# Patient Record
Sex: Male | Born: 1960
Health system: Southern US, Community
[De-identification: ages and names within clinical notes are randomized; demographics above are authoritative.]

## PROBLEM LIST (undated history)

## (undated) DIAGNOSIS — J45909 Unspecified asthma, uncomplicated: Secondary | ICD-10-CM

## (undated) DIAGNOSIS — E039 Hypothyroidism, unspecified: Secondary | ICD-10-CM

## (undated) DIAGNOSIS — M1990 Unspecified osteoarthritis, unspecified site: Secondary | ICD-10-CM

## (undated) DIAGNOSIS — M109 Gout, unspecified: Secondary | ICD-10-CM

## (undated) DIAGNOSIS — IMO0002 Reserved for concepts with insufficient information to code with codable children: Secondary | ICD-10-CM

## (undated) DIAGNOSIS — E785 Hyperlipidemia, unspecified: Secondary | ICD-10-CM

## (undated) DIAGNOSIS — I2699 Other pulmonary embolism without acute cor pulmonale: Secondary | ICD-10-CM

## (undated) DIAGNOSIS — E1165 Type 2 diabetes mellitus with hyperglycemia: Secondary | ICD-10-CM

## (undated) DIAGNOSIS — I10 Essential (primary) hypertension: Secondary | ICD-10-CM

## (undated) DIAGNOSIS — I5032 Chronic diastolic (congestive) heart failure: Secondary | ICD-10-CM

## (undated) HISTORY — DX: Type 2 diabetes mellitus with hyperglycemia: E11.65

## (undated) HISTORY — DX: Morbid (severe) obesity due to excess calories: E66.01

## (undated) HISTORY — DX: Reserved for concepts with insufficient information to code with codable children: IMO0002

## (undated) HISTORY — DX: Hyperlipidemia, unspecified: E78.5

## (undated) HISTORY — DX: Essential (primary) hypertension: I10

---

## 1997-11-25 ENCOUNTER — Emergency Department (HOSPITAL_COMMUNITY): Admission: EM | Admit: 1997-11-25 | Discharge: 1997-11-25 | Payer: Self-pay | Admitting: Emergency Medicine

## 1998-01-07 ENCOUNTER — Emergency Department (HOSPITAL_COMMUNITY): Admission: EM | Admit: 1998-01-07 | Discharge: 1998-01-07 | Payer: Self-pay | Admitting: Emergency Medicine

## 1998-02-27 ENCOUNTER — Encounter: Admission: RE | Admit: 1998-02-27 | Discharge: 1998-05-28 | Payer: Self-pay | Admitting: Family Medicine

## 1998-04-02 ENCOUNTER — Encounter: Admission: RE | Admit: 1998-04-02 | Discharge: 1998-07-01 | Payer: Self-pay | Admitting: Family Medicine

## 1998-04-09 ENCOUNTER — Encounter: Payer: Self-pay | Admitting: Emergency Medicine

## 1998-04-09 ENCOUNTER — Emergency Department (HOSPITAL_COMMUNITY): Admission: EM | Admit: 1998-04-09 | Discharge: 1998-04-09 | Payer: Self-pay | Admitting: Emergency Medicine

## 1998-05-27 ENCOUNTER — Encounter: Admission: RE | Admit: 1998-05-27 | Discharge: 1998-08-25 | Payer: Self-pay | Admitting: Family Medicine

## 1998-07-30 ENCOUNTER — Encounter: Admission: RE | Admit: 1998-07-30 | Discharge: 1998-10-28 | Payer: Self-pay | Admitting: Family Medicine

## 1998-10-29 ENCOUNTER — Encounter: Admission: RE | Admit: 1998-10-29 | Discharge: 1999-01-27 | Payer: Self-pay | Admitting: Family Medicine

## 1999-02-05 ENCOUNTER — Encounter: Admission: RE | Admit: 1999-02-05 | Discharge: 1999-05-06 | Payer: Self-pay | Admitting: Family Medicine

## 1999-05-26 ENCOUNTER — Encounter: Admission: RE | Admit: 1999-05-26 | Discharge: 1999-08-24 | Payer: Self-pay | Admitting: Family Medicine

## 1999-06-06 ENCOUNTER — Emergency Department (HOSPITAL_COMMUNITY): Admission: EM | Admit: 1999-06-06 | Discharge: 1999-06-06 | Payer: Self-pay

## 1999-11-03 ENCOUNTER — Encounter: Admission: RE | Admit: 1999-11-03 | Discharge: 2000-02-01 | Payer: Self-pay | Admitting: Family Medicine

## 2001-04-30 ENCOUNTER — Emergency Department (HOSPITAL_COMMUNITY): Admission: EM | Admit: 2001-04-30 | Discharge: 2001-04-30 | Payer: Self-pay | Admitting: *Deleted

## 2002-01-20 ENCOUNTER — Emergency Department (HOSPITAL_COMMUNITY): Admission: EM | Admit: 2002-01-20 | Discharge: 2002-01-20 | Payer: Self-pay | Admitting: Emergency Medicine

## 2002-03-13 ENCOUNTER — Emergency Department (HOSPITAL_COMMUNITY): Admission: EM | Admit: 2002-03-13 | Discharge: 2002-03-13 | Payer: Self-pay | Admitting: Emergency Medicine

## 2002-03-13 ENCOUNTER — Encounter: Payer: Self-pay | Admitting: Emergency Medicine

## 2005-06-20 ENCOUNTER — Emergency Department (HOSPITAL_COMMUNITY): Admission: EM | Admit: 2005-06-20 | Discharge: 2005-06-20 | Payer: Self-pay | Admitting: Emergency Medicine

## 2006-09-13 ENCOUNTER — Emergency Department (HOSPITAL_COMMUNITY): Admission: EM | Admit: 2006-09-13 | Discharge: 2006-09-13 | Payer: Self-pay | Admitting: Emergency Medicine

## 2007-01-08 ENCOUNTER — Inpatient Hospital Stay (HOSPITAL_COMMUNITY): Admission: EM | Admit: 2007-01-08 | Discharge: 2007-01-16 | Payer: Self-pay | Admitting: Emergency Medicine

## 2007-01-10 ENCOUNTER — Ambulatory Visit: Payer: Self-pay | Admitting: Physical Medicine & Rehabilitation

## 2007-04-11 ENCOUNTER — Encounter: Admission: RE | Admit: 2007-04-11 | Discharge: 2007-07-10 | Payer: Self-pay | Admitting: *Deleted

## 2008-10-27 ENCOUNTER — Emergency Department (HOSPITAL_COMMUNITY): Admission: EM | Admit: 2008-10-27 | Discharge: 2008-10-27 | Payer: Self-pay | Admitting: Emergency Medicine

## 2008-11-21 ENCOUNTER — Ambulatory Visit: Payer: Self-pay | Admitting: Vascular Surgery

## 2008-11-21 ENCOUNTER — Encounter (INDEPENDENT_AMBULATORY_CARE_PROVIDER_SITE_OTHER): Payer: Self-pay | Admitting: Emergency Medicine

## 2008-11-21 ENCOUNTER — Emergency Department (HOSPITAL_COMMUNITY): Admission: EM | Admit: 2008-11-21 | Discharge: 2008-11-21 | Payer: Self-pay | Admitting: Emergency Medicine

## 2009-05-19 ENCOUNTER — Emergency Department (HOSPITAL_COMMUNITY): Admission: EM | Admit: 2009-05-19 | Discharge: 2009-05-19 | Payer: Self-pay | Admitting: Emergency Medicine

## 2010-09-23 LAB — BASIC METABOLIC PANEL
BUN: 18 mg/dL (ref 6–23)
CO2: 24 mEq/L (ref 19–32)
Calcium: 9.5 mg/dL (ref 8.4–10.5)
Chloride: 104 mEq/L (ref 96–112)
Creatinine, Ser: 0.8 mg/dL (ref 0.4–1.5)
GFR calc Af Amer: >60 mL/min (ref >60)
GFR calc non Af Amer: >60 mL/min (ref >60)
Glucose, Bld: 97 mg/dL (ref 70–99)
Potassium: 4.3 mEq/L (ref 3.5–5.1)
Sodium: 137 mEq/L (ref 135–145)

## 2010-09-28 LAB — POCT I-STAT, CHEM 8
BUN: 16 mg/dL (ref 6–23)
Creatinine, Ser: 1.2 mg/dL (ref 0.4–1.5)
Sodium: 140 mEq/L (ref 135–145)
TCO2: 24 mmol/L (ref 0–100)

## 2010-09-28 LAB — URINALYSIS, ROUTINE W REFLEX MICROSCOPIC
Hgb urine dipstick: NEGATIVE
Protein, ur: NEGATIVE mg/dL
Specific Gravity, Urine: 1.024 (ref 1.005–1.030)
Urobilinogen, UA: 2 mg/dL — ABNORMAL HIGH (ref 0.0–1.0)

## 2010-09-28 LAB — CBC
HCT: 41.5 % (ref 39.0–52.0)
Hemoglobin: 13.7 g/dL (ref 13.0–17.0)
MCHC: 33 g/dL (ref 30.0–36.0)
MCV: 94.3 fL (ref 78.0–100.0)
RBC: 4.4 MIL/uL (ref 4.22–5.81)

## 2010-09-28 LAB — DIFFERENTIAL
Basophils Relative: 0 % (ref 0–1)
Eosinophils Absolute: 0.3 10*3/uL (ref 0.0–0.7)
Eosinophils Relative: 3 % (ref 0–5)
Monocytes Absolute: 0.7 10*3/uL (ref 0.1–1.0)
Monocytes Relative: 9 % (ref 3–12)

## 2010-11-03 NOTE — Discharge Summary (Signed)
NAME:  Ronnie Hernandez, Ronnie Hernandez             ACCOUNT NO.:  [id number]   MEDICAL RECORD NO.:  [id number]          PATIENT TYPE:  INP   LOCATION:  1540                         FACILITY:  [facility]   PHYSICIAN:  Elliot Cousin, M.D.    DATE OF BIRTH:  27-Jul-1960   DATE OF ADMISSION:  01/08/2007  DATE OF DISCHARGE:  01/16/2007                               DISCHARGE SUMMARY   DISCHARGE DIAGNOSES:  1. Acute on chronic gout exacerbation/polyarthropathy.  2. Superimposed degenerative joint disease particularly of the lumbar      spine and the left shoulder.  3. Morbid obesity with a decrease in mobility/progressive      deconditioning.  4. Type 2 diabetes mellitus, diagnosed during this hospitalization.      The patient's hemoglobin A1C is 6.3.  5. Acute asthmatic bronchitis.  6. Mild normocytic anemia.   DISCHARGE MEDICATIONS:  1. Prednisone 25 mg daily for 2 more days, then 15 mg for 3 days then      10 mg daily for 2 days then 5 mg daily thereafter.  2. Colchicine 0.6 mg b.i.d.  3. Aspercreme to the left shoulder t.i.d. for 2 days and then daily      p.r.n. thereafter.  4. Glucophage 500 mg daily.  5. Percocet 5 mg b.i.d. and then every 4 hours as needed for pain.  6. Senokot S 1 tablet nightly.  7. Protonix 40 mg daily.  8. Multivitamin with iron once daily.  9. Tylenol 650 mg every 4 hours as needed for pain and fever.  10.Albuterol MDI 2 puffs every 4 hours p.r.n.   CONSULTATIONS:  1. Rehabilitation Medicine physician, Dr. Thersa Salt.  2. Physical and Occupational Therapy.  3. Social Work Haematologist.   PROCEDURES PERFORMED:  X-ray of the left shoulder on 01/09/2007.  The  results revealed no acute skeletal abnormalities.  Degenerative changes  in the acromioclavicular joint.   HISTORY OF PRESENT ILLNESS:  The patient is a 50 year old man with a  past medical history significant for gout, asthma, and arthritis.  He  presented to the emergency department on 01/08/2007 with a chief  complaint of  diffuse musculoskeletal and joint pain, particularly of his  right knee, right foot, right ankle, left elbow, left hand and left  shoulder.  He attempted treatment with Naprosyn without any significant  relief.  Because of the pain, he had been virtually bedridden for  approximately 2 weeks prior to the hospitalization.  When he was  evaluated in the emergency department his uric acid level was elevated  at 7.8.  He was, therefore, admitted for further evaluation and  management.   For additional details, please see the dictated history and physical.   HOSPITAL COURSE:  1. ACUTE ON CHRONIC GOUT EXACERBATION SUPERIMPOSED ON CHRONIC      DEGENERATIVE JOINT DISEASE.  The patient was started on pain      management with colchicine 0.6 mg every 2 hours x5 doses and then      b.i.d. thereafter.  He was also started on Naprosyn 5 mg b.i.d. x3      doses.  As needed relief was provided  with oxycodone, Tylenol and      Dilaudid.  Over the course of the hospitalization, the patient's      pain and inflammation subsided.  However, he continued to have more      pain in his left hand and his left shoulder.  Given this complaint,      an x-ray of the left shoulder was ordered.  It revealed      degenerative changes but no acute findings.  Given the persistence      of the left shoulder and left hand pain, a prednisone taper was      started.  Approximately 24 hours after the prednisone taper was      started, the patient's pain in the left hand completely resolved.      Aspercreme to be applied t.i.d. for 3 days was added as well.  Over      the past 24 hours, he has had an increase in the range of motion of      the left shoulder.  For ongoing treatment of his gout and      degenerative joint disease, he will be discharged on colchicine 0.6      mg b.i.d., a prednisone taper down to 5 mg daily indefinitely, and      Aspercreme to the left shoulder p.r.n.  The patient will also be      chronically  treated with Percocet b.i.d. and every 4 hours as      needed for breakthrough pain.  2. DECONDITIONING/LIMITED MOBILITY SECONDARY TO MORBID OBESITY AND      POLYARTHROPATHY.  As stated above, the patient had been virtually      bedridden for approximately 2 weeks prior to the hospitalization.      For further evaluation, the physical and occupational therapists      were consulted.  They evaluated the patient and felt that he may      possibly be a good rehab candidate for the rehab facility at [facility].  Therefore, rehabilitation physician, Dr. Hermelinda Medicus,      was consulted.  He provided his consultation on 01/10/2007.  Per      his assessment, he would consider taking the patient as a rehab      candidate if he progressed over the next few days following his      assessment.  However, over the few days following, the patient's      mobility and progression have been somewhat slow.  He is able to      stand a little with the assistance of the therapist and a walker.      As an alternative, the patient was given the option of placement in      a skilled nursing facility, short term, for further rehabilitation      and strengthening.  The patient was very accepting of the idea.      [facility] (skilled nursing facility) showed some interest      in the patient.  The final acceptance is pending, however.  If he      is accepted, the patient will be discharged there for ongoing      physical and occupational therapy.  3. TYPE 2 DIABETES MELLITUS (NEWLY DIAGNOSED).  The patient's venous      glucose was found to be mildly elevated during the hospitalization.      His venous glucose generally ranged from 140-150 during the  hospitalization.  A hemoglobin A1C was ordered and was found to be      slightly elevated at 6.3.  The patient had no known prior history      of diabetes mellitus.  However, given the persistence of the      hyperglycemia, Glucophage at 500 mg  daily was started.  The patient      was also treated with a sliding scale insulin regimen with NovoLog.      However, he probably will not need insulin treatment at this point.      The Glucophage can certainly be titrated up to 1000 mg or 500 mg      b.i.d.  However, he appears to be relatively well controlled on 500      mg of Glucophage daily.  The registered dietitian was consulted and      provided the patient with information regarding weight loss and a      diabetic diet and lifestyle. The patient was also educated on self      glucose monitoring.  4. MILD EXACERBATION OF ASTHMATIC BRONCHITIS.  The patient was started      on as-needed Albuterol MDI and doxycycline empirically.  Over the      past 2-3 days, he has had no wheezes and no other pulmonary      symptoms.  He has been completely afebrile during the hospital      course.   DISCHARGE DISPOSITION:  The patient is currently stable and in improved  condition with regard to acute gout symptoms.  He is currently  deconditioned and has a significant decrease in his mobility.  The plan  is to transfer him to a skilled nursing facility for ongoing  rehabilitation.   DISCHARGE LABORATORIES:  TSH 5.4.  Sodium 136, potassium 3.9, chloride  100, CO2 27, glucose 150, BUN 25, creatinine 0.8, calcium 9.1.  WBC  14.5, hemoglobin 13.1, hematocrit 38.6, platelet count 478.   Reticulocyte count 44, total iron 56, total iron binding capacity 190,  vitamin B12 338, folate 6.7 and ferritin 905.      Elliot Cousin, M.D.  Electronically Signed     DF/MEDQ  D:  01/15/2007  T:  01/15/2007  Job:  [id number]

## 2010-11-03 NOTE — H&P (Signed)
NAME:  FARON, TUDISCO NO.:  [id number]   MEDICAL RECORD NO.:  [id number]          PATIENT TYPE:  INP   LOCATION:  1540                         FACILITY:  [facility]   PHYSICIAN:  Hettie Holstein, D.O.    DATE OF BIRTH:  03-28-1961   DATE OF ADMISSION:  01/08/2007  DATE OF DISCHARGE:                              HISTORY & PHYSICAL   PRIMARY CARE PHYSICIAN:  Unassigned.   CHIEF COMPLAINT:  Severe joint pains.   HISTORY OF PRESENTING ILLNESS:  Mr. Brame is a 50 year old morbidly  obese male with significant past medical history for gout and a similar  presentation of the gouty flare in the past, with similar constellation  of complaints this admission.  He states that he began having symptoms  approximately for 2 weeks that began in his left arm and had some  improvement with his typical nonsteroidals that he takes; however, he  subsequently developed a pain in his right knee, right foot and ankle  with some involvement of his left elbow and he states this is very  similar to previous course and bout with gout.  He, as noted above,  attempted Naprosyn without any significant relief.  He has essentially  been a confined to his bed for almost 2 weeks now.  He is tended to by  his niece, Glee Arvin, reachable at [phone].  His uric acid level in the  emergency room was elevated at 7.8.  He typically takes Naprosyn at  home.  He does not have colchicine or allopurinol.   PAST MEDICAL HISTORY:  1. This is significant for bouts of gout in the past and history of      morbid obesity.  He had been ambulatory up until about 2 weeks ago      and has been depending on his family for activities of daily      living.  2. History of arthritis.  3. Asthma.   MEDICATIONS:  He has no prescribed medications; he only takes over-the-  counter Naprosyn, albuterol and Tylenol.   ALLERGIES:  He has no known drug allergies.   SOCIAL HISTORY:  He denies tobacco or alcohol.  He states that  he has  been on disability.  He lives with his niece, as his primary care  person.  His family is reachable at [phone] or [phone].   FAMILY HISTORY:  Noncontributory.   REVIEW OF SYSTEMS:  Apparently, according to Mr. Fogelman, he had been in  his usual state of health up until about 2 weeks ago.  He is able  transport from home to car via a personal scooter-type vehicle.  In any  event, he has not had any complaints of nausea, vomiting or diarrhea.  He denies chest pain or discomfort.  He has had multiple joint  complaints as described above.  No blood in his stools.  No hematemesis  or hematochezia.   PHYSICAL EXAMINATION:  VITAL SIGNS:  In the emergency department, his  blood pressure was 160/67, temperature 97, heart rate 98, respirations  20.  O2 saturation is 98%.  GENERAL:  Mr. Rudd does not  appear to be in acute respiratory  distress.  He is quite uncomfortable-appearing.  He is morbidly obese.  HEENT:  Reveals head to be normocephalic, atraumatic.  Extraocular  muscles are intact.  NECK:  Supple and nontender.  No palpable thyromegaly or mass.  CARDIOVASCULAR:  Exam reveals normal S1 and S2, without appreciable  murmur.  LUNGS:  He exhibited normal effort.  There were breath sounds  bilaterally, though auscultation was limited due to massive body  habitus.  ABDOMEN:  This is obese.  He does have a large pannus with areas of  redness where there were adjacent skin surfaces.  LOWER EXTREMITIES:  He  has tenderness with joint manipulation of his right lower extremity at  the ankle and knee.  He does have some tenderness with respect to his  left wrist and left shoulder.  He appears to have a swan-neck-type  deformity in his fingers.   LABORATORY DATA:  His urinalysis is negative.  BNP is less than 30.  Uric acid was 7.8.  Sodium is 136, potassium 5.1, BUN 20, creatinine  1.12, calcium 9.1.  WBC of 6.8, hemoglobin of 13.8 and platelet count of  445,000.   ASSESSMENT:   1. Polyarthropathy/__________ arthropathy attributable to an acute      gouty flare that is consistent with a previous constellation of      symptoms associated with prior bout gout.  2. Leukocytosis.  3. Morbid obesity.  4. Hyperglycemia.   PLAN:  At this time, Mr. Tiedt will be admitted for symptom management  and we will initiate colchicine as well as nonsteroidal anti-  inflammatories and narcotic analgesia, if his pain necessitates.  Otherwise, we will obtain a nutrition consult to assist in moderation  and management with respect to his diet.      Hettie Holstein, D.O.  Electronically Signed     ESS/MEDQ  D:  01/08/2007  T:  01/09/2007  Job:  [id number]

## 2011-04-05 LAB — IRON AND TIBC
Iron: 56
Saturation Ratios: 29
TIBC: 190 — ABNORMAL LOW
UIBC: 134

## 2011-04-05 LAB — CBC
HCT: 34.1 — ABNORMAL LOW
HCT: 38.6 — ABNORMAL LOW
Hemoglobin: 11.7 — ABNORMAL LOW
MCHC: 33.5
MCHC: 34.2
MCV: 88
MCV: 89.5
Platelets: 377
Platelets: 445 — ABNORMAL HIGH
Platelets: 455 — ABNORMAL HIGH
RBC: 3.87 — ABNORMAL LOW
RBC: 3.87 — ABNORMAL LOW
RBC: 4.31
RDW: 14
WBC: 13.1 — ABNORMAL HIGH
WBC: 14.5 — ABNORMAL HIGH
WBC: 16.8 — ABNORMAL HIGH
WBC: 9

## 2011-04-05 LAB — BASIC METABOLIC PANEL
BUN: 19
BUN: 20
CO2: 29
Calcium: 9
Calcium: 9.1
Chloride: 100
Creatinine, Ser: 0.77
Creatinine, Ser: 1.12
GFR calc Af Amer: >60
GFR calc Af Amer: >60
GFR calc non Af Amer: >60
GFR calc non Af Amer: >60
Potassium: 3.9
Potassium: 5.1
Sodium: 136

## 2011-04-05 LAB — URINE MICROSCOPIC-ADD ON

## 2011-04-05 LAB — BASIC METABOLIC PANEL WITH GFR
BUN: 18
CO2: 24
Calcium: 9
Chloride: 101
Creatinine, Ser: 0.64
GFR calc Af Amer: >60
GFR calc non Af Amer: >60
Glucose, Bld: 154 — ABNORMAL HIGH
Potassium: 4.3
Sodium: 135

## 2011-04-05 LAB — TSH: TSH: 5.485

## 2011-04-05 LAB — URINALYSIS, ROUTINE W REFLEX MICROSCOPIC
Glucose, UA: NEGATIVE
Protein, ur: 30 — AB

## 2011-04-05 LAB — DIFFERENTIAL
Lymphocytes Relative: 7 — ABNORMAL LOW
Lymphs Abs: 1.2
Neutrophils Relative %: 85 — ABNORMAL HIGH

## 2011-04-05 LAB — HEPARIN ANTI-XA: Heparin LMW: 0.45

## 2011-04-05 LAB — FERRITIN: Ferritin: 905 — ABNORMAL HIGH (ref 22–322)

## 2011-04-05 LAB — URIC ACID: Uric Acid, Serum: 7.8 — ABNORMAL HIGH

## 2011-04-05 LAB — HEMOGLOBIN A1C: Hgb A1c MFr Bld: 6.3 — ABNORMAL HIGH

## 2011-04-05 LAB — VITAMIN B12: Vitamin B-12: 338 (ref 211–911)

## 2011-04-05 LAB — FOLATE: Folate: 6.7

## 2011-04-05 LAB — B-NATRIURETIC PEPTIDE (CONVERTED LAB): Pro B Natriuretic peptide (BNP): <30

## 2011-04-05 LAB — RETICULOCYTES: Retic Count, Absolute: 44

## 2011-05-31 ENCOUNTER — Encounter: Payer: Self-pay | Admitting: Internal Medicine

## 2011-06-23 ENCOUNTER — Ambulatory Visit (AMBULATORY_SURGERY_CENTER): Payer: Medicare Other | Admitting: *Deleted

## 2011-06-23 VITALS — Ht 68.0 in | Wt >= 6400 oz

## 2011-06-23 DIAGNOSIS — Z1211 Encounter for screening for malignant neoplasm of colon: Secondary | ICD-10-CM

## 2011-06-23 MED ORDER — PEG-KCL-NACL-NASULF-NA ASC-C 100 G PO SOLR: ORAL | Status: DC

## 2011-06-23 NOTE — Progress Notes (Signed)
Pt weighs 412 lb.  Will be rescheduled to hospital.  Dr Rhea Belton wants pt to be on 2 days clear liquids before procedure.  Pt given instructions for 2 days of clear liquids and MoviPrep day before and day of procedure.  Ezra Sites'

## 2011-07-07 ENCOUNTER — Other Ambulatory Visit: Payer: Self-pay | Admitting: Internal Medicine

## 2011-07-23 HISTORY — PX: COLONOSCOPY W/ BIOPSIES AND POLYPECTOMY: SHX1376

## 2011-07-26 ENCOUNTER — Other Ambulatory Visit: Payer: Self-pay | Admitting: Gastroenterology

## 2011-07-26 ENCOUNTER — Telehealth: Payer: Self-pay | Admitting: Internal Medicine

## 2011-07-26 ENCOUNTER — Other Ambulatory Visit: Payer: Self-pay

## 2011-07-26 ENCOUNTER — Ambulatory Visit (HOSPITAL_COMMUNITY)
Admission: RE | Admit: 2011-07-26 | Discharge: 2011-07-26 | Disposition: A | Discharge status: Home / Self Care | Payer: Medicare Other | Source: Ambulatory Visit | Attending: Internal Medicine | Admitting: Internal Medicine

## 2011-07-26 ENCOUNTER — Encounter (HOSPITAL_COMMUNITY): Payer: Self-pay

## 2011-07-26 DIAGNOSIS — Z1211 Encounter for screening for malignant neoplasm of colon: Secondary | ICD-10-CM

## 2011-07-26 DIAGNOSIS — I10 Essential (primary) hypertension: Secondary | ICD-10-CM | POA: Insufficient documentation

## 2011-07-26 DIAGNOSIS — E785 Hyperlipidemia, unspecified: Secondary | ICD-10-CM | POA: Insufficient documentation

## 2011-07-26 DIAGNOSIS — Z01810 Encounter for preprocedural cardiovascular examination: Secondary | ICD-10-CM | POA: Insufficient documentation

## 2011-07-26 DIAGNOSIS — E119 Type 2 diabetes mellitus without complications: Secondary | ICD-10-CM | POA: Insufficient documentation

## 2011-07-26 HISTORY — DX: Unspecified osteoarthritis, unspecified site: M19.90

## 2011-07-26 MED ORDER — PEG-KCL-NACL-NASULF-NA ASC-C 100 G PO SOLR: ORAL | Status: DC

## 2011-07-26 NOTE — Telephone Encounter (Signed)
Tried calling pt to instruct him on his prep. Phone number has been temporarily disconnected.

## 2011-07-26 NOTE — Anesthesia Preprocedure Evaluation (Addendum)
Anesthesia Evaluation  Patient identified by MRN, date of birth, ID band Patient awake    Reviewed: Allergy & Precautions, H&P , NPO status , Patient's Chart, lab work & pertinent test results  Airway Mallampati: III TM Distance: >3 FB Neck ROM: Full    Dental No notable dental hx.    Pulmonary neg pulmonary ROS,  clear to auscultation  Pulmonary exam normal       Cardiovascular hypertension, Pt. on medications Regular Normal    Neuro/Psych Negative Neurological ROS  Negative Psych ROS   GI/Hepatic negative GI ROS, Neg liver ROS,   Endo/Other  Diabetes mellitus-, Type 2, Oral Hypoglycemic AgentsMorbid obesity  Renal/GU negative Renal ROS  Genitourinary negative   Musculoskeletal negative musculoskeletal ROS (+)   Abdominal   Peds negative pediatric ROS (+)  Hematology negative hematology ROS (+)   Anesthesia Other Findings   Reproductive/Obstetrics negative OB ROS                           Anesthesia Physical Anesthesia Plan  ASA: III  Anesthesia Plan: MAC   Post-op Pain Management:    Induction: Intravenous  Airway Management Planned: Simple Face Mask  Additional Equipment:   Intra-op Plan:   Post-operative Plan:   Informed Consent: I have reviewed the patients History and Physical, chart, labs and discussed the procedure including the risks, benefits and alternatives for the proposed anesthesia with the patient or authorized representative who has indicated his/her understanding and acceptance.   Dental advisory given  Plan Discussed with: CRNA  Anesthesia Plan Comments:         Anesthesia Quick Evaluation

## 2011-07-27 ENCOUNTER — Encounter (HOSPITAL_COMMUNITY): Payer: Self-pay | Admitting: Internal Medicine

## 2011-07-27 ENCOUNTER — Encounter (HOSPITAL_COMMUNITY): Payer: Self-pay | Admitting: Anesthesiology

## 2011-07-27 ENCOUNTER — Ambulatory Visit (HOSPITAL_COMMUNITY): Payer: Medicare Other | Admitting: Anesthesiology

## 2011-07-27 ENCOUNTER — Encounter (HOSPITAL_COMMUNITY): Payer: Self-pay | Admitting: *Deleted

## 2011-07-27 ENCOUNTER — Ambulatory Visit (HOSPITAL_COMMUNITY)
Admission: RE | Admit: 2011-07-27 | Discharge: 2011-07-27 | Disposition: A | Discharge status: Home / Self Care | Payer: Medicare Other | Source: Ambulatory Visit | Attending: Internal Medicine | Admitting: Internal Medicine

## 2011-07-27 ENCOUNTER — Other Ambulatory Visit: Payer: Self-pay | Admitting: Internal Medicine

## 2011-07-27 ENCOUNTER — Encounter (HOSPITAL_COMMUNITY)
Admission: RE | Disposition: A | Discharge status: Home / Self Care | Payer: Self-pay | Source: Ambulatory Visit | Attending: Internal Medicine

## 2011-07-27 DIAGNOSIS — I10 Essential (primary) hypertension: Secondary | ICD-10-CM | POA: Insufficient documentation

## 2011-07-27 DIAGNOSIS — D371 Neoplasm of uncertain behavior of stomach: Secondary | ICD-10-CM | POA: Insufficient documentation

## 2011-07-27 DIAGNOSIS — E785 Hyperlipidemia, unspecified: Secondary | ICD-10-CM | POA: Insufficient documentation

## 2011-07-27 DIAGNOSIS — E119 Type 2 diabetes mellitus without complications: Secondary | ICD-10-CM | POA: Insufficient documentation

## 2011-07-27 DIAGNOSIS — D375 Neoplasm of uncertain behavior of rectum: Secondary | ICD-10-CM | POA: Insufficient documentation

## 2011-07-27 DIAGNOSIS — D126 Benign neoplasm of colon, unspecified: Secondary | ICD-10-CM

## 2011-07-27 DIAGNOSIS — Z1211 Encounter for screening for malignant neoplasm of colon: Secondary | ICD-10-CM | POA: Insufficient documentation

## 2011-07-27 SURGERY — COLONOSCOPY WITH PROPOFOL
Anesthesia: Monitor Anesthesia Care

## 2011-07-27 MED ORDER — LACTATED RINGERS IV SOLN
INTRAVENOUS | Status: DC
  Administered 2011-07-27: 09:00:00 via INTRAVENOUS

## 2011-07-27 MED ORDER — PROPOFOL 10 MG/ML IV EMUL
INTRAVENOUS | Status: DC | PRN
  Administered 2011-07-27: 50 ug/kg/min via INTRAVENOUS

## 2011-07-27 MED ORDER — MIDAZOLAM HCL 5 MG/5ML IJ SOLN
INTRAMUSCULAR | Status: DC | PRN
Start: 2011-07-27 — End: 2011-07-27
  Administered 2011-07-27 (×3): 1 mg via INTRAVENOUS

## 2011-07-27 MED ORDER — PROMETHAZINE HCL 25 MG/ML IJ SOLN: 6.2500 mg | INTRAMUSCULAR | Status: DC | PRN

## 2011-07-27 MED ORDER — METOCLOPRAMIDE HCL 5 MG/ML IJ SOLN
INTRAMUSCULAR | Status: DC | PRN
  Administered 2011-07-27: 5 mg via INTRAVENOUS

## 2011-07-27 MED ORDER — FENTANYL CITRATE 0.05 MG/ML IJ SOLN
INTRAMUSCULAR | Status: DC | PRN
  Administered 2011-07-27 (×3): 50 ug via INTRAVENOUS

## 2011-07-27 MED ORDER — MEPERIDINE HCL 25 MG/ML IJ SOLN: 6.2500 mg | INTRAMUSCULAR | Status: DC | PRN

## 2011-07-27 NOTE — Anesthesia Postprocedure Evaluation (Signed)
  Anesthesia Post-op Note  Patient: Ronnie Hernandez  Procedure(s) Performed:  COLONOSCOPY WITH PROPOFOL  Patient Location: PACU  Anesthesia Type: MAC  Level of Consciousness: awake and alert   Airway and Oxygen Therapy: Patient Spontanous Breathing  Post-op Pain: mild  Post-op Assessment: Post-op Vital signs reviewed, Patient's Cardiovascular Status Stable, Respiratory Function Stable, Patent Airway and No signs of Nausea or vomiting  Post-op Vital Signs: stable  Complications: No apparent anesthesia complications

## 2011-07-27 NOTE — Transfer of Care (Signed)
Immediate Anesthesia Transfer of Care Note  Patient: Ronnie Hernandez  Procedure(s) Performed:  COLONOSCOPY WITH PROPOFOL  Patient Location: PACU  Anesthesia Type: MAC  Level of Consciousness: awake, alert , oriented and patient cooperative  Airway & Oxygen Therapy: Patient Spontanous Breathing and Patient connected to face mask oxygen  Post-op Assessment: Report given to PACU RN and Post -op Vital signs reviewed and stable  Post vital signs: Reviewed and stable  Complications: No apparent anesthesia complications

## 2011-07-27 NOTE — Transfer of Care (Signed)
Immediate Anesthesia Transfer of Care Note  Patient: Ronnie Hernandez  Procedure(s) Performed:  COLONOSCOPY WITH PROPOFOL  Patient Location: PACU  Anesthesia Type: MAC  Level of Consciousness: awake, alert , oriented and patient cooperative  Airway & Oxygen Therapy: Patient Spontanous Breathing and Patient connected to face mask oxygen  Post-op Assessment: Report given to PACU RN, Post -op Vital signs reviewed and stable and Patient moving all extremities  Post vital signs: Reviewed and stable  Complications: No apparent anesthesia complications

## 2011-07-27 NOTE — H&P (Signed)
  HPI: Ronnie Hernandez is a 51 yo male with PMH of morbid obesity, HTN, DM who presents to [facility] for screening colonoscopy.  No complaints today.  No abd pain, n/v.  No f/c/ns.  Past Medical History  Diagnosis Date  . Hypertension   . Hyperlipidemia   . Diabetes mellitus   . Morbidly obese   . Arthritis     right fingers, both knees   Current Facility-Administered Medications  Medication Dose Route Frequency Provider Last Rate Last Dose  . lactated ringers infusion   Intravenous Continuous Phillips Grout, MD 125 mL/hr at 07/27/11 [phone]    . meperidine (DEMEROL) injection 6.25-12.5 mg  6.25-12.5 mg Intravenous Q5 min PRN Phillips Grout, MD      . promethazine (PHENERGAN) injection 6.25-12.5 mg  6.25-12.5 mg Intravenous Q15 min PRN Phillips Grout, MD       No Known Allergies  Family History  Problem Relation Age of Onset  . Stomach cancer Maternal Aunt 80  . Colon cancer Maternal Grandfather 45  . Heart attack Father    History   Social History  . Marital Status: Single    Spouse Name: N/A    Number of Children: N/A  . Years of Education: N/A   Social History Main Topics  . Smoking status: Former Smoker    Quit date: 02/09/1982  . Smokeless tobacco: Never Used  . Alcohol Use: No  . Drug Use: No  . Sexually Active: None   Other Topics Concern  . None   Social History Narrative  . None    ROS: as per HPI,otherwise neg  BP 135/86  Temp(Src) 97.8 F (36.6 C) (Oral)  Resp 20  SpO2 97% Gen: awake, alert, NAD HEENT: anicteric, op clear CV: RRR, no mrg Pulm: CTA b/l, distant Abd: soft, obese, NT/ND, +BS throughout JWJ:XBJYN pretib edema, no c/c, changes of arthritis b/l hands Neuro: nonfocal  [name] with PMH of morbid obesity, HTN, DM who presents to [facility] for screening colonoscopy  1. CRC screening -- plan colonoscopy today with MAC sedation.

## 2011-07-27 NOTE — Op Note (Signed)
[facility] [address] [place], [place]  [address]  COLONOSCOPY PROCEDURE REPORT  PATIENT:  Ronnie Hernandez, Ronnie Hernandez  MR#:  [id number] BIRTHDATE:  1961/03/15, 50 yrs. old  GENDER:  male ENDOSCOPIST:  Carie Caddy. Romy Mcgue, MD REF. BY:  Baltazar Najjar, M.D. PROCEDURE DATE:  07/27/2011 PROCEDURE:  Colonoscopy with snare polypectomy, Colon with cold biopsy polypectomy ASA CLASS:  Class II INDICATIONS:  Routine Risk Screening, 1st colonoscopy MEDICATIONS:   MAC sedation, administered by CRNA  DESCRIPTION OF PROCEDURE:   After the risks benefits and alternatives of the procedure were thoroughly explained, informed consent was obtained.  Digital rectal exam was performed and revealed no rectal masses.   The [id number] and [id number]) endoscope was introduced through the anus and advanced to the cecum, which was identified by both the appendix and ileocecal valve, without limitations.  The quality of the prep was good, using MoviPrep.  The instrument was then slowly withdrawn as the colon was fully examined. <<PROCEDUREIMAGES>> FINDINGS:  A  5 mm sessile polyp was found in the cecum. Polyp was snared without cautery. Retrieval was successful.   A 2 mm sessile polyp was found in the cecum. The polyp was removed using cold biopsy forceps.  This was otherwise a normal examination of the colon.   Retroflexed views in the rectum revealed no abnormalities.   The scope was then withdrawn from the cecum and the procedure completed.  COMPLICATIONS:  None  ENDOSCOPIC IMPRESSION: 1) Sessile polyp in the cecum.  Removed and sent to pathology. 2) Sessile polyp in the cecum. Removed and sent to pathology. 3) Otherwise normal examination  RECOMMENDATIONS: 1) Await pathology results 2) If the polyps removed today are proven to be adenomatous (pre-cancerous) polyps, you will need a repeat colonoscopy in 5 years. Otherwise you should continue to follow colorectal cancer screening guidelines  for "routine risk" patients with colonoscopy in 10 years. 3) You will receive a letter within 1-2 weeks with the results of your biopsy as well as final recommendations. Please call my office if you have not received a letter after 3 weeks.  Carie Caddy. Rhea Belton, MD  CC:  Baltazar Najjar, MD The Patient  n. eSIGNED:   Carie Caddy. Birch Farino at 07/27/2011 09:34 AM  Mosetta Putt, [phone]

## 2011-07-27 NOTE — Preoperative (Signed)
Beta Blockers   Reason not to administer Beta Blockers:Not Applicable 

## 2011-07-28 ENCOUNTER — Encounter: Payer: Self-pay | Admitting: Internal Medicine

## 2012-11-15 ENCOUNTER — Emergency Department (HOSPITAL_COMMUNITY)
Admission: EM | Admit: 2012-11-15 | Discharge: 2012-11-15 | Disposition: A | Discharge status: Home / Self Care | Payer: Medicare Other | Attending: Emergency Medicine | Admitting: Emergency Medicine

## 2012-11-15 ENCOUNTER — Encounter (HOSPITAL_COMMUNITY): Payer: Self-pay | Admitting: Emergency Medicine

## 2012-11-15 DIAGNOSIS — M7989 Other specified soft tissue disorders: Secondary | ICD-10-CM | POA: Insufficient documentation

## 2012-11-15 DIAGNOSIS — E119 Type 2 diabetes mellitus without complications: Secondary | ICD-10-CM | POA: Insufficient documentation

## 2012-11-15 DIAGNOSIS — Z8639 Personal history of other endocrine, nutritional and metabolic disease: Secondary | ICD-10-CM | POA: Insufficient documentation

## 2012-11-15 DIAGNOSIS — R110 Nausea: Secondary | ICD-10-CM | POA: Insufficient documentation

## 2012-11-15 DIAGNOSIS — I10 Essential (primary) hypertension: Secondary | ICD-10-CM | POA: Insufficient documentation

## 2012-11-15 DIAGNOSIS — Z79899 Other long term (current) drug therapy: Secondary | ICD-10-CM | POA: Insufficient documentation

## 2012-11-15 DIAGNOSIS — Z862 Personal history of diseases of the blood and blood-forming organs and certain disorders involving the immune mechanism: Secondary | ICD-10-CM | POA: Insufficient documentation

## 2012-11-15 DIAGNOSIS — Z8739 Personal history of other diseases of the musculoskeletal system and connective tissue: Secondary | ICD-10-CM | POA: Insufficient documentation

## 2012-11-15 DIAGNOSIS — R197 Diarrhea, unspecified: Secondary | ICD-10-CM | POA: Insufficient documentation

## 2012-11-15 DIAGNOSIS — Z87891 Personal history of nicotine dependence: Secondary | ICD-10-CM | POA: Insufficient documentation

## 2012-11-15 DIAGNOSIS — M79609 Pain in unspecified limb: Secondary | ICD-10-CM | POA: Insufficient documentation

## 2012-11-15 LAB — URINE MICROSCOPIC-ADD ON

## 2012-11-15 LAB — CBC WITH DIFFERENTIAL/PLATELET
Basophils Relative: 0 % (ref 0–1)
Eosinophils Absolute: 0.2 10*3/uL (ref 0.0–0.7)
Eosinophils Relative: 2 % (ref 0–5)
Lymphs Abs: 2.2 10*3/uL (ref 0.7–4.0)
MCH: 30.8 pg (ref 26.0–34.0)
MCHC: 34.1 g/dL (ref 30.0–36.0)
MCV: 90.3 fL (ref 78.0–100.0)
Platelets: 176 10*3/uL (ref 150–400)
RBC: 4.64 MIL/uL (ref 4.22–5.81)
RDW: 12.8 % (ref 11.5–15.5)

## 2012-11-15 LAB — COMPREHENSIVE METABOLIC PANEL
ALT: 32 U/L (ref 0–53)
Calcium: 9.1 mg/dL (ref 8.4–10.5)
GFR calc Af Amer: 84 mL/min — ABNORMAL LOW (ref >90)
Glucose, Bld: 103 mg/dL — ABNORMAL HIGH (ref 70–99)
Sodium: 135 mEq/L (ref 135–145)
Total Protein: 7.2 g/dL (ref 6.0–8.3)

## 2012-11-15 LAB — URINALYSIS, ROUTINE W REFLEX MICROSCOPIC
Nitrite: NEGATIVE
Specific Gravity, Urine: 1.013 (ref 1.005–1.030)
pH: 5.5 (ref 5.0–8.0)

## 2012-11-15 MED ORDER — HYDROMORPHONE HCL PF 1 MG/ML IJ SOLN
1.0000 mg | Freq: Once | INTRAMUSCULAR | Status: AC
  Administered 2012-11-15: 1 mg via INTRAMUSCULAR
  Filled 2012-11-15: qty 1

## 2012-11-15 MED ORDER — KETOROLAC TROMETHAMINE 60 MG/2ML IM SOLN
60.0000 mg | Freq: Once | INTRAMUSCULAR | Status: AC
  Administered 2012-11-15: 60 mg via INTRAMUSCULAR
  Filled 2012-11-15: qty 2

## 2012-11-15 MED ORDER — IBUPROFEN 600 MG PO TABS: 600.0000 mg | ORAL_TABLET | Freq: Four times a day (QID) | ORAL | Status: DC | PRN

## 2012-11-15 NOTE — ED Provider Notes (Signed)
Complains of right hand pain for 4 days. Cone by swelling also complains of nausea and diarrhea no vomiting states hand pain is similar to down he episodes he's had in the past. Denies abdominal pain. Denies fever exam alert nontoxic not acutely ill appearing lungs clear auscultation heart regular rate and rhythm abdomen morbidly obese nontender bilateral lower extremities with 1+ pretibial pitting edema right upper extremity swollen dorsum of hand with redness, minimal tenderness. Not warm. No red cheeks up arm no axillary nodes. Radial pulse 2+. Assessment patient states hand is improving with time. Doubt cellulitis or septic joint  Doug Sou, MD 11/15/12 2100

## 2012-11-15 NOTE — ED Provider Notes (Signed)
History     CSN: [id number]  Arrival date & time 11/15/12  1814   First MD Initiated Contact with Patient 11/15/12 1840      Chief Complaint  Patient presents with  . Hand Pain  . Abdominal Pain  . Diarrhea    HPI 52 year old male with a history of morbid obesity (weight more than 400 pounds), diabetes, and gout as well as a degenerative arthritis presents complaining of right hand pain. Pain in his right hand started about 4 days ago. There is extensive soft tissue swelling of his entire right hand and his right distal forearm. He has noted some erythema to the affected area. He states that it started in his right fifth finger at approximately the MCP joint. It is gradually spread from there. Pain is severe, 8/10 in severity. The pain is aggravated by palpation.  It is relieved by being still and relieved somewhat by anti-inflammatory drugs. He states it is identical to prior gout flares that he has had. He states he's had arthrocentesis proven gout in the past affecting the same joints. He denies any fever, chills, nausea or other systemic symptoms.  On ROS he also has mild, cramping abdominal pain and a few episodes of loose stools. This has been ongoing for 2 days and his symptoms are mild and not why he came to the emergency department today.   Past Medical History  Diagnosis Date  . Hypertension   . Hyperlipidemia   . Diabetes mellitus   . Morbidly obese   . Arthritis     right fingers, both knees    Past Surgical History  Procedure Laterality Date  . Unremarkable      Family History  Problem Relation Age of Onset  . Stomach cancer Maternal Aunt 80  . Colon cancer Maternal Grandfather 60  . Heart attack Father     History  Substance Use Topics  . Smoking status: Former Smoker    Quit date: 02/09/1982  . Smokeless tobacco: Never Used  . Alcohol Use: No      Review of Systems  Constitutional: Negative for fever and chills.  HENT: Negative for congestion,  rhinorrhea, neck pain and neck stiffness.   Eyes: Negative for visual disturbance.  Respiratory: Negative for cough and shortness of breath.   Cardiovascular: Negative for chest pain and leg swelling.  Gastrointestinal: Positive for nausea and diarrhea. Negative for vomiting.  Genitourinary: Negative for dysuria, urgency, frequency, flank pain and difficulty urinating.  Musculoskeletal: Negative for back pain.  Skin: Negative for rash.  Neurological: Negative for syncope, weakness, numbness and headaches.  All other systems reviewed and are negative.    Allergies  Review of patient's allergies indicates no known allergies.  Home Medications   Current Outpatient Rx  Name  Route  Sig  Dispense  Refill  . allopurinol (ZYLOPRIM) 300 MG tablet   Oral   Take 300 mg by mouth 2 (two) times daily as needed (for gout).          . metFORMIN (GLUCOPHAGE) 500 MG tablet   Oral   Take 500 mg by mouth 2 (two) times daily with a meal.          . ibuprofen (ADVIL,MOTRIN) 600 MG tablet   Oral   Take 1 tablet (600 mg total) by mouth every 6 (six) hours as needed for pain.   30 tablet   0     BP 133/72  Pulse 81  Temp(Src) 99.7 F (37.6 C) (Oral)  Resp 18  SpO2 95%  Physical Exam  Nursing note and vitals reviewed. Constitutional: He is oriented to person, place, and time. He appears well-developed and well-nourished. No distress.  HENT:  Head: Normocephalic and atraumatic.  Mouth/Throat: Oropharynx is clear and moist.  Eyes: Conjunctivae and EOM are normal. Pupils are equal, round, and reactive to light. No scleral icterus.  Neck: Normal range of motion. Neck supple. No JVD present.  Cardiovascular: Normal rate, regular rhythm, normal heart sounds and intact distal pulses.  Exam reveals no gallop and no friction rub.   No murmur heard. Pulmonary/Chest: Effort normal and breath sounds normal. No respiratory distress. He has no wheezes. He has no rales.  Abdominal: Soft. Bowel  sounds are normal. He exhibits no distension. There is no tenderness. There is no rebound and no guarding.  Musculoskeletal: He exhibits no edema.  Moderate swelling to the dorsum and palmar surface of the right hand. The skin is mildly erythematous over the dorsum of the hand. He has swan neck deformity of multiple fingers of the affected hand. Skin is not warm to touch. Passive range of motion of all fingers and wrist is not painful. It is a 2+ radial pulse.  Neurological: He is alert and oriented to person, place, and time. No cranial nerve deficit. He exhibits normal muscle tone. Coordination normal.  Skin: Skin is warm and dry. He is not diaphoretic.    ED Course  Procedures (including critical care time)  Labs Reviewed  COMPREHENSIVE METABOLIC PANEL - Abnormal; Notable for the following:    Glucose, Bld 103 (*)    Albumin 2.7 (*)    Alkaline Phosphatase 118 (*)    GFR calc non Af Amer 72 (*)    GFR calc Af Amer 84 (*)    All other components within normal limits  URINALYSIS, ROUTINE W REFLEX MICROSCOPIC - Abnormal; Notable for the following:    Hgb urine dipstick SMALL (*)    Leukocytes, UA MODERATE (*)    All other components within normal limits  URINE MICROSCOPIC-ADD ON - Abnormal; Notable for the following:    Squamous Epithelial / LPF FEW (*)    Bacteria, UA FEW (*)    All other components within normal limits  URINE CULTURE  CBC WITH DIFFERENTIAL   No results found.   1. Hand swelling       MDM  52 year old male with morbid obesity and hypertension and diabetes presents complaining of right hand swelling and pain. States it is identical to gout flares he has had in the past. He states that he has had arthrocentesis proven gout in his wrist and fingers before. There is mild erythema, no warmth. Exam demonstrates moderate edema, good pulses, good sensation, chronic degenerative deformity, nonpainful passive range of motion at all fingers and wrist.   labs sent from  triage include a CMP, urinalysis, CBC. These are remarkable only for moderate leukocytes, few squamous cells, few bacteria on UA.   This is possibly compatible with acute gout flare. I am not concerned for septic arthritis at this time. He will retreat with nonsteroidal anti-inflammatories. Given return precautions. Patient is to follow up with his primary care physician in 2-3 days for reevaluation.        Toney Sang, MD 11/16/12 [phone]

## 2012-11-15 NOTE — ED Notes (Signed)
MD at bedside. 

## 2012-11-15 NOTE — ED Notes (Signed)
C/o R hand/arm pain and swelling since Saturday.  Reports history of gout.  Also c/o nausea, diarrhea, and abd tightness x 2 days.  Denies vomiting.

## 2012-11-16 NOTE — ED Provider Notes (Signed)
I have personally seen and examined the patient.  I have discussed the plan of care with the resident.  I have reviewed the documentation on PMH/FH/Soc. History.  I have reviewed the documentation of the resident and agree.  Doug Sou, MD 11/16/12 1112

## 2012-11-18 LAB — URINE CULTURE

## 2012-11-19 NOTE — Progress Notes (Signed)
ED Antimicrobial Stewardship Positive Culture Follow Up   Ronnie Hernandez is an 52 y.o. male who presented to [facility] on 11/15/2012 with a chief complaint of hand pain and gout flare. Chief Complaint  Patient presents with  . Hand Pain  . Abdominal Pain  . Diarrhea    Recent Results (from the past 720 hour(s))  URINE CULTURE     Status: None   Collection Time    11/15/12  7:14 PM      Result Value Range Status   Specimen Description URINE, CLEAN CATCH   Final   Special Requests NONE   Final   Culture  Setup Time 11/15/2012 22:08   Final   Colony Count >=100,000 COLONIES/ML   Final   Culture ESCHERICHIA COLI   Final   Report Status 11/18/2012 FINAL   Final   Organism ID, Bacteria ESCHERICHIA COLI   Final     [x]  Patient discharged originally without antimicrobial agent and treatment is now indicated  New antibiotic prescription: Keflex 500mg  PO BID x 5 days  ED Provider: Jaynie Crumble, PA-C   Madolyn Frieze 11/19/2012, 4:55 PM Infectious Diseases Pharmacist Phone# [phone]

## 2013-01-24 ENCOUNTER — Other Ambulatory Visit: Payer: Self-pay | Admitting: Adult Health

## 2013-01-25 ENCOUNTER — Other Ambulatory Visit: Payer: Self-pay | Admitting: Adult Health

## 2013-07-06 ENCOUNTER — Other Ambulatory Visit: Payer: Self-pay | Admitting: Nurse Practitioner

## 2013-07-10 ENCOUNTER — Encounter (HOSPITAL_COMMUNITY): Payer: Self-pay | Admitting: Emergency Medicine

## 2013-07-10 ENCOUNTER — Other Ambulatory Visit: Payer: Self-pay | Admitting: Nurse Practitioner

## 2013-07-10 DIAGNOSIS — I10 Essential (primary) hypertension: Secondary | ICD-10-CM | POA: Insufficient documentation

## 2013-07-10 DIAGNOSIS — Z79899 Other long term (current) drug therapy: Secondary | ICD-10-CM | POA: Insufficient documentation

## 2013-07-10 DIAGNOSIS — E119 Type 2 diabetes mellitus without complications: Secondary | ICD-10-CM | POA: Insufficient documentation

## 2013-07-10 DIAGNOSIS — M109 Gout, unspecified: Secondary | ICD-10-CM | POA: Insufficient documentation

## 2013-07-10 DIAGNOSIS — Z87891 Personal history of nicotine dependence: Secondary | ICD-10-CM | POA: Insufficient documentation

## 2013-07-10 NOTE — ED Notes (Signed)
Pt. reports gout pain with swelling at left hand joints for 2 days unrelieved by prescription Colcrys.

## 2013-07-11 ENCOUNTER — Other Ambulatory Visit: Payer: Self-pay | Admitting: Nurse Practitioner

## 2013-07-11 ENCOUNTER — Emergency Department (HOSPITAL_COMMUNITY): Payer: Medicare HMO

## 2013-07-11 ENCOUNTER — Emergency Department (HOSPITAL_COMMUNITY)
Admission: EM | Admit: 2013-07-11 | Discharge: 2013-07-11 | Disposition: A | Discharge status: Home / Self Care | Payer: Medicare HMO | Attending: Emergency Medicine | Admitting: Emergency Medicine

## 2013-07-11 DIAGNOSIS — M10042 Idiopathic gout, left hand: Secondary | ICD-10-CM

## 2013-07-11 MED ORDER — HYDROCODONE-ACETAMINOPHEN 5-325 MG PO TABS: 1.0000 | ORAL_TABLET | Freq: Four times a day (QID) | ORAL | Status: DC | PRN

## 2013-07-11 MED ORDER — OXYCODONE-ACETAMINOPHEN 5-325 MG PO TABS
2.0000 | ORAL_TABLET | Freq: Once | ORAL | Status: AC
  Administered 2013-07-11: 2 via ORAL
  Filled 2013-07-11: qty 2

## 2013-07-11 MED ORDER — INDOMETHACIN 25 MG PO CAPS: 25.0000 mg | ORAL_CAPSULE | Freq: Three times a day (TID) | ORAL | Status: DC | PRN

## 2013-07-11 MED ORDER — COLCHICINE 0.6 MG PO TABS: 0.6000 mg | ORAL_TABLET | Freq: Two times a day (BID) | ORAL | Status: DC

## 2013-07-11 NOTE — Discharge Instructions (Signed)
Colchicine as prescribed. Also recommend that you take indomethacin as prescribed for inflammation. You may take Norco as prescribed for pain control. Follow up with your primary care doctor.  Gout Gout is an inflammatory arthritis caused by a buildup of uric acid crystals in the joints. Uric acid is a chemical that is normally present in the blood. When the level of uric acid in the blood is too high it can form crystals that deposit in your joints and tissues. This causes joint redness, soreness, and swelling (inflammation). Repeat attacks are common. Over time, uric acid crystals can form into masses (tophi) near a joint, destroying bone and causing disfigurement. Gout is treatable and often preventable. CAUSES  The disease begins with elevated levels of uric acid in the blood. Uric acid is produced by your body when it breaks down a naturally found substance called purines. Certain foods you eat, such as meats and fish, contain high amounts of purines. Causes of an elevated uric acid level include:  Being passed down from parent to child (heredity).  Diseases that cause increased uric acid production (such as obesity, psoriasis, and certain cancers).  Excessive alcohol use.  Diet, especially diets rich in meat and seafood.  Medicines, including certain cancer-fighting medicines (chemotherapy), water pills (diuretics), and aspirin.  Chronic kidney disease. The kidneys are no longer able to remove uric acid well.  Problems with metabolism. Conditions strongly associated with gout include:  Obesity.  High blood pressure.  High cholesterol.  Diabetes. Not everyone with elevated uric acid levels gets gout. It is not understood why some people get gout and others do not. Surgery, joint injury, and eating too much of certain foods are some of the factors that can lead to gout attacks. SYMPTOMS   An attack of gout comes on quickly. It causes intense pain with redness, swelling, and warmth  in a joint.  Fever can occur.  Often, only one joint is involved. Certain joints are more commonly involved:  Base of the big toe.  Knee.  Ankle.  Wrist.  Finger. Without treatment, an attack usually goes away in a few days to weeks. Between attacks, you usually will not have symptoms, which is different from many other forms of arthritis. DIAGNOSIS  Your caregiver will suspect gout based on your symptoms and exam. In some cases, tests may be recommended. The tests may include:  Blood tests.  Urine tests.  X-rays.  Joint fluid exam. This exam requires a needle to remove fluid from the joint (arthrocentesis). Using a microscope, gout is confirmed when uric acid crystals are seen in the joint fluid. TREATMENT  There are two phases to gout treatment: treating the sudden onset (acute) attack and preventing attacks (prophylaxis).  Treatment of an Acute Attack.  Medicines are used. These include anti-inflammatory medicines or steroid medicines.  An injection of steroid medicine into the affected joint is sometimes necessary.  The painful joint is rested. Movement can worsen the arthritis.  You may use warm or cold treatments on painful joints, depending which works best for you.  Treatment to Prevent Attacks.  If you suffer from frequent gout attacks, your caregiver may advise preventive medicine. These medicines are started after the acute attack subsides. These medicines either help your kidneys eliminate uric acid from your body or decrease your uric acid production. You may need to stay on these medicines for a very long time.  The early phase of treatment with preventive medicine can be associated with an increase in acute  gout attacks. For this reason, during the first few months of treatment, your caregiver may also advise you to take medicines usually used for acute gout treatment. Be sure you understand your caregiver's directions. Your caregiver may make several  adjustments to your medicine dose before these medicines are effective.  Discuss dietary treatment with your caregiver or dietitian. Alcohol and drinks high in sugar and fructose and foods such as meat, poultry, and seafood can increase uric acid levels. Your caregiver or dietician can advise you on drinks and foods that should be limited. HOME CARE INSTRUCTIONS   Do not take aspirin to relieve pain. This raises uric acid levels.  Only take over-the-counter or prescription medicines for pain, discomfort, or fever as directed by your caregiver.  Rest the joint as much as possible. When in bed, keep sheets and blankets off painful areas.  Keep the affected joint raised (elevated).  Apply warm or cold treatments to painful joints. Use of warm or cold treatments depends on which works best for you.  Use crutches if the painful joint is in your leg.  Drink enough fluids to keep your urine clear or pale yellow. This helps your body get rid of uric acid. Limit alcohol, sugary drinks, and fructose drinks.  Follow your dietary instructions. Pay careful attention to the amount of protein you eat. Your daily diet should emphasize fruits, vegetables, whole grains, and fat-free or low-fat milk products. Discuss the use of coffee, vitamin C, and cherries with your caregiver or dietician. These may be helpful in lowering uric acid levels.  Maintain a healthy body weight. SEEK MEDICAL CARE IF:   You develop diarrhea, vomiting, or any side effects from medicines.  You do not feel better in 24 hours, or you are getting worse. SEEK IMMEDIATE MEDICAL CARE IF:   Your joint becomes suddenly more tender, and you have chills or a fever. MAKE SURE YOU:   Understand these instructions.  Will watch your condition.  Will get help right away if you are not doing well or get worse. Document Released: 06/04/2000 Document Revised: 10/02/2012 Document Reviewed: 01/19/2012 [facility] Patient Information 2014  [place].

## 2013-07-11 NOTE — ED Provider Notes (Signed)
CSN: [id number]     Arrival date & time 07/10/13  2336 History   First MD Initiated Contact with Patient 07/11/13 0058     Chief Complaint  Patient presents with  . Gout   (Consider location/radiation/quality/duration/timing/severity/associated sxs/prior Treatment) HPI Comments: Patient is a 53 year old male with a history of gout who presents today for left hand pain x3 days. Patient been taking culture seen twice a day for symptoms with mild relief. He states that symptoms associated with a throbbing pain sensation that is nonradiating. Pain is worse with movement of his fingers and palpation. He denies any trauma or injury to the hand as well as any fevers, numbness/tingling, pallor, and weakness. He states that symptoms are consistent with his usual gout flares  The history is provided by the patient. No language interpreter was used.    Past Medical History  Diagnosis Date  . Hypertension   . Hyperlipidemia   . Diabetes mellitus   . Morbidly obese   . Arthritis     right fingers, both knees  . Gout of hand    Past Surgical History  Procedure Laterality Date  . Unremarkable     Family History  Problem Relation Age of Onset  . Stomach cancer Maternal Aunt 80  . Colon cancer Maternal Grandfather 34  . Heart attack Father    History  Substance Use Topics  . Smoking status: Former Smoker    Quit date: 02/09/1982  . Smokeless tobacco: Never Used  . Alcohol Use: No    Review of Systems  Constitutional: Negative for fever.  Musculoskeletal: Positive for arthralgias, joint swelling and myalgias.  Skin: Negative for color change and pallor.  Neurological: Negative for weakness and numbness.  All other systems reviewed and are negative.    Allergies  Review of patient's allergies indicates no known allergies.  Home Medications   Current Outpatient Rx  Name  Route  Sig  Dispense  Refill  . colchicine 0.6 MG tablet   Oral   Take 1 tablet (0.6 mg total) by mouth 2  (two) times daily.   10 tablet   0   . HYDROcodone-acetaminophen (NORCO/VICODIN) 5-325 MG per tablet   Oral   Take 1-2 tablets by mouth every 6 (six) hours as needed.   13 tablet   0   . indomethacin (INDOCIN) 25 MG capsule   Oral   Take 1 capsule (25 mg total) by mouth 3 (three) times daily as needed.   30 capsule   0    BP 175/87  Pulse 99  Temp(Src) 98.7 F (37.1 C) (Oral)  Resp 20  Wt 407 lb 4 oz (184.727 kg)  SpO2 96%  Physical Exam  Nursing note and vitals reviewed. Constitutional: He is oriented to person, place, and time. He appears well-developed and well-nourished. No distress.  HENT:  Head: Normocephalic and atraumatic.  Eyes: Conjunctivae and EOM are normal. No scleral icterus.  Neck: Normal range of motion.  Cardiovascular: Normal rate, regular rhythm and intact distal pulses.   Distal radial pulses 2+ in left hand. Capillary refill normal.  Pulmonary/Chest: Effort normal. No respiratory distress.  Musculoskeletal:       Left wrist: Normal.       Left hand: He exhibits decreased range of motion (Secondary to pain and swelling), tenderness, bony tenderness, deformity (Swan-neck deformity of all 5 fingers of bilateral hands) and swelling (diffuse between MCP and PIP joints). He exhibits normal two-point discrimination, normal capillary refill and no laceration. Normal  sensation noted. Normal strength noted.       Hands: Neurological: He is alert and oriented to person, place, and time.  Sensation intact in all fingers of left hand. Patient able to wiggle fingers.  Skin: Skin is warm and dry. No rash noted. He is not diaphoretic. No pallor.  Psychiatric: He has a normal mood and affect. His behavior is normal.    ED Course  Procedures (including critical care time) Labs Review Labs Reviewed - No data to display Imaging Review Dg Hand Complete Left  07/11/2013   CLINICAL DATA:  Swelling and pain. History of gout. No trauma. Morbid obesity.  EXAM: LEFT HAND  - COMPLETE 3+ VIEW  COMPARISON:  None  FINDINGS: Degraded position on all views, with the fingers mildly flexed. Extensive and age advanced osteoarthritis throughout. Soft tissue swelling which is most apparent dorsally at the levels of the metacarpal phalangeal joints. No soft tissue calcifications. Osseous irregularity about the distal metacarpals in a periarticular distribution.  IMPRESSION: Primarily osteoarthritis and soft tissue swelling centered at the level of the metacarpal phalangeal joints dorsally. Difficult to exclude concurrent gout in the second through fifth metacarpal phalangeal joints, given suggestion of periarticular osseous irregularity.   Electronically Signed   By: Abigail Miyamoto M.D.   On: 07/11/2013 00:38    EKG Interpretation   None       MDM   1. Acute idiopathic gout of left hand    Uncomplicated acute idiopathic gout flare. Patient neurovascularly intact on physical exam. No evidence of septic joint. Patient states that symptoms consistent with prior gout flares. No trauma or injury to the area. Patient well and nontoxic appearing, hemodynamically stable, and afebrile. He is stable for discharge with refill of his colchicine in addition to indomethacin for inflammation and Norco for pain control. Primary care followup advised. Return precautions discussed and patient agreeable to plan with no unaddressed concerns.   Filed Vitals:   07/11/13 0106  BP: 175/87  Pulse: 99  Temp: 98.7 F (37.1 C)  Resp: 20     Antonietta Breach, PA-C 07/11/13 0130

## 2013-07-13 NOTE — ED Provider Notes (Signed)
Medical screening examination/treatment/procedure(s) were performed by non-physician practitioner and as supervising physician I was immediately available for consultation/collaboration.  EKG Interpretation   None         Houston Siren, MD 07/13/13 [id number]

## 2013-08-01 ENCOUNTER — Encounter: Payer: Self-pay | Admitting: Nurse Practitioner

## 2013-08-01 ENCOUNTER — Ambulatory Visit (INDEPENDENT_AMBULATORY_CARE_PROVIDER_SITE_OTHER): Payer: Medicare HMO | Admitting: Nurse Practitioner

## 2013-08-01 VITALS — BP 142/88 | HR 84 | Temp 98.5°F | Resp 10 | Wt >= 6400 oz

## 2013-08-01 DIAGNOSIS — IMO0002 Reserved for concepts with insufficient information to code with codable children: Secondary | ICD-10-CM

## 2013-08-01 DIAGNOSIS — E785 Hyperlipidemia, unspecified: Secondary | ICD-10-CM | POA: Insufficient documentation

## 2013-08-01 DIAGNOSIS — E1165 Type 2 diabetes mellitus with hyperglycemia: Secondary | ICD-10-CM

## 2013-08-01 DIAGNOSIS — IMO0001 Reserved for inherently not codable concepts without codable children: Secondary | ICD-10-CM

## 2013-08-01 DIAGNOSIS — M109 Gout, unspecified: Secondary | ICD-10-CM

## 2013-08-01 DIAGNOSIS — I10 Essential (primary) hypertension: Secondary | ICD-10-CM | POA: Insufficient documentation

## 2013-08-01 DIAGNOSIS — R21 Rash and other nonspecific skin eruption: Secondary | ICD-10-CM

## 2013-08-01 MED ORDER — NYSTATIN-TRIAMCINOLONE 100000-0.1 UNIT/GM-% EX OINT: 1.0000 "application " | TOPICAL_OINTMENT | Freq: Two times a day (BID) | CUTANEOUS | Status: DC

## 2013-08-01 MED ORDER — COLCHICINE 0.6 MG PO TABS: 0.6000 mg | ORAL_TABLET | Freq: Two times a day (BID) | ORAL | Status: DC

## 2013-08-01 MED ORDER — INDOMETHACIN 25 MG PO CAPS: 25.0000 mg | ORAL_CAPSULE | Freq: Three times a day (TID) | ORAL | Status: DC | PRN

## 2013-08-01 MED ORDER — ALLOPURINOL 300 MG PO TABS: 300.0000 mg | ORAL_TABLET | Freq: Two times a day (BID) | ORAL | Status: DC

## 2013-08-01 NOTE — Patient Instructions (Addendum)
Follow up in 1 month for physical -- lab work done today     FOR GOUT Take colcrys 2 tablet when you get your medication and repeat 1 tablet in 1 tablet in 1 hour Cont allopurinol daily   FOR RASH Will give cream to be applied  Use Eucerin lotion twice daily   Bronchitis mucinex DM 1 tablet twice daily for 1 week for cough and congestion-- take with full glass of water PLAIN nasal saline spray into both nares as needed for nasal congestion Call if this does not improve or gets worse   Bronchitis is inflammation of the airways that extend from the windpipe into the lungs (bronchi). The inflammation often causes mucus to develop, which leads to a cough. If the inflammation becomes severe, it may cause shortness of breath. CAUSES  Bronchitis may be caused by:   Viral infections.   Bacteria.   Cigarette smoke.   Allergens, pollutants, and other irritants.  SIGNS AND SYMPTOMS  The most common symptom of bronchitis is a frequent cough that produces mucus. Other symptoms include:  Fever.   Body aches.   Chest congestion.   Chills.   Shortness of breath.   Sore throat.  DIAGNOSIS  Bronchitis is usually diagnosed through a medical history and physical exam. Tests, such as chest X-rays, are sometimes done to rule out other conditions.  TREATMENT  You may need to avoid contact with whatever caused the problem (smoking, for example). Medicines are sometimes needed. These may include:  Antibiotics. These may be prescribed if the condition is caused by bacteria.  Cough suppressants. These may be prescribed for relief of cough symptoms.   Inhaled medicines. These may be prescribed to help open your airways and make it easier for you to breathe.   Steroid medicines. These may be prescribed for those with recurrent (chronic) bronchitis. HOME CARE INSTRUCTIONS  Get plenty of rest.   Drink enough fluids to keep your urine clear or pale yellow (unless you have a  medical condition that requires fluid restriction). Increasing fluids may help thin your secretions and will prevent dehydration.   Only take over-the-counter or prescription medicines as directed by your health care provider.  Only take antibiotics as directed. Make sure you finish them even if you start to feel better.  Avoid secondhand smoke, irritating chemicals, and strong fumes. These will make bronchitis worse. If you are a smoker, quit smoking. Consider using nicotine gum or skin patches to help control withdrawal symptoms. Quitting smoking will help your lungs heal faster.   Put a cool-mist humidifier in your bedroom at night to moisten the air. This may help loosen mucus. Change the water in the humidifier daily. You can also run the hot water in your shower and sit in the bathroom with the door closed for 5 10 minutes.   Follow up with your health care provider as directed.   Wash your hands frequently to avoid catching bronchitis again or spreading an infection to others.  SEEK MEDICAL CARE IF: Your symptoms do not improve after 1 week of treatment.  SEEK IMMEDIATE MEDICAL CARE IF:  Your fever increases.  You have chills.   You have chest pain.   You have worsening shortness of breath.   You have bloody sputum.  You faint.  You have lightheadedness.  You have a severe headache.   You vomit repeatedly. MAKE SURE YOU:   Understand these instructions.  Will watch your condition.  Will get help right away if  you are not doing well or get worse. Document Released: 06/07/2005 Document Revised: 03/28/2013 Document Reviewed: 01/30/2013 [facility] Patient Information 2014 [place].

## 2013-08-01 NOTE — Progress Notes (Signed)
Patient ID: Ronnie Hernandez, male   DOB: 24-Jul-1960, 53 y.o.   MRN: [id number]    No Known Allergies  Chief Complaint  Patient presents with  . Medication Management    Request for gout medication , gout flare up in right hand  . Arthritis    Patient with generalized arthritis in various locations   . URI    x 1 week: patient c/o cough, body aches, and stopped up nose   . Labs Only    Patient fasting if any necessary labs due     HPI: Patient is a 53 y.o. male seen in the office today for gout flare and out of medication Has been getting refills from ED Regarding gout medication-- Took last pills early this week-- worsening GOUT in hands  Has not taken medication for Diabetes and cholesterol for about a year Worsening pains in hands and knees  Cough and congestion for about 1 week- chest has been tight, productive green sputum with cough, has been feeling weak but much better now ; no fevers  Has not taking any medications   Review of Systems:  Review of Systems  Constitutional: Negative for fever, chills and malaise/fatigue.  HENT: Positive for congestion (chest). Negative for sore throat.   Respiratory: Positive for cough and sputum production. Negative for shortness of breath.   Cardiovascular: Negative for chest pain, palpitations and leg swelling.  Gastrointestinal: Negative for heartburn, diarrhea and constipation.  Genitourinary: Negative for dysuria.  Musculoskeletal: Positive for joint pain (hands and knees). Negative for falls.  Skin: Positive for itching and rash.  Neurological: Negative for dizziness, weakness and headaches.     Past Medical History  Diagnosis Date  . Hypertension   . Hyperlipidemia   . Diabetes type 2, uncontrolled   . Morbidly obese   . Arthritis     right fingers, both knees  . Gout of hand    Past Surgical History  Procedure Laterality Date  . Unremarkable     Social History:   reports that he quit smoking about 31 years ago. He  has never used smokeless tobacco. He reports that he does not drink alcohol or use illicit drugs.  Family History  Problem Relation Age of Onset  . Stomach cancer Maternal Aunt 80  . Colon cancer Maternal Grandfather 51  . Heart attack Father     Medications: Patient's Medications  New Prescriptions   No medications on file  Previous Medications   ALLOPURINOL (ZYLOPRIM) 300 MG TABLET    Take 300 mg by mouth 2 (two) times daily.   COLCHICINE 0.6 MG TABLET    Take 1 tablet (0.6 mg total) by mouth 2 (two) times daily.   HYDROCODONE-ACETAMINOPHEN (NORCO/VICODIN) 5-325 MG PER TABLET    Take 1-2 tablets by mouth every 6 (six) hours as needed.   INDOMETHACIN (INDOCIN) 25 MG CAPSULE    Take 1 capsule (25 mg total) by mouth 3 (three) times daily as needed.   METFORMIN (GLUCOPHAGE) 500 MG TABLET    Take 500 mg by mouth 2 (two) times daily with a meal.   SIMVASTATIN (ZOCOR) 20 MG TABLET    Take 20 mg by mouth daily.  Modified Medications   No medications on file  Discontinued Medications   No medications on file     Physical Exam:  Filed Vitals:   08/01/13 1003  BP: 142/88  Pulse: 84  Temp: 98.5 F (36.9 C)  TempSrc: Oral  Resp: 10  Weight: 411 lb (  186.428 kg)  SpO2: 99%    Physical Exam  Constitutional: He is oriented to person, place, and time and well-developed, well-nourished, and in no distress.  HENT:  Head: Normocephalic and atraumatic.  Right Ear: External ear normal.  Left Ear: External ear normal.  Nose: Nose normal.  Mouth/Throat: Oropharynx is clear and moist. No oropharyngeal exudate.  Eyes: Conjunctivae and EOM are normal. Pupils are equal, round, and reactive to light.  Neck: Normal range of motion. Neck supple. No thyromegaly present.  Limited ROM in neck  Cardiovascular: Normal rate, regular rhythm and normal heart sounds.   Pulmonary/Chest: Effort normal and breath sounds normal. No respiratory distress. He has no wheezes.  Abdominal: Soft. Bowel sounds  are normal. He exhibits no distension. There is no tenderness.  obese  Musculoskeletal: He exhibits no edema and no tenderness.  Hands with severe arthritic changes, tophi noted on index finger; pt reports this is chronic  Lymphadenopathy:    He has no cervical adenopathy.  Neurological: He is alert and oriented to person, place, and time.  Skin: Skin is warm and dry.  Psychiatric: Affect normal.     Labs reviewed: Basic Metabolic Panel:  Recent Labs  11/15/12 1849  NA 135  K 4.0  CL 100  CO2 23  GLUCOSE 103*  BUN 15  CREATININE 1.14  CALCIUM 9.1   Liver Function Tests:  Recent Labs  11/15/12 1849  AST 27  ALT 32  ALKPHOS 118*  BILITOT 0.6  PROT 7.2  ALBUMIN 2.7*   No results found for this basename: LIPASE, AMYLASE,  in the last 8760 hours No results found for this basename: AMMONIA,  in the last 8760 hours CBC:  Recent Labs  11/15/12 1849  WBC 8.8  NEUTROABS 5.6  HGB 14.3  HCT 41.9  MCV 90.3  PLT 176     Assessment/Plan 1. Other and unspecified hyperlipidemia -encouraged to cont diet and lifestyle modifications, currently not on medication - Comprehensive metabolic panel - Lipid panel  2. Morbidly obese -per pt has lost 200 lbs; conts to eat well and going to gym daily -involved in water aerobics almost daily -to cont to increase activity and died modifications  3. Hypertension -elevated today, will recheck at next visit, may need to be started on medication (lisinopril due to diabetes) if conts to be elevated   4. Diabetes type 2, uncontrolled -off medications; will get blood work today - CBC With differential/Platelet - Microalbumin, urine - Hemoglobin A1c -encouraged diet compliance -conts in the gym every day 5. Gout of hand - to take 2 tablets of colchicine and in 1 hour repeat 1 tablet - allopurinol (ZYLOPRIM) 300 MG tablet; Take 1 tablet (300 mg total) by mouth 2 (two) times daily.  Dispense: 180 tablet; Refill: 1 - colchicine  0.6 MG tablet; Take 1 tablet (0.6 mg total) by mouth 2 (two) times daily.  Dispense: 60 tablet; Refill: 0 - Uric acid  6. Rash and nonspecific skin eruption - nystatin-triamcinolone ointment (MYCOLOG); Apply 1 application topically 2 (two) times daily.  Dispense: 30 g; for 1 week; keep area clean may apply Eucerin lotion as needed  7. Bronchitis, Viral -overall improved, supportive care for symptom management -mucinex DM BID for 1 week with increase water intake -plain saline to bilateral nares as needed -given return precautions, pt understands  Lab work today to follow up in 1 month for EV

## 2013-08-02 LAB — COMPREHENSIVE METABOLIC PANEL
A/G RATIO: 1.4 (ref 1.1–2.5)
ALBUMIN: 3.8 g/dL (ref 3.5–5.5)
ALK PHOS: 119 IU/L — AB (ref 39–117)
ALT: 17 IU/L (ref 0–44)
AST: 19 IU/L (ref 0–40)
BUN / CREAT RATIO: 10 (ref 9–20)
BUN: 10 mg/dL (ref 6–24)
CALCIUM: 8.6 mg/dL — AB (ref 8.7–10.2)
CO2: 25 mmol/L (ref 18–29)
CREATININE: 1.03 mg/dL (ref 0.76–1.27)
Chloride: 103 mmol/L (ref 97–108)
GFR calc Af Amer: 95 mL/min/{1.73_m2} (ref >59)
GFR, EST NON AFRICAN AMERICAN: 83 mL/min/{1.73_m2} (ref >59)
GLOBULIN, TOTAL: 2.8 g/dL (ref 1.5–4.5)
Glucose: 93 mg/dL (ref 65–99)
Potassium: 4.2 mmol/L (ref 3.5–5.2)
SODIUM: 142 mmol/L (ref 134–144)
Total Bilirubin: 0.2 mg/dL (ref 0.0–1.2)
Total Protein: 6.6 g/dL (ref 6.0–8.5)

## 2013-08-02 LAB — CBC WITH DIFFERENTIAL
BASOS ABS: 0 10*3/uL (ref 0.0–0.2)
Basos: 0 %
Eos: 9 %
Eosinophils Absolute: 0.3 10*3/uL (ref 0.0–0.4)
HEMATOCRIT: 42.7 % (ref 37.5–51.0)
Hemoglobin: 14.6 g/dL (ref 12.6–17.7)
IMMATURE GRANULOCYTES: 0 %
Immature Grans (Abs): 0 10*3/uL (ref 0.0–0.1)
LYMPHS ABS: 1.3 10*3/uL (ref 0.7–3.1)
Lymphs: 44 %
MCH: 30.2 pg (ref 26.6–33.0)
MCHC: 34.2 g/dL (ref 31.5–35.7)
MCV: 88 fL (ref 79–97)
MONOCYTES: 11 %
MONOS ABS: 0.3 10*3/uL (ref 0.1–0.9)
NEUTROS ABS: 1.1 10*3/uL — AB (ref 1.4–7.0)
Neutrophils Relative %: 36 %
PLATELETS: 131 10*3/uL — AB (ref 150–379)
RBC: 4.83 x10E6/uL (ref 4.14–5.80)
RDW: 12.9 % (ref 12.3–15.4)
WBC: 3 10*3/uL — AB (ref 3.4–10.8)

## 2013-08-02 LAB — URIC ACID: URIC ACID: 9.9 mg/dL — AB (ref 3.7–8.6)

## 2013-08-02 LAB — LIPID PANEL
CHOL/HDL RATIO: 3.6 ratio (ref 0.0–5.0)
Cholesterol, Total: 167 mg/dL (ref 100–199)
HDL: 46 mg/dL (ref >39)
LDL CALC: 98 mg/dL (ref 0–99)
TRIGLYCERIDES: 115 mg/dL (ref 0–149)
VLDL Cholesterol Cal: 23 mg/dL (ref 5–40)

## 2013-08-02 LAB — HEMOGLOBIN A1C
Est. average glucose Bld gHb Est-mCnc: 114 mg/dL
HEMOGLOBIN A1C: 5.6 % (ref 4.8–5.6)

## 2013-08-02 LAB — TSH: TSH: 2.94 u[IU]/mL (ref 0.450–4.500)

## 2013-08-02 LAB — MICROALBUMIN, URINE: Microalbumin, Urine: 7 ug/mL (ref 0.0–17.0)

## 2013-08-29 ENCOUNTER — Encounter: Payer: Self-pay | Admitting: Nurse Practitioner

## 2013-08-29 ENCOUNTER — Ambulatory Visit (INDEPENDENT_AMBULATORY_CARE_PROVIDER_SITE_OTHER): Payer: Medicare HMO | Admitting: Nurse Practitioner

## 2013-08-29 VITALS — BP 132/84 | HR 83 | Temp 97.6°F | Resp 10 | Ht 65.0 in | Wt >= 6400 oz

## 2013-08-29 DIAGNOSIS — Z0000 Encounter for general adult medical examination without abnormal findings: Secondary | ICD-10-CM

## 2013-08-29 DIAGNOSIS — IMO0001 Reserved for inherently not codable concepts without codable children: Secondary | ICD-10-CM

## 2013-08-29 DIAGNOSIS — I7789 Other specified disorders of arteries and arterioles: Secondary | ICD-10-CM

## 2013-08-29 DIAGNOSIS — M0520 Rheumatoid vasculitis with rheumatoid arthritis of unspecified site: Secondary | ICD-10-CM

## 2013-08-29 DIAGNOSIS — Z23 Encounter for immunization: Secondary | ICD-10-CM

## 2013-08-29 DIAGNOSIS — E1165 Type 2 diabetes mellitus with hyperglycemia: Secondary | ICD-10-CM

## 2013-08-29 DIAGNOSIS — IMO0002 Reserved for concepts with insufficient information to code with codable children: Secondary | ICD-10-CM

## 2013-08-29 DIAGNOSIS — E785 Hyperlipidemia, unspecified: Secondary | ICD-10-CM

## 2013-08-29 DIAGNOSIS — M109 Gout, unspecified: Secondary | ICD-10-CM

## 2013-08-29 DIAGNOSIS — I10 Essential (primary) hypertension: Secondary | ICD-10-CM

## 2013-08-29 DIAGNOSIS — R21 Rash and other nonspecific skin eruption: Secondary | ICD-10-CM

## 2013-08-29 MED ORDER — INDOMETHACIN 25 MG PO CAPS: 25.0000 mg | ORAL_CAPSULE | Freq: Three times a day (TID) | ORAL | Status: DC | PRN

## 2013-08-29 MED ORDER — TETANUS-DIPHTH-ACELL PERTUSSIS 5-2.5-18.5 LF-MCG/0.5 IM SUSP: 0.5000 mL | Freq: Once | INTRAMUSCULAR | Status: DC

## 2013-08-29 MED ORDER — COLCHICINE 0.6 MG PO TABS: 0.6000 mg | ORAL_TABLET | Freq: Two times a day (BID) | ORAL | Status: DC

## 2013-08-29 MED ORDER — ALLOPURINOL 300 MG PO TABS: 300.0000 mg | ORAL_TABLET | Freq: Two times a day (BID) | ORAL | Status: DC

## 2013-08-29 NOTE — Progress Notes (Signed)
Patient ID: Ronnie Hernandez, male   DOB: February 11, 1961, 53 y.o.   MRN: [id number]    No Known Allergies  Chief Complaint  Patient presents with  . Annual Exam    Yearly exam, discuss labs completed in Feb 2015   . Medication Management    Discuss if patient is to resume Metformin and Simvastatin  . Medication Refill    Renew gout medications     HPI: Patient is a 53 y.o. male seen in the office today for extended visit.  Everything is going goodl getting back in the gym Gout is much better Taking allopurinol and colchine routinely and taking indomethacin at times Would like refills; does not have any complaints today  Review of Systems:  Review of Systems  Constitutional: Negative for fever, chills and malaise/fatigue.  HENT: Negative for congestion and sore throat.   Eyes: Negative for blurred vision.  Respiratory: Positive for cough and sputum production. Negative for shortness of breath.   Cardiovascular: Negative for chest pain, palpitations and leg swelling.  Gastrointestinal: Negative for heartburn, diarrhea and constipation.  Genitourinary: Negative for dysuria.  Musculoskeletal: Positive for joint pain (hands and knees). Negative for falls.  Skin: Positive for itching and rash (new rash to elbows).  Neurological: Negative for dizziness, sensory change, weakness and headaches.  Endo/Heme/Allergies: Negative for environmental allergies.  Psychiatric/Behavioral: Negative for depression. The patient is not nervous/anxious and does not have insomnia.      Past Medical History  Diagnosis Date  . Hypertension   . Hyperlipidemia   . Diabetes type 2, uncontrolled   . Morbidly obese   . Arthritis     right fingers, both knees  . Gout of hand    Past Surgical History  Procedure Laterality Date  . Unremarkable     Social History:   reports that he quit smoking about 31 years ago. He has never used smokeless tobacco. He reports that he does not drink alcohol or use  illicit drugs.  Family History  Problem Relation Age of Onset  . Stomach cancer Maternal Aunt 80  . Colon cancer Maternal Grandfather 59  . Heart attack Father     Medications: Patient's Medications  New Prescriptions   No medications on file  Previous Medications   ALLOPURINOL (ZYLOPRIM) 300 MG TABLET    Take 1 tablet (300 mg total) by mouth 2 (two) times daily.   COLCHICINE 0.6 MG TABLET    Take 1 tablet (0.6 mg total) by mouth 2 (two) times daily.   HYDROCODONE-ACETAMINOPHEN (NORCO/VICODIN) 5-325 MG PER TABLET    Take 1-2 tablets by mouth every 6 (six) hours as needed.   INDOMETHACIN (INDOCIN) 25 MG CAPSULE    Take 1 capsule (25 mg total) by mouth 3 (three) times daily as needed.   NYSTATIN-TRIAMCINOLONE OINTMENT (MYCOLOG)    Apply 1 application topically 2 (two) times daily.  Modified Medications   No medications on file  Discontinued Medications   METFORMIN (GLUCOPHAGE) 500 MG TABLET    Take 500 mg by mouth 2 (two) times daily with a meal.   SIMVASTATIN (ZOCOR) 20 MG TABLET    Take 20 mg by mouth daily.     Physical Exam:  Filed Vitals:   08/29/13 1059  BP: 132/84  Pulse: 83  Temp: 97.6 F (36.4 C)  TempSrc: Oral  Resp: 10  Height: 5\' 5"  (1.651 m)  Weight: 417 lb (189.15 kg)  SpO2: 98%    Physical Exam  Constitutional: He is oriented to  person, place, and time and well-developed, well-nourished, and in no distress.  HENT:  Head: Normocephalic and atraumatic.  Right Ear: External ear normal.  Left Ear: External ear normal.  Nose: Nose normal.  Mouth/Throat: Oropharynx is clear and moist. No oropharyngeal exudate.  Eyes: Conjunctivae and EOM are normal. Pupils are equal, round, and reactive to light.  Neck: Normal range of motion. Neck supple. No thyromegaly present.  Limited ROM in neck  Cardiovascular: Normal rate, regular rhythm, normal heart sounds and intact distal pulses.   Pulmonary/Chest: Effort normal and breath sounds normal. No respiratory distress.  He has no wheezes.  Abdominal: Soft. Bowel sounds are normal. He exhibits no distension. There is no tenderness.  obese  Musculoskeletal: He exhibits no edema and no tenderness.  Hands with severe arthritic changes, tophi noted on index finger; pt reports this is chronic -limited ROM to neck, shoulder joint, and hips  Lymphadenopathy:    He has no cervical adenopathy.  Neurological: He is alert and oriented to person, place, and time.  Skin: Skin is warm and dry.  Psychiatric: Affect normal.     Labs reviewed: Basic Metabolic Panel:  Recent Labs  11/15/12 1849 08/01/13 1132  NA 135 142  K 4.0 4.2  CL 100 103  CO2 23 25  GLUCOSE 103* 93  BUN 15 10  CREATININE 1.14 1.03  CALCIUM 9.1 8.6*  TSH  --  2.940   Liver Function Tests:  Recent Labs  11/15/12 1849 08/01/13 1132  AST 27 19  ALT 32 17  ALKPHOS 118* 119*  BILITOT 0.6 0.2  PROT 7.2 6.6  ALBUMIN 2.7*  --    No results found for this basename: LIPASE, AMYLASE,  in the last 8760 hours No results found for this basename: AMMONIA,  in the last 8760 hours CBC:  Recent Labs  11/15/12 1849 08/01/13 1132  WBC 8.8 3.0*  NEUTROABS 5.6 1.1*  HGB 14.3 14.6  HCT 41.9 42.7  MCV 90.3 88  PLT 176 131*   Lipid Panel:  Recent Labs  08/01/13 1132  HDL 46  LDLCALC 98  TRIG 115  CHOLHDL 3.6   TSH:  Recent Labs  08/01/13 1132  TSH 2.940   A1C: Lab Results  Component Value Date   HGBA1C 5.6 08/01/2013    Assessment/Plan 1. Hypertension -boarderline controlled at this time; not on any medication at this time, will cont to monitor  - EKG 12-Lead  2. Diabetes type 2, uncontrolled -A1c of 5.6, pt been off metformin for over 6 months; will keep pt off metformin and cont to monitor -to cont lifestyle modifications   3. Other and unspecified hyperlipidemia -amazingly pts LDL and total cholesterol are well controlled off medication -will cont to keep pt off medication and follow up lipids  -to cont diet  and exercise modifications  4. Morbidly obese -pt with some weight gain because he has been out of the gym; encouraged to keep routine and keep going to gym; pt reports he normally goes daily, also encouraged dietary compliance  5. Gout of hand -improved now back on medications; will provide refills - allopurinol (ZYLOPRIM) 300 MG tablet; Take 1 tablet (300 mg total) by mouth 2 (two) times daily.  Dispense: 180 tablet; Refill: 1 - indomethacin (INDOCIN) 25 MG capsule; Take 1 capsule (25 mg total) by mouth 3 (three) times daily as needed.  Dispense: 90 capsule; Refill: 3 - colchicine 0.6 MG tablet; Take 1 tablet (0.6 mg total) by mouth 2 (two) times  daily.  Dispense: 60 tablet; Refill: 3  6. Routine general medical examination at a health care facility - PREVENTIVE COUNSELING:  The patient was counseled regarding the appropriate use of alcohol, prevention of dental and periodontal disease, diet, regular sustained exercise for at least 30 minutes 3-4 times per week, testicular self-examination on a monthly basis,smoking cessation, the proper use of sunscreen and protective clothing, tobacco use,  and recommended schedule for GI hemoccult testing, colonoscopy, cholesterol, thyroid and diabetes screening. -encouraged ophthalmology appt and to follow up with a dentist   -colonoscopy screening last in 2013  7. Rash and nonspecific skin eruption -question xanthoma however cholesterol is WNL - Ambulatory referral to Dermatology for further evaluation  8. Rheumatoid arteritis  -per records pt with RA, however RF was negative;  pt reports he has not been seen by rheumatology but has rheumatoid features in hands and wrist, not on any medications for RA; will get  referral to Rheumatology for evaluation and treatment  3 month follow up with Dr Bubba Camp, and PRN

## 2013-08-29 NOTE — Patient Instructions (Addendum)
FOLLOW UP in 3 months with Ronnie Hernandez Cont heart healthy diet and working out daily to GOAL is weight loss Will refer you to dermatology and rheumatology  Gout Gout is an inflammatory arthritis caused by a buildup of uric acid crystals in the joints. Uric acid is a chemical that is normally present in the blood. When the level of uric acid in the blood is too high it can form crystals that deposit in your joints and tissues. This causes joint redness, soreness, and swelling (inflammation). Repeat attacks are common. Over time, uric acid crystals can form into masses (tophi) near a joint, destroying bone and causing disfigurement. Gout is treatable and often preventable. CAUSES  The disease begins with elevated levels of uric acid in the blood. Uric acid is produced by your body when it breaks down a naturally found substance called purines. Certain foods you eat, such as meats and fish, contain high amounts of purines. Causes of an elevated uric acid level include:  Being passed down from parent to child (heredity).  Diseases that cause increased uric acid production (such as obesity, psoriasis, and certain cancers).  Excessive alcohol use.  Diet, especially diets rich in meat and seafood.  Medicines, including certain cancer-fighting medicines (chemotherapy), water pills (diuretics), and aspirin.  Chronic kidney disease. The kidneys are no longer able to remove uric acid well.  Problems with metabolism. Conditions strongly associated with gout include:  Obesity.  High blood pressure.  High cholesterol.  Diabetes. Not everyone with elevated uric acid levels gets gout. It is not understood why some people get gout and others do not. Surgery, joint injury, and eating too much of certain foods are some of the factors that can lead to gout attacks. SYMPTOMS   An attack of gout comes on quickly. It causes intense pain with redness, swelling, and warmth in a joint.  Fever can  occur.  Often, only one joint is involved. Certain joints are more commonly involved:  Base of the big toe.  Knee.  Ankle.  Wrist.  Finger. Without treatment, an attack usually goes away in a few days to weeks. Between attacks, you usually will not have symptoms, which is different from many other forms of arthritis. DIAGNOSIS  Your caregiver will suspect gout based on your symptoms and exam. In some cases, tests may be recommended. The tests may include:  Blood tests.  Urine tests.  X-rays.  Joint fluid exam. This exam requires a needle to remove fluid from the joint (arthrocentesis). Using a microscope, gout is confirmed when uric acid crystals are seen in the joint fluid. TREATMENT  There are two phases to gout treatment: treating the sudden onset (acute) attack and preventing attacks (prophylaxis).  Treatment of an Acute Attack.  Medicines are used. These include anti-inflammatory medicines or steroid medicines.  An injection of steroid medicine into the affected joint is sometimes necessary.  The painful joint is rested. Movement can worsen the arthritis.  You may use warm or cold treatments on painful joints, depending which works best for you.  Treatment to Prevent Attacks.  If you suffer from frequent gout attacks, your caregiver may advise preventive medicine. These medicines are started after the acute attack subsides. These medicines either help your kidneys eliminate uric acid from your body or decrease your uric acid production. You may need to stay on these medicines for a very long time.  The early phase of treatment with preventive medicine can be associated with an increase in acute gout attacks.  For this reason, during the first few months of treatment, your caregiver may also advise you to take medicines usually used for acute gout treatment. Be sure you understand your caregiver's directions. Your caregiver may make several adjustments to your medicine  dose before these medicines are effective.  Discuss dietary treatment with your caregiver or dietitian. Alcohol and drinks high in sugar and fructose and foods such as meat, poultry, and seafood can increase uric acid levels. Your caregiver or dietician can advise you on drinks and foods that should be limited. HOME CARE INSTRUCTIONS   Do not take aspirin to relieve pain. This raises uric acid levels.  Only take over-the-counter or prescription medicines for pain, discomfort, or fever as directed by your caregiver.  Rest the joint as much as possible. When in bed, keep sheets and blankets off painful areas.  Keep the affected joint raised (elevated).  Apply warm or cold treatments to painful joints. Use of warm or cold treatments depends on which works best for you.  Use crutches if the painful joint is in your leg.  Drink enough fluids to keep your urine clear or pale yellow. This helps your body get rid of uric acid. Limit alcohol, sugary drinks, and fructose drinks.  Follow your dietary instructions. Pay careful attention to the amount of protein you eat. Your daily diet should emphasize fruits, vegetables, whole grains, and fat-free or low-fat milk products. Discuss the use of coffee, vitamin C, and cherries with your caregiver or dietician. These may be helpful in lowering uric acid levels.  Maintain a healthy body weight. SEEK MEDICAL CARE IF:   You develop diarrhea, vomiting, or any side effects from medicines.  You do not feel better in 24 hours, or you are getting worse. SEEK IMMEDIATE MEDICAL CARE IF:   Your joint becomes suddenly more tender, and you have chills or a fever. MAKE SURE YOU:   Understand these instructions.  Will watch your condition.  Will get help right away if you are not doing well or get worse. Document Released: 06/04/2000 Document Revised: 10/02/2012 Document Reviewed: 01/19/2012 [facility] Patient Information 2014 [place].

## 2013-09-17 ENCOUNTER — Other Ambulatory Visit: Payer: Self-pay | Admitting: Nurse Practitioner

## 2013-09-20 ENCOUNTER — Other Ambulatory Visit: Payer: Self-pay

## 2013-10-20 ENCOUNTER — Other Ambulatory Visit (HOSPITAL_BASED_OUTPATIENT_CLINIC_OR_DEPARTMENT_OTHER): Payer: Self-pay | Admitting: Internal Medicine

## 2013-11-11 ENCOUNTER — Other Ambulatory Visit: Payer: Self-pay | Admitting: Nurse Practitioner

## 2013-11-21 ENCOUNTER — Ambulatory Visit: Payer: Self-pay | Admitting: Internal Medicine

## 2013-11-27 ENCOUNTER — Ambulatory Visit: Payer: Medicare HMO | Admitting: Internal Medicine

## 2013-12-25 ENCOUNTER — Encounter: Payer: Self-pay | Admitting: Internal Medicine

## 2013-12-25 ENCOUNTER — Ambulatory Visit (INDEPENDENT_AMBULATORY_CARE_PROVIDER_SITE_OTHER): Payer: Medicare HMO | Admitting: Internal Medicine

## 2013-12-25 VITALS — BP 146/76 | HR 111 | Temp 98.0°F | Resp 22 | Ht 65.0 in | Wt >= 6400 oz

## 2013-12-25 DIAGNOSIS — E119 Type 2 diabetes mellitus without complications: Secondary | ICD-10-CM | POA: Insufficient documentation

## 2013-12-25 DIAGNOSIS — T50904A Poisoning by unspecified drugs, medicaments and biological substances, undetermined, initial encounter: Secondary | ICD-10-CM

## 2013-12-25 DIAGNOSIS — I10 Essential (primary) hypertension: Secondary | ICD-10-CM

## 2013-12-25 DIAGNOSIS — M1020 Drug-induced gout, unspecified site: Secondary | ICD-10-CM | POA: Insufficient documentation

## 2013-12-25 DIAGNOSIS — M109 Gout, unspecified: Secondary | ICD-10-CM

## 2013-12-25 DIAGNOSIS — M10249 Drug-induced gout, unspecified hand: Secondary | ICD-10-CM

## 2013-12-25 DIAGNOSIS — E785 Hyperlipidemia, unspecified: Secondary | ICD-10-CM

## 2013-12-25 MED ORDER — ASPIRIN 81 MG PO TABS: 81.0000 mg | ORAL_TABLET | Freq: Every day | ORAL | Status: DC

## 2013-12-25 MED ORDER — LISINOPRIL 5 MG PO TABS: 5.0000 mg | ORAL_TABLET | Freq: Every day | ORAL | Status: DC

## 2013-12-25 MED ORDER — ALLOPURINOL 300 MG PO TABS: 300.0000 mg | ORAL_TABLET | Freq: Two times a day (BID) | ORAL | Status: DC

## 2013-12-25 MED ORDER — PNEUMOCOCCAL 13-VAL CONJ VACC IM SUSP: 0.5000 mL | Freq: Once | INTRAMUSCULAR | Status: DC

## 2013-12-25 MED ORDER — COLCHICINE 0.6 MG PO TABS: 0.6000 mg | ORAL_TABLET | Freq: Two times a day (BID) | ORAL | Status: DC

## 2013-12-25 NOTE — Progress Notes (Signed)
Patient ID: Ronnie Hernandez, male   DOB: 02/22/1961, 53 y.o.   MRN: [id number]    Chief Complaint  Patient presents with  . Follow-up   No Known Allergies  HPI 53 y/o pt seeing our NP is here for follow up. He has hx of DM, obesity, HTN, gout among others. He has not seen ye doctor in several years and wants eye doctor referral Is using kenalog ointment to his skin and follows with dermatology Also seeing his rheumatology for gout and ? RA No recent gout attack Complaint with his medications Has not been exercising. Tries to eat healthy  Review of Systems  Constitutional: Negative for fever, chills and malaise/fatigue.  HENT: Negative for congestion and sore throat.   Eyes: Negative for blurred vision.  Respiratory: Negative for shortness of breath.  Cardiovascular: Negative for chest pain, palpitations and leg swelling.  Gastrointestinal: Negative for heartburn, diarrhea and constipation.  Genitourinary: Negative for dysuria.  Musculoskeletal: has joint pain, better controlled. Negative for falls.  Neurological: Negative for dizziness, sensory change, weakness and headaches.  Endo/Heme/Allergies: Negative for environmental allergies.  Psychiatric/Behavioral: Negative for depression. The patient is not nervous/anxious and does not have insomnia.    Past Medical History  Diagnosis Date  . Hypertension   . Hyperlipidemia   . Diabetes type 2, uncontrolled   . Morbidly obese   . Arthritis     right fingers, both knees  . Gout of hand    Medication reviewed. See [facility]  Physical exam BP 146/76  Pulse 111  Temp(Src) 98 F (36.7 C) (Oral)  Resp 22  Ht 5\' 5"  (1.651 m)  Wt 444 lb 12.8 oz (201.76 kg)  BMI 74.02 kg/m2  SpO2 97%  Constitutional: He is oriented to person, place, and time and obese and in no distress.  HENT:   Head: Normocephalic and atraumatic.  Right Ear: External ear normal.  Left Ear: External ear normal.   Nose: Nose normal.   Mouth/Throat: Oropharynx  is clear and moist. No oropharyngeal exudate.  Eyes: Conjunctivae and EOM are normal. Pupils are equal, round, and reactive to light.  Neck: Normal range of motion. Neck supple. No thyromegaly present.  Cardiovascular: Normal rate, regular rhythm, normal heart sounds and intact distal pulses.   Pulmonary/Chest: Effort normal and breath sounds normal. No respiratory distress. He has no wheezes.  Abdominal: Soft. Bowel sounds are normal. He exhibits no distension. There is no tenderness.  Musculoskeletal: He exhibits no edema and no tenderness. Has arthritis change sin his digits in his hands, tophi in index finger Lymphadenopathy:    He has no cervical adenopathy.  Neurological: He is alert and oriented to person, place, and time.  Skin: Skin is warm and dry.  Psychiatric: Affect normal.   labs Lipid Panel     Component Value Date/Time   TRIG 115 08/01/2013 1132   HDL 46 08/01/2013 1132   CHOLHDL 3.6 08/01/2013 1132   LDLCALC 98 08/01/2013 1132   Lab Results  Component Value Date   HGBA1C 5.6 08/01/2013   Lab Results  Component Value Date   CREATININE 1.03 08/01/2013   Lab Results  Component Value Date   URICACID 9.9* 08/01/2013    Assessment/Plan  Essential hypertension With bp being elevated and given his obesity will start him on lisinopril 5 mg daily for now and reassess. Continue aspirin - Basic Metabolic Panel  Type 2 diabetes mellitus without complication Check P3A. Continue aspirin. Add lisinopril. Cholesterol well controlled. Weight loss encouraged -  Ambulatory referral to Ophthalmology - Hemoglobin L2X - Basic Metabolic Panel  Morbidly obese Encouraged exercise and weight loss. Pt mentions he will enroll in the gym tomorrow. Calorie counting and poritoned meals recommended with exercise for 30 minutes 5 days a week to start with - Uric Acid  Other and unspecified hyperlipidemia Off all medications. Monitor clinically  Gout of hand, unspecified cause,  unspecified chronicity, unspecified laterality Check uric acid level. Continue allopurinol and colchicine. Refill provided - allopurinol (ZYLOPRIM) 300 MG tablet; Take 1 tablet (300 mg total) by mouth 2 (two) times daily.  Dispense: 180 tablet; Refill: 1

## 2013-12-26 LAB — BASIC METABOLIC PANEL
BUN/Creatinine Ratio: 13 (ref 9–20)
BUN: 14 mg/dL (ref 6–24)
CHLORIDE: 105 mmol/L (ref 97–108)
CO2: 22 mmol/L (ref 18–29)
Calcium: 9.1 mg/dL (ref 8.7–10.2)
Creatinine, Ser: 1.08 mg/dL (ref 0.76–1.27)
GFR calc Af Amer: 90 mL/min/{1.73_m2} (ref >59)
GFR calc non Af Amer: 78 mL/min/{1.73_m2} (ref >59)
GLUCOSE: 99 mg/dL (ref 65–99)
Potassium: 4.1 mmol/L (ref 3.5–5.2)
Sodium: 142 mmol/L (ref 134–144)

## 2013-12-26 LAB — HEMOGLOBIN A1C
Est. average glucose Bld gHb Est-mCnc: 120 mg/dL
Hgb A1c MFr Bld: 5.8 % — ABNORMAL HIGH (ref 4.8–5.6)

## 2013-12-26 LAB — URIC ACID: Uric Acid: 5.7 mg/dL (ref 3.7–8.6)

## 2013-12-28 ENCOUNTER — Emergency Department (INDEPENDENT_AMBULATORY_CARE_PROVIDER_SITE_OTHER)
Admission: EM | Admit: 2013-12-28 | Discharge: 2013-12-28 | Disposition: A | Discharge status: Home / Self Care | Payer: Managed Care, Other (non HMO) | Source: Home / Self Care

## 2013-12-28 ENCOUNTER — Encounter: Payer: Self-pay | Admitting: *Deleted

## 2013-12-28 ENCOUNTER — Telehealth: Payer: Self-pay | Admitting: *Deleted

## 2013-12-28 ENCOUNTER — Encounter (HOSPITAL_COMMUNITY): Payer: Self-pay | Admitting: Emergency Medicine

## 2013-12-28 DIAGNOSIS — R310 Gross hematuria: Secondary | ICD-10-CM

## 2013-12-28 DIAGNOSIS — E1169 Type 2 diabetes mellitus with other specified complication: Secondary | ICD-10-CM

## 2013-12-28 DIAGNOSIS — N390 Urinary tract infection, site not specified: Secondary | ICD-10-CM

## 2013-12-28 LAB — POCT URINALYSIS DIP (DEVICE)
BILIRUBIN URINE: NEGATIVE
GLUCOSE, UA: NEGATIVE mg/dL
KETONES UR: NEGATIVE mg/dL
NITRITE: NEGATIVE
PH: 6 (ref 5.0–8.0)
Protein, ur: NEGATIVE mg/dL
Specific Gravity, Urine: 1.015 (ref 1.005–1.030)
Urobilinogen, UA: 0.2 mg/dL (ref 0.0–1.0)

## 2013-12-28 MED ORDER — CEPHALEXIN 500 MG PO CAPS: 500.0000 mg | ORAL_CAPSULE | Freq: Four times a day (QID) | ORAL | Status: DC

## 2013-12-28 NOTE — ED Provider Notes (Signed)
Medical screening examination/treatment/procedure(s) were performed by resident physician or non-physician practitioner and as supervising physician I was immediately available for consultation/collaboration.   Pauline Good MD.   Billy Fischer, MD 12/28/13 1131

## 2013-12-28 NOTE — ED Provider Notes (Signed)
CSN: [id number]     Arrival date & time 12/28/13  1033 History   First MD Initiated Contact with Patient 12/28/13 1058     Chief Complaint  Patient presents with  . Hematuria   (Consider location/radiation/quality/duration/timing/severity/associated sxs/prior Treatment) HPI Comments: Severely and morbidly obese male with T2DM, HTN, dyslipidemia is c/o hematuria noticed yesterday. Noticed just a little burning with it. No frequency. No bleeding except with urination. No flank or inguinal pain.   Past Medical History  Diagnosis Date  . Hypertension   . Hyperlipidemia   . Diabetes type 2, uncontrolled   . Morbidly obese   . Arthritis     right fingers, both knees  . Gout of hand    Past Surgical History  Procedure Laterality Date  . Unremarkable     Family History  Problem Relation Age of Onset  . Stomach cancer Maternal Aunt 80  . Colon cancer Maternal Grandfather 4  . Heart attack Father    History  Substance Use Topics  . Smoking status: Former Smoker    Quit date: 02/09/1982  . Smokeless tobacco: Never Used  . Alcohol Use: No    Review of Systems  Constitutional: Negative for fever and activity change.  Respiratory: Negative.   Gastrointestinal: Negative.   Genitourinary: Positive for urgency and hematuria. Negative for frequency, flank pain, decreased urine volume, discharge, penile swelling, scrotal swelling, genital sores, penile pain and testicular pain.  Musculoskeletal: Positive for back pain.       Located over the sacrum, worse with movement, bending.  Skin: Negative.   Neurological: Negative.     Allergies  Review of patient's allergies indicates no known allergies.  Home Medications   Prior to Admission medications   Medication Sig Start Date End Date Taking? Authorizing Provider  allopurinol (ZYLOPRIM) 300 MG tablet Take 1 tablet (300 mg total) by mouth 2 (two) times daily. 12/25/13   Blanchie Serve, MD  aspirin 81 MG tablet Take 1 tablet (81 mg  total) by mouth daily. 12/25/13   Blanchie Serve, MD  cephALEXin (KEFLEX) 500 MG capsule Take 1 capsule (500 mg total) by mouth 4 (four) times daily. 12/28/13   Janne Napoleon, NP  colchicine 0.6 MG tablet Take 1 tablet (0.6 mg total) by mouth 2 (two) times daily. 12/25/13   Blanchie Serve, MD  HYDROcodone-acetaminophen (NORCO/VICODIN) 5-325 MG per tablet Take 1-2 tablets by mouth every 6 (six) hours as needed. 07/11/13   Antonietta Breach, PA-C  lisinopril (PRINIVIL,ZESTRIL) 5 MG tablet Take 1 tablet (5 mg total) by mouth daily. 12/25/13   Blanchie Serve, MD  nystatin-triamcinolone ointment (MYCOLOG) APPLY 1 APPLICATION TOPICALLY 2 (TWO) TIMES DAILY.    Pricilla Larsson, NP  Tdap Durwin Reges) 5-2.5-18.5 LF-MCG/0.5 injection Inject 0.5 mLs into the muscle once. 08/29/13   Pricilla Larsson, NP  triamcinolone ointment (KENALOG) 0.1 % Apply topically every other day. 12/24/13   Historical Provider, MD   BP 161/109  Pulse 83  Temp(Src) 97.7 F (36.5 C) (Oral)  Resp 20  SpO2 96% Physical Exam  Nursing note and vitals reviewed. Constitutional: He is oriented to person, place, and time. He appears well-developed and well-nourished. No distress.  Eyes: Conjunctivae are normal.  Neck: Normal range of motion. Neck supple.  Cardiovascular: Normal rate and regular rhythm.   Pulmonary/Chest: Effort normal and breath sounds normal. No respiratory distress. He has no rales.  Musculoskeletal: He exhibits edema.  Neurological: He is alert and oriented to person, place, and time. He exhibits normal  muscle tone.  Skin: Skin is warm and dry. No rash noted.  Psychiatric: He has a normal mood and affect.    ED Course  Procedures (including critical care time) Labs Review Labs Reviewed  POCT URINALYSIS DIP (DEVICE) - Abnormal; Notable for the following:    Hgb urine dipstick TRACE (*)    Leukocytes, UA TRACE (*)    All other components within normal limits  URINE CULTURE    Imaging Review No results found.   MDM   1.  Gross hematuria   2. UTI (lower urinary tract infection)   3. Type 2 diabetes mellitus with other specified complication     AZO standard Keflex U/C pending Plenty of water   Janne Napoleon, NP 12/28/13 1124

## 2013-12-28 NOTE — Telephone Encounter (Signed)
Patient called and stated that he was having blood coming from his Penis. Patient thinks it is coming from new medication he has started or from not drinking enough water. Advised patient to go to [facility] due to not having any available appointments here. Patient agreed.

## 2013-12-28 NOTE — Discharge Instructions (Signed)
Urinary Tract Infection  Urinary tract infections (UTIs) can develop anywhere along your urinary tract. Your urinary tract is your body's drainage system for removing wastes and extra water. Your urinary tract includes two kidneys, two ureters, a bladder, and a urethra. Your kidneys are a pair of bean-shaped organs. Each kidney is about the size of your fist. They are located below your ribs, one on each side of your spine.  CAUSES  Infections are caused by microbes, which are microscopic organisms, including fungi, viruses, and bacteria. These organisms are so small that they can only be seen through a microscope. Bacteria are the microbes that most commonly cause UTIs.  SYMPTOMS   Symptoms of UTIs may vary by age and gender of the patient and by the location of the infection. Symptoms in young women typically include a frequent and intense urge to urinate and a painful, burning feeling in the bladder or urethra during urination. Older women and men are more likely to be tired, shaky, and weak and have muscle aches and abdominal pain. A fever may mean the infection is in your kidneys. Other symptoms of a kidney infection include pain in your back or sides below the ribs, nausea, and vomiting.  DIAGNOSIS  To diagnose a UTI, your caregiver will ask you about your symptoms. Your caregiver also will ask to provide a urine sample. The urine sample will be tested for bacteria and white blood cells. White blood cells are made by your body to help fight infection.  TREATMENT   Typically, UTIs can be treated with medication. Because most UTIs are caused by a bacterial infection, they usually can be treated with the use of antibiotics. The choice of antibiotic and length of treatment depend on your symptoms and the type of bacteria causing your infection.  HOME CARE INSTRUCTIONS  · If you were prescribed antibiotics, take them exactly as your caregiver instructs you. Finish the medication even if you feel better after you  have only taken some of the medication.  · Drink enough water and fluids to keep your urine clear or pale yellow.  · Avoid caffeine, tea, and carbonated beverages. They tend to irritate your bladder.  · Empty your bladder often. Avoid holding urine for long periods of time.  · Empty your bladder before and after sexual intercourse.  · After a bowel movement, women should cleanse from front to back. Use each tissue only once.  SEEK MEDICAL CARE IF:   · You have back pain.  · You develop a fever.  · Your symptoms do not begin to resolve within 3 days.  SEEK IMMEDIATE MEDICAL CARE IF:   · You have severe back pain or lower abdominal pain.  · You develop chills.  · You have nausea or vomiting.  · You have continued burning or discomfort with urination.  MAKE SURE YOU:   · Understand these instructions.  · Will watch your condition.  · Will get help right away if you are not doing well or get worse.  Document Released: 03/17/2005 Document Revised: 12/07/2011 Document Reviewed: 07/16/2011  ExitCare® Patient Information ©2015 [employer]. This information is not intended to replace advice given to you by your health care provider. Make sure you discuss any questions you have with your health care provider.  Hematuria, Adult  Hematuria is blood in your urine. It can be caused by a bladder infection, kidney infection, prostate infection, kidney stone, or cancer of your urinary tract. Infections can usually be treated with   medicine, and a kidney stone usually will pass through your urine. If neither of these is the cause of your hematuria, further workup to find out the reason may be needed.  It is very important that you tell your health care provider about any blood you see in your urine, even if the blood stops without treatment or happens without causing pain. Blood in your urine that happens and then stops and then happens again can be a symptom of a very serious condition. Also, pain is not a symptom in the initial  stages of many urinary cancers.  HOME CARE INSTRUCTIONS   · Drink lots of fluid, 3-4 quarts a day. If you have been diagnosed with an infection, cranberry juice is especially recommended, in addition to large amounts of water.  · Avoid caffeine, tea, and carbonated beverages, because they tend to irritate the bladder.  · Avoid alcohol because it may irritate the prostate.  · Only take over-the-counter or prescription medicines for pain, discomfort, or fever as directed by your health care provider.  · If you have been diagnosed with a kidney stone, follow your health care provider's instructions regarding straining your urine to catch the stone.  · Empty your bladder often. Avoid holding urine for long periods of time.  · After a bowel movement, women should cleanse front to back. Use each tissue only once.  · Empty your bladder before and after sexual intercourse if you are a male.  SEEK MEDICAL CARE IF:  You develop back pain, fever, a feeling of sickness in your stomach (nausea), or vomiting or if your symptoms are not better in 3 days. Return sooner if you are getting worse.  SEEK IMMEDIATE MEDICAL CARE IF:   · You have a persistent fever, with a temperature of 101.8°F (38.8°C) or greater.  · You develop severe vomiting and are unable to keep the medicine down.  · You develop severe back or abdominal pain despite taking your medicines.  · You begin passing a large amount of blood or clots in your urine.  · You feel extremely weak or faint, or you pass out.  MAKE SURE YOU:   · Understand these instructions.  · Will watch your condition.  · Will get help right away if you are not doing well or get worse.  Document Released: 06/07/2005 Document Revised: 03/28/2013 Document Reviewed: 02/05/2013  ExitCare® Patient Information ©2015 [employer]. This information is not intended to replace advice given to you by your health care provider. Make sure you discuss any questions you have with your health care  provider.

## 2013-12-28 NOTE — ED Notes (Signed)
Reports noticing blood in urine yesterday, burning with urination, and low back pain.  Has increased water intake and pain not as bad, but remains present.  Patient called senior care and they could not work him in today and sent patient to ucc for evaluation

## 2013-12-31 LAB — URINE CULTURE

## 2013-12-31 NOTE — ED Notes (Signed)
Urine culture: >100,000 colonies E. Coli.  Pt. adequately treated with Keflex. Roselyn Meier 12/31/2013

## 2014-01-02 LAB — HM DIABETES EYE EXAM

## 2014-03-28 ENCOUNTER — Encounter: Payer: Self-pay | Admitting: Nurse Practitioner

## 2014-03-28 ENCOUNTER — Ambulatory Visit (INDEPENDENT_AMBULATORY_CARE_PROVIDER_SITE_OTHER): Payer: Medicare HMO | Admitting: Nurse Practitioner

## 2014-03-28 DIAGNOSIS — Z23 Encounter for immunization: Secondary | ICD-10-CM

## 2014-03-28 DIAGNOSIS — E119 Type 2 diabetes mellitus without complications: Secondary | ICD-10-CM

## 2014-03-28 DIAGNOSIS — M109 Gout, unspecified: Secondary | ICD-10-CM

## 2014-03-28 MED ORDER — LORCASERIN HCL 10 MG PO TABS: ORAL_TABLET | ORAL | Status: DC

## 2014-03-28 MED ORDER — LORCASERIN HCL 10 MG PO TABS: 10.0000 mg | ORAL_TABLET | Freq: Two times a day (BID) | ORAL | Status: DC

## 2014-03-28 NOTE — Patient Instructions (Signed)
Follow up in 6 weeks, given medication for weight loss and will consult nutritionist

## 2014-03-28 NOTE — Progress Notes (Signed)
Patient ID: Ronnie Hernandez, male   DOB: [birth year], 53 y.o.   MRN: [id number]    PCP: [name], NP  No Known Allergies  Chief Complaint  Patient presents with  . Medical Management of Chronic Issues    3 month follow-up, no recent labs   . Immunizations    Flu Vaccine     HPI:  53 y/o pt with a pmh of  obesity, HTN, gout who is here today for routine follow up. Since last visit had went to ED due to hematuria and was found to have UTI. Possible due to not drinking a lot of water? Has been increasing his water intake. Which has helped. Increase urinary frequency.  Went to [employer], now up 10 lbs from July, has not into a routine. Attempted to stay on diet but has not been able to be as strict as he needs to.  Going to the gym to go to classes   Review of Systems:  Review of Systems  Constitutional: Negative for activity change, appetite change, fatigue and unexpected weight change.  HENT: Negative for congestion and hearing loss.   Eyes: Negative.   Respiratory: Negative for cough and shortness of breath.   Cardiovascular: Negative for chest pain, palpitations and leg swelling.  Gastrointestinal: Negative for abdominal pain, diarrhea and constipation.  Genitourinary: Negative for dysuria, frequency, discharge and difficulty urinating.  Musculoskeletal: Positive for arthralgias, gait problem, myalgias and neck stiffness.  Skin: Negative for color change and wound.  Neurological: Negative for dizziness and weakness.  Psychiatric/Behavioral: Negative for behavioral problems, confusion and agitation.    Past Medical History  Diagnosis Date  . Hypertension   . Hyperlipidemia   . Diabetes type 2, uncontrolled   . Morbidly obese   . Arthritis     right fingers, both knees  . Gout of hand    Past Surgical History  Procedure Laterality Date  . Unremarkable     Social History:   reports that he quit smoking about 32 years ago. He has never used smokeless tobacco.  He reports that he does not drink alcohol or use illicit drugs.  Family History  Problem Relation Age of Onset  . Stomach cancer Maternal Aunt 80  . Colon cancer Maternal Grandfather 73  . Heart attack Father     Medications: Patient's Medications  New Prescriptions   No medications on file  Previous Medications   ALLOPURINOL (ZYLOPRIM) 300 MG TABLET    Take 1 tablet (300 mg total) by mouth 2 (two) times daily.   ASPIRIN 81 MG TABLET    Take 1 tablet (81 mg total) by mouth daily.   COLCHICINE 0.6 MG TABLET    Take 1 tablet (0.6 mg total) by mouth 2 (two) times daily.   HYDROCODONE-ACETAMINOPHEN (NORCO/VICODIN) 5-325 MG PER TABLET    Take 1-2 tablets by mouth every 6 (six) hours as needed.   LISINOPRIL (PRINIVIL,ZESTRIL) 5 MG TABLET    Take 1 tablet (5 mg total) by mouth daily.   NYSTATIN-TRIAMCINOLONE OINTMENT (MYCOLOG)    APPLY 1 APPLICATION TOPICALLY 2 (TWO) TIMES DAILY.   TDAP (BOOSTRIX) 5-2.5-18.5 LF-MCG/0.5 INJECTION    Inject 0.5 mLs into the muscle once.   TRIAMCINOLONE OINTMENT (KENALOG) 0.1 %    Apply topically every other day.  Modified Medications   No medications on file  Discontinued Medications   CEPHALEXIN (KEFLEX) 500 MG CAPSULE    Take 1 capsule (500 mg total) by mouth 4 (four) times daily.  Physical Exam: Filed Vitals:   03/28/14 1416  BP: 144/80  Pulse: 99  Temp: 97.1 F (36.2 C)  TempSrc: Oral  Resp: 12  Height: 5\' 5"  (1.651 m)  Weight: 454 lb (205.933 kg)  SpO2: 97%    Physical Exam  Constitutional: He is oriented to person, place, and time. He appears well-developed and well-nourished. No distress.  Obese   HENT:  Head: Normocephalic and atraumatic.  Mouth/Throat: Oropharynx is clear and moist. No oropharyngeal exudate.  Eyes: Conjunctivae and EOM are normal. Pupils are equal, round, and reactive to light.  Neck: Normal range of motion. Neck supple.  Cardiovascular: Normal rate, regular rhythm and normal heart sounds.   Pulmonary/Chest:  Effort normal and breath sounds normal.  Abdominal: Soft. Bowel sounds are normal.  Musculoskeletal: He exhibits no edema and no tenderness.  Has arthritis changes to his digits in his hands, tophi in index finger   Neurological: He is alert and oriented to person, place, and time.  Skin: Skin is warm and dry. He is not diaphoretic.  Psychiatric: He has a normal mood and affect.    Labs reviewed: Basic Metabolic Panel:  Recent Labs  08/01/13 1132 12/25/13 1558  NA 142 142  K 4.2 4.1  CL 103 105  CO2 25 22  GLUCOSE 93 99  BUN 10 14  CREATININE 1.03 1.08  CALCIUM 8.6* 9.1   Liver Function Tests:  Recent Labs  08/01/13 1132  AST 19  ALT 17  ALKPHOS 119*  BILITOT 0.2  PROT 6.6   No results found for this basename: LIPASE, AMYLASE,  in the last 8760 hours No results found for this basename: AMMONIA,  in the last 8760 hours CBC:  Recent Labs  08/01/13 1132  WBC 3.0*  NEUTROABS 1.1*  HGB 14.6  HCT 42.7  MCV 88  PLT 131*   TSH:  Recent Labs  08/01/13 1132  TSH 2.940   A1C: Lab Results  Component Value Date   HGBA1C 5.8* 12/25/2013   Lipid Panel:  Recent Labs  08/01/13 1132  HDL 46  LDLCALC 98  TRIG 115  CHOLHDL 3.6      Assessment/Plan 1. Morbid obesity -is motivated to lose weight and get back in the gym. Will have pt meet with nutritionist to go over meal planning and smart food choices Will start belviq to curb appetite  - Lorcaserin HCl (BELVIQ) 10 MG TABS; Take 10 mg by mouth 2 (two) times daily.  Dispense: 30 tablet; Refill: 0 - Ambulatory referral to Nutrition and Diabetic Education - Lorcaserin HCl (BELVIQ) 10 MG TABS; 1 tablet by mouth twice daily  Dispense: 60 tablet; Refill: 0  2. Diabetes type 2 -last A1c of 5.8, which is up from 5.6  -pt not on medications but diet controlled  -conts on lisinopril   3. Gout controlled on allopurinol   Will follow up in 6 weeks regarding belviq and weight loss

## 2014-04-17 ENCOUNTER — Other Ambulatory Visit: Payer: Self-pay | Admitting: Internal Medicine

## 2014-04-26 ENCOUNTER — Ambulatory Visit: Payer: Medicare HMO | Admitting: Dietician

## 2014-05-09 ENCOUNTER — Ambulatory Visit: Payer: Medicare HMO | Admitting: Nurse Practitioner

## 2014-06-03 ENCOUNTER — Encounter: Payer: Self-pay | Admitting: *Deleted

## 2014-06-03 ENCOUNTER — Encounter: Payer: Medicare HMO | Attending: Nurse Practitioner | Admitting: *Deleted

## 2014-06-03 VITALS — Ht 68.0 in | Wt 393.0 lb

## 2014-06-03 DIAGNOSIS — Z713 Dietary counseling and surveillance: Secondary | ICD-10-CM | POA: Diagnosis not present

## 2014-06-03 DIAGNOSIS — Z6841 Body Mass Index (BMI) 40.0 and over, adult: Secondary | ICD-10-CM | POA: Insufficient documentation

## 2014-06-03 DIAGNOSIS — E119 Type 2 diabetes mellitus without complications: Secondary | ICD-10-CM | POA: Insufficient documentation

## 2014-06-03 NOTE — Progress Notes (Signed)
  Medical Nutrition Therapy:  Appt start time: 6834 end time:  1630.  Assessment:  Primary concerns today: patient here for weight loss and pre-diabetes.He states history of more severe obesity in wheel chair and over 500 pounds.  He has attended [employer] 3 days a week.for an hour doing exercises in gym and water exercises. Patient on disability, he lives with his sister, he washes his own clothes and he does his own food shopping. They share the food preparation.   Preferred Learning Style:  Auditory  Visual  Hands on  No preference indicated   Learning Readiness:   Ready  Change in progress  MEDICATIONS: see list   DIETARY INTAKE:  24-hr recall:  B ( AM): usually skips OR oatmeal with fresh fruit occasionally  Snk ( AM): fast food - chicken nuggets (2 pkgs of 10) and chocolate brownie, water L ( PM): skips if he has a snack Snk ( PM):  D ( PM): meat, starch, occasionally vegetables, sweet green tea or clear regular soda Snk ( PM): buttered popcorn or fresh fruit Beverages: water, sweet green tea, clear regular soda  Usual physical activity:  Gym 3 days a week.for an hour doing exercises in gym and water exercises   Estimated energy needs: 2400 calories 270 g carbohydrates 180 g protein 67 g fat  Progress Towards Goal(s):  In progress.   Nutritional Diagnosis:  NI-1.5 Excessive energy intake As related to activity level.  As evidenced by BMI of 59.9.    Intervention:  Nutrition counseling and diabetes education initiated. Discussed Carb Counting by food group as method of portion control, reading food labels, and benefits of increased activity. Encouraged him to continue at the gym and explained relationship of calorie intake and energy expenditure..  Plan:  Aim for 4 Carb Choices per meal (60 grams) +/- 1 either way  Aim for 0-2 Carbs per snack if hungry  Include protein in moderation with your meals and snacks Consider drinking 4 large glasses of water each  day Consider reading food labels for Total Carbohydrate of foods Consider  increasing your activity level by going to gym and pool for 45-60 minutes as tolerated  Teaching Method Utilized:  Visual Auditory Hands on  Handouts given during visit include: Carb Counting and Food Label handouts Meal Plan Card  Barriers to learning/adherence to lifestyle change: morbid obesity  Demonstrated degree of understanding via:  Teach Back   Monitoring/Evaluation:  Dietary intake, exercise, reading food labels, and body weight in 1 month(s).

## 2014-06-03 NOTE — Patient Instructions (Signed)
Plan:  Aim for 4 Carb Choices per meal (60 grams) +/- 1 either way  Aim for 0-2 Carbs per snack if hungry  Include protein in moderation with your meals and snacks Consider drinking 4 large glasses of water each day Consider reading food labels for Total Carbohydrate of foods Consider  increasing your activity level by going to gym and pool for 45-60 minutes as tolerated

## 2014-06-06 ENCOUNTER — Ambulatory Visit: Payer: Medicare HMO | Admitting: Nurse Practitioner

## 2014-06-13 ENCOUNTER — Ambulatory Visit: Payer: Medicare HMO | Admitting: Nurse Practitioner

## 2014-07-04 ENCOUNTER — Ambulatory Visit: Payer: Medicare HMO | Admitting: Nurse Practitioner

## 2014-07-11 ENCOUNTER — Ambulatory Visit: Payer: Medicare HMO | Admitting: *Deleted

## 2014-07-25 ENCOUNTER — Encounter: Payer: Medicare HMO | Attending: Nurse Practitioner | Admitting: *Deleted

## 2014-07-25 ENCOUNTER — Encounter: Payer: Self-pay | Admitting: *Deleted

## 2014-07-25 DIAGNOSIS — Z713 Dietary counseling and surveillance: Secondary | ICD-10-CM | POA: Diagnosis not present

## 2014-07-25 DIAGNOSIS — Z6841 Body Mass Index (BMI) 40.0 and over, adult: Secondary | ICD-10-CM | POA: Diagnosis not present

## 2014-07-25 DIAGNOSIS — E119 Type 2 diabetes mellitus without complications: Secondary | ICD-10-CM | POA: Diagnosis present

## 2014-07-25 DIAGNOSIS — IMO0002 Reserved for concepts with insufficient information to code with codable children: Secondary | ICD-10-CM

## 2014-07-25 DIAGNOSIS — E1165 Type 2 diabetes mellitus with hyperglycemia: Secondary | ICD-10-CM

## 2014-07-25 NOTE — Progress Notes (Signed)
  Medical Nutrition Therapy:  Appt start time: 1100 end time:  1130.  Assessment:  Primary concerns today: patient here for weight loss and pre-diabetes follow up visit. He has not been to the gym lately due to having a sister visit from out of town and cold weather. Weight gain noted, not dependable due to large variation on scale with 2 tries. He states he is eating more fresh fruit in an effort to improve his healthy eating habits.  Preferred Learning Style: Auditory  Learning Readiness:   Ready  Change in progress  MEDICATIONS: see list   DIETARY INTAKE:  24-hr recall:  B ( AM): usually skips OR oatmeal with fresh fruit occasionally Or large bowl Raisin Bran cereal and bananas Or left over supper.  Today: 3 eggs with cheese, pancakes, T-bone steak, toast and large glass OJ Snk ( AM): fast food - chicken nuggets (2 pkgs of 10) and chocolate brownie, water L ( PM): skips if he has a snack Snk ( PM):  D ( PM): meat, starch, occasionally vegetables, sweet green tea or clear regular soda Snk ( PM): buttered popcorn or fresh fruit Beverages: water, sweet green tea, clear regular soda  Usual physical activity:  Was going to the gym 3 days a week.for an hour doing exercises in gym and water exercises but has stopped since the holidays.   Estimated energy needs: 2400 calories 270 g carbohydrates 180 g protein 67 g fat  Progress Towards Goal(s):  In progress.   Nutritional Diagnosis:  NI-1.5 Excessive energy intake As related to activity level.  As evidenced by BMI of 59.9.    Intervention:  Nutrition counseling and diabetes education continued. Reviewed Carb Counting by food group as method of portion control, reading food labels, and benefits of increased activity. Encouraged him to continue at the gym and explained relationship of calorie intake and energy expenditure..  Plan:  Aim for 4 Carb Choices per meal (60 grams) +/- 1 either way  Aim for 0-2 Carbs per snack if hungry   Include protein in moderation with your meals and snacks Consider drinking 4 large glasses of water each day Consider reading food labels for Total Carbohydrate of foods Consider increasing your activity level by doing Arm Chair Exercises on the days you don't go to the pool   Teaching Method Utilized:  Auditory  Handouts given during visit include: Arm Chair Exercise handout  Barriers to learning/adherence to lifestyle change: morbid obesity  Demonstrated degree of understanding via:  Teach Back   Monitoring/Evaluation:  Dietary intake, exercise, reading food labels, and body weight in 1 month(s).

## 2014-07-25 NOTE — Patient Instructions (Addendum)
Plan:  Consider  increasing your activity level by doing Arm Chair Exercises on the days you don't go to the pool

## 2014-08-01 ENCOUNTER — Encounter: Payer: Self-pay | Admitting: Nurse Practitioner

## 2014-08-01 ENCOUNTER — Ambulatory Visit (INDEPENDENT_AMBULATORY_CARE_PROVIDER_SITE_OTHER): Payer: Medicare HMO | Admitting: Nurse Practitioner

## 2014-08-01 VITALS — BP 136/78 | HR 92 | Temp 96.4°F | Resp 12 | Ht 68.0 in | Wt >= 6400 oz

## 2014-08-01 DIAGNOSIS — R350 Frequency of micturition: Secondary | ICD-10-CM

## 2014-08-01 DIAGNOSIS — I10 Essential (primary) hypertension: Secondary | ICD-10-CM

## 2014-08-01 DIAGNOSIS — R0602 Shortness of breath: Secondary | ICD-10-CM

## 2014-08-01 DIAGNOSIS — E119 Type 2 diabetes mellitus without complications: Secondary | ICD-10-CM

## 2014-08-01 MED ORDER — TETANUS-DIPHTH-ACELL PERTUSSIS 5-2.5-18.5 LF-MCG/0.5 IM SUSP: 0.5000 mL | Freq: Once | INTRAMUSCULAR | Status: DC

## 2014-08-01 MED ORDER — LISINOPRIL 10 MG PO TABS: 10.0000 mg | ORAL_TABLET | Freq: Every day | ORAL | Status: DC

## 2014-08-01 NOTE — Patient Instructions (Addendum)
GO TO THE GYM-- just get there!  Cont to follow up with Dietitian  Will check blood work today   Follow up in 4 weeks!

## 2014-08-01 NOTE — Progress Notes (Signed)
Patient ID: Ronnie Hernandez, male   DOB: Mar 13, 1961, 54 y.o.   MRN: [id number]    PCP: [name], NP  No Known Allergies  Chief Complaint  Patient presents with  . Medical Management of Chronic Issues    6 week follow-up, no recent labs   . Form Completion    Request for handicap sticker, needs letter stating he is getting personal help with cooking, cleaning, and house duties from sister    HPI:  Ronnie Hernandez is a 54 year old man who presents today for follow up.  Last visit was in October and he was given Belviq for weight loss.  He has not taken this because his insurance will not cover it.   Today he complains of shortness of breath. Has not made dietary modifications, still not going to the gym. Has cont to gain weight  1. Shortness of breath - He noticed this about one month ago.  He is most short of breath with activity.  He is concerned because his sister, who he lives with, had a respiratory illness recently.    2. Urinary frequency- Patient reports he has to urinate 5-6 times per night.  He noticed symptoms months ago. He reports that he small amount of bleeding from his penis.  He says it is not much blood at all.  The last time this happened was in December.    3. Obesity - He is meeting with a nutritionist and reports that he has learned about the proper foods to eat and he has his sister cook for him at home mostly.  Avoids fast food.    Review of Systems:  Review of Systems  Constitutional: Negative for activity change, appetite change, fatigue and unexpected weight change.  Respiratory: Negative for cough and shortness of breath.   Cardiovascular: Negative for chest pain, palpitations and leg swelling.  Gastrointestinal: Negative for abdominal pain, diarrhea and constipation.  Genitourinary: Positive for frequency. Negative for dysuria, hematuria, discharge (reports intermittent blood from tip of penis-- none currently) and difficulty urinating.    Musculoskeletal: Positive for back pain. Negative for myalgias and joint swelling.  Skin: Negative for color change and wound.  Neurological: Negative for dizziness and weakness.  Psychiatric/Behavioral: Negative for behavioral problems, confusion and agitation.    Past Medical History  Diagnosis Date  . Hypertension   . Hyperlipidemia   . Diabetes type 2, uncontrolled   . Morbidly obese   . Arthritis     right fingers, both knees  . Gout of hand    Past Surgical History  Procedure Laterality Date  . Unremarkable     Social History:   reports that he quit smoking about 32 years ago. He has never used smokeless tobacco. He reports that he does not drink alcohol or use illicit drugs.  Family History  Problem Relation Age of Onset  . Stomach cancer Maternal Aunt 80  . Colon cancer Maternal Grandfather 30  . Heart attack Father     Medications: Patient's Medications  New Prescriptions   No medications on file  Previous Medications   ALLOPURINOL (ZYLOPRIM) 300 MG TABLET    Take 1 tablet (300 mg total) by mouth 2 (two) times daily.   ASPIRIN 81 MG EC TABLET    TAKE 1 TABLET (81 MG TOTAL) BY MOUTH DAILY.   COLCHICINE 0.6 MG TABLET    Take 1 tablet (0.6 mg total) by mouth 2 (two) times daily.   HYDROCODONE-ACETAMINOPHEN (NORCO/VICODIN) 5-325 MG PER  TABLET    Take 1-2 tablets by mouth every 6 (six) hours as needed.   LISINOPRIL (PRINIVIL,ZESTRIL) 5 MG TABLET    Take 1 tablet (5 mg total) by mouth daily.   METFORMIN (GLUCOPHAGE) 500 MG TABLET    Take 500 mg by mouth 2 (two) times daily with a meal.   NYSTATIN-TRIAMCINOLONE OINTMENT (MYCOLOG)    APPLY 1 APPLICATION TOPICALLY 2 (TWO) TIMES DAILY.   TRIAMCINOLONE OINTMENT (KENALOG) 0.1 %    Apply topically every other day.  Modified Medications   Modified Medication Previous Medication   TDAP (BOOSTRIX) 5-2.5-18.5 LF-MCG/0.5 INJECTION Tdap (BOOSTRIX) 5-2.5-18.5 LF-MCG/0.5 injection      Inject 0.5 mLs into the muscle once.     Inject 0.5 mLs into the muscle once.  Discontinued Medications   LORCASERIN HCL (BELVIQ) 10 MG TABS    Take 10 mg by mouth 2 (two) times daily.   LORCASERIN HCL (BELVIQ) 10 MG TABS    1 tablet by mouth twice daily     Physical Exam: Filed Vitals:   08/01/14 1403  BP: 136/78  Pulse: 92  Temp: 96.4 F (35.8 C)  TempSrc: Oral  Resp: 12  Height: 5\' 8"  (1.727 m)  Weight: 465 lb (210.923 kg)  SpO2: 99%    Physical Exam  Constitutional: He is oriented to person, place, and time. He appears well-developed and well-nourished. No distress.  Obese   HENT:  Head: Normocephalic and atraumatic.  Eyes: EOM are normal.  Neck: Normal range of motion. Neck supple.  Cardiovascular: Normal rate, regular rhythm and normal heart sounds.   Pulmonary/Chest: Effort normal and breath sounds normal. He has no wheezes. He has no rales.  Genitourinary:  Unable to perform rectal exam  Musculoskeletal: Normal range of motion. He exhibits no edema or tenderness.  Has arthritis changes to his digits in his hands, tophi in index finger   Neurological: He is alert and oriented to person, place, and time.  Skin: Skin is warm and dry. He is not diaphoretic.  Psychiatric: He has a normal mood and affect.    Labs reviewed: Basic Metabolic Panel:  Recent Labs  12/25/13 1558  NA 142  K 4.1  CL 105  CO2 22  GLUCOSE 99  BUN 14  CREATININE 1.08  CALCIUM 9.1   Liver Function Tests: No results for input(s): AST, ALT, ALKPHOS, BILITOT, PROT, ALBUMIN in the last 8760 hours. No results for input(s): LIPASE, AMYLASE in the last 8760 hours. No results for input(s): AMMONIA in the last 8760 hours. CBC: No results for input(s): WBC, NEUTROABS, HGB, HCT, MCV, PLT in the last 8760 hours. TSH: No results for input(s): TSH in the last 8760 hours. A1C: Lab Results  Component Value Date   HGBA1C 5.8* 12/25/2013   Lipid Panel: No results for input(s): CHOL, HDL, LDLCALC, TRIG, CHOLHDL, LDLDIRECT in the  last 8760 hours.    Assessment/Plan 1. Morbid obesity -is motivated to lose weight and goes to a gym whenever he can, but has not been lately because of cold weather.  Meets with nutritionist regularly. Encouraged him to continue with that and to reduce the amount of sugar that he gets in his drinks.  Counseled patient to return to the gym.  He agreed to start going to water aerobics class.    2. Diabetes type 2 -last A1c of 5.8, which is up from 5.6  -pt not on medications but diet controlled however has not been following diet -conts on lisinopril Will obtain A1c,  urine microalbumin, CMP   3. Urinary frequency  Reduce fluids before bedtime. Will get UA  C&S to rule out infection and get PSA today.    4. Shortness of breath  Worse with activity, most like due to weight gain and debility. No signs of infectious etiology. Patients lungs are clear.  Denies cough.    5. Hypertension  Blood pressure not at goal.  However pt reports he has already increased lisinopril to 10 mg daily.  Encouraged lifestyle modifications and will recheck at 4 week appt -follow up BMP  Patient requests letter from office to prove that his sister is helping him around the house with cooking, cleaning and other things that he cannot do due to his weight.    Follow up in 4 weeks.

## 2014-08-02 LAB — URINALYSIS
Bilirubin, UA: NEGATIVE
Glucose, UA: NEGATIVE
Ketones, UA: NEGATIVE
LEUKOCYTES UA: NEGATIVE
NITRITE UA: NEGATIVE
PH UA: 6 (ref 5.0–7.5)
Protein, UA: NEGATIVE
RBC UA: NEGATIVE
SPEC GRAV UA: 1.017 (ref 1.005–1.030)
UUROB: 0.2 mg/dL (ref 0.2–1.0)

## 2014-08-02 LAB — BASIC METABOLIC PANEL
BUN / CREAT RATIO: 14 (ref 9–20)
BUN: 15 mg/dL (ref 6–24)
CHLORIDE: 103 mmol/L (ref 97–108)
CO2: 24 mmol/L (ref 18–29)
Calcium: 9.3 mg/dL (ref 8.7–10.2)
Creatinine, Ser: 1.05 mg/dL (ref 0.76–1.27)
GFR calc non Af Amer: 80 mL/min/{1.73_m2} (ref >59)
GFR, EST AFRICAN AMERICAN: 93 mL/min/{1.73_m2} (ref >59)
GLUCOSE: 100 mg/dL — AB (ref 65–99)
Potassium: 4.2 mmol/L (ref 3.5–5.2)
Sodium: 141 mmol/L (ref 134–144)

## 2014-08-02 LAB — HEMOGLOBIN A1C
Est. average glucose Bld gHb Est-mCnc: 123 mg/dL
HEMOGLOBIN A1C: 5.9 % — AB (ref 4.8–5.6)

## 2014-08-02 LAB — MICROALBUMIN, URINE: Microalbumin, Urine: <3 ug/mL (ref 0.0–17.0)

## 2014-08-02 LAB — PSA: PSA: 0.2 ng/mL (ref 0.0–4.0)

## 2014-08-03 LAB — URINE CULTURE

## 2014-08-20 ENCOUNTER — Other Ambulatory Visit: Payer: Self-pay | Admitting: Internal Medicine

## 2014-08-29 ENCOUNTER — Encounter: Payer: Self-pay | Admitting: Nurse Practitioner

## 2014-08-29 ENCOUNTER — Ambulatory Visit: Payer: Medicare HMO | Admitting: *Deleted

## 2014-08-29 ENCOUNTER — Ambulatory Visit (INDEPENDENT_AMBULATORY_CARE_PROVIDER_SITE_OTHER): Payer: Medicare HMO | Admitting: Nurse Practitioner

## 2014-08-29 VITALS — BP 138/80 | HR 82 | Temp 98.0°F | Resp 20 | Ht 68.0 in | Wt >= 6400 oz

## 2014-08-29 DIAGNOSIS — M109 Gout, unspecified: Secondary | ICD-10-CM | POA: Diagnosis not present

## 2014-08-29 DIAGNOSIS — I10 Essential (primary) hypertension: Secondary | ICD-10-CM

## 2014-08-29 DIAGNOSIS — E119 Type 2 diabetes mellitus without complications: Secondary | ICD-10-CM

## 2014-08-29 DIAGNOSIS — E785 Hyperlipidemia, unspecified: Secondary | ICD-10-CM | POA: Diagnosis not present

## 2014-08-29 NOTE — Patient Instructions (Addendum)
Follow up in 2 months with fasting blood work prior to visit    To start heart healthy diet, avoid sweets and to control the amount of carbohydrates you eat (starchy foods such as flour, bread, and potatoes), avoid high fat dairy (like ice cream, whole milk, cheeses), can buy a heart healthy cook book from the american heart association to help with meal planning.   Food high-Sodium Foods: Smoked, cured, salted or canned meat, fish or poultry including bacon, cold cuts, ham, frankfurters, sausage, sardines, caviar and anchovies. Frozen breaded meats and dinners, such as burritos and pizza. Canned entrees, such as ravioli, spam and chili. Salted nuts or chips Beans canned with salt added.   Cardiac Diet This diet can help prevent heart disease and stroke. Many factors influence your heart health, including eating and exercise habits. Coronary risk rises a lot with abnormal blood fat (lipid) levels. Cardiac meal planning includes limiting unhealthy fats, increasing healthy fats, and making other small dietary changes. General guidelines are as follows:  Adjust calorie intake to reach and maintain desirable body weight.  Limit total fat intake to less than 30% of total calories. Saturated fat should be less than 7% of calories.  Saturated fats are found in animal products and in some vegetable products. Saturated vegetable fats are found in coconut oil, cocoa butter, palm oil, and palm kernel oil. Read labels carefully to avoid these products as much as possible. Use butter in moderation. Choose tub margarines and oils that have 2 grams of fat or less. Good cooking oils are canola and olive oils.  Practice low-fat cooking techniques. Do not fry food. Instead, broil, bake, boil, steam, grill, roast on a rack, stir-fry, or microwave it. Other fat reducing suggestions include:  Remove the skin from poultry.  Remove all visible fat from meats.  Skim the fat off stews, soups, and gravies before  serving them.  Steam vegetables in water or broth instead of sauting them in fat.  Avoid foods with trans fat (or hydrogenated oils), such as commercially fried foods and commercially baked goods. Commercial shortening and deep-frying fats will contain trans fat.  Increase intake of fruits, vegetables, whole grains, and legumes to replace foods high in fat.  Increase consumption of nuts, legumes, and seeds to at least 4 servings weekly. One serving of a legume equals  cup, and 1 serving of nuts or seeds equals  cup.  Choose whole grains more often. Have 3 servings per day (a serving is 1 ounce [oz]).  Eat 4 to 5 servings of vegetables per day. A serving of vegetables is 1 cup of raw leafy vegetables;  cup of raw or cooked cut-up vegetables;  cup of vegetable juice.  Eat 4 to 5 servings of fruit per day. A serving of fruit is 1 medium whole fruit;  cup of dried fruit;  cup of fresh, frozen, or canned fruit;  cup of 100% fruit juice.  Increase your intake of dietary fiber to 20 to 30 grams per day. Insoluble fiber may help lower your risk of heart disease and may help curb your appetite.  Soluble fiber binds cholesterol to be removed from the blood. Foods high in soluble fiber are dried beans, citrus fruits, oats, apples, bananas, broccoli, Brussels sprouts, and eggplant.  Try to include foods fortified with plant sterols or stanols, such as yogurt, breads, juices, or margarines. Choose several fortified foods to achieve a daily intake of 2 to 3 grams of plant sterols or stanols.  Foods  with omega-3 fats can help reduce your risk of heart disease. Aim to have a 3.5 oz portion of fatty fish twice per week, such as salmon, mackerel, albacore tuna, sardines, lake trout, or herring. If you wish to take a fish oil supplement, choose one that contains 1 gram of both DHA and EPA.  Limit processed meats to 2 servings (3 oz portion) weekly.  Limit the sodium in your diet to 1500 milligrams (mg)  per day. If you have high blood pressure, talk to a registered dietitian about a DASH (Dietary Approaches to Stop Hypertension) eating plan.  Limit sweets and beverages with added sugar, such as soda, to no more than 5 servings per week. One serving is:   1 tablespoon sugar.  1 tablespoon jelly or jam.   cup sorbet.  1 cup lemonade.   cup regular soda. CHOOSING FOODS Starches  Allowed: Breads: All kinds (wheat, rye, raisin, white, oatmeal, [place], [place], and English muffin bread). Low-fat rolls: English muffins, frankfurter and hamburger buns, bagels, pita bread, tortillas (not fried). Pancakes, waffles, biscuits, and muffins made with recommended oil.  Avoid: Products made with saturated or trans fats, oils, or whole milk products. Butter rolls, cheese breads, croissants. Commercial doughnuts, muffins, sweet rolls, biscuits, waffles, pancakes, store-bought mixes. Crackers  Allowed: Low-fat crackers and snacks: Animal, graham, rye, saltine (with recommended oil, no lard), oyster, and matzo crackers. Bread sticks, melba toast, rusks, flatbread, pretzels, and light popcorn.  Avoid: High-fat crackers: cheese crackers, butter crackers, and those made with coconut, palm oil, or trans fat (hydrogenated oils). Buttered popcorn. Cereals  Allowed: Hot or cold whole-grain cereals.  Avoid: Cereals containing coconut, hydrogenated vegetable fat, or animal fat. Potatoes / Pasta / Rice  Allowed: All kinds of potatoes, rice, and pasta (such as macaroni, spaghetti, and noodles).  Avoid: Pasta or rice prepared with cream sauce or high-fat cheese. Chow mein noodles, [place] fries. Vegetables  Allowed: All vegetables and vegetable juices.  Avoid: Fried vegetables. Vegetables in cream, butter, or high-fat cheese sauces. Limit coconut. Fruit in cream or custard. Protein  Allowed: Limit your intake of meat, seafood, and poultry to no more than 6 oz (cooked weight) per day. All lean,  well-trimmed beef, veal, pork, and lamb. All chicken and [place] without skin. All fish and shellfish. Wild game: wild duck, rabbit, pheasant, and venison. Egg whites or low-cholesterol egg substitutes may be used as desired. Meatless dishes: recipes with dried beans, peas, lentils, and tofu (soybean curd). Seeds and nuts: all seeds and most nuts.  Avoid: Prime grade and other heavily marbled and fatty meats, such as short ribs, spare ribs, rib eye roast or steak, frankfurters, sausage, bacon, and high-fat luncheon meats, mutton. Caviar. Commercially fried fish. Domestic duck, goose, venison sausage. Organ meats: liver, gizzard, heart, chitterlings, brains, kidney, sweetbreads. Dairy  Allowed: Low-fat cheeses: nonfat or low-fat cottage cheese (1% or 2% fat), cheeses made with part skim milk, such as mozzarella, farmers, string, or ricotta. (Cheeses should be labeled no more than 2 to 6 grams fat per oz.). Skim (or 1%) milk: liquid, powdered, or evaporated. Buttermilk made with low-fat milk. Drinks made with skim or low-fat milk or cocoa. Chocolate milk or cocoa made with skim or low-fat (1%) milk. Nonfat or low-fat yogurt.  Avoid: Whole milk cheeses, including col[place], cheddar, [place], Monte[place] Jack, [place], [place], [place], American, Swiss, and blue. Creamed cottage cheese, cream cheese. Whole milk and whole milk products, including buttermilk or yogurt made from whole milk, drinks made from whole milk.  Condensed milk, evaporated whole milk, and 2% milk. Soups and Combination Foods  Allowed: Low-fat low-sodium soups: broth, dehydrated soups, homemade broth, soups with the fat removed, homemade cream soups made with skim or low-fat milk. Low-fat spaghetti, lasagna, chili, and Spanish rice if low-fat ingredients and low-fat cooking techniques are used.  Avoid: Cream soups made with whole milk, cream, or high-fat cheese. All other soups. Desserts and Sweets  Allowed: Sherbet, fruit ices, gelatins,  meringues, and angel food cake. Homemade desserts with recommended fats, oils, and milk products. Jam, jelly, honey, marmalade, sugars, and syrups. Pure sugar candy, such as gum drops, hard candy, jelly beans, marshmallows, mints, and small amounts of dark chocolate.  Avoid: Commercially prepared cakes, pies, cookies, frosting, pudding, or mixes for these products. Desserts containing whole milk products, chocolate, coconut, lard, palm oil, or palm kernel oil. Ice cream or ice cream drinks. Candy that contains chocolate, coconut, butter, hydrogenated fat, or unknown ingredients. Buttered syrups. Fats and Oils  Allowed: Vegetable oils: safflower, sunflower, corn, soybean, cottonseed, sesame, canola, olive, or peanut. Non-hydrogenated margarines. Salad dressing or mayonnaise: homemade or commercial, made with a recommended oil. Low or nonfat salad dressing or mayonnaise.  Limit added fats and oils to 6 to 8 tsp per day (includes fats used in cooking, baking, salads, and spreads on bread). Remember to count the "hidden fats" in foods.  Avoid: Solid fats and shortenings: butter, lard, salt pork, bacon drippings. Gravy containing meat fat, shortening, or suet. Cocoa butter, coconut. Coconut oil, palm oil, palm kernel oil, or hydrogenated oils: these ingredients are often used in bakery products, nondairy creamers, whipped toppings, candy, and commercially fried foods. Read labels carefully. Salad dressings made of unknown oils, sour cream, or cheese, such as blue cheese and [place]. Cream, all kinds: half-and-half, light, heavy, or whipping. Sour cream or cream cheese (even if "light" or low-fat). Nondairy cream substitutes: coffee creamers and sour cream substitutes made with palm, palm kernel, hydrogenated oils, or coconut oil. Beverages  Allowed: Coffee (regular or decaffeinated), tea. Diet carbonated beverages, mineral water. Alcohol: Check with your caregiver. Moderation is recommended.  Avoid: Whole  milk, regular sodas, and juice drinks with added sugar. Condiments  Allowed: All seasonings and condiments. Cocoa powder. "Cream" sauces made with recommended ingredients.  Avoid: Carob powder made with hydrogenated fats. SAMPLE MENU Breakfast   cup orange juice   cup oatmeal  1 slice toast  1 tsp margarine  1 cup skim milk Lunch  [place] sandwich with 2 oz [place], 2 slices bread  Lettuce and tomato slices  Fresh fruit  Carrot sticks  Coffee or tea Snack  Fresh fruit or low-fat crackers Dinner  3 oz lean ground beef  1 baked potato  1 tsp margarine   cup asparagus  Lettuce salad  1 tbs non-creamy dressing   cup peach slices  1 cup skim milk Document Released: 03/16/2008 Document Revised: 12/07/2011 Document Reviewed: 08/07/2013 Exit[employer] Patient Information 2015 [place], [place]. This information is not intended to replace advice given to you by your health care provider. Make sure you discuss any questions you have with your health care provider.

## 2014-08-29 NOTE — Progress Notes (Signed)
Patient ID: Ronnie Hernandez, male   DOB: Feb 18, 1961, 54 y.o.   MRN: [id number]    PCP: [name], NP  No Known Allergies  Chief Complaint  Patient presents with  . Medical Management of Chronic Issues    4 wk follow up     HPI: Patient is a 54 y.o. male seen in the office today to follow up on chronic conditions. Pt with pmh of DM, HTN, gout, arthritis. Pt is taking metformin for diabetes twice daily.  Pt went to the gyms 2 days since last visit.  Was supposed to see RD but it got rescheduled.   Eating increased foods high in sodium  Eating more fried foods   Urine no longer with symptoms, no dysuria  Shortness of breath with increase distance, resolves quickly once he stops.  Review of Systems:  Review of Systems  Constitutional: Negative for activity change, appetite change, fatigue and unexpected weight change.  Respiratory: Negative for cough and shortness of breath.   Cardiovascular: Negative for chest pain, palpitations and leg swelling.  Gastrointestinal: Negative for abdominal pain, diarrhea and constipation.  Genitourinary: Negative for dysuria, frequency, hematuria, discharge and difficulty urinating.  Musculoskeletal: Positive for gait problem. Negative for myalgias.  Skin: Negative for color change and wound.  Neurological: Negative for dizziness and weakness.    Past Medical History  Diagnosis Date  . Hypertension   . Hyperlipidemia   . Diabetes type 2, uncontrolled   . Morbidly obese   . Arthritis     right fingers, both knees  . Gout of hand    Past Surgical History  Procedure Laterality Date  . Unremarkable     Social History:   reports that he quit smoking about 32 years ago. He has never used smokeless tobacco. He reports that he does not drink alcohol or use illicit drugs.  Family History  Problem Relation Age of Onset  . Stomach cancer Maternal Aunt 80  . Colon cancer Maternal Grandfather 42  . Heart attack Father      Medications: Patient's Medications  New Prescriptions   No medications on file  Previous Medications   ALLOPURINOL (ZYLOPRIM) 300 MG TABLET    Take 1 tablet (300 mg total) by mouth 2 (two) times daily.   ASPIRIN 81 MG EC TABLET    TAKE 1 TABLET (81 MG TOTAL) BY MOUTH DAILY.   COLCHICINE 0.6 MG TABLET    Take 1 tablet (0.6 mg total) by mouth 2 (two) times daily.   HYDROCODONE-ACETAMINOPHEN (NORCO/VICODIN) 5-325 MG PER TABLET    Take 1-2 tablets by mouth every 6 (six) hours as needed.   LISINOPRIL (PRINIVIL,ZESTRIL) 10 MG TABLET    Take 1 tablet (10 mg total) by mouth daily.   METFORMIN (GLUCOPHAGE) 500 MG TABLET    Take 500 mg by mouth 2 (two) times daily with a meal.   NYSTATIN-TRIAMCINOLONE OINTMENT (MYCOLOG)    APPLY 1 APPLICATION TOPICALLY 2 (TWO) TIMES DAILY.   TDAP (BOOSTRIX) 5-2.5-18.5 LF-MCG/0.5 INJECTION    Inject 0.5 mLs into the muscle once.   TRIAMCINOLONE OINTMENT (KENALOG) 0.1 %    Apply topically every other day.  Modified Medications   No medications on file  Discontinued Medications   No medications on file     Physical Exam:  Filed Vitals:   08/29/14 1311  BP: 138/80  Pulse: 82  Temp: 98 F (36.7 C)  TempSrc: Oral  Resp: 20  Height: 5\' 8"  (1.727 m)  Weight: 472 lb  12.8 oz (214.461 kg)  SpO2: 97%    Physical Exam  Constitutional: He is oriented to person, place, and time. He appears well-developed and well-nourished. No distress.  Obese  Cardiovascular: Normal rate, regular rhythm and normal heart sounds.   Pulmonary/Chest: Effort normal and breath sounds normal. He has no wheezes. He has no rales.  Musculoskeletal: He exhibits no edema or tenderness.  Has arthritis changes to his digits in his hands, tophi in index finger Abnormal gait due to size and joint stiffness  Neurological: He is alert and oriented to person, place, and time.  Skin: Skin is warm and dry. He is not diaphoretic.  Psychiatric: He has a normal mood and affect.    Labs  reviewed: Basic Metabolic Panel:  Recent Labs  12/25/13 1558 08/01/14 1532  NA 142 141  K 4.1 4.2  CL 105 103  CO2 22 24  GLUCOSE 99 100*  BUN 14 15  CREATININE 1.08 1.05  CALCIUM 9.1 9.3   Liver Function Tests: No results for input(s): AST, ALT, ALKPHOS, BILITOT, PROT, ALBUMIN in the last 8760 hours. No results for input(s): LIPASE, AMYLASE in the last 8760 hours. No results for input(s): AMMONIA in the last 8760 hours. CBC: No results for input(s): WBC, NEUTROABS, HGB, HCT, MCV, PLT in the last 8760 hours. Lipid Panel: No results for input(s): CHOL, HDL, LDLCALC, TRIG, CHOLHDL, LDLDIRECT in the last 8760 hours. TSH: No results for input(s): TSH in the last 8760 hours. A1C: Lab Results  Component Value Date   HGBA1C 5.9* 08/01/2014     Assessment/Plan  1. Essential hypertension -discussed need to reduce sodium for better blood pressure control. conts on lisinopril 10 mg daily  - Comprehensive metabolic panel; Future  2. Type 2 diabetes mellitus without complication -not taking blood sugars at home. A1c reviewed with pt from previous visit. Discussed need for weight loss and lifestyle modification.  - Hemoglobin A1c; Future - CBC with Differential/Platelet; Future  3. Hyperlipidemia -discussed heart healthy diet and  - Comprehensive metabolic panel; Future - Lipid panel; Future  4. Morbidly obese -discussed in detail need to lose weight, discussed barriers to weight loss and making it a priority. Also discuss risk of not losing weight.  -conts to follow up with RD for dietary education   5. Gout of hand, unspecified cause, unspecified chronicity, unspecified laterality -conts on allopurinol and colchine - Uric acid; Future  Follow up in 2 months with fasting blood work prior to visit

## 2014-09-10 ENCOUNTER — Other Ambulatory Visit: Payer: Self-pay | Admitting: Internal Medicine

## 2014-09-12 ENCOUNTER — Ambulatory Visit: Payer: Medicare HMO | Admitting: *Deleted

## 2014-09-26 ENCOUNTER — Ambulatory Visit: Payer: Medicare HMO | Admitting: *Deleted

## 2014-09-30 ENCOUNTER — Telehealth: Payer: Self-pay | Admitting: *Deleted

## 2014-09-30 NOTE — Telephone Encounter (Signed)
Ronnie Hernandez called wanting a letter to explain how much he needed his sister at the house to help him. I told him that I would  have Dr Mariea Clonts write this up for him, because Janett Billow was on vacation. He stated that would be okay, but he has to keep his sister because she helps him so much.

## 2014-10-10 ENCOUNTER — Ambulatory Visit: Payer: Medicare HMO | Admitting: *Deleted

## 2014-10-28 ENCOUNTER — Other Ambulatory Visit: Payer: Medicare HMO

## 2014-10-28 DIAGNOSIS — M109 Gout, unspecified: Secondary | ICD-10-CM

## 2014-10-28 DIAGNOSIS — E119 Type 2 diabetes mellitus without complications: Secondary | ICD-10-CM

## 2014-10-28 DIAGNOSIS — E785 Hyperlipidemia, unspecified: Secondary | ICD-10-CM

## 2014-10-28 DIAGNOSIS — I10 Essential (primary) hypertension: Secondary | ICD-10-CM

## 2014-10-29 LAB — CBC WITH DIFFERENTIAL/PLATELET
BASOS: 0 %
Basophils Absolute: 0 10*3/uL (ref 0.0–0.2)
EOS (ABSOLUTE): 0.3 10*3/uL (ref 0.0–0.4)
Eos: 6 %
HEMOGLOBIN: 14 g/dL (ref 12.6–17.7)
Hematocrit: 41.6 % (ref 37.5–51.0)
Immature Grans (Abs): 0 10*3/uL (ref 0.0–0.1)
Immature Granulocytes: 0 %
Lymphocytes Absolute: 2.1 10*3/uL (ref 0.7–3.1)
Lymphs: 36 %
MCH: 32 pg (ref 26.6–33.0)
MCHC: 33.7 g/dL (ref 31.5–35.7)
MCV: 95 fL (ref 79–97)
Monocytes Absolute: 0.4 10*3/uL (ref 0.1–0.9)
Monocytes: 7 %
NEUTROS ABS: 3 10*3/uL (ref 1.4–7.0)
Neutrophils: 51 %
Platelets: 167 10*3/uL (ref 150–379)
RBC: 4.37 x10E6/uL (ref 4.14–5.80)
RDW: 14.5 % (ref 12.3–15.4)
WBC: 5.9 10*3/uL (ref 3.4–10.8)

## 2014-10-29 LAB — LIPID PANEL
CHOLESTEROL TOTAL: 218 mg/dL — AB (ref 100–199)
Chol/HDL Ratio: 4.4 ratio units (ref 0.0–5.0)
HDL: 50 mg/dL (ref >39)
LDL Calculated: 138 mg/dL — ABNORMAL HIGH (ref 0–99)
Triglycerides: 150 mg/dL — ABNORMAL HIGH (ref 0–149)
VLDL CHOLESTEROL CAL: 30 mg/dL (ref 5–40)

## 2014-10-29 LAB — COMPREHENSIVE METABOLIC PANEL
ALBUMIN: 3.9 g/dL (ref 3.5–5.5)
ALT: 34 IU/L (ref 0–44)
AST: 25 IU/L (ref 0–40)
Albumin/Globulin Ratio: 1.6 (ref 1.1–2.5)
Alkaline Phosphatase: 102 IU/L (ref 39–117)
BUN/Creatinine Ratio: 19 (ref 9–20)
BUN: 19 mg/dL (ref 6–24)
CALCIUM: 9.1 mg/dL (ref 8.7–10.2)
CHLORIDE: 102 mmol/L (ref 97–108)
CO2: 22 mmol/L (ref 18–29)
CREATININE: 1 mg/dL (ref 0.76–1.27)
GFR calc Af Amer: 98 mL/min/{1.73_m2} (ref >59)
GFR calc non Af Amer: 85 mL/min/{1.73_m2} (ref >59)
Globulin, Total: 2.5 g/dL (ref 1.5–4.5)
Glucose: 110 mg/dL — ABNORMAL HIGH (ref 65–99)
Potassium: 4.4 mmol/L (ref 3.5–5.2)
Sodium: 141 mmol/L (ref 134–144)
Total Protein: 6.4 g/dL (ref 6.0–8.5)

## 2014-10-29 LAB — HEMOGLOBIN A1C
Est. average glucose Bld gHb Est-mCnc: 126 mg/dL
Hgb A1c MFr Bld: 6 % — ABNORMAL HIGH (ref 4.8–5.6)

## 2014-10-29 LAB — URIC ACID: URIC ACID: 9.2 mg/dL — AB (ref 3.7–8.6)

## 2014-10-31 ENCOUNTER — Ambulatory Visit: Payer: Medicare HMO | Admitting: *Deleted

## 2014-11-07 ENCOUNTER — Encounter: Payer: Self-pay | Admitting: Nurse Practitioner

## 2014-11-07 ENCOUNTER — Ambulatory Visit (INDEPENDENT_AMBULATORY_CARE_PROVIDER_SITE_OTHER): Payer: Medicare HMO | Admitting: Nurse Practitioner

## 2014-11-07 VITALS — BP 138/80 | HR 84 | Temp 97.7°F | Resp 18 | Ht 68.0 in | Wt >= 6400 oz

## 2014-11-07 DIAGNOSIS — E119 Type 2 diabetes mellitus without complications: Secondary | ICD-10-CM

## 2014-11-07 DIAGNOSIS — I10 Essential (primary) hypertension: Secondary | ICD-10-CM

## 2014-11-07 DIAGNOSIS — J301 Allergic rhinitis due to pollen: Secondary | ICD-10-CM

## 2014-11-07 DIAGNOSIS — E785 Hyperlipidemia, unspecified: Secondary | ICD-10-CM | POA: Diagnosis not present

## 2014-11-07 DIAGNOSIS — M1A9XX1 Chronic gout, unspecified, with tophus (tophi): Secondary | ICD-10-CM

## 2014-11-07 MED ORDER — SIMVASTATIN 10 MG PO TABS: 10.0000 mg | ORAL_TABLET | Freq: Every day | ORAL | Status: DC

## 2014-11-07 MED ORDER — ALLOPURINOL 100 MG PO TABS: ORAL_TABLET | ORAL | Status: DC

## 2014-11-07 NOTE — Patient Instructions (Signed)
Cont allopurinol 300 mg twice daily-- will ADD allopurinol 100 mg daily for 1 week, then increased to 2 tablets of 100 mg for 1 week, then start 300 mg by mouth three times daily  Follow up cholesterol and uric acid level prior to next visit in 3 months

## 2014-11-07 NOTE — Progress Notes (Signed)
Patient ID: Ronnie Hernandez, male   DOB: [birth year], 54 y.o.   MRN: [id number]    PCP: [name], NP  No Known Allergies  Chief Complaint  Patient presents with  . Medical Management of Chronic Issues    2 month follow-up HTN and DM, discuss labs (copy printed)   . Allergic Rhinitis     Scratchy throat, watery eyes, and itching ears     HPI: Patient is a 54 y.o. male seen in the office today for routine follow up. Labs reviewed with pt, uric acid high- pt taking allopurinol 300 mg twice daily and colchine 0.6 mg twice daily  A1c at 6.0- drinking a lot of sodas Insurance company sent someone to his home to do a yearly follow up, worried about depression.  Recommended dental work.  Depression screening done- reports he has good days and bad days. Does not feel like he is depressed. Very remote hx of smoking, rec use when he was [age].   Having issues with sinuses, itchy eyes, scratchy throat and sneezing, better now that it is raining Feel likes this is allergies and has improved at this time.     Advanced Directive information Does patient have an advance directive?: No, Would patient like information on creating an advanced directive?: Yes - Educational materials given Review of Systems:  Review of Systems  Constitutional: Negative for activity change, appetite change, fatigue and unexpected weight change.  HENT: Positive for congestion.   Respiratory: Negative for cough and shortness of breath.   Cardiovascular: Negative for chest pain, palpitations and leg swelling.  Gastrointestinal: Negative for abdominal pain, diarrhea and constipation.  Genitourinary: Negative for dysuria, frequency, hematuria, discharge and difficulty urinating.  Musculoskeletal: Positive for arthralgias (pain in hands at times) and gait problem. Negative for myalgias.  Skin: Negative for color change and wound.  Allergic/Immunologic: Positive for environmental allergies.  Neurological:  Negative for dizziness and weakness.    Past Medical History  Diagnosis Date  . Hypertension   . Hyperlipidemia   . Diabetes type 2, uncontrolled   . Morbidly obese   . Arthritis     right fingers, both knees  . Gout of hand    Past Surgical History  Procedure Laterality Date  . Unremarkable     Social History:   reports that he quit smoking about 32 years ago. He has never used smokeless tobacco. He reports that he does not drink alcohol or use illicit drugs.  Family History  Problem Relation Age of Onset  . Stomach cancer Maternal Aunt 80  . Colon cancer Maternal Grandfather 60  . Heart attack Father     Medications: Patient's Medications  New Prescriptions   No medications on file  Previous Medications   ALLOPURINOL (ZYLOPRIM) 300 MG TABLET    TAKE 1 TABLET (300 MG TOTAL) BY MOUTH 2 (TWO) TIMES DAILY.   ASPIRIN 81 MG EC TABLET    TAKE 1 TABLET (81 MG TOTAL) BY MOUTH DAILY.   COLCHICINE 0.6 MG TABLET    Take 1 tablet (0.6 mg total) by mouth 2 (two) times daily.   HYDROCODONE-ACETAMINOPHEN (NORCO/VICODIN) 5-325 MG PER TABLET    Take 1-2 tablets by mouth every 6 (six) hours as needed.   LISINOPRIL (PRINIVIL,ZESTRIL) 10 MG TABLET    Take 1 tablet (10 mg total) by mouth daily.   METFORMIN (GLUCOPHAGE) 500 MG TABLET    Take 500 mg by mouth 2 (two) times daily with a meal.   NYSTATIN-TRIAMCINOLONE OINTMENT (  MYCOLOG)    APPLY 1 APPLICATION TOPICALLY 2 (TWO) TIMES DAILY.  Modified Medications   No medications on file  Discontinued Medications   TDAP (BOOSTRIX) 5-2.5-18.5 LF-MCG/0.5 INJECTION    Inject 0.5 mLs into the muscle once.   TRIAMCINOLONE OINTMENT (KENALOG) 0.1 %    Apply topically every other day.     Physical Exam:  Filed Vitals:   11/07/14 1446  BP: 138/80  Pulse: 84  Temp: 97.7 F (36.5 C)  TempSrc: Oral  Resp: 18  Height: 5\' 8"  (1.727 m)  Weight: 483 lb (219.087 kg)  SpO2: 97%    Physical Exam  Constitutional: He is oriented to person, place, and  time. He appears well-developed and well-nourished. No distress.  Obese  Cardiovascular: Normal rate, regular rhythm and normal heart sounds.   Pulmonary/Chest: Effort normal and breath sounds normal. He has no wheezes. He has no rales.  Abdominal: Bowel sounds are normal.  Obese abdomen   Musculoskeletal: He exhibits no edema or tenderness.  Has arthritis changes to his digits in his hands, tophi in index finger Abnormal gait due to size and joint stiffness  Neurological: He is alert and oriented to person, place, and time.  Skin: Skin is warm and dry. He is not diaphoretic.  Psychiatric: He has a normal mood and affect.    Labs reviewed: Basic Metabolic Panel:  Recent Labs  12/25/13 1558 08/01/14 1532 10/28/14 0846  NA 142 141 141  K 4.1 4.2 4.4  CL 105 103 102  CO2 22 24 22   GLUCOSE 99 100* 110*  BUN 14 15 19   CREATININE 1.08 1.05 1.00  CALCIUM 9.1 9.3 9.1   Liver Function Tests:  Recent Labs  10/28/14 0846  AST 25  ALT 34  ALKPHOS 102  BILITOT <0.2  PROT 6.4   No results for input(s): LIPASE, AMYLASE in the last 8760 hours. No results for input(s): AMMONIA in the last 8760 hours. CBC:  Recent Labs  10/28/14 0846  WBC 5.9  NEUTROABS 3.0  HCT 41.6   Lipid Panel:  Recent Labs  10/28/14 0846  CHOL 218*  HDL 50  LDLCALC 138*  TRIG 150*  CHOLHDL 4.4   TSH: No results for input(s): TSH in the last 8760 hours. A1C: Lab Results  Component Value Date   HGBA1C 6.0* 10/28/2014     Assessment/Plan 1. Hyperlipidemia -discussed cholesterol has gone up. Pt opted for medication verses diet changes alone. Does realize he needs to make major lifestyle modifications.  - simvastatin (ZOCOR) 10 MG tablet; Take 1 tablet (10 mg total) by mouth at bedtime.  Dispense: 30 tablet; Refill: 3 - Comprehensive metabolic panel; Future - Lipid panel; Future  2. Gout with tophus, unspecified cause, unspecified chronicity, unspecified site -worsening uric acid level,  will increase allopurinol and titrate up to 300 mg TID  - allopurinol (ZYLOPRIM) 100 MG tablet; Add 100 mg to your already 300 mg allopurinol twice daily, increase to 200 mg after 1 week then start 300 mg three times daily  Dispense: 30 tablet; Refill: 6 - Uric acid; Future  3. Morbid obesity -ongoing weight gain, has not gotten back into exercise or eating better. Discussed setting an obtainable goal for himself and sticking with him -home aide paperwork signed due to needing increased assistance in the home at this point   4. Type 2 diabetes mellitus without complication -D9I at 6.0, maintained on metformin 500 mg twice daily   5. Essential hypertension -blood pressure at 138/80, discussed dietary  modification for optimal control. Limiting salt and foods high in sodium.   6. Allergic rhinitis  - improved at this time, will monitor -pt may take Claritin 10 mg daily as needed

## 2014-11-23 ENCOUNTER — Other Ambulatory Visit: Payer: Self-pay | Admitting: Internal Medicine

## 2014-11-27 ENCOUNTER — Other Ambulatory Visit: Payer: Self-pay | Admitting: *Deleted

## 2014-11-27 DIAGNOSIS — I10 Essential (primary) hypertension: Secondary | ICD-10-CM

## 2014-11-27 MED ORDER — LISINOPRIL 10 MG PO TABS: 10.0000 mg | ORAL_TABLET | Freq: Every day | ORAL | Status: DC

## 2014-11-28 ENCOUNTER — Ambulatory Visit: Payer: Medicare HMO | Admitting: *Deleted

## 2014-12-11 ENCOUNTER — Other Ambulatory Visit: Payer: Self-pay | Admitting: Internal Medicine

## 2015-01-30 ENCOUNTER — Other Ambulatory Visit: Payer: Medicare HMO

## 2015-01-30 DIAGNOSIS — E785 Hyperlipidemia, unspecified: Secondary | ICD-10-CM

## 2015-01-30 DIAGNOSIS — M1A9XX1 Chronic gout, unspecified, with tophus (tophi): Secondary | ICD-10-CM

## 2015-01-31 LAB — LIPID PANEL
CHOL/HDL RATIO: 4 ratio (ref 0.0–5.0)
CHOLESTEROL TOTAL: 175 mg/dL (ref 100–199)
HDL: 44 mg/dL (ref >39)
LDL Calculated: 102 mg/dL — ABNORMAL HIGH (ref 0–99)
Triglycerides: 144 mg/dL (ref 0–149)
VLDL Cholesterol Cal: 29 mg/dL (ref 5–40)

## 2015-01-31 LAB — COMPREHENSIVE METABOLIC PANEL
ALK PHOS: 107 IU/L (ref 39–117)
ALT: 37 IU/L (ref 0–44)
AST: 26 IU/L (ref 0–40)
Albumin/Globulin Ratio: 1.3 (ref 1.1–2.5)
Albumin: 3.7 g/dL (ref 3.5–5.5)
BUN / CREAT RATIO: 11 (ref 9–20)
BUN: 13 mg/dL (ref 6–24)
Bilirubin Total: 0.4 mg/dL (ref 0.0–1.2)
CALCIUM: 9 mg/dL (ref 8.7–10.2)
CHLORIDE: 100 mmol/L (ref 97–108)
CO2: 22 mmol/L (ref 18–29)
Creatinine, Ser: 1.14 mg/dL (ref 0.76–1.27)
GFR calc non Af Amer: 73 mL/min/{1.73_m2} (ref >59)
GFR, EST AFRICAN AMERICAN: 84 mL/min/{1.73_m2} (ref >59)
Globulin, Total: 2.8 g/dL (ref 1.5–4.5)
Glucose: 107 mg/dL — ABNORMAL HIGH (ref 65–99)
Potassium: 4.3 mmol/L (ref 3.5–5.2)
Sodium: 140 mmol/L (ref 134–144)
Total Protein: 6.5 g/dL (ref 6.0–8.5)

## 2015-01-31 LAB — URIC ACID: Uric Acid: 6 mg/dL (ref 3.7–8.6)

## 2015-02-13 ENCOUNTER — Ambulatory Visit: Payer: Medicare HMO | Admitting: Nurse Practitioner

## 2015-02-25 ENCOUNTER — Ambulatory Visit (INDEPENDENT_AMBULATORY_CARE_PROVIDER_SITE_OTHER): Payer: Medicare HMO | Admitting: Nurse Practitioner

## 2015-02-25 ENCOUNTER — Encounter: Payer: Self-pay | Admitting: Nurse Practitioner

## 2015-02-25 VITALS — BP 142/82 | HR 106 | Temp 97.8°F | Resp 22 | Ht 68.0 in | Wt >= 6400 oz

## 2015-02-25 DIAGNOSIS — M1A9XX1 Chronic gout, unspecified, with tophus (tophi): Secondary | ICD-10-CM

## 2015-02-25 DIAGNOSIS — E119 Type 2 diabetes mellitus without complications: Secondary | ICD-10-CM

## 2015-02-25 DIAGNOSIS — I10 Essential (primary) hypertension: Secondary | ICD-10-CM

## 2015-02-25 DIAGNOSIS — E785 Hyperlipidemia, unspecified: Secondary | ICD-10-CM

## 2015-02-25 DIAGNOSIS — Z23 Encounter for immunization: Secondary | ICD-10-CM

## 2015-02-25 DIAGNOSIS — M1990 Unspecified osteoarthritis, unspecified site: Secondary | ICD-10-CM | POA: Diagnosis not present

## 2015-02-25 MED ORDER — ALLOPURINOL 300 MG PO TABS: ORAL_TABLET | ORAL | Status: DC

## 2015-02-25 MED ORDER — ALLOPURINOL 100 MG PO TABS: ORAL_TABLET | ORAL | Status: DC

## 2015-02-25 NOTE — Patient Instructions (Signed)
Increase activity-- anything is better than nothing   No fried food. Chose baked, grilled, broiled Chose vegetables, fruit, and salads Limit white carbs, choice whole wheat, sweet potatoes  Increase water intake  Follow up in 4 months with blood work prior to visit

## 2015-02-25 NOTE — Progress Notes (Signed)
Patient ID: Ronnie Hernandez, male   DOB: [birth year], 54 y.o.   MRN: [id number]    PCP: [name], NP  No Known Allergies  Chief Complaint  Patient presents with  . Medical Management of Chronic Issues    feels bloated, wants something for fluid     HPI: Patient is a 54 y.o. male seen in the office today to follow up on chronic conditions. His sister had to move out because she has had a previous conviction. Feels like he has not been doing good since then. Taking allopurinol 400 mg twice daily vs 300 mg twice daily and 200 mg midday Getting down in the dumps and eating more. Taking zocor 10 mg daily started at last visit due to elevated cholesterol.  Pt reports his joints are hurting him. Left knee, ankle and having some back discomfort.  Has arthritis cream that helps.  Taking Lisinopril 10 mg daily-- does not take blood pressure at home.  Reports shortness of breath with movement and activity. Very sedentary.  No cough, has to sit up to sleep-- this has been going on for years. No shortness of breath when he lays back.  No chest pains.  Bowels moving good.  Some pain with urination, improved with cranberry juice.    Advanced Directive information Does patient have an advance directive?: No, Would patient like information on creating an advanced directive?: No - patient declined information Review of Systems:  Review of Systems  Constitutional: Negative for activity change, appetite change, fatigue and unexpected weight change.  HENT: Positive for congestion.   Respiratory: Positive for shortness of breath (with activity). Negative for cough.   Cardiovascular: Negative for chest pain, palpitations and leg swelling.  Gastrointestinal: Negative for abdominal pain, diarrhea and constipation.  Genitourinary: Negative for dysuria, frequency, hematuria, discharge and difficulty urinating.  Musculoskeletal: Positive for arthralgias (pain in hands at times) and gait problem.  Negative for myalgias.  Skin: Negative for color change and wound.  Neurological: Negative for dizziness and weakness.  Psychiatric/Behavioral: Negative for behavioral problems. The patient is not nervous/anxious.     Past Medical History  Diagnosis Date  . Hypertension   . Hyperlipidemia   . Diabetes type 2, uncontrolled   . Morbidly obese   . Arthritis     right fingers, both knees  . Gout of hand    Past Surgical History  Procedure Laterality Date  . Unremarkable     Social History:   reports that he quit smoking about 33 years ago. He has never used smokeless tobacco. He reports that he does not drink alcohol or use illicit drugs.  Family History  Problem Relation Age of Onset  . Stomach cancer Maternal Aunt 80  . Colon cancer Maternal Grandfather 66  . Heart attack Father     Medications: Patient's Medications  New Prescriptions   No medications on file  Previous Medications   ASPIRIN 81 MG EC TABLET    TAKE 1 TABLET (81 MG TOTAL) BY MOUTH DAILY.   COLCHICINE 0.6 MG TABLET    TAKE 1 TABLET (0.6 MG TOTAL) BY MOUTH 2 (TWO) TIMES DAILY.   HYDROCODONE-ACETAMINOPHEN (NORCO/VICODIN) 5-325 MG PER TABLET    Take 1-2 tablets by mouth every 6 (six) hours as needed.   LISINOPRIL (PRINIVIL,ZESTRIL) 10 MG TABLET    Take 1 tablet (10 mg total) by mouth daily.   METFORMIN (GLUCOPHAGE) 500 MG TABLET    Take 500 mg by mouth 2 (two) times daily with  a meal.   NYSTATIN-TRIAMCINOLONE OINTMENT (MYCOLOG)    APPLY 1 APPLICATION TOPICALLY 2 (TWO) TIMES DAILY.   SIMVASTATIN (ZOCOR) 10 MG TABLET    Take 1 tablet (10 mg total) by mouth at bedtime.  Modified Medications   Modified Medication Previous Medication   ALLOPURINOL (ZYLOPRIM) 100 MG TABLET allopurinol (ZYLOPRIM) 100 MG tablet      To take 200 mg mid day- take in addition to 300 mg twice daily    Add 100 mg to your already 300 mg allopurinol twice daily, increase to 200 mg after 1 week then start 300 mg three times daily   ALLOPURINOL  (ZYLOPRIM) 300 MG TABLET allopurinol (ZYLOPRIM) 300 MG tablet      TAKE 1 TABLET (300 MG TOTAL) BY MOUTH 2 (TWO) TIMES DAILY.    TAKE 1 TABLET (300 MG TOTAL) BY MOUTH 2 (TWO) TIMES DAILY.  Discontinued Medications   LISINOPRIL (PRINIVIL,ZESTRIL) 5 MG TABLET    TAKE 1 TABLET (5 MG TOTAL) BY MOUTH DAILY.     Physical Exam:  Filed Vitals:   02/25/15 1500  BP: 142/82  Pulse: 106  Temp: 97.8 F (36.6 C)  TempSrc: Oral  Resp: 22  Height: 5\' 8"  (1.727 m)  Weight: 490 lb 3.2 oz (222.353 kg)  SpO2: 95%  Body mass index is 74.55 kg/(m^2).   Physical Exam  Constitutional: He is oriented to person, place, and time. He appears well-developed and well-nourished. No distress.  Obese  HENT:  Mouth/Throat: Oropharynx is clear and moist. No oropharyngeal exudate.  Neck: Normal range of motion. Neck supple.  Cardiovascular: Normal rate, regular rhythm and normal heart sounds.   Pulmonary/Chest: Effort normal and breath sounds normal. He has no wheezes. He has no rales.  Abdominal: Soft. Bowel sounds are normal.  Obese abdomen   Musculoskeletal: He exhibits no edema or tenderness.  Has arthritis changes to his digits in his hands, tophi in index finger Abnormal gait due to size and joint stiffness  Neurological: He is alert and oriented to person, place, and time.  Skin: Skin is warm and dry. He is not diaphoretic.  Psychiatric: He has a normal mood and affect.    Labs reviewed: Basic Metabolic Panel:  Recent Labs  08/01/14 1532 10/28/14 0846 01/30/15 0833  NA 141 141 140  K 4.2 4.4 4.3  CL 103 102 100  CO2 24 22 22   GLUCOSE 100* 110* 107*  BUN 15 19 13   CREATININE 1.05 1.00 1.14  CALCIUM 9.3 9.1 9.0   Liver Function Tests:  Recent Labs  10/28/14 0846 01/30/15 0833  AST 25 26  ALT 34 37  ALKPHOS 102 107  BILITOT <0.2 0.4  PROT 6.4 6.5   No results for input(s): LIPASE, AMYLASE in the last 8760 hours. No results for input(s): AMMONIA in the last 8760  hours. CBC:  Recent Labs  10/28/14 0846  WBC 5.9  NEUTROABS 3.0  HCT 41.6   Lipid Panel:  Recent Labs  10/28/14 0846 01/30/15 0833  CHOL 218* 175  HDL 50 44  LDLCALC 138* 102*  TRIG 150* 144  CHOLHDL 4.4 4.0   TSH: No results for input(s): TSH in the last 8760 hours. A1C: Lab Results  Component Value Date   HGBA1C 6.0* 10/28/2014     Assessment/Plan 1. Gout with tophus, unspecified cause, unspecified chronicity, unspecified site -uric acid improved to 6 from 9.2, pt reports joints in hands feeling better. To cont allopurinol 300 mg twice daily with 200 mg mid day -  allopurinol (ZYLOPRIM) 300 MG tablet; TAKE 1 TABLET (300 MG TOTAL) BY MOUTH 2 (TWO) TIMES DAILY.  Dispense: 180 tablet; Refill: 1 - allopurinol (ZYLOPRIM) 100 MG tablet; To take 200 mg mid day- take in addition to 300 mg twice daily  Dispense: 60 tablet; Refill: 6  2. Essential hypertension -improved on recheck, 138/82. To cont current medication.  - Basic metabolic panel; Future  3. Type 2 diabetes mellitus without complication -to limit sugars, sweets and fried foods. More complex carbs and less simple carbs.  -conts on metformin twice daily  - Hemoglobin A1c; Future  4. Hyperlipidemia -improved LDL on statin, no side effects noted. Will cont at this time  5. Morbidly obese -again discussed increase in activity and diet modifications.  -pt does not seem motivate to lose weight as he has a lot of excuses as to why he is not able to eat right or increase activity. Discussed risk of heart attack and stroke due to increased weight and stress -pt also with increase in arthritis with weight gain.   6. Osteoarthritis  Worse with weight gain, discussed weight is worsening symptoms. May use tylenol 650 mg every 6 hours as needed  Follow up in 4 months with fasting blood work prior to visit  [employer]. Harle Battiest  [facility] [phone] 8 am - 5  pm) [phone] (after hours)

## 2015-03-16 ENCOUNTER — Other Ambulatory Visit: Payer: Self-pay | Admitting: Nurse Practitioner

## 2015-04-22 ENCOUNTER — Telehealth: Payer: Self-pay | Admitting: *Deleted

## 2015-04-22 NOTE — Telephone Encounter (Signed)
Received fax from [place] #[phone] Fax: [phone] for Therapeutic/Diabetic Shoes. Form given to Sherrie Mustache, NP to review and sign.

## 2015-04-25 ENCOUNTER — Other Ambulatory Visit: Payer: Self-pay | Admitting: Nurse Practitioner

## 2015-04-25 DIAGNOSIS — I10 Essential (primary) hypertension: Secondary | ICD-10-CM | POA: Diagnosis not present

## 2015-04-25 DIAGNOSIS — H35033 Hypertensive retinopathy, bilateral: Secondary | ICD-10-CM | POA: Diagnosis not present

## 2015-04-25 DIAGNOSIS — H25013 Cortical age-related cataract, bilateral: Secondary | ICD-10-CM | POA: Diagnosis not present

## 2015-04-25 DIAGNOSIS — Z7984 Long term (current) use of oral hypoglycemic drugs: Secondary | ICD-10-CM | POA: Diagnosis not present

## 2015-04-25 DIAGNOSIS — H11423 Conjunctival edema, bilateral: Secondary | ICD-10-CM | POA: Diagnosis not present

## 2015-04-25 DIAGNOSIS — H18413 Arcus senilis, bilateral: Secondary | ICD-10-CM | POA: Diagnosis not present

## 2015-04-25 DIAGNOSIS — E119 Type 2 diabetes mellitus without complications: Secondary | ICD-10-CM | POA: Diagnosis not present

## 2015-04-25 DIAGNOSIS — H11153 Pinguecula, bilateral: Secondary | ICD-10-CM | POA: Diagnosis not present

## 2015-04-25 DIAGNOSIS — H5211 Myopia, right eye: Secondary | ICD-10-CM | POA: Diagnosis not present

## 2015-04-25 DIAGNOSIS — H2513 Age-related nuclear cataract, bilateral: Secondary | ICD-10-CM | POA: Diagnosis not present

## 2015-06-06 DIAGNOSIS — E1159 Type 2 diabetes mellitus with other circulatory complications: Secondary | ICD-10-CM | POA: Diagnosis not present

## 2015-06-06 DIAGNOSIS — E119 Type 2 diabetes mellitus without complications: Secondary | ICD-10-CM | POA: Diagnosis not present

## 2015-06-25 ENCOUNTER — Telehealth: Payer: Self-pay | Admitting: *Deleted

## 2015-06-25 NOTE — Telephone Encounter (Signed)
Received fax form from [facility] [phone] Fax: [phone] for Diabetic Shoes (A5500) 2 $141.14 and for Inserts [phone]) 6 $172.74. Shoe style [id number] Suzan Slick Size 14 Width 3E  Filled out and given to Janett Billow to review and sign.  To be faxed back to [employer] [phone]  Fax [phone]

## 2015-06-27 ENCOUNTER — Other Ambulatory Visit: Payer: Medicare HMO

## 2015-07-03 ENCOUNTER — Ambulatory Visit: Payer: Medicare HMO | Admitting: Nurse Practitioner

## 2015-07-21 ENCOUNTER — Other Ambulatory Visit: Payer: Self-pay | Admitting: Nurse Practitioner

## 2015-07-25 DIAGNOSIS — E119 Type 2 diabetes mellitus without complications: Secondary | ICD-10-CM | POA: Diagnosis not present

## 2015-07-25 DIAGNOSIS — E1159 Type 2 diabetes mellitus with other circulatory complications: Secondary | ICD-10-CM | POA: Diagnosis not present

## 2015-07-28 ENCOUNTER — Other Ambulatory Visit: Payer: Medicare HMO

## 2015-07-31 ENCOUNTER — Ambulatory Visit: Payer: Medicare HMO | Admitting: Nurse Practitioner

## 2015-08-16 ENCOUNTER — Other Ambulatory Visit: Payer: Self-pay | Admitting: Nurse Practitioner

## 2015-08-18 ENCOUNTER — Other Ambulatory Visit: Payer: Medicare HMO

## 2015-08-18 DIAGNOSIS — I10 Essential (primary) hypertension: Secondary | ICD-10-CM

## 2015-08-18 DIAGNOSIS — E119 Type 2 diabetes mellitus without complications: Secondary | ICD-10-CM

## 2015-08-19 LAB — BASIC METABOLIC PANEL
BUN/Creatinine Ratio: 12 (ref 9–20)
BUN: 17 mg/dL (ref 6–24)
CHLORIDE: 105 mmol/L (ref 96–106)
CO2: 20 mmol/L (ref 18–29)
Calcium: 8.6 mg/dL — ABNORMAL LOW (ref 8.7–10.2)
Creatinine, Ser: 1.39 mg/dL — ABNORMAL HIGH (ref 0.76–1.27)
GFR calc Af Amer: 65 mL/min/{1.73_m2} (ref >59)
GFR, EST NON AFRICAN AMERICAN: 57 mL/min/{1.73_m2} — AB (ref >59)
Glucose: 99 mg/dL (ref 65–99)
POTASSIUM: 4.5 mmol/L (ref 3.5–5.2)
SODIUM: 142 mmol/L (ref 134–144)

## 2015-08-19 LAB — HEMOGLOBIN A1C
ESTIMATED AVERAGE GLUCOSE: 120 mg/dL
HEMOGLOBIN A1C: 5.8 % — AB (ref 4.8–5.6)

## 2015-08-21 ENCOUNTER — Encounter: Payer: Self-pay | Admitting: Nurse Practitioner

## 2015-08-21 ENCOUNTER — Ambulatory Visit (INDEPENDENT_AMBULATORY_CARE_PROVIDER_SITE_OTHER): Payer: Medicare HMO | Admitting: Nurse Practitioner

## 2015-08-21 VITALS — BP 142/82 | HR 83 | Temp 97.8°F | Resp 22 | Ht 68.0 in | Wt >= 6400 oz

## 2015-08-21 DIAGNOSIS — M1990 Unspecified osteoarthritis, unspecified site: Secondary | ICD-10-CM | POA: Diagnosis not present

## 2015-08-21 DIAGNOSIS — M1A9XX1 Chronic gout, unspecified, with tophus (tophi): Secondary | ICD-10-CM

## 2015-08-21 DIAGNOSIS — E119 Type 2 diabetes mellitus without complications: Secondary | ICD-10-CM | POA: Diagnosis not present

## 2015-08-21 DIAGNOSIS — E785 Hyperlipidemia, unspecified: Secondary | ICD-10-CM | POA: Diagnosis not present

## 2015-08-21 DIAGNOSIS — I10 Essential (primary) hypertension: Secondary | ICD-10-CM

## 2015-08-21 NOTE — Patient Instructions (Signed)
Tylenol (acetaminophen) 500 mg 1-2 tablets every 6 hours as needed for pain. Max of 6 tablets in 24 hours  May also use Biofreeze, bengay, icyhot to knee.   Start exercise 30 mins 5 days a week  Take blood pressure medication prior to visit  Heart healthy eating- cut back on calories  Take medication has prescribed- gout medication included.

## 2015-08-21 NOTE — Progress Notes (Signed)
Patient ID: Ronnie Hernandez, male   DOB: Sep 12, 1960, 55 y.o.   MRN: [id number]    PCP: [name], NP  Advanced Directive information Does patient have an advance directive?: No, Would patient like information on creating an advanced directive?: Yes - Educational materials given  No Known Allergies  Chief Complaint  Patient presents with  . Medical Management of Chronic Issues    4 mo f/u     HPI: Patient is a 55 y.o. male seen in the office today for follow up on chronic conditions.  Pt has had home situation. Was having his sister help him but she was not able to stay with him. Going to go through an agency to get him increase assistance.  Recent gout flare- not taking allopurinol and colchicine daily, skipping days of taking medication.   Has not taken blood pressure medication this morning.  Does not check blood pressure at home.   Attempting to cut back on amount of food. Increasing fruit and vegetables.   Increased pain to right knee- no injury noted. does not take medication for this at this time. Someone told him to get an injection in knee. Pain has been off and on for several years. Took advil which helped. Muscle rubs in the past have been beneficial as well.    Review of Systems:  Review of Systems  Constitutional: Negative for activity change, appetite change, fatigue and unexpected weight change.  Respiratory: Positive for shortness of breath (with activity). Negative for cough.   Cardiovascular: Negative for chest pain, palpitations and leg swelling.  Gastrointestinal: Negative for abdominal pain, diarrhea and constipation.  Genitourinary: Negative for dysuria, frequency, hematuria, discharge and difficulty urinating.  Musculoskeletal: Positive for arthralgias (pain in hands and left knee) and gait problem. Negative for myalgias.  Skin: Negative for color change and wound.  Neurological: Negative for dizziness and weakness.  Psychiatric/Behavioral:  Negative for behavioral problems. The patient is not nervous/anxious.     Past Medical History  Diagnosis Date  . Hypertension   . Hyperlipidemia   . Diabetes type 2, uncontrolled ([place])   . Morbidly obese ([place])   . Arthritis     right fingers, both knees  . Gout of hand    Past Surgical History  Procedure Laterality Date  . Unremarkable     Social History:   reports that he quit smoking about 33 years ago. He has never used smokeless tobacco. He reports that he does not drink alcohol or use illicit drugs.  Family History  Problem Relation Age of Onset  . Stomach cancer Maternal Aunt 80  . Colon cancer Maternal Grandfather 25  . Heart attack Father     Medications: Patient's Medications  New Prescriptions   No medications on file  Previous Medications   ALLOPURINOL (ZYLOPRIM) 100 MG TABLET    To take 200 mg mid day- take in addition to 300 mg twice daily   ALLOPURINOL (ZYLOPRIM) 300 MG TABLET    TAKE 1 TABLET (300 MG TOTAL) BY MOUTH 2 (TWO) TIMES DAILY.   ASPIRIN 81 MG EC TABLET    TAKE 1 TABLET (81 MG TOTAL) BY MOUTH DAILY.   COLCHICINE 0.6 MG TABLET    TAKE 1 TABLET (0.6 MG TOTAL) BY MOUTH 2 (TWO) TIMES DAILY.   LISINOPRIL (PRINIVIL,ZESTRIL) 10 MG TABLET    Take 1 tablet (10 mg total) by mouth daily.   METFORMIN (GLUCOPHAGE) 500 MG TABLET    Take 1 tablet by mouth twice a  day with food.   NYSTATIN-TRIAMCINOLONE OINTMENT (MYCOLOG)    APPLY 1 APPLICATION TOPICALLY 2 (TWO) TIMES DAILY.   SIMVASTATIN (ZOCOR) 10 MG TABLET    Take 1 tablet (10 mg total) by mouth at bedtime.  Modified Medications   No medications on file  Discontinued Medications   ALLOPURINOL (ZYLOPRIM) 300 MG TABLET    TAKE 1 TABLET (300 MG TOTAL) BY MOUTH 2 (TWO) TIMES DAILY.   HYDROCODONE-ACETAMINOPHEN (NORCO/VICODIN) 5-325 MG PER TABLET    Take 1-2 tablets by mouth every 6 (six) hours as needed.      Physical Exam:  Filed Vitals:   08/21/15 1307  BP: 142/82  Pulse: 83  Temp: 97.8 F (36.6 C)    TempSrc: Oral  Resp: 22  Height: 5\' 8"  (1.727 m)  Weight: 472 lb 9.6 oz (214.37 kg)  SpO2: 98%   Body mass index is 71.88 kg/(m^2).  Physical Exam  Constitutional: He is oriented to person, place, and time. He appears well-developed and well-nourished. No distress.  Obese  HENT:  Mouth/Throat: Oropharynx is clear and moist. No oropharyngeal exudate.  Neck: Normal range of motion. Neck supple.  Cardiovascular: Normal rate, regular rhythm and normal heart sounds.   Pulmonary/Chest: Effort normal and breath sounds normal. He has no wheezes. He has no rales.  Abdominal: Soft. Bowel sounds are normal.  Obese abdomen   Musculoskeletal: He exhibits no edema or tenderness.  Has arthritis changes to his digits in his hands, tophi in index finger Abnormal gait due to size and joint stiffness  Neurological: He is alert and oriented to person, place, and time.  Skin: Skin is warm and dry. He is not diaphoretic.  Psychiatric: He has a normal mood and affect.    Labs reviewed: Basic Metabolic Panel:  Recent Labs  10/28/14 0846 01/30/15 0833 08/18/15 0935  NA 141 140 142  K 4.4 4.3 4.5  CL 102 100 105  CO2 22 22 20   GLUCOSE 110* 107* 99  BUN 19 13 17   CREATININE 1.00 1.14 1.39*  CALCIUM 9.1 9.0 8.6*   Liver Function Tests:  Recent Labs  10/28/14 0846 01/30/15 0833  AST 25 26  ALT 34 37  ALKPHOS 102 107  BILITOT <0.2 0.4  PROT 6.4 6.5  ALBUMIN 3.9 3.7   No results for input(s): LIPASE, AMYLASE in the last 8760 hours. No results for input(s): AMMONIA in the last 8760 hours. CBC:  Recent Labs  10/28/14 0846  WBC 5.9  NEUTROABS 3.0  HCT 41.6  MCV 95  PLT 167   Lipid Panel:  Recent Labs  10/28/14 0846 01/30/15 0833  CHOL 218* 175  HDL 50 44  LDLCALC 138* 102*  TRIG 150* 144  CHOLHDL 4.4 4.0   TSH: No results for input(s): TSH in the last 8760 hours. A1C: Lab Results  Component Value Date   HGBA1C 5.8* 08/18/2015     Assessment/Plan 1. Essential  hypertension -did not take medication today, discussed lifestyle modifications and to take medication prior to visit. To cont lisinopril 10 mg daily  2. Type 2 diabetes mellitus without complication, without long-term current use of insulin (HCC) -A1c stable. To cont metformin as prescribed. Diabetic diet and routine exercise encouraged.   3. Gout with tophus, unspecified cause, unspecified chronicity, unspecified site -discussed medication compliance as well as low purine diet. Cont current medication - follow up Uric Acid prior to next visit   4. Morbid obesity due to excess calories ([place]) -need for weight loss, discuss exercise  and diet  5. Osteoarthritis, unspecified osteoarthritis type, unspecified site -worse to right knee, weight loss would be beneficial - to start using tylenol as needed -encouraged muscle rub and ice as needed -to follow up if symptoms worsen, tylenol not effective  6. Hyperlipidemia -cont on zocor daily, encouraged lifestyle modification with diet and exercise.  - Lipid Panel prior to next visit  Follow up in 3 months with Dr Eulas Post with fasting lab work prior to visit.   Carlos American. Harle Battiest  [facility] [phone] 8 am - 5 pm) [phone] (after hours)

## 2015-08-26 ENCOUNTER — Telehealth: Payer: Self-pay | Admitting: *Deleted

## 2015-08-26 MED ORDER — AMBULATORY NON FORMULARY MEDICATION: Status: DC

## 2015-08-26 NOTE — Telephone Encounter (Signed)
Okay for script for lift chair with dx of arthritis

## 2015-08-26 NOTE — Telephone Encounter (Signed)
Patient called and requested a Rx for a Lift Chair. States he is not comfortable anymore just sitting in a chair due to Arthritis. Please Advise.

## 2015-08-26 NOTE — Telephone Encounter (Signed)
Patient notified and will pick up 

## 2015-11-24 ENCOUNTER — Other Ambulatory Visit: Payer: Medicare HMO

## 2015-11-26 ENCOUNTER — Ambulatory Visit: Payer: Medicare HMO | Admitting: Internal Medicine

## 2015-11-28 ENCOUNTER — Encounter: Payer: Self-pay | Admitting: Nurse Practitioner

## 2015-12-05 ENCOUNTER — Ambulatory Visit (INDEPENDENT_AMBULATORY_CARE_PROVIDER_SITE_OTHER): Payer: Medicare HMO | Admitting: Internal Medicine

## 2015-12-05 ENCOUNTER — Encounter: Payer: Self-pay | Admitting: Internal Medicine

## 2015-12-05 VITALS — BP 132/78 | HR 80 | Temp 98.0°F | Ht 68.0 in | Wt >= 6400 oz

## 2015-12-05 DIAGNOSIS — I10 Essential (primary) hypertension: Secondary | ICD-10-CM | POA: Diagnosis not present

## 2015-12-05 DIAGNOSIS — M1A9XX1 Chronic gout, unspecified, with tophus (tophi): Secondary | ICD-10-CM | POA: Diagnosis not present

## 2015-12-05 DIAGNOSIS — L259 Unspecified contact dermatitis, unspecified cause: Secondary | ICD-10-CM

## 2015-12-05 DIAGNOSIS — E119 Type 2 diabetes mellitus without complications: Secondary | ICD-10-CM

## 2015-12-05 DIAGNOSIS — E785 Hyperlipidemia, unspecified: Secondary | ICD-10-CM | POA: Diagnosis not present

## 2015-12-05 DIAGNOSIS — M150 Primary generalized (osteo)arthritis: Secondary | ICD-10-CM | POA: Diagnosis not present

## 2015-12-05 DIAGNOSIS — M159 Polyosteoarthritis, unspecified: Secondary | ICD-10-CM

## 2015-12-05 MED ORDER — NYSTATIN-TRIAMCINOLONE 100000-0.1 UNIT/GM-% EX OINT: TOPICAL_OINTMENT | CUTANEOUS | Status: DC

## 2015-12-05 MED ORDER — FUROSEMIDE 20 MG PO TABS: 20.0000 mg | ORAL_TABLET | Freq: Every day | ORAL | Status: DC

## 2015-12-05 MED ORDER — METHYLPREDNISOLONE ACETATE 40 MG/ML IJ SUSP
40.0000 mg | Freq: Once | INTRAMUSCULAR | Status: AC
  Administered 2015-12-05: 40 mg via INTRAMUSCULAR

## 2015-12-05 NOTE — Patient Instructions (Addendum)
Depo-medrol injection given today  Continue current medications as ordered  Will call with lab results  Continue diet and exercise program  Follow up in 3 mos for routine visit with Sherrie Mustache, [facility]

## 2015-12-05 NOTE — Progress Notes (Signed)
Patient ID: Ronnie Hernandez, male   DOB: 19-Jun-1961, 55 y.o.   MRN: [id number]    Location:  [facility] Place of Service: OFFICE  Chief Complaint  Patient presents with  . Medication Management    3 month follow up, wants to discuss getting help in the home, also wants fluid pill    HPI:  55 yo male seen today for f/u. He reports increased arthralgias in hands, back and left hip. He needs HH aide to assist with ADLs. He has difficulty with dressing, bathing and grooming. He has increased itching  Morbid obesity - he is discouraged due to losing personal trainer. Weight has actually improved since March (down 28 lbs since Sept 2016). He plans to get back to the gym  HTN - BP stable on lisinopril. Takes ASA daily  Hyperlipidemia - stable on simvastatin. LDL 102  DM - he has not checked BS at home consistently. No low BS reactions. Takes metformin. No numbness or tingling but does have burning in feet at times. A1c 5.8%. Last eye exam 04/25/15.  Gout - last gout flare 2 weeks ago in buttocks and toe. Takes allopurinol and colchicine. Uric acid 6.0  Past Medical History  Diagnosis Date  . Hypertension   . Hyperlipidemia   . Diabetes type 2, uncontrolled ([place])   . Morbidly obese ([place])   . Arthritis     right fingers, both knees  . Gout of hand     Past Surgical History  Procedure Laterality Date  . Unremarkable      [name], NP as PCP - General (Nurse Practitioner)  Social History   Social History  . Marital Status: Single    Spouse Name: N/A  . Number of Children: N/A  . Years of Education: N/A   Occupational History  . Not on file.   Social History Main Topics  . Smoking status: Former Smoker    Quit date: 02/09/1982  . Smokeless tobacco: Never Used  . Alcohol Use: No  . Drug Use: No  . Sexual Activity: Not on file   Other Topics Concern  . Not on file   Social History Narrative     reports that he quit smoking about 33 years  ago. He has never used smokeless tobacco. He reports that he does not drink alcohol or use illicit drugs.  Family History  Problem Relation Age of Onset  . Stomach cancer Maternal Aunt 80  . Colon cancer Maternal Grandfather 10  . Heart attack Father    Family Status  Relation Status Death Age  . Maternal Grandfather Deceased   . Father Deceased   . Mother Alive   . Sister Alive   . Sister Alive   . Sister Alive   . Sister Alive      No Known Allergies  Medications: Patient's Medications  New Prescriptions   No medications on file  Previous Medications   ALLOPURINOL (ZYLOPRIM) 100 MG TABLET    To take 200 mg mid day- take in addition to 300 mg twice daily   ALLOPURINOL (ZYLOPRIM) 300 MG TABLET    TAKE 1 TABLET (300 MG TOTAL) BY MOUTH 2 (TWO) TIMES DAILY.   AMBULATORY NON FORMULARY MEDICATION    Lift Chair Dx:M19.90   ASPIRIN 81 MG EC TABLET    TAKE 1 TABLET (81 MG TOTAL) BY MOUTH DAILY.   COLCHICINE 0.6 MG TABLET    TAKE 1 TABLET (0.6 MG TOTAL) BY MOUTH 2 (TWO)  TIMES DAILY.   LISINOPRIL (PRINIVIL,ZESTRIL) 10 MG TABLET    Take 1 tablet (10 mg total) by mouth daily.   METFORMIN (GLUCOPHAGE) 500 MG TABLET    Take 1 tablet by mouth twice a day with food.   NYSTATIN-TRIAMCINOLONE OINTMENT (MYCOLOG)    APPLY 1 APPLICATION TOPICALLY 2 (TWO) TIMES DAILY.   SIMVASTATIN (ZOCOR) 10 MG TABLET    Take 1 tablet (10 mg total) by mouth at bedtime.  Modified Medications   No medications on file  Discontinued Medications   No medications on file    Review of Systems  Musculoskeletal: Positive for back pain, joint swelling, arthralgias and gait problem.  All other systems reviewed and are negative.   Filed Vitals:   12/05/15 1110  BP: 132/78  Pulse: 80  Temp: 98 F (36.7 C)  TempSrc: Oral  Height: 5\' 8"  (1.727 m)  Weight: 462 lb 9.6 oz (209.834 kg)  SpO2: 96%   Body mass index is 70.35 kg/(m^2).  Physical Exam  Constitutional: He is oriented to person, place, and time. He  appears well-developed and well-nourished.  HENT:  Mouth/Throat: Oropharynx is clear and moist.  Eyes: Pupils are equal, round, and reactive to light. No scleral icterus.  Neck: Neck supple. Carotid bruit is not present. No thyromegaly present.  Cardiovascular: Normal rate, regular rhythm, normal heart sounds and intact distal pulses.  Exam reveals no gallop and no friction rub.   No murmur heard. +2 pitting LE edema b/l. No calf TTP  Pulmonary/Chest: Effort normal and breath sounds normal. He has no wheezes. He has no rales. He exhibits no tenderness.  Abdominal: Soft. Bowel sounds are normal. He exhibits no distension, no abdominal bruit, no pulsatile midline mass and no mass. There is no tenderness. There is no rebound and no guarding.  Musculoskeletal: He exhibits edema and tenderness.  Lymphadenopathy:    He has no cervical adenopathy.  Neurological: He is alert and oriented to person, place, and time.  Skin: Skin is warm and dry. Rash (eczematous rash no blisters) noted.  Psychiatric: He has a normal mood and affect. His behavior is normal. Judgment and thought content normal.     Labs reviewed: No visits with results within 3 Month(s) from this visit. Latest known visit with results is:  Appointment on 08/18/2015  Component Date Value Ref Range Status  . Hgb A1c MFr Bld 08/18/2015 5.8* 4.8 - 5.6 % Final   Comment:          Pre-diabetes: 5.7 - 6.4          Diabetes: >6.4          Glycemic control for adults with diabetes: <7.0   . Est. average glucose Bld gHb Est-m* 08/18/2015 120   Final  . Glucose 08/18/2015 99  65 - 99 mg/dL Final  . BUN 08/18/2015 17  6 - 24 mg/dL Final  . Creatinine, Ser 08/18/2015 1.39* 0.76 - 1.27 mg/dL Final  . GFR calc non Af Amer 08/18/2015 57* >59 mL/min/1.73 Final  . GFR calc Af Amer 08/18/2015 65  >59 mL/min/1.73 Final  . BUN/Creatinine Ratio 08/18/2015 12  9 - 20 Final  . Sodium 08/18/2015 142  134 - 144 mmol/L Final  . Potassium 08/18/2015  4.5  3.5 - 5.2 mmol/L Final  . Chloride 08/18/2015 105  96 - 106 mmol/L Final  . CO2 08/18/2015 20  18 - 29 mmol/L Final  . Calcium 08/18/2015 8.6* 8.7 - 10.2 mg/dL Final    No results  found.   Assessment/Plan   ICD-9-CM ICD-10-CM   1. Primary osteoarthritis involving multiple joints 715.09 M15.0 Ambulatory referral to Home Health   uncontrolled pain  2. Morbid obesity due to excess calories ([place]) 278.01 E66.01 Ambulatory referral to Home Health  3. Essential hypertension 401.9 I10   4. Type 2 diabetes mellitus without complication, without long-term current use of insulin ([facility]) 250.00 E11.9 Hemoglobin A1c     Urinalysis with Reflex Microscopic     Microalbumin/Creatinine Ratio, Urine     Ambulatory referral to [facility]  5. Gout with tophus, unspecified cause, unspecified chronicity, unspecified site 274.82 [id number]   6. Hyperlipidemia 272.4 E78.5 Lipid Panel  7. Contact dermatitis 692.9 L25.9 nystatin-triamcinolone ointment (MYCOLOG)     methylPREDNISolone acetate (DEPO-MEDROL) injection 40 mg   Depo-medrol 40mg  injection given today  Continue current medications as ordered  Will call with lab results  Continue diet and exercise program  Follow up in 3 mos for routine visit with Sherrie Mustache, Emanuel Perlie Gold  [facility] [address], [place] [address] [phone] Cell (Monday-Friday 8 AM - 5 PM) [phone] After 5 PM and follow prompts

## 2015-12-06 LAB — LIPID PANEL
CHOLESTEROL TOTAL: 179 mg/dL (ref 100–199)
Chol/HDL Ratio: 4.4 ratio units (ref 0.0–5.0)
HDL: 41 mg/dL (ref >39)
LDL CALC: 122 mg/dL — AB (ref 0–99)
Triglycerides: 82 mg/dL (ref 0–149)
VLDL Cholesterol Cal: 16 mg/dL (ref 5–40)

## 2015-12-06 LAB — URINALYSIS, ROUTINE W REFLEX MICROSCOPIC
BILIRUBIN UA: NEGATIVE
GLUCOSE, UA: NEGATIVE
KETONES UA: NEGATIVE
LEUKOCYTES UA: NEGATIVE
Nitrite, UA: NEGATIVE
PROTEIN UA: NEGATIVE
RBC, UA: NEGATIVE
SPEC GRAV UA: 1.025 (ref 1.005–1.030)
UUROB: 1 mg/dL (ref 0.2–1.0)
pH, UA: 6 (ref 5.0–7.5)

## 2015-12-06 LAB — HEMOGLOBIN A1C
Est. average glucose Bld gHb Est-mCnc: 114 mg/dL
Hgb A1c MFr Bld: 5.6 % (ref 4.8–5.6)

## 2015-12-06 LAB — MICROALBUMIN / CREATININE URINE RATIO
CREATININE, UR: 230.5 mg/dL
MICROALB/CREAT RATIO: 1.7 mg/g creat (ref 0.0–30.0)
Microalbumin, Urine: 3.9 ug/mL

## 2015-12-12 DIAGNOSIS — E785 Hyperlipidemia, unspecified: Secondary | ICD-10-CM | POA: Diagnosis not present

## 2015-12-12 DIAGNOSIS — M150 Primary generalized (osteo)arthritis: Secondary | ICD-10-CM | POA: Diagnosis not present

## 2015-12-12 DIAGNOSIS — Z87891 Personal history of nicotine dependence: Secondary | ICD-10-CM | POA: Diagnosis not present

## 2015-12-12 DIAGNOSIS — I10 Essential (primary) hypertension: Secondary | ICD-10-CM | POA: Diagnosis not present

## 2015-12-12 DIAGNOSIS — E119 Type 2 diabetes mellitus without complications: Secondary | ICD-10-CM | POA: Diagnosis not present

## 2015-12-12 DIAGNOSIS — M1A9XX1 Chronic gout, unspecified, with tophus (tophi): Secondary | ICD-10-CM | POA: Diagnosis not present

## 2015-12-12 DIAGNOSIS — Z7984 Long term (current) use of oral hypoglycemic drugs: Secondary | ICD-10-CM | POA: Diagnosis not present

## 2015-12-17 DIAGNOSIS — Z7984 Long term (current) use of oral hypoglycemic drugs: Secondary | ICD-10-CM | POA: Diagnosis not present

## 2015-12-17 DIAGNOSIS — E119 Type 2 diabetes mellitus without complications: Secondary | ICD-10-CM | POA: Diagnosis not present

## 2015-12-17 DIAGNOSIS — E785 Hyperlipidemia, unspecified: Secondary | ICD-10-CM | POA: Diagnosis not present

## 2015-12-17 DIAGNOSIS — Z87891 Personal history of nicotine dependence: Secondary | ICD-10-CM | POA: Diagnosis not present

## 2015-12-17 DIAGNOSIS — M150 Primary generalized (osteo)arthritis: Secondary | ICD-10-CM | POA: Diagnosis not present

## 2015-12-17 DIAGNOSIS — I10 Essential (primary) hypertension: Secondary | ICD-10-CM | POA: Diagnosis not present

## 2015-12-17 DIAGNOSIS — M1A9XX1 Chronic gout, unspecified, with tophus (tophi): Secondary | ICD-10-CM | POA: Diagnosis not present

## 2015-12-18 DIAGNOSIS — E119 Type 2 diabetes mellitus without complications: Secondary | ICD-10-CM | POA: Diagnosis not present

## 2015-12-18 DIAGNOSIS — E785 Hyperlipidemia, unspecified: Secondary | ICD-10-CM | POA: Diagnosis not present

## 2015-12-18 DIAGNOSIS — I10 Essential (primary) hypertension: Secondary | ICD-10-CM | POA: Diagnosis not present

## 2015-12-18 DIAGNOSIS — M150 Primary generalized (osteo)arthritis: Secondary | ICD-10-CM | POA: Diagnosis not present

## 2015-12-18 DIAGNOSIS — M1A9XX1 Chronic gout, unspecified, with tophus (tophi): Secondary | ICD-10-CM | POA: Diagnosis not present

## 2015-12-18 DIAGNOSIS — Z7984 Long term (current) use of oral hypoglycemic drugs: Secondary | ICD-10-CM | POA: Diagnosis not present

## 2015-12-18 DIAGNOSIS — Z87891 Personal history of nicotine dependence: Secondary | ICD-10-CM | POA: Diagnosis not present

## 2015-12-19 DIAGNOSIS — E119 Type 2 diabetes mellitus without complications: Secondary | ICD-10-CM | POA: Diagnosis not present

## 2015-12-19 DIAGNOSIS — Z7984 Long term (current) use of oral hypoglycemic drugs: Secondary | ICD-10-CM | POA: Diagnosis not present

## 2015-12-19 DIAGNOSIS — E785 Hyperlipidemia, unspecified: Secondary | ICD-10-CM | POA: Diagnosis not present

## 2015-12-19 DIAGNOSIS — M150 Primary generalized (osteo)arthritis: Secondary | ICD-10-CM | POA: Diagnosis not present

## 2015-12-19 DIAGNOSIS — I10 Essential (primary) hypertension: Secondary | ICD-10-CM | POA: Diagnosis not present

## 2015-12-19 DIAGNOSIS — Z87891 Personal history of nicotine dependence: Secondary | ICD-10-CM | POA: Diagnosis not present

## 2015-12-19 DIAGNOSIS — M1A9XX1 Chronic gout, unspecified, with tophus (tophi): Secondary | ICD-10-CM | POA: Diagnosis not present

## 2015-12-22 DIAGNOSIS — Z87891 Personal history of nicotine dependence: Secondary | ICD-10-CM | POA: Diagnosis not present

## 2015-12-22 DIAGNOSIS — M1A9XX1 Chronic gout, unspecified, with tophus (tophi): Secondary | ICD-10-CM | POA: Diagnosis not present

## 2015-12-22 DIAGNOSIS — Z7984 Long term (current) use of oral hypoglycemic drugs: Secondary | ICD-10-CM | POA: Diagnosis not present

## 2015-12-22 DIAGNOSIS — E119 Type 2 diabetes mellitus without complications: Secondary | ICD-10-CM | POA: Diagnosis not present

## 2015-12-22 DIAGNOSIS — I10 Essential (primary) hypertension: Secondary | ICD-10-CM | POA: Diagnosis not present

## 2015-12-22 DIAGNOSIS — M150 Primary generalized (osteo)arthritis: Secondary | ICD-10-CM | POA: Diagnosis not present

## 2015-12-22 DIAGNOSIS — E785 Hyperlipidemia, unspecified: Secondary | ICD-10-CM | POA: Diagnosis not present

## 2015-12-24 DIAGNOSIS — E785 Hyperlipidemia, unspecified: Secondary | ICD-10-CM | POA: Diagnosis not present

## 2015-12-24 DIAGNOSIS — E119 Type 2 diabetes mellitus without complications: Secondary | ICD-10-CM | POA: Diagnosis not present

## 2015-12-24 DIAGNOSIS — M150 Primary generalized (osteo)arthritis: Secondary | ICD-10-CM | POA: Diagnosis not present

## 2015-12-24 DIAGNOSIS — Z87891 Personal history of nicotine dependence: Secondary | ICD-10-CM | POA: Diagnosis not present

## 2015-12-24 DIAGNOSIS — I10 Essential (primary) hypertension: Secondary | ICD-10-CM | POA: Diagnosis not present

## 2015-12-24 DIAGNOSIS — Z7984 Long term (current) use of oral hypoglycemic drugs: Secondary | ICD-10-CM | POA: Diagnosis not present

## 2015-12-24 DIAGNOSIS — M1A9XX1 Chronic gout, unspecified, with tophus (tophi): Secondary | ICD-10-CM | POA: Diagnosis not present

## 2015-12-25 DIAGNOSIS — I10 Essential (primary) hypertension: Secondary | ICD-10-CM | POA: Diagnosis not present

## 2015-12-25 DIAGNOSIS — M1A9XX1 Chronic gout, unspecified, with tophus (tophi): Secondary | ICD-10-CM | POA: Diagnosis not present

## 2015-12-25 DIAGNOSIS — Z87891 Personal history of nicotine dependence: Secondary | ICD-10-CM | POA: Diagnosis not present

## 2015-12-25 DIAGNOSIS — E119 Type 2 diabetes mellitus without complications: Secondary | ICD-10-CM | POA: Diagnosis not present

## 2015-12-25 DIAGNOSIS — M150 Primary generalized (osteo)arthritis: Secondary | ICD-10-CM | POA: Diagnosis not present

## 2015-12-25 DIAGNOSIS — E785 Hyperlipidemia, unspecified: Secondary | ICD-10-CM | POA: Diagnosis not present

## 2015-12-25 DIAGNOSIS — Z7984 Long term (current) use of oral hypoglycemic drugs: Secondary | ICD-10-CM | POA: Diagnosis not present

## 2015-12-26 ENCOUNTER — Telehealth: Payer: Self-pay

## 2015-12-26 DIAGNOSIS — M1A9XX1 Chronic gout, unspecified, with tophus (tophi): Secondary | ICD-10-CM | POA: Diagnosis not present

## 2015-12-26 DIAGNOSIS — E785 Hyperlipidemia, unspecified: Secondary | ICD-10-CM | POA: Diagnosis not present

## 2015-12-26 DIAGNOSIS — E119 Type 2 diabetes mellitus without complications: Secondary | ICD-10-CM | POA: Diagnosis not present

## 2015-12-26 DIAGNOSIS — Z7984 Long term (current) use of oral hypoglycemic drugs: Secondary | ICD-10-CM | POA: Diagnosis not present

## 2015-12-26 DIAGNOSIS — Z87891 Personal history of nicotine dependence: Secondary | ICD-10-CM | POA: Diagnosis not present

## 2015-12-26 DIAGNOSIS — I10 Essential (primary) hypertension: Secondary | ICD-10-CM | POA: Diagnosis not present

## 2015-12-26 DIAGNOSIS — M150 Primary generalized (osteo)arthritis: Secondary | ICD-10-CM | POA: Diagnosis not present

## 2015-12-26 NOTE — Telephone Encounter (Signed)
Tiffany left message on triage voicemail yesterday at 4:25 pm requesting Insulin, glucometer, and test strips to be sent to patient's pharmacy (request made on behalf of the patient)   Last A1c 5.6 on 12/05/15 Last OV 12/05/15 Pending OV 03/01/16   Please advise on request, if authorized please provide all details for glucometer (how many times patient to check BS, when, and what vaule warrants a phone call to provider)

## 2015-12-29 NOTE — Telephone Encounter (Signed)
Okay to approve, pt to take blood sugar twice weekly

## 2015-12-30 DIAGNOSIS — M1A9XX1 Chronic gout, unspecified, with tophus (tophi): Secondary | ICD-10-CM | POA: Diagnosis not present

## 2015-12-30 DIAGNOSIS — I10 Essential (primary) hypertension: Secondary | ICD-10-CM | POA: Diagnosis not present

## 2015-12-30 DIAGNOSIS — E119 Type 2 diabetes mellitus without complications: Secondary | ICD-10-CM | POA: Diagnosis not present

## 2015-12-30 DIAGNOSIS — Z7984 Long term (current) use of oral hypoglycemic drugs: Secondary | ICD-10-CM | POA: Diagnosis not present

## 2015-12-30 DIAGNOSIS — Z87891 Personal history of nicotine dependence: Secondary | ICD-10-CM | POA: Diagnosis not present

## 2015-12-30 DIAGNOSIS — M150 Primary generalized (osteo)arthritis: Secondary | ICD-10-CM | POA: Diagnosis not present

## 2015-12-30 DIAGNOSIS — E785 Hyperlipidemia, unspecified: Secondary | ICD-10-CM | POA: Diagnosis not present

## 2015-12-30 NOTE — Telephone Encounter (Signed)
Please advise on the request for insulin

## 2015-12-30 NOTE — Telephone Encounter (Signed)
Pt does not need insulin at this time

## 2015-12-31 DIAGNOSIS — R69 Illness, unspecified: Secondary | ICD-10-CM | POA: Diagnosis not present

## 2015-12-31 MED ORDER — GLUCOSE BLOOD VI STRP: ORAL_STRIP | Status: DC

## 2015-12-31 MED ORDER — ONETOUCH DELICA LANCETS 33G MISC: Status: DC

## 2015-12-31 MED ORDER — ONETOUCH BASIC SYSTEM W/DEVICE KIT: PACK | Status: DC

## 2015-12-31 NOTE — Telephone Encounter (Signed)
Rx sent to pharmacy for meter, test strips and lancets

## 2016-01-02 DIAGNOSIS — E785 Hyperlipidemia, unspecified: Secondary | ICD-10-CM | POA: Diagnosis not present

## 2016-01-02 DIAGNOSIS — E119 Type 2 diabetes mellitus without complications: Secondary | ICD-10-CM | POA: Diagnosis not present

## 2016-01-02 DIAGNOSIS — Z7984 Long term (current) use of oral hypoglycemic drugs: Secondary | ICD-10-CM | POA: Diagnosis not present

## 2016-01-02 DIAGNOSIS — M1A9XX1 Chronic gout, unspecified, with tophus (tophi): Secondary | ICD-10-CM | POA: Diagnosis not present

## 2016-01-02 DIAGNOSIS — Z87891 Personal history of nicotine dependence: Secondary | ICD-10-CM | POA: Diagnosis not present

## 2016-01-02 DIAGNOSIS — M150 Primary generalized (osteo)arthritis: Secondary | ICD-10-CM | POA: Diagnosis not present

## 2016-01-02 DIAGNOSIS — I10 Essential (primary) hypertension: Secondary | ICD-10-CM | POA: Diagnosis not present

## 2016-01-03 DIAGNOSIS — E785 Hyperlipidemia, unspecified: Secondary | ICD-10-CM | POA: Diagnosis not present

## 2016-01-03 DIAGNOSIS — M150 Primary generalized (osteo)arthritis: Secondary | ICD-10-CM | POA: Diagnosis not present

## 2016-01-03 DIAGNOSIS — E119 Type 2 diabetes mellitus without complications: Secondary | ICD-10-CM | POA: Diagnosis not present

## 2016-01-03 DIAGNOSIS — Z87891 Personal history of nicotine dependence: Secondary | ICD-10-CM | POA: Diagnosis not present

## 2016-01-03 DIAGNOSIS — Z7984 Long term (current) use of oral hypoglycemic drugs: Secondary | ICD-10-CM | POA: Diagnosis not present

## 2016-01-03 DIAGNOSIS — I10 Essential (primary) hypertension: Secondary | ICD-10-CM | POA: Diagnosis not present

## 2016-01-03 DIAGNOSIS — M1A9XX1 Chronic gout, unspecified, with tophus (tophi): Secondary | ICD-10-CM | POA: Diagnosis not present

## 2016-01-05 DIAGNOSIS — Z87891 Personal history of nicotine dependence: Secondary | ICD-10-CM | POA: Diagnosis not present

## 2016-01-05 DIAGNOSIS — E119 Type 2 diabetes mellitus without complications: Secondary | ICD-10-CM | POA: Diagnosis not present

## 2016-01-05 DIAGNOSIS — E785 Hyperlipidemia, unspecified: Secondary | ICD-10-CM | POA: Diagnosis not present

## 2016-01-05 DIAGNOSIS — Z7984 Long term (current) use of oral hypoglycemic drugs: Secondary | ICD-10-CM | POA: Diagnosis not present

## 2016-01-05 DIAGNOSIS — M150 Primary generalized (osteo)arthritis: Secondary | ICD-10-CM | POA: Diagnosis not present

## 2016-01-05 DIAGNOSIS — M1A9XX1 Chronic gout, unspecified, with tophus (tophi): Secondary | ICD-10-CM | POA: Diagnosis not present

## 2016-01-05 DIAGNOSIS — I10 Essential (primary) hypertension: Secondary | ICD-10-CM | POA: Diagnosis not present

## 2016-01-07 DIAGNOSIS — E785 Hyperlipidemia, unspecified: Secondary | ICD-10-CM | POA: Diagnosis not present

## 2016-01-07 DIAGNOSIS — E119 Type 2 diabetes mellitus without complications: Secondary | ICD-10-CM | POA: Diagnosis not present

## 2016-01-07 DIAGNOSIS — Z87891 Personal history of nicotine dependence: Secondary | ICD-10-CM | POA: Diagnosis not present

## 2016-01-07 DIAGNOSIS — M1A9XX1 Chronic gout, unspecified, with tophus (tophi): Secondary | ICD-10-CM | POA: Diagnosis not present

## 2016-01-07 DIAGNOSIS — I10 Essential (primary) hypertension: Secondary | ICD-10-CM | POA: Diagnosis not present

## 2016-01-07 DIAGNOSIS — M150 Primary generalized (osteo)arthritis: Secondary | ICD-10-CM | POA: Diagnosis not present

## 2016-01-07 DIAGNOSIS — Z7984 Long term (current) use of oral hypoglycemic drugs: Secondary | ICD-10-CM | POA: Diagnosis not present

## 2016-01-08 DIAGNOSIS — Z7984 Long term (current) use of oral hypoglycemic drugs: Secondary | ICD-10-CM | POA: Diagnosis not present

## 2016-01-08 DIAGNOSIS — E119 Type 2 diabetes mellitus without complications: Secondary | ICD-10-CM | POA: Diagnosis not present

## 2016-01-08 DIAGNOSIS — I10 Essential (primary) hypertension: Secondary | ICD-10-CM | POA: Diagnosis not present

## 2016-01-08 DIAGNOSIS — M150 Primary generalized (osteo)arthritis: Secondary | ICD-10-CM | POA: Diagnosis not present

## 2016-01-08 DIAGNOSIS — E785 Hyperlipidemia, unspecified: Secondary | ICD-10-CM | POA: Diagnosis not present

## 2016-01-08 DIAGNOSIS — M1A9XX1 Chronic gout, unspecified, with tophus (tophi): Secondary | ICD-10-CM | POA: Diagnosis not present

## 2016-01-08 DIAGNOSIS — Z87891 Personal history of nicotine dependence: Secondary | ICD-10-CM | POA: Diagnosis not present

## 2016-01-09 DIAGNOSIS — Z87891 Personal history of nicotine dependence: Secondary | ICD-10-CM | POA: Diagnosis not present

## 2016-01-09 DIAGNOSIS — M150 Primary generalized (osteo)arthritis: Secondary | ICD-10-CM | POA: Diagnosis not present

## 2016-01-09 DIAGNOSIS — E785 Hyperlipidemia, unspecified: Secondary | ICD-10-CM | POA: Diagnosis not present

## 2016-01-09 DIAGNOSIS — M1A9XX1 Chronic gout, unspecified, with tophus (tophi): Secondary | ICD-10-CM | POA: Diagnosis not present

## 2016-01-09 DIAGNOSIS — Z7984 Long term (current) use of oral hypoglycemic drugs: Secondary | ICD-10-CM | POA: Diagnosis not present

## 2016-01-09 DIAGNOSIS — I10 Essential (primary) hypertension: Secondary | ICD-10-CM | POA: Diagnosis not present

## 2016-01-09 DIAGNOSIS — E119 Type 2 diabetes mellitus without complications: Secondary | ICD-10-CM | POA: Diagnosis not present

## 2016-01-12 DIAGNOSIS — I10 Essential (primary) hypertension: Secondary | ICD-10-CM | POA: Diagnosis not present

## 2016-01-12 DIAGNOSIS — M1A9XX1 Chronic gout, unspecified, with tophus (tophi): Secondary | ICD-10-CM | POA: Diagnosis not present

## 2016-01-12 DIAGNOSIS — Z87891 Personal history of nicotine dependence: Secondary | ICD-10-CM | POA: Diagnosis not present

## 2016-01-12 DIAGNOSIS — E119 Type 2 diabetes mellitus without complications: Secondary | ICD-10-CM | POA: Diagnosis not present

## 2016-01-12 DIAGNOSIS — E785 Hyperlipidemia, unspecified: Secondary | ICD-10-CM | POA: Diagnosis not present

## 2016-01-12 DIAGNOSIS — Z7984 Long term (current) use of oral hypoglycemic drugs: Secondary | ICD-10-CM | POA: Diagnosis not present

## 2016-01-12 DIAGNOSIS — M150 Primary generalized (osteo)arthritis: Secondary | ICD-10-CM | POA: Diagnosis not present

## 2016-01-13 DIAGNOSIS — I10 Essential (primary) hypertension: Secondary | ICD-10-CM | POA: Diagnosis not present

## 2016-01-13 DIAGNOSIS — M1A9XX1 Chronic gout, unspecified, with tophus (tophi): Secondary | ICD-10-CM | POA: Diagnosis not present

## 2016-01-13 DIAGNOSIS — Z87891 Personal history of nicotine dependence: Secondary | ICD-10-CM | POA: Diagnosis not present

## 2016-01-13 DIAGNOSIS — M150 Primary generalized (osteo)arthritis: Secondary | ICD-10-CM | POA: Diagnosis not present

## 2016-01-13 DIAGNOSIS — Z7984 Long term (current) use of oral hypoglycemic drugs: Secondary | ICD-10-CM | POA: Diagnosis not present

## 2016-01-13 DIAGNOSIS — E119 Type 2 diabetes mellitus without complications: Secondary | ICD-10-CM | POA: Diagnosis not present

## 2016-01-13 DIAGNOSIS — E785 Hyperlipidemia, unspecified: Secondary | ICD-10-CM | POA: Diagnosis not present

## 2016-01-14 DIAGNOSIS — E785 Hyperlipidemia, unspecified: Secondary | ICD-10-CM | POA: Diagnosis not present

## 2016-01-14 DIAGNOSIS — I10 Essential (primary) hypertension: Secondary | ICD-10-CM | POA: Diagnosis not present

## 2016-01-14 DIAGNOSIS — M1A9XX1 Chronic gout, unspecified, with tophus (tophi): Secondary | ICD-10-CM | POA: Diagnosis not present

## 2016-01-14 DIAGNOSIS — E119 Type 2 diabetes mellitus without complications: Secondary | ICD-10-CM | POA: Diagnosis not present

## 2016-01-14 DIAGNOSIS — M150 Primary generalized (osteo)arthritis: Secondary | ICD-10-CM | POA: Diagnosis not present

## 2016-01-14 DIAGNOSIS — Z7984 Long term (current) use of oral hypoglycemic drugs: Secondary | ICD-10-CM | POA: Diagnosis not present

## 2016-01-14 DIAGNOSIS — Z87891 Personal history of nicotine dependence: Secondary | ICD-10-CM | POA: Diagnosis not present

## 2016-01-16 DIAGNOSIS — E785 Hyperlipidemia, unspecified: Secondary | ICD-10-CM | POA: Diagnosis not present

## 2016-01-16 DIAGNOSIS — M150 Primary generalized (osteo)arthritis: Secondary | ICD-10-CM | POA: Diagnosis not present

## 2016-01-16 DIAGNOSIS — Z7984 Long term (current) use of oral hypoglycemic drugs: Secondary | ICD-10-CM | POA: Diagnosis not present

## 2016-01-16 DIAGNOSIS — Z87891 Personal history of nicotine dependence: Secondary | ICD-10-CM | POA: Diagnosis not present

## 2016-01-16 DIAGNOSIS — I10 Essential (primary) hypertension: Secondary | ICD-10-CM | POA: Diagnosis not present

## 2016-01-16 DIAGNOSIS — M1A9XX1 Chronic gout, unspecified, with tophus (tophi): Secondary | ICD-10-CM | POA: Diagnosis not present

## 2016-01-16 DIAGNOSIS — E119 Type 2 diabetes mellitus without complications: Secondary | ICD-10-CM | POA: Diagnosis not present

## 2016-01-19 DIAGNOSIS — E119 Type 2 diabetes mellitus without complications: Secondary | ICD-10-CM | POA: Diagnosis not present

## 2016-01-19 DIAGNOSIS — I10 Essential (primary) hypertension: Secondary | ICD-10-CM | POA: Diagnosis not present

## 2016-01-19 DIAGNOSIS — E785 Hyperlipidemia, unspecified: Secondary | ICD-10-CM | POA: Diagnosis not present

## 2016-01-19 DIAGNOSIS — Z7984 Long term (current) use of oral hypoglycemic drugs: Secondary | ICD-10-CM | POA: Diagnosis not present

## 2016-01-19 DIAGNOSIS — Z87891 Personal history of nicotine dependence: Secondary | ICD-10-CM | POA: Diagnosis not present

## 2016-01-19 DIAGNOSIS — M150 Primary generalized (osteo)arthritis: Secondary | ICD-10-CM | POA: Diagnosis not present

## 2016-01-19 DIAGNOSIS — M1A9XX1 Chronic gout, unspecified, with tophus (tophi): Secondary | ICD-10-CM | POA: Diagnosis not present

## 2016-01-26 ENCOUNTER — Emergency Department (HOSPITAL_COMMUNITY): Payer: Medicare HMO

## 2016-01-26 ENCOUNTER — Inpatient Hospital Stay (HOSPITAL_COMMUNITY)
Admission: EM | Admit: 2016-01-26 | Discharge: 2016-02-04 | DRG: 872 | Disposition: A | Discharge status: Skilled Nursing Facility | Payer: Medicare HMO | Attending: Family Medicine | Admitting: Family Medicine

## 2016-01-26 ENCOUNTER — Encounter (HOSPITAL_COMMUNITY): Payer: Self-pay

## 2016-01-26 DIAGNOSIS — M109 Gout, unspecified: Secondary | ICD-10-CM | POA: Diagnosis present

## 2016-01-26 DIAGNOSIS — R509 Fever, unspecified: Secondary | ICD-10-CM | POA: Diagnosis not present

## 2016-01-26 DIAGNOSIS — M79605 Pain in left leg: Secondary | ICD-10-CM | POA: Diagnosis not present

## 2016-01-26 DIAGNOSIS — L0390 Cellulitis, unspecified: Secondary | ICD-10-CM

## 2016-01-26 DIAGNOSIS — A4152 Sepsis due to Pseudomonas: Secondary | ICD-10-CM | POA: Diagnosis not present

## 2016-01-26 DIAGNOSIS — I10 Essential (primary) hypertension: Secondary | ICD-10-CM | POA: Diagnosis present

## 2016-01-26 DIAGNOSIS — D6489 Other specified anemias: Secondary | ICD-10-CM | POA: Diagnosis not present

## 2016-01-26 DIAGNOSIS — Z87891 Personal history of nicotine dependence: Secondary | ICD-10-CM

## 2016-01-26 DIAGNOSIS — I509 Heart failure, unspecified: Secondary | ICD-10-CM | POA: Diagnosis not present

## 2016-01-26 DIAGNOSIS — Z7982 Long term (current) use of aspirin: Secondary | ICD-10-CM

## 2016-01-26 DIAGNOSIS — M069 Rheumatoid arthritis, unspecified: Secondary | ICD-10-CM | POA: Diagnosis present

## 2016-01-26 DIAGNOSIS — L03116 Cellulitis of left lower limb: Secondary | ICD-10-CM | POA: Diagnosis not present

## 2016-01-26 DIAGNOSIS — E119 Type 2 diabetes mellitus without complications: Secondary | ICD-10-CM

## 2016-01-26 DIAGNOSIS — N179 Acute kidney failure, unspecified: Secondary | ICD-10-CM | POA: Diagnosis present

## 2016-01-26 DIAGNOSIS — Z7984 Long term (current) use of oral hypoglycemic drugs: Secondary | ICD-10-CM

## 2016-01-26 DIAGNOSIS — R7881 Bacteremia: Secondary | ICD-10-CM | POA: Diagnosis not present

## 2016-01-26 DIAGNOSIS — E785 Hyperlipidemia, unspecified: Secondary | ICD-10-CM | POA: Diagnosis present

## 2016-01-26 DIAGNOSIS — Z794 Long term (current) use of insulin: Secondary | ICD-10-CM | POA: Diagnosis not present

## 2016-01-26 DIAGNOSIS — Z6841 Body Mass Index (BMI) 40.0 and over, adult: Secondary | ICD-10-CM

## 2016-01-26 LAB — I-STAT CHEM 8, ED
BUN: 18 mg/dL (ref 6–20)
Calcium, Ion: 1.09 mmol/L — ABNORMAL LOW (ref 1.13–1.30)
Chloride: 99 mmol/L — ABNORMAL LOW (ref 101–111)
Creatinine, Ser: 1.2 mg/dL (ref 0.61–1.24)
Glucose, Bld: 92 mg/dL (ref 65–99)
HEMATOCRIT: 51 % (ref 39.0–52.0)
HEMOGLOBIN: 17.3 g/dL — AB (ref 13.0–17.0)
Potassium: 4.4 mmol/L (ref 3.5–5.1)
SODIUM: 138 mmol/L (ref 135–145)
TCO2: 27 mmol/L (ref 0–100)

## 2016-01-26 LAB — CBC WITH DIFFERENTIAL/PLATELET
BASOS ABS: 0 10*3/uL (ref 0.0–0.1)
BASOS PCT: 0 %
Eosinophils Absolute: 0 10*3/uL (ref 0.0–0.7)
Eosinophils Relative: 0 %
HEMATOCRIT: 47.5 % (ref 39.0–52.0)
HEMOGLOBIN: 16 g/dL (ref 13.0–17.0)
Lymphocytes Relative: 5 %
Lymphs Abs: 1.2 10*3/uL (ref 0.7–4.0)
MCH: 32.3 pg (ref 26.0–34.0)
MCHC: 33.7 g/dL (ref 30.0–36.0)
MCV: 95.8 fL (ref 78.0–100.0)
Monocytes Absolute: 1.1 10*3/uL — ABNORMAL HIGH (ref 0.1–1.0)
Monocytes Relative: 5 %
NEUTROS ABS: 19.5 10*3/uL — AB (ref 1.7–7.7)
NEUTROS PCT: 90 %
Platelets: 225 10*3/uL (ref 150–400)
RBC: 4.96 MIL/uL (ref 4.22–5.81)
RDW: 13.5 % (ref 11.5–15.5)
WBC: 21.9 10*3/uL — ABNORMAL HIGH (ref 4.0–10.5)

## 2016-01-26 LAB — URINALYSIS, ROUTINE W REFLEX MICROSCOPIC
Bilirubin Urine: NEGATIVE
GLUCOSE, UA: NEGATIVE mg/dL
Hgb urine dipstick: NEGATIVE
Ketones, ur: NEGATIVE mg/dL
LEUKOCYTES UA: NEGATIVE
Nitrite: NEGATIVE
PROTEIN: NEGATIVE mg/dL
Specific Gravity, Urine: 1.011 (ref 1.005–1.030)
pH: 5 (ref 5.0–8.0)

## 2016-01-26 LAB — I-STAT CG4 LACTIC ACID, ED: LACTIC ACID, VENOUS: 2.32 mmol/L — AB (ref 0.5–1.9)

## 2016-01-26 MED ORDER — LEVOFLOXACIN IN D5W 750 MG/150ML IV SOLN
750.0000 mg | Freq: Once | INTRAVENOUS | Status: AC
  Administered 2016-01-27: 750 mg via INTRAVENOUS
  Filled 2016-01-26: qty 150

## 2016-01-26 MED ORDER — CLINDAMYCIN PHOSPHATE 900 MG/50ML IV SOLN
900.0000 mg | Freq: Once | INTRAVENOUS | Status: AC
  Administered 2016-01-27: 900 mg via INTRAVENOUS
  Filled 2016-01-26: qty 50
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
## 2016-01-27 ENCOUNTER — Encounter (HOSPITAL_COMMUNITY): Payer: Self-pay | Admitting: Internal Medicine

## 2016-01-27 ENCOUNTER — Observation Stay (HOSPITAL_COMMUNITY): Payer: Medicare HMO

## 2016-01-27 DIAGNOSIS — I509 Heart failure, unspecified: Secondary | ICD-10-CM | POA: Diagnosis not present

## 2016-01-27 DIAGNOSIS — R278 Other lack of coordination: Secondary | ICD-10-CM | POA: Diagnosis not present

## 2016-01-27 DIAGNOSIS — B999 Unspecified infectious disease: Secondary | ICD-10-CM | POA: Diagnosis not present

## 2016-01-27 DIAGNOSIS — R609 Edema, unspecified: Secondary | ICD-10-CM | POA: Diagnosis not present

## 2016-01-27 DIAGNOSIS — Z6841 Body Mass Index (BMI) 40.0 and over, adult: Secondary | ICD-10-CM | POA: Diagnosis not present

## 2016-01-27 DIAGNOSIS — Z794 Long term (current) use of insulin: Secondary | ICD-10-CM | POA: Diagnosis not present

## 2016-01-27 DIAGNOSIS — M6281 Muscle weakness (generalized): Secondary | ICD-10-CM | POA: Diagnosis not present

## 2016-01-27 DIAGNOSIS — D6489 Other specified anemias: Secondary | ICD-10-CM | POA: Diagnosis not present

## 2016-01-27 DIAGNOSIS — L03119 Cellulitis of unspecified part of limb: Secondary | ICD-10-CM | POA: Diagnosis not present

## 2016-01-27 DIAGNOSIS — M069 Rheumatoid arthritis, unspecified: Secondary | ICD-10-CM | POA: Diagnosis not present

## 2016-01-27 DIAGNOSIS — M109 Gout, unspecified: Secondary | ICD-10-CM | POA: Diagnosis present

## 2016-01-27 DIAGNOSIS — E785 Hyperlipidemia, unspecified: Secondary | ICD-10-CM | POA: Diagnosis not present

## 2016-01-27 DIAGNOSIS — Z87891 Personal history of nicotine dependence: Secondary | ICD-10-CM | POA: Diagnosis not present

## 2016-01-27 DIAGNOSIS — N179 Acute kidney failure, unspecified: Secondary | ICD-10-CM | POA: Diagnosis not present

## 2016-01-27 DIAGNOSIS — L03116 Cellulitis of left lower limb: Secondary | ICD-10-CM | POA: Diagnosis present

## 2016-01-27 DIAGNOSIS — I10 Essential (primary) hypertension: Secondary | ICD-10-CM | POA: Diagnosis not present

## 2016-01-27 DIAGNOSIS — R7881 Bacteremia: Secondary | ICD-10-CM | POA: Diagnosis not present

## 2016-01-27 DIAGNOSIS — M79605 Pain in left leg: Secondary | ICD-10-CM | POA: Diagnosis present

## 2016-01-27 DIAGNOSIS — A419 Sepsis, unspecified organism: Secondary | ICD-10-CM | POA: Diagnosis not present

## 2016-01-27 DIAGNOSIS — R262 Difficulty in walking, not elsewhere classified: Secondary | ICD-10-CM | POA: Diagnosis not present

## 2016-01-27 DIAGNOSIS — E119 Type 2 diabetes mellitus without complications: Secondary | ICD-10-CM | POA: Diagnosis not present

## 2016-01-27 DIAGNOSIS — R600 Localized edema: Secondary | ICD-10-CM | POA: Diagnosis not present

## 2016-01-27 DIAGNOSIS — Z7984 Long term (current) use of oral hypoglycemic drugs: Secondary | ICD-10-CM | POA: Diagnosis not present

## 2016-01-27 DIAGNOSIS — M1029 Drug-induced gout, multiple sites: Secondary | ICD-10-CM | POA: Diagnosis not present

## 2016-01-27 DIAGNOSIS — A4152 Sepsis due to Pseudomonas: Secondary | ICD-10-CM | POA: Diagnosis not present

## 2016-01-27 DIAGNOSIS — Z7982 Long term (current) use of aspirin: Secondary | ICD-10-CM | POA: Diagnosis not present

## 2016-01-27 LAB — COMPREHENSIVE METABOLIC PANEL
ALK PHOS: 104 U/L (ref 38–126)
ALK PHOS: 86 U/L (ref 38–126)
ALT: 37 U/L (ref 17–63)
ALT: 29 U/L (ref 17–63)
ANION GAP: 9 (ref 5–15)
ANION GAP: 8 (ref 5–15)
AST: 34 U/L (ref 15–41)
AST: 26 U/L (ref 15–41)
Albumin: 3.7 g/dL (ref 3.5–5.0)
Albumin: 2.9 g/dL — ABNORMAL LOW (ref 3.5–5.0)
BILIRUBIN TOTAL: 0.5 mg/dL (ref 0.3–1.2)
BUN: 14 mg/dL (ref 6–20)
BUN: 14 mg/dL (ref 6–20)
CALCIUM: 9.5 mg/dL (ref 8.9–10.3)
CALCIUM: 8.7 mg/dL — AB (ref 8.9–10.3)
CO2: 28 mmol/L (ref 22–32)
CO2: 27 mmol/L (ref 22–32)
CREATININE: 1.29 mg/dL — AB (ref 0.61–1.24)
CREATININE: 1.26 mg/dL — AB (ref 0.61–1.24)
Chloride: 99 mmol/L — ABNORMAL LOW (ref 101–111)
Chloride: 101 mmol/L (ref 101–111)
Glucose, Bld: 94 mg/dL (ref 65–99)
Glucose, Bld: 108 mg/dL — ABNORMAL HIGH (ref 65–99)
Potassium: 4.4 mmol/L (ref 3.5–5.1)
Potassium: 4 mmol/L (ref 3.5–5.1)
Sodium: 136 mmol/L (ref 135–145)
Sodium: 136 mmol/L (ref 135–145)
TOTAL PROTEIN: 7.9 g/dL (ref 6.5–8.1)
TOTAL PROTEIN: 6.5 g/dL (ref 6.5–8.1)
Total Bilirubin: 0.8 mg/dL (ref 0.3–1.2)

## 2016-01-27 LAB — CBC WITH DIFFERENTIAL/PLATELET
Basophils Absolute: 0 10*3/uL (ref 0.0–0.1)
Basophils Relative: 0 %
EOS ABS: 0.1 10*3/uL (ref 0.0–0.7)
Eosinophils Relative: 0 %
HEMATOCRIT: 41 % (ref 39.0–52.0)
HEMOGLOBIN: 13.7 g/dL (ref 13.0–17.0)
LYMPHS ABS: 1.5 10*3/uL (ref 0.7–4.0)
LYMPHS PCT: 7 %
MCH: 31.6 pg (ref 26.0–34.0)
MCHC: 33.4 g/dL (ref 30.0–36.0)
MCV: 94.7 fL (ref 78.0–100.0)
MONOS PCT: 5 %
Monocytes Absolute: 1.1 10*3/uL — ABNORMAL HIGH (ref 0.1–1.0)
NEUTROS ABS: 19.3 10*3/uL — AB (ref 1.7–7.7)
NEUTROS PCT: 88 %
Platelets: 197 10*3/uL (ref 150–400)
RBC: 4.33 MIL/uL (ref 4.22–5.81)
RDW: 13.3 % (ref 11.5–15.5)
WBC: 22 10*3/uL — AB (ref 4.0–10.5)

## 2016-01-27 LAB — BLOOD CULTURE ID PANEL (REFLEXED)
Acinetobacter baumannii: NOT DETECTED
CANDIDA ALBICANS: NOT DETECTED
CANDIDA KRUSEI: NOT DETECTED
CANDIDA PARAPSILOSIS: NOT DETECTED
CANDIDA TROPICALIS: NOT DETECTED
CARBAPENEM RESISTANCE: NOT DETECTED
Candida glabrata: NOT DETECTED
ENTEROBACTER CLOACAE COMPLEX: NOT DETECTED
ENTEROCOCCUS SPECIES: NOT DETECTED
Enterobacteriaceae species: NOT DETECTED
Escherichia coli: NOT DETECTED
Haemophilus influenzae: NOT DETECTED
KLEBSIELLA PNEUMONIAE: NOT DETECTED
Klebsiella oxytoca: NOT DETECTED
Listeria monocytogenes: NOT DETECTED
Methicillin resistance: NOT DETECTED
Neisseria meningitidis: NOT DETECTED
PROTEUS SPECIES: NOT DETECTED
Pseudomonas aeruginosa: NOT DETECTED
STAPHYLOCOCCUS AUREUS BCID: NOT DETECTED
STREPTOCOCCUS PNEUMONIAE: NOT DETECTED
Serratia marcescens: NOT DETECTED
Staphylococcus species: NOT DETECTED
Streptococcus agalactiae: NOT DETECTED
Streptococcus pyogenes: NOT DETECTED
Streptococcus species: NOT DETECTED
VANCOMYCIN RESISTANCE: NOT DETECTED

## 2016-01-27 LAB — GLUCOSE, CAPILLARY
GLUCOSE-CAPILLARY: 120 mg/dL — AB (ref 65–99)
Glucose-Capillary: 106 mg/dL — ABNORMAL HIGH (ref 65–99)
Glucose-Capillary: 130 mg/dL — ABNORMAL HIGH (ref 65–99)
Glucose-Capillary: 145 mg/dL — ABNORMAL HIGH (ref 65–99)

## 2016-01-27 LAB — LACTIC ACID, PLASMA
Lactic Acid, Venous: 1.5 mmol/L (ref 0.5–1.9)
Lactic Acid, Venous: 1.8 mmol/L (ref 0.5–1.9)

## 2016-01-27 LAB — HIV ANTIBODY (ROUTINE TESTING W REFLEX): HIV SCREEN 4TH GENERATION: NONREACTIVE

## 2016-01-27 LAB — PROCALCITONIN: PROCALCITONIN: 7.28 ng/mL

## 2016-01-27 MED ORDER — PIPERACILLIN-TAZOBACTAM 3.375 G IVPB
3.3750 g | Freq: Three times a day (TID) | INTRAVENOUS | Status: DC
  Administered 2016-01-27 – 2016-01-30 (×10): 3.375 g via INTRAVENOUS
  Filled 2016-01-27 (×13): qty 50

## 2016-01-27 MED ORDER — SODIUM CHLORIDE 0.9 % IV BOLUS (SEPSIS): 1000.0000 mL | Freq: Once | INTRAVENOUS | Status: DC

## 2016-01-27 MED ORDER — SODIUM CHLORIDE 0.9 % IV SOLN
INTRAVENOUS | Status: DC
  Administered 2016-01-27 – 2016-01-28 (×3): via INTRAVENOUS

## 2016-01-27 MED ORDER — VANCOMYCIN HCL 10 G IV SOLR
2500.0000 mg | Freq: Once | INTRAVENOUS | Status: AC
  Administered 2016-01-27: 2500 mg via INTRAVENOUS
  Filled 2016-01-27: qty 2500

## 2016-01-27 MED ORDER — ENOXAPARIN SODIUM 100 MG/ML ~~LOC~~ SOLN
90.0000 mg | SUBCUTANEOUS | Status: DC
  Administered 2016-01-27: 90 mg via SUBCUTANEOUS
  Filled 2016-01-27 (×2): qty 1

## 2016-01-27 MED ORDER — ACETAMINOPHEN 325 MG PO TABS
650.0000 mg | ORAL_TABLET | Freq: Four times a day (QID) | ORAL | Status: DC | PRN
  Administered 2016-01-28: 650 mg via ORAL
  Filled 2016-01-27: qty 2

## 2016-01-27 MED ORDER — INSULIN ASPART 100 UNIT/ML ~~LOC~~ SOLN
0.0000 [IU] | Freq: Three times a day (TID) | SUBCUTANEOUS | Status: DC
  Administered 2016-01-27: 2 [IU] via SUBCUTANEOUS
  Administered 2016-01-28 – 2016-01-31 (×4): 1 [IU] via SUBCUTANEOUS

## 2016-01-27 MED ORDER — SODIUM CHLORIDE 0.9 % IV BOLUS (SEPSIS): 500.0000 mL | Freq: Once | INTRAVENOUS | Status: DC

## 2016-01-27 MED ORDER — COLCHICINE 0.6 MG PO TABS
0.6000 mg | ORAL_TABLET | Freq: Two times a day (BID) | ORAL | Status: DC
  Administered 2016-01-27 – 2016-02-04 (×17): 0.6 mg via ORAL
  Filled 2016-01-27 (×17): qty 1

## 2016-01-27 MED ORDER — SODIUM CHLORIDE 0.9 % IV BOLUS (SEPSIS)
1000.0000 mL | Freq: Once | INTRAVENOUS | Status: AC
  Administered 2016-01-27: 1000 mL via INTRAVENOUS

## 2016-01-27 MED ORDER — ONDANSETRON HCL 4 MG PO TABS: 4.0000 mg | ORAL_TABLET | Freq: Four times a day (QID) | ORAL | Status: DC | PRN

## 2016-01-27 MED ORDER — VANCOMYCIN HCL 10 G IV SOLR
1750.0000 mg | Freq: Two times a day (BID) | INTRAVENOUS | Status: DC
  Administered 2016-01-27 – 2016-01-29 (×4): 1750 mg via INTRAVENOUS
  Filled 2016-01-27 (×5): qty 1750

## 2016-01-27 MED ORDER — SODIUM CHLORIDE 0.9 % IV SOLN
INTRAVENOUS | Status: DC
  Administered 2016-01-27: 03:00:00 via INTRAVENOUS

## 2016-01-27 MED ORDER — ACETAMINOPHEN 650 MG RE SUPP: 650.0000 mg | Freq: Four times a day (QID) | RECTAL | Status: DC | PRN

## 2016-01-27 MED ORDER — ALLOPURINOL 100 MG PO TABS
200.0000 mg | ORAL_TABLET | Freq: Every day | ORAL | Status: DC
  Administered 2016-01-27 – 2016-02-04 (×9): 200 mg via ORAL
  Filled 2016-01-27 (×10): qty 2

## 2016-01-27 MED ORDER — ONDANSETRON HCL 4 MG/2ML IJ SOLN: 4.0000 mg | Freq: Four times a day (QID) | INTRAMUSCULAR | Status: DC | PRN

## 2016-01-27 MED ORDER — ASPIRIN EC 81 MG PO TBEC
81.0000 mg | DELAYED_RELEASE_TABLET | Freq: Every day | ORAL | Status: DC
  Administered 2016-01-27 – 2016-02-04 (×9): 81 mg via ORAL
  Filled 2016-01-27 (×9): qty 1

## 2016-01-27 MED ORDER — LISINOPRIL 10 MG PO TABS
10.0000 mg | ORAL_TABLET | Freq: Every day | ORAL | Status: DC
  Administered 2016-01-27: 10 mg via ORAL
  Filled 2016-01-27: qty 1

## 2016-01-27 MED ORDER — TETANUS-DIPHTH-ACELL PERTUSSIS 5-2.5-18.5 LF-MCG/0.5 IM SUSP
0.5000 mL | Freq: Once | INTRAMUSCULAR | Status: AC
  Administered 2016-01-27: 0.5 mL via INTRAMUSCULAR
  Filled 2016-01-27: qty 0.5

## 2016-01-27 MED ORDER — ALLOPURINOL 300 MG PO TABS
300.0000 mg | ORAL_TABLET | Freq: Two times a day (BID) | ORAL | Status: DC
  Administered 2016-01-27 – 2016-02-04 (×16): 300 mg via ORAL
  Filled 2016-01-27 (×17): qty 1

## 2016-01-27 MED ORDER — SIMVASTATIN 10 MG PO TABS
10.0000 mg | ORAL_TABLET | Freq: Every day | ORAL | Status: DC
  Administered 2016-01-27 – 2016-02-03 (×8): 10 mg via ORAL
  Filled 2016-01-27 (×8): qty 1

## 2016-01-27 NOTE — Progress Notes (Signed)
ANTICOAGULATION CONSULT NOTE - Initial Consult  Pharmacy Consult for Lovenox Indication: VTE prophylaxis  No Known Allergies  Patient Measurements: Height: 5\' 7"  (170.2 cm) Weight: (!) 423 lb 12.8 oz (192.2 kg) IBW/kg (Calculated) : 66.1  Vital Signs: Temp: 99.2 F (37.3 C) (08/08 0841) Temp Source: Oral (08/08 0841) BP: 103/47 (08/08 0841) Pulse Rate: 94 (08/08 0841)  Labs:  Recent Labs  01/26/16 2325  01/26/16 2346 01/26/16 2349 01/27/16 0304  HGB  --   < > 17.3* 16.0 13.7  HCT  --   --  51.0 47.5 41.0  PLT  --   --   --  225 197  CREATININE 1.29*  --  1.20  --  1.26*  < > = values in this interval not displayed.  Estimated Creatinine Clearance: 109.2 mL/min (by C-G formula based on SCr of 1.26 mg/dL).   Goal of Therapy:  Monitor platelets by anticoagulation protocol: Yes   Plan:  Lovenox 90 mg sq Q 24 hours for VTE prophylaxis Pharmacy to follow peripherally for changes  Thank you Anette Guarneri, PharmD [phone]  01/27/2016,10:28 AM
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
## 2016-01-27 NOTE — ED Notes (Signed)
Called carelink to activate Code Sepsis

## 2016-01-27 NOTE — Progress Notes (Signed)
PHARMACY - PHYSICIAN COMMUNICATION CRITICAL VALUE ALERT - BLOOD CULTURE IDENTIFICATION (BCID)    Results for orders placed or performed during the hospital encounter of 01/26/16  Blood Culture ID Panel (Reflexed) (Collected: 01/26/2016 11:25 PM)  Result Value Ref Range   Enterococcus species NOT DETECTED NOT DETECTED   Vancomycin resistance NOT DETECTED NOT DETECTED   Listeria monocytogenes NOT DETECTED NOT DETECTED   Staphylococcus species NOT DETECTED NOT DETECTED   Staphylococcus aureus NOT DETECTED NOT DETECTED   Methicillin resistance NOT DETECTED NOT DETECTED   Streptococcus species NOT DETECTED NOT DETECTED   Streptococcus agalactiae NOT DETECTED NOT DETECTED   Streptococcus pneumoniae NOT DETECTED NOT DETECTED   Streptococcus pyogenes NOT DETECTED NOT DETECTED   Acinetobacter baumannii NOT DETECTED NOT DETECTED   Enterobacteriaceae species NOT DETECTED NOT DETECTED   Enterobacter cloacae complex NOT DETECTED NOT DETECTED   Escherichia coli NOT DETECTED NOT DETECTED   Klebsiella oxytoca NOT DETECTED NOT DETECTED   Klebsiella pneumoniae NOT DETECTED NOT DETECTED   Proteus species NOT DETECTED NOT DETECTED   Serratia marcescens NOT DETECTED NOT DETECTED   Carbapenem resistance NOT DETECTED NOT DETECTED   Haemophilus influenzae NOT DETECTED NOT DETECTED   Neisseria meningitidis NOT DETECTED NOT DETECTED   Pseudomonas aeruginosa NOT DETECTED NOT DETECTED   Candida albicans NOT DETECTED NOT DETECTED   Candida glabrata NOT DETECTED NOT DETECTED   Candida krusei NOT DETECTED NOT DETECTED   Candida parapsilosis NOT DETECTED NOT DETECTED   Candida tropicalis NOT DETECTED NOT DETECTED    Name of physician (or Provider) Contacted: K. Schorr   Changes to prescribed antibiotics required:  None,  has gram variable rod in 2/2 and on Zosyn and vancomycin.    Vincenza Hews, PharmD, BCPS 01/27/2016, 9:20 PM Pager: [phone]
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
## 2016-01-28 ENCOUNTER — Encounter (HOSPITAL_COMMUNITY): Payer: Medicare HMO

## 2016-01-28 LAB — CBC WITH DIFFERENTIAL/PLATELET
BASOS ABS: 0 10*3/uL (ref 0.0–0.1)
Basophils Relative: 0 %
EOS ABS: 0.2 10*3/uL (ref 0.0–0.7)
EOS PCT: 2 %
HCT: 36.7 % — ABNORMAL LOW (ref 39.0–52.0)
Hemoglobin: 11.9 g/dL — ABNORMAL LOW (ref 13.0–17.0)
Lymphocytes Relative: 14 %
Lymphs Abs: 1.4 10*3/uL (ref 0.7–4.0)
MCH: 31 pg (ref 26.0–34.0)
MCHC: 32.4 g/dL (ref 30.0–36.0)
MCV: 95.6 fL (ref 78.0–100.0)
Monocytes Absolute: 1 10*3/uL (ref 0.1–1.0)
Monocytes Relative: 10 %
Neutro Abs: 7.4 10*3/uL (ref 1.7–7.7)
Neutrophils Relative %: 74 %
PLATELETS: 163 10*3/uL (ref 150–400)
RBC: 3.84 MIL/uL — AB (ref 4.22–5.81)
RDW: 14 % (ref 11.5–15.5)
WBC: 10.1 10*3/uL (ref 4.0–10.5)

## 2016-01-28 LAB — BASIC METABOLIC PANEL
ANION GAP: 8 (ref 5–15)
BUN: 13 mg/dL (ref 6–20)
CALCIUM: 8 mg/dL — AB (ref 8.9–10.3)
CO2: 26 mmol/L (ref 22–32)
Chloride: 103 mmol/L (ref 101–111)
Creatinine, Ser: 1.3 mg/dL — ABNORMAL HIGH (ref 0.61–1.24)
GFR calc Af Amer: >60 mL/min (ref >60)
Glucose, Bld: 119 mg/dL — ABNORMAL HIGH (ref 65–99)
POTASSIUM: 4 mmol/L (ref 3.5–5.1)
SODIUM: 137 mmol/L (ref 135–145)

## 2016-01-28 LAB — GLUCOSE, CAPILLARY
GLUCOSE-CAPILLARY: 117 mg/dL — AB (ref 65–99)
GLUCOSE-CAPILLARY: 130 mg/dL — AB (ref 65–99)
Glucose-Capillary: 107 mg/dL — ABNORMAL HIGH (ref 65–99)
Glucose-Capillary: 103 mg/dL — ABNORMAL HIGH (ref 65–99)

## 2016-01-28 LAB — URINE CULTURE

## 2016-01-28 MED ORDER — ENOXAPARIN SODIUM 100 MG/ML ~~LOC~~ SOLN
100.0000 mg | SUBCUTANEOUS | Status: DC
  Administered 2016-01-29 – 2016-01-30 (×2): 100 mg via SUBCUTANEOUS
  Filled 2016-01-28: qty 1
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
## 2016-01-28 NOTE — Progress Notes (Signed)
Pt a/o, no c/o pain, pt on abx as ordered, VSS, pt stable
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
## 2016-01-28 NOTE — Care Management Note (Signed)
Case Management Note  Patient Details  Name: Ronnie Hernandez MRN: [id number] Date of Birth: 09-12-1960  Subjective/Objective:         Admitted with Cellulitis           Action/Plan: Patient lives at home alone; was using [facility] for [facility] service but does not want to use them again; patient stated " they were backed up and I want to change companies if I can." HHC choice offered, pt chose [facility]. Butch Penny with [facility] called for arrangements. Also pt needs a bariatric walker. PCP - [place] [phone]) Private insurance with [facility], pt also stated that he has be approved for Medicaid. Attending MD at discharge please enter the face to face document in Epic for [facility] services.  Expected Discharge Date:     01/30/2016             Expected Discharge Plan:  [name]  Discharge planning Services  CM Consult     Choice offered to:  Patient  DME Arranged:  [facility] D[facility] Agency:  [place] Arranged:  RN, PT, OT, Nurse's Aide, Social Work [employer] Agency:  [place]  Status of Service:  In process, will continue to follow  Sherrilyn Rist [id number] 01/28/2016, 11:54 AM
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
## 2016-01-28 NOTE — Progress Notes (Signed)
Received call from [facility] with [facility], they cannot accept the patient/his insurance; CM talked to patient and he is in agreement to continue with [facility]. Referral placed as requested. Mindi Slicker [facility] [phone]

## 2016-01-29 ENCOUNTER — Inpatient Hospital Stay (HOSPITAL_COMMUNITY): Payer: Medicare HMO

## 2016-01-29 DIAGNOSIS — R609 Edema, unspecified: Secondary | ICD-10-CM

## 2016-01-29 LAB — GLUCOSE, CAPILLARY
GLUCOSE-CAPILLARY: 125 mg/dL — AB (ref 65–99)
GLUCOSE-CAPILLARY: 119 mg/dL — AB (ref 65–99)
Glucose-Capillary: 117 mg/dL — ABNORMAL HIGH (ref 65–99)
Glucose-Capillary: 129 mg/dL — ABNORMAL HIGH (ref 65–99)

## 2016-01-29 NOTE — Progress Notes (Signed)
**  Preliminary report by tech**   Bilateral lower extremity venous duplex completed. There is no evidence of deep or superficial vein thrombosis in the right or left lower extremity. All visualized vessels appear patent and compressible. There is no evidence of Baker's cysts bilaterally. Limited study due to patient body habitus and positioning.   01/29/16 12:33 PM Ronnie Hernandez RVT

## 2016-01-29 NOTE — Progress Notes (Signed)
OT Cancellation Note  Patient Details Name: Ronnie Hernandez MRN: [id number] DOB: 1960/08/02   Cancelled Treatment:    Reason Eval/Treat Not Completed: Patient at procedure or test/ unavailable   OT checked on pt several times this morning. Pt with PT then having doppler- will check on pt next day. Kari Baars, [place]  Payton Mccallum D 01/29/2016, 12:37 PM
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
## 2016-01-30 LAB — BASIC METABOLIC PANEL
Anion gap: 11 (ref 5–15)
BUN: 11 mg/dL (ref 6–20)
CALCIUM: 9.1 mg/dL (ref 8.9–10.3)
CHLORIDE: 102 mmol/L (ref 101–111)
CO2: 25 mmol/L (ref 22–32)
CREATININE: 1.03 mg/dL (ref 0.61–1.24)
GFR calc Af Amer: >60 mL/min (ref >60)
GFR calc non Af Amer: >60 mL/min (ref >60)
GLUCOSE: 108 mg/dL — AB (ref 65–99)
Potassium: 4.3 mmol/L (ref 3.5–5.1)
Sodium: 138 mmol/L (ref 135–145)

## 2016-01-30 LAB — CULTURE, BLOOD (ROUTINE X 2)

## 2016-01-30 LAB — GLUCOSE, CAPILLARY
GLUCOSE-CAPILLARY: 96 mg/dL (ref 65–99)
Glucose-Capillary: 97 mg/dL (ref 65–99)
Glucose-Capillary: 94 mg/dL (ref 65–99)
Glucose-Capillary: 136 mg/dL — ABNORMAL HIGH (ref 65–99)

## 2016-01-30 MED ORDER — CIPROFLOXACIN IN D5W 400 MG/200ML IV SOLN
400.0000 mg | Freq: Two times a day (BID) | INTRAVENOUS | Status: DC
  Administered 2016-01-30 – 2016-02-04 (×10): 400 mg via INTRAVENOUS
  Filled 2016-01-30 (×10): qty 200

## 2016-01-30 MED ORDER — ENOXAPARIN SODIUM 100 MG/ML ~~LOC~~ SOLN
90.0000 mg | SUBCUTANEOUS | Status: DC
  Administered 2016-01-31 – 2016-02-03 (×5): 90 mg via SUBCUTANEOUS
  Filled 2016-01-30 (×5): qty 1

## 2016-01-30 MED ORDER — FUROSEMIDE 10 MG/ML IJ SOLN
20.0000 mg | Freq: Two times a day (BID) | INTRAMUSCULAR | Status: DC
  Administered 2016-01-30 – 2016-02-04 (×11): 20 mg via INTRAVENOUS
  Filled 2016-01-30 (×11): qty 2
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
## 2016-01-30 NOTE — Care Management Important Message (Signed)
Important Message  Patient Details  Name: Ronnie Hernandez MRN: [id number] Date of Birth: 03/22/1961   Medicare Important Message Given:  Yes    Ronnie Hernandez Montine Circle 01/30/2016, 9:59 AM
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
## 2016-01-31 LAB — GLUCOSE, CAPILLARY
GLUCOSE-CAPILLARY: 111 mg/dL — AB (ref 65–99)
GLUCOSE-CAPILLARY: 127 mg/dL — AB (ref 65–99)
GLUCOSE-CAPILLARY: 103 mg/dL — AB (ref 65–99)
Glucose-Capillary: 98 mg/dL (ref 65–99)

## 2016-01-31 LAB — CBC
HEMATOCRIT: 43 % (ref 39.0–52.0)
HEMOGLOBIN: 14.1 g/dL (ref 13.0–17.0)
MCH: 31.3 pg (ref 26.0–34.0)
MCHC: 32.8 g/dL (ref 30.0–36.0)
MCV: 95.3 fL (ref 78.0–100.0)
Platelets: 224 10*3/uL (ref 150–400)
RBC: 4.51 MIL/uL (ref 4.22–5.81)
RDW: 13.2 % (ref 11.5–15.5)
WBC: 6.2 10*3/uL (ref 4.0–10.5)

## 2016-01-31 LAB — BASIC METABOLIC PANEL
ANION GAP: 10 (ref 5–15)
BUN: 13 mg/dL (ref 6–20)
CHLORIDE: 99 mmol/L — AB (ref 101–111)
CO2: 29 mmol/L (ref 22–32)
CREATININE: 1.01 mg/dL (ref 0.61–1.24)
Calcium: 9.2 mg/dL (ref 8.9–10.3)
GFR calc non Af Amer: >60 mL/min (ref >60)
Glucose, Bld: 103 mg/dL — ABNORMAL HIGH (ref 65–99)
POTASSIUM: 4.2 mmol/L (ref 3.5–5.1)
SODIUM: 138 mmol/L (ref 135–145)
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
## 2016-02-01 LAB — GLUCOSE, CAPILLARY
GLUCOSE-CAPILLARY: 120 mg/dL — AB (ref 65–99)
Glucose-Capillary: 109 mg/dL — ABNORMAL HIGH (ref 65–99)
Glucose-Capillary: 116 mg/dL — ABNORMAL HIGH (ref 65–99)
Glucose-Capillary: 124 mg/dL — ABNORMAL HIGH (ref 65–99)

## 2016-02-01 LAB — BASIC METABOLIC PANEL
Anion gap: 12 (ref 5–15)
BUN: 15 mg/dL (ref 6–20)
CO2: 28 mmol/L (ref 22–32)
CREATININE: 1.04 mg/dL (ref 0.61–1.24)
Calcium: 9.1 mg/dL (ref 8.9–10.3)
Chloride: 96 mmol/L — ABNORMAL LOW (ref 101–111)
GFR calc Af Amer: >60 mL/min (ref >60)
GLUCOSE: 143 mg/dL — AB (ref 65–99)
Potassium: 3.7 mmol/L (ref 3.5–5.1)
SODIUM: 136 mmol/L (ref 135–145)

## 2016-02-01 NOTE — Clinical Social Work Placement (Signed)
   CLINICAL SOCIAL WORK PLACEMENT  NOTE  Date:  02/01/2016  Patient Details  Name: Ronnie Hernandez MRN: [id number] Date of Birth: 1961/06/06  Clinical Social Work is seeking post-discharge placement for this patient at the [place] level of care (*CSW will initial, date and re-position this form in  chart as items are completed):  Yes   Patient/family provided with [place] Work Department's list of facilities offering this level of care within the geographic area requested by the patient (or if unable, by the patient's family).  Yes   Patient/family informed of their freedom to choose among providers that offer the needed level of care, that participate in Medicare, Medicaid or managed care program needed by the patient, have an available bed and are willing to accept the patient.  Yes   Patient/family informed of 's ownership interest in [facility] and [facility], as well as of the fact that they are under no obligation to receive care at these facilities.  PASRR submitted to EDS on 02/01/16 (t)     PASRR number received on 02/01/16     Existing PASRR number confirmed on       FL2 transmitted to all facilities in geographic area requested by pt/family on 02/01/16     FL2 transmitted to all facilities within larger geographic area on       Patient informed that his/her managed care company has contracts with or will negotiate with certain facilities, including the following:            Patient/family informed of bed offers received.  Patient chooses bed at       Physician recommends and patient chooses bed at      Patient to be transferred to   on  .  Patient to be transferred to facility by       Patient family notified on   of transfer.  Name of family member notified:        PHYSICIAN       Additional Comment:    _______________________________________________ Roanna Raider, LCSW 02/01/2016, 3:42 PM

## 2016-02-01 NOTE — Clinical Social Work Note (Signed)
Clinical Social Work Assessment  Patient Details  Name: Ronnie Hernandez MRN: [id number] Date of Birth: Sep 01, 1960  Date of referral:  02/01/16               Reason for consult:  Facility Placement                Permission sought to share information with:  Facility Art therapist granted to share information::  Yes, Verbal Permission Granted  Name::        Agency::     Relationship::     Contact Information:     Housing/Transportation Living arrangements for the past 2 months:  [place] of Information:  Patient Patient Interpreter Needed:  None Criminal Activity/Legal Involvement Pertinent to Current Situation/Hospitalization:  No - Comment as needed Significant Relationships:    Lives with:  Self Do you feel safe going back to the place where you live?  No Need for family participation in patient care:  No (Coment)  Care giving concerns: Pt lives alone and does not have 24 hr assistance/supervision.  He has limited mobility and requires assistance with his ADLs.   Social Worker assessment / plan:  CSW met with pt to discuss role of CSW/d/c planning.  At this time, pt is agreeable to SNF level therapies to maximize his independence in order to be able to return home.  SNF search initiated.  CSW will facilitate SNF tx as appropriate.  Employment status:  Disabled (Comment on whether or not currently receiving Disability) Insurance information:  Programmer, applications, Medicaid In [place] PT Recommendations:  [place] / Referral to community resources:  [place]  Patient/Family's Response to care: Agreeable to SNF placement.  Patient/Family's Understanding of and Emotional Response to Diagnosis, Current Treatment, and Prognosis:  Pt highly motivated to improve his health in order to improve his quality of life and longevity. Pt anxious to being work with therapy so he can return home and live  independently. Emotional Assessment Appearance:  Appears stated age Attitude/Demeanor/Rapport:   (cooperative, happy) Affect (typically observed):  Accepting, Pleasant Orientation:  Oriented to Self, Oriented to Place, Oriented to  Time, Oriented to Situation Alcohol / Substance use:  Not Applicable Psych involvement (Current and /or in the community):  No (Comment)  Discharge Needs  Concerns to be addressed:  Discharge Planning Concerns Readmission within the last 30 days:  No Current discharge risk:  Chronically ill, Physical Impairment, Lives alone Barriers to Discharge:  No Barriers Identified   Arslan Kier, Miachel Roux, LCSW 02/01/2016, 3:21 PM

## 2016-02-01 NOTE — Progress Notes (Signed)
Order received for SNF placement. Chart reviewed.  Plan for pt to return home at d/c with [facility], noted.  CSW signing off.  Please re-reconsult as necessary.  Creta Levin, LCSW Weekend Coverage [phone]
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
## 2016-02-01 NOTE — NC FL2 (Signed)
[place] LEVEL OF CARE SCREENING TOOL     IDENTIFICATION  Patient Name: Ronnie Hernandez Birthdate: 1960-07-28 Sex: male Admission Date (Current Location): 01/26/2016  [facility] and [place] Number:  Herbalist and Address:  The . [facility], [place] [address] [place], [place] [address]      Provider Number: [id number]  Attending Physician Name and Address:  Oswald Hillock, MD  Relative Name and Phone Number:       Current Level of Care: SNF Recommended Level of Care: [place] Prior Approval Number:    Date Approved/Denied:   PASRR Number:    Discharge Plan: SNF    Current Diagnoses: Patient Active Problem List   Diagnosis Date Noted  . Cellulitis of left lower extremity 01/27/2016  . DM type 2 (diabetes mellitus, type 2) ([place]) 12/25/2013  . Drug-induced gout 12/25/2013  . Hyperlipidemia 08/01/2013  . Morbidly obese ([place])   . Hypertension   . Diabetes type 2, uncontrolled ([place])   . Gout of hand   . Colon cancer screening 07/27/2011  . Benign neoplasm of colon 07/27/2011    Orientation RESPIRATION BLADDER Height & Weight     Self, Time, Situation, Place  Normal Continent Weight: (!) 437 lb (198.2 kg) (scale c) Height:  5\' 7"  (170.2 cm)  BEHAVIORAL SYMPTOMS/MOOD NEUROLOGICAL BOWEL NUTRITION STATUS      Continent Diet (heart healthy/carb modified)  AMBULATORY STATUS COMMUNICATION OF NEEDS Skin   Limited Assist Verbally                         Personal Care Assistance Level of Assistance  Bathing, Feeding, Dressing Bathing Assistance: Limited assistance Feeding assistance: Independent Dressing Assistance: Limited assistance     Functional Limitations Info  Sight, Hearing, Speech Sight Info: Adequate Hearing Info: Adequate Speech Info: Adequate    SPECIAL CARE FACTORS FREQUENCY  OT (By licensed OT)     PT Frequency:  (5x/week) OT Frequency:  (5x/week)            Contractures  Contractures Info: Not present    Additional Factors Info  Insulin Sliding Scale, Allergies Code Status Info:  (full code) Allergies Info:  (NKDA)   Insulin Sliding Scale Info:  (3x daily with meals)       Current Medications (02/01/2016):  This is the current hospital active medication list Current Facility-Administered Medications  Medication Dose Route Frequency Provider Last Rate Last Dose  . 0.9 %  sodium chloride infusion   Intravenous Continuous Oswald Hillock, MD 10 mL/hr at 01/29/16 1147    . acetaminophen (TYLENOL) tablet 650 mg  650 mg Oral Q6H PRN Rise Patience, MD   650 mg at 01/28/16 0946   Or  . acetaminophen (TYLENOL) suppository 650 mg  650 mg Rectal Q6H PRN Rise Patience, MD      . allopurinol (ZYLOPRIM) tablet 200 mg  200 mg Oral Q1500 Rise Patience, MD   200 mg at 02/01/16 1358  . allopurinol (ZYLOPRIM) tablet 300 mg  300 mg Oral BID Rise Patience, MD   300 mg at 02/01/16 0839  . aspirin EC tablet 81 mg  81 mg Oral Daily Rise Patience, MD   81 mg at 02/01/16 0839  . ciprofloxacin (CIPRO) IVPB 400 mg  400 mg Intravenous Q12H Oswald Hillock, MD   400 mg at 02/01/16 1354  . colchicine tablet 0.6 mg  0.6 mg Oral  BID Rise Patience, MD   0.6 mg at 02/01/16 0839  . enoxaparin (LOVENOX) injection 90 mg  90 mg Subcutaneous Q24H Oswald Hillock, MD   90 mg at 02/01/16 1214  . furosemide (LASIX) injection 20 mg  20 mg Intravenous Q12H Oswald Hillock, MD   20 mg at 02/01/16 0839  . insulin aspart (novoLOG) injection 0-9 Units  0-9 Units Subcutaneous TID WC Rise Patience, MD   1 Units at 01/31/16 1133  . ondansetron (ZOFRAN) tablet 4 mg  4 mg Oral Q6H PRN Rise Patience, MD       Or  . ondansetron [facility]) injection 4 mg  4 mg Intravenous Q6H PRN Rise Patience, MD      . simvastatin (ZOCOR) tablet 10 mg  10 mg Oral QHS Rise Patience, MD   10 mg at 01/31/16 2304     Discharge Medications: Please see discharge summary for a list  of discharge medications.  Relevant Imaging Results:  Relevant Lab Results:   Additional Information    Skylan Gift M, LCSW

## 2016-02-02 LAB — BASIC METABOLIC PANEL
ANION GAP: 7 (ref 5–15)
BUN: 18 mg/dL (ref 6–20)
CHLORIDE: 100 mmol/L — AB (ref 101–111)
CO2: 28 mmol/L (ref 22–32)
Calcium: 8.9 mg/dL (ref 8.9–10.3)
Creatinine, Ser: 1.1 mg/dL (ref 0.61–1.24)
GFR calc Af Amer: >60 mL/min (ref >60)
GLUCOSE: 138 mg/dL — AB (ref 65–99)
POTASSIUM: 3.8 mmol/L (ref 3.5–5.1)
Sodium: 135 mmol/L (ref 135–145)

## 2016-02-02 LAB — GLUCOSE, CAPILLARY
GLUCOSE-CAPILLARY: 115 mg/dL — AB (ref 65–99)
GLUCOSE-CAPILLARY: 109 mg/dL — AB (ref 65–99)
GLUCOSE-CAPILLARY: 116 mg/dL — AB (ref 65–99)
Glucose-Capillary: 120 mg/dL — ABNORMAL HIGH (ref 65–99)
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
## 2016-02-02 NOTE — Care Management Important Message (Signed)
Important Message  Patient Details  Name: Ronnie Hernandez MRN: [id number] Date of Birth: 12-23-1960   Medicare Important Message Given:  Yes    Ennifer Harston [address] 02/02/2016, 11:24 AM

## 2016-02-03 ENCOUNTER — Inpatient Hospital Stay (HOSPITAL_COMMUNITY): Payer: Medicare HMO

## 2016-02-03 DIAGNOSIS — I509 Heart failure, unspecified: Secondary | ICD-10-CM

## 2016-02-03 LAB — BASIC METABOLIC PANEL
Anion gap: 7 (ref 5–15)
BUN: 20 mg/dL (ref 6–20)
CALCIUM: 9.1 mg/dL (ref 8.9–10.3)
CO2: 29 mmol/L (ref 22–32)
CREATININE: 1.16 mg/dL (ref 0.61–1.24)
Chloride: 100 mmol/L — ABNORMAL LOW (ref 101–111)
GFR calc Af Amer: >60 mL/min (ref >60)
GFR calc non Af Amer: >60 mL/min (ref >60)
GLUCOSE: 131 mg/dL — AB (ref 65–99)
Potassium: 3.8 mmol/L (ref 3.5–5.1)
Sodium: 136 mmol/L (ref 135–145)

## 2016-02-03 LAB — ECHOCARDIOGRAM COMPLETE
HEIGHTINCHES: 67 in
WEIGHTICAEL: 6777.6 [oz_av]

## 2016-02-03 LAB — GLUCOSE, CAPILLARY
GLUCOSE-CAPILLARY: 86 mg/dL (ref 65–99)
Glucose-Capillary: 114 mg/dL — ABNORMAL HIGH (ref 65–99)
Glucose-Capillary: 104 mg/dL — ABNORMAL HIGH (ref 65–99)
Glucose-Capillary: 110 mg/dL — ABNORMAL HIGH (ref 65–99)

## 2016-02-03 MED ORDER — PERFLUTREN LIPID MICROSPHERE
1.0000 mL | INTRAVENOUS | Status: AC | PRN
Start: 2016-02-03 — End: 2016-02-03
  Administered 2016-02-03: 2 mL via INTRAVENOUS
  Filled 2016-02-03: qty 10

## 2016-02-03 NOTE — Progress Notes (Signed)
  Echocardiogram 2D Echocardiogram has been performed.  Ronnie Hernandez 02/03/2016, 5:08 PM
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
## 2016-02-03 NOTE — Progress Notes (Signed)
Patient able to discharge to [facility] when medically stable.  Percell Locus Lilianah Buffin LCSWA [phone]
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
## 2016-02-04 DIAGNOSIS — I10 Essential (primary) hypertension: Secondary | ICD-10-CM

## 2016-02-04 DIAGNOSIS — Z794 Long term (current) use of insulin: Secondary | ICD-10-CM

## 2016-02-04 DIAGNOSIS — L03116 Cellulitis of left lower limb: Secondary | ICD-10-CM | POA: Diagnosis not present

## 2016-02-04 DIAGNOSIS — L03119 Cellulitis of unspecified part of limb: Secondary | ICD-10-CM | POA: Diagnosis not present

## 2016-02-04 DIAGNOSIS — E119 Type 2 diabetes mellitus without complications: Secondary | ICD-10-CM

## 2016-02-04 DIAGNOSIS — R262 Difficulty in walking, not elsewhere classified: Secondary | ICD-10-CM | POA: Diagnosis not present

## 2016-02-04 DIAGNOSIS — M1A9XX1 Chronic gout, unspecified, with tophus (tophi): Secondary | ICD-10-CM | POA: Diagnosis not present

## 2016-02-04 DIAGNOSIS — R7881 Bacteremia: Secondary | ICD-10-CM | POA: Diagnosis not present

## 2016-02-04 DIAGNOSIS — R600 Localized edema: Secondary | ICD-10-CM | POA: Diagnosis not present

## 2016-02-04 DIAGNOSIS — E876 Hypokalemia: Secondary | ICD-10-CM | POA: Diagnosis not present

## 2016-02-04 DIAGNOSIS — B999 Unspecified infectious disease: Secondary | ICD-10-CM | POA: Diagnosis not present

## 2016-02-04 DIAGNOSIS — A419 Sepsis, unspecified organism: Secondary | ICD-10-CM | POA: Diagnosis not present

## 2016-02-04 DIAGNOSIS — E1100 Type 2 diabetes mellitus with hyperosmolarity without nonketotic hyperglycemic-hyperosmolar coma (NKHHC): Secondary | ICD-10-CM | POA: Diagnosis not present

## 2016-02-04 DIAGNOSIS — R269 Unspecified abnormalities of gait and mobility: Secondary | ICD-10-CM | POA: Diagnosis not present

## 2016-02-04 DIAGNOSIS — B965 Pseudomonas (aeruginosa) (mallei) (pseudomallei) as the cause of diseases classified elsewhere: Secondary | ICD-10-CM | POA: Diagnosis not present

## 2016-02-04 DIAGNOSIS — E785 Hyperlipidemia, unspecified: Secondary | ICD-10-CM | POA: Diagnosis not present

## 2016-02-04 DIAGNOSIS — M6281 Muscle weakness (generalized): Secondary | ICD-10-CM | POA: Diagnosis not present

## 2016-02-04 DIAGNOSIS — E669 Obesity, unspecified: Secondary | ICD-10-CM | POA: Diagnosis not present

## 2016-02-04 DIAGNOSIS — M1029 Drug-induced gout, multiple sites: Secondary | ICD-10-CM | POA: Diagnosis not present

## 2016-02-04 DIAGNOSIS — R5381 Other malaise: Secondary | ICD-10-CM | POA: Diagnosis not present

## 2016-02-04 DIAGNOSIS — M10049 Idiopathic gout, unspecified hand: Secondary | ICD-10-CM | POA: Diagnosis not present

## 2016-02-04 DIAGNOSIS — R278 Other lack of coordination: Secondary | ICD-10-CM | POA: Diagnosis not present

## 2016-02-04 LAB — GLUCOSE, CAPILLARY
GLUCOSE-CAPILLARY: 99 mg/dL (ref 65–99)
Glucose-Capillary: 103 mg/dL — ABNORMAL HIGH (ref 65–99)

## 2016-02-04 MED ORDER — CIPROFLOXACIN HCL 500 MG PO TABS: 500.0000 mg | ORAL_TABLET | Freq: Two times a day (BID) | ORAL | 0 refills | Status: AC

## 2016-02-04 MED ORDER — POTASSIUM CHLORIDE ER 10 MEQ PO TBCR: 10.0000 meq | EXTENDED_RELEASE_TABLET | Freq: Every day | ORAL | 0 refills | Status: DC

## 2016-02-04 MED ORDER — FUROSEMIDE 20 MG PO TABS: 20.0000 mg | ORAL_TABLET | Freq: Two times a day (BID) | ORAL | 0 refills | Status: DC

## 2016-02-04 MED ORDER — CIPROFLOXACIN HCL 500 MG PO TABS
500.0000 mg | ORAL_TABLET | Freq: Once | ORAL | Status: AC
  Administered 2016-02-04: 500 mg via ORAL
  Filled 2016-02-04: qty 1

## 2016-02-04 NOTE — Progress Notes (Addendum)
Pt has orders to be discharged to [facility]. Discharge instructions given and pt has no additional questions at this time. Medication regimen reviewed and pt educated. Pt verbalized understanding and has no additional questions. Telemetry box removed. IV removed and site in good condition. Pt stable and waiting for transportation. Report called to [facility] but did not get answer. LM to call back for report.  Maurene Capes RN
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
## 2016-02-04 NOTE — Progress Notes (Signed)
PT Cancellation Note  Patient Details Name: Ronnie Hernandez MRN: [id number] DOB: February 19, 1961   Cancelled Treatment:    Reason Eval/Treat Not Completed: Other (comment) (Pt preparing for d/c to SNF).   Collie Siad PT, DPT  Pager: [phone] Phone: [phone] 02/04/2016, 1:38 PM

## 2016-02-04 NOTE — Clinical Social Work Placement (Signed)
   CLINICAL SOCIAL WORK PLACEMENT  NOTE  Date:  02/04/2016  Patient Details  Name: Ronnie Hernandez MRN: [id number] Date of Birth: 1961/03/20  Clinical Social Work is seeking post-discharge placement for this patient at the [place] level of care (*CSW will initial, date and re-position this form in  chart as items are completed):  Yes   Patient/family provided with [place] [facility] [place] Work Department's list of facilities offering this level of care within the geographic area requested by the patient (or if unable, by the patient's family).  Yes   Patient/family informed of their freedom to choose among providers that offer the needed level of care, that participate in Medicare, Medicaid or managed care program needed by the patient, have an available bed and are willing to accept the patient.  Yes   Patient/family informed of [facility]'s ownership interest in [facility] and [facility], as well as of the fact that they are under no obligation to receive care at these facilities.  PASRR submitted to EDS on 02/01/16 (t)     PASRR number received on 02/01/16     Existing PASRR number confirmed on       FL2 transmitted to all facilities in geographic area requested by pt/family on 02/01/16     FL2 transmitted to all facilities within larger geographic area on       Patient informed that his/her managed care company has contracts with or will negotiate with certain facilities, including the following:        Yes   Patient/family informed of bed offers received.  Patient chooses bed at [facility]     Physician recommends and patient chooses bed at      Patient to be transferred to [name] on  .  Patient to be transferred to facility by Ambulance      Patient family notified on 02/04/16 of transfer.  Name of family member notified:        PHYSICIAN Please prepare priority discharge summary, including medications, Please prepare  prescriptions, Please sign FL2     Additional Comment:  Per MD patient is ready to discharge to [name]. RN, patient, and facility notified of discharge. Patient refused to have CSW call family and reported he would call family to notify them of his discharge to [name] given phone number for report and transport packet is on patient's chart. Ambulance transport requested. CSW signing off.   _______________________________________________ Samule Dry, LCSW 02/04/2016, 1:51 PM

## 2016-02-05 ENCOUNTER — Non-Acute Institutional Stay (SKILLED_NURSING_FACILITY): Payer: Medicare HMO | Admitting: Internal Medicine

## 2016-02-05 ENCOUNTER — Encounter: Payer: Self-pay | Admitting: Internal Medicine

## 2016-02-05 DIAGNOSIS — E785 Hyperlipidemia, unspecified: Secondary | ICD-10-CM

## 2016-02-05 DIAGNOSIS — R5381 Other malaise: Secondary | ICD-10-CM

## 2016-02-05 DIAGNOSIS — E119 Type 2 diabetes mellitus without complications: Secondary | ICD-10-CM | POA: Diagnosis not present

## 2016-02-05 DIAGNOSIS — E876 Hypokalemia: Secondary | ICD-10-CM | POA: Diagnosis not present

## 2016-02-05 DIAGNOSIS — R600 Localized edema: Secondary | ICD-10-CM

## 2016-02-05 DIAGNOSIS — M1A9XX1 Chronic gout, unspecified, with tophus (tophi): Secondary | ICD-10-CM | POA: Diagnosis not present

## 2016-02-05 DIAGNOSIS — E1169 Type 2 diabetes mellitus with other specified complication: Secondary | ICD-10-CM

## 2016-02-05 DIAGNOSIS — I10 Essential (primary) hypertension: Secondary | ICD-10-CM | POA: Diagnosis not present

## 2016-02-05 DIAGNOSIS — L03116 Cellulitis of left lower limb: Secondary | ICD-10-CM | POA: Diagnosis not present

## 2016-02-05 DIAGNOSIS — B965 Pseudomonas (aeruginosa) (mallei) (pseudomallei) as the cause of diseases classified elsewhere: Secondary | ICD-10-CM

## 2016-02-05 DIAGNOSIS — A498 Other bacterial infections of unspecified site: Secondary | ICD-10-CM

## 2016-02-05 DIAGNOSIS — E669 Obesity, unspecified: Secondary | ICD-10-CM
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
## 2016-02-10 ENCOUNTER — Ambulatory Visit: Payer: Medicare HMO | Admitting: Nurse Practitioner

## 2016-02-18 ENCOUNTER — Non-Acute Institutional Stay (SKILLED_NURSING_FACILITY): Payer: Medicare HMO | Admitting: Family

## 2016-02-18 DIAGNOSIS — M10049 Idiopathic gout, unspecified hand: Secondary | ICD-10-CM | POA: Diagnosis not present

## 2016-02-18 DIAGNOSIS — E785 Hyperlipidemia, unspecified: Secondary | ICD-10-CM | POA: Diagnosis not present

## 2016-02-18 DIAGNOSIS — I10 Essential (primary) hypertension: Secondary | ICD-10-CM | POA: Diagnosis not present

## 2016-02-18 DIAGNOSIS — M109 Gout, unspecified: Secondary | ICD-10-CM

## 2016-02-18 DIAGNOSIS — E1100 Type 2 diabetes mellitus with hyperosmolarity without nonketotic hyperglycemic-hyperosmolar coma (NKHHC): Secondary | ICD-10-CM

## 2016-02-18 DIAGNOSIS — R269 Unspecified abnormalities of gait and mobility: Secondary | ICD-10-CM

## 2016-02-18 DIAGNOSIS — R600 Localized edema: Secondary | ICD-10-CM

## 2016-02-18 DIAGNOSIS — L03116 Cellulitis of left lower limb: Secondary | ICD-10-CM | POA: Diagnosis not present

## 2016-02-18 NOTE — Progress Notes (Signed)
Location:   [facility] Room Number: [place] of Service:  SNF [phone])  Provider: Marlowe Sax FNP-C   PCP: [name], NP [name], NP as PCP - General (Nurse Practitioner)  Extended Emergency Contact Information Primary Emergency Contact: Hopwood,[name] Address: [address] [place], [place] [address] [place] Phone: [phone] Mobile Phone: [phone] Relation: Niece  Code Status:Full Code  Goals of care:  Advanced Directive information Advanced Directives 01/27/2016  Does patient have an advance directive? No  Would patient like information on creating an advanced directive? No - patient declined information  Pre-existing out of facility DNR order (yellow form or pink MOST form) -     No Known Allergies  Chief Complaint  Patient presents with  . Discharge Note    HPI:  55 y.o. male  Seen today at [facility] for discharge home. He was here for short term rehabilitation post hospital admission from 01/26/16-02/04/16 with left leg cellulitis with pseudomonas bacteremia. He was treated on IV antibiotic and later switched to oral Cipro on discharged. During his Hospital admission he also received IV lasix and was diuresed 12 liters.He has a medical history of HTN, Type 2 DM, Gout, Morbid obesity and  Hyperlipidemia. He has completed a course of 14 days oral Cipro 02/09/2016 here in Rehab. His left leg ulcer is resolved now on compression wrap to left leg with kerlix and ACE changed every 4 days. He has worked well with PT/OT now stable for discharge home.He will be discharged home with Home health PT/OT to continue with ROM, Exercise, Gait stability and muscle strengthening.He will also require a [place] RN to manage left leg previous ulcer and to apply compresion wrap. He will  require DME Tub Bench to enable him to maintain current level of independence with  ADL's.He will also require an electric lift chair to enable him to elevate lower extremities due to edema. Home health services will be arranged by facility social worker prior to discharge.He will be discharged home with medication from the facility. Prescription medication will be written x 1 month then patient to follow up with PCP in 1-2 weeks.He denies any acute issues this visit. Facility staff report no new concerns.      Past Medical History:  Diagnosis Date  . Arthritis    right fingers, both knees  . Diabetes type 2, uncontrolled ([place])   . Gout of hand   . Hyperlipidemia   . Hypertension   . Morbidly obese [facility])     Past Surgical History:  Procedure Laterality Date  . unremarkable        reports that he quit smoking about 34 years ago. He has never used smokeless tobacco. He reports that he does not drink alcohol or use drugs. Social History   Social History  . Marital status: Single    Spouse name: N/A  . Number of children: N/A  . Years of education: N/A   Occupational History  . Not on file.   Social History Main Topics  . Smoking status: Former Smoker    Quit date: 02/09/1982  . Smokeless tobacco: Never Used  . Alcohol use No  . Drug use: No  . Sexual activity: Not on file   Other Topics Concern  . Not on file   Social History Narrative  . No narrative on file  Functional Status Survey:    No Known Allergies  Pertinent  Health Maintenance Due  Topic Date Due  . FOOT EXAM  12/26/2014  . OPHTHALMOLOGY EXAM  01/03/2015  . INFLUENZA VACCINE  01/20/2016  . HEMOGLOBIN A1C  06/05/2016  . COLONOSCOPY  07/26/2021    Medications:   Medication List       Accurate as of 02/18/16  6:49 PM. Always use your most recent med list.          allopurinol 100 MG tablet Commonly known as:  ZYLOPRIM To take 200 mg mid day- take in addition to 300 mg twice daily   aspirin 81 MG EC tablet TAKE 1 TABLET (81 MG TOTAL) BY MOUTH DAILY.   colchicine 0.6 MG  tablet TAKE 1 TABLET (0.6 MG TOTAL) BY MOUTH 2 (TWO) TIMES DAILY.   furosemide 20 MG tablet Commonly known as:  LASIX Take 1 tablet (20 mg total) by mouth 2 (two) times daily. Use as needed   lisinopril 10 MG tablet Commonly known as:  PRINIVIL,ZESTRIL Take 1 tablet (10 mg total) by mouth daily.   metFORMIN 500 MG tablet Commonly known as:  GLUCOPHAGE Take 1 tablet by mouth twice a day with food.   potassium chloride 10 MEQ tablet Commonly known as:  K-DUR Take 1 tablet (10 mEq total) by mouth daily.   simvastatin 10 MG tablet Commonly known as:  ZOCOR Take 1 tablet (10 mg total) by mouth at bedtime.       Review of Systems  Constitutional: Negative for activity change, appetite change, chills and fever.  HENT: Negative for congestion, rhinorrhea, sinus pressure, sneezing and sore throat.   Eyes: Negative.   Respiratory: Negative for cough, chest tightness, shortness of breath and wheezing.   Cardiovascular: Positive for leg swelling. Negative for chest pain and palpitations.  Gastrointestinal: Negative for abdominal distention, abdominal pain, constipation, diarrhea, nausea and vomiting.  Endocrine: Negative.   Genitourinary: Negative for dysuria, flank pain, frequency and urgency.  Musculoskeletal: Positive for gait problem.  Skin: Negative for color change, pallor and rash.       Left leg Wrap   Neurological: Negative for dizziness, seizures, syncope, light-headedness and headaches.  Hematological: Does not bruise/bleed easily.  Psychiatric/Behavioral: Negative for agitation, confusion, hallucinations and sleep disturbance. The patient is not nervous/anxious.     Vitals:   02/18/16 1145  BP: 136/76  Pulse: 88  Resp: 18  Temp: 98.2 F (36.8 C)  SpO2: 96%  Weight: (!) 454 lb (205.9 kg)  Height: 5\' 7"  (1.702 m)   Body mass index is 71.11 kg/m. Physical Exam  Constitutional: He is oriented to person, place, and time. No distress.  Morbid obese  HENT:  Head:  Normocephalic.  Mouth/Throat: Oropharynx is clear and moist. No oropharyngeal exudate.  Eyes: Conjunctivae and EOM are normal. Pupils are equal, round, and reactive to light. Right eye exhibits no discharge. Left eye exhibits no discharge. No scleral icterus.  Neck: Normal range of motion. No JVD present. No thyromegaly present.  Cardiovascular: Normal rate, regular rhythm, normal heart sounds and intact distal pulses.  Exam reveals no gallop and no friction rub.   No murmur heard. Pulmonary/Chest: Effort normal and breath sounds normal. No respiratory distress. He has no wheezes. He has no rales.  Abdominal: Soft. Bowel sounds are normal. He exhibits no distension. There is no tenderness. There is no rebound and no guarding.  Genitourinary:  Genitourinary Comments: Continent   Musculoskeletal: Normal range of motion. He exhibits  no tenderness or deformity.  Abnormal gait. Bilateral Lower extremities 1-2 + edema.   Lymphadenopathy:    He has no cervical adenopathy.  Neurological: He is oriented to person, place, and time.  Skin: Skin is warm and dry. No rash noted. No erythema. No pallor.  Left leg ACE/Kerlix wrap drsg dry clean and intact managed by wound Nurse.   Psychiatric: He has a normal mood and affect.    Labs reviewed: Basic Metabolic Panel:  Recent Labs  02/01/16 0400 02/02/16 0445 02/03/16 0410  NA 136 135 136  K 3.7 3.8 3.8  CL 96* 100* 100*  CO2 28 28 29   GLUCOSE 143* 138* 131*  BUN 15 18 20   CREATININE 1.04 1.10 1.16  CALCIUM 9.1 8.9 9.1   Liver Function Tests:  Recent Labs  01/26/16 2325 01/27/16 0304  AST 34 26  ALT 37 29  ALKPHOS 104 86  BILITOT 0.8 0.5  PROT 7.9 6.5  ALBUMIN 3.7 2.9*   CBC:  Recent Labs  01/26/16 2349 01/27/16 0304 01/28/16 0510 01/31/16 0754  WBC 21.9* 22.0* 10.1 6.2  NEUTROABS 19.5* 19.3* 7.4  --   HGB 16.0 13.7 11.9* 14.1  HCT 47.5 41.0 36.7* 43.0  MCV 95.8 94.7 95.6 95.3  PLT 225 197 163 224   CBG:  Recent  Labs  02/03/16 2143 02/04/16 0551 02/04/16 1131  GLUCAP 110* 103* 99    Assessment/Plan:   1. Essential hypertension B/p stable. Continue on lisinopril 10 mg tablet. BMP in 1-2 weeks with PCP   2. Type 2 diabetes mellitus with hyperosmolarity without coma, without long-term current use of insulin (HCC) CBG's controlled ranging in 80's-120's. Continue with Metformin 500 mg Tablet. Continue ACEI and Statin. Hgb A1C with PCP. Annual Eye exam and Foot exam defered to PCP.   3. Hyperlipidemia Continue on Simvastatin 10 mg Tablet. Monitor Lipid panel.   4. Acute gout of hand, unspecified cause, unspecified laterality Stable. Continue on Allopurinol and Colchicine. Consider discontinuing Colchicine.   5. Cellulitis of left lower extremity Afebrile.Completed 14 days course of Cipro post hospital admission. Ulcer resolved. [place] RN to manage Compression wrap ACE/Kerlix change every 4 days.   6. Abnormality of gait has worked well with PT/OT now stable for discharge home with Home health PT/OT to continue with ROM, Exercise, Gait stability and muscle strengthening. He will  require DME: Tub Bench to enable him to maintain current level of independence with ADL's.He will also require an electric lift chair to enable him to elevate lower extremities due to edema.  7. Edema  Continue on Furosemide 20 mg twice daily and 20 mg Tablet daily for weight gain > 3 lbs in 2 days.He will require an electric lift chair to enable him to elevate lower extremities due to edema. BMP in 1-2 weeks with PCP.    Patient is being discharged with the following home health services:    PT/OT to continue with ROM, Exercise, Gait stability and muscle strengthening.  [place] RN to manage Compression wrap ACE/Kerlix change every 4 days.    Patient is being discharged with the following durable medical equipment:    Tub Bench to enable him to maintain current level of independence with ADL's  An electric lift chair to  enable him to elevate lower extremities due to edema.  Patient has been advised to f/u with their PCP in 1-2 weeks to bring them up to date on their rehab stay.  Social services at facility was responsible for arranging this  appointment.  Pt was provided with a 30 day supply of prescriptions for medications and refills must be obtained from their PCP.  For controlled substances, a more limited supply may be provided adequate until PCP appointment only.  Future labs/tests needed:  CBC, BMP in 1-2 weeks with PCP

## 2016-02-22 DIAGNOSIS — M10049 Idiopathic gout, unspecified hand: Secondary | ICD-10-CM | POA: Diagnosis not present

## 2016-02-22 DIAGNOSIS — L03116 Cellulitis of left lower limb: Secondary | ICD-10-CM | POA: Diagnosis not present

## 2016-02-22 DIAGNOSIS — Z7984 Long term (current) use of oral hypoglycemic drugs: Secondary | ICD-10-CM | POA: Diagnosis not present

## 2016-02-22 DIAGNOSIS — M170 Bilateral primary osteoarthritis of knee: Secondary | ICD-10-CM | POA: Diagnosis not present

## 2016-02-22 DIAGNOSIS — I10 Essential (primary) hypertension: Secondary | ICD-10-CM | POA: Diagnosis not present

## 2016-02-22 DIAGNOSIS — E785 Hyperlipidemia, unspecified: Secondary | ICD-10-CM | POA: Diagnosis not present

## 2016-02-22 DIAGNOSIS — Z87891 Personal history of nicotine dependence: Secondary | ICD-10-CM | POA: Diagnosis not present

## 2016-02-22 DIAGNOSIS — E669 Obesity, unspecified: Secondary | ICD-10-CM | POA: Diagnosis not present

## 2016-02-22 DIAGNOSIS — E119 Type 2 diabetes mellitus without complications: Secondary | ICD-10-CM | POA: Diagnosis not present

## 2016-02-24 DIAGNOSIS — M10049 Idiopathic gout, unspecified hand: Secondary | ICD-10-CM | POA: Diagnosis not present

## 2016-02-24 DIAGNOSIS — L03116 Cellulitis of left lower limb: Secondary | ICD-10-CM | POA: Diagnosis not present

## 2016-02-24 DIAGNOSIS — E119 Type 2 diabetes mellitus without complications: Secondary | ICD-10-CM | POA: Diagnosis not present

## 2016-02-24 DIAGNOSIS — E785 Hyperlipidemia, unspecified: Secondary | ICD-10-CM | POA: Diagnosis not present

## 2016-02-24 DIAGNOSIS — E669 Obesity, unspecified: Secondary | ICD-10-CM | POA: Diagnosis not present

## 2016-02-24 DIAGNOSIS — Z7984 Long term (current) use of oral hypoglycemic drugs: Secondary | ICD-10-CM | POA: Diagnosis not present

## 2016-02-24 DIAGNOSIS — Z87891 Personal history of nicotine dependence: Secondary | ICD-10-CM | POA: Diagnosis not present

## 2016-02-24 DIAGNOSIS — I10 Essential (primary) hypertension: Secondary | ICD-10-CM | POA: Diagnosis not present

## 2016-02-24 DIAGNOSIS — M170 Bilateral primary osteoarthritis of knee: Secondary | ICD-10-CM | POA: Diagnosis not present

## 2016-02-25 DIAGNOSIS — I10 Essential (primary) hypertension: Secondary | ICD-10-CM | POA: Diagnosis not present

## 2016-02-25 DIAGNOSIS — E119 Type 2 diabetes mellitus without complications: Secondary | ICD-10-CM | POA: Diagnosis not present

## 2016-02-25 DIAGNOSIS — E785 Hyperlipidemia, unspecified: Secondary | ICD-10-CM | POA: Diagnosis not present

## 2016-02-25 DIAGNOSIS — M170 Bilateral primary osteoarthritis of knee: Secondary | ICD-10-CM | POA: Diagnosis not present

## 2016-02-25 DIAGNOSIS — M10049 Idiopathic gout, unspecified hand: Secondary | ICD-10-CM | POA: Diagnosis not present

## 2016-02-25 DIAGNOSIS — E669 Obesity, unspecified: Secondary | ICD-10-CM | POA: Diagnosis not present

## 2016-02-25 DIAGNOSIS — L03116 Cellulitis of left lower limb: Secondary | ICD-10-CM | POA: Diagnosis not present

## 2016-02-25 DIAGNOSIS — Z87891 Personal history of nicotine dependence: Secondary | ICD-10-CM | POA: Diagnosis not present

## 2016-02-25 DIAGNOSIS — Z7984 Long term (current) use of oral hypoglycemic drugs: Secondary | ICD-10-CM | POA: Diagnosis not present

## 2016-02-26 DIAGNOSIS — L03116 Cellulitis of left lower limb: Secondary | ICD-10-CM | POA: Diagnosis not present

## 2016-02-26 DIAGNOSIS — M170 Bilateral primary osteoarthritis of knee: Secondary | ICD-10-CM | POA: Diagnosis not present

## 2016-02-26 DIAGNOSIS — M10049 Idiopathic gout, unspecified hand: Secondary | ICD-10-CM | POA: Diagnosis not present

## 2016-02-26 DIAGNOSIS — E119 Type 2 diabetes mellitus without complications: Secondary | ICD-10-CM | POA: Diagnosis not present

## 2016-02-26 DIAGNOSIS — Z87891 Personal history of nicotine dependence: Secondary | ICD-10-CM | POA: Diagnosis not present

## 2016-02-26 DIAGNOSIS — Z7984 Long term (current) use of oral hypoglycemic drugs: Secondary | ICD-10-CM | POA: Diagnosis not present

## 2016-02-26 DIAGNOSIS — E785 Hyperlipidemia, unspecified: Secondary | ICD-10-CM | POA: Diagnosis not present

## 2016-02-26 DIAGNOSIS — E669 Obesity, unspecified: Secondary | ICD-10-CM | POA: Diagnosis not present

## 2016-02-26 DIAGNOSIS — I10 Essential (primary) hypertension: Secondary | ICD-10-CM | POA: Diagnosis not present

## 2016-02-27 DIAGNOSIS — Z7984 Long term (current) use of oral hypoglycemic drugs: Secondary | ICD-10-CM | POA: Diagnosis not present

## 2016-02-27 DIAGNOSIS — M170 Bilateral primary osteoarthritis of knee: Secondary | ICD-10-CM | POA: Diagnosis not present

## 2016-02-27 DIAGNOSIS — L03116 Cellulitis of left lower limb: Secondary | ICD-10-CM | POA: Diagnosis not present

## 2016-02-27 DIAGNOSIS — Z87891 Personal history of nicotine dependence: Secondary | ICD-10-CM | POA: Diagnosis not present

## 2016-02-27 DIAGNOSIS — E669 Obesity, unspecified: Secondary | ICD-10-CM | POA: Diagnosis not present

## 2016-02-27 DIAGNOSIS — E119 Type 2 diabetes mellitus without complications: Secondary | ICD-10-CM | POA: Diagnosis not present

## 2016-02-27 DIAGNOSIS — I10 Essential (primary) hypertension: Secondary | ICD-10-CM | POA: Diagnosis not present

## 2016-02-27 DIAGNOSIS — M10049 Idiopathic gout, unspecified hand: Secondary | ICD-10-CM | POA: Diagnosis not present

## 2016-02-27 DIAGNOSIS — E785 Hyperlipidemia, unspecified: Secondary | ICD-10-CM | POA: Diagnosis not present

## 2016-03-01 ENCOUNTER — Ambulatory Visit (INDEPENDENT_AMBULATORY_CARE_PROVIDER_SITE_OTHER): Payer: Medicare HMO | Admitting: Nurse Practitioner

## 2016-03-01 ENCOUNTER — Other Ambulatory Visit: Payer: Self-pay

## 2016-03-01 ENCOUNTER — Encounter: Payer: Self-pay | Admitting: Nurse Practitioner

## 2016-03-01 VITALS — BP 132/82 | HR 85 | Temp 97.9°F | Resp 19 | Ht 67.0 in | Wt >= 6400 oz

## 2016-03-01 DIAGNOSIS — R197 Diarrhea, unspecified: Secondary | ICD-10-CM | POA: Diagnosis not present

## 2016-03-01 DIAGNOSIS — M109 Gout, unspecified: Secondary | ICD-10-CM

## 2016-03-01 DIAGNOSIS — R600 Localized edema: Secondary | ICD-10-CM

## 2016-03-01 DIAGNOSIS — E1100 Type 2 diabetes mellitus with hyperosmolarity without nonketotic hyperglycemic-hyperosmolar coma (NKHHC): Secondary | ICD-10-CM

## 2016-03-01 DIAGNOSIS — R269 Unspecified abnormalities of gait and mobility: Secondary | ICD-10-CM

## 2016-03-01 DIAGNOSIS — M10249 Drug-induced gout, unspecified hand: Secondary | ICD-10-CM

## 2016-03-01 DIAGNOSIS — Z23 Encounter for immunization: Secondary | ICD-10-CM | POA: Diagnosis not present

## 2016-03-01 DIAGNOSIS — E785 Hyperlipidemia, unspecified: Secondary | ICD-10-CM

## 2016-03-01 DIAGNOSIS — M10049 Idiopathic gout, unspecified hand: Secondary | ICD-10-CM | POA: Diagnosis not present

## 2016-03-01 DIAGNOSIS — L03116 Cellulitis of left lower limb: Secondary | ICD-10-CM

## 2016-03-01 LAB — CBC WITH DIFFERENTIAL/PLATELET
BASOS ABS: 0 {cells}/uL (ref 0–200)
BASOS PCT: 0 %
EOS ABS: 296 {cells}/uL (ref 15–500)
EOS PCT: 4 %
HCT: 40.5 % (ref 38.5–50.0)
HEMOGLOBIN: 13.5 g/dL (ref 13.2–17.1)
LYMPHS ABS: 2146 {cells}/uL (ref 850–3900)
Lymphocytes Relative: 29 %
MCH: 30.9 pg (ref 27.0–33.0)
MCHC: 33.3 g/dL (ref 32.0–36.0)
MCV: 92.7 fL (ref 80.0–100.0)
MONO ABS: 444 {cells}/uL (ref 200–950)
MPV: 11.1 fL (ref 7.5–12.5)
Monocytes Relative: 6 %
Neutro Abs: 4514 cells/uL (ref 1500–7800)
Neutrophils Relative %: 61 %
Platelets: 175 10*3/uL (ref 140–400)
RBC: 4.37 MIL/uL (ref 4.20–5.80)
RDW: 14.2 % (ref 11.0–15.0)
WBC: 7.4 10*3/uL (ref 3.8–10.8)

## 2016-03-01 LAB — URIC ACID: URIC ACID, SERUM: 10.3 mg/dL — AB (ref 4.0–8.0)

## 2016-03-01 LAB — COMPLETE METABOLIC PANEL WITH GFR
ALT: 29 U/L (ref 9–46)
AST: 20 U/L (ref 10–35)
Albumin: 3.6 g/dL (ref 3.6–5.1)
Alkaline Phosphatase: 88 U/L (ref 40–115)
BILIRUBIN TOTAL: 0.4 mg/dL (ref 0.2–1.2)
BUN: 20 mg/dL (ref 7–25)
CO2: 26 mmol/L (ref 20–31)
CREATININE: 1.16 mg/dL (ref 0.70–1.33)
Calcium: 9.2 mg/dL (ref 8.6–10.3)
Chloride: 105 mmol/L (ref 98–110)
GFR, Est African American: 81 mL/min (ref >=60)
GFR, Est Non African American: 70 mL/min (ref >=60)
GLUCOSE: 96 mg/dL (ref 65–99)
Potassium: 4.2 mmol/L (ref 3.5–5.3)
SODIUM: 142 mmol/L (ref 135–146)
TOTAL PROTEIN: 6.9 g/dL (ref 6.1–8.1)

## 2016-03-01 LAB — LIPASE: Lipase: 24 U/L (ref 7–60)

## 2016-03-01 LAB — AMYLASE: Amylase: 34 U/L (ref 0–105)

## 2016-03-01 MED ORDER — FUROSEMIDE 20 MG PO TABS: 20.0000 mg | ORAL_TABLET | Freq: Every day | ORAL | 3 refills | Status: DC

## 2016-03-01 MED ORDER — FUROSEMIDE 20 MG PO TABS: 40.0000 mg | ORAL_TABLET | Freq: Every day | ORAL | 3 refills | Status: DC

## 2016-03-01 MED ORDER — POTASSIUM CHLORIDE ER 10 MEQ PO TBCR: 10.0000 meq | EXTENDED_RELEASE_TABLET | Freq: Every day | ORAL | 3 refills | Status: DC

## 2016-03-01 NOTE — Progress Notes (Signed)
Careteam: [name], NP as PCP - General (Nurse Practitioner)  Advanced Directive information Does patient have an advance directive?: Yes;No, Would patient like information on creating an advanced directive?: Yes - Educational materials given  No Known Allergies  Chief Complaint  Patient presents with  . Transitions Of Care  . Other    referral for home health care services     HPI: Patient is a 54 y.o. male seen in the office today for transition of care from nursing facility. He has a medical history of HTN, Type 2 DM, Gout, Morbid obesity and  Hyperlipidemia. He was at [facility] for short term rehabilitation post hospital admission from 01/26/16-02/04/16 with left leg cellulitis with pseudomonas bacteremia. During hospitalization he was treated on IV antibiotic and later switched to oral Cipro on discharged. During his Hospital admission he also received IV lasix and was diuresed 12 liters. He completed a course of 14 days oral Cipro 02/09/2016 while in Rehab. His left leg ulcer is resolved now on compression wrap to left leg with kerlix and ACE changed every 4 days. Pt was discharged from rehab 02/18/16. Being seen today for transition of care. He has been discharged home with PT/OT/ nursing.  Doing well with home health services but needs additional help at home.  Has had increase swelling to lower legs since he has been at home. Unable to use ted hose because he can not apply them and he lives alone Using lasix daily.  Also noted diarrhea   Review of Systems:  Review of Systems  Constitutional: Negative for activity change, appetite change, chills and fever.  HENT: Negative for congestion, rhinorrhea, sinus pressure, sneezing and sore throat.   Eyes: Negative.   Respiratory: Negative for cough, chest tightness, shortness of breath and wheezing.   Cardiovascular: Positive for leg swelling. Negative for chest pain and palpitations.  Gastrointestinal:  Positive for diarrhea. Negative for abdominal distention, abdominal pain, constipation, nausea and vomiting.  Endocrine: Negative.   Genitourinary: Negative for dysuria, flank pain, frequency and urgency.  Musculoskeletal: Positive for gait problem.  Skin: Negative for pallor and rash.       Left leg healed  Neurological: Negative for dizziness, syncope, light-headedness and headaches.  Psychiatric/Behavioral: Negative for confusion and sleep disturbance. The patient is not nervous/anxious.     Past Medical History:  Diagnosis Date  . Arthritis    right fingers, both knees  . Diabetes type 2, uncontrolled ([place])   . Gout of hand   . Hyperlipidemia   . Hypertension   . Morbidly obese [facility])    Past Surgical History:  Procedure Laterality Date  . unremarkable     Social History:   reports that he quit smoking about 34 years ago. He has never used smokeless tobacco. He reports that he does not drink alcohol or use drugs.  Family History  Problem Relation Age of Onset  . Colon cancer Maternal Grandfather 54  . Heart attack Father   . Stomach cancer Maternal Aunt 80    Medications: Patient's Medications  New Prescriptions   No medications on file  Previous Medications   ALLOPURINOL (ZYLOPRIM) 100 MG TABLET    To take 200 mg mid day- take in addition to 300 mg twice daily   ASPIRIN 81 MG EC TABLET    TAKE 1 TABLET (81 MG TOTAL) BY MOUTH DAILY.   COLCHICINE 0.6 MG TABLET    TAKE 1 TABLET (0.6 MG TOTAL) BY MOUTH  2 (TWO) TIMES DAILY.   FUROSEMIDE (LASIX) 20 MG TABLET    Take 20 mg by mouth daily.   LISINOPRIL (PRINIVIL,ZESTRIL) 10 MG TABLET    Take 1 tablet (10 mg total) by mouth daily.   METFORMIN (GLUCOPHAGE) 500 MG TABLET    Take 1 tablet by mouth twice a day with food.   POTASSIUM CHLORIDE (K-DUR) 10 MEQ TABLET    Take 1 tablet (10 mEq total) by mouth daily.   SIMVASTATIN (ZOCOR) 10 MG TABLET    Take 1 tablet (10 mg total) by mouth at bedtime.  Modified Medications   No  medications on file  Discontinued Medications   FUROSEMIDE (LASIX) 20 MG TABLET    Take 1 tablet (20 mg total) by mouth 2 (two) times daily. Use as needed     Physical Exam:  Vitals:   03/01/16 0942  BP: 132/82  Pulse: 85  Resp: 19  Temp: 97.9 F (36.6 C)  TempSrc: Oral  SpO2: 98%  Weight: (!) 464 lb 9.6 oz (210.7 kg)  Height: 5\' 7"  (1.702 m)   Body mass index is 72.77 kg/m.  Physical Exam  Constitutional: He is oriented to person, place, and time. He appears well-developed and well-nourished. No distress.  Morbidly Obese male  HENT:  Mouth/Throat: Oropharynx is clear and moist. No oropharyngeal exudate.  Neck: Normal range of motion. Neck supple.  Cardiovascular: Normal rate, regular rhythm and normal heart sounds.   Pulmonary/Chest: Effort normal and breath sounds normal. He has no wheezes. He has no rales.  Abdominal: Soft. Bowel sounds are normal. He exhibits no distension. There is no tenderness.  Obese abdomen   Musculoskeletal: He exhibits edema (2+ bilaterally). He exhibits no tenderness.  Has arthritis changes to his digits in his hands, tophi in index finger Abnormal gait due to size and joint stiffness  Neurological: He is alert and oriented to person, place, and time.  Skin: Skin is warm and dry. He is not diaphoretic.  Left leg cellulitis resolved and healed skin from wound  Psychiatric: He has a normal mood and affect.    Labs reviewed: Basic Metabolic Panel:  Recent Labs  02/01/16 0400 02/02/16 0445 02/03/16 0410  NA 136 135 136  K 3.7 3.8 3.8  CL 96* 100* 100*  CO2 28 28 29   GLUCOSE 143* 138* 131*  BUN 15 18 20   CREATININE 1.04 1.10 1.16  CALCIUM 9.1 8.9 9.1   Liver Function Tests:  Recent Labs  01/26/16 2325 01/27/16 0304  AST 34 26  ALT 37 29  ALKPHOS 104 86  BILITOT 0.8 0.5  PROT 7.9 6.5  ALBUMIN 3.7 2.9*   No results for input(s): LIPASE, AMYLASE in the last 8760 hours. No results for input(s): AMMONIA in the last 8760  hours. CBC:  Recent Labs  01/26/16 2349 01/27/16 0304 01/28/16 0510 01/31/16 0754  WBC 21.9* 22.0* 10.1 6.2  NEUTROABS 19.5* 19.3* 7.4  --   HGB 16.0 13.7 11.9* 14.1  HCT 47.5 41.0 36.7* 43.0  MCV 95.8 94.7 95.6 95.3  PLT 225 197 163 224   Lipid Panel:  Recent Labs  12/05/15 1226  CHOL 179  HDL 41  LDLCALC 122*  TRIG 82  CHOLHDL 4.4   TSH: No results for input(s): TSH in the last 8760 hours. A1C: Lab Results  Component Value Date   HGBA1C 5.6 12/05/2015     Assessment/Plan 1. Cellulitis of left lower extremity resolved  2. Localized edema 2+ edema, taking lasix 20 mg  daily without good effects. Will increase to 40 mg daily at this time  - potassium chloride (K-DUR) 10 MEQ tablet; Take 1 tablet (10 mEq total) by mouth daily.  Dispense: 30 tablet; Refill: 3 - Basic metabolic panel; Future - furosemide (LASIX) 20 MG tablet; Take 2 tablets (40 mg total) by mouth daily.  Dispense: 30 tablet; Refill: 3  4. Acute gout of hand, unspecified cause, unspecified laterality conts on colchicine and allopurinol  - Uric acid today, possible diarrhea from colchicine   5. Type 2 diabetes mellitus with hyperosmolarity without coma, without long-term current use of insulin (HCC) -cont medication and dietary changes  - Hemoglobin A1c; Future  6. Diarrhea, unspecified type -possible due to c diff vs medication for gout- has been having since rehab and been on multiple antibiotics -will get lab work to evaluate at this time  7. Hyperlipidemia -conts on zocor, last labs were nonfasting, will get fasting lipids prior to next visit   Form filled out for increase assistance Follow up in 2 weeks with lab work prior to visit  Daijon Wenke K. Harle Battiest  [facility] [phone] 8 am - 5 pm) [phone] (after hours)

## 2016-03-01 NOTE — Patient Instructions (Addendum)
To take lasix 40 mg daily with potassium 10 meq daily  Will check stool for infection To use probiotic daily

## 2016-03-02 ENCOUNTER — Other Ambulatory Visit: Payer: Self-pay

## 2016-03-02 DIAGNOSIS — E785 Hyperlipidemia, unspecified: Secondary | ICD-10-CM | POA: Diagnosis not present

## 2016-03-02 DIAGNOSIS — I10 Essential (primary) hypertension: Secondary | ICD-10-CM | POA: Diagnosis not present

## 2016-03-02 DIAGNOSIS — A0472 Enterocolitis due to Clostridium difficile, not specified as recurrent: Secondary | ICD-10-CM

## 2016-03-02 DIAGNOSIS — M170 Bilateral primary osteoarthritis of knee: Secondary | ICD-10-CM | POA: Diagnosis not present

## 2016-03-02 DIAGNOSIS — E119 Type 2 diabetes mellitus without complications: Secondary | ICD-10-CM | POA: Diagnosis not present

## 2016-03-02 DIAGNOSIS — L03116 Cellulitis of left lower limb: Secondary | ICD-10-CM | POA: Diagnosis not present

## 2016-03-02 DIAGNOSIS — Z7984 Long term (current) use of oral hypoglycemic drugs: Secondary | ICD-10-CM | POA: Diagnosis not present

## 2016-03-02 DIAGNOSIS — M1A9XX1 Chronic gout, unspecified, with tophus (tophi): Secondary | ICD-10-CM

## 2016-03-02 DIAGNOSIS — Z87891 Personal history of nicotine dependence: Secondary | ICD-10-CM | POA: Diagnosis not present

## 2016-03-02 DIAGNOSIS — M10049 Idiopathic gout, unspecified hand: Secondary | ICD-10-CM | POA: Diagnosis not present

## 2016-03-02 DIAGNOSIS — E669 Obesity, unspecified: Secondary | ICD-10-CM | POA: Diagnosis not present

## 2016-03-02 DIAGNOSIS — M109 Gout, unspecified: Secondary | ICD-10-CM

## 2016-03-02 LAB — CLOSTRIDIUM DIFFICILE BY PCR: Toxigenic C. Difficile by PCR: DETECTED — CR

## 2016-03-02 MED ORDER — ALLOPURINOL 100 MG PO TABS: ORAL_TABLET | ORAL | 3 refills | Status: DC

## 2016-03-02 MED ORDER — METRONIDAZOLE 500 MG PO TABS: 500.0000 mg | ORAL_TABLET | Freq: Three times a day (TID) | ORAL | 0 refills | Status: DC

## 2016-03-02 NOTE — Telephone Encounter (Signed)
Prescriptions for the following medications were sent electronically to [facility] on [address]:   allopurinol 100 mg tablets, take 300 mg twice daily and an additional 200 mg at mid day. #240 with 3 RF  Flagyl 500mg  tablets, take 1 tablet every 8 hours for 14 days. #42 with no RF

## 2016-03-03 DIAGNOSIS — E785 Hyperlipidemia, unspecified: Secondary | ICD-10-CM | POA: Diagnosis not present

## 2016-03-03 DIAGNOSIS — M170 Bilateral primary osteoarthritis of knee: Secondary | ICD-10-CM | POA: Diagnosis not present

## 2016-03-03 DIAGNOSIS — E119 Type 2 diabetes mellitus without complications: Secondary | ICD-10-CM | POA: Diagnosis not present

## 2016-03-03 DIAGNOSIS — M10049 Idiopathic gout, unspecified hand: Secondary | ICD-10-CM | POA: Diagnosis not present

## 2016-03-03 DIAGNOSIS — Z7984 Long term (current) use of oral hypoglycemic drugs: Secondary | ICD-10-CM | POA: Diagnosis not present

## 2016-03-03 DIAGNOSIS — I10 Essential (primary) hypertension: Secondary | ICD-10-CM | POA: Diagnosis not present

## 2016-03-03 DIAGNOSIS — L03116 Cellulitis of left lower limb: Secondary | ICD-10-CM | POA: Diagnosis not present

## 2016-03-03 DIAGNOSIS — Z87891 Personal history of nicotine dependence: Secondary | ICD-10-CM | POA: Diagnosis not present

## 2016-03-03 DIAGNOSIS — E669 Obesity, unspecified: Secondary | ICD-10-CM | POA: Diagnosis not present

## 2016-03-05 DIAGNOSIS — E785 Hyperlipidemia, unspecified: Secondary | ICD-10-CM | POA: Diagnosis not present

## 2016-03-05 DIAGNOSIS — Z87891 Personal history of nicotine dependence: Secondary | ICD-10-CM | POA: Diagnosis not present

## 2016-03-05 DIAGNOSIS — M10049 Idiopathic gout, unspecified hand: Secondary | ICD-10-CM | POA: Diagnosis not present

## 2016-03-05 DIAGNOSIS — E119 Type 2 diabetes mellitus without complications: Secondary | ICD-10-CM | POA: Diagnosis not present

## 2016-03-05 DIAGNOSIS — M170 Bilateral primary osteoarthritis of knee: Secondary | ICD-10-CM | POA: Diagnosis not present

## 2016-03-05 DIAGNOSIS — L03116 Cellulitis of left lower limb: Secondary | ICD-10-CM | POA: Diagnosis not present

## 2016-03-05 DIAGNOSIS — Z7984 Long term (current) use of oral hypoglycemic drugs: Secondary | ICD-10-CM | POA: Diagnosis not present

## 2016-03-05 DIAGNOSIS — E669 Obesity, unspecified: Secondary | ICD-10-CM | POA: Diagnosis not present

## 2016-03-05 DIAGNOSIS — I10 Essential (primary) hypertension: Secondary | ICD-10-CM | POA: Diagnosis not present

## 2016-03-11 ENCOUNTER — Other Ambulatory Visit: Payer: Medicare HMO

## 2016-03-11 ENCOUNTER — Other Ambulatory Visit: Payer: Self-pay

## 2016-03-11 DIAGNOSIS — E785 Hyperlipidemia, unspecified: Secondary | ICD-10-CM

## 2016-03-11 DIAGNOSIS — M109 Gout, unspecified: Secondary | ICD-10-CM

## 2016-03-11 DIAGNOSIS — M10049 Idiopathic gout, unspecified hand: Secondary | ICD-10-CM | POA: Diagnosis not present

## 2016-03-11 DIAGNOSIS — M10249 Drug-induced gout, unspecified hand: Secondary | ICD-10-CM

## 2016-03-11 DIAGNOSIS — E1100 Type 2 diabetes mellitus with hyperosmolarity without nonketotic hyperglycemic-hyperosmolar coma (NKHHC): Secondary | ICD-10-CM

## 2016-03-11 DIAGNOSIS — R600 Localized edema: Secondary | ICD-10-CM

## 2016-03-11 LAB — LIPID PANEL
CHOLESTEROL: 148 mg/dL (ref 125–200)
HDL: 47 mg/dL (ref >=40)
LDL Cholesterol: 76 mg/dL (ref <130)
Total CHOL/HDL Ratio: 3.1 Ratio (ref <=5.0)
Triglycerides: 125 mg/dL (ref <150)
VLDL: 25 mg/dL (ref <30)

## 2016-03-11 LAB — BASIC METABOLIC PANEL WITH GFR
BUN: 21 mg/dL (ref 7–25)
CO2: 23 mmol/L (ref 20–31)
Calcium: 8.8 mg/dL (ref 8.6–10.3)
Chloride: 106 mmol/L (ref 98–110)
Creat: 1.18 mg/dL (ref 0.70–1.33)
GFR, EST AFRICAN AMERICAN: 80 mL/min (ref >=60)
GFR, EST NON AFRICAN AMERICAN: 69 mL/min (ref >=60)
GLUCOSE: 117 mg/dL — AB (ref 65–99)
POTASSIUM: 4.4 mmol/L (ref 3.5–5.3)
SODIUM: 139 mmol/L (ref 135–146)

## 2016-03-11 LAB — URIC ACID: URIC ACID, SERUM: 8.4 mg/dL — AB (ref 4.0–8.0)

## 2016-03-12 LAB — HEMOGLOBIN A1C
Hgb A1c MFr Bld: 5.6 % (ref <5.7)
MEAN PLASMA GLUCOSE: 114 mg/dL

## 2016-03-15 ENCOUNTER — Telehealth: Payer: Self-pay | Admitting: Nurse Practitioner

## 2016-03-15 ENCOUNTER — Encounter: Payer: Self-pay | Admitting: Nurse Practitioner

## 2016-03-15 ENCOUNTER — Ambulatory Visit (INDEPENDENT_AMBULATORY_CARE_PROVIDER_SITE_OTHER): Payer: Medicare HMO | Admitting: Nurse Practitioner

## 2016-03-15 VITALS — BP 128/82 | HR 92 | Temp 98.2°F | Resp 18 | Ht 67.0 in | Wt >= 6400 oz

## 2016-03-15 DIAGNOSIS — Z23 Encounter for immunization: Secondary | ICD-10-CM | POA: Diagnosis not present

## 2016-03-15 DIAGNOSIS — E1100 Type 2 diabetes mellitus with hyperosmolarity without nonketotic hyperglycemic-hyperosmolar coma (NKHHC): Secondary | ICD-10-CM | POA: Diagnosis not present

## 2016-03-15 DIAGNOSIS — Z0000 Encounter for general adult medical examination without abnormal findings: Secondary | ICD-10-CM | POA: Diagnosis not present

## 2016-03-15 DIAGNOSIS — M10049 Idiopathic gout, unspecified hand: Secondary | ICD-10-CM

## 2016-03-15 DIAGNOSIS — Z1211 Encounter for screening for malignant neoplasm of colon: Secondary | ICD-10-CM

## 2016-03-15 DIAGNOSIS — R600 Localized edema: Secondary | ICD-10-CM

## 2016-03-15 DIAGNOSIS — I10 Essential (primary) hypertension: Secondary | ICD-10-CM | POA: Diagnosis not present

## 2016-03-15 DIAGNOSIS — M109 Gout, unspecified: Secondary | ICD-10-CM

## 2016-03-15 NOTE — Progress Notes (Signed)
Careteam: [name], NP as PCP - General (Nurse Practitioner)  Advanced Directive information Would patient like information on creating an advanced directive?: Yes - Educational materials given (Has paperwork but has not completed it )  No Known Allergies  Chief Complaint  Patient presents with  . Medical Management of Chronic Issues    Complete physical. Labs printed.     HPI: Patient is a 55 y.o. male seen in the office today for extended visit/annual exam. At last visit pt reported green stools, c diff positive, and is currently taking flagyl no adverse effect while on medication.  Also found to have elevated uric acid level, was not taking allopurinol and colchicine as prescribed, he has gotten a refill and now taking. Uric acid level has slightly improved. No recent flares.  Edema- increase lasix to 40 mg daily with potassium daily. Swelling has gone down, legs feel better. Weight is down ~9 lbs from last visit.  Diabetes- hgb a1c at 5.6, trying to eat better, taking metformin 500 mg twice daily  Hyperlipidemia- LDL 76, down from 122 3 months ago- taking zocor 10 mg daily  Edema- improved significantly since on lasix 40 mg daily  Screenings: Colon Cancer- colonoscopy 2013, follow up in 5 years due to polyps- due next year.  Prostate Cancer- no changes in frequency or flow of urine,  Unknown family hx of prostate cancer. Normal PSA in 2016 Depression screening Depression screen [facility] 2/9 03/15/2016 03/01/2016 02/25/2015 11/07/2014 03/28/2014  Decreased Interest 0 0 0 0 0  Down, Depressed, Hopeless 0 0 0 0 0  PHQ - 2 Score 0 0 0 0 0   Falls Fall Risk  03/15/2016 03/01/2016 12/05/2015 08/21/2015 02/25/2015  Falls in the past year? No No No No No   MMSE No flowsheet data found. Vaccines Immunization History  Administered Date(s) Administered  . Influenza Split 08/29/2013  . Influenza,inj,Quad PF,36+ Mos 03/28/2014, 02/25/2015, 03/01/2016  . PPD Test  02/04/2016  . Pneumococcal Conjugate-13 12/25/2013  . Tdap 08/20/2014, 01/27/2016    Smoking status:.never  Alcohol use: does not drink ETOH  Dentist: does not go routinely  Ophthalmologist: eye doctor appt scheduled for May 07, 2016  Exercise regimen: not currently doing exercise due to recent wound on left leg.  Diet: attempting  diabetic diet -- less carbs and sugar, more fruit and vegetables   Functional Status of ADLs: Toileting- independently  Bathing- independently  Dressing-some help   Review of Systems:  Review of Systems  Constitutional: Negative for activity change, appetite change, chills and fever.  HENT: Negative for congestion, rhinorrhea, sinus pressure, sneezing and sore throat.   Eyes: Negative.   Respiratory: Negative for cough, chest tightness, shortness of breath and wheezing.   Cardiovascular: Positive for leg swelling. Negative for chest pain and palpitations.  Gastrointestinal: Positive for diarrhea. Negative for abdominal distention, abdominal pain, constipation, nausea and vomiting.  Endocrine: Negative.   Genitourinary: Negative for dysuria, flank pain, frequency and urgency.  Musculoskeletal: Positive for gait problem.  Skin: Negative for pallor and rash.       Left leg healed  Neurological: Negative for dizziness, syncope, light-headedness and headaches.  Psychiatric/Behavioral: Negative for confusion and sleep disturbance. The patient is not nervous/anxious.     Past Medical History:  Diagnosis Date  . Arthritis    right fingers, both knees  . Diabetes type 2, uncontrolled ([place])   . Gout of hand   . Hyperlipidemia   . Hypertension   .  Morbidly obese [facility])    Past Surgical History:  Procedure Laterality Date  . unremarkable     Social History:   reports that he quit smoking about 34 years ago. He has never used smokeless tobacco. He reports that he does not drink alcohol or use drugs.  Family History  Problem Relation Age of Onset  .  Colon cancer Maternal Grandfather 58  . Heart attack Father   . Stomach cancer Maternal Aunt 80    Medications: Patient's Medications  New Prescriptions   No medications on file  Previous Medications   ALLOPURINOL (ZYLOPRIM) 100 MG TABLET    To take 200 mg mid day- take in addition to 300 mg twice daily   ASPIRIN 81 MG EC TABLET    TAKE 1 TABLET (81 MG TOTAL) BY MOUTH DAILY.   COLCHICINE 0.6 MG TABLET    TAKE 1 TABLET (0.6 MG TOTAL) BY MOUTH 2 (TWO) TIMES DAILY.   FUROSEMIDE (LASIX) 20 MG TABLET    Take 2 tablets (40 mg total) by mouth daily.   LISINOPRIL (PRINIVIL,ZESTRIL) 10 MG TABLET    Take 1 tablet (10 mg total) by mouth daily.   METFORMIN (GLUCOPHAGE) 500 MG TABLET    Take 1 tablet by mouth twice a day with food.   METRONIDAZOLE (FLAGYL) 500 MG TABLET    Take 500 mg by mouth every 8 (eight) hours.   POTASSIUM CHLORIDE (K-DUR) 10 MEQ TABLET    Take 1 tablet (10 mEq total) by mouth daily.   SIMVASTATIN (ZOCOR) 10 MG TABLET    Take 1 tablet (10 mg total) by mouth at bedtime.  Modified Medications   No medications on file  Discontinued Medications   METRONIDAZOLE (FLAGYL) 500 MG TABLET    Take 1 tablet (500 mg total) by mouth every 8 (eight) hours.     Physical Exam:  Vitals:   03/15/16 1052  BP: 128/82  Pulse: 92  Resp: 18  Temp: 98.2 F (36.8 C)  TempSrc: Oral  SpO2: 97%  Weight: (!) 456 lb 6.4 oz (207 kg)  Height: '5\' 7"'  (1.702 m)   Body mass index is 71.48 kg/m.  Physical Exam  Constitutional: He is oriented to person, place, and time. He appears well-developed and well-nourished. No distress.  Morbidly Obese male  HENT:  Head: Normocephalic and atraumatic.  Right Ear: External ear normal.  Left Ear: External ear normal.  Nose: Nose normal.  Mouth/Throat: Oropharynx is clear and moist. No oropharyngeal exudate.  Eyes: Conjunctivae and EOM are normal. Pupils are equal, round, and reactive to light.  Neck: Normal range of motion. Neck supple.  Cardiovascular:  Normal rate, regular rhythm, normal heart sounds and intact distal pulses.   Pulmonary/Chest: Effort normal and breath sounds normal. He has no wheezes. He has no rales.  Abdominal: Soft. Bowel sounds are normal. He exhibits no distension. There is no tenderness.  Obese abdomen   Musculoskeletal: He exhibits edema (2+ bilaterally) and deformity. He exhibits no tenderness.  Has arthritis changes to his digits in his hands, tophi in index finger Abnormal gait due to size and joint stiffness  Neurological: He is alert and oriented to person, place, and time.  Skin: Skin is warm and dry. He is not diaphoretic.  Left leg cellulitis resolved and healed skin from wound  Psychiatric: He has a normal mood and affect.    Labs reviewed: Basic Metabolic Panel:  Recent Labs  02/03/16 0410 03/01/16 1100 03/11/16 0940  NA 136 142 139  K 3.8 4.2 4.4  CL 100* 105 106  CO2 '29 26 23  ' GLUCOSE 131* 96 117*  BUN '20 20 21  ' CREATININE 1.16 1.16 1.18  CALCIUM 9.1 9.2 8.8   Liver Function Tests:  Recent Labs  01/26/16 2325 01/27/16 0304 03/01/16 1100  AST 34 26 20  ALT 37 29 29  ALKPHOS 104 86 88  BILITOT 0.8 0.5 0.4  PROT 7.9 6.5 6.9  ALBUMIN 3.7 2.9* 3.6    Recent Labs  03/01/16 1100  LIPASE 24  AMYLASE 34   No results for input(s): AMMONIA in the last 8760 hours. CBC:  Recent Labs  01/27/16 0304 01/28/16 0510 01/31/16 0754 03/01/16 1100  WBC 22.0* 10.1 6.2 7.4  NEUTROABS 19.3* 7.4  --  4,514  HGB 13.7 11.9* 14.1 13.5  HCT 41.0 36.7* 43.0 40.5  MCV 94.7 95.6 95.3 92.7  PLT 197 163 224 175   Lipid Panel:  Recent Labs  12/05/15 1226 03/11/16 0940  CHOL 179 148  HDL 41 47  LDLCALC 122* 76  TRIG 82 125  CHOLHDL 4.4 3.1   TSH: No results for input(s): TSH in the last 8760 hours. A1C: Lab Results  Component Value Date   HGBA1C 5.6 03/11/2016     Assessment/Plan 1. Annual physical exam Pt doing well, no new issues  The patient was counseled regarding the  appropriate use of alcohol, regular self-examination of the breasts on a monthly basis, prevention of dental and periodontal disease, diet, regular sustained exercise for at least 30 minutes 5 times per week, testicular self-examination on a monthly basis,smoking cessation, tobacco use,  and recommended schedule for GI hemoccult testing, colonoscopy, cholesterol, thyroid and diabetes screening.  2. Essential hypertension -stable, cont current regimen  - EKG 12-Lead  3. Lower leg edema -has improved with increase in lasix, to cont lasix 40 mg with potassium supplement  - BMP with eGFR; Future  4. Special screening for malignant neoplasms, colon polyps removed on previous colonoscopy, to follow up in 5 years, due March 2018 - Ambulatory referral to Gastroenterology  5. Type 2 diabetes mellitus with hyperosmolarity without coma, without long-term current use of insulin (HCC) -A1c at goal, to cont metformin 500 mg BID - Pneumococcal polysaccharide vaccine 23-valent greater than or equal to 2yo subcutaneous/IM  6. Need for vaccination for pneumococcus - Pneumococcal polysaccharide vaccine 23-valent greater than or equal to 2yo subcutaneous/IM  7. Acute gout of hand, unspecified cause, unspecified laterality -uric acid remains slightly elevated, pt now taking medication as prescribed, will follow up uric acid level in 6 weeks   8. Morbid obesity due to excess calories ([place]) -discussed need for weight loss, pt has meet with nutritionist in the past. Has recently been trying to get back into healthy eating. Has not exercised due to leg wound. This has now healed so he is okay to start going back to gym and slowly increasing activity as tolerates.   Follow up in 6 weeks for lab work and 3 month for routine follow up.   Ronnie Hernandez. Harle Battiest  [facility] [phone] 8 am - 5 pm) [phone] (after hours)

## 2016-03-15 NOTE — Patient Instructions (Addendum)
In 6 weeks come for lab test  Follow up in 3 months for routine follow up   Due for colonoscopy in March 2018  30 mins exercise 5 days a week  To get diabetic eye exam  Dentist every 6 months for routine care  DASH Eating Plan DASH stands for "Dietary Approaches to Stop Hypertension." The DASH eating plan is a healthy eating plan that has been shown to reduce high blood pressure (hypertension). Additional health benefits may include reducing the risk of type 2 diabetes mellitus, heart disease, and stroke. The DASH eating plan may also help with weight loss. WHAT DO I NEED TO KNOW ABOUT THE DASH EATING PLAN? For the DASH eating plan, you will follow these general guidelines:  Choose foods with a percent daily value for sodium of less than 5% (as listed on the food label).  Use salt-free seasonings or herbs instead of table salt or sea salt.  Check with your health care provider or pharmacist before using salt substitutes.  Eat lower-sodium products, often labeled as "lower sodium" or "no salt added."  Eat fresh foods.  Eat more vegetables, fruits, and low-fat dairy products.  Choose whole grains. Look for the word "whole" as the first word in the ingredient list.  Choose fish and skinless chicken or [place] more often than red meat. Limit fish, poultry, and meat to 6 oz (170 g) each day.  Limit sweets, desserts, sugars, and sugary drinks.  Choose heart-healthy fats.  Limit cheese to 1 oz (28 g) per day.  Eat more home-cooked food and less restaurant, buffet, and fast food.  Limit fried foods.  Cook foods using methods other than frying.  Limit canned vegetables. If you do use them, rinse them well to decrease the sodium.  When eating at a restaurant, ask that your food be prepared with less salt, or no salt if possible. WHAT FOODS CAN I EAT? Seek help from a dietitian for individual calorie needs. Grains Whole grain or whole wheat bread. Brown rice. Whole grain or  whole wheat pasta. Quinoa, bulgur, and whole grain cereals. Low-sodium cereals. Corn or whole wheat flour tortillas. Whole grain cornbread. Whole grain crackers. Low-sodium crackers. Vegetables Fresh or frozen vegetables (raw, steamed, roasted, or grilled). Low-sodium or reduced-sodium tomato and vegetable juices. Low-sodium or reduced-sodium tomato sauce and paste. Low-sodium or reduced-sodium canned vegetables.  Fruits All fresh, canned (in natural juice), or frozen fruits. Meat and Other Protein Products Ground beef (85% or leaner), grass-fed beef, or beef trimmed of fat. Skinless chicken or [place]. Ground chicken or [place]. Pork trimmed of fat. All fish and seafood. Eggs. Dried beans, peas, or lentils. Unsalted nuts and seeds. Unsalted canned beans. Dairy Low-fat dairy products, such as skim or 1% milk, 2% or reduced-fat cheeses, low-fat ricotta or cottage cheese, or plain low-fat yogurt. Low-sodium or reduced-sodium cheeses. Fats and Oils Tub margarines without trans fats. Light or reduced-fat mayonnaise and salad dressings (reduced sodium). Avocado. Safflower, olive, or canola oils. Natural peanut or almond butter. Other Unsalted popcorn and pretzels. The items listed above may not be a complete list of recommended foods or beverages. Contact your dietitian for more options. WHAT FOODS ARE NOT RECOMMENDED? Grains White bread. White pasta. White rice. Refined cornbread. Bagels and croissants. Crackers that contain trans fat. Vegetables Creamed or fried vegetables. Vegetables in a cheese sauce. Regular canned vegetables. Regular canned tomato sauce and paste. Regular tomato and vegetable juices. Fruits Dried fruits. Canned fruit in light or heavy syrup. Fruit juice.  Meat and Other Protein Products Fatty cuts of meat. Ribs, chicken wings, bacon, sausage, bologna, salami, chitterlings, fatback, hot dogs, bratwurst, and packaged luncheon meats. Salted nuts and seeds. Canned beans with  salt. Dairy Whole or 2% milk, cream, half-and-half, and cream cheese. Whole-fat or sweetened yogurt. Full-fat cheeses or blue cheese. Nondairy creamers and whipped toppings. Processed cheese, cheese spreads, or cheese curds. Condiments Onion and garlic salt, seasoned salt, table salt, and sea salt. Canned and packaged gravies. Worcestershire sauce. Tartar sauce. Barbecue sauce. Teriyaki sauce. Soy sauce, including reduced sodium. Steak sauce. Fish sauce. Oyster sauce. Cocktail sauce. Horseradish. Ketchup and mustard. Meat flavorings and tenderizers. Bouillon cubes. Hot sauce. Tabasco sauce. Marinades. Taco seasonings. Relishes. Fats and Oils Butter, stick margarine, lard, shortening, ghee, and bacon fat. Coconut, palm kernel, or palm oils. Regular salad dressings. Other Pickles and olives. Salted popcorn and pretzels. The items listed above may not be a complete list of foods and beverages to avoid. Contact your dietitian for more information. WHERE CAN I FIND MORE INFORMATION? National Heart, Lung, and Blood Institute: travelstabloid.com   This information is not intended to replace advice given to you by your health care provider. Make sure you discuss any questions you have with your health care provider.   Document Released: 05/27/2011 Document Revised: 06/28/2014 Document Reviewed: 04/11/2013 Elsevier Interactive Patient Education [employer].

## 2016-04-20 ENCOUNTER — Other Ambulatory Visit: Payer: Medicare HMO

## 2016-04-28 ENCOUNTER — Other Ambulatory Visit: Payer: Medicare HMO

## 2016-04-28 DIAGNOSIS — R600 Localized edema: Secondary | ICD-10-CM

## 2016-04-28 LAB — BASIC METABOLIC PANEL WITH GFR
BUN: 13 mg/dL (ref 7–25)
CALCIUM: 8.8 mg/dL (ref 8.6–10.3)
CO2: 28 mmol/L (ref 20–31)
Chloride: 103 mmol/L (ref 98–110)
Creat: 1.01 mg/dL (ref 0.70–1.33)
GFR, EST NON AFRICAN AMERICAN: 83 mL/min (ref >=60)
GLUCOSE: 103 mg/dL — AB (ref 65–99)
POTASSIUM: 4.5 mmol/L (ref 3.5–5.3)
Sodium: 139 mmol/L (ref 135–146)

## 2016-04-29 NOTE — Telephone Encounter (Signed)
Opened in error

## 2016-06-04 ENCOUNTER — Encounter: Payer: Self-pay | Admitting: Internal Medicine

## 2016-06-07 ENCOUNTER — Telehealth: Payer: Self-pay | Admitting: *Deleted

## 2016-06-07 ENCOUNTER — Other Ambulatory Visit: Payer: Self-pay | Admitting: *Deleted

## 2016-06-07 DIAGNOSIS — Z86010 Personal history of colonic polyps: Secondary | ICD-10-CM

## 2016-06-07 NOTE — Telephone Encounter (Signed)
Patient is agreeable to scheduling procedure.  I have scheduled patient for colonoscopy with propofol at [facility] for 08/10/16 @ 8:30 am. He will need to arrive at 7:00 am.  Patient has also been scheduled for a previsit on 07/26/16 @ 3:30 pm.   I have left a voicemail for patient to call back to discuss.

## 2016-06-07 NOTE — Telephone Encounter (Signed)
Patient is due for recall colonoscopy in 07/2016 for history of adenomatous colon polyps and family history of colon cancer in maternal grandfather. He would need to have his procedure completed at [facility] due to BMI>71.   I have left a voicemail for patient to call back.

## 2016-06-09 NOTE — Telephone Encounter (Signed)
Patient has been advised of time/date/location of both previsit and colonoscopy procedure. He has also been advised that the original dates for colonoscopy in [place] and previsit have been cancelled as they are no longer needed.

## 2016-06-17 ENCOUNTER — Ambulatory Visit (INDEPENDENT_AMBULATORY_CARE_PROVIDER_SITE_OTHER): Payer: Medicare HMO | Admitting: Nurse Practitioner

## 2016-06-17 ENCOUNTER — Encounter: Payer: Self-pay | Admitting: Nurse Practitioner

## 2016-06-17 VITALS — BP 128/78 | HR 73 | Temp 98.1°F | Ht 67.0 in | Wt >= 6400 oz

## 2016-06-17 DIAGNOSIS — E785 Hyperlipidemia, unspecified: Secondary | ICD-10-CM

## 2016-06-17 DIAGNOSIS — H60391 Other infective otitis externa, right ear: Secondary | ICD-10-CM

## 2016-06-17 DIAGNOSIS — M1A9XX1 Chronic gout, unspecified, with tophus (tophi): Secondary | ICD-10-CM

## 2016-06-17 DIAGNOSIS — I10 Essential (primary) hypertension: Secondary | ICD-10-CM | POA: Diagnosis not present

## 2016-06-17 DIAGNOSIS — E1169 Type 2 diabetes mellitus with other specified complication: Secondary | ICD-10-CM | POA: Diagnosis not present

## 2016-06-17 DIAGNOSIS — E669 Obesity, unspecified: Secondary | ICD-10-CM

## 2016-06-17 DIAGNOSIS — J209 Acute bronchitis, unspecified: Secondary | ICD-10-CM | POA: Diagnosis not present

## 2016-06-17 MED ORDER — NEOMYCIN-POLYMYXIN-HC 1 % OT SOLN: 3.0000 [drp] | Freq: Four times a day (QID) | OTIC | 0 refills | Status: AC

## 2016-06-17 NOTE — Progress Notes (Signed)
Careteam: [name], NP as PCP - General (Nurse Practitioner)  Advanced Directive information Does Patient Have a Medical Advance Directive?: No, Would patient like information on creating a medical advance directive?: No - Patient declined  No Known Allergies  Chief Complaint  Patient presents with  . Medical Management of Chronic Issues    3 months routine visit  . Cough    productive deep cough  . Ear Pain    right ear pain      HPI: Patient is a 55 y.o. male seen in the office today for a routine office visit.   Patient reports right eat pain that began last night after water got in his ear. He described the pain as a constant throbbing ache. Rates the pain 6/10. Currently has tissue in his ear to keep the air from getting in it as it hurts worse when it does.   Reports he has a cold with watery eyes, stopped up, chest congestion, deep dry cough, runny nose, and a sore throat. Denies fever. Treatments tried include vicks rub to chest, throat lozenges, and tea with honey which are effective at relieving symptoms. He states after he used the vicks last night he started coughing up green sputum, but started feeling better. Symptoms have been present x1 week and are improving. Feeling better overall   Colonoscopy scheduled for February 2018. Plans to schedule eye appt once colonoscopy is done.   Fasting blood sugar readings at home range from 110-120's. Denies numbness or tingling in feet.   Plans to start back in the water aerobics in January 2018.    Review of Systems:  Review of Systems  Constitutional: Negative for activity change, appetite change, chills and fever.  HENT: Positive for congestion (chest), ear pain (right ear pain), sneezing and sore throat. Negative for rhinorrhea, sinus pain and sinus pressure.   Eyes: Negative.   Respiratory: Positive for cough. Negative for chest tightness, shortness of breath and wheezing.     Cardiovascular: Positive for leg swelling. Negative for chest pain and palpitations.  Gastrointestinal: Negative for abdominal distention, abdominal pain, constipation, diarrhea, nausea and vomiting.  Endocrine: Negative.   Genitourinary: Negative for dysuria, flank pain, frequency and urgency.  Skin: Negative for pallor and rash.  Neurological: Negative for dizziness, syncope, light-headedness and headaches.  Psychiatric/Behavioral: Negative for confusion and sleep disturbance. The patient is not nervous/anxious.     Past Medical History:  Diagnosis Date  . Arthritis    right fingers, both knees  . Diabetes type 2, uncontrolled ([place])   . Gout of hand   . Hyperlipidemia   . Hypertension   . Morbidly obese [facility])    Past Surgical History:  Procedure Laterality Date  . unremarkable     Social History:   reports that he quit smoking about 34 years ago. He has never used smokeless tobacco. He reports that he does not drink alcohol or use drugs.  Family History  Problem Relation Age of Onset  . Colon cancer Maternal Grandfather 46  . Heart attack Father   . Stomach cancer Maternal Aunt 80    Medications: Patient's Medications  New Prescriptions   No medications on file  Previous Medications   ALLOPURINOL (ZYLOPRIM) 100 MG TABLET    To take 200 mg mid day- take in addition to 300 mg twice daily   ASPIRIN 81 MG EC TABLET    TAKE 1 TABLET (81 MG TOTAL) BY MOUTH DAILY.  COLCHICINE 0.6 MG TABLET    TAKE 1 TABLET (0.6 MG TOTAL) BY MOUTH 2 (TWO) TIMES DAILY.   FUROSEMIDE (LASIX) 20 MG TABLET    Take 2 tablets (40 mg total) by mouth daily.   LISINOPRIL (PRINIVIL,ZESTRIL) 10 MG TABLET    Take 1 tablet (10 mg total) by mouth daily.   METFORMIN (GLUCOPHAGE) 500 MG TABLET    Take 1 tablet by mouth twice a day with food.   METRONIDAZOLE (FLAGYL) 500 MG TABLET    Take 500 mg by mouth every 8 (eight) hours.   POTASSIUM CHLORIDE (K-DUR) 10 MEQ TABLET    Take 1 tablet (10 mEq total) by mouth  daily.   SIMVASTATIN (ZOCOR) 10 MG TABLET    Take 1 tablet (10 mg total) by mouth at bedtime.  Modified Medications   No medications on file  Discontinued Medications   No medications on file     Physical Exam:  Vitals:   06/17/16 1244  BP: 128/78  Pulse: 73  Temp: 98.1 F (36.7 C)  TempSrc: Oral  SpO2: 97%  Weight: (!) 472 lb (214.1 kg)  Height: '5\' 7"'  (1.702 m)   Body mass index is 73.93 kg/m.  Physical Exam  Constitutional: He is oriented to person, place, and time. He appears well-developed and well-nourished. No distress.  Morbidly Obese male  HENT:  Head: Normocephalic and atraumatic.  Right Ear: Tympanic membrane normal. There is drainage (clear/brown).  Left Ear: Tympanic membrane, external ear and ear canal normal.  Mouth/Throat: No oropharyngeal exudate.  Eyes: Conjunctivae and EOM are normal. Pupils are equal, round, and reactive to light.  Neck: Normal range of motion. Neck supple.  Cardiovascular: Normal rate, regular rhythm and normal heart sounds.   Pulmonary/Chest: Effort normal and breath sounds normal. He has no wheezes. He has no rales.  Abdominal: Soft. Bowel sounds are normal. He exhibits no distension. There is no tenderness.  Obese abdomen   Musculoskeletal: He exhibits edema (2+ bilaterally) and deformity. He exhibits no tenderness.  Has arthritis changes to his digits in his hands, tophi in index finger Abnormal gait due to size and joint stiffness  Neurological: He is alert and oriented to person, place, and time.  Skin: Skin is warm and dry. He is not diaphoretic.  Psychiatric: He has a normal mood and affect. His behavior is normal. Judgment and thought content normal.    Labs reviewed: Basic Metabolic Panel:  Recent Labs  03/01/16 1100 03/11/16 0940 04/28/16 0837  NA 142 139 139  K 4.2 4.4 4.5  CL 105 106 103  CO2 '26 23 28  ' GLUCOSE 96 117* 103*  BUN '20 21 13  ' CREATININE 1.16 1.18 1.01  CALCIUM 9.2 8.8 8.8   Liver Function  Tests:  Recent Labs  01/26/16 2325 01/27/16 0304 03/01/16 1100  AST 34 26 20  ALT 37 29 29  ALKPHOS 104 86 88  BILITOT 0.8 0.5 0.4  PROT 7.9 6.5 6.9  ALBUMIN 3.7 2.9* 3.6    Recent Labs  03/01/16 1100  LIPASE 24  AMYLASE 34   No results for input(s): AMMONIA in the last 8760 hours. CBC:  Recent Labs  01/27/16 0304 01/28/16 0510 01/31/16 0754 03/01/16 1100  WBC 22.0* 10.1 6.2 7.4  NEUTROABS 19.3* 7.4  --  4,514  HGB 13.7 11.9* 14.1 13.5  HCT 41.0 36.7* 43.0 40.5  MCV 94.7 95.6 95.3 92.7  PLT 197 163 224 175   Lipid Panel:  Recent Labs  12/05/15 1226 03/11/16  0940  CHOL 179 148  HDL 41 47  LDLCALC 122* 76  TRIG 82 125  CHOLHDL 4.4 3.1   TSH: No results for input(s): TSH in the last 8760 hours. A1C: Lab Results  Component Value Date   HGBA1C 5.6 03/11/2016     Assessment/Plan 1. Acute bronchitis, unspecified organism -overall improving -may use Mucinex DM 1 tablet twice a day with a full glass of water for cough and congestion. - Increase hydration. - Continue throat lozenges, vicks rub, and hot tea with honey. - Tylenol 650 mg every 6 hours as needed for pain and/or fever. Max 9 tablets in 24 hours.  2. Essential hypertension - Controlled on lisinopril.  - Exercise encouraged. Patient plans to resume water aerobics in January 2018.  - Diet encouraged.  - CMP with eGFR  3. Hyperlipidemia, unspecified hyperlipidemia type - Stable on Simvastatin. LDL 76 on 9/21. - Diet and exercise encouraged.  - CMP with eGFR  4. Morbidly obese (HCC) - Weight gain of 16 lbs since last visit. Has not made lifestyle modifications despite ongoing education.  - Diet and exercise encouraged.   5. Gout with tophi - Patient now taking Allopurinol and Colchicine.  - Uric acid level coming down; will recheck today.  - Uric Acid  6. Infective otitis externa of right ear - drainage from right ear with pain - Instructions given on proper ear drop technique.  -  NEOMYCIN-POLYMYXIN-HYDROCORTISONE (CORTISPORIN) 1 % SOLN otic solution; Place 3 drops into the right ear 4 (four) times daily.  Dispense: 10 mL; Refill: 0  7. Diabetes mellitus type 2 in obese (HCC) - Controlled on Metformin. Last HgbA1c 5.6 on 9/21. - Hemoglobin A1c   Patient to return in ~1 week to follow-up on right ear pain and drainage.   Carlos American. Harle Battiest  [facility] [phone] 8 am - 5 pm) [phone] (after hours)

## 2016-06-17 NOTE — Patient Instructions (Addendum)
Mucinex DM 1 tablet twice a day with a full glass of water for cough and congestion. Increase hydration. Continue throat lozenges, vicks rub, and hot tea with honey.  Tylenol 650 mg every 6 hours as needed for pain and/or fever. Max 9 tablets in 24 hours.  Cortisporin ear drops are to be used four times a day for 7 days.   Go ahead and schedule your eye exam.       Ear Drops, Adult You have been diagnosed with a condition requiring you to put drops of medicine into your outer ear. Follow these instructions at home:  Put drops in the affected ear as instructed. After putting the drops in, you will need to lie down with the affected ear facing up for ten minutes so the drops will remain in the ear canal and run down and fill the canal. Continue using the ear drops for as long as directed by your health care provider.  Prior to getting up, put a cotton ball gently in your ear canal. Leave enough of the cotton ball out so it can be easily removed. Do not attempt to push this down into the canal with a cotton-tipped swab or other instrument.  Do not irrigate or wash out your ears if you have had a perforated eardrum or mastoid surgery, or unless instructed to do so by your health care provider.  Keep appointments with your health care provider as instructed.  Finish all medicine, or use for the length of time prescribed by your health care provider. Continue the drops even if your problem seems to be doing well after a couple days, or continue as instructed. Contact a health care provider if:  You become worse or develop increasing pain.  You notice any unusual drainage from your ear (particularly if the drainage has a bad smell).  You develop hearing difficulties.  You experience a serious form of dizziness in which you feel as if the room is spinning, and you feel nauseated (vertigo).  The outside of your ear becomes red or swollen or both. This may be a sign of an allergic  reaction. This information is not intended to replace advice given to you by your health care provider. Make sure you discuss any questions you have with your health care provider. Document Released: 06/01/2001 Document Revised: 11/13/2015 Document Reviewed: 01/02/2013 Elsevier Interactive Patient Education  2017 [employer].

## 2016-06-18 LAB — COMPLETE METABOLIC PANEL WITH GFR
ALBUMIN: 3.6 g/dL (ref 3.6–5.1)
ALK PHOS: 102 U/L (ref 40–115)
ALT: 18 U/L (ref 9–46)
AST: 16 U/L (ref 10–35)
BUN: 15 mg/dL (ref 7–25)
CALCIUM: 8.8 mg/dL (ref 8.6–10.3)
CO2: 25 mmol/L (ref 20–31)
Chloride: 104 mmol/L (ref 98–110)
Creat: 0.92 mg/dL (ref 0.70–1.33)
GFR, Est Non African American: >89 mL/min (ref >=60)
Glucose, Bld: 93 mg/dL (ref 65–99)
POTASSIUM: 4.2 mmol/L (ref 3.5–5.3)
Sodium: 139 mmol/L (ref 135–146)
Total Bilirubin: 0.6 mg/dL (ref 0.2–1.2)
Total Protein: 7.1 g/dL (ref 6.1–8.1)

## 2016-06-18 LAB — HEMOGLOBIN A1C
Hgb A1c MFr Bld: 5.7 % — ABNORMAL HIGH (ref <5.7)
Mean Plasma Glucose: 117 mg/dL

## 2016-06-18 LAB — URIC ACID: URIC ACID, SERUM: 9.3 mg/dL — AB (ref 4.0–8.0)

## 2016-06-20 ENCOUNTER — Encounter (HOSPITAL_COMMUNITY): Payer: Self-pay | Admitting: Emergency Medicine

## 2016-06-20 ENCOUNTER — Emergency Department (HOSPITAL_COMMUNITY)
Admission: EM | Admit: 2016-06-20 | Discharge: 2016-06-21 | Disposition: A | Discharge status: Home / Self Care | Payer: Medicare HMO | Attending: Emergency Medicine | Admitting: Emergency Medicine

## 2016-06-20 ENCOUNTER — Emergency Department (HOSPITAL_COMMUNITY): Payer: Medicare HMO

## 2016-06-20 DIAGNOSIS — R05 Cough: Secondary | ICD-10-CM | POA: Diagnosis not present

## 2016-06-20 DIAGNOSIS — Z7982 Long term (current) use of aspirin: Secondary | ICD-10-CM | POA: Insufficient documentation

## 2016-06-20 DIAGNOSIS — R0989 Other specified symptoms and signs involving the circulatory and respiratory systems: Secondary | ICD-10-CM | POA: Diagnosis not present

## 2016-06-20 DIAGNOSIS — H6691 Otitis media, unspecified, right ear: Secondary | ICD-10-CM | POA: Diagnosis not present

## 2016-06-20 DIAGNOSIS — J069 Acute upper respiratory infection, unspecified: Secondary | ICD-10-CM | POA: Diagnosis not present

## 2016-06-20 DIAGNOSIS — E119 Type 2 diabetes mellitus without complications: Secondary | ICD-10-CM | POA: Insufficient documentation

## 2016-06-20 DIAGNOSIS — Z87891 Personal history of nicotine dependence: Secondary | ICD-10-CM | POA: Insufficient documentation

## 2016-06-20 DIAGNOSIS — J9801 Acute bronchospasm: Secondary | ICD-10-CM | POA: Diagnosis not present

## 2016-06-20 DIAGNOSIS — I10 Essential (primary) hypertension: Secondary | ICD-10-CM | POA: Diagnosis not present

## 2016-06-20 DIAGNOSIS — H65191 Other acute nonsuppurative otitis media, right ear: Secondary | ICD-10-CM

## 2016-06-20 DIAGNOSIS — Z85038 Personal history of other malignant neoplasm of large intestine: Secondary | ICD-10-CM | POA: Insufficient documentation

## 2016-06-20 DIAGNOSIS — Z7984 Long term (current) use of oral hypoglycemic drugs: Secondary | ICD-10-CM | POA: Diagnosis not present

## 2016-06-20 LAB — CBC
HEMATOCRIT: 38.6 % — AB (ref 39.0–52.0)
Hemoglobin: 13.2 g/dL (ref 13.0–17.0)
MCH: 32.1 pg (ref 26.0–34.0)
MCHC: 34.2 g/dL (ref 30.0–36.0)
MCV: 93.9 fL (ref 78.0–100.0)
Platelets: 217 10*3/uL (ref 150–400)
RBC: 4.11 MIL/uL — ABNORMAL LOW (ref 4.22–5.81)
RDW: 13.2 % (ref 11.5–15.5)
WBC: 15 10*3/uL — ABNORMAL HIGH (ref 4.0–10.5)

## 2016-06-20 LAB — BASIC METABOLIC PANEL
ANION GAP: 10 (ref 5–15)
BUN: 12 mg/dL (ref 6–20)
CALCIUM: 8.7 mg/dL — AB (ref 8.9–10.3)
CO2: 26 mmol/L (ref 22–32)
CREATININE: 1.17 mg/dL (ref 0.61–1.24)
Chloride: 103 mmol/L (ref 101–111)
GFR calc Af Amer: >60 mL/min (ref >60)
GFR calc non Af Amer: >60 mL/min (ref >60)
GLUCOSE: 101 mg/dL — AB (ref 65–99)
Potassium: 4.1 mmol/L (ref 3.5–5.1)
Sodium: 139 mmol/L (ref 135–145)

## 2016-06-20 MED ORDER — BENZONATATE 100 MG PO CAPS
200.0000 mg | ORAL_CAPSULE | Freq: Once | ORAL | Status: AC
  Administered 2016-06-20: 200 mg via ORAL
  Filled 2016-06-20: qty 2

## 2016-06-20 MED ORDER — IPRATROPIUM-ALBUTEROL 0.5-2.5 (3) MG/3ML IN SOLN
3.0000 mL | Freq: Once | RESPIRATORY_TRACT | Status: AC
  Administered 2016-06-21: 3 mL via RESPIRATORY_TRACT
  Filled 2016-06-20: qty 3

## 2016-06-20 MED ORDER — ALBUTEROL SULFATE (2.5 MG/3ML) 0.083% IN NEBU
2.5000 mg | INHALATION_SOLUTION | Freq: Once | RESPIRATORY_TRACT | Status: AC
  Administered 2016-06-21: 2.5 mg via RESPIRATORY_TRACT
  Filled 2016-06-20: qty 3

## 2016-06-20 MED ORDER — ALBUTEROL SULFATE (2.5 MG/3ML) 0.083% IN NEBU
2.5000 mg | INHALATION_SOLUTION | Freq: Once | RESPIRATORY_TRACT | Status: AC
  Administered 2016-06-20: 2.5 mg via RESPIRATORY_TRACT
  Filled 2016-06-20: qty 3

## 2016-06-20 MED ORDER — IPRATROPIUM-ALBUTEROL 0.5-2.5 (3) MG/3ML IN SOLN
3.0000 mL | Freq: Once | RESPIRATORY_TRACT | Status: AC
  Administered 2016-06-20: 3 mL via RESPIRATORY_TRACT
  Filled 2016-06-20: qty 3

## 2016-06-20 MED ORDER — DEXAMETHASONE SODIUM PHOSPHATE 10 MG/ML IJ SOLN
10.0000 mg | Freq: Once | INTRAMUSCULAR | Status: AC
  Administered 2016-06-21: 10 mg via INTRAMUSCULAR
  Filled 2016-06-20: qty 1

## 2016-06-20 NOTE — ED Triage Notes (Signed)
Patient reports persistent productive cough with chest congestion onset this week . Denies fever or chills .

## 2016-06-20 NOTE — ED Provider Notes (Signed)
[facility] DEPT Provider Note   CSN: [id number] Arrival date & time: 06/20/16  2057    History   Chief Complaint Chief Complaint  Patient presents with  . Cough  . Chest Congestion    HPI Ronnie Hernandez is a 55 y.o. male.  55 year old male with a history of type 2 diabetes mellitus, dyslipidemia, and hypertension presents to the emergency department for evaluation of a productive cough. Patient states that he has been coughing up clear phlegm over the past week. His cough has been worsening despite Mucinex DM. He states that he has started to notice a wheeze when he breathes. He has become increasingly short of breath with exertion. He feels as though he has congestion in his chest that he is unable to improve. Patient reporting subjective fever characterized by "feeling hot". He denies chills. No reported vomiting or diarrhea. No known sick contacts.   The history is provided by the patient. No language interpreter was used.  Cough  Associated symptoms include shortness of breath and wheezing.    Past Medical History:  Diagnosis Date  . Arthritis    right fingers, both knees  . Diabetes type 2, uncontrolled ([place])   . Gout of hand   . Hyperlipidemia   . Hypertension   . Morbidly obese [facility])     Patient Active Problem List   Diagnosis Date Noted  . Abnormality of gait 02/18/2016  . Cellulitis of left lower extremity 01/27/2016  . Type 2 diabetes mellitus ([place]) 12/25/2013  . Drug-induced gout 12/25/2013  . Hyperlipidemia 08/01/2013  . Morbidly obese ([place])   . Hypertension   . Diabetes type 2, uncontrolled ([place])   . Gout of hand   . Colon cancer screening 07/27/2011  . Benign neoplasm of colon 07/27/2011    Past Surgical History:  Procedure Laterality Date  . unremarkable         Home Medications    Prior to Admission medications   Medication Sig Start Date End Date Taking? Authorizing Provider  allopurinol (ZYLOPRIM) 100 MG tablet To take 200 mg  mid day- take in addition to 300 mg twice daily 03/02/16   [name], NP  amoxicillin-clavulanate (AUGMENTIN) 875-125 MG tablet Take 1 tablet by mouth every 12 (twelve) hours. 06/21/16   Antonietta Breach, PA-C  aspirin 81 MG EC tablet TAKE 1 TABLET (81 MG TOTAL) BY MOUTH DAILY. 08/20/14   Mahima Bubba Camp, MD  colchicine 0.6 MG tablet TAKE 1 TABLET (0.6 MG TOTAL) BY MOUTH 2 (TWO) TIMES DAILY. 11/25/14   Tiffany L Reed, DO  doxycycline (VIBRAMYCIN) 100 MG capsule Take 1 capsule (100 mg total) by mouth 2 (two) times daily. 06/21/16   Antonietta Breach, PA-C  furosemide (LASIX) 20 MG tablet Take 2 tablets (40 mg total) by mouth daily. 03/01/16   [name], NP  guaiFENesin-codeine 100-10 MG/5ML syrup Take 5 mLs by mouth every 8 (eight) hours as needed for cough. 06/21/16   Antonietta Breach, PA-C  lisinopril (PRINIVIL,ZESTRIL) 10 MG tablet Take 1 tablet (10 mg total) by mouth daily. 11/27/14   Estill Dooms, MD  magic mouthwash w/lidocaine SOLN Take 5 mLs by mouth 4 (four) times daily as needed (sore throat). Gargle and swallow for sore throat, as prescribed 06/21/16   Antonietta Breach, PA-C  metFORMIN (GLUCOPHAGE) 500 MG tablet Take 1 tablet by mouth twice a day with food. 08/18/15   [name], NP  NEOMYCIN-POLYMYXIN-HYDROCORTISONE (CORTISPORIN) 1 % SOLN otic solution Place 3 drops  into the right ear 4 (four) times daily. 06/17/16 06/24/16  [name], NP  potassium chloride (K-DUR) 10 MEQ tablet Take 1 tablet (10 mEq total) by mouth daily. 03/01/16   [name], NP  simvastatin (ZOCOR) 10 MG tablet Take 1 tablet (10 mg total) by mouth at bedtime. 11/07/14   [name], NP    Family History Family History  Problem Relation Age of Onset  . Colon cancer Maternal Grandfather 56  . Heart attack Father   . Stomach cancer Maternal Aunt 80    Social History Social History  Substance Use Topics  . Smoking status: Former Smoker    Quit date: 02/09/1982  . Smokeless tobacco: Never Used  . Alcohol use  No     Allergies   Patient has no known allergies.   Review of Systems Review of Systems  Constitutional: Positive for fever (subjective).  HENT: Positive for congestion.   Respiratory: Positive for cough, chest tightness, shortness of breath and wheezing.   Ten systems reviewed and are negative for acute change, except as noted in the HPI.    Physical Exam Updated Vital Signs BP 122/91   Pulse 117   Temp 99.9 F (37.7 C) (Oral)   Resp 15   Ht 5\' 7"  (1.702 m)   Wt (!) 215.5 kg   SpO2 100%   BMI 74.40 kg/m   Physical Exam  Constitutional: He is oriented to person, place, and time. He appears well-developed and well-nourished. No distress.  Nontoxic and in NAD. Supermorbidly obese.  HENT:  Head: Normocephalic and atraumatic.  Right Ear: External ear and ear canal normal. No drainage. Tympanic membrane is injected, erythematous and retracted.  Left Ear: Tympanic membrane, external ear and ear canal normal.  Eyes: Conjunctivae and EOM are normal. No scleral icterus.  Neck: Normal range of motion.  Cardiovascular: Regular rhythm and intact distal pulses.   Mild tachycardia.  Pulmonary/Chest: Effort normal. No respiratory distress. He has wheezes.  Cough productive of clear phlegm. Expiratory wheezing diffusely. Diffuse decreased breath sounds as well. No rales or rhonchi.  Musculoskeletal: Normal range of motion.  Neurological: He is alert and oriented to person, place, and time. He exhibits normal muscle tone. Coordination normal.  GCS 15. Patient moving all extremities.  Skin: Skin is warm and dry. No rash noted. He is not diaphoretic. No erythema. No pallor.  Psychiatric: He has a normal mood and affect. His behavior is normal.  Nursing note and vitals reviewed.    ED Treatments / Results  Labs (all labs ordered are listed, but only abnormal results are displayed) Labs Reviewed  CBC - Abnormal; Notable for the following:       Result Value   WBC 15.0 (*)    RBC  4.11 (*)    HCT 38.6 (*)    All other components within normal limits  BASIC METABOLIC PANEL - Abnormal; Notable for the following:    Glucose, Bld 101 (*)    Calcium 8.7 (*)    All other components within normal limits    EKG  EKG Interpretation None       Radiology Dg Chest 2 View  Result Date: 06/20/2016 CLINICAL DATA:  Cough and chest congestion over the last 3 days. Fluid in the lower extremities. EXAM: CHEST  2 VIEW COMPARISON:  01/26/2016 FINDINGS: The heart size and mediastinal contours are within normal limits. Both lungs are clear. The visualized skeletal structures are unremarkable. IMPRESSION: No active cardiopulmonary disease. Electronically  Signed   By: Nelson Chimes M.D.   On: 06/20/2016 21:49    Procedures Procedures (including critical care time)  Medications Ordered in ED Medications  acetaminophen (TYLENOL) tablet 1,000 mg (not administered)  gi cocktail (Maalox,Lidocaine,Donnatal) (not administered)  albuterol (PROVENTIL HFA;VENTOLIN HFA) 108 (90 Base) MCG/ACT inhaler 2 puff (not administered)  aerochamber plus with mask device 1 each (not administered)  ipratropium-albuterol (DUONEB) 0.5-2.5 (3) MG/3ML nebulizer solution 3 mL (3 mLs Nebulization Given 06/20/16 2234)  albuterol (PROVENTIL) (2.5 MG/3ML) 0.083% nebulizer solution 2.5 mg (2.5 mg Nebulization Given 06/20/16 2234)  benzonatate (TESSALON) capsule 200 mg (200 mg Oral Given 06/20/16 2234)  ipratropium-albuterol (DUONEB) 0.5-2.5 (3) MG/3ML nebulizer solution 3 mL (3 mLs Nebulization Given 06/21/16 0010)  albuterol (PROVENTIL) (2.5 MG/3ML) 0.083% nebulizer solution 2.5 mg (2.5 mg Nebulization Given 06/21/16 0010)  dexamethasone (DECADRON) injection 10 mg (10 mg Intramuscular Given 06/21/16 0011)    1:10 AM Patient ambulatory without hypoxia. He states that the breathing treatments have helped and improved his SOB.   Initial Impression / Assessment and Plan / ED Course  I have reviewed the triage vital  signs and the nursing notes.  Pertinent labs & imaging results that were available during my care of the patient were reviewed by me and considered in my medical decision making (see chart for details).  Clinical Course     Patient presenting for cough, SOB, and wheezing. He saw his PCP on 06/17/16 and was diagnosed with otitis externa and URI. CXR negative for acute infiltrate. No fever. Wheezing heard diffusely on initial evaluation. DuoNeb and Decadron ordered for management.  Patient with significant decrease in sputum production with use of DuoNeb and Tessalon. Patient states that he is breathing much better following breathing treatments. He is ambulatory in the emergency department without hypoxia. Giving comorbidities, will cover for early pneumonia with antibiotics. This will also adequately cover for otitis media. Instructions given for supportive care for further outpatient management. Patient comfortable with discharge. Return precautions discussed and provided. Patient discharged in satisfactory condition with no unaddressed concerns.   Final Clinical Impressions(s) / ED Diagnoses   Final diagnoses:  Acute upper respiratory infection  Bronchospasm, acute  Otitis media, acute nonsuppurative, right    New Prescriptions New Prescriptions   AMOXICILLIN-CLAVULANATE (AUGMENTIN) 875-125 MG TABLET    Take 1 tablet by mouth every 12 (twelve) hours.   DOXYCYCLINE (VIBRAMYCIN) 100 MG CAPSULE    Take 1 capsule (100 mg total) by mouth 2 (two) times daily.   GUAIFENESIN-CODEINE 100-10 MG/5ML SYRUP    Take 5 mLs by mouth every 8 (eight) hours as needed for cough.   MAGIC MOUTHWASH W/LIDOCAINE SOLN    Take 5 mLs by mouth 4 (four) times daily as needed (sore throat). Gargle and swallow for sore throat, as prescribed     Antonietta Breach, PA-C 06/21/16 0225    Davonna Belling, MD 06/22/16 0005

## 2016-06-21 MED ORDER — AEROCHAMBER PLUS W/MASK MISC
1.0000 | Freq: Once | Status: AC
  Administered 2016-06-21: 1
  Filled 2016-06-21: qty 1

## 2016-06-21 MED ORDER — GI COCKTAIL ~~LOC~~
30.0000 mL | Freq: Once | ORAL | Status: AC
  Administered 2016-06-21: 30 mL via ORAL
  Filled 2016-06-21: qty 30

## 2016-06-21 MED ORDER — AMOXICILLIN-POT CLAVULANATE 875-125 MG PO TABS: 1.0000 | ORAL_TABLET | Freq: Two times a day (BID) | ORAL | 0 refills | Status: DC

## 2016-06-21 MED ORDER — ALBUTEROL SULFATE HFA 108 (90 BASE) MCG/ACT IN AERS
2.0000 | INHALATION_SPRAY | Freq: Once | RESPIRATORY_TRACT | Status: AC
  Administered 2016-06-21: 2 via RESPIRATORY_TRACT
  Filled 2016-06-21: qty 6.7

## 2016-06-21 MED ORDER — GUAIFENESIN-CODEINE 100-10 MG/5ML PO SOLN: 5.0000 mL | Freq: Three times a day (TID) | ORAL | 0 refills | Status: DC | PRN

## 2016-06-21 MED ORDER — DOXYCYCLINE HYCLATE 100 MG PO CAPS: 100.0000 mg | ORAL_CAPSULE | Freq: Two times a day (BID) | ORAL | 0 refills | Status: DC

## 2016-06-21 MED ORDER — ACETAMINOPHEN 500 MG PO TABS
1000.0000 mg | ORAL_TABLET | Freq: Once | ORAL | Status: AC
  Administered 2016-06-21: 1000 mg via ORAL
  Filled 2016-06-21: qty 2

## 2016-06-21 MED ORDER — MAGIC MOUTHWASH W/LIDOCAINE: 5.0000 mL | Freq: Four times a day (QID) | ORAL | 0 refills | Status: DC | PRN

## 2016-06-21 NOTE — Discharge Instructions (Signed)
Used 2 puffs of an albuterol inhaler every 4-6 hours as needed for cough, shortness of breath, and wheezing. We advise that you take Augmentin and doxycycline as prescribed for coverage of pneumonia. This should also help your ear infection. Use guaifenesin/codeine for cough. Do not drive after taking this medication as it may make you drowsy. You have been prescribed Magic mouthwash for sore throat. Follow up with your doctor to ensure resolution of symptoms.

## 2016-06-21 NOTE — ED Notes (Signed)
Pulse ox 100% while ambulating in hallway with EMT

## 2016-06-29 ENCOUNTER — Ambulatory Visit (INDEPENDENT_AMBULATORY_CARE_PROVIDER_SITE_OTHER): Payer: Medicare HMO | Admitting: Nurse Practitioner

## 2016-06-29 ENCOUNTER — Encounter: Payer: Self-pay | Admitting: Nurse Practitioner

## 2016-06-29 VITALS — BP 126/72 | HR 86 | Temp 97.9°F | Resp 18 | Ht 67.0 in | Wt >= 6400 oz

## 2016-06-29 DIAGNOSIS — H60391 Other infective otitis externa, right ear: Secondary | ICD-10-CM

## 2016-06-29 DIAGNOSIS — M1A9XX1 Chronic gout, unspecified, with tophus (tophi): Secondary | ICD-10-CM

## 2016-06-29 DIAGNOSIS — J069 Acute upper respiratory infection, unspecified: Secondary | ICD-10-CM

## 2016-06-29 MED ORDER — FEBUXOSTAT 80 MG PO TABS: 80.0000 mg | ORAL_TABLET | Freq: Every day | ORAL | 3 refills | Status: DC

## 2016-06-29 NOTE — Patient Instructions (Addendum)
Complete all antibiotics  Stop allopurinol  Start Uloric 40 mg daily for gout  Follow up uric acid level in 1 month (lab appt only)  Routine follow up with Ronnie Hernandez in 3 months  Low-Purine Diet Purines are compounds that affect the level of uric acid in your body. A low-purine diet is a diet that is low in purines. Eating a low-purine diet can prevent the level of uric acid in your body from getting too high and causing gout or kidney stones or both. What do I need to know about this diet?  Choose low-purine foods. Examples of low-purine foods are listed in the next section.  Drink plenty of fluids, especially water. Fluids can help remove uric acid from your body. Try to drink 8-16 cups (1.9-3.8 L) a day.  Limit foods high in fat, especially saturated fat, as fat makes it harder for the body to get rid of uric acid. Foods high in saturated fat include pizza, cheese, ice cream, whole milk, fried foods, and gravies. Choose foods that are lower in fat and lean sources of protein. Use olive oil when cooking as it contains healthy fats that are not high in saturated fat.  Limit alcohol. Alcohol interferes with the elimination of uric acid from your body. If you are having a gout attack, avoid all alcohol.  Keep in mind that different people's bodies react differently to different foods. You will probably learn over time which foods do or do not affect you. If you discover that a food tends to cause your gout to flare up, avoid eating that food. You can more freely enjoy foods that do not cause problems. If you have any questions about a food item, talk to your dietitian or health care provider. Which foods are low, moderate, and high in purines? The following is a list of foods that are low, moderate, and high in purines. You can eat any amount of the foods that are low in purines. You may be able to have small amounts of foods that are moderate in purines. Ask your health care provider how much of  a food moderate in purines you can have. Avoid foods high in purines. Grains  Foods low in purines: Enriched white bread, pasta, rice, cake, cornbread, popcorn.  Foods moderate in purines: Whole-grain breads and cereals, wheat germ, bran, oatmeal. Uncooked oatmeal. Dry wheat bran or wheat germ.  Foods high in purines: Pancakes, [place] toast, biscuits, muffins. Vegetables  Foods low in purines: All vegetables, except those that are moderate in purines.  Foods moderate in purines: Asparagus, cauliflower, spinach, mushrooms, green peas. Fruits  All fruits are low in purines. Meats and other Protein Foods  Foods low in purines: Eggs, nuts, peanut butter.  Foods moderate in purines: 80-90% lean beef, lamb, veal, pork, poultry, fish, eggs, peanut butter, nuts. Crab, lobster, oysters, and shrimp. Cooked dried beans, peas, and lentils.  Foods high in purines: Anchovies, sardines, herring, mussels, tuna, codfish, scallops, trout, and haddock. Berniece Salines. Organ meats (such as liver or kidney). Tripe. Game meat. Goose. Sweetbreads. Dairy  All dairy foods are low in purines. Low-fat and fat-free dairy products are best because they are low in saturated fat. Beverages  Drinks low in purines: Water, carbonated beverages, tea, coffee, cocoa.  Drinks moderate in purines: Soft drinks and other drinks sweetened with high-fructose corn syrup. Juices. To find whether a food or drink is sweetened with high-fructose corn syrup, look at the ingredients list.  Drinks high in purines: Alcoholic beverages (  such as beer). Condiments  Foods low in purines: Salt, herbs, olives, pickles, relishes, vinegar.  Foods moderate in purines: Butter, margarine, oils, mayonnaise. Fats and Oils  Foods low in purines: All types, except gravies and sauces made with meat.  Foods high in purines: Gravies and sauces made with meat. Other Foods  Foods low in purines: Sugars, sweets, gelatin. Cake. Soups made without  meat.  Foods moderate in purines: Meat-based or fish-based soups, broths, or bouillons. Foods and drinks sweetened with high-fructose corn syrup.  Foods high in purines: High-fat desserts (such as ice cream, cookies, cakes, pies, doughnuts, and chocolate). Contact your dietitian for more information on foods that are not listed here.  This information is not intended to replace advice given to you by your health care provider. Make sure you discuss any questions you have with your health care provider. Document Released: 10/02/2010 Document Revised: 11/13/2015 Document Reviewed: 05/14/2013 Elsevier Interactive Patient Education  2017 [employer].

## 2016-06-29 NOTE — Progress Notes (Signed)
Careteam: [name], NP as PCP - General (Nurse Practitioner)  Advanced Directive information Does Patient Have a Medical Advance Directive?: No  No Known Allergies  Chief Complaint  Patient presents with  . Medical Management of Chronic Issues    1 week follow up on chest congestion/ear. Pt went to ED 8 days ago; given 2 breathing treatments, cough med and inhaler.     HPI: Patient is a 56 y.o. male seen in the office today for 1 week follow up and to follow-up on his ED visit on 12/31. Pt reports he was doing well until friend started cleaning out his stove then starting having increase cough and congestion. Went to ED due to increased congestion and could hardly swallow - was given duoneb x2, an albuterol inhaler, Tessalon, and decadron in the Ed. In addition, the ED prescribed Augmentin, Doxycycline, Guaifenesin-Codeine, and Magic Mouthwash.  Congestion has resolved. Reports if he coughs it is just a normal cough. Throat no longer sore. Still has some antibiotics left but is taking them as prescribed.   Denies any pain or drainage in the right ear that was previously diagnosed with otitis externa.   Review of Systems:  Review of Systems  Constitutional: Negative for activity change, appetite change, chills and fever.  HENT: Negative for congestion, ear pain, rhinorrhea, sinus pain, sinus pressure, sneezing and sore throat.        Tender nasal mucosa  Eyes: Negative.   Respiratory: Negative for cough, chest tightness, shortness of breath and wheezing.   Cardiovascular: Positive for leg swelling. Negative for chest pain and palpitations.  Gastrointestinal: Negative for abdominal distention, abdominal pain, constipation, diarrhea, nausea and vomiting.  Endocrine: Negative.   Genitourinary: Negative for dysuria, flank pain, frequency and urgency.  Skin: Negative for pallor and rash.  Neurological: Negative for dizziness, syncope, light-headedness and  headaches.  Psychiatric/Behavioral: Negative for confusion and sleep disturbance. The patient is not nervous/anxious.     Past Medical History:  Diagnosis Date  . Arthritis    right fingers, both knees  . Diabetes type 2, uncontrolled ([place])   . Gout of hand   . Hyperlipidemia   . Hypertension   . Morbidly obese [facility])    Past Surgical History:  Procedure Laterality Date  . unremarkable     Social History:   reports that he quit smoking about 34 years ago. He has never used smokeless tobacco. He reports that he does not drink alcohol or use drugs.  Family History  Problem Relation Age of Onset  . Colon cancer Maternal Grandfather 44  . Heart attack Father   . Stomach cancer Maternal Aunt 80    Medications: Patient's Medications  New Prescriptions   No medications on file  Previous Medications   ALLOPURINOL (ZYLOPRIM) 100 MG TABLET    To take 200 mg mid day- take in addition to 300 mg twice daily   AMOXICILLIN-CLAVULANATE (AUGMENTIN) 875-125 MG TABLET    Take 1 tablet by mouth every 12 (twelve) hours.   ASPIRIN 81 MG EC TABLET    TAKE 1 TABLET (81 MG TOTAL) BY MOUTH DAILY.   COLCHICINE 0.6 MG TABLET    TAKE 1 TABLET (0.6 MG TOTAL) BY MOUTH 2 (TWO) TIMES DAILY.   DOXYCYCLINE (VIBRAMYCIN) 100 MG CAPSULE    Take 1 capsule (100 mg total) by mouth 2 (two) times daily.   FUROSEMIDE (LASIX) 20 MG TABLET    Take 2 tablets (40 mg total) by mouth  daily.   GUAIFENESIN-CODEINE 100-10 MG/5ML SYRUP    Take 5 mLs by mouth every 8 (eight) hours as needed for cough.   LISINOPRIL (PRINIVIL,ZESTRIL) 10 MG TABLET    Take 1 tablet (10 mg total) by mouth daily.   MAGIC MOUTHWASH W/LIDOCAINE SOLN    Take 5 mLs by mouth 4 (four) times daily as needed (sore throat). Gargle and swallow for sore throat, as prescribed   METFORMIN (GLUCOPHAGE) 500 MG TABLET    Take 1 tablet by mouth twice a day with food.   POTASSIUM CHLORIDE (K-DUR) 10 MEQ TABLET    Take 1 tablet (10 mEq total) by mouth daily.    SIMVASTATIN (ZOCOR) 10 MG TABLET    Take 1 tablet (10 mg total) by mouth at bedtime.  Modified Medications   No medications on file  Discontinued Medications   No medications on file     Physical Exam:  Vitals:   06/29/16 1019  BP: 126/72  Pulse: 86  Resp: 18  Temp: 97.9 F (36.6 C)  TempSrc: Oral  SpO2: 97%  Weight: (!) 470 lb 12.8 oz (213.6 kg)  Height: 5\' 7"  (1.702 m)   Body mass index is 73.74 kg/m.  Physical Exam  Constitutional: He is oriented to person, place, and time. He appears well-developed and well-nourished. No distress.  Morbidly Obese male  HENT:  Head: Normocephalic and atraumatic.  Right Ear: Tympanic membrane normal.  Left Ear: Tympanic membrane, external ear and ear canal normal.  Mouth/Throat: No oropharyngeal exudate.  Eyes: Conjunctivae and EOM are normal. Pupils are equal, round, and reactive to light.  Neck: Normal range of motion. Neck supple.  Cardiovascular: Normal rate, regular rhythm and normal heart sounds.   Pulmonary/Chest: Effort normal and breath sounds normal. He has no wheezes. He has no rales.  Abdominal: Soft. Bowel sounds are normal. He exhibits no distension. There is no tenderness.  Obese abdomen   Musculoskeletal: He exhibits edema (2+ bilaterally) and deformity. He exhibits no tenderness.  Has arthritis changes to his digits in his hands, tophi in index finger Abnormal gait due to size and joint stiffness  Neurological: He is alert and oriented to person, place, and time.  Skin: Skin is warm and dry. He is not diaphoretic.  Psychiatric: He has a normal mood and affect. His behavior is normal. Judgment and thought content normal.    Labs reviewed: Basic Metabolic Panel:  Recent Labs  04/28/16 0837 06/17/16 1425 06/20/16 2306  NA 139 139 139  K 4.5 4.2 4.1  CL 103 104 103  CO2 28 25 26   GLUCOSE 103* 93 101*  BUN 13 15 12   CREATININE 1.01 0.92 1.17  CALCIUM 8.8 8.8 8.7*   Liver Function Tests:  Recent Labs   01/27/16 0304 03/01/16 1100 06/17/16 1425  AST 26 20 16   ALT 29 29 18   ALKPHOS 86 88 102  BILITOT 0.5 0.4 0.6  PROT 6.5 6.9 7.1  ALBUMIN 2.9* 3.6 3.6    Recent Labs  03/01/16 1100  LIPASE 24  AMYLASE 34   No results for input(s): AMMONIA in the last 8760 hours. CBC:  Recent Labs  01/27/16 0304 01/28/16 0510 01/31/16 0754 03/01/16 1100 06/20/16 2306  WBC 22.0* 10.1 6.2 7.4 15.0*  NEUTROABS 19.3* 7.4  --  4,514  --   HGB 13.7 11.9* 14.1 13.5 13.2  HCT 41.0 36.7* 43.0 40.5 38.6*  MCV 94.7 95.6 95.3 92.7 93.9  PLT 197 163 224 175 217   Lipid Panel:  Recent Labs  12/05/15 1226 03/11/16 0940  CHOL 179 148  HDL 41 47  LDLCALC 122* 76  TRIG 82 125  CHOLHDL 4.4 3.1   TSH: No results for input(s): TSH in the last 8760 hours. A1C: Lab Results  Component Value Date   HGBA1C 5.7 (H) 06/17/2016     Assessment/Plan 1. Acute upper respiratory infection - Improved. Continue Augmentin and Doxycycline until complete. - Use ocean nasal spray throughout the day as needed for tender nasal mucosa.   2. Infective otitis externa of right ear - Resolved.  3. Gout with tophi - uric acid level high, reports compliance with allopurinol.  -Stop taking Allopurinol and start taking Uloric. - Education provided on low-purine diet.  - Febuxostat 80 MG TABS; Take 1 tablet (80 mg total) by mouth daily.  Dispense: 30 tablet; Refill: 3 - Uric acid; Future   Return in 4 weeks for labs.  Routine follow-up in 3 months, sooner if needed   Simmie Garin K. Harle Battiest  [facility] [phone] 8 am - 5 pm) [phone] (after hours)

## 2016-07-06 ENCOUNTER — Emergency Department (HOSPITAL_COMMUNITY): Payer: Medicare HMO

## 2016-07-06 ENCOUNTER — Inpatient Hospital Stay (HOSPITAL_COMMUNITY)
Admission: EM | Admit: 2016-07-06 | Discharge: 2016-07-09 | DRG: 553 | Disposition: A | Discharge status: Skilled Nursing Facility | Payer: Medicare HMO | Attending: Internal Medicine | Admitting: Internal Medicine

## 2016-07-06 ENCOUNTER — Encounter (HOSPITAL_COMMUNITY): Payer: Self-pay | Admitting: Internal Medicine

## 2016-07-06 DIAGNOSIS — M1029 Drug-induced gout, multiple sites: Secondary | ICD-10-CM | POA: Diagnosis not present

## 2016-07-06 DIAGNOSIS — M79672 Pain in left foot: Secondary | ICD-10-CM | POA: Diagnosis not present

## 2016-07-06 DIAGNOSIS — Z87891 Personal history of nicotine dependence: Secondary | ICD-10-CM

## 2016-07-06 DIAGNOSIS — I10 Essential (primary) hypertension: Secondary | ICD-10-CM | POA: Diagnosis not present

## 2016-07-06 DIAGNOSIS — R7302 Impaired glucose tolerance (oral): Secondary | ICD-10-CM

## 2016-07-06 DIAGNOSIS — E785 Hyperlipidemia, unspecified: Secondary | ICD-10-CM | POA: Diagnosis not present

## 2016-07-06 DIAGNOSIS — L0390 Cellulitis, unspecified: Secondary | ICD-10-CM

## 2016-07-06 DIAGNOSIS — E119 Type 2 diabetes mellitus without complications: Secondary | ICD-10-CM

## 2016-07-06 DIAGNOSIS — T50905A Adverse effect of unspecified drugs, medicaments and biological substances, initial encounter: Secondary | ICD-10-CM

## 2016-07-06 DIAGNOSIS — E1165 Type 2 diabetes mellitus with hyperglycemia: Secondary | ICD-10-CM

## 2016-07-06 DIAGNOSIS — M79671 Pain in right foot: Secondary | ICD-10-CM | POA: Diagnosis not present

## 2016-07-06 DIAGNOSIS — M79673 Pain in unspecified foot: Secondary | ICD-10-CM | POA: Diagnosis not present

## 2016-07-06 DIAGNOSIS — T50995A Adverse effect of other drugs, medicaments and biological substances, initial encounter: Secondary | ICD-10-CM | POA: Diagnosis present

## 2016-07-06 DIAGNOSIS — D72829 Elevated white blood cell count, unspecified: Secondary | ICD-10-CM | POA: Diagnosis present

## 2016-07-06 DIAGNOSIS — M109 Gout, unspecified: Secondary | ICD-10-CM | POA: Diagnosis not present

## 2016-07-06 DIAGNOSIS — R739 Hyperglycemia, unspecified: Secondary | ICD-10-CM

## 2016-07-06 DIAGNOSIS — Z6841 Body Mass Index (BMI) 40.0 and over, adult: Secondary | ICD-10-CM

## 2016-07-06 DIAGNOSIS — Z8249 Family history of ischemic heart disease and other diseases of the circulatory system: Secondary | ICD-10-CM

## 2016-07-06 DIAGNOSIS — M1009 Idiopathic gout, multiple sites: Secondary | ICD-10-CM | POA: Diagnosis not present

## 2016-07-06 DIAGNOSIS — E1100 Type 2 diabetes mellitus with hyperosmolarity without nonketotic hyperglycemic-hyperosmolar coma (NKHHC): Secondary | ICD-10-CM | POA: Diagnosis not present

## 2016-07-06 DIAGNOSIS — Z79899 Other long term (current) drug therapy: Secondary | ICD-10-CM

## 2016-07-06 DIAGNOSIS — L03116 Cellulitis of left lower limb: Secondary | ICD-10-CM | POA: Diagnosis present

## 2016-07-06 DIAGNOSIS — R262 Difficulty in walking, not elsewhere classified: Secondary | ICD-10-CM | POA: Diagnosis present

## 2016-07-06 DIAGNOSIS — L03115 Cellulitis of right lower limb: Secondary | ICD-10-CM | POA: Diagnosis not present

## 2016-07-06 LAB — CBC
HEMATOCRIT: 35.7 % — AB (ref 39.0–52.0)
Hemoglobin: 11.7 g/dL — ABNORMAL LOW (ref 13.0–17.0)
MCH: 30.5 pg (ref 26.0–34.0)
MCHC: 32.8 g/dL (ref 30.0–36.0)
MCV: 93 fL (ref 78.0–100.0)
Platelets: 220 10*3/uL (ref 150–400)
RBC: 3.84 MIL/uL — ABNORMAL LOW (ref 4.22–5.81)
RDW: 13.8 % (ref 11.5–15.5)
WBC: 18.2 10*3/uL — ABNORMAL HIGH (ref 4.0–10.5)

## 2016-07-06 LAB — BASIC METABOLIC PANEL
Anion gap: 10 (ref 5–15)
BUN: 18 mg/dL (ref 6–20)
CO2: 25 mmol/L (ref 22–32)
Calcium: 8.3 mg/dL — ABNORMAL LOW (ref 8.9–10.3)
Chloride: 95 mmol/L — ABNORMAL LOW (ref 101–111)
Creatinine, Ser: 1.05 mg/dL (ref 0.61–1.24)
GFR calc non Af Amer: >60 mL/min (ref >60)
Glucose, Bld: 156 mg/dL — ABNORMAL HIGH (ref 65–99)
POTASSIUM: 4.6 mmol/L (ref 3.5–5.1)
SODIUM: 130 mmol/L — AB (ref 135–145)

## 2016-07-06 LAB — URINALYSIS, ROUTINE W REFLEX MICROSCOPIC
Bilirubin Urine: NEGATIVE
Glucose, UA: NEGATIVE mg/dL
Hgb urine dipstick: NEGATIVE
Ketones, ur: NEGATIVE mg/dL
Leukocytes, UA: NEGATIVE
NITRITE: NEGATIVE
Protein, ur: NEGATIVE mg/dL
Specific Gravity, Urine: 1.017 (ref 1.005–1.030)
pH: 5 (ref 5.0–8.0)

## 2016-07-06 LAB — CREATININE, SERUM
Creatinine, Ser: 0.99 mg/dL (ref 0.61–1.24)
GFR calc Af Amer: >60 mL/min (ref >60)
GFR calc non Af Amer: >60 mL/min (ref >60)

## 2016-07-06 LAB — CBC WITH DIFFERENTIAL/PLATELET
Basophils Absolute: 0 10*3/uL (ref 0.0–0.1)
Basophils Relative: 0 %
EOS ABS: 0 10*3/uL (ref 0.0–0.7)
Eosinophils Relative: 0 %
HEMATOCRIT: 36.7 % — AB (ref 39.0–52.0)
HEMOGLOBIN: 12.5 g/dL — AB (ref 13.0–17.0)
LYMPHS ABS: 1.2 10*3/uL (ref 0.7–4.0)
Lymphocytes Relative: 6 %
MCH: 31.8 pg (ref 26.0–34.0)
MCHC: 34.1 g/dL (ref 30.0–36.0)
MCV: 93.4 fL (ref 78.0–100.0)
Monocytes Absolute: 2 10*3/uL — ABNORMAL HIGH (ref 0.1–1.0)
Monocytes Relative: 9 %
NEUTROS ABS: 19.3 10*3/uL — AB (ref 1.7–7.7)
NEUTROS PCT: 85 %
Platelets: 217 10*3/uL (ref 150–400)
RBC: 3.93 MIL/uL — AB (ref 4.22–5.81)
RDW: 13.9 % (ref 11.5–15.5)
WBC: 22.6 10*3/uL — AB (ref 4.0–10.5)

## 2016-07-06 LAB — GLUCOSE, CAPILLARY
Glucose-Capillary: 137 mg/dL — ABNORMAL HIGH (ref 65–99)
Glucose-Capillary: 163 mg/dL — ABNORMAL HIGH (ref 65–99)

## 2016-07-06 LAB — MAGNESIUM: MAGNESIUM: 1.9 mg/dL (ref 1.7–2.4)

## 2016-07-06 LAB — URIC ACID: Uric Acid, Serum: 4.9 mg/dL (ref 4.4–7.6)

## 2016-07-06 LAB — PHOSPHORUS: Phosphorus: 3.4 mg/dL (ref 2.5–4.6)

## 2016-07-06 MED ORDER — ACETAMINOPHEN 325 MG PO TABS: 650.0000 mg | ORAL_TABLET | Freq: Four times a day (QID) | ORAL | Status: DC | PRN

## 2016-07-06 MED ORDER — SODIUM CHLORIDE 0.9 % IV SOLN
INTRAVENOUS | Status: DC
Start: 2016-07-06 — End: 2016-07-09
  Administered 2016-07-06 – 2016-07-08 (×4): via INTRAVENOUS

## 2016-07-06 MED ORDER — CEFAZOLIN IN D5W 1 GM/50ML IV SOLN
1.0000 g | Freq: Once | INTRAVENOUS | Status: AC
  Administered 2016-07-06: 1 g via INTRAVENOUS
  Filled 2016-07-06: qty 50

## 2016-07-06 MED ORDER — KETOROLAC TROMETHAMINE 30 MG/ML IJ SOLN
30.0000 mg | Freq: Three times a day (TID) | INTRAMUSCULAR | Status: DC | PRN
  Administered 2016-07-07 – 2016-07-08 (×2): 30 mg via INTRAVENOUS
  Filled 2016-07-06 (×2): qty 1

## 2016-07-06 MED ORDER — LISINOPRIL 10 MG PO TABS
10.0000 mg | ORAL_TABLET | Freq: Every day | ORAL | Status: DC
  Administered 2016-07-06 – 2016-07-09 (×4): 10 mg via ORAL
  Filled 2016-07-06 (×4): qty 1

## 2016-07-06 MED ORDER — SODIUM CHLORIDE 0.9 % IV SOLN
INTRAVENOUS | Status: DC
  Administered 2016-07-06: 12:00:00 via INTRAVENOUS

## 2016-07-06 MED ORDER — CEPHALEXIN 500 MG PO CAPS
500.0000 mg | ORAL_CAPSULE | Freq: Three times a day (TID) | ORAL | Status: DC
  Administered 2016-07-06 – 2016-07-09 (×9): 500 mg via ORAL
  Filled 2016-07-06 (×9): qty 1

## 2016-07-06 MED ORDER — FUROSEMIDE 40 MG PO TABS
40.0000 mg | ORAL_TABLET | Freq: Every day | ORAL | Status: DC
  Administered 2016-07-06 – 2016-07-09 (×4): 40 mg via ORAL
  Filled 2016-07-06 (×4): qty 1

## 2016-07-06 MED ORDER — HYDROMORPHONE HCL 1 MG/ML IJ SOLN
1.0000 mg | Freq: Once | INTRAMUSCULAR | Status: AC
  Administered 2016-07-06: 1 mg via INTRAVENOUS
  Filled 2016-07-06: qty 1

## 2016-07-06 MED ORDER — ENOXAPARIN SODIUM 120 MG/0.8ML ~~LOC~~ SOLN
110.0000 mg | SUBCUTANEOUS | Status: DC
  Administered 2016-07-06 – 2016-07-08 (×3): 110 mg via SUBCUTANEOUS
  Filled 2016-07-06 (×4): qty 0.8

## 2016-07-06 MED ORDER — ACETAMINOPHEN 650 MG RE SUPP: 650.0000 mg | Freq: Four times a day (QID) | RECTAL | Status: DC | PRN

## 2016-07-06 MED ORDER — INSULIN ASPART 100 UNIT/ML ~~LOC~~ SOLN
0.0000 [IU] | Freq: Three times a day (TID) | SUBCUTANEOUS | Status: DC
  Administered 2016-07-06: 3 [IU] via SUBCUTANEOUS
  Administered 2016-07-07 (×3): 11 [IU] via SUBCUTANEOUS
  Administered 2016-07-08: 7 [IU] via SUBCUTANEOUS
  Administered 2016-07-08 (×2): 15 [IU] via SUBCUTANEOUS
  Administered 2016-07-09: 4 [IU] via SUBCUTANEOUS
  Administered 2016-07-09: 3 [IU] via SUBCUTANEOUS
  Administered 2016-07-09: 7 [IU] via SUBCUTANEOUS

## 2016-07-06 MED ORDER — INSULIN ASPART 100 UNIT/ML ~~LOC~~ SOLN
0.0000 [IU] | Freq: Every day | SUBCUTANEOUS | Status: DC
  Administered 2016-07-07: 3 [IU] via SUBCUTANEOUS
  Administered 2016-07-08: 2 [IU] via SUBCUTANEOUS

## 2016-07-06 MED ORDER — COLCHICINE 0.6 MG PO TABS
0.6000 mg | ORAL_TABLET | Freq: Two times a day (BID) | ORAL | Status: DC
  Administered 2016-07-06 – 2016-07-08 (×4): 0.6 mg via ORAL
  Filled 2016-07-06 (×7): qty 1

## 2016-07-06 MED ORDER — METHYLPREDNISOLONE SODIUM SUCC 125 MG IJ SOLR
60.0000 mg | Freq: Two times a day (BID) | INTRAMUSCULAR | Status: DC
  Administered 2016-07-06 – 2016-07-08 (×4): 60 mg via INTRAVENOUS
  Filled 2016-07-06 (×4): qty 2

## 2016-07-06 NOTE — H&P (Signed)
History and Physical    Ronnie Hernandez [id number] DOB: 07-17-1960 DOA: 07/06/2016  Referring Provider: Dr. Tomi Bamberger PCP: [name], NP   Patient coming from: home   Chief Complaint: acute gout attack and LE cellulitis   HPI: Ronnie Hernandez is a 56 y.o. male with PMH significant for morbid obesity, gout, HTN, HLD and type 2 diabetes; who presented to ED with 1 week of severe/progressive pain in his feet, hands and knees. Patient reports joint pain has been escalating till the point he is not able to move or bear weight now. He was recently started on Uloric for his gout and noticed increase joint pain since initiation of medication. Patient with swollen ankles, severe pain on both feet, hands and knees. 10/10 in intensity, constant and w/o radiation.  Patient denies CP, SOB, nausea, vomiting, dysuria, hematuria, melena, hematochezia or any other complaints.   ED Course: IVF's, pain medications and one dose of IV antibiotics for suspected LE cellulitis given. Found to have elevated wBC's. TRH called to place in observation due to inability to walk or care for himself while treating severe gout arthropathy.   Review of Systems:  All other systems reviewed and apart from HPI, are negative.  Past Medical History:  Diagnosis Date  . Arthritis    right fingers, both knees  . Diabetes type 2, uncontrolled ([place])   . Gout of hand   . Hyperlipidemia   . Hypertension   . Morbidly obese [facility])     Past Surgical History:  Procedure Laterality Date  . unremarkable       reports that he quit smoking about 34 years ago. He has never used smokeless tobacco. He reports that he does not drink alcohol or use drugs.  No Known Allergies  Family History  Problem Relation Age of Onset  . Colon cancer Maternal Grandfather 33  . Heart attack Father   . Stomach cancer Maternal Aunt 80     Prior to Admission medications   Medication Sig Start Date End Date Taking? Authorizing  Provider  colchicine 0.6 MG tablet TAKE 1 TABLET (0.6 MG TOTAL) BY MOUTH 2 (TWO) TIMES DAILY. Patient taking differently: TAKE 1 TABLET (0.6 MG TOTAL) BY MOUTH 2 (TWO) TIMES DAILY as needed for pain 11/25/14  Yes Tiffany L Reed, DO  Febuxostat 80 MG TABS Take 1 tablet (80 mg total) by mouth daily. 06/29/16  Yes [name], NP  furosemide (LASIX) 20 MG tablet Take 2 tablets (40 mg total) by mouth daily. 03/01/16  Yes [name], NP    Physical Exam: Vitals:   07/06/16 1005 07/06/16 1015 07/06/16 1225  BP:  156/75 120/62  Pulse:  98 98  Resp:  18 20  Temp:  98.2 F (36.8 C) 99.2 F (37.3 C)  TempSrc:  Oral Oral  SpO2: 98% 99% 93%    Constitutional: in mild distress due to pain and frustrated with inability to move or bear weight. No CP, no SOB, no nausea, no vomiting. Eyes: PERTLA, lids and conjunctivae normal, no icterus ENMT: Mucous membranes are moist. Posterior pharynx clear of any exudate or lesions. Normal dentition.  Neck: normal, supple, no masses, no thyromegaly, no JVD appreciated Respiratory: clear to auscultation bilaterally, no wheezing, no crackles. Normal respiratory effort. No accessory muscle use.  Cardiovascular: S1 & S2 heard, regular rate and rhythm, no murmurs / rubs / gallops. No extremity edema. 2+ pedal pulses. Abdomen: obese, No distension, no tenderness, no masses palpated. No hepatosplenomegaly.  Bowel sounds normal.  Musculoskeletal: decrease range of motion, especially hand and feet; patient with tender on passive movement and unable to bear weight (acute)  Skin: erythema on his LE, warm to touch and tender to palpation. Symptoms > accentuated on his LLE. No open wounds. No induration Neurologic: CN 2-12 grossly intact. Sensation intact, DTR normal. Strength 4/5 in all 4 limbs due to severe joint pain and deconditioning.  Psychiatric: Normal judgment and insight. Alert and oriented x 3. Normal mood.     Labs on Admission: I have personally reviewed  following labs and imaging studies  CBC:  Recent Labs Lab 07/06/16 1159  WBC 22.6*  NEUTROABS 19.3*  HGB 12.5*  HCT 36.7*  MCV 93.4  PLT [id number]   Basic Metabolic Panel:  Recent Labs Lab 07/06/16 1159  NA 130*  K 4.6  CL 95*  CO2 25  GLUCOSE 156*  BUN 18  CREATININE 1.05  CALCIUM 8.3*   GFR: Estimated Creatinine Clearance: 139 mL/min (by C-G formula based on SCr of 1.05 mg/dL).  Urine analysis:    Component Value Date/Time   COLORURINE YELLOW 01/26/2016 2145   APPEARANCEUR CLEAR 01/26/2016 2145   APPEARANCEUR Clear 12/05/2015 1226   LABSPEC 1.011 01/26/2016 2145   PHURINE 5.0 01/26/2016 2145   GLUCOSEU NEGATIVE 01/26/2016 2145   HGBUR NEGATIVE 01/26/2016 2145   BILIRUBINUR NEGATIVE 01/26/2016 2145   BILIRUBINUR Negative 12/05/2015 1226   KETONESUR NEGATIVE 01/26/2016 2145   PROTEINUR NEGATIVE 01/26/2016 2145   UROBILINOGEN 0.2 12/28/2013 1117   NITRITE NEGATIVE 01/26/2016 2145   LEUKOCYTESUR NEGATIVE 01/26/2016 2145   LEUKOCYTESUR Negative 12/05/2015 1226    Radiological Exams on Admission: Dg Foot 2 Views Left  Result Date: 07/06/2016 CLINICAL DATA:  Chronic bilateral foot pain in patient with a history of gout. EXAM: LEFT FOOT - 2 VIEW COMPARISON:  None. FINDINGS: No fracture is identified. Erosions are seen in the heads of all the metatarsals with overhanging edges. There also appears to be fusion across all the bones of the midfoot. Plantar calcaneal spur is noted. IMPRESSION: No acute abnormality. Ankylosis of the bones the midfoot and IP joint of the great toe. Findings compatible gouty arthropathy involving all of the MTP joints. Electronically Signed   By: Inge Rise M.D.   On: 07/06/2016 11:22   Dg Foot 2 Views Right  Result Date: 07/06/2016 CLINICAL DATA:  55 year old male with gout in chronic bilateral foot pain. Initial encounter. EXAM: RIGHT FOOT - 2 VIEW COMPARISON:  Left foot films same date dictated separately. No comparison right foot  films. FINDINGS: Erosions most notable metatarsal head level (1 through 5) as well as base of the right first proximal phalanx and right fifth proximal phalanx. Findings consistent with patient's history of gout. Superimposed bony overgrowth in joint space narrowing. Fusion midfoot and hindfoot without fracture noted. Spur plantar fascia origin and Achilles tendon insertion. Vascular calcifications. Soft tissue prominence may be related to patient's habitus or dependent edema rather than cellulitis. IMPRESSION: Erosive changes metatarsal heads (1 through 5), right first and fifth proximal phalanx consistent with gouty arthropathy. Fusion midfoot and hindfoot. Electronically Signed   By: Genia Del M.D.   On: 07/06/2016 11:39    EKG:  None   Assessment/Plan 1-Acute gout arthropathy: triggered by initiation of Uloric -will provide IVF's -Place in observation  -started him on solumedrol and continue colchicine -will check uric acid -hold uloric for now -PT consulted -nutritional service consulted  2-Hyperlipidemia: -will check lipid panel -was taken off  statins   3-Morbidly obese ([facility]) -low calorie diet, increase exercise and urgent drastic weight loss encouraged. -BMI > 45  4-Hypertension -will continue lisinopril -BP is stable  5-Type 2 diabetes mellitus ([place]): with hyperglycemia -most recent A1C 18 days ago 5.7 -will use SSI while inpatient and hold oral hypoglycemic agents -anticipate elevation on CBG's with use of steroids  6-Cellulitis of left lower extremity -will treat with keflex  7-Leukocytosis: demargination from gout attack vs due to cellulitis; most likely combination of both. -will treat with steroids and antibiotics -follow WBC's trend  -UA neg for infection and patient no complaining of coughing, fever or SOB.  8-Unable to walk -PT consulted    Time: 55 minutes  DVT prophylaxis: lovenox  Code Status: Full Family Communication: no family at bedside     Disposition Plan: to be determined, anticipate hopefully less than 2 midnights before discharge (needs to be able to walk to assist taking care of himself) Consults called: none  Admission status: observation, med-surg bed, LOS < 2 midnights     Barton Dubois MD [facility] Pager [phone]  If 7PM-7AM, please contact night-coverage [email or web address] Password [email or web address]  07/06/2016, 2:18 PM

## 2016-07-06 NOTE — ED Provider Notes (Signed)
[facility] DEPT Provider Note   CSN: [id number] Arrival date & time: 07/06/16  [id number]     History   Chief Complaint No chief complaint on file.   HPI Ronnie Hernandez is a 56 y.o. male.  HPI Patient presents to the emergency room with complaints of bilateral foot pain that he associates with his gout. Patient has a history of morbid obesity and gout. He was taking allopurinol but according to the patient his uric acid levels were elevated so his doctor switched him to another medication about a week ago. Does not recall the name of that medication.  Since that change, he is noticed gradually worsening pain that he associates with his gout. He is having severe pain in his feet.  Any movement or palpation of his feet causes severe pain. He has not been taking any specific medications for pain. He tried to walk today but was not able to stand and bear any weight so he came into the emergency room. He denies any trouble with fevers. No trouble with chest pain or shortness of breath. He has had a mild cough but otherwise no other symptoms. Past Medical History:  Diagnosis Date  . Arthritis    right fingers, both knees  . Diabetes type 2, uncontrolled ([place])   . Gout of hand   . Hyperlipidemia   . Hypertension   . Morbidly obese [facility])     Patient Active Problem List   Diagnosis Date Noted  . Abnormality of gait 02/18/2016  . Cellulitis of left lower extremity 01/27/2016  . Type 2 diabetes mellitus ([place]) 12/25/2013  . Drug-induced gout 12/25/2013  . Hyperlipidemia 08/01/2013  . Morbidly obese ([place])   . Hypertension   . Diabetes type 2, uncontrolled ([place])   . Gout of hand   . Colon cancer screening 07/27/2011  . Benign neoplasm of colon 07/27/2011    Past Surgical History:  Procedure Laterality Date  . unremarkable         Home Medications    Prior to Admission medications   Medication Sig Start Date End Date Taking? Authorizing Provider  colchicine 0.6 MG tablet  TAKE 1 TABLET (0.6 MG TOTAL) BY MOUTH 2 (TWO) TIMES DAILY. Patient taking differently: TAKE 1 TABLET (0.6 MG TOTAL) BY MOUTH 2 (TWO) TIMES DAILY as needed for pain 11/25/14  Yes Tiffany L Reed, DO  Febuxostat 80 MG TABS Take 1 tablet (80 mg total) by mouth daily. 06/29/16  Yes [name], NP  furosemide (LASIX) 20 MG tablet Take 2 tablets (40 mg total) by mouth daily. 03/01/16  Yes [name], NP  amoxicillin-clavulanate (AUGMENTIN) 875-125 MG tablet Take 1 tablet by mouth 2 (two) times daily. 06/23/16-06/30/15 06/23/16   Historical Provider, MD  aspirin 81 MG EC tablet TAKE 1 TABLET (81 MG TOTAL) BY MOUTH DAILY. Patient not taking: Reported on 07/06/2016 08/20/14   Blanchie Serve, MD  doxycycline (VIBRAMYCIN) 100 MG capsule Take 100 mg by mouth 2 (two) times daily. 06/23/16-06/29/16 06/23/16   Historical Provider, MD  lisinopril (PRINIVIL,ZESTRIL) 10 MG tablet Take 1 tablet (10 mg total) by mouth daily. Patient not taking: Reported on 07/06/2016 11/27/14   Estill Dooms, MD  metFORMIN (GLUCOPHAGE) 500 MG tablet Take 1 tablet by mouth twice a day with food. Patient not taking: Reported on 07/06/2016 08/18/15   [name], NP  potassium chloride (K-DUR) 10 MEQ tablet Take 1 tablet (10 mEq total) by mouth daily. Patient not taking: Reported on 07/06/2016 03/01/16  [name], NP  simvastatin (ZOCOR) 10 MG tablet Take 1 tablet (10 mg total) by mouth at bedtime. Patient not taking: Reported on 07/06/2016 11/07/14   [name], NP    Family History Family History  Problem Relation Age of Onset  . Colon cancer Maternal Grandfather 39  . Heart attack Father   . Stomach cancer Maternal Aunt 80    Social History Social History  Substance Use Topics  . Smoking status: Former Smoker    Quit date: 02/09/1982  . Smokeless tobacco: Never Used  . Alcohol use No     Allergies   Patient has no known allergies.   Review of Systems Review of Systems  All other systems reviewed and are  negative.    Physical Exam Updated Vital Signs BP 120/62 (BP Location: Right Wrist)   Pulse 98   Temp 99.2 F (37.3 C) (Oral)   Resp 20   SpO2 93%   Physical Exam  Constitutional: No distress.  Patient is morbidly obese  HENT:  Head: Normocephalic and atraumatic.  Right Ear: External ear normal.  Left Ear: External ear normal.  Eyes: Conjunctivae are normal. Right eye exhibits no discharge. Left eye exhibits no discharge. No scleral icterus.  Neck: Neck supple. No tracheal deviation present.  Cardiovascular: Normal rate, regular rhythm and intact distal pulses.   Pulmonary/Chest: Effort normal and breath sounds normal. No stridor. No respiratory distress. He has no wheezes. He has no rales.  Abdominal: Soft. Bowel sounds are normal. He exhibits no distension. There is no tenderness. There is no rebound and no guarding.  Musculoskeletal: He exhibits edema and tenderness.  Edema bilateral lower extremities, tenderness to palpation bilateral feet and calf, erythema of the skin, no increased warmth, nl perfusion no calf tenderness or swelling  Neurological: He is alert. He has normal strength. No cranial nerve deficit (no facial droop, extraocular movements intact, no slurred speech) or sensory deficit. He exhibits normal muscle tone. He displays no seizure activity. Coordination normal.  Skin: Skin is warm and dry. No rash noted.  Psychiatric: He has a normal mood and affect.  Nursing note and vitals reviewed.    ED Treatments / Results  Labs (all labs ordered are listed, but only abnormal results are displayed) Labs Reviewed  CBC WITH DIFFERENTIAL/PLATELET - Abnormal; Notable for the following:       Result Value   WBC 22.6 (*)    RBC 3.93 (*)    Hemoglobin 12.5 (*)    HCT 36.7 (*)    Neutro Abs 19.3 (*)    Monocytes Absolute 2.0 (*)    All other components within normal limits  BASIC METABOLIC PANEL - Abnormal; Notable for the following:    Sodium 130 (*)    Chloride  95 (*)    Glucose, Bld 156 (*)    Calcium 8.3 (*)    All other components within normal limits  URINE CULTURE  URINALYSIS, ROUTINE W REFLEX MICROSCOPIC     Radiology Dg Foot 2 Views Left  Result Date: 07/06/2016 CLINICAL DATA:  Chronic bilateral foot pain in patient with a history of gout. EXAM: LEFT FOOT - 2 VIEW COMPARISON:  None. FINDINGS: No fracture is identified. Erosions are seen in the heads of all the metatarsals with overhanging edges. There also appears to be fusion across all the bones of the midfoot. Plantar calcaneal spur is noted. IMPRESSION: No acute abnormality. Ankylosis of the bones the midfoot and IP joint of the great toe.  Findings compatible gouty arthropathy involving all of the MTP joints. Electronically Signed   By: Inge Rise M.D.   On: 07/06/2016 11:22   Dg Foot 2 Views Right  Result Date: 07/06/2016 CLINICAL DATA:  56 year old male with gout in chronic bilateral foot pain. Initial encounter. EXAM: RIGHT FOOT - 2 VIEW COMPARISON:  Left foot films same date dictated separately. No comparison right foot films. FINDINGS: Erosions most notable metatarsal head level (1 through 5) as well as base of the right first proximal phalanx and right fifth proximal phalanx. Findings consistent with patient's history of gout. Superimposed bony overgrowth in joint space narrowing. Fusion midfoot and hindfoot without fracture noted. Spur plantar fascia origin and Achilles tendon insertion. Vascular calcifications. Soft tissue prominence may be related to patient's habitus or dependent edema rather than cellulitis. IMPRESSION: Erosive changes metatarsal heads (1 through 5), right first and fifth proximal phalanx consistent with gouty arthropathy. Fusion midfoot and hindfoot. Electronically Signed   By: Genia Del M.D.   On: 07/06/2016 11:39    Procedures Procedures (including critical care time)  Medications Ordered in ED Medications  0.9 %  sodium chloride infusion (  Intravenous New Bag/Given 07/06/16 1145)  HYDROmorphone (DILAUDID) injection 1 mg (not administered)  ceFAZolin (ANCEF) IVPB 1 g/50 mL premix (not administered)  HYDROmorphone (DILAUDID) injection 1 mg (1 mg Intravenous Given 07/06/16 1145)     Initial Impression / Assessment and Plan / ED Course  I have reviewed the triage vital signs and the nursing notes.  Pertinent labs & imaging results that were available during my care of the patient were reviewed by me and considered in my medical decision making (see chart for details).  Clinical Course as of Jul 06 1309  Tue Jul 06, 2016  1250 Pt noticed his urine is red.  Will check a ua  [JK]    Clinical Course User Index [JK] Dorie Rank, MD   Patient presents to the emergency room with increasing pain and swelling of his feet. Patient attributes symptoms to a gout flare he has had cellulitis in his lower extremities in the past. Patient was treated with pain medications. Unfortunately still has severe pain and is unable to stand. Laboratory tests show an elevated white blood cell count. Underlying symptoms may be related to his gout but I'm also concerned about the possibility of cellulitis. We'll plan on IV antibiotics and admission and further evaluation.  Final Clinical Impressions(s) / ED Diagnoses   Final diagnoses:  Cellulitis, unspecified cellulitis site  Acute gout, unspecified cause, unspecified site  Type 2 diabetes mellitus with hyperosmolarity without coma, without long-term current use of insulin [facility])    New Prescriptions New Prescriptions   No medications on file     Dorie Rank, MD 07/06/16 1312

## 2016-07-06 NOTE — ED Triage Notes (Signed)
Per EMS, pt is coming from home with complaints of gout pain. Pt reports a recent change in his gout medication that he feels triggered the pain. Pt reports having pain x 1 week. Pt states he is unable to ambulate at this time.

## 2016-07-06 NOTE — ED Notes (Signed)
Bed: [id number] Expected date:  Expected time:  Means of arrival:  Comments: EMS-gout pain/obese

## 2016-07-07 DIAGNOSIS — I10 Essential (primary) hypertension: Secondary | ICD-10-CM | POA: Diagnosis not present

## 2016-07-07 DIAGNOSIS — M1029 Drug-induced gout, multiple sites: Secondary | ICD-10-CM | POA: Diagnosis not present

## 2016-07-07 DIAGNOSIS — R262 Difficulty in walking, not elsewhere classified: Secondary | ICD-10-CM | POA: Diagnosis present

## 2016-07-07 DIAGNOSIS — R739 Hyperglycemia, unspecified: Secondary | ICD-10-CM | POA: Diagnosis not present

## 2016-07-07 DIAGNOSIS — M109 Gout, unspecified: Secondary | ICD-10-CM | POA: Diagnosis not present

## 2016-07-07 DIAGNOSIS — D72829 Elevated white blood cell count, unspecified: Secondary | ICD-10-CM | POA: Diagnosis not present

## 2016-07-07 DIAGNOSIS — E1100 Type 2 diabetes mellitus with hyperosmolarity without nonketotic hyperglycemic-hyperosmolar coma (NKHHC): Secondary | ICD-10-CM | POA: Diagnosis not present

## 2016-07-07 DIAGNOSIS — E669 Obesity, unspecified: Secondary | ICD-10-CM | POA: Diagnosis not present

## 2016-07-07 DIAGNOSIS — Z79899 Other long term (current) drug therapy: Secondary | ICD-10-CM | POA: Diagnosis not present

## 2016-07-07 DIAGNOSIS — L03116 Cellulitis of left lower limb: Secondary | ICD-10-CM

## 2016-07-07 DIAGNOSIS — Z6841 Body Mass Index (BMI) 40.0 and over, adult: Secondary | ICD-10-CM | POA: Diagnosis not present

## 2016-07-07 DIAGNOSIS — E784 Other hyperlipidemia: Secondary | ICD-10-CM | POA: Diagnosis not present

## 2016-07-07 DIAGNOSIS — R269 Unspecified abnormalities of gait and mobility: Secondary | ICD-10-CM | POA: Diagnosis not present

## 2016-07-07 DIAGNOSIS — T50905A Adverse effect of unspecified drugs, medicaments and biological substances, initial encounter: Secondary | ICD-10-CM | POA: Diagnosis not present

## 2016-07-07 DIAGNOSIS — I159 Secondary hypertension, unspecified: Secondary | ICD-10-CM | POA: Diagnosis not present

## 2016-07-07 DIAGNOSIS — R7302 Impaired glucose tolerance (oral): Secondary | ICD-10-CM | POA: Diagnosis present

## 2016-07-07 DIAGNOSIS — E785 Hyperlipidemia, unspecified: Secondary | ICD-10-CM | POA: Diagnosis not present

## 2016-07-07 DIAGNOSIS — E0865 Diabetes mellitus due to underlying condition with hyperglycemia: Secondary | ICD-10-CM | POA: Diagnosis not present

## 2016-07-07 DIAGNOSIS — Z8249 Family history of ischemic heart disease and other diseases of the circulatory system: Secondary | ICD-10-CM | POA: Diagnosis not present

## 2016-07-07 DIAGNOSIS — T50995A Adverse effect of other drugs, medicaments and biological substances, initial encounter: Secondary | ICD-10-CM | POA: Diagnosis not present

## 2016-07-07 DIAGNOSIS — Z87891 Personal history of nicotine dependence: Secondary | ICD-10-CM | POA: Diagnosis not present

## 2016-07-07 DIAGNOSIS — M1000 Idiopathic gout, unspecified site: Secondary | ICD-10-CM | POA: Diagnosis not present

## 2016-07-07 DIAGNOSIS — L03119 Cellulitis of unspecified part of limb: Secondary | ICD-10-CM | POA: Diagnosis not present

## 2016-07-07 LAB — CBC
HCT: 44.5 % (ref 39.0–52.0)
Hemoglobin: 15 g/dL (ref 13.0–17.0)
MCH: 31.5 pg (ref 26.0–34.0)
MCHC: 33.7 g/dL (ref 30.0–36.0)
MCV: 93.5 fL (ref 78.0–100.0)
PLATELETS: 177 10*3/uL (ref 150–400)
RBC: 4.76 MIL/uL (ref 4.22–5.81)
RDW: 13.7 % (ref 11.5–15.5)
WBC: 15.3 10*3/uL — ABNORMAL HIGH (ref 4.0–10.5)

## 2016-07-07 LAB — LIPID PANEL
Cholesterol: 145 mg/dL (ref 0–200)
HDL: 31 mg/dL — AB (ref >40)
LDL CALC: 97 mg/dL (ref 0–99)
TRIGLYCERIDES: 87 mg/dL (ref <150)
Total CHOL/HDL Ratio: 4.7 RATIO
VLDL: 17 mg/dL (ref 0–40)

## 2016-07-07 LAB — GLUCOSE, CAPILLARY
GLUCOSE-CAPILLARY: 294 mg/dL — AB (ref 65–99)
GLUCOSE-CAPILLARY: 269 mg/dL — AB (ref 65–99)
Glucose-Capillary: 269 mg/dL — ABNORMAL HIGH (ref 65–99)
Glucose-Capillary: 260 mg/dL — ABNORMAL HIGH (ref 65–99)

## 2016-07-07 LAB — BASIC METABOLIC PANEL
Anion gap: 10 (ref 5–15)
BUN: 25 mg/dL — ABNORMAL HIGH (ref 6–20)
CALCIUM: 8.6 mg/dL — AB (ref 8.9–10.3)
CO2: 26 mmol/L (ref 22–32)
CREATININE: 0.98 mg/dL (ref 0.61–1.24)
Chloride: 96 mmol/L — ABNORMAL LOW (ref 101–111)
Glucose, Bld: 251 mg/dL — ABNORMAL HIGH (ref 65–99)
Potassium: 5.4 mmol/L — ABNORMAL HIGH (ref 3.5–5.1)
Sodium: 132 mmol/L — ABNORMAL LOW (ref 135–145)

## 2016-07-07 LAB — URINE CULTURE: Culture: <10000 — AB

## 2016-07-07 NOTE — Plan of Care (Signed)
Problem: Food- and Nutrition-Related Knowledge Deficit (NB-1.1) Goal: Nutrition education Formal process to instruct or train a patient/client in a skill or to impart knowledge to help patients/clients voluntarily manage or modify food choices and eating behavior to maintain or improve health. Outcome: Completed/Met Date Met: 07/07/16  RD consulted for nutrition education regarding weight loss. Will provide "Low Purinol" handout for patient's history of gout.  BMI: 73.6 kg/m^2. Pt meets criteria for morbid obesity based on current BMI.  RD provided "Weight Loss Tips" and "Low Purinol Nutrition Therapy" handouts from the Academy of [employer] Dietetics. Emphasized the importance of serving sizes and provided examples of correct portions of common foods. Discussed importance of controlled and consistent intake throughout the day. Provided examples of ways to balance meals/snacks and encouraged intake of high-fiber, whole grain complex carbohydrates. Emphasized the importance of hydration with calorie-free beverages and limiting sugar-sweetened beverages. Encouraged pt to discuss physical activity options with physician. Teach back method used.  RD will follow-up with patient for questions.  Current diet order is Heart Healthy/CHO modified, patient is consuming approximately 100% of meals at this time. Labs and medications reviewed. No further nutrition interventions warranted at this time.. If additional nutrition issues arise, please re-consult RD.  Clayton Bibles, MS, RD, LDN Pager: [phone] After Hours Pager: [phone]

## 2016-07-07 NOTE — Clinical Social Work Note (Signed)
Clinical Social Work Assessment  Patient Details  Name: Ronnie Hernandez MRN: [id number] Date of Birth: June 03, 1961  Date of referral:  07/07/16               Reason for consult:  Discharge Planning                Permission sought to share information with:  Chartered certified accountant granted to share information::  Yes, Verbal Permission Granted  Name::        Agency::     Relationship::     Contact Information:     Housing/Transportation Living arrangements for the past 2 months:  Single Family Home Source of Information:  Patient Patient Interpreter Needed:  None Criminal Activity/Legal Involvement Pertinent to Current Situation/Hospitalization:  No - Comment as needed Significant Relationships:  Adult Children, Neighbor Lives with:  Self Do you feel safe going back to the place where you live?  No Need for family participation in patient care:  No (Coment)  Care giving concerns:  Pt's care cannot be managed at home following hospital d/c.   Social Worker assessment / plan:  Pt hospitalized under observation from home with acute gout attack and LE cellulitis. PT has recommended SNF at d.c. CSW met with pt at bedside to review PT recommendations and assist with d/c planning. Pt is in agreement with plan For SNF at d/c. SNF search has been initiated and bed offers are pending. CSW will continue to follow to assist with d/c planning to SNF.  Employment status:  Disabled (Comment on whether or not currently receiving Disability) Insurance information:  Managed Medicare PT Recommendations:  [place] / Referral to community resources:  [place]  Patient/Family's Response to care:  Pt is in agreement with plan for SNF.  Patient/Family's Understanding of and Emotional Response to Diagnosis, Current Treatment, and Prognosis:  Pt is disappointed that [facility] has no openings. Pt reports that he was there in the past and  had a good experience. Pt is willing to consider all other SNF offers and appreciates CSW assistance with d/c planning.  Emotional Assessment Appearance:  Appears stated age Attitude/Demeanor/Rapport:  Other (cooperative) Affect (typically observed):  Pleasant, Appropriate Orientation:  Oriented to Self, Oriented to Place, Oriented to  Time, Oriented to Situation Alcohol / Substance use:  Not Applicable Psych involvement (Current and /or in the community):  No (Comment)  Discharge Needs  Concerns to be addressed:  Discharge Planning Concerns Readmission within the last 30 days:  No Current discharge risk:  None Barriers to Discharge:  No Barriers Identified   Luretha Rued, [place] 07/07/2016, 1:17 PM

## 2016-07-07 NOTE — Evaluation (Signed)
Physical Therapy Evaluation Patient Details Name: Ronnie Hernandez MRN: [id number] DOB: 09-03-1960 Today's Date: 07/07/2016   History of Present Illness  56 y.o. male with PMH significant for morbid obesity, gout, HTN, HLD and type 2 diabetes; who presented to ED with 1 week of severe/progressive pain in his feet, hands and knees. Patient reports joint pain has been escalating till the point he is not able to move or bear weight.  Clinical Impression  Pt admitted with above diagnosis. Pt currently with functional limitations due to the deficits listed below (see PT Problem List).  Pt will benefit from skilled PT to increase their independence and safety with mobility to allow discharge to the venue listed below.  Pt currently needs +2 A for getting to the side of the bed.  He was unable to achieve full standing, but was able to clear buttocks from bed.  Recommend SNF as pt lives alone and currently requires +2 assistance.     Follow Up Recommendations SNF    Equipment Recommendations  None recommended by PT    Recommendations for Other Services       Precautions / Restrictions Precautions Precautions: Fall Precaution Comments: obesity      Mobility  Bed Mobility Overal bed mobility: Needs Assistance Bed Mobility: Supine to Sit;Sit to Supine     Supine to sit: Max assist;+2 for physical assistance Sit to supine: +2 for physical assistance;Mod assist   General bed mobility comments: Pt needing MAX A of 2 to come to EOB.  MOD of 2 to scoot to [facility] in sitting.  MOD of 2 to return to supine, with most help at LEs.  Transfers Overall transfer level: Needs assistance Equipment used: Rolling walker (2 wheeled) Transfers: Sit to/from Stand Sit to Stand: Max assist;+2 physical assistance         General transfer comment: Attempted with split hand position and unable to achieve full standing due to weakness.  Changed position and had pt pull on front of Rw with PT holding in front  and tech assisting pt from behind.  Pt able to clear buttocks from bed, but unable to extend arms on front of RW or place on hand grips and had to return sitting.  Ambulation/Gait             General Gait Details: unable  Stairs            Wheelchair Mobility    Modified Rankin (Stroke Patients Only)       Balance Overall balance assessment: Needs assistance   Sitting balance-Leahy Scale: Fair       Standing balance-Leahy Scale: Zero                               Pertinent Vitals/Pain Pain Assessment: 0-10 Pain Score: 9  Pain Location: B ankles and feet with standing attempts Pain Descriptors / Indicators: Aching;Burning;Grimacing;Sore;Tender Pain Intervention(s): Limited activity within patient's tolerance;Monitored during session;Repositioned    Home Living Family/patient expects to be discharged to:: Private residence Living Arrangements: Alone Available Help at Discharge: Friend(s);Available PRN/intermittently Type of Home: Apartment Home Access: Level entry     Home Layout: One level Home Equipment: Bedside commode;Tub bench;Walker - 4 wheels      Prior Function Level of Independence: Independent with assistive device(s)         Comments: Amb with rollator for short community distances and uses electric cart at grocery store  Hand Dominance        Extremity/Trunk Assessment        Lower Extremity Assessment Lower Extremity Assessment: RLE deficits/detail;LLE deficits/detail RLE Deficits / Details: Pt limited by adipose tissue. Able to extend LE in sitting to don shoes. LLE Deficits / Details: Pt limited by adipose tissue. Able to extend LE in sitting to don shoes.       Communication   Communication: No difficulties  Cognition Arousal/Alertness: Awake/alert Behavior During Therapy: WFL for tasks assessed/performed Overall Cognitive Status: Within Functional Limits for tasks assessed                       General Comments      Exercises     Assessment/Plan    PT Assessment Patient needs continued PT services  PT Problem List Decreased strength;Decreased activity tolerance;Decreased balance;Decreased mobility;Obesity;Pain          PT Treatment Interventions DME instruction;Gait training;Functional mobility training;Therapeutic activities;Therapeutic exercise;Balance training;Neuromuscular re-education    PT Goals (Current goals can be found in the Care Plan section)  Acute Rehab PT Goals Patient Stated Goal: return to PLOF PT Goal Formulation: With patient Time For Goal Achievement: 07/21/16 Potential to Achieve Goals: Good    Frequency Min 3X/week   Barriers to discharge Decreased caregiver support      Co-evaluation               End of Session Equipment Utilized During Treatment: Gait belt Activity Tolerance: Patient tolerated treatment well;Patient limited by pain Patient left: in bed;with call bell/phone within reach Nurse Communication: Mobility status    Functional Assessment Tool Used: clinical judgement and objective findings Functional Limitation: Changing and maintaining body position Changing and Maintaining Body Position Current Status [id number]): At least 80 percent but less than 100 percent impaired, limited or restricted Changing and Maintaining Body Position Goal Status [id number]): At least 20 percent but less than 40 percent impaired, limited or restricted    Time: 1129 (MD in for a portion of tx time)-1213 PT Time Calculation (min) (ACUTE ONLY): 44 min   Charges:   PT Evaluation $PT Eval Moderate Complexity: 1 Procedure PT Treatments $Therapeutic Activity: 8-22 mins   PT G Codes:   PT G-Codes **NOT FOR INPATIENT CLASS** Functional Assessment Tool Used: clinical judgement and objective findings Functional Limitation: Changing and maintaining body position Changing and Maintaining Body Position Current Status [id number]): At least 80 percent but  less than 100 percent impaired, limited or restricted Changing and Maintaining Body Position Goal Status [id number]): At least 20 percent but less than 40 percent impaired, limited or restricted    [facility] LUBECK 07/07/2016, 12:39 PM

## 2016-07-07 NOTE — Progress Notes (Signed)
Inpatient Diabetes Program Recommendations  AACE/ADA: New Consensus Statement on Inpatient Glycemic Control (2015)  Target Ranges:  Prepandial:   less than 140 mg/dL      Peak postprandial:   less than 180 mg/dL (1-2 hours)      Critically ill patients:  140 - 180 mg/dL   Results for [name], [name] (MRN [id number]) as of 07/07/2016 08:50  Ref. Range 07/06/2016 17:54 07/06/2016 20:57 07/07/2016 08:05  Glucose-Capillary Latest Ref Range: 65 - 99 mg/dL 137 (H) 163 (H) 294 (H)   Results for Ronnie Hernandez, Ronnie Hernandez (MRN [id number]) as of 07/07/2016 08:50  Ref. Range 06/17/2016 14:25  Hemoglobin A1C Latest Ref Range: <5.7 % 5.7 (H)    Admit with: Gout Flare/ LE Cellulitis  History: DM  Home DM Meds: Metformin 500 mg BID  Current Insulin Orders: Novolog Resistant Correction Scale/ SSI (0-20 units) TID AC + HS     MD- Note patient started on Solumedrol 60 mg BID last PM.  AM CBG elevated to 294 mg/dl this AM.  Home oral DM medications on hold at present.  If patient continues to have glucose elevations while on steroids, please consider the following:  Start basal insulin: Lantus 20 units daily (0.1 units/kg dosing based on weight of 213 kg)    --Will follow patient during hospitalization--  Wyn Quaker RN, MSN, CDE Diabetes Coordinator Inpatient Glycemic Control Team Team Pager: [phone] (8a-5p)'

## 2016-07-07 NOTE — Clinical Social Work Placement (Signed)
   CLINICAL SOCIAL WORK PLACEMENT  NOTE  Date:  07/07/2016  Patient Details  Name: Ronnie Hernandez MRN: [id number] Date of Birth: 01-10-1961  Clinical Social Work is seeking post-discharge placement for this patient at the [place] level of care (*CSW will initial, date and re-position this form in  chart as items are completed):  Yes   Patient/family provided with [place] Work Department's list of facilities offering this level of care within the geographic area requested by the patient (or if unable, by the patient's family).  Yes   Patient/family informed of their freedom to choose among providers that offer the needed level of care, that participate in Medicare, Medicaid or managed care program needed by the patient, have an available bed and are willing to accept the patient.  Yes   Patient/family informed of [facility]'s ownership interest in [facility] and [facility], as well as of the fact that they are under no obligation to receive care at these facilities.  PASRR submitted to EDS on       PASRR number received on       Existing PASRR number confirmed on 07/07/16     FL2 transmitted to all facilities in geographic area requested by pt/family on 07/07/16     FL2 transmitted to all facilities within larger geographic area on       Patient informed that his/her managed care company has contracts with or will negotiate with certain facilities, including the following:            Patient/family informed of bed offers received.  Patient chooses bed at       Physician recommends and patient chooses bed at      Patient to be transferred to   on  .  Patient to be transferred to facility by       Patient family notified on   of transfer.  Name of family member notified:        PHYSICIAN       Additional Comment:    _______________________________________________ Luretha Rued, [place] 07/07/2016, 1:22  PM

## 2016-07-07 NOTE — NC FL2 (Signed)
[place] LEVEL OF CARE SCREENING TOOL     IDENTIFICATION  Patient Name: Ronnie Hernandez Birthdate: 1960/09/01 Sex: male Admission Date (Current Location): 07/06/2016  [facility] and [place] Number:  Herbalist and Address:  [facility],  [place] [address] [place]      Provider Number: [id number]  Attending Physician Name and Address:  Orson Eva, MD  Relative Name and Phone Number:       Current Level of Care: Hospital Recommended Level of Care: [place] Prior Approval Number:    Date Approved/Denied:   PASRR Number: [id number]  Discharge Plan: SNF    Current Diagnoses: Patient Active Problem List   Diagnosis Date Noted  . Leukocytosis 07/06/2016  . Unable to walk 07/06/2016  . Acute gouty arthropathy 07/06/2016  . Abnormality of gait 02/18/2016  . Cellulitis of left lower extremity 01/27/2016  . Type 2 diabetes mellitus ([place]) 12/25/2013  . Drug-induced gout 12/25/2013  . Hyperlipidemia 08/01/2013  . Morbidly obese ([place])   . Hypertension   . Diabetes type 2, uncontrolled ([place])   . Acute gout   . Colon cancer screening 07/27/2011  . Benign neoplasm of colon 07/27/2011    Orientation RESPIRATION BLADDER Height & Weight     Self, Time, Situation, Place  Normal Continent Weight:   Height:     BEHAVIORAL SYMPTOMS/MOOD NEUROLOGICAL BOWEL NUTRITION STATUS  Other (Comment) (no behaviors)   Continent Diet  AMBULATORY STATUS COMMUNICATION OF NEEDS Skin   Extensive Assist Verbally Normal                       Personal Care Assistance Level of Assistance  Bathing, Feeding, Dressing Bathing Assistance: Maximum assistance Feeding assistance: Independent Dressing Assistance: Maximum assistance     Functional Limitations Info  Sight, Hearing, Speech Sight Info: Adequate Hearing Info: Adequate Speech Info: Adequate    SPECIAL CARE FACTORS FREQUENCY  PT (By licensed PT), OT (By licensed OT)      PT Frequency: 5x wk OT Frequency: 5x wk            Contractures Contractures Info: Not present    Additional Factors Info  Code Status Code Status Info: Full Code             Current Medications (07/07/2016):  This is the current hospital active medication list Current Facility-Administered Medications  Medication Dose Route Frequency Provider Last Rate Last Dose  . 0.9 %  sodium chloride infusion   Intravenous Continuous Barton Dubois, MD 75 mL/hr at 07/07/16 0532    . acetaminophen (TYLENOL) tablet 650 mg  650 mg Oral Q6H PRN Barton Dubois, MD       Or  . acetaminophen (TYLENOL) suppository 650 mg  650 mg Rectal Q6H PRN Barton Dubois, MD      . cephALEXin [facility]) capsule 500 mg  500 mg Oral Q8H Barton Dubois, MD   500 mg at 07/07/16 0536  . colchicine tablet 0.6 mg  0.6 mg Oral BID Barton Dubois, MD   0.6 mg at 07/07/16 1055  . enoxaparin (LOVENOX) injection 110 mg  110 mg Subcutaneous Q24H Barton Dubois, MD   110 mg at 07/06/16 2013  . furosemide (LASIX) tablet 40 mg  40 mg Oral Daily Barton Dubois, MD   40 mg at 07/07/16 1055  . insulin aspart (novoLOG) injection 0-20 Units  0-20 Units Subcutaneous TID WC Barton Dubois, MD   11 Units at 07/07/16 [id number]  .  insulin aspart (novoLOG) injection 0-5 Units  0-5 Units Subcutaneous QHS Barton Dubois, MD      . ketorolac (TORADOL) 30 MG/ML injection 30 mg  30 mg Intravenous Q8H PRN Barton Dubois, MD   30 mg at 07/07/16 0810  . lisinopril (PRINIVIL,ZESTRIL) tablet 10 mg  10 mg Oral Daily Barton Dubois, MD   10 mg at 07/07/16 1055  . methylPREDNISolone sodium succinate (SOLU-MEDROL) 125 mg/2 mL injection 60 mg  60 mg Intravenous Q12H Barton Dubois, MD   60 mg at 07/07/16 0536     Discharge Medications: Please see discharge summary for a list of discharge medications.  Relevant Imaging Results:  Relevant Lab Results:   Additional Information ss # [id number]  Kenon Delashmit, Randall An, LCSW

## 2016-07-07 NOTE — Progress Notes (Signed)
PROGRESS NOTE  Ronnie Hernandez [id number] DOB: 13-Jun-1961 DOA: 07/06/2016 PCP: [name], NP  Brief History:   56 y.o. male with PMH significant for morbid obesity, gout, HTN, HLD and type 2 diabetes; who presented to ED with 1 week of severe/progressive pain in his feet, hands and knees. Patient reports joint pain has been escalating till the point he is not able to move or bear weight. He was recently started on Uloric for his gout and noticed increase joint pain since initiation of medication. Patient with swollen ankles, severe pain on both feet, hands and knees. 10/10 in intensity, constant and w/o radiation.   Assessment/Plan: Acute gout arthropathy: triggered by initiation of Uloric -continue solumedrol and continue colchicine -check uric acid--4.9 -hold uloric for now -PT consulted -nutritional service consulted  Hyperlipidemia: -will check lipid panel--LDL 97 -was taken off statins   Morbidly obese (HCC) -low calorie diet, increase exercise and urgent drastic weight loss encouraged. -BMI > 45  Hypertension -continue lisinopril -BP is stable  Impaired glucose tolerance -most recent A1C 5.7 on 06/17/16 -will use SSI while inpatient  -anticipate elevation on CBG's with use of steroids -pt not on any agents oupt  -- "diet controlled"  Cellulitis of left lower extremity -continue keflex  Leukocytosis: demargination from gout attack vs due to cellulitis; most likely combination of both. -will treat with steroids and antibiotics -follow WBC's trend  -UA neg for infection and patient no complaining of coughing, fever or SOB.  Unable to walk -PT consulted        Disposition Plan:   Home 07/08/16 Family Communication:   No Family at bedside  Consultants:  none  Code Status:  FULL  DVT Prophylaxis:   [place] Lovenox   Procedures: As Listed in Progress Note Above  Antibiotics: Cephalexin 07/07/16>>>   Subjective: Patient  denies fevers, chills, headache, chest pain, dyspnea, nausea, vomiting, diarrhea, abdominal pain, dysuria, hematuria, hematochezia, and melena.  Foot pain is improving   Objective: Vitals:   07/06/16 1638 07/06/16 1800 07/06/16 2025 07/07/16 0419  BP: 132/77 (!) 153/57 126/70 126/63  Pulse: 101 (!) 103 (!) 101 82  Resp: 18 18 16 14   Temp:  99.6 F (37.6 C) 99.5 F (37.5 C) 98.8 F (37.1 C)  TempSrc:  Oral Oral Oral  SpO2: 100% 99% 97% 100%    Intake/Output Summary (Last 24 hours) at 07/07/16 1144 Last data filed at 07/07/16 1054  Gross per 24 hour  Intake             1730 ml  Output             1250 ml  Net              480 ml   Weight change:  Exam:   General:  Pt is alert, follows commands appropriately, not in acute distress  HEENT: No icterus, No thrush, No neck mass, [place]/AT  Cardiovascular: RRR, S1/S2, no rubs, no gallops  Respiratory: CTA bilaterally, no wheezing, no crackles, no rhonchi  Abdomen: Soft/+BS, non tender, non distended, no guarding  Extremities: trace LE edema, No lymphangitis, No petechiae, No rashes, no synovitis   Data Reviewed: I have personally reviewed following labs and imaging studies Basic Metabolic Panel:  Recent Labs Lab 07/06/16 1159 07/06/16 2004 07/07/16 0359  NA 130*  --  132*  K 4.6  --  5.4*  CL 95*  --  96*  CO2 25  --  26  GLUCOSE 156*  --  251*  BUN 18  --  25*  CREATININE 1.05 0.99 0.98  CALCIUM 8.3*  --  8.6*  MG  --  1.9  --   PHOS  --  3.4  --    Liver Function Tests: No results for input(s): AST, ALT, ALKPHOS, BILITOT, PROT, ALBUMIN in the last 168 hours. No results for input(s): LIPASE, AMYLASE in the last 168 hours. No results for input(s): AMMONIA in the last 168 hours. Coagulation Profile: No results for input(s): INR, PROTIME in the last 168 hours. CBC:  Recent Labs Lab 07/06/16 1159 07/06/16 2004 07/07/16 0359  WBC 22.6* 18.2* 15.3*  NEUTROABS 19.3*  --   --   HGB 12.5* 11.7* 15.0  HCT 36.7*  35.7* 44.5  MCV 93.4 93.0 93.5  PLT 217 220 177   Cardiac Enzymes: No results for input(s): CKTOTAL, CKMB, CKMBINDEX, TROPONINI in the last 168 hours. BNP: Invalid input(s): POCBNP CBG:  Recent Labs Lab 07/06/16 1754 07/06/16 2057 07/07/16 0805  GLUCAP 137* 163* 294*   HbA1C: No results for input(s): HGBA1C in the last 72 hours. Urine analysis:    Component Value Date/Time   COLORURINE AMBER (A) 07/06/2016 1251   APPEARANCEUR CLEAR 07/06/2016 1251   APPEARANCEUR Clear 12/05/2015 1226   LABSPEC 1.017 07/06/2016 1251   PHURINE 5.0 07/06/2016 1251   GLUCOSEU NEGATIVE 07/06/2016 1251   HGBUR NEGATIVE 07/06/2016 1251   BILIRUBINUR NEGATIVE 07/06/2016 1251   BILIRUBINUR Negative 12/05/2015 1226   KETONESUR NEGATIVE 07/06/2016 1251   PROTEINUR NEGATIVE 07/06/2016 1251   UROBILINOGEN 0.2 12/28/2013 1117   NITRITE NEGATIVE 07/06/2016 1251   LEUKOCYTESUR NEGATIVE 07/06/2016 1251   LEUKOCYTESUR Negative 12/05/2015 1226   Sepsis Labs: @LABRCNTIP (procalcitonin:4,lacticidven:4) )No results found for this or any previous visit (from the past 240 hour(s)).   Scheduled Meds: . cephALEXin  500 mg Oral Q8H  . colchicine  0.6 mg Oral BID  . enoxaparin (LOVENOX) injection  110 mg Subcutaneous Q24H  . furosemide  40 mg Oral Daily  . insulin aspart  0-20 Units Subcutaneous TID WC  . insulin aspart  0-5 Units Subcutaneous QHS  . lisinopril  10 mg Oral Daily  . methylPREDNISolone (SOLU-MEDROL) injection  60 mg Intravenous Q12H   Continuous Infusions: . sodium chloride 75 mL/hr at 07/07/16 0532    Procedures/Studies: Dg Chest 2 View  Result Date: 06/20/2016 CLINICAL DATA:  Cough and chest congestion over the last 3 days. Fluid in the lower extremities. EXAM: CHEST  2 VIEW COMPARISON:  01/26/2016 FINDINGS: The heart size and mediastinal contours are within normal limits. Both lungs are clear. The visualized skeletal structures are unremarkable. IMPRESSION: No active cardiopulmonary  disease. Electronically Signed   By: Nelson Chimes M.D.   On: 06/20/2016 21:49   Dg Foot 2 Views Left  Result Date: 07/06/2016 CLINICAL DATA:  Chronic bilateral foot pain in patient with a history of gout. EXAM: LEFT FOOT - 2 VIEW COMPARISON:  None. FINDINGS: No fracture is identified. Erosions are seen in the heads of all the metatarsals with overhanging edges. There also appears to be fusion across all the bones of the midfoot. Plantar calcaneal spur is noted. IMPRESSION: No acute abnormality. Ankylosis of the bones the midfoot and IP joint of the great toe. Findings compatible gouty arthropathy involving all of the MTP joints. Electronically Signed   By: Inge Rise M.D.   On: 07/06/2016 11:22   Dg Foot 2 Views Right  Result Date: 07/06/2016 CLINICAL  DATA:  56 year old male with gout in chronic bilateral foot pain. Initial encounter. EXAM: RIGHT FOOT - 2 VIEW COMPARISON:  Left foot films same date dictated separately. No comparison right foot films. FINDINGS: Erosions most notable metatarsal head level (1 through 5) as well as base of the right first proximal phalanx and right fifth proximal phalanx. Findings consistent with patient's history of gout. Superimposed bony overgrowth in joint space narrowing. Fusion midfoot and hindfoot without fracture noted. Spur plantar fascia origin and Achilles tendon insertion. Vascular calcifications. Soft tissue prominence may be related to patient's habitus or dependent edema rather than cellulitis. IMPRESSION: Erosive changes metatarsal heads (1 through 5), right first and fifth proximal phalanx consistent with gouty arthropathy. Fusion midfoot and hindfoot. Electronically Signed   By: Genia Del M.D.   On: 07/06/2016 11:39    Persephonie Hegwood, DO  [facility] Pager [phone]  If 7PM-7AM, please contact night-coverage [email or web address] Password [email or web address] 07/07/2016, 11:44 AM   LOS: 0 days

## 2016-07-08 DIAGNOSIS — L03119 Cellulitis of unspecified part of limb: Secondary | ICD-10-CM

## 2016-07-08 DIAGNOSIS — L02419 Cutaneous abscess of limb, unspecified: Secondary | ICD-10-CM

## 2016-07-08 DIAGNOSIS — R739 Hyperglycemia, unspecified: Secondary | ICD-10-CM

## 2016-07-08 DIAGNOSIS — T50905A Adverse effect of unspecified drugs, medicaments and biological substances, initial encounter: Secondary | ICD-10-CM

## 2016-07-08 DIAGNOSIS — R7302 Impaired glucose tolerance (oral): Secondary | ICD-10-CM

## 2016-07-08 LAB — BASIC METABOLIC PANEL
Anion gap: 7 (ref 5–15)
BUN: 45 mg/dL — AB (ref 6–20)
CO2: 25 mmol/L (ref 22–32)
CREATININE: 1.16 mg/dL (ref 0.61–1.24)
Calcium: 8.1 mg/dL — ABNORMAL LOW (ref 8.9–10.3)
Chloride: 99 mmol/L — ABNORMAL LOW (ref 101–111)
GFR calc Af Amer: >60 mL/min (ref >60)
Glucose, Bld: 333 mg/dL — ABNORMAL HIGH (ref 65–99)
POTASSIUM: 4.5 mmol/L (ref 3.5–5.1)
SODIUM: 131 mmol/L — AB (ref 135–145)

## 2016-07-08 LAB — GLUCOSE, CAPILLARY
GLUCOSE-CAPILLARY: 326 mg/dL — AB (ref 65–99)
GLUCOSE-CAPILLARY: 312 mg/dL — AB (ref 65–99)
GLUCOSE-CAPILLARY: 245 mg/dL — AB (ref 65–99)
Glucose-Capillary: 243 mg/dL — ABNORMAL HIGH (ref 65–99)

## 2016-07-08 LAB — MAGNESIUM: Magnesium: 2.2 mg/dL (ref 1.7–2.4)

## 2016-07-08 MED ORDER — INSULIN ASPART 100 UNIT/ML ~~LOC~~ SOLN
5.0000 [IU] | Freq: Three times a day (TID) | SUBCUTANEOUS | Status: DC
  Administered 2016-07-08 – 2016-07-09 (×4): 5 [IU] via SUBCUTANEOUS

## 2016-07-08 MED ORDER — METHYLPREDNISOLONE SODIUM SUCC 40 MG IJ SOLR
40.0000 mg | Freq: Once | INTRAMUSCULAR | Status: AC
  Administered 2016-07-08: 40 mg via INTRAVENOUS
  Filled 2016-07-08: qty 1

## 2016-07-08 MED ORDER — PREDNISONE 10 MG PO TABS: 60.0000 mg | ORAL_TABLET | Freq: Every day | ORAL | 0 refills | Status: DC

## 2016-07-08 MED ORDER — CEPHALEXIN 500 MG PO CAPS: 500.0000 mg | ORAL_CAPSULE | Freq: Three times a day (TID) | ORAL | 0 refills | Status: DC

## 2016-07-08 MED ORDER — PREDNISONE 20 MG PO TABS
60.0000 mg | ORAL_TABLET | Freq: Every day | ORAL | Status: DC
  Administered 2016-07-09: 60 mg via ORAL
  Filled 2016-07-08: qty 3

## 2016-07-08 MED ORDER — ONDANSETRON 4 MG PO TBDP
4.0000 mg | ORAL_TABLET | Freq: Once | ORAL | Status: AC
  Administered 2016-07-08: 4 mg via ORAL
  Filled 2016-07-08: qty 1

## 2016-07-08 NOTE — Progress Notes (Signed)
Physical Therapy Treatment Patient Details Name: Ronnie Hernandez MRN: [id number] DOB: [birth year] Today's Date: 07/08/2016    History of Present Illness 56 y.o. male with PMH significant for morbid obesity, gout, HTN, HLD and type 2 diabetes; who presented to ED with 1 week of severe/progressive pain in his feet, hands and knees. Patient reports joint pain has been escalating till the point he is not able to move or bear weight. On 07/08/2016 he states he feels alot better     PT Comments    Pt feels much better today and is moving much better, but he still feels weaker than usual.  He still is unable to stand and wants to get more rehab prior to D/C to home so that he regain independence.   Follow Up Recommendations   ST SNF      Equipment Recommendations       Recommendations for Other Services       Precautions / Restrictions Precautions Precautions: Fall Precaution Comments: obesity Restrictions Weight Bearing Restrictions: No    Mobility  Bed Mobility Overal bed mobility: Needs Assistance Bed Mobility: Rolling;Supine to Sit Rolling: Mod assist   Supine to sit: Mod assist Sit to supine: Mod assist   General bed mobility comments: pt is able to move much better in the bed, but still has some difficulty getting up onto his side   Transfers Overall transfer level: Needs assistance Equipment used: Rolling walker (2 wheeled) Transfers: Sit to/from Stand           General transfer comment: Pt unable to get his feet under him to get to standing, but feels that he is moving much better today   Ambulation/Gait             General Gait Details: unable    Stairs            Wheelchair Mobility    Modified Rankin (Stroke Patients Only)       Balance Overall balance assessment: Needs assistance   Sitting balance-Leahy Scale: Good Sitting balance - Comments: pulling up on walker to help with sitting balance                              Cognition Arousal/Alertness: Awake/alert Behavior During Therapy: WFL for tasks assessed/performed Overall Cognitive Status: Within Functional Limits for tasks assessed                      Exercises General Exercises - Upper Extremity Shoulder Flexion: Strengthening;Both (in sagittal  and diagonal patterns ) General Exercises - Lower Extremity Quad Sets: Strengthening;Both;5 reps Gluteal Sets: Strengthening;Both;5 reps Straight Leg Raises: Strengthening;Both;10 reps    General Comments        Pertinent Vitals/Pain Pain Assessment: No/denies pain    Home Living                      Prior Function            PT Goals (current goals can now be found in the care plan section) Acute Rehab PT Goals Patient Stated Goal: return to PLOF Progress towards PT goals: Progressing toward goals    Frequency           PT Plan Current plan remains appropriate    Co-evaluation             End of Session   Activity Tolerance: Patient tolerated treatment well Patient  left: in bed;with call bell/phone within reach     Time: 1050-1125 PT Time Calculation (min) (ACUTE ONLY): 35 min  Charges:  $Therapeutic Exercise: 8-22 mins $Therapeutic Activity: 8-22 mins                    G Codes:     Ronnie Hernandez, PT  Ronnie Hernandez 07/08/2016, 11:29 AM

## 2016-07-08 NOTE — Progress Notes (Signed)
PROGRESS NOTE  Ronnie Hernandez [id number] DOB: Jan 15, 1961 DOA: 07/06/2016 PCP: [name], NP  Brief History:   56 y.o.malewith PMH significant for morbid obesity, gout, HTN, HLD and type 2 diabetes; who presented to ED with 1 week of severe/progressive pain in his feet, hands and knees. Patient reports joint pain has been escalating till the point he is not able to move or bear weight. He was recently started on Uloric for his gout and noticed increase joint pain since initiation of medication. Patient with swollen ankles, severe pain on both feet, hands and knees. 10/10 in intensity, constant and w/o radiation.  Assessment/Plan: Acute gout arthropathy: triggered by initiation of Uloric -continue colchicine -wean solumedrol to po prednisone -check uric acid--4.9 -hold uloric for now -PT consulted--SNF -nutritional service consulted  Hyperlipidemia: -will check lipid panel--LDL 97 -was taken off statins   Morbidly obese (HCC) -low calorie diet, increase exercise and urgent drastic weight loss encouraged. -BMI > 45  Hypertension -continue lisinopril -BP is stable  Impaired glucose tolerance -most recent A1C 5.7 on 06/17/16 -SSI while inpatient  -elevation on CBG's with use of steroids -pt not on any agents oupt  -- "diet controlled" -plan novolog sliding scale for during of prednisone therapy  Cellulitis of left lower extremity -continue keflex  Leukocytosis: demargination from gout attack vs due to cellulitis; most likely combination of both. -treat with steroids and antibiotics -follow WBC's trend  -UA neg for infection and patient no complaining of coughing, fever or SOB.  Gait Instability/uncontrolled pain -PT consulted-->SNF      Disposition Plan:   Home 07/09/16 Family Communication:   No Family at bedside  Consultants:  none  Code Status:  FULL  DVT Prophylaxis:   [place] Lovenox   Procedures: As Listed in  Progress Note Above  Antibiotics: Cephalexin 07/07/16>>>    Subjective: Overall pain is improving. However he is still having some difficulty bearing weight. Denies any fevers, chills, chest pain, nausea, vomiting, diarrhea, abdominal pain. No dysuria or hematuria.  Objective: Vitals:   07/07/16 2042 07/08/16 0530 07/08/16 0937 07/08/16 1450  BP: 128/71 107/75 108/64 135/67  Pulse: 80 82 84 78  Resp: 10 12  18   Temp: 98.5 F (36.9 C) 97.9 F (36.6 C)  97.8 F (36.6 C)  TempSrc: Oral Oral  Oral  SpO2: 98% 98% 98% 95%    Intake/Output Summary (Last 24 hours) at 07/08/16 1532 Last data filed at 07/08/16 1451  Gross per 24 hour  Intake          1781.25 ml  Output             1650 ml  Net           131.25 ml   Weight change:  Exam:   General:  Pt is alert, follows commands appropriately, not in acute distress  HEENT: No icterus, No thrush, No neck mass, [place]/AT  Cardiovascular: RRR, S1/S2, no rubs, no gallops  Respiratory: CTA bilaterally, no wheezing, no crackles, no rhonchi  Abdomen: Soft/+BS, non tender, non distended, no guarding  Extremities: 1+ LE edema, No lymphangitis, No petechiae, No rashes, no synovitis   Data Reviewed: I have personally reviewed following labs and imaging studies Basic Metabolic Panel:  Recent Labs Lab 07/06/16 1159 07/06/16 2004 07/07/16 0359 07/08/16 1424  NA 130*  --  132* 131*  K 4.6  --  5.4* 4.5  CL 95*  --  96* 99*  CO2 25  --  26 25  GLUCOSE 156*  --  251* 333*  BUN 18  --  25* 45*  CREATININE 1.05 0.99 0.98 1.16  CALCIUM 8.3*  --  8.6* 8.1*  MG  --  1.9  --  2.2  PHOS  --  3.4  --   --    Liver Function Tests: No results for input(s): AST, ALT, ALKPHOS, BILITOT, PROT, ALBUMIN in the last 168 hours. No results for input(s): LIPASE, AMYLASE in the last 168 hours. No results for input(s): AMMONIA in the last 168 hours. Coagulation Profile: No results for input(s): INR, PROTIME in the last 168 hours. CBC:  Recent  Labs Lab 07/06/16 1159 07/06/16 2004 07/07/16 0359  WBC 22.6* 18.2* 15.3*  NEUTROABS 19.3*  --   --   HGB 12.5* 11.7* 15.0  HCT 36.7* 35.7* 44.5  MCV 93.4 93.0 93.5  PLT 217 220 177   Cardiac Enzymes: No results for input(s): CKTOTAL, CKMB, CKMBINDEX, TROPONINI in the last 168 hours. BNP: Invalid input(s): POCBNP CBG:  Recent Labs Lab 07/07/16 1203 07/07/16 1733 07/07/16 2047 07/08/16 0738 07/08/16 1150  GLUCAP 269* 260* 269* 326* 312*   HbA1C: No results for input(s): HGBA1C in the last 72 hours. Urine analysis:    Component Value Date/Time   COLORURINE AMBER (A) 07/06/2016 1251   APPEARANCEUR CLEAR 07/06/2016 1251   APPEARANCEUR Clear 12/05/2015 1226   LABSPEC 1.017 07/06/2016 1251   PHURINE 5.0 07/06/2016 1251   GLUCOSEU NEGATIVE 07/06/2016 1251   HGBUR NEGATIVE 07/06/2016 1251   BILIRUBINUR NEGATIVE 07/06/2016 1251   BILIRUBINUR Negative 12/05/2015 1226   KETONESUR NEGATIVE 07/06/2016 1251   PROTEINUR NEGATIVE 07/06/2016 1251   UROBILINOGEN 0.2 12/28/2013 1117   NITRITE NEGATIVE 07/06/2016 1251   LEUKOCYTESUR NEGATIVE 07/06/2016 1251   LEUKOCYTESUR Negative 12/05/2015 1226   Sepsis Labs: @LABRCNTIP (procalcitonin:4,lacticidven:4) ) Recent Results (from the past 240 hour(s))  Urine culture     Status: Abnormal   Collection Time: 07/06/16 12:51 PM  Result Value Ref Range Status   Specimen Description URINE, RANDOM  Final   Special Requests NONE  Final   Culture (A)  Final    <10,000 COLONIES/mL INSIGNIFICANT GROWTH Performed at [facility], [place] [address], [place], [place] [address]    Report Status 07/07/2016 FINAL  Final     Scheduled Meds: . cephALEXin  500 mg Oral Q8H  . colchicine  0.6 mg Oral BID  . enoxaparin (LOVENOX) injection  110 mg Subcutaneous Q24H  . furosemide  40 mg Oral Daily  . insulin aspart  0-20 Units Subcutaneous TID WC  . insulin aspart  0-5 Units Subcutaneous QHS  . insulin aspart  5 Units Subcutaneous TID WC  .  lisinopril  10 mg Oral Daily  . methylPREDNISolone (SOLU-MEDROL) injection  60 mg Intravenous Q12H  . [START ON 07/09/2016] predniSONE  60 mg Oral Q breakfast   Continuous Infusions: . sodium chloride 75 mL/hr at 07/08/16 [id number]    Procedures/Studies: Dg Chest 2 View  Result Date: 06/20/2016 CLINICAL DATA:  Cough and chest congestion over the last 3 days. Fluid in the lower extremities. EXAM: CHEST  2 VIEW COMPARISON:  01/26/2016 FINDINGS: The heart size and mediastinal contours are within normal limits. Both lungs are clear. The visualized skeletal structures are unremarkable. IMPRESSION: No active cardiopulmonary disease. Electronically Signed   By: Nelson Chimes M.D.   On: 06/20/2016 21:49   Dg Foot 2 Views Left  Result Date: 07/06/2016 CLINICAL DATA:  Chronic bilateral foot pain in patient with a history of gout. EXAM: LEFT FOOT - 2 VIEW COMPARISON:  None. FINDINGS: No fracture is identified. Erosions are seen in the heads of all the metatarsals with overhanging edges. There also appears to be fusion across all the bones of the midfoot. Plantar calcaneal spur is noted. IMPRESSION: No acute abnormality. Ankylosis of the bones the midfoot and IP joint of the great toe. Findings compatible gouty arthropathy involving all of the MTP joints. Electronically Signed   By: Inge Rise M.D.   On: 07/06/2016 11:22   Dg Foot 2 Views Right  Result Date: 07/06/2016 CLINICAL DATA:  56 year old male with gout in chronic bilateral foot pain. Initial encounter. EXAM: RIGHT FOOT - 2 VIEW COMPARISON:  Left foot films same date dictated separately. No comparison right foot films. FINDINGS: Erosions most notable metatarsal head level (1 through 5) as well as base of the right first proximal phalanx and right fifth proximal phalanx. Findings consistent with patient's history of gout. Superimposed bony overgrowth in joint space narrowing. Fusion midfoot and hindfoot without fracture noted. Spur plantar fascia  origin and Achilles tendon insertion. Vascular calcifications. Soft tissue prominence may be related to patient's habitus or dependent edema rather than cellulitis. IMPRESSION: Erosive changes metatarsal heads (1 through 5), right first and fifth proximal phalanx consistent with gouty arthropathy. Fusion midfoot and hindfoot. Electronically Signed   By: Genia Del M.D.   On: 07/06/2016 11:39    Breahna Boylen, DO  [facility] Pager [phone]  If 7PM-7AM, please contact night-coverage [email or web address] Password [email or web address] 07/08/2016, 3:32 PM   LOS: 1 day

## 2016-07-08 NOTE — Discharge Summary (Signed)
Physician Discharge Summary  Ronnie Hernandez [id number] DOB: Jan 31, 1961 DOA: 07/06/2016  PCP: [name], NP  Admit date: 07/06/2016 Discharge date: 07/09/16  Admitted From: Home Disposition:  SNF  Recommendations for Outpatient Follow-up:  1. Follow up with PCP in 1-2 weeks 2. Please obtain BMP/CBC in one week 3. Please check CBGs before each meal and at bedtime and give sliding scale insulin as below 4. Stop CBG checks and sliding scale insulin when finished with prednisone     Discharge Condition: Stable CODE STATUS:FULL Diet recommendation: Heart Healthy  Brief/Interim Summary: 56 y.o.malewith PMH significant for morbid obesity, gout, HTN, HLD and type 2 diabetes; who presented to ED with 1 week of severe/progressive pain in his feet, hands and knees. Patient reports joint pain has been escalating till the point he is not able to move or bear weight. He was recently started on Uloric for his gout and noticed increase joint pain since initiation of medication. Patient with swollen ankles, severe pain on both feet, hands and knees. 10/10 in intensity, constant and w/o radiation. The patient was started on intravenous Solu-Medrol with clinical improvement. Physical therapy was consulted. Although the patient had increasing mobility, he still had difficulty bearing weight secondary to pain. PT recommended skilled nursing facility to which the patient will be discharged. He'll be discharged with prednisone taper. He'll also continue on a NovoLog sliding scale until his prednisone taper is finished.  Discharge Diagnoses:  Acute gout arthropathy: ?triggered by initiation of Uloric -continue colchicine -wean solumedrol to po prednisone--d/c with taper over next 6 days -check uric acid--4.9 -hold uloric for now--would hold for 1 to 2 weeks after acute flare before restarting -he will need to follow up with his PCP to determine risks/benefits of restarting -PT  consulted--SNF -nutritional service consulted  Hyperlipidemia: -will check lipid panel--LDL 97 -was taken off statins   Morbidly obese (HCC) -low calorie diet, increase exercise and urgent drastic weight loss encouraged. -BMI > 45  Hypertension -discontinue lisinopril as K is trending up and initial BP elevation likely due to pain -BP is stable  Impaired glucose tolerance -most recent A1C 5.7 on 06/17/16 -SSI while inpatient  -elevation on CBG's with use of steroids -pt not on any agents oupt -- "diet controlled" -plan novolog sliding scale for during of prednisone therapy--can discontinue after prednisone is finished  Cellulitis of left lower extremity -continuekeflex x 4 more days after d/c to complete one week of therapy  Leukocytosis: demargination from gout attack vs due to cellulitis; most likely combination of both. -treat with steroids and antibiotics -follow WBC's trend  -UA neg for infection and patient no complaining of coughing, fever or SOB.  Gait Instability/uncontrolled pain -PT consulted-->SNF   Discharge Instructions   Allergies as of 07/09/2016   No Known Allergies     Medication List    STOP taking these medications   Febuxostat 80 MG Tabs   potassium chloride 10 MEQ tablet Commonly known as:  K-DUR     TAKE these medications   albuterol 108 (90 Base) MCG/ACT inhaler Commonly known as:  PROVENTIL HFA;VENTOLIN HFA Inhale 1-2 puffs into the lungs every 6 (six) hours as needed for wheezing or shortness of breath.   cephALEXin 500 MG capsule Commonly known as:  KEFLEX Take 1 capsule (500 mg total) by mouth every 8 (eight) hours.   colchicine 0.6 MG tablet TAKE 1 TABLET (0.6 MG TOTAL) BY MOUTH 2 (TWO) TIMES DAILY as needed for gouty attack/pain What changed:  See  the new instructions.   furosemide 20 MG tablet Commonly known as:  LASIX Take 2 tablets (40 mg total) by mouth daily.   insulin aspart 100 UNIT/ML injection Commonly  known as:  novoLOG CBG 70-120--no units; 121-150--3 units; 151-200--4 units; 201-250--7 units; 251-300--11 units; 301-350--15 units; 351-400--20 units; >400--call MD CHECK CBG before each meal   insulin aspart 100 UNIT/ML injection Commonly known as:  novoLOG CHECK CBGs at bedtime CBG 0-200--NO units; 201-250--2 units; 251-300--3 units; 301-350--4 units; 351-400--5 units; >400--call MD   predniSONE 10 MG tablet Commonly known as:  DELTASONE Take 6 tablets (60 mg total) by mouth daily with breakfast. And decrease by one tablet daily Start taking on:  07/10/2016      Contact information for after-discharge care    [facility] .   Specialty:  [place] Contact information: [address] [place] [phone]             No Known Allergies  Consultations:  none   Procedures/Studies: Dg Chest 2 View  Result Date: 06/20/2016 CLINICAL DATA:  Cough and chest congestion over the last 3 days. Fluid in the lower extremities. EXAM: CHEST  2 VIEW COMPARISON:  01/26/2016 FINDINGS: The heart size and mediastinal contours are within normal limits. Both lungs are clear. The visualized skeletal structures are unremarkable. IMPRESSION: No active cardiopulmonary disease. Electronically Signed   By: Nelson Chimes M.D.   On: 06/20/2016 21:49   Dg Foot 2 Views Left  Result Date: 07/06/2016 CLINICAL DATA:  Chronic bilateral foot pain in patient with a history of gout. EXAM: LEFT FOOT - 2 VIEW COMPARISON:  None. FINDINGS: No fracture is identified. Erosions are seen in the heads of all the metatarsals with overhanging edges. There also appears to be fusion across all the bones of the midfoot. Plantar calcaneal spur is noted. IMPRESSION: No acute abnormality. Ankylosis of the bones the midfoot and IP joint of the great toe. Findings compatible gouty arthropathy involving all of the MTP joints. Electronically Signed   By:  Inge Rise M.D.   On: 07/06/2016 11:22   Dg Foot 2 Views Right  Result Date: 07/06/2016 CLINICAL DATA:  56 year old male with gout in chronic bilateral foot pain. Initial encounter. EXAM: RIGHT FOOT - 2 VIEW COMPARISON:  Left foot films same date dictated separately. No comparison right foot films. FINDINGS: Erosions most notable metatarsal head level (1 through 5) as well as base of the right first proximal phalanx and right fifth proximal phalanx. Findings consistent with patient's history of gout. Superimposed bony overgrowth in joint space narrowing. Fusion midfoot and hindfoot without fracture noted. Spur plantar fascia origin and Achilles tendon insertion. Vascular calcifications. Soft tissue prominence may be related to patient's habitus or dependent edema rather than cellulitis. IMPRESSION: Erosive changes metatarsal heads (1 through 5), right first and fifth proximal phalanx consistent with gouty arthropathy. Fusion midfoot and hindfoot. Electronically Signed   By: Genia Del M.D.   On: 07/06/2016 11:39        Discharge Exam: Vitals:   07/08/16 2158 07/09/16 0519  BP: 132/80 131/85  Pulse: 75 72  Resp: 18 16  Temp: 97.7 F (36.5 C) 97.9 F (36.6 C)   Vitals:   07/08/16 1450 07/08/16 2000 07/08/16 2158 07/09/16 0519  BP: 135/67  132/80 131/85  Pulse: 78  75 72  Resp: 18  18 16   Temp: 97.8 F (36.6 C)  97.7 F (36.5 C)  97.9 F (36.6 C)  TempSrc: Oral  Oral Oral  SpO2: 95%  98% 97%  Height:  5\' 7"  (1.702 m)      General: Pt is alert, awake, not in acute distress Cardiovascular: RRR, S1/S2 +, no rubs, no gallops Respiratory: diminished BS at bases but CTA bilaterally, no wheezing, no rhonchi Abdominal: Soft, NT, ND, bowel sounds + Extremities: 1 + LE edema, no cyanosis   The results of significant diagnostics from this hospitalization (including imaging, microbiology, ancillary and laboratory) are listed below for reference.    Significant Diagnostic  Studies: Dg Chest 2 View  Result Date: 06/20/2016 CLINICAL DATA:  Cough and chest congestion over the last 3 days. Fluid in the lower extremities. EXAM: CHEST  2 VIEW COMPARISON:  01/26/2016 FINDINGS: The heart size and mediastinal contours are within normal limits. Both lungs are clear. The visualized skeletal structures are unremarkable. IMPRESSION: No active cardiopulmonary disease. Electronically Signed   By: Nelson Chimes M.D.   On: 06/20/2016 21:49   Dg Foot 2 Views Left  Result Date: 07/06/2016 CLINICAL DATA:  Chronic bilateral foot pain in patient with a history of gout. EXAM: LEFT FOOT - 2 VIEW COMPARISON:  None. FINDINGS: No fracture is identified. Erosions are seen in the heads of all the metatarsals with overhanging edges. There also appears to be fusion across all the bones of the midfoot. Plantar calcaneal spur is noted. IMPRESSION: No acute abnormality. Ankylosis of the bones the midfoot and IP joint of the great toe. Findings compatible gouty arthropathy involving all of the MTP joints. Electronically Signed   By: Inge Rise M.D.   On: 07/06/2016 11:22   Dg Foot 2 Views Right  Result Date: 07/06/2016 CLINICAL DATA:  56 year old male with gout in chronic bilateral foot pain. Initial encounter. EXAM: RIGHT FOOT - 2 VIEW COMPARISON:  Left foot films same date dictated separately. No comparison right foot films. FINDINGS: Erosions most notable metatarsal head level (1 through 5) as well as base of the right first proximal phalanx and right fifth proximal phalanx. Findings consistent with patient's history of gout. Superimposed bony overgrowth in joint space narrowing. Fusion midfoot and hindfoot without fracture noted. Spur plantar fascia origin and Achilles tendon insertion. Vascular calcifications. Soft tissue prominence may be related to patient's habitus or dependent edema rather than cellulitis. IMPRESSION: Erosive changes metatarsal heads (1 through 5), right first and fifth  proximal phalanx consistent with gouty arthropathy. Fusion midfoot and hindfoot. Electronically Signed   By: Genia Del M.D.   On: 07/06/2016 11:39     Microbiology: Recent Results (from the past 240 hour(s))  Urine culture     Status: Abnormal   Collection Time: 07/06/16 12:51 PM  Result Value Ref Range Status   Specimen Description URINE, RANDOM  Final   Special Requests NONE  Final   Culture (A)  Final    <10,000 COLONIES/mL INSIGNIFICANT GROWTH Performed at [facility], [address] [place], [place] [address]    Report Status 07/07/2016 FINAL  Final     Labs: Basic Metabolic Panel:  Recent Labs Lab 07/06/16 1159 07/06/16 2004 07/07/16 0359 07/08/16 1424 07/09/16 0429  NA 130*  --  132* 131* 133*  K 4.6  --  5.4* 4.5 5.0  CL 95*  --  96* 99* 101  CO2 25  --  26 25 25   GLUCOSE 156*  --  251* 333* 264*  BUN 18  --  25* 45* 43*  CREATININE 1.05 0.99  0.98 1.16 1.01  CALCIUM 8.3*  --  8.6* 8.1* 7.9*  MG  --  1.9  --  2.2  --   PHOS  --  3.4  --   --   --    Liver Function Tests: No results for input(s): AST, ALT, ALKPHOS, BILITOT, PROT, ALBUMIN in the last 168 hours. No results for input(s): LIPASE, AMYLASE in the last 168 hours. No results for input(s): AMMONIA in the last 168 hours. CBC:  Recent Labs Lab 07/06/16 1159 07/06/16 2004 07/07/16 0359  WBC 22.6* 18.2* 15.3*  NEUTROABS 19.3*  --   --   HGB 12.5* 11.7* 15.0  HCT 36.7* 35.7* 44.5  MCV 93.4 93.0 93.5  PLT 217 220 177   Cardiac Enzymes: No results for input(s): CKTOTAL, CKMB, CKMBINDEX, TROPONINI in the last 168 hours. BNP: Invalid input(s): POCBNP CBG:  Recent Labs Lab 07/08/16 1150 07/08/16 1712 07/08/16 2157 07/09/16 0750 07/09/16 1148  GLUCAP 312* 243* 245* 245* 237*    Time coordinating discharge:  Greater than 30 minutes  Signed:  Emmalin Jaquess, DO [facility] Pager: [phone] 07/09/2016, 12:16 PM

## 2016-07-08 NOTE — Progress Notes (Signed)
CSW met with pt at bedside to provide SNF bed offer. Pt has accepted SNF offer from [facility]. SNF has requested authorization from [employer]. A decision is pending. CSW will continue to follow to assist with d/c planning to SNF.  Werner Lean LCSW [phone]

## 2016-07-09 DIAGNOSIS — L03116 Cellulitis of left lower limb: Secondary | ICD-10-CM | POA: Diagnosis not present

## 2016-07-09 DIAGNOSIS — M1000 Idiopathic gout, unspecified site: Secondary | ICD-10-CM | POA: Diagnosis not present

## 2016-07-09 DIAGNOSIS — E0865 Diabetes mellitus due to underlying condition with hyperglycemia: Secondary | ICD-10-CM | POA: Diagnosis not present

## 2016-07-09 DIAGNOSIS — R2681 Unsteadiness on feet: Secondary | ICD-10-CM | POA: Diagnosis not present

## 2016-07-09 DIAGNOSIS — R739 Hyperglycemia, unspecified: Secondary | ICD-10-CM | POA: Diagnosis not present

## 2016-07-09 DIAGNOSIS — T50905A Adverse effect of unspecified drugs, medicaments and biological substances, initial encounter: Secondary | ICD-10-CM | POA: Diagnosis not present

## 2016-07-09 DIAGNOSIS — M109 Gout, unspecified: Secondary | ICD-10-CM | POA: Diagnosis not present

## 2016-07-09 DIAGNOSIS — E669 Obesity, unspecified: Secondary | ICD-10-CM | POA: Diagnosis not present

## 2016-07-09 DIAGNOSIS — E785 Hyperlipidemia, unspecified: Secondary | ICD-10-CM | POA: Diagnosis not present

## 2016-07-09 DIAGNOSIS — L03119 Cellulitis of unspecified part of limb: Secondary | ICD-10-CM | POA: Diagnosis not present

## 2016-07-09 DIAGNOSIS — E782 Mixed hyperlipidemia: Secondary | ICD-10-CM | POA: Diagnosis not present

## 2016-07-09 DIAGNOSIS — D72829 Elevated white blood cell count, unspecified: Secondary | ICD-10-CM | POA: Diagnosis not present

## 2016-07-09 DIAGNOSIS — E784 Other hyperlipidemia: Secondary | ICD-10-CM | POA: Diagnosis not present

## 2016-07-09 DIAGNOSIS — R269 Unspecified abnormalities of gait and mobility: Secondary | ICD-10-CM | POA: Diagnosis not present

## 2016-07-09 DIAGNOSIS — R7302 Impaired glucose tolerance (oral): Secondary | ICD-10-CM | POA: Diagnosis not present

## 2016-07-09 DIAGNOSIS — I159 Secondary hypertension, unspecified: Secondary | ICD-10-CM | POA: Diagnosis not present

## 2016-07-09 LAB — BASIC METABOLIC PANEL
ANION GAP: 7 (ref 5–15)
BUN: 43 mg/dL — ABNORMAL HIGH (ref 6–20)
CHLORIDE: 101 mmol/L (ref 101–111)
CO2: 25 mmol/L (ref 22–32)
Calcium: 7.9 mg/dL — ABNORMAL LOW (ref 8.9–10.3)
Creatinine, Ser: 1.01 mg/dL (ref 0.61–1.24)
GFR calc non Af Amer: >60 mL/min (ref >60)
Glucose, Bld: 264 mg/dL — ABNORMAL HIGH (ref 65–99)
Potassium: 5 mmol/L (ref 3.5–5.1)
Sodium: 133 mmol/L — ABNORMAL LOW (ref 135–145)

## 2016-07-09 LAB — GLUCOSE, CAPILLARY
GLUCOSE-CAPILLARY: 190 mg/dL — AB (ref 65–99)
Glucose-Capillary: 245 mg/dL — ABNORMAL HIGH (ref 65–99)
Glucose-Capillary: 237 mg/dL — ABNORMAL HIGH (ref 65–99)

## 2016-07-09 LAB — HEMOGLOBIN A1C
Hgb A1c MFr Bld: 6 % — ABNORMAL HIGH (ref 4.8–5.6)
Mean Plasma Glucose: 126 mg/dL

## 2016-07-09 MED ORDER — INSULIN ASPART 100 UNIT/ML ~~LOC~~ SOLN: SUBCUTANEOUS | 0 refills | Status: DC

## 2016-07-09 MED ORDER — ONDANSETRON 4 MG PO TBDP
4.0000 mg | ORAL_TABLET | Freq: Three times a day (TID) | ORAL | Status: DC | PRN
  Administered 2016-07-09: 4 mg via ORAL
  Filled 2016-07-09: qty 1

## 2016-07-09 MED ORDER — COLCHICINE 0.6 MG PO TABS: ORAL_TABLET | ORAL | 3 refills | Status: DC

## 2016-07-09 MED ORDER — PREDNISONE 10 MG PO TABS: 60.0000 mg | ORAL_TABLET | Freq: Every day | ORAL | 0 refills | Status: DC

## 2016-07-09 NOTE — Progress Notes (Signed)
CSW following to assist with SNF placement. [place] has received SNF authorization from [facility] and is able to admit pt today if stable for d/c. CSW will follow to assist with d/c planning to SNF.  Werner Lean LCSW [phone]

## 2016-07-09 NOTE — Progress Notes (Signed)
Facility confirmed equipment has arrived at facility.  [facility] called for transport-3 hour wait.  RN notified. Confirmed packet is on chart-nurse given report.  No other needs identified.

## 2016-07-09 NOTE — Clinical Social Work Placement (Signed)
   CLINICAL SOCIAL WORK PLACEMENT  NOTE  Date:  07/09/2016  Patient Details  Name: Ronnie Hernandez MRN: [id number] Date of Birth: 1960/08/28  Clinical Social Work is seeking post-discharge placement for this patient at the [place] level of care (*CSW will initial, date and re-position this form in  chart as items are completed):  Yes   Patient/family provided with [facility] Work Department's list of facilities offering this level of care within the geographic area requested by the patient (or if unable, by the patient's family).  Yes   Patient/family informed of their freedom to choose among providers that offer the needed level of care, that participate in Medicare, Medicaid or managed care program needed by the patient, have an available bed and are willing to accept the patient.  Yes   Patient/family informed of [facility]'s ownership interest in [facility] and [facility], as well as of the fact that they are under no obligation to receive care at these facilities.  PASRR submitted to EDS on       PASRR number received on       Existing PASRR number confirmed on 07/07/16     FL2 transmitted to all facilities in geographic area requested by pt/family on 07/07/16     FL2 transmitted to all facilities within larger geographic area on       Patient informed that his/her managed care company has contracts with or will negotiate with certain facilities, including the following:        Yes   Patient/family informed of bed offers received.  Patient chooses bed at [facility] and [facility] recommends and patient chooses bed at      Patient to be transferred to [facility] on 07/09/16.  Patient to be transferred to facility by [facility]     Patient family notified on 07/09/16 of transfer.  Name of family member notified:  Pt reports he will contact family directly.     PHYSICIAN       Additional Comment:  Pt is in agreement with d/c to [facility] today. [facility] transport is needed. Medical necessity form completed. Pt is aware out of pocket costs may be associated with [facility] transport. Scripts included in d/c packet. # for report provided to nsg.   _______________________________________________ Luretha Rued, [place]  [phone] 07/09/2016, 12:28 PM

## 2016-07-12 DIAGNOSIS — R7302 Impaired glucose tolerance (oral): Secondary | ICD-10-CM | POA: Diagnosis not present

## 2016-07-12 DIAGNOSIS — M109 Gout, unspecified: Secondary | ICD-10-CM | POA: Diagnosis not present

## 2016-07-12 DIAGNOSIS — E782 Mixed hyperlipidemia: Secondary | ICD-10-CM | POA: Diagnosis not present

## 2016-07-12 DIAGNOSIS — L03116 Cellulitis of left lower limb: Secondary | ICD-10-CM | POA: Diagnosis not present

## 2016-07-19 ENCOUNTER — Telehealth: Payer: Self-pay | Admitting: Internal Medicine

## 2016-07-19 DIAGNOSIS — R7302 Impaired glucose tolerance (oral): Secondary | ICD-10-CM | POA: Diagnosis not present

## 2016-07-19 DIAGNOSIS — E782 Mixed hyperlipidemia: Secondary | ICD-10-CM | POA: Diagnosis not present

## 2016-07-19 DIAGNOSIS — M109 Gout, unspecified: Secondary | ICD-10-CM | POA: Diagnosis not present

## 2016-07-19 NOTE — Telephone Encounter (Signed)
Pt states he was in the hospital and is now in a rehab facility. Does not think he will be out in time for his hospital procedure. Pt requests to cancel this procedure and states he will call back to reschedule when he can. Appt cancelled for 08/10/16 at [facility]. Quintin Alto CMA notified and Dr. Hilarie Fredrickson aware.

## 2016-07-20 ENCOUNTER — Telehealth: Payer: Self-pay | Admitting: Nurse Practitioner

## 2016-07-20 NOTE — Telephone Encounter (Signed)
left msg asking pt to confirm this [facility] appt w/ nurse. advised we could also move labs to this day if needed. VDM (DD)

## 2016-07-23 DIAGNOSIS — M109 Gout, unspecified: Secondary | ICD-10-CM | POA: Diagnosis not present

## 2016-07-23 DIAGNOSIS — L03116 Cellulitis of left lower limb: Secondary | ICD-10-CM | POA: Diagnosis not present

## 2016-07-23 DIAGNOSIS — R7302 Impaired glucose tolerance (oral): Secondary | ICD-10-CM | POA: Diagnosis not present

## 2016-07-23 DIAGNOSIS — R2681 Unsteadiness on feet: Secondary | ICD-10-CM | POA: Diagnosis not present

## 2016-07-28 ENCOUNTER — Encounter: Payer: Medicare HMO | Admitting: Internal Medicine

## 2016-07-29 ENCOUNTER — Ambulatory Visit: Payer: Medicare HMO

## 2016-07-30 ENCOUNTER — Other Ambulatory Visit: Payer: Medicare HMO

## 2016-08-02 ENCOUNTER — Other Ambulatory Visit: Payer: Medicare HMO

## 2016-08-02 ENCOUNTER — Ambulatory Visit: Payer: Medicare HMO | Admitting: Nurse Practitioner

## 2016-08-05 ENCOUNTER — Observation Stay (HOSPITAL_COMMUNITY)
Admission: EM | Admit: 2016-08-05 | Discharge: 2016-08-08 | Disposition: A | Discharge status: Home / Self Care | Payer: Medicare HMO | Attending: Internal Medicine | Admitting: Internal Medicine

## 2016-08-05 ENCOUNTER — Encounter (HOSPITAL_COMMUNITY): Payer: Self-pay

## 2016-08-05 ENCOUNTER — Emergency Department (HOSPITAL_COMMUNITY): Payer: Medicare HMO

## 2016-08-05 DIAGNOSIS — Z794 Long term (current) use of insulin: Secondary | ICD-10-CM | POA: Insufficient documentation

## 2016-08-05 DIAGNOSIS — M170 Bilateral primary osteoarthritis of knee: Secondary | ICD-10-CM | POA: Diagnosis not present

## 2016-08-05 DIAGNOSIS — E1165 Type 2 diabetes mellitus with hyperglycemia: Secondary | ICD-10-CM

## 2016-08-05 DIAGNOSIS — E119 Type 2 diabetes mellitus without complications: Secondary | ICD-10-CM | POA: Diagnosis not present

## 2016-08-05 DIAGNOSIS — R262 Difficulty in walking, not elsewhere classified: Secondary | ICD-10-CM

## 2016-08-05 DIAGNOSIS — Z6841 Body Mass Index (BMI) 40.0 and over, adult: Secondary | ICD-10-CM | POA: Diagnosis not present

## 2016-08-05 DIAGNOSIS — I119 Hypertensive heart disease without heart failure: Secondary | ICD-10-CM | POA: Diagnosis not present

## 2016-08-05 DIAGNOSIS — I2699 Other pulmonary embolism without acute cor pulmonale: Secondary | ICD-10-CM | POA: Diagnosis not present

## 2016-08-05 DIAGNOSIS — I10 Essential (primary) hypertension: Secondary | ICD-10-CM | POA: Diagnosis present

## 2016-08-05 DIAGNOSIS — M19041 Primary osteoarthritis, right hand: Secondary | ICD-10-CM | POA: Diagnosis not present

## 2016-08-05 DIAGNOSIS — M109 Gout, unspecified: Secondary | ICD-10-CM | POA: Diagnosis not present

## 2016-08-05 DIAGNOSIS — Z888 Allergy status to other drugs, medicaments and biological substances status: Secondary | ICD-10-CM | POA: Diagnosis not present

## 2016-08-05 DIAGNOSIS — D696 Thrombocytopenia, unspecified: Secondary | ICD-10-CM | POA: Insufficient documentation

## 2016-08-05 DIAGNOSIS — R05 Cough: Secondary | ICD-10-CM | POA: Diagnosis not present

## 2016-08-05 DIAGNOSIS — R269 Unspecified abnormalities of gait and mobility: Secondary | ICD-10-CM | POA: Insufficient documentation

## 2016-08-05 DIAGNOSIS — E785 Hyperlipidemia, unspecified: Secondary | ICD-10-CM | POA: Insufficient documentation

## 2016-08-05 DIAGNOSIS — R0602 Shortness of breath: Secondary | ICD-10-CM | POA: Diagnosis not present

## 2016-08-05 LAB — CBC
HEMATOCRIT: 40.7 % (ref 39.0–52.0)
Hemoglobin: 13.3 g/dL (ref 13.0–17.0)
MCH: 31.1 pg (ref 26.0–34.0)
MCHC: 32.7 g/dL (ref 30.0–36.0)
MCV: 95.1 fL (ref 78.0–100.0)
Platelets: 132 10*3/uL — ABNORMAL LOW (ref 150–400)
RBC: 4.28 MIL/uL (ref 4.22–5.81)
RDW: 15 % (ref 11.5–15.5)
WBC: 7.7 10*3/uL (ref 4.0–10.5)

## 2016-08-05 LAB — BASIC METABOLIC PANEL
Anion gap: 9 (ref 5–15)
BUN: 10 mg/dL (ref 6–20)
CHLORIDE: 102 mmol/L (ref 101–111)
CO2: 30 mmol/L (ref 22–32)
Calcium: 8.8 mg/dL — ABNORMAL LOW (ref 8.9–10.3)
Creatinine, Ser: 1.06 mg/dL (ref 0.61–1.24)
GFR calc Af Amer: >60 mL/min (ref >60)
GFR calc non Af Amer: >60 mL/min (ref >60)
Glucose, Bld: 130 mg/dL — ABNORMAL HIGH (ref 65–99)
POTASSIUM: 3.8 mmol/L (ref 3.5–5.1)
SODIUM: 141 mmol/L (ref 135–145)

## 2016-08-05 LAB — I-STAT TROPONIN, ED: Troponin i, poc: 0 ng/mL (ref 0.00–0.08)

## 2016-08-05 LAB — BRAIN NATRIURETIC PEPTIDE: B NATRIURETIC PEPTIDE 5: 15.2 pg/mL (ref 0.0–100.0)

## 2016-08-05 LAB — GLUCOSE, CAPILLARY: Glucose-Capillary: 126 mg/dL — ABNORMAL HIGH (ref 65–99)

## 2016-08-05 LAB — TROPONIN I: Troponin I: <0.03 ng/mL (ref <0.03)

## 2016-08-05 MED ORDER — ACETAMINOPHEN 650 MG RE SUPP: 650.0000 mg | Freq: Four times a day (QID) | RECTAL | Status: DC | PRN

## 2016-08-05 MED ORDER — ONDANSETRON HCL 4 MG PO TABS: 4.0000 mg | ORAL_TABLET | Freq: Four times a day (QID) | ORAL | Status: DC | PRN

## 2016-08-05 MED ORDER — INSULIN ASPART 100 UNIT/ML ~~LOC~~ SOLN
0.0000 [IU] | Freq: Three times a day (TID) | SUBCUTANEOUS | Status: DC
  Administered 2016-08-06 – 2016-08-07 (×3): 1 [IU] via SUBCUTANEOUS
  Administered 2016-08-08: 2 [IU] via SUBCUTANEOUS

## 2016-08-05 MED ORDER — ALBUTEROL SULFATE (2.5 MG/3ML) 0.083% IN NEBU
5.0000 mg | INHALATION_SOLUTION | Freq: Once | RESPIRATORY_TRACT | Status: AC
  Administered 2016-08-05: 5 mg via RESPIRATORY_TRACT
  Filled 2016-08-05: qty 6

## 2016-08-05 MED ORDER — HEPARIN BOLUS VIA INFUSION
5000.0000 [IU] | Freq: Once | INTRAVENOUS | Status: AC
  Administered 2016-08-05: 5000 [IU] via INTRAVENOUS
  Filled 2016-08-05: qty 5000

## 2016-08-05 MED ORDER — HYDROCODONE-ACETAMINOPHEN 5-325 MG PO TABS: 1.0000 | ORAL_TABLET | Freq: Four times a day (QID) | ORAL | Status: DC | PRN

## 2016-08-05 MED ORDER — ONDANSETRON HCL 4 MG/2ML IJ SOLN
4.0000 mg | Freq: Four times a day (QID) | INTRAMUSCULAR | Status: DC | PRN
Start: 2016-08-05 — End: 2016-08-08

## 2016-08-05 MED ORDER — SODIUM CHLORIDE 0.9 % IV SOLN
INTRAVENOUS | Status: DC
Start: 2016-08-05 — End: 2016-08-07
  Administered 2016-08-05 – 2016-08-07 (×3): via INTRAVENOUS

## 2016-08-05 MED ORDER — COLCHICINE 0.6 MG PO TABS: 0.6000 mg | ORAL_TABLET | Freq: Two times a day (BID) | ORAL | Status: DC | PRN

## 2016-08-05 MED ORDER — ALBUTEROL SULFATE (2.5 MG/3ML) 0.083% IN NEBU
2.5000 mg | INHALATION_SOLUTION | RESPIRATORY_TRACT | Status: DC | PRN
  Administered 2016-08-06 – 2016-08-08 (×9): 2.5 mg via RESPIRATORY_TRACT
  Filled 2016-08-05 (×9): qty 3

## 2016-08-05 MED ORDER — ACETAMINOPHEN 325 MG PO TABS: 650.0000 mg | ORAL_TABLET | Freq: Four times a day (QID) | ORAL | Status: DC | PRN

## 2016-08-05 MED ORDER — FUROSEMIDE 40 MG PO TABS
40.0000 mg | ORAL_TABLET | Freq: Every day | ORAL | Status: DC
  Administered 2016-08-06 – 2016-08-08 (×3): 40 mg via ORAL
  Filled 2016-08-05 (×3): qty 1

## 2016-08-05 MED ORDER — IOPAMIDOL (ISOVUE-370) INJECTION 76%
INTRAVENOUS | Status: AC
  Filled 2016-08-05: qty 100

## 2016-08-05 MED ORDER — HEPARIN (PORCINE) IN NACL 100-0.45 UNIT/ML-% IJ SOLN
2200.0000 [IU]/h | INTRAMUSCULAR | Status: DC
  Administered 2016-08-05 – 2016-08-06 (×2): 2200 [IU]/h via INTRAVENOUS
  Filled 2016-08-05 (×3): qty 250

## 2016-08-05 NOTE — ED Triage Notes (Signed)
PER EMS: pt from home, reports exertional SOB with fluid retention associated with productive cough onset today. Denies any pain or hx of CHF.

## 2016-08-05 NOTE — ED Provider Notes (Signed)
[facility] DEPT Provider Note   CSN: [id number] Arrival date & time: 08/05/16  1521    History   Chief Complaint Chief Complaint  Patient presents with  . Shortness of Breath    HPI Ronnie Hernandez is a 56 y.o. male.  The history is provided by the patient.  Shortness of Breath  This is a new problem. The average episode lasts 2 days. The problem occurs intermittently.The current episode started yesterday. The problem has been gradually worsening. Associated symptoms include cough and leg swelling. Pertinent negatives include no fever, no sore throat, no ear pain, no chest pain, no vomiting, no abdominal pain and no rash. Risk factors include recent prolonged sitting (Recently in rehab for gout flare and decreased mobility). He has tried nothing for the symptoms.    Past Medical History:  Diagnosis Date  . Arthritis    right fingers, both knees  . Diabetes type 2, uncontrolled ([place])   . Gout of hand   . Hyperlipidemia   . Hypertension   . Morbidly obese [facility])     Patient Active Problem List   Diagnosis Date Noted  . Pulmonary embolus ([place]) 08/05/2016  . Hyperglycemia, drug-induced 07/08/2016  . Impaired glucose tolerance 07/08/2016  . Leukocytosis 07/06/2016  . Unable to walk 07/06/2016  . Acute gouty arthropathy 07/06/2016  . Abnormality of gait 02/18/2016  . Cellulitis of left lower extremity 01/27/2016  . Type 2 diabetes mellitus ([place]) 12/25/2013  . Drug-induced gout 12/25/2013  . Hyperlipidemia 08/01/2013  . Morbidly obese ([place])   . Hypertension   . Diabetes type 2, uncontrolled ([place])   . Gout   . Colon cancer screening 07/27/2011  . Benign neoplasm of colon 07/27/2011    Past Surgical History:  Procedure Laterality Date  . unremarkable         Home Medications    Prior to Admission medications   Medication Sig Start Date End Date Taking? Authorizing Provider  albuterol (PROVENTIL HFA;VENTOLIN HFA) 108 (90 Base) MCG/ACT inhaler Inhale 1-2  puffs into the lungs every 6 (six) hours as needed for wheezing or shortness of breath.   Yes Historical Provider, MD  colchicine 0.6 MG tablet TAKE 1 TABLET (0.6 MG TOTAL) BY MOUTH 2 (TWO) TIMES DAILY as needed for gouty attack/pain 07/09/16  Yes Orson Eva, MD  furosemide (LASIX) 20 MG tablet Take 2 tablets (40 mg total) by mouth daily. 03/01/16  Yes [name], NP  cephALEXin (KEFLEX) 500 MG capsule Take 1 capsule (500 mg total) by mouth every 8 (eight) hours. Patient not taking: Reported on 08/05/2016 07/08/16   Orson Eva, MD  insulin aspart (NOVOLOG) 100 UNIT/ML injection CBG 70-120--no units; 121-150--3 units; 151-200--4 units; 201-250--7 units; 251-300--11 units; 301-350--15 units; 351-400--20 units; >400--call MD CHECK CBG before each meal Patient not taking: Reported on 08/05/2016 07/09/16   Orson Eva, MD  insulin aspart (NOVOLOG) 100 UNIT/ML injection CHECK CBGs at bedtime CBG 0-200--NO units; 201-250--2 units; 251-300--3 units; 301-350--4 units; 351-400--5 units; >400--call MD Patient not taking: Reported on 08/05/2016 07/09/16   Orson Eva, MD  predniSONE (DELTASONE) 10 MG tablet Take 6 tablets (60 mg total) by mouth daily with breakfast. And decrease by one tablet daily Patient not taking: Reported on 08/05/2016 07/10/16   Orson Eva, MD    Family History Family History  Problem Relation Age of Onset  . Colon cancer Maternal Grandfather 63  . Heart attack Father   . Stomach cancer Maternal Aunt 80    Social History Social  History  Substance Use Topics  . Smoking status: Former Smoker    Quit date: 02/09/1982  . Smokeless tobacco: Never Used  . Alcohol use No     Allergies   Uloric [febuxostat]   Review of Systems Review of Systems  Constitutional: Negative for chills and fever.  HENT: Negative for ear pain and sore throat.   Eyes: Negative for pain and visual disturbance.  Respiratory: Positive for cough and shortness of breath.   Cardiovascular: Positive for leg  swelling. Negative for chest pain and palpitations.  Gastrointestinal: Negative for abdominal pain and vomiting.  Genitourinary: Negative for dysuria and hematuria.  Musculoskeletal: Negative for arthralgias and back pain.  Skin: Negative for color change and rash.  Neurological: Negative for seizures and syncope.  All other systems reviewed and are negative.    Physical Exam Updated Vital Signs BP 124/70 (BP Location: Right Wrist)   Pulse 98   Temp 97.8 F (36.6 C)   Resp (!) 28   Ht 5\' 8"  (1.727 m)   Wt (!) 153.3 kg   SpO2 99%   BMI 51.39 kg/m   Physical Exam  Constitutional: He is oriented to person, place, and time. He appears well-developed and well-nourished.  HENT:  Head: Normocephalic and atraumatic.  Eyes: Conjunctivae are normal.  Neck: Neck supple.  Cardiovascular: Normal rate and regular rhythm.  Exam reveals no friction rub.   No murmur heard. Pulmonary/Chest: He is in respiratory distress. He has wheezes. He has no rales. He exhibits tenderness (right-sided).  Breathing with pursed lips. Mildly tachypnic (upper 20s)  Abdominal: Soft. He exhibits no distension. There is no tenderness. There is no guarding.  Obese  Musculoskeletal: He exhibits no edema, tenderness or deformity.  Neurological: He is alert and oriented to person, place, and time.  Skin: Skin is warm and dry.  Psychiatric: He has a normal mood and affect.  Nursing note and vitals reviewed.    ED Treatments / Results  Labs (all labs ordered are listed, but only abnormal results are displayed) Labs Reviewed  BASIC METABOLIC PANEL - Abnormal; Notable for the following:       Result Value   Glucose, Bld 130 (*)    Calcium 8.8 (*)    All other components within normal limits  CBC - Abnormal; Notable for the following:    Platelets 132 (*)    All other components within normal limits  BASIC METABOLIC PANEL - Abnormal; Notable for the following:    Chloride 100 (*)    Glucose, Bld 136 (*)      Calcium 8.3 (*)    All other components within normal limits  CBC - Abnormal; Notable for the following:    RBC 3.88 (*)    Hemoglobin 11.6 (*)    HCT 36.8 (*)    Platelets 136 (*)    All other components within normal limits  GLUCOSE, CAPILLARY - Abnormal; Notable for the following:    Glucose-Capillary 126 (*)    All other components within normal limits  GLUCOSE, CAPILLARY - Abnormal; Notable for the following:    Glucose-Capillary 129 (*)    All other components within normal limits  GLUCOSE, CAPILLARY - Abnormal; Notable for the following:    Glucose-Capillary 126 (*)    All other components within normal limits  BRAIN NATRIURETIC PEPTIDE  HEPARIN LEVEL (UNFRACTIONATED)  TROPONIN I  TROPONIN I  TROPONIN I  HEPARIN LEVEL (UNFRACTIONATED)  I-STAT TROPOININ, ED    EKG  EKG Interpretation None  Radiology Dg Chest 2 View  Result Date: 08/05/2016 CLINICAL DATA:  Shortness of breath, cough, wheezing and right chest pain since yesterday. EXAM: CHEST  2 VIEW COMPARISON:  PA and lateral chest 06/20/2016 and 01/26/2016. FINDINGS: Lungs are clear. Heart size is mildly enlarged. No pneumothorax or pleural effusion. No acute bony abnormality. IMPRESSION: No acute disease. Electronically Signed   By: Inge Rise M.D.   On: 08/05/2016 16:38   Ct Angio Chest Pe W And/or Wo Contrast  Result Date: 08/05/2016 CLINICAL DATA:  Shortness of breath on exertion. EXAM: CT ANGIOGRAPHY CHEST WITH CONTRAST TECHNIQUE: Multidetector CT imaging of the chest was performed using the standard protocol during bolus administration of intravenous contrast. Multiplanar CT image reconstructions and MIPs were obtained to evaluate the vascular anatomy. CONTRAST:  100 cc Isovue 370 COMPARISON:  None. FINDINGS: Examination is very limited due to morbid obesity and significant motion artifact. Chest wall: No chest wall mass, supraclavicular or axillary adenopathy. Small scattered lymph nodes are noted.  The thyroid gland is grossly normal. Cardiovascular: The heart is mildly enlarged for age. No pericardial effusion. The thoracic aorta is normal in caliber. No dissection. Scattered atherosclerotic calcifications. Very limited examination due to limited opacification significant breathing motion artifact. However, there does appear to be clot in the right pulmonary artery. Left lower lobe pulmonary artery clot is also possible. No definite findings for right heart strain. Mediastinum/Nodes: No mediastinal or hilar mass or adenopathy. Scattered lymph nodes are noted. The esophagus is grossly normal. Lungs/Pleura: Screening limited examination of the lungs. There are patchy areas of dependent lower lobe atelectasis but no obvious infiltrates, edema or significant effusions. Upper Abdomen: Very limited examination. Musculoskeletal: No significant bony findings. Suspect DISH or ankylosing spondylitis. Review of the MIP images confirms the above findings. IMPRESSION: 1. Extremely limited examination. 2. Suspect bilateral pulmonary emboli. No definite right heart strain. 3. No obvious acute pulmonary findings. 4. Grossly normal thoracic aorta. Electronically Signed   By: Marijo Sanes M.D.   On: 08/05/2016 18:51    Procedures Procedures (including critical care time)  Medications Ordered in ED Medications  iopamidol (ISOVUE-370) 76 % injection (not administered)  heparin ADULT infusion 100 units/mL (25000 units/260mL sodium chloride 0.45%) (2,200 Units/hr Intravenous New Bag/Given 08/06/16 0656)  colchicine tablet 0.6 mg (not administered)  furosemide (LASIX) tablet 40 mg (40 mg Oral Given 08/06/16 1027)  ondansetron (ZOFRAN) tablet 4 mg (not administered)    Or  ondansetron (ZOFRAN) injection 4 mg (not administered)  0.9 %  sodium chloride infusion ( Intravenous New Bag/Given 08/05/16 2030)  acetaminophen (TYLENOL) tablet 650 mg (not administered)    Or  acetaminophen (TYLENOL) suppository 650 mg (not  administered)  HYDROcodone-acetaminophen (NORCO/VICODIN) 5-325 MG per tablet 1-2 tablet (not administered)  albuterol (PROVENTIL) (2.5 MG/3ML) 0.083% nebulizer solution 2.5 mg (2.5 mg Nebulization Given 08/06/16 1043)  insulin aspart (novoLOG) injection 0-9 Units (1 Units Subcutaneous Given 08/06/16 0637)  albuterol (PROVENTIL) (2.5 MG/3ML) 0.083% nebulizer solution 5 mg (5 mg Nebulization Given 08/05/16 1651)  heparin bolus via infusion 5,000 Units (5,000 Units Intravenous Bolus from Bag 08/05/16 2200)     Initial Impression / Assessment and Plan / ED Course  I have reviewed the triage vital signs and the nursing notes.  Pertinent labs & imaging results that were available during my care of the patient were reviewed by me and considered in my medical decision making (see chart for details).     Pt is 56 yo male with hx of HTN, HLD,  Dm, morbid obesity, asthma, and gout who p/w 2 days of progressive SOB. Of note, pt was recently dx'd with gout and spent 2 weeks in inpatient rehab due to difficulty walking. Pt has no prior hx of DVT or Pe. No hx of CAD. Denies chest pain. EKG shows no acute ischemic changes. CXR without acute findings. Pt did have wheezing on exam, improved with nebs. He continues to have tachycardia (low 100s) and tachypnea. Satting well on room air. Plan is for CTA to eval for Pe.  CT confirmed likely bilateral Pe. Trop negative. Pt started on heparin infusion and admitted to hospitalist for further anticoagulation. PE is likely provoked in setting of recent immobility. He remained HDS while under my care in the Ed.  Final Clinical Impressions(s) / ED Diagnoses   Final diagnoses:  Shortness of breath  Other pulmonary embolism without acute cor pulmonale, unspecified chronicity [facility])    New Prescriptions Current Discharge Medication List       Clifton James, MD 08/06/16 [place], MD 08/07/16 [phone]

## 2016-08-05 NOTE — ED Notes (Signed)
Admitting provider at bedside.

## 2016-08-05 NOTE — Progress Notes (Signed)
ANTICOAGULATION CONSULT NOTE - Initial Consult  Pharmacy Consult for heparin Indication: pulmonary embolus  Allergies  Allergen Reactions  . Uloric [Febuxostat] Other (See Comments)    Made the skin on feet and legs "hurt"    Patient Measurements: Weight: (!) 470 lb (213.2 kg) Heparin Dosing Weight: 130 kg  Vital Signs: Temp: 98.2 F (36.8 C) (02/15 1529) Temp Source: Oral (02/15 1529) BP: 155/67 (02/15 1900) Pulse Rate: 104 (02/15 1900)  Labs:  Recent Labs  08/05/16 1711  HGB 13.3  HCT 40.7  PLT 132*  CREATININE 1.06    Estimated Creatinine Clearance: 137.5 mL/min (by C-G formula based on SCr of 1.06 mg/dL).   Medical History: Past Medical History:  Diagnosis Date  . Arthritis    right fingers, both knees  . Diabetes type 2, uncontrolled ([place])   . Gout of hand   . Hyperlipidemia   . Hypertension   . Morbidly obese [facility])     Assessment: 56 yo male to start on heparin infusion for new bilateral PE with no evidence of right heart strain. Hgb stable, PLTc 132 but historically low.  Weight confirmed with patient.  Goal of Therapy:  Heparin level 0.3-0.7 units/ml Monitor platelets by anticoagulation protocol: Yes   Plan:  1. Give 5000 units bolus x 1 2. Start heparin infusion at 2200 units/hr 3. Check anti-Xa level in 6 hours and daily while on heparin 4. Continue to monitor H&H and platelets  Vincenza Hews, PharmD, BCPS 08/05/2016, 7:50 PM

## 2016-08-05 NOTE — ED Notes (Signed)
Floor to order bariatric bed then will transport pt

## 2016-08-05 NOTE — H&P (Signed)
History and Physical    [name] [id number] DOB: July 11, 1960 DOA: 08/05/2016  Referring MD/NP/PA: Dr. Melina Copa  PCP: Ronnie Chandler, NP  Patient coming from: Home via EMS  Chief Complaint: shortness of breath  HPI: Ronnie Hernandez is a 56 y.o. male with medical history significant of HTN, HLD, DM type 2, gout, and morbid obesity; who presents with complaints of shortness of breath over last 2 days. Patient was just recently hospitalized 1/16-1/19 for an acute gout attack. Following the hospitalization the patient was unable to ambulate and required a two-week stay at rehabilitation. Thereafter patient has been at home and has been more sedentary than normal. He reports ambulating with the use of a cane. Symptoms of shortness of breath started acutely and patient reports inability to sleep laying down at night. Tried utilizing albuterol inhaler without any relief of symptoms. Associated symptoms included a mildly productive cough, hot flashes, bilateral leg swelling, and pleuritic chest pain. Patient denies any hemoptysis, dysuria, chills, abdominal pain, nausea, vomiting, diarrhea, or loss of consciousness. Patient's never had blood clots or been on blood thinners previously.  ED Course: Upon admission to the emergency department patient was seen to be afebrile, heart rates 104-118, respirations up to the 30s, BP maintained, and O2 saturations within normal limits on RA. CT angiogram of the chest revealed signs of bilateral pulmonary emboli. Patient was started on a heparin drip per pharmacy. [facility] called to admit.   Review of Systems: As per HPI otherwise 10 point review of systems negative.   Past Medical History:  Diagnosis Date  . Arthritis    right fingers, both knees  . Diabetes type 2, uncontrolled ([place])   . Gout of hand   . Hyperlipidemia   . Hypertension   . Morbidly obese [facility])     Past Surgical History:  Procedure Laterality Date  . unremarkable       reports  that he quit smoking about 34 years ago. He has never used smokeless tobacco. He reports that he does not drink alcohol or use drugs.  Allergies  Allergen Reactions  . Uloric [Febuxostat] Other (See Comments)    Made the skin on feet and legs "hurt"    Family History  Problem Relation Age of Onset  . Colon cancer Maternal Grandfather 45  . Heart attack Father   . Stomach cancer Maternal Aunt 80    Prior to Admission medications   Medication Sig Start Date End Date Taking? Authorizing Provider  albuterol (PROVENTIL HFA;VENTOLIN HFA) 108 (90 Base) MCG/ACT inhaler Inhale 1-2 puffs into the lungs every 6 (six) hours as needed for wheezing or shortness of breath.   Yes Historical Provider, MD  colchicine 0.6 MG tablet TAKE 1 TABLET (0.6 MG TOTAL) BY MOUTH 2 (TWO) TIMES DAILY as needed for gouty attack/pain 07/09/16  Yes Orson Eva, MD  furosemide (LASIX) 20 MG tablet Take 2 tablets (40 mg total) by mouth daily. 03/01/16  Yes Ronnie Chandler, NP  cephALEXin (KEFLEX) 500 MG capsule Take 1 capsule (500 mg total) by mouth every 8 (eight) hours. Patient not taking: Reported on 08/05/2016 07/08/16   Orson Eva, MD  insulin aspart (NOVOLOG) 100 UNIT/ML injection CBG 70-120--no units; 121-150--3 units; 151-200--4 units; 201-250--7 units; 251-300--11 units; 301-350--15 units; 351-400--20 units; >400--call MD CHECK CBG before each meal Patient not taking: Reported on 08/05/2016 07/09/16   Orson Eva, MD  insulin aspart (NOVOLOG) 100 UNIT/ML injection CHECK CBGs at bedtime CBG 0-200--NO units; 201-250--2 units; 251-300--3 units;  301-350--4 units; 351-400--5 units; >400--call MD Patient not taking: Reported on 08/05/2016 07/09/16   Orson Eva, MD  predniSONE (DELTASONE) 10 MG tablet Take 6 tablets (60 mg total) by mouth daily with breakfast. And decrease by one tablet daily Patient not taking: Reported on 08/05/2016 07/10/16   Orson Eva, MD    Physical Exam:    Constitutional: Morbidly obese male who appears  to be in mild respiratory discomfort. Vitals:   08/05/16 1545 08/05/16 1745 08/05/16 1900 08/05/16 1915  BP: 146/88 138/62 155/67   Pulse: 105 108 104   Resp: 25 (!) 33 (!) 37   Temp:      TempSrc:      SpO2: 100% 96% 98%   Weight:    (!) 213.2 kg (470 lb)   Eyes: PERRL, lids and conjunctivae normal ENMT: Mucous membranes are moist. Posterior pharynx clear of any exudate or lesions. Normal dentition.  Neck: normal, supple, no masses, no thyromegaly Respiratory: Tachypneic with decreased overall aeration appreciated on auscultation. Patient remained in shorten sinuses. Cardiovascular: Regular rate and rhythm, no murmurs / rubs / gallops. +2 pitting extremity edema. 2+ pedal pulses. No carotid bruits.  Abdomen: no tenderness, no masses palpated. No hepatosplenomegaly. Bowel sounds positive.  Musculoskeletal: no clubbing / cyanosis. No joint deformity upper and lower extremities. Good ROM, no contractures. Normal muscle tone.  Skin: Venous stasis changes noted of the bilateral lower extremities No induration Neurologic: CN 2-12 grossly intact. Sensation intact, DTR normal. Strength 5/5 in all 4.  Psychiatric: Normal judgment and insight. Alert and oriented x 3. Normal mood.     Labs on Admission: I have personally reviewed following labs and imaging studies  CBC:  Recent Labs Lab 08/05/16 1711  WBC 7.7  HGB 13.3  HCT 40.7  MCV 95.1  PLT [id number]*   Basic Metabolic Panel:  Recent Labs Lab 08/05/16 1711  NA 141  K 3.8  CL 102  CO2 30  GLUCOSE 130*  BUN 10  CREATININE 1.06  CALCIUM 8.8*   GFR: Estimated Creatinine Clearance: 137.5 mL/min (by C-G formula based on SCr of 1.06 mg/dL). Liver Function Tests: No results for input(s): AST, ALT, ALKPHOS, BILITOT, PROT, ALBUMIN in the last 168 hours. No results for input(s): LIPASE, AMYLASE in the last 168 hours. No results for input(s): AMMONIA in the last 168 hours. Coagulation Profile: No results for input(s): INR, PROTIME in  the last 168 hours. Cardiac Enzymes: No results for input(s): CKTOTAL, CKMB, CKMBINDEX, TROPONINI in the last 168 hours. BNP (last 3 results) No results for input(s): PROBNP in the last 8760 hours. HbA1C: No results for input(s): HGBA1C in the last 72 hours. CBG: No results for input(s): GLUCAP in the last 168 hours. Lipid Profile: No results for input(s): CHOL, HDL, LDLCALC, TRIG, CHOLHDL, LDLDIRECT in the last 72 hours. Thyroid Function Tests: No results for input(s): TSH, T4TOTAL, FREET4, T3FREE, THYROIDAB in the last 72 hours. Anemia Panel: No results for input(s): VITAMINB12, FOLATE, FERRITIN, TIBC, IRON, RETICCTPCT in the last 72 hours. Urine analysis:    Component Value Date/Time   COLORURINE AMBER (A) 07/06/2016 1251   APPEARANCEUR CLEAR 07/06/2016 1251   APPEARANCEUR Clear 12/05/2015 1226   LABSPEC 1.017 07/06/2016 1251   PHURINE 5.0 07/06/2016 1251   GLUCOSEU NEGATIVE 07/06/2016 1251   HGBUR NEGATIVE 07/06/2016 1251   BILIRUBINUR NEGATIVE 07/06/2016 1251   BILIRUBINUR Negative 12/05/2015 1226   KETONESUR NEGATIVE 07/06/2016 1251   PROTEINUR NEGATIVE 07/06/2016 1251   UROBILINOGEN 0.2 12/28/2013 1117  NITRITE NEGATIVE 07/06/2016 1251   LEUKOCYTESUR NEGATIVE 07/06/2016 1251   LEUKOCYTESUR Negative 12/05/2015 1226   Sepsis Labs: No results found for this or any previous visit (from the past 240 hour(s)).   Radiological Exams on Admission: Dg Chest 2 View  Result Date: 08/05/2016 CLINICAL DATA:  Shortness of breath, cough, wheezing and right chest pain since yesterday. EXAM: CHEST  2 VIEW COMPARISON:  PA and lateral chest 06/20/2016 and 01/26/2016. FINDINGS: Lungs are clear. Heart size is mildly enlarged. No pneumothorax or pleural effusion. No acute bony abnormality. IMPRESSION: No acute disease. Electronically Signed   By: Inge Rise M.D.   On: 08/05/2016 16:38   Ct Angio Chest Pe W And/or Wo Contrast  Result Date: 08/05/2016 CLINICAL DATA:  Shortness of  breath on exertion. EXAM: CT ANGIOGRAPHY CHEST WITH CONTRAST TECHNIQUE: Multidetector CT imaging of the chest was performed using the standard protocol during bolus administration of intravenous contrast. Multiplanar CT image reconstructions and MIPs were obtained to evaluate the vascular anatomy. CONTRAST:  100 cc Isovue 370 COMPARISON:  None. FINDINGS: Examination is very limited due to morbid obesity and significant motion artifact. Chest wall: No chest wall mass, supraclavicular or axillary adenopathy. Small scattered lymph nodes are noted. The thyroid gland is grossly normal. Cardiovascular: The heart is mildly enlarged for age. No pericardial effusion. The thoracic aorta is normal in caliber. No dissection. Scattered atherosclerotic calcifications. Very limited examination due to limited opacification significant breathing motion artifact. However, there does appear to be clot in the right pulmonary artery. Left lower lobe pulmonary artery clot is also possible. No definite findings for right heart strain. Mediastinum/Nodes: No mediastinal or hilar mass or adenopathy. Scattered lymph nodes are noted. The esophagus is grossly normal. Lungs/Pleura: Screening limited examination of the lungs. There are patchy areas of dependent lower lobe atelectasis but no obvious infiltrates, edema or significant effusions. Upper Abdomen: Very limited examination. Musculoskeletal: No significant bony findings. Suspect DISH or ankylosing spondylitis. Review of the MIP images confirms the above findings. IMPRESSION: 1. Extremely limited examination. 2. Suspect bilateral pulmonary emboli. No definite right heart strain. 3. No obvious acute pulmonary findings. 4. Grossly normal thoracic aorta. Electronically Signed   By: Marijo Sanes M.D.   On: 08/05/2016 18:51    EKG: Independently reviewed. Sinus tachycardia  Assessment/Plan Pulmonary embolus ([place]): Acute. Patient presents with a 2 day history of shortness of breath. On  admission patient found to be tachycardic and tachypneic. CT angiogram of chest revealing suspected bilateral pulmonary emboli.  - Admit to telemetry bed - Heparin drip per pharmacy, and determine best oral anticoagulation agent in a.m. - Continuous pulse oximetry overnight with nasal cannula oxygen as needed - Check echocardiogram and bilateral lower extremity doppler ultrasound in a.m. - IVF NS 75 mL per hour overnight because of recent contrast load given - Albuterol neb prn sob/ wheezing  Essential hypertension - Continue Lasix  Thrombocytopenia: Platelet count 134 on admission. Previously noted intermittently to have low platelet counts when reviewing records. - Continue to monitor  Diabetes mellitus type 2:  Patient appears to no longer be taking insulin. - Hypoglycemic protocols - CBGs every before meals and at bedtime with insulin sliding scale insulin   Gout: Without acute flare - Continue colchicine  Gait disturbance: Patient walks with assistance of a cane  Morbid Obesity  DVT prophylaxis: Heparin   Code Status: [place] Family Communication: Discussed plan of care with the patient and family present at bedside. Disposition Plan: Likely discharge home once medically  stable Consults called: none Admission status: observation  Norval Morton MD [facility] Pager [phone]  If 7PM-7AM, please contact night-coverage [email or web address] Password [facility]  08/05/2016, 7:41 PM

## 2016-08-06 ENCOUNTER — Encounter: Payer: Self-pay | Admitting: Internal Medicine

## 2016-08-06 ENCOUNTER — Observation Stay (HOSPITAL_BASED_OUTPATIENT_CLINIC_OR_DEPARTMENT_OTHER): Payer: Medicare HMO

## 2016-08-06 DIAGNOSIS — I2699 Other pulmonary embolism without acute cor pulmonale: Secondary | ICD-10-CM | POA: Diagnosis not present

## 2016-08-06 DIAGNOSIS — I10 Essential (primary) hypertension: Secondary | ICD-10-CM | POA: Diagnosis not present

## 2016-08-06 DIAGNOSIS — E1165 Type 2 diabetes mellitus with hyperglycemia: Secondary | ICD-10-CM | POA: Diagnosis not present

## 2016-08-06 DIAGNOSIS — M109 Gout, unspecified: Secondary | ICD-10-CM | POA: Diagnosis not present

## 2016-08-06 LAB — BASIC METABOLIC PANEL
ANION GAP: 9 (ref 5–15)
BUN: 8 mg/dL (ref 6–20)
CALCIUM: 8.3 mg/dL — AB (ref 8.9–10.3)
CO2: 28 mmol/L (ref 22–32)
CREATININE: 0.92 mg/dL (ref 0.61–1.24)
Chloride: 100 mmol/L — ABNORMAL LOW (ref 101–111)
Glucose, Bld: 136 mg/dL — ABNORMAL HIGH (ref 65–99)
Potassium: 3.5 mmol/L (ref 3.5–5.1)
SODIUM: 137 mmol/L (ref 135–145)

## 2016-08-06 LAB — CBC
HCT: 36.8 % — ABNORMAL LOW (ref 39.0–52.0)
HEMOGLOBIN: 11.6 g/dL — AB (ref 13.0–17.0)
MCH: 29.9 pg (ref 26.0–34.0)
MCHC: 31.5 g/dL (ref 30.0–36.0)
MCV: 94.8 fL (ref 78.0–100.0)
PLATELETS: 136 10*3/uL — AB (ref 150–400)
RBC: 3.88 MIL/uL — AB (ref 4.22–5.81)
RDW: 14.8 % (ref 11.5–15.5)
WBC: 7 10*3/uL (ref 4.0–10.5)

## 2016-08-06 LAB — GLUCOSE, CAPILLARY
Glucose-Capillary: 129 mg/dL — ABNORMAL HIGH (ref 65–99)
Glucose-Capillary: 126 mg/dL — ABNORMAL HIGH (ref 65–99)
Glucose-Capillary: 128 mg/dL — ABNORMAL HIGH (ref 65–99)
Glucose-Capillary: 155 mg/dL — ABNORMAL HIGH (ref 65–99)

## 2016-08-06 LAB — HEPARIN LEVEL (UNFRACTIONATED)
HEPARIN UNFRACTIONATED: 0.37 [IU]/mL (ref 0.30–0.70)
Heparin Unfractionated: 0.4 IU/mL (ref 0.30–0.70)

## 2016-08-06 LAB — TROPONIN I

## 2016-08-06 LAB — ECHOCARDIOGRAM COMPLETE
HEIGHTINCHES: 68 in
WEIGHTICAEL: 5408 [oz_av]

## 2016-08-06 MED ORDER — RIVAROXABAN 20 MG PO TABS: 20.0000 mg | ORAL_TABLET | Freq: Every day | ORAL | Status: DC

## 2016-08-06 MED ORDER — ALBUTEROL SULFATE (2.5 MG/3ML) 0.083% IN NEBU
2.5000 mg | INHALATION_SOLUTION | Freq: Four times a day (QID) | RESPIRATORY_TRACT | Status: DC
  Administered 2016-08-07 (×2): 2.5 mg via RESPIRATORY_TRACT
  Filled 2016-08-06 (×4): qty 3

## 2016-08-06 MED ORDER — RIVAROXABAN 15 MG PO TABS
15.0000 mg | ORAL_TABLET | Freq: Two times a day (BID) | ORAL | Status: DC
  Administered 2016-08-06 – 2016-08-08 (×4): 15 mg via ORAL
  Filled 2016-08-06 (×6): qty 1

## 2016-08-06 MED ORDER — PERFLUTREN LIPID MICROSPHERE
1.0000 mL | INTRAVENOUS | Status: AC | PRN
  Administered 2016-08-06: 2 mL via INTRAVENOUS
  Filled 2016-08-06: qty 10

## 2016-08-06 MED ORDER — PERFLUTREN LIPID MICROSPHERE
INTRAVENOUS | Status: AC
  Administered 2016-08-06: 2 mL via INTRAVENOUS
  Filled 2016-08-06: qty 10

## 2016-08-06 NOTE — Progress Notes (Signed)
*  PRELIMINARY RESULTS* Vascular Ultrasound Bilateral lower extremity venous duplex has been completed.  Preliminary findings: Very difficult exam due to patient body habitus and penetration.  There is no evidence of deep vein thrombosis in the visualized veins of the lower extremities.   Everrett Coombe 08/06/2016, 12:11 PM

## 2016-08-06 NOTE — Progress Notes (Addendum)
ANTICOAGULATION CONSULT NOTE - Follow Up Consult  Pharmacy Consult for Heparin Indication: pulmonary embolus  Allergies  Allergen Reactions  . Uloric [Febuxostat] Other (See Comments)    Made the skin on feet and legs "hurt"    Patient Measurements: Height: 5\' 8"  (172.7 cm) Weight: (!) 338 lb (153.3 kg) IBW/kg (Calculated) : 68.4 Heparin Dosing Weight:130kg  Vital Signs: Temp: 97.8 F (36.6 C) (02/16 1006) Temp Source: Oral (02/16 0422) BP: 124/70 (02/16 1006) Pulse Rate: 98 (02/16 1006)  Labs:  Recent Labs  08/05/16 1711 08/05/16 2137 08/06/16 0302 08/06/16 0927 08/06/16 1126  HGB 13.3  --  11.6*  --   --   HCT 40.7  --  36.8*  --   --   PLT 132*  --  136*  --   --   HEPARINUNFRC  --   --  0.37  --  0.40  CREATININE 1.06  --  0.92  --   --   TROPONINI  --  <0.03 <0.03 <0.03  --     Estimated Creatinine Clearance: 129.9 mL/min (by C-G formula based on SCr of 0.92 mg/dL).   Medications:  Heparin @ 2200 units/hr  Assessment: 55yom continues on heparin for bilateral PEs. Dopplers negative for DVT. Initial heparin level was therapeutic at 0.37. Confirmatory heparin level also therapeutic at 0.40. CBC stable. No bleeding reported.  Goal of Therapy:  Heparin level 0.3-0.7 units/ml Monitor platelets by anticoagulation protocol: Yes   Plan:  1) Continue heparin at 2200 units/hr 2) Daily heparin level and CBC  Deboraha Sprang 08/06/2016,12:22 PM     ADDENDUM: Pharmacy asked to switch patient to [place]. Will dose xarelto 15mg  bid x 21 days then 20mg  daily. Case management consult ordered. Will provide education.  Deboraha Sprang 08/06/2016, 1:33 PM

## 2016-08-06 NOTE — Progress Notes (Signed)
  Echocardiogram 2D Echocardiogram with Definity has been performed.  Darlina Sicilian M 08/06/2016, 3:41 PM

## 2016-08-06 NOTE — Progress Notes (Signed)
Pt arrived to floor by RN. He was able to walk to bariatric bed with cane. Pt is resting in bed comfortably. Call bell within reach, cardiac monitor on, and snacks given. Pt is alert and oriented.

## 2016-08-06 NOTE — Progress Notes (Signed)
[facility]  PROGRESS NOTE  Ronnie Hernandez [id number] DOB: March 22, 1961 DOA: 08/05/2016 PCP: [name], NP   Brief HPI:    56 y.o. male with medical history significant of HTN, HLD, DM type 2, gout, and morbid obesity; who presents with complaints of shortness of breath over last 2 days. Patient was just recently hospitalized 1/16-1/19 for an acute gout attack. Following the hospitalization the patient was unable to ambulate and required a two-week stay at rehabilitation. Thereafter patient has been at home and has been more sedentary than normal. He reports ambulating with the use of a cane. Symptoms of shortness of breath started acutely and patient reports inability to sleep laying down at night. Tried utilizing albuterol inhaler without any relief of symptoms. Associated symptoms included a mildly productive cough, hot flashes, bilateral leg swelling, and pleuritic chest pain. Patient denies any hemoptysis, dysuria, chills, abdominal pain, nausea, vomiting, diarrhea, or loss of consciousness. Patient's never had blood clots or been on blood thinners previously.  In ED heart rates 104-118, respirations up to the 30s, BP maintained, and O2 saturations within normal limits on RA. CT angiogram of the chest revealed signs of bilateral pulmonary emboli.    Subjective   This morning patient denies chest pain but admits to mild shortness of breath   Assessment/Plan:     1. Bilateral pulmonary embolism- CTA showed bilateral punctate emboli, no heart strain. Patient started on heparin protocol. Still has mild tachypnea, Will consult pharmacy and start on Xarelto. 2. Hypertension-blood pressure is controlled. 3. Diabetes mellitus-continue sliding scale insulin with NovoLog 4. History of gout- stable, continue colchicine 5. History of thrombocytopenia- platelet count on admission was 134, this morning count is 136. Follow CBC in a.m.    DVT prophylaxis: On heparin  protocol  Code Status: Full code  Family Communication: No family present at bedside   Disposition Plan: Home once medically stable   Consultants:  None  Procedures:  None  Continuous infusions . sodium chloride 75 mL/hr at 08/05/16 2030  . heparin 2,200 Units/hr (08/06/16 0656)      Antibiotics:   Anti-infectives    None       Objective   Vitals:   08/05/16 2142 08/06/16 0104 08/06/16 0422 08/06/16 1006  BP: 106/69  137/66 124/70  Pulse: (!) 107  (!) 104 98  Resp: 20  18 (!) 28  Temp: 97.7 F (36.5 C)  98.3 F (36.8 C) 97.8 F (36.6 C)  TempSrc: Oral  Oral   SpO2: 97% 100% 91% 99%  Weight: (!) 153.3 kg (338 lb)     Height: 5\' 8"  (1.727 m)       Intake/Output Summary (Last 24 hours) at 08/06/16 1310 Last data filed at 08/06/16 1200  Gross per 24 hour  Intake              222 ml  Output              975 ml  Net             -753 ml   Filed Weights   08/05/16 1915 08/05/16 2142  Weight: (!) 213.2 kg (470 lb) (!) 153.3 kg (338 lb)     Physical Examination:  General exam: Appears calm and comfortable. Respiratory system: Clear to auscultation. Mild tachypnea Cardiovascular system:  RRR. No  murmurs, rubs, gallops. No pedal edema. GI system: Abdomen is nondistended, soft and nontender. No organomegaly.  Central nervous system. No focal neurological deficits. 5 x  5 power in all extremities. Skin: No rashes, lesions or ulcers. Psychiatry: Alert, oriented x 3.Judgement and insight appear normal. Affect normal.    Data Reviewed: I have personally reviewed following labs and imaging studies  CBG:  Recent Labs Lab 08/05/16 2139 08/06/16 0617 08/06/16 1111  GLUCAP 126* 129* 126*    CBC:  Recent Labs Lab 08/05/16 1711 08/06/16 0302  WBC 7.7 7.0  HGB 13.3 11.6*  HCT 40.7 36.8*  MCV 95.1 94.8  PLT 132* 136*    Basic Metabolic Panel:  Recent Labs Lab 08/05/16 1711 08/06/16 0302  NA 141 137  K 3.8 3.5  CL 102 100*  CO2 30 28   GLUCOSE 130* 136*  BUN 10 8  CREATININE 1.06 0.92  CALCIUM 8.8* 8.3*    No results found for this or any previous visit (from the past 240 hour(s)).   Liver Function Tests: No results for input(s): AST, ALT, ALKPHOS, BILITOT, PROT, ALBUMIN in the last 168 hours. No results for input(s): LIPASE, AMYLASE in the last 168 hours. No results for input(s): AMMONIA in the last 168 hours.  Cardiac Enzymes:  Recent Labs Lab 08/05/16 2137 08/06/16 0302 08/06/16 0927  TROPONINI <0.03 <0.03 <0.03   BNP (last 3 results)  Recent Labs  08/05/16 1711  BNP 15.2    ProBNP (last 3 results) No results for input(s): PROBNP in the last 8760 hours.    Studies: Dg Chest 2 View  Result Date: 08/05/2016 CLINICAL DATA:  Shortness of breath, cough, wheezing and right chest pain since yesterday. EXAM: CHEST  2 VIEW COMPARISON:  PA and lateral chest 06/20/2016 and 01/26/2016. FINDINGS: Lungs are clear. Heart size is mildly enlarged. No pneumothorax or pleural effusion. No acute bony abnormality. IMPRESSION: No acute disease. Electronically Signed   By: Inge Rise M.D.   On: 08/05/2016 16:38   Ct Angio Chest Pe W And/or Wo Contrast  Result Date: 08/05/2016 CLINICAL DATA:  Shortness of breath on exertion. EXAM: CT ANGIOGRAPHY CHEST WITH CONTRAST TECHNIQUE: Multidetector CT imaging of the chest was performed using the standard protocol during bolus administration of intravenous contrast. Multiplanar CT image reconstructions and MIPs were obtained to evaluate the vascular anatomy. CONTRAST:  100 cc Isovue 370 COMPARISON:  None. FINDINGS: Examination is very limited due to morbid obesity and significant motion artifact. Chest wall: No chest wall mass, supraclavicular or axillary adenopathy. Small scattered lymph nodes are noted. The thyroid gland is grossly normal. Cardiovascular: The heart is mildly enlarged for age. No pericardial effusion. The thoracic aorta is normal in caliber. No dissection.  Scattered atherosclerotic calcifications. Very limited examination due to limited opacification significant breathing motion artifact. However, there does appear to be clot in the right pulmonary artery. Left lower lobe pulmonary artery clot is also possible. No definite findings for right heart strain. Mediastinum/Nodes: No mediastinal or hilar mass or adenopathy. Scattered lymph nodes are noted. The esophagus is grossly normal. Lungs/Pleura: Screening limited examination of the lungs. There are patchy areas of dependent lower lobe atelectasis but no obvious infiltrates, edema or significant effusions. Upper Abdomen: Very limited examination. Musculoskeletal: No significant bony findings. Suspect DISH or ankylosing spondylitis. Review of the MIP images confirms the above findings. IMPRESSION: 1. Extremely limited examination. 2. Suspect bilateral pulmonary emboli. No definite right heart strain. 3. No obvious acute pulmonary findings. 4. Grossly normal thoracic aorta. Electronically Signed   By: Marijo Sanes M.D.   On: 08/05/2016 18:51    Scheduled Meds: . furosemide  40 mg Oral  Daily  . insulin aspart  0-9 Units Subcutaneous TID WC      Time spent: 25 min  [facility] Pager [phone]. If 7PM-7AM, please contact night-coverage at [email or web address], Office  [phone]  password [email or web address] 08/06/2016, 1:10 PM  LOS: 0 days

## 2016-08-06 NOTE — Progress Notes (Signed)
Per insurance check for Xarelto S/W  Ronnie Hernandez  @ [employer] [facility] # [phone] OPT- 2   1. XARELTO  20 MG DAILY   COVER- YES  CO-PAY- $ 8.35  PRIOR APPROVAL-NO   PHARMACY : [facility]

## 2016-08-06 NOTE — Care Management Obs Status (Signed)
[facility] NOTIFICATION   Patient Details  Name: KAIDE KLEBE MRN: [id number] Date of Birth: [birth year]   Medicare Observation Status Notification Given:  Yes    Dawayne Patricia, RN 08/06/2016, 3:57 PM

## 2016-08-06 NOTE — Care Management Note (Signed)
Case Management Note Kristi Webster RN, BSN Unit 2W-Case Manager [phone]  Patient Details  Name: [name] MRN: [id number] Date of Birth: 11/05/1960  Subjective/Objective:  Pt presented with PE                  Action/Plan: PTA pt home, recently at SNF rehab, referral for Xarelto needs- per insurance check- S/W  TANESHA  @ [employer] [facility] # [phone] OPT- 2   1. XARELTO  20 MG DAILY   COVER- YES  CO-PAY- $ 8.35  PRIOR APPROVAL-NO   PHARMAC[facility]   Spoke with pt at bedside- coverage info shared- 30 day free card given to pt- per pt [facility] uses [facility] on [address]- call made to pharmacy to see if they carry starter kit-  They unfortunately do not have any starter kits in stock- [facility] [address] [facility] and use the 30 day free card- will need script with no refills to use with card.      Expected Discharge Date:                  Expected Discharge Plan:  Home/Self Care  In-House Referral:     Discharge planning Services  CM Consult, Medication Assistance  Post Acute Care Choice:    Choice offered to:     DME Arranged:    DME Agency:     HH Arranged:    HH Agency:     Status of Service:  Completed, signed off  If discussed at Long Length of Stay Meetings, dates discussed:    Additional Comments:  Webster, Kristi Hall, RN 08/06/2016, 4:00 PM  

## 2016-08-07 DIAGNOSIS — M1000 Idiopathic gout, unspecified site: Secondary | ICD-10-CM

## 2016-08-07 LAB — GLUCOSE, CAPILLARY
GLUCOSE-CAPILLARY: 131 mg/dL — AB (ref 65–99)
GLUCOSE-CAPILLARY: 112 mg/dL — AB (ref 65–99)
GLUCOSE-CAPILLARY: 122 mg/dL — AB (ref 65–99)
Glucose-Capillary: 145 mg/dL — ABNORMAL HIGH (ref 65–99)

## 2016-08-07 LAB — CBC
HCT: 37.9 % — ABNORMAL LOW (ref 39.0–52.0)
Hemoglobin: 12.1 g/dL — ABNORMAL LOW (ref 13.0–17.0)
MCH: 30.6 pg (ref 26.0–34.0)
MCHC: 31.9 g/dL (ref 30.0–36.0)
MCV: 95.9 fL (ref 78.0–100.0)
PLATELETS: 159 10*3/uL (ref 150–400)
RBC: 3.95 MIL/uL — AB (ref 4.22–5.81)
RDW: 14.6 % (ref 11.5–15.5)
WBC: 8.2 10*3/uL (ref 4.0–10.5)

## 2016-08-07 MED ORDER — RIVAROXABAN 20 MG PO TABS: 20.0000 mg | ORAL_TABLET | Freq: Every day | ORAL | Status: DC

## 2016-08-07 NOTE — Discharge Instructions (Signed)
Information on my medicine - XARELTO (rivaroxaban)  This medication education was reviewed with me or my healthcare representative as part of my discharge preparation.  The pharmacist that spoke with me during my hospital stay was:  Deboraha Sprang, [place]? Xarelto was prescribed to treat blood clots that may have been found in the veins of your legs (deep vein thrombosis) or in your lungs (pulmonary embolism) and to reduce the risk of them occurring again.  What do you need to know about Xarelto? The starting dose is one 15 mg tablet taken TWICE daily with food for the FIRST 21 DAYS then on 08/28/16 the dose is changed to one 20 mg tablet taken ONCE A DAY with your evening meal.  DO NOT stop taking Xarelto without talking to the health care provider who prescribed the medication.  Refill your prescription for 20 mg tablets before you run out.  After discharge, you should have regular check-up appointments with your healthcare provider that is prescribing your Xarelto.  In the future your dose may need to be changed if your kidney function changes by a significant amount.  What do you do if you miss a dose? If you are taking Xarelto TWICE DAILY and you miss a dose, take it as soon as you remember. You may take two 15 mg tablets (total 30 mg) at the same time then resume your regularly scheduled 15 mg twice daily the next day.  If you are taking Xarelto ONCE DAILY and you miss a dose, take it as soon as you remember on the same day then continue your regularly scheduled once daily regimen the next day. Do not take two doses of Xarelto at the same time.   Important Safety Information Xarelto is a blood thinner medicine that can cause bleeding. You should call your healthcare provider right away if you experience any of the following: ? Bleeding from an injury or your nose that does not stop. ? Unusual colored urine (red or dark brown) or unusual  colored stools (red or black). ? Unusual bruising for unknown reasons. ? A serious fall or if you hit your head (even if there is no bleeding).  Some medicines may interact with Xarelto and might increase your risk of bleeding while on Xarelto. To help avoid this, consult your healthcare provider or pharmacist prior to using any new prescription or non-prescription medications, including herbals, vitamins, non-steroidal anti-inflammatory drugs (NSAIDs) and supplements.  This website has more information on Xarelto: [email or web address]

## 2016-08-07 NOTE — Progress Notes (Addendum)
[facility]  PROGRESS NOTE  Ronnie Hernandez [id number] DOB: 01-21-1961 DOA: 08/05/2016 PCP: [name], NP   Brief HPI:    56 y.o. male with medical history significant of HTN, HLD, DM type 2, gout, and morbid obesity; who presents with complaints of shortness of breath over last 2 days. Patient was just recently hospitalized 1/16-1/19 for an acute gout attack. Following the hospitalization the patient was unable to ambulate and required a two-week stay at rehabilitation. Thereafter patient has been at home and has been more sedentary than normal. He reports ambulating with the use of a cane. Symptoms of shortness of breath started acutely and patient reports inability to sleep laying down at night. Tried utilizing albuterol inhaler without any relief of symptoms. Associated symptoms included a mildly productive cough, hot flashes, bilateral leg swelling, and pleuritic chest pain. Patient denies any hemoptysis, dysuria, chills, abdominal pain, nausea, vomiting, diarrhea, or loss of consciousness. Patient's never had blood clots or been on blood thinners previously.  In ED heart rates 104-118, respirations up to the 30s, BP maintained, and O2 saturations within normal limits on RA. CT angiogram of the chest revealed signs of bilateral pulmonary emboli.   Subjective   No chest pain. Episode of wheezing this morning I resolved with bronchodilators. Also some tachycardia today.    Assessment/Plan:   1. Bilateral pulmonary embolism- CTA showed bilateral punctate emboli, no heart strain.  - Transitioned from heparin to Oral Anticoag with Evalina Field - Some tachycardia this morning- Hr 115, likely related to PE or wheezing this am - Monitor for 1 more day, D/c am - Discontinue IV fluids, 75cc/hr,  - continue Lasix 40 mg daily - wean off O2.- Not on O2 at home - Physical therapy evaluation  2. Hypertension-blood pressure is controlled. 3. Diabetes mellitus-continue sliding scale  insulin with NovoLog 4. History of gout- stable, continue colchicine 5. History of thrombocytopenia- platelet count on admission was 134, this morning count is 136. Follow CBC in a.m. 6. Reported wheezing per pt, with mild SOB - hx of asthma, never smoker. PRN BD. Likely exacerbated by PE.  DVT prophylaxis: Xarelto  Code Status: Full code  Family Communication: No family present at bedside   Disposition Plan: Home once medically stable  Consultants:  None  Procedures:  None  Continuous infusions None   Antibiotics:   Anti-infectives    None       Objective   Vitals:   08/06/16 2134 08/07/16 0418 08/07/16 1157 08/07/16 1526  BP:  (!) 155/98 (!) 137/54   Pulse:  (!) 115 95   Resp:  20 20   Temp:  98.2 F (36.8 C) 98.4 F (36.9 C)   TempSrc:  Oral Axillary   SpO2: 98% 97% 100% 96%  Weight:  (!) 153.4 kg (338 lb 3.2 oz)    Height:        Intake/Output Summary (Last 24 hours) at 08/07/16 1820 Last data filed at 08/07/16 1746  Gross per 24 hour  Intake             1080 ml  Output             3290 ml  Net            -2210 ml   Filed Weights   08/05/16 1915 08/05/16 2142 08/07/16 0418  Weight: (!) 213.2 kg (470 lb) (!) 153.3 kg (338 lb) (!) 153.4 kg (338 lb 3.2 oz)   Physical Examination:  General exam: Appears  calm and comfortable, morbidly obese Respiratory system: Clear to auscultation,but limited by patient's body habitus,  Cardiovascular system:  RRR. No  murmurs, rubs, gallops. No pedal edema. GI system: Abdomen is nondistended, soft and nontender. No organomegaly.  Central nervous system. No focal neurological deficits. 5 x 5 power in all extremities. Skin: No rashes, lesions or ulcers. Psychiatry: Alert, oriented x 3.Judgement and insight appear normal. Affect normal. Extremities- swan-neck deformity will of fingers was on the right, patient reports from gout  Data Reviewed: I have personally reviewed following labs and imaging  studies  CBG:  Recent Labs Lab 08/06/16 1640 08/06/16 2056 08/07/16 0615 08/07/16 1137 08/07/16 1624  GLUCAP 128* 155* 131* 112* 122*    CBC:  Recent Labs Lab 08/05/16 1711 08/06/16 0302 08/07/16 0323  WBC 7.7 7.0 8.2  HGB 13.3 11.6* 12.1*  HCT 40.7 36.8* 37.9*  MCV 95.1 94.8 95.9  PLT 132* 136* [id number]    Basic Metabolic Panel:  Recent Labs Lab 08/05/16 1711 08/06/16 0302  NA 141 137  K 3.8 3.5  CL 102 100*  CO2 30 28  GLUCOSE 130* 136*  BUN 10 8  CREATININE 1.06 0.92  CALCIUM 8.8* 8.3*   Cardiac Enzymes:  Recent Labs Lab 08/05/16 2137 08/06/16 0302 08/06/16 0927  TROPONINI <0.03 <0.03 <0.03   BNP (last 3 results)  Recent Labs  08/05/16 1711  BNP 15.2   Studies: Ct Angio Chest Pe W And/or Wo Contrast  Result Date: 08/05/2016 CLINICAL DATA:  Shortness of breath on exertion. EXAM: CT ANGIOGRAPHY CHEST WITH CONTRAST TECHNIQUE: Multidetector CT imaging of the chest was performed using the standard protocol during bolus administration of intravenous contrast. Multiplanar CT image reconstructions and MIPs were obtained to evaluate the vascular anatomy. CONTRAST:  100 cc Isovue 370 COMPARISON:  None. FINDINGS: Examination is very limited due to morbid obesity and significant motion artifact. Chest wall: No chest wall mass, supraclavicular or axillary adenopathy. Small scattered lymph nodes are noted. The thyroid gland is grossly normal. Cardiovascular: The heart is mildly enlarged for age. No pericardial effusion. The thoracic aorta is normal in caliber. No dissection. Scattered atherosclerotic calcifications. Very limited examination due to limited opacification significant breathing motion artifact. However, there does appear to be clot in the right pulmonary artery. Left lower lobe pulmonary artery clot is also possible. No definite findings for right heart strain. Mediastinum/Nodes: No mediastinal or hilar mass or adenopathy. Scattered lymph nodes are noted.  The esophagus is grossly normal. Lungs/Pleura: Screening limited examination of the lungs. There are patchy areas of dependent lower lobe atelectasis but no obvious infiltrates, edema or significant effusions. Upper Abdomen: Very limited examination. Musculoskeletal: No significant bony findings. Suspect DISH or ankylosing spondylitis. Review of the MIP images confirms the above findings. IMPRESSION: 1. Extremely limited examination. 2. Suspect bilateral pulmonary emboli. No definite right heart strain. 3. No obvious acute pulmonary findings. 4. Grossly normal thoracic aorta. Electronically Signed   By: Marijo Sanes M.D.   On: 08/05/2016 18:51    Scheduled Meds: . albuterol  2.5 mg Nebulization QID  . furosemide  40 mg Oral Daily  . insulin aspart  0-9 Units Subcutaneous TID WC  . Rivaroxaban  15 mg Oral BID WC  . [START ON 08/28/2016] rivaroxaban  20 mg Oral Q supper    Time spent: 25 min  Ejiroghene Arlyce Dice, MD   [facility] Pager [phone]. If 7PM-7AM, please contact night-coverage at [email or web address], Office  [phone]  password [email or web address] 08/07/2016, 6:20 PM  LOS:  0 days

## 2016-08-08 DIAGNOSIS — M1000 Idiopathic gout, unspecified site: Secondary | ICD-10-CM | POA: Diagnosis not present

## 2016-08-08 DIAGNOSIS — R69 Illness, unspecified: Secondary | ICD-10-CM | POA: Diagnosis not present

## 2016-08-08 LAB — CBC
HEMATOCRIT: 34.7 % — AB (ref 39.0–52.0)
HEMOGLOBIN: 11.3 g/dL — AB (ref 13.0–17.0)
MCH: 30.8 pg (ref 26.0–34.0)
MCHC: 32.6 g/dL (ref 30.0–36.0)
MCV: 94.6 fL (ref 78.0–100.0)
Platelets: 183 10*3/uL (ref 150–400)
RBC: 3.67 MIL/uL — ABNORMAL LOW (ref 4.22–5.81)
RDW: 14.4 % (ref 11.5–15.5)
WBC: 8.4 10*3/uL (ref 4.0–10.5)

## 2016-08-08 LAB — GLUCOSE, CAPILLARY
GLUCOSE-CAPILLARY: 136 mg/dL — AB (ref 65–99)
GLUCOSE-CAPILLARY: 122 mg/dL — AB (ref 65–99)

## 2016-08-08 MED ORDER — POTASSIUM CHLORIDE CRYS ER 20 MEQ PO TBCR
40.0000 meq | EXTENDED_RELEASE_TABLET | Freq: Once | ORAL | Status: AC
  Administered 2016-08-08: 40 meq via ORAL
  Filled 2016-08-08: qty 2

## 2016-08-08 MED ORDER — ALBUTEROL SULFATE (2.5 MG/3ML) 0.083% IN NEBU: 2.5000 mg | INHALATION_SOLUTION | RESPIRATORY_TRACT | 0 refills | Status: DC | PRN

## 2016-08-08 MED ORDER — RIVAROXABAN (XARELTO) VTE STARTER PACK (15 & 20 MG): ORAL_TABLET | ORAL | 0 refills | Status: DC

## 2016-08-08 NOTE — Evaluation (Signed)
Physical Therapy Evaluation Patient Details Name: Ronnie Hernandez MRN: [id number] DOB: 18-Nov-1960 Today's Date: 08/08/2016   History of Present Illness  Pt is a 56 yo male admitted 08/05/16 with severe dyspnea. Pt was diagnosed with bilateral pulmonary emoblus and started on xarelto. Pt was recently hospitalized in January of this year for a Gout exacerbation. PMH significant for mobid obesity, HTN, GOUT, DM2.   Clinical Impression  Pt presents with the above diagnosis and below deficits. Prior to admission, pt had just returned home from SNF stay earlier this month and he developed significant dyspnea. Pt manages uses his rollator or quad cane throughout home and uses a WC when leaving his home for distance mobility. Pt will benefit from HHPT upon discharge in order to increase his mobility and will benefit from continued acute PT services in order to address below deficits before discharge.     Follow Up Recommendations Home health PT    Equipment Recommendations  Wheelchair (measurements PT);Wheelchair cushion (measurements PT);Other (comment) (bariatric)    Recommendations for Other Services       Precautions / Restrictions Precautions Precautions: Fall Restrictions Weight Bearing Restrictions: No      Mobility  Bed Mobility Overal bed mobility: Needs Assistance Bed Mobility: Supine to Sit     Supine to sit: Min assist     General bed mobility comments: Min A to sit upright at EOB due to mattress in place.   Transfers Overall transfer level: Needs assistance Equipment used: Quad cane Transfers: Sit to/from [facility] Sit to Stand: Min assist;From elevated surface Stand pivot transfers: Min guard       General transfer comment: Min A to power up from seated position and min guard for safety throughout transfer. WBOS during pivot transfer and uses UE's for momentum to move throughout transfer  Ambulation/Gait                Stairs             Wheelchair Mobility    Modified Rankin (Stroke Patients Only)       Balance Overall balance assessment: Needs assistance Sitting-balance support: No upper extremity supported;Feet supported Sitting balance-Leahy Scale: Fair Sitting balance - Comments: Sitting EOB with no back support   Standing balance support: Single extremity supported Standing balance-Leahy Scale: Fair Standing balance comment: relies heavily on cane for transfer                             Pertinent Vitals/Pain Pain Assessment: 0-10 Pain Score: 5  Pain Location: posterior knee LLE Pain Descriptors / Indicators: Sore Pain Intervention(s): Monitored during session;Repositioned    Home Living Family/patient expects to be discharged to:: Private residence Living Arrangements: Alone Available Help at Discharge: Family;Available PRN/intermittently Type of Home: Apartment Home Access: Level entry     Home Layout: One level Home Equipment: Walker - 4 wheels;Bedside commode;Tub bench;Cane - quad;Grab bars - tub/shower;Grab bars - toilet;Hand held shower head;Adaptive equipment      Prior Function Level of Independence: Independent with assistive device(s)         Comments: Amb with rollator for short community distances and uses electric cart at grocery store     [place]: Right    Extremity/Trunk Assessment   Upper Extremity Assessment Upper Extremity Assessment: Overall WFL for tasks assessed (deformities in bilateral hands from arthritis and gout)    Lower Extremity Assessment Lower Extremity Assessment: Generalized  weakness (Hip ER bilaterally)       Communication   Communication: No difficulties  Cognition Arousal/Alertness: Awake/alert Behavior During Therapy: WFL for tasks assessed/performed Overall Cognitive Status: Within Functional Limits for tasks assessed                      General Comments      Exercises      Assessment/Plan    PT Assessment Patient needs continued PT services  PT Problem List Decreased activity tolerance;Decreased balance;Decreased mobility;Decreased knowledge of use of DME;Obesity          PT Treatment Interventions DME instruction;Gait training;Functional mobility training;Therapeutic activities;Therapeutic exercise;Balance training;Patient/family education    PT Goals (Current goals can be found in the Care Plan section)  Acute Rehab PT Goals Patient Stated Goal: to go home and not rehab PT Goal Formulation: With patient Time For Goal Achievement: 08/15/16 Potential to Achieve Goals: Good    Frequency Min 3X/week   Barriers to discharge        Co-evaluation               End of Session   Activity Tolerance: Patient tolerated treatment well Patient left: in chair;with call bell/phone within reach Nurse Communication: Mobility status    Functional Assessment Tool Used: functional mobility assessment, clinical judgement Functional Limitation: Mobility: Walking and moving around Mobility: Walking and Moving Around Current Status [id number]): At least 20 percent but less than 40 percent impaired, limited or restricted Mobility: Walking and Moving Around Goal Status [phone]): 0 percent impaired, limited or restricted    Time: [id number] PT Time Calculation (min) (ACUTE ONLY): 35 min   Charges:   PT Evaluation $PT Eval Moderate Complexity: 1 Procedure PT Treatments $Therapeutic Activity: 8-22 mins   PT G Codes:   PT G-Codes **NOT FOR INPATIENT CLASS** Functional Assessment Tool Used: functional mobility assessment, clinical judgement Functional Limitation: Mobility: Walking and moving around Mobility: Walking and Moving Around Current Status [id number]): At least 20 percent but less than 40 percent impaired, limited or restricted Mobility: Walking and Moving Around Goal Status [phone]): 0 percent impaired, limited or restricted    Scheryl Marten PT, DPT   [id number]  08/08/2016, 2:26 PM

## 2016-08-08 NOTE — Discharge Summary (Signed)
Physician Discharge Summary  Ronnie Hernandez [id number] DOB: 1961-06-05 DOA: 08/05/2016  PCP: [name], NP  Admit date: 08/05/2016 Discharge date: 08/08/2016  Admitted From: Home Disposition: Home    Recommendations for Outpatient Follow-up:  1. Follow up with PCP in 1-2 weeks 2. Please obtain BMP/CBC in one week  Home Health: No Equipment/Devices:No  Discharge Condition: Stable  CODE STATUS: Full  Diet recommendation: Heart Healthy   Brief HPI:  Documented on admission by Dr. Fuller Plan, MD.  56 y.o.malewith medical history significant of HTN, HLD, DM type 2, gout, and morbid obesity; who presents with complaints of shortness of breath over last 2 days. Patient was just recently hospitalized 1/16-1/19 for an acute gout attack. Following the hospitalization the patient was unable to ambulate and required a two-week stay at rehabilitation. Thereafter patient has been at home and has been more sedentary than normal. He reports ambulating with the use of a cane. Symptoms of shortness of breath started acutely and patient reports inability to sleep laying down at night. Tried utilizing albuterol inhaler without any relief of symptoms. Associated symptoms included a mildly productive cough, hot flashes, bilateral leg swelling, and pleuritic chest pain. Patient denies any hemoptysis, dysuria, chills, abdominal pain, nausea, vomiting, diarrhea, or loss of consciousness. Patient's never had blood clots or been on blood thinners previously.  In ED heart rates 104-118, respirations up to the30s,BP maintained, and O2 saturations within normal limits on RA. CT angiogram of the chest revealed signs of bilateral pulmonary emboli.  Discharge Diagnoses:  Principal Problem:   Pulmonary embolus ([name]) Active Problems:   Morbidly obese ([place])   Hypertension   Gout   Type 2 diabetes mellitus [facility])  Hospital course Bilateral pulmonary embolism- Provoked- CTA showed bilateral  punctate emboli, no heart strain on CT chest or on echo. Initially started on IV heparin and transitioned to oral anticoagulation with Xarelto.Patient reports reduced ambulation, during and after recent gouty attack requiring hospital admission- 07/06/16- 07/09/16, with subsequent discharge to rehabilitation for 2 weeks. Reduced ambulation even after discharge from rehabilitation. At this time pt will be treated as a provoked pulmonary embolism to complete 6 months of anticoagulation. Patient is to follow-up with PCP, reassess need for continued anticoagulation. Tachycardia resolved, patient was weaned off O2 and was discharged home on room air . Hypertension-blood pressure is controlled, no antihypertensives except- diuretics 40 mg of Lasix daily. Continue on discharge  Diabetes mellitus-continue sliding scale insulin with NovoLog History of gout- stable, continue colchicine. Also on prednisone.  Reported wheezing per pt, with mild SOB - hx of asthma, never smoker. Possibly exacerbated by PE. Continue home when necessary albuterol .  Discharge Instructions  Discharge Instructions    Diet - low sodium heart healthy    Complete by:  As directed    Discharge instructions    Complete by:  As directed    Please follow up with your primary care doctor in 1-2 weeks.   Increase activity slowly    Complete by:  As directed      Allergies as of 08/08/2016      Reactions   Uloric [febuxostat] Other (See Comments)   Made the skin on feet and legs "hurt"      Medication List    STOP taking these medications   cephALEXin 500 MG capsule Commonly known as:  KEFLEX     TAKE these medications   albuterol 108 (90 Base) MCG/ACT inhaler Commonly known as:  PROVENTIL HFA;VENTOLIN HFA Inhale 1-2 puffs  into the lungs every 6 (six) hours as needed for wheezing or shortness of breath. What changed:  Another medication with the same name was added. Make sure you understand how and when to take each.    albuterol (2.5 MG/3ML) 0.083% nebulizer solution Commonly known as:  PROVENTIL Take 3 mLs (2.5 mg total) by nebulization every 2 (two) hours as needed for wheezing. What changed:  You were already taking a medication with the same name, and this prescription was added. Make sure you understand how and when to take each.   colchicine 0.6 MG tablet TAKE 1 TABLET (0.6 MG TOTAL) BY MOUTH 2 (TWO) TIMES DAILY as needed for gouty attack/pain   furosemide 20 MG tablet Commonly known as:  LASIX Take 2 tablets (40 mg total) by mouth daily.   insulin aspart 100 UNIT/ML injection Commonly known as:  novoLOG CBG 70-120--no units; 121-150--3 units; 151-200--4 units; 201-250--7 units; 251-300--11 units; 301-350--15 units; 351-400--20 units; >400--call MD CHECK CBG before each meal   insulin aspart 100 UNIT/ML injection Commonly known as:  novoLOG CHECK CBGs at bedtime CBG 0-200--NO units; 201-250--2 units; 251-300--3 units; 301-350--4 units; 351-400--5 units; >400--call MD   predniSONE 10 MG tablet Commonly known as:  DELTASONE Take 6 tablets (60 mg total) by mouth daily with breakfast. And decrease by one tablet daily   Rivaroxaban 15 & 20 MG Tbpk Take as directed on package: Start with one 15mg  tablet by mouth twice a day with food. On Day 22, switch to one 20mg  tablet once a day with food.       Allergies  Allergen Reactions  . Uloric [Febuxostat] Other (See Comments)    Made the skin on feet and legs "hurt"    Consultations:  None   Procedures/Studies: Dg Chest 2 View  Result Date: 08/05/2016 CLINICAL DATA:  Shortness of breath, cough, wheezing and right chest pain since yesterday. EXAM: CHEST  2 VIEW COMPARISON:  PA and lateral chest 06/20/2016 and 01/26/2016. FINDINGS: Lungs are clear. Heart size is mildly enlarged. No pneumothorax or pleural effusion. No acute bony abnormality. IMPRESSION: No acute disease. Electronically Signed   By: Inge Rise M.D.   On: 08/05/2016 16:38    Ct Angio Chest Pe W And/or Wo Contrast  Result Date: 08/05/2016 CLINICAL DATA:  Shortness of breath on exertion. EXAM: CT ANGIOGRAPHY CHEST WITH CONTRAST TECHNIQUE: Multidetector CT imaging of the chest was performed using the standard protocol during bolus administration of intravenous contrast. Multiplanar CT image reconstructions and MIPs were obtained to evaluate the vascular anatomy. CONTRAST:  100 cc Isovue 370 COMPARISON:  None. FINDINGS: Examination is very limited due to morbid obesity and significant motion artifact. Chest wall: No chest wall mass, supraclavicular or axillary adenopathy. Small scattered lymph nodes are noted. The thyroid gland is grossly normal. Cardiovascular: The heart is mildly enlarged for age. No pericardial effusion. The thoracic aorta is normal in caliber. No dissection. Scattered atherosclerotic calcifications. Very limited examination due to limited opacification significant breathing motion artifact. However, there does appear to be clot in the right pulmonary artery. Left lower lobe pulmonary artery clot is also possible. No definite findings for right heart strain. Mediastinum/Nodes: No mediastinal or hilar mass or adenopathy. Scattered lymph nodes are noted. The esophagus is grossly normal. Lungs/Pleura: Screening limited examination of the lungs. There are patchy areas of dependent lower lobe atelectasis but no obvious infiltrates, edema or significant effusions. Upper Abdomen: Very limited examination. Musculoskeletal: No significant bony findings. Suspect DISH or ankylosing spondylitis. Review  of the MIP images confirms the above findings. IMPRESSION: 1. Extremely limited examination. 2. Suspect bilateral pulmonary emboli. No definite right heart strain. 3. No obvious acute pulmonary findings. 4. Grossly normal thoracic aorta. Electronically Signed   By: Marijo Sanes M.D.   On: 08/05/2016 18:51     ECHO- 08/06/16. Study Conclusions  - Left ventricle: The cavity  size was normal. Wall thickness was   normal. Systolic function was normal. The estimated ejection   fraction was in the range of 60% to 65%. Wall motion was normal;   there were no regional wall motion abnormalities. Doppler   parameters are consistent with a reversible restrictive pattern,   indicative of decreased left ventricular diastolic compliance   and/or increased left atrial pressure (grade 3 diastolic   dysfunction).  Subjective: No complaints today ready to go home. Off 2 L of O2 now on room air.   Discharge Exam: Vitals:   08/07/16 2123 08/08/16 0522  BP: (!) 117/47 (!) 135/56  Pulse: 100 95  Resp:  (!) 22  Temp: 99.3 F (37.4 C) 98.8 F (37.1 C)   Vitals:   08/07/16 1157 08/07/16 1526 08/07/16 2123 08/08/16 0522  BP: (!) 137/54  (!) 117/47 (!) 135/56  Pulse: 95  100 95  Resp: 20   (!) 22  Temp: 98.4 F (36.9 C)  99.3 F (37.4 C) 98.8 F (37.1 C)  TempSrc: Axillary  Oral Oral  SpO2: 100% 96% 100% 100%  Weight:      Height:        General: Pt is alert, awake, not in acute distress, on room air morbidly obese  Cardiovascular: RRR, S1/S2 +, no rubs, no gallops Respiratory: CTA bilaterally, no wheezing, no rhonchi Abdominal: Soft, NT, ND, bowel sounds + Extremities: chronic stasis changes on bilateral lower extremities    The results of significant diagnostics from this hospitalization (including imaging, microbiology, ancillary and laboratory) are listed below for reference.    Labs: BNP (last 3 results)  Recent Labs  08/05/16 1711  BNP [id number]   Basic Metabolic Panel:  Recent Labs Lab 08/05/16 1711 08/06/16 0302  NA 141 137  K 3.8 3.5  CL 102 100*  CO2 30 28  GLUCOSE 130* 136*  BUN 10 8  CREATININE 1.06 0.92  CALCIUM 8.8* 8.3*   CBC:  Recent Labs Lab 08/05/16 1711 08/06/16 0302 08/07/16 0323 08/08/16 0240  WBC 7.7 7.0 8.2 8.4  HGB 13.3 11.6* 12.1* 11.3*  HCT 40.7 36.8* 37.9* 34.7*  MCV 95.1 94.8 95.9 94.6  PLT 132* 136* 159 183    Cardiac Enzymes:  Recent Labs Lab 08/05/16 2137 08/06/16 0302 08/06/16 0927  TROPONINI <0.03 <0.03 <0.03   BNP: Invalid input(s): POCBNP CBG:  Recent Labs Lab 08/07/16 1137 08/07/16 1624 08/07/16 2108 08/08/16 0611 08/08/16 1134  GLUCAP 112* 122* 145* 136* 122*   Urinalysis    Component Value Date/Time   COLORURINE AMBER (A) 07/06/2016 1251   APPEARANCEUR CLEAR 07/06/2016 1251   APPEARANCEUR Clear 12/05/2015 1226   LABSPEC 1.017 07/06/2016 1251   PHURINE 5.0 07/06/2016 1251   GLUCOSEU NEGATIVE 07/06/2016 1251   HGBUR NEGATIVE 07/06/2016 1251   BILIRUBINUR NEGATIVE 07/06/2016 1251   BILIRUBINUR Negative 12/05/2015 1226   KETONESUR NEGATIVE 07/06/2016 1251   PROTEINUR NEGATIVE 07/06/2016 1251   UROBILINOGEN 0.2 12/28/2013 1117   NITRITE NEGATIVE 07/06/2016 1251   LEUKOCYTESUR NEGATIVE 07/06/2016 1251   LEUKOCYTESUR Negative 12/05/2015 1226   Time coordinating discharge: Over 30 minutes  SIGNED:  Bethena Roys, MD  [facility] 08/08/2016, 1:14 PM Pager [phone]  If 7PM-7AM, please contact night-coverage [email or web address] Password [email or web address]

## 2016-08-09 ENCOUNTER — Ambulatory Visit: Payer: Medicare HMO | Admitting: Nurse Practitioner

## 2016-08-10 ENCOUNTER — Ambulatory Visit (HOSPITAL_COMMUNITY): Admit: 2016-08-10 | Payer: Medicare HMO | Admitting: Internal Medicine

## 2016-08-10 ENCOUNTER — Encounter (HOSPITAL_COMMUNITY): Payer: Self-pay

## 2016-08-10 ENCOUNTER — Telehealth: Payer: Self-pay | Admitting: *Deleted

## 2016-08-10 SURGERY — COLONOSCOPY WITH PROPOFOL
Anesthesia: Monitor Anesthesia Care

## 2016-08-10 NOTE — Telephone Encounter (Signed)
Patient called and stated that he was just released from the hospital Sunday with Nebulizer medication but NO machine. Patient doesn't have an appointment for hospital follow up until 08-19-16. Patient stated he needs a Rx for a machine to use his medications. Please Advise.

## 2016-08-11 NOTE — Telephone Encounter (Signed)
Patient stated that he currently does not have [facility] but would like Home Health and wonders if we can have them go out to his home. And then send Rx for Neb. Please Advise.

## 2016-08-11 NOTE — Telephone Encounter (Signed)
Yes but needs OV for this. See if one of the DME companies that come to office can set him up in the meantime

## 2016-08-11 NOTE — Telephone Encounter (Signed)
Does he have home health? If so okay to send Rx for nebulizer to company, otherwise we will need to set this up

## 2016-08-12 DIAGNOSIS — J45998 Other asthma: Secondary | ICD-10-CM | POA: Diagnosis not present

## 2016-08-12 NOTE — Telephone Encounter (Signed)
Filled out [employer] form to supply him with a Nebulizer from our office. Given to Alexander to finish filling out and sign. Attached Insurance Cards and to be faxed to the [employer] at Fax#: [phone]

## 2016-08-13 NOTE — Telephone Encounter (Signed)
Neb and form left up front. Explained paperwork to [employer]. She will have patient sign form on Monday and give back to me to fax to company. Will give Nebulizer to patient.

## 2016-08-19 ENCOUNTER — Ambulatory Visit (INDEPENDENT_AMBULATORY_CARE_PROVIDER_SITE_OTHER): Payer: Medicare HMO | Admitting: Nurse Practitioner

## 2016-08-19 ENCOUNTER — Encounter: Payer: Self-pay | Admitting: Nurse Practitioner

## 2016-08-19 VITALS — BP 144/84 | HR 112 | Temp 97.5°F | Ht 68.0 in | Wt >= 6400 oz

## 2016-08-19 DIAGNOSIS — R600 Localized edema: Secondary | ICD-10-CM

## 2016-08-19 DIAGNOSIS — E1165 Type 2 diabetes mellitus with hyperglycemia: Secondary | ICD-10-CM | POA: Diagnosis not present

## 2016-08-19 DIAGNOSIS — R5381 Other malaise: Secondary | ICD-10-CM

## 2016-08-19 DIAGNOSIS — M1A9XX1 Chronic gout, unspecified, with tophus (tophi): Secondary | ICD-10-CM

## 2016-08-19 DIAGNOSIS — I2699 Other pulmonary embolism without acute cor pulmonale: Secondary | ICD-10-CM

## 2016-08-19 DIAGNOSIS — I10 Essential (primary) hypertension: Secondary | ICD-10-CM | POA: Diagnosis not present

## 2016-08-19 DIAGNOSIS — IMO0001 Reserved for inherently not codable concepts without codable children: Secondary | ICD-10-CM

## 2016-08-19 LAB — CBC WITH DIFFERENTIAL/PLATELET
BASOS ABS: 0 {cells}/uL (ref 0–200)
Basophils Relative: 0 %
EOS PCT: 3 %
Eosinophils Absolute: 183 cells/uL (ref 15–500)
HCT: 40.9 % (ref 38.5–50.0)
Hemoglobin: 13.5 g/dL (ref 13.2–17.1)
LYMPHS PCT: 33 %
Lymphs Abs: 2013 cells/uL (ref 850–3900)
MCH: 30.9 pg (ref 27.0–33.0)
MCHC: 33 g/dL (ref 32.0–36.0)
MCV: 93.6 fL (ref 80.0–100.0)
MONOS PCT: 7 %
MPV: 10.4 fL (ref 7.5–12.5)
Monocytes Absolute: 427 cells/uL (ref 200–950)
Neutro Abs: 3477 cells/uL (ref 1500–7800)
Neutrophils Relative %: 57 %
PLATELETS: 373 10*3/uL (ref 140–400)
RBC: 4.37 MIL/uL (ref 4.20–5.80)
RDW: 15 % (ref 11.0–15.0)
WBC: 6.1 10*3/uL (ref 3.8–10.8)

## 2016-08-19 LAB — BASIC METABOLIC PANEL WITH GFR
BUN: 17 mg/dL (ref 7–25)
CALCIUM: 9.1 mg/dL (ref 8.6–10.3)
CO2: 26 mmol/L (ref 20–31)
Chloride: 105 mmol/L (ref 98–110)
Creat: 1.14 mg/dL (ref 0.70–1.33)
GFR, EST AFRICAN AMERICAN: 83 mL/min (ref >=60)
GFR, EST NON AFRICAN AMERICAN: 71 mL/min (ref >=60)
GLUCOSE: 114 mg/dL — AB (ref 65–99)
Potassium: 4.2 mmol/L (ref 3.5–5.3)
SODIUM: 141 mmol/L (ref 135–146)

## 2016-08-19 MED ORDER — RIVAROXABAN 20 MG PO TABS: 20.0000 mg | ORAL_TABLET | Freq: Every day | ORAL | 3 refills | Status: DC

## 2016-08-19 NOTE — Progress Notes (Signed)
Careteam: [name], NP as PCP - General (Nurse Practitioner)  Advanced Directive information Does Patient Have a Medical Advance Directive?: No, Would patient like information on creating a medical advance directive?: No - Patient declined  Allergies  Allergen Reactions  . Uloric [Febuxostat] Other (See Comments)    Made the skin on feet and legs "hurt"    Chief Complaint  Patient presents with  . Follow-up     In Hospital 08/05/16-08/08/16     HPI: Patient is a 56 y.o. male seen in the office today for follow up on hospitalization  Pt has had 2 hospitalizations since last visit.  From 07-06-16-07-09-16 was hospitalized due to acute gout after initiation of Uloric. Placed on solumedrol to prednisone taper. Pt also had cellultiis of left lower leg and was placed on keflex. He was then transferred to [facility] for inpatient rehab then when he got home he became acutely short of breath so he went to the ED. In the ED he was diagnosed with Bilateral pulmonary embolism- Provoked- CTA showed bilateral punctate emboli, no heart strain on CT chest or on echo. Initially started on IV heparin and transitioned to oral anticoagulation with Xarelto.  At this time pt will be treated as a provoked pulmonary embolism to complete 6 months of anticoagulation.  pt had wheezing while in hospital, which is thought to wbe due to PE. Overall wheezing and shortness of breath has significantly improved. He feels good today. Went out to his car and feels like his strength is coming back but has not has PT ordered.   Review of Systems:  Review of Systems  Constitutional: Negative for activity change, appetite change, chills and fever.  HENT: Negative for congestion, ear pain, rhinorrhea, sinus pain, sinus pressure, sneezing and sore throat.   Eyes: Negative.   Respiratory: Negative for cough, chest tightness, shortness of breath and wheezing.   Cardiovascular: Positive for leg  swelling (improved). Negative for chest pain and palpitations.  Gastrointestinal: Negative for abdominal distention, abdominal pain, constipation, diarrhea, nausea and vomiting.  Endocrine: Negative.   Genitourinary: Negative for dysuria, flank pain, frequency and urgency.  Skin: Negative for pallor and rash.  Neurological: Negative for dizziness, syncope, light-headedness and headaches.  Psychiatric/Behavioral: Negative for confusion and sleep disturbance. The patient is not nervous/anxious.     Past Medical History:  Diagnosis Date  . Arthritis    right fingers, both knees  . Diabetes type 2, uncontrolled ([place])   . Gout of hand   . Hyperlipidemia   . Hypertension   . Morbidly obese [facility])    Past Surgical History:  Procedure Laterality Date  . unremarkable     Social History:   reports that he quit smoking about 34 years ago. He has never used smokeless tobacco. He reports that he does not drink alcohol or use drugs.  Family History  Problem Relation Age of Onset  . Colon cancer Maternal Grandfather 52  . Heart attack Father   . Stomach cancer Maternal Aunt 80    Medications: Patient's Medications  New Prescriptions   No medications on file  Previous Medications   ALBUTEROL (PROVENTIL HFA;VENTOLIN HFA) 108 (90 BASE) MCG/ACT INHALER    Inhale 1-2 puffs into the lungs every 6 (six) hours as needed for wheezing or shortness of breath.   ALBUTEROL (PROVENTIL) (2.5 MG/3ML) 0.083% NEBULIZER SOLUTION    Take 3 mLs (2.5 mg total) by nebulization every 2 (two) hours as needed for wheezing.  COLCHICINE 0.6 MG TABLET    TAKE 1 TABLET (0.6 MG TOTAL) BY MOUTH 2 (TWO) TIMES DAILY as needed for gouty attack/pain   FUROSEMIDE (LASIX) 20 MG TABLET    Take 2 tablets (40 mg total) by mouth daily.   RIVAROXABAN 15 & 20 MG TBPK    Take as directed on package: Start with one 55m tablet by mouth twice a day with food. On Day 22, switch to one 216mtablet once a day with food.  Modified  Medications   No medications on file  Discontinued Medications   PREDNISONE (DELTASONE) 10 MG TABLET    Take 6 tablets (60 mg total) by mouth daily with breakfast. And decrease by one tablet daily     Physical Exam:  Vitals:   08/19/16 1454  BP: (!) 144/84  Pulse: (!) 119  Temp: 97.5 F (36.4 C)  TempSrc: Oral  SpO2: 98%  Weight: (!) 459 lb 9.6 oz (208.5 kg)  Height: _0  (1.727 m)   Body mass index is 69.88 kg/m.  Physical Exam  Constitutional: He is oriented to person, place, and time. He appears well-developed and well-nourished. No distress.  Morbidly Obese male  HENT:  Head: Normocephalic and atraumatic.  Right Ear: Tympanic membrane normal.  Left Ear: Tympanic membrane, external ear and ear canal normal.  Eyes: Conjunctivae and EOM are normal. Pupils are equal, round, and reactive to light.  Neck: Normal range of motion. Neck supple.  Cardiovascular: Normal rate, regular rhythm and normal heart sounds.   Pulmonary/Chest: Effort normal and breath sounds normal. He has no wheezes. He has no rales.  Abdominal: Soft. Bowel sounds are normal. He exhibits no distension. There is no tenderness.  Obese abdomen   Musculoskeletal: He exhibits edema (2+ bilaterally) and deformity. He exhibits no tenderness.  Has arthritis changes to his digits in his hands, tophi in index finger Abnormal gait due to size and joint stiffness  Neurological: He is alert and oriented to person, place, and time.  Skin: Skin is warm and dry. He is not diaphoretic.  Psychiatric: He has a normal mood and affect. His behavior is normal. Judgment and thought content normal.    Labs reviewed: Basic Metabolic Panel:  Recent Labs  07/06/16 2004  07/08/16 1424 07/09/16 0429 08/05/16 1711 08/06/16 0302  NA  --   < > 131* 133* 141 137  K  --   < > 4.5 5.0 3.8 3.5  CL  --   < > 99* 101 102 100*  CO2  --   < > _1 GLUCOSE  --   < > 333* 264* 130* 136*  BUN  --   < > 45* 43* 10 8    CREATININE 0.99  < > 1.16 1.01 1.06 0.92  CALCIUM  --   < > 8.1* 7.9* 8.8* 8.3*  MG 1.9  --  2.2  --   --   --   PHOS 3.4  --   --   --   --   --   < > = values in this interval not displayed. Liver Function Tests:  Recent Labs  01/27/16 0304 03/01/16 1100 06/17/16 1425  AST _2 ALT _3 ALKPHOS 86 88 102  BILITOT 0.5 0.4 0.6  PROT 6.5 6.9 7.1  ALBUMIN 2.9* 3.6 3.6    Recent Labs  03/01/16 1100  LIPASE 24  AMYLASE 34   No results for input(s): AMMONIA in  the last 8760 hours. CBC:  Recent Labs  01/28/16 0510  03/01/16 1100  07/06/16 1159  08/06/16 0302 08/07/16 0323 08/08/16 0240  WBC 10.1  < > 7.4  < > 22.6*  < > 7.0 8.2 8.4  NEUTROABS 7.4  --  4,514  --  19.3*  --   --   --   --   HGB 11.9*  < > 13.5  < > 12.5*  < > 11.6* 12.1* 11.3*  HCT 36.7*  < > 40.5  < > 36.7*  < > 36.8* 37.9* 34.7*  MCV 95.6  < > 92.7  < > 93.4  < > 94.8 95.9 94.6  PLT 163  < > 175  < > 217  < > 136* 159 183  < > = values in this interval not displayed. Lipid Panel:  Recent Labs  12/05/15 1226 03/11/16 0940 07/07/16 0359  CHOL 179 148 145  HDL 41 47 31*  LDLCALC 122* 76 97  TRIG 82 125 87  CHOLHDL 4.4 3.1 4.7   TSH: No results for input(s): TSH in the last 8760 hours. A1C: Lab Results  Component Value Date   HGBA1C 6.0 (H) 07/06/2016     Assessment/Plan .1. Essential hypertension -stable conts on lasix only -encouraged lifestyle modifications.   2. Other acute pulmonary embolism without acute cor pulmonale (HCC) Provoked due to immobility, conts on xarelto for 6 months.  - BMP with eGFR - CBC with Differential/Platelets - rivaroxaban (XARELTO) 20 MG TABS tablet; Take 1 tablet (20 mg total) by mouth daily with supper.  Dispense: 30 tablet; Refill: 3 - Ambulatory referral to [place] with Differential/Platelets; Future - CMP with eGFR; Future  3. Uncontrolled type 2 diabetes mellitus without complication, without long-term current use of  insulin (HCC) -A1c 6.0 on last labs, elevated blood sugars due to steroid taper.  - Hemoglobin A1c; Future  4. Gout with tophi conts on colchicine 0.6 mg BID PRN - Ambulatory referral to [place] - Uric acid; Future  5. Lower leg edema -stable, conts on lasix. Will follow up BMP - Ambulatory referral to [place]  6. Physical deconditioning Feels like he is very deconditioned after back to back hospitalization. First time leaving house today.  - Ambulatory referral to [place]. Harle Battiest  [facility] [phone] 8 am - 5 pm) [phone] (after hours)

## 2016-08-30 DIAGNOSIS — I2699 Other pulmonary embolism without acute cor pulmonale: Secondary | ICD-10-CM | POA: Diagnosis not present

## 2016-08-30 DIAGNOSIS — R2681 Unsteadiness on feet: Secondary | ICD-10-CM | POA: Diagnosis not present

## 2016-08-30 DIAGNOSIS — E119 Type 2 diabetes mellitus without complications: Secondary | ICD-10-CM | POA: Diagnosis not present

## 2016-08-30 DIAGNOSIS — R600 Localized edema: Secondary | ICD-10-CM | POA: Diagnosis not present

## 2016-08-30 DIAGNOSIS — M6281 Muscle weakness (generalized): Secondary | ICD-10-CM | POA: Diagnosis not present

## 2016-09-03 DIAGNOSIS — R2681 Unsteadiness on feet: Secondary | ICD-10-CM | POA: Diagnosis not present

## 2016-09-03 DIAGNOSIS — E119 Type 2 diabetes mellitus without complications: Secondary | ICD-10-CM | POA: Diagnosis not present

## 2016-09-03 DIAGNOSIS — M6281 Muscle weakness (generalized): Secondary | ICD-10-CM | POA: Diagnosis not present

## 2016-09-03 DIAGNOSIS — I2699 Other pulmonary embolism without acute cor pulmonale: Secondary | ICD-10-CM | POA: Diagnosis not present

## 2016-09-06 DIAGNOSIS — I2699 Other pulmonary embolism without acute cor pulmonale: Secondary | ICD-10-CM | POA: Diagnosis not present

## 2016-09-06 DIAGNOSIS — E119 Type 2 diabetes mellitus without complications: Secondary | ICD-10-CM | POA: Diagnosis not present

## 2016-09-06 DIAGNOSIS — R2681 Unsteadiness on feet: Secondary | ICD-10-CM | POA: Diagnosis not present

## 2016-09-06 DIAGNOSIS — M6281 Muscle weakness (generalized): Secondary | ICD-10-CM | POA: Diagnosis not present

## 2016-09-08 DIAGNOSIS — E119 Type 2 diabetes mellitus without complications: Secondary | ICD-10-CM | POA: Diagnosis not present

## 2016-09-08 DIAGNOSIS — R2681 Unsteadiness on feet: Secondary | ICD-10-CM | POA: Diagnosis not present

## 2016-09-08 DIAGNOSIS — I2699 Other pulmonary embolism without acute cor pulmonale: Secondary | ICD-10-CM | POA: Diagnosis not present

## 2016-09-08 DIAGNOSIS — M6281 Muscle weakness (generalized): Secondary | ICD-10-CM | POA: Diagnosis not present

## 2016-09-09 DIAGNOSIS — J45998 Other asthma: Secondary | ICD-10-CM | POA: Diagnosis not present

## 2016-09-13 DIAGNOSIS — R2681 Unsteadiness on feet: Secondary | ICD-10-CM | POA: Diagnosis not present

## 2016-09-13 DIAGNOSIS — M6281 Muscle weakness (generalized): Secondary | ICD-10-CM | POA: Diagnosis not present

## 2016-09-13 DIAGNOSIS — I2699 Other pulmonary embolism without acute cor pulmonale: Secondary | ICD-10-CM | POA: Diagnosis not present

## 2016-09-13 DIAGNOSIS — E119 Type 2 diabetes mellitus without complications: Secondary | ICD-10-CM | POA: Diagnosis not present

## 2016-09-20 DIAGNOSIS — E119 Type 2 diabetes mellitus without complications: Secondary | ICD-10-CM | POA: Diagnosis not present

## 2016-09-20 DIAGNOSIS — R2681 Unsteadiness on feet: Secondary | ICD-10-CM | POA: Diagnosis not present

## 2016-09-20 DIAGNOSIS — I2699 Other pulmonary embolism without acute cor pulmonale: Secondary | ICD-10-CM | POA: Diagnosis not present

## 2016-09-20 DIAGNOSIS — M6281 Muscle weakness (generalized): Secondary | ICD-10-CM | POA: Diagnosis not present

## 2016-09-22 DIAGNOSIS — E119 Type 2 diabetes mellitus without complications: Secondary | ICD-10-CM | POA: Diagnosis not present

## 2016-09-22 DIAGNOSIS — M6281 Muscle weakness (generalized): Secondary | ICD-10-CM | POA: Diagnosis not present

## 2016-09-22 DIAGNOSIS — I2699 Other pulmonary embolism without acute cor pulmonale: Secondary | ICD-10-CM | POA: Diagnosis not present

## 2016-09-22 DIAGNOSIS — R2681 Unsteadiness on feet: Secondary | ICD-10-CM | POA: Diagnosis not present

## 2016-09-24 DIAGNOSIS — M6281 Muscle weakness (generalized): Secondary | ICD-10-CM | POA: Diagnosis not present

## 2016-09-24 DIAGNOSIS — E119 Type 2 diabetes mellitus without complications: Secondary | ICD-10-CM | POA: Diagnosis not present

## 2016-09-24 DIAGNOSIS — R2681 Unsteadiness on feet: Secondary | ICD-10-CM | POA: Diagnosis not present

## 2016-09-24 DIAGNOSIS — I2699 Other pulmonary embolism without acute cor pulmonale: Secondary | ICD-10-CM | POA: Diagnosis not present

## 2016-09-29 DIAGNOSIS — M6281 Muscle weakness (generalized): Secondary | ICD-10-CM | POA: Diagnosis not present

## 2016-09-29 DIAGNOSIS — R2681 Unsteadiness on feet: Secondary | ICD-10-CM | POA: Diagnosis not present

## 2016-09-29 DIAGNOSIS — E119 Type 2 diabetes mellitus without complications: Secondary | ICD-10-CM | POA: Diagnosis not present

## 2016-09-29 DIAGNOSIS — I2699 Other pulmonary embolism without acute cor pulmonale: Secondary | ICD-10-CM | POA: Diagnosis not present

## 2016-09-30 ENCOUNTER — Ambulatory Visit: Payer: Medicare HMO | Admitting: Nurse Practitioner

## 2016-10-01 DIAGNOSIS — M6281 Muscle weakness (generalized): Secondary | ICD-10-CM | POA: Diagnosis not present

## 2016-10-01 DIAGNOSIS — R2681 Unsteadiness on feet: Secondary | ICD-10-CM | POA: Diagnosis not present

## 2016-10-01 DIAGNOSIS — E119 Type 2 diabetes mellitus without complications: Secondary | ICD-10-CM | POA: Diagnosis not present

## 2016-10-01 DIAGNOSIS — I2699 Other pulmonary embolism without acute cor pulmonale: Secondary | ICD-10-CM | POA: Diagnosis not present

## 2016-10-06 DIAGNOSIS — I2699 Other pulmonary embolism without acute cor pulmonale: Secondary | ICD-10-CM | POA: Diagnosis not present

## 2016-10-06 DIAGNOSIS — M6281 Muscle weakness (generalized): Secondary | ICD-10-CM | POA: Diagnosis not present

## 2016-10-06 DIAGNOSIS — R2681 Unsteadiness on feet: Secondary | ICD-10-CM | POA: Diagnosis not present

## 2016-10-06 DIAGNOSIS — E119 Type 2 diabetes mellitus without complications: Secondary | ICD-10-CM | POA: Diagnosis not present

## 2016-10-08 DIAGNOSIS — R2681 Unsteadiness on feet: Secondary | ICD-10-CM | POA: Diagnosis not present

## 2016-10-08 DIAGNOSIS — M6281 Muscle weakness (generalized): Secondary | ICD-10-CM | POA: Diagnosis not present

## 2016-10-08 DIAGNOSIS — E119 Type 2 diabetes mellitus without complications: Secondary | ICD-10-CM | POA: Diagnosis not present

## 2016-10-08 DIAGNOSIS — I2699 Other pulmonary embolism without acute cor pulmonale: Secondary | ICD-10-CM | POA: Diagnosis not present

## 2016-10-10 DIAGNOSIS — J45998 Other asthma: Secondary | ICD-10-CM | POA: Diagnosis not present

## 2016-10-13 DIAGNOSIS — E119 Type 2 diabetes mellitus without complications: Secondary | ICD-10-CM | POA: Diagnosis not present

## 2016-10-13 DIAGNOSIS — R2681 Unsteadiness on feet: Secondary | ICD-10-CM | POA: Diagnosis not present

## 2016-10-13 DIAGNOSIS — I2699 Other pulmonary embolism without acute cor pulmonale: Secondary | ICD-10-CM | POA: Diagnosis not present

## 2016-10-13 DIAGNOSIS — M6281 Muscle weakness (generalized): Secondary | ICD-10-CM | POA: Diagnosis not present

## 2016-10-15 DIAGNOSIS — I2699 Other pulmonary embolism without acute cor pulmonale: Secondary | ICD-10-CM | POA: Diagnosis not present

## 2016-10-15 DIAGNOSIS — R2681 Unsteadiness on feet: Secondary | ICD-10-CM | POA: Diagnosis not present

## 2016-10-15 DIAGNOSIS — M6281 Muscle weakness (generalized): Secondary | ICD-10-CM | POA: Diagnosis not present

## 2016-10-15 DIAGNOSIS — E119 Type 2 diabetes mellitus without complications: Secondary | ICD-10-CM | POA: Diagnosis not present

## 2016-10-20 DIAGNOSIS — R2681 Unsteadiness on feet: Secondary | ICD-10-CM | POA: Diagnosis not present

## 2016-10-20 DIAGNOSIS — E119 Type 2 diabetes mellitus without complications: Secondary | ICD-10-CM | POA: Diagnosis not present

## 2016-10-20 DIAGNOSIS — I2699 Other pulmonary embolism without acute cor pulmonale: Secondary | ICD-10-CM | POA: Diagnosis not present

## 2016-10-20 DIAGNOSIS — M6281 Muscle weakness (generalized): Secondary | ICD-10-CM | POA: Diagnosis not present

## 2016-10-22 DIAGNOSIS — R2681 Unsteadiness on feet: Secondary | ICD-10-CM | POA: Diagnosis not present

## 2016-10-22 DIAGNOSIS — E119 Type 2 diabetes mellitus without complications: Secondary | ICD-10-CM | POA: Diagnosis not present

## 2016-10-22 DIAGNOSIS — I2699 Other pulmonary embolism without acute cor pulmonale: Secondary | ICD-10-CM | POA: Diagnosis not present

## 2016-10-22 DIAGNOSIS — M6281 Muscle weakness (generalized): Secondary | ICD-10-CM | POA: Diagnosis not present

## 2016-10-25 DIAGNOSIS — I2699 Other pulmonary embolism without acute cor pulmonale: Secondary | ICD-10-CM | POA: Diagnosis not present

## 2016-10-25 DIAGNOSIS — E119 Type 2 diabetes mellitus without complications: Secondary | ICD-10-CM | POA: Diagnosis not present

## 2016-10-25 DIAGNOSIS — M6281 Muscle weakness (generalized): Secondary | ICD-10-CM | POA: Diagnosis not present

## 2016-10-25 DIAGNOSIS — R2681 Unsteadiness on feet: Secondary | ICD-10-CM | POA: Diagnosis not present

## 2016-10-26 DIAGNOSIS — R2681 Unsteadiness on feet: Secondary | ICD-10-CM | POA: Diagnosis not present

## 2016-10-26 DIAGNOSIS — M6281 Muscle weakness (generalized): Secondary | ICD-10-CM | POA: Diagnosis not present

## 2016-10-26 DIAGNOSIS — I2699 Other pulmonary embolism without acute cor pulmonale: Secondary | ICD-10-CM | POA: Diagnosis not present

## 2016-10-26 DIAGNOSIS — E119 Type 2 diabetes mellitus without complications: Secondary | ICD-10-CM | POA: Diagnosis not present

## 2016-11-09 DIAGNOSIS — J45998 Other asthma: Secondary | ICD-10-CM | POA: Diagnosis not present

## 2016-11-20 ENCOUNTER — Emergency Department (HOSPITAL_COMMUNITY)
Admission: EM | Admit: 2016-11-20 | Discharge: 2016-11-20 | Disposition: A | Discharge status: Home / Self Care | Payer: Medicare HMO | Attending: Emergency Medicine | Admitting: Emergency Medicine

## 2016-11-20 ENCOUNTER — Encounter (HOSPITAL_COMMUNITY): Payer: Self-pay | Admitting: Emergency Medicine

## 2016-11-20 DIAGNOSIS — M109 Gout, unspecified: Secondary | ICD-10-CM | POA: Insufficient documentation

## 2016-11-20 DIAGNOSIS — E119 Type 2 diabetes mellitus without complications: Secondary | ICD-10-CM | POA: Diagnosis not present

## 2016-11-20 DIAGNOSIS — L03114 Cellulitis of left upper limb: Secondary | ICD-10-CM

## 2016-11-20 DIAGNOSIS — Z79899 Other long term (current) drug therapy: Secondary | ICD-10-CM | POA: Insufficient documentation

## 2016-11-20 DIAGNOSIS — I10 Essential (primary) hypertension: Secondary | ICD-10-CM | POA: Diagnosis not present

## 2016-11-20 DIAGNOSIS — Z87891 Personal history of nicotine dependence: Secondary | ICD-10-CM | POA: Diagnosis not present

## 2016-11-20 MED ORDER — DEXAMETHASONE SODIUM PHOSPHATE 10 MG/ML IJ SOLN
10.0000 mg | Freq: Once | INTRAMUSCULAR | Status: AC
Start: 2016-11-20 — End: 2016-11-20
  Administered 2016-11-20: 10 mg via INTRAMUSCULAR
  Filled 2016-11-20: qty 1

## 2016-11-20 MED ORDER — COLCHICINE 0.6 MG PO TABS: 0.6000 mg | ORAL_TABLET | Freq: Every day | ORAL | 0 refills | Status: DC

## 2016-11-20 MED ORDER — CLINDAMYCIN PHOSPHATE 300 MG/50ML IV SOLN
300.0000 mg | Freq: Once | INTRAVENOUS | Status: AC
  Administered 2016-11-20: 300 mg via INTRAVENOUS
  Filled 2016-11-20: qty 50

## 2016-11-20 MED ORDER — HYDROMORPHONE HCL 1 MG/ML IJ SOLN
1.0000 mg | Freq: Once | INTRAMUSCULAR | Status: AC
  Administered 2016-11-20: 1 mg via INTRAVENOUS
  Filled 2016-11-20: qty 1

## 2016-11-20 MED ORDER — ONDANSETRON HCL 4 MG/2ML IJ SOLN
4.0000 mg | Freq: Once | INTRAMUSCULAR | Status: AC
Start: 2016-11-20 — End: 2016-11-20
  Administered 2016-11-20: 4 mg via INTRAVENOUS
  Filled 2016-11-20: qty 2

## 2016-11-20 MED ORDER — CLINDAMYCIN HCL 300 MG PO CAPS: 300.0000 mg | ORAL_CAPSULE | Freq: Three times a day (TID) | ORAL | 0 refills | Status: DC

## 2016-11-20 NOTE — ED Notes (Signed)
Pt states "I must of eaten something I wasn't supposed to". Left hand and right foot swollen and red. Hx of gout. Takes Ibuprofen and Colchiceine. PCP is [employer].

## 2016-11-20 NOTE — ED Provider Notes (Signed)
[facility] DEPT Provider Note   CSN: [id number] Arrival date & time: 11/20/16  1313  By signing my name below, I, Ronnie Hernandez, attest that this documentation has been prepared under the direction and in the presence of  [place] Lan[place], [place]. Electronically Signed: Evelene Hernandez, Scribe. 11/20/2016. 2:59 PM.  History   Chief Complaint Chief Complaint  Patient presents with  . Gout  . Joint Swelling   The history is provided by the patient. No language interpreter was used.    HPI Comments:  Ronnie Hernandez is a 56 y.o. morbidly obese  male with a history of DM, gout and arthritis, who presents to the Emergency Department complaining of painful swelling to his left hand and bilateral feet x 3 days. He notes the sites are warm to touch. Pt also notes his pain today is similar to past gout flare ups. He states he has missed a few doses of his colchicine and notes that he has been eating hot dogs and bolgna, which he normally doesn't eat. Patient also reports a history of chronic bilateral lower extremity edema. Pt states he often has swelling in his BLE and took a fluid pill without improvement. Denies any trauma or injury, fever, numbness/weakness.  Past Medical History:  Diagnosis Date  . Arthritis    right fingers, both knees  . Diabetes type 2, uncontrolled ([place])   . Gout of hand   . Hyperlipidemia   . Hypertension   . Morbidly obese [facility])     Patient Active Problem List   Diagnosis Date Noted  . Pulmonary embolus ([place]) 08/05/2016  . Hyperglycemia, drug-induced 07/08/2016  . Impaired glucose tolerance 07/08/2016  . Leukocytosis 07/06/2016  . Unable to walk 07/06/2016  . Acute gouty arthropathy 07/06/2016  . Abnormality of gait 02/18/2016  . Cellulitis of left lower extremity 01/27/2016  . Type 2 diabetes mellitus ([place]) 12/25/2013  . Drug-induced gout 12/25/2013  . Hyperlipidemia 08/01/2013  . Morbidly obese ([place])   . Hypertension   . Diabetes type 2, uncontrolled  ([place])   . Gout   . Colon cancer screening 07/27/2011  . Benign neoplasm of colon 07/27/2011    Past Surgical History:  Procedure Laterality Date  . unremarkable         Home Medications    Prior to Admission medications   Medication Sig Start Date End Date Taking? Authorizing Provider  albuterol (PROVENTIL HFA;VENTOLIN HFA) 108 (90 Base) MCG/ACT inhaler Inhale 1-2 puffs into the lungs every 6 (six) hours as needed for wheezing or shortness of breath.    [provider]  albuterol (PROVENTIL) (2.5 MG/3ML) 0.083% nebulizer solution Take 3 mLs (2.5 mg total) by nebulization every 2 (two) hours as needed for wheezing. 08/08/16   Emokpae, Ejiroghene E, MD  colchicine 0.6 MG tablet TAKE 1 TABLET (0.6 MG TOTAL) BY MOUTH 2 (TWO) TIMES DAILY as needed for gouty attack/pain 07/09/16   Tat, Shanon Brow, MD  furosemide (LASIX) 20 MG tablet Take 2 tablets (40 mg total) by mouth daily. 03/01/16   [name], NP  rivaroxaban (XARELTO) 20 MG TABS tablet Take 1 tablet (20 mg total) by mouth daily with supper. 08/19/16   [name], NP    Family History Family History  Problem Relation Age of Onset  . Colon cancer Maternal Grandfather 3  . Heart attack Father   . Stomach cancer Maternal Aunt 80    Social History Social History  Substance Use Topics  . Smoking status: Former Smoker  Quit date: 02/09/1982  . Smokeless tobacco: Never Used  . Alcohol use No     Allergies   Uloric [febuxostat]   Review of Systems Review of Systems  Constitutional: Negative for fever.  Musculoskeletal: Positive for arthralgias and joint swelling.  Skin: Positive for color change.   Physical Exam Updated Vital Signs BP (!) 144/66 (BP Location: Right Arm)   Pulse 100   Temp 98.8 F (37.1 C) (Oral)   Resp 17   Ht 5\' 7"  (1.702 m)   Wt (!) 474 lb (215 kg)   SpO2 96%   BMI 74.24 kg/m   Physical Exam  Constitutional: He appears well-developed and well-nourished.  HENT:  Head:  Normocephalic and atraumatic.  Eyes: Conjunctivae and EOM are normal. Right eye exhibits no discharge. Left eye exhibits no discharge. No scleral icterus.  Cardiovascular:  Pulses:      Radial pulses are 2+ on the right side, and 2+ on the left side.  Radial pulses 2+ bilaterally  Pulmonary/Chest: Effort normal.  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 He has somewhat limited ROM of left fingers but is able to flex and extend with reports of pain.  Chronic BLE edema with lichenification   Neurological: He is alert.  Skin: Skin is warm and dry.  Mild erythema overlying the dorsal aspect of the right foot  Psychiatric: He has a normal mood and affect. His speech is normal and behavior is normal.  Nursing note and vitals reviewed.      ED Treatments / Results  DIAGNOSTIC STUDIES:  Oxygen Saturation is 96% on RA, normal by my interpretation.    COORDINATION OF CARE:  2:51 PM Discussed treatment plan with pt at bedside and pt agreed to plan.  Labs (all labs ordered are listed, but only abnormal results are displayed) Labs Reviewed - No data to display  EKG  EKG Interpretation None       Radiology No results found.  Procedures Procedures (including critical care time)  Medications Ordered in ED Medications - No data to display   Initial Impression / Assessment and Plan / ED Course  I have reviewed the triage vital signs and the nursing notes.  Pertinent labs & imaging results that were available during my care of the patient were reviewed by me and considered in my medical decision making (see chart for details).     56 year old male who presents with left hand swelling 3 days. Patient has a history of gout states that symptoms are similar, though he has a little bit more swelling at this time. He did call to seen yesterday  with no improvement. Patient is afebrile, non-toxic appearing, sitting comfortably on examination table. Concern for gout with overlying cellulitis of hand. lso concern for cellulitis of the right foot. Review of labs from March 2018 shows normal BUN/creatinine and hepatic function. Blood glucose at that time was 114. Given patient's past medical history, risk factors and physical exam, will plan to give IV antibiotics in the department for treatment of cellulitis. Will also plan to give a dose of steroids in the department.  Patient signed out to oncoming PA at shift change. Patient is in the process of obtaining an IV to start IV antibiotics. Plan is to complete course of IV and hepatic department. Will plan to send home patient with oral antibiotics" seen for treatment of cellulitis and  gout. Will have patient follow-up in the emergency department 2 days for reevaluation of hand.   Final Clinical Impressions(s) / ED Diagnoses   Final diagnoses:  Acute gout of left wrist, unspecified cause  Cellulitis of left upper extremity    New Prescriptions New Prescriptions   No medications on file   I personally performed the services described in this documentation, which was scribed in my presence. The recorded information has been reviewed and is accurate.    Volanda Napoleon, PA-C 11/21/16 1031    Malvin Johns, MD 11/21/16 [phone]

## 2016-11-20 NOTE — Discharge Instructions (Signed)
Take antibiotics as prescribed.   Take Colchicine as directed.   Return to the emergency Department in 2 days for reevaluation of wound.  Follow-up with her primary care doctor in 2-4 days as needed.  Return the emergency Department for any worsening swelling, redness that extends to her arm, fever, difficulty breathing, numbness/weakness of her arms or any other worsening or concerning symptoms.

## 2016-11-20 NOTE — ED Triage Notes (Signed)
Pt. Stated, I've had gout for 3 days especially in my left hand.

## 2016-11-22 ENCOUNTER — Other Ambulatory Visit: Payer: Self-pay | Admitting: *Deleted

## 2016-11-22 ENCOUNTER — Ambulatory Visit: Payer: Medicare HMO

## 2016-11-22 ENCOUNTER — Other Ambulatory Visit: Payer: Medicare HMO

## 2016-11-22 MED ORDER — COLCHICINE 0.6 MG PO TABS: 0.6000 mg | ORAL_TABLET | Freq: Every day | ORAL | 0 refills | Status: DC

## 2016-11-22 NOTE — Telephone Encounter (Signed)
Patient requested refill. Has appointment on 11/29/16

## 2016-11-25 ENCOUNTER — Ambulatory Visit: Payer: Medicare HMO

## 2016-11-25 ENCOUNTER — Other Ambulatory Visit: Payer: Medicare HMO

## 2016-11-29 ENCOUNTER — Ambulatory Visit (INDEPENDENT_AMBULATORY_CARE_PROVIDER_SITE_OTHER): Payer: Medicare HMO | Admitting: Nurse Practitioner

## 2016-11-29 ENCOUNTER — Encounter: Payer: Self-pay | Admitting: Nurse Practitioner

## 2016-11-29 VITALS — BP 136/82 | HR 88 | Temp 97.9°F | Ht 67.0 in | Wt >= 6400 oz

## 2016-11-29 DIAGNOSIS — E1165 Type 2 diabetes mellitus with hyperglycemia: Secondary | ICD-10-CM | POA: Diagnosis not present

## 2016-11-29 DIAGNOSIS — R600 Localized edema: Secondary | ICD-10-CM | POA: Diagnosis not present

## 2016-11-29 DIAGNOSIS — R21 Rash and other nonspecific skin eruption: Secondary | ICD-10-CM | POA: Diagnosis not present

## 2016-11-29 DIAGNOSIS — M1A9XX1 Chronic gout, unspecified, with tophus (tophi): Secondary | ICD-10-CM | POA: Diagnosis not present

## 2016-11-29 DIAGNOSIS — IMO0001 Reserved for inherently not codable concepts without codable children: Secondary | ICD-10-CM

## 2016-11-29 MED ORDER — COLCHICINE 0.6 MG PO TABS: 0.6000 mg | ORAL_TABLET | Freq: Every day | ORAL | 1 refills | Status: DC

## 2016-11-29 MED ORDER — NYSTATIN-TRIAMCINOLONE 100000-0.1 UNIT/GM-% EX OINT: 1.0000 "application " | TOPICAL_OINTMENT | Freq: Two times a day (BID) | CUTANEOUS | 0 refills | Status: DC

## 2016-11-29 NOTE — Patient Instructions (Addendum)
To follow up with Dr Eulas Post in 3 months Fasting blood work prior to appt   traid foot and ankle for foot exam and nail trim   Gold bond anti-itch lotion to skin twice daily  mycolog cream twice daily    DASH Eating Plan DASH stands for "Dietary Approaches to Stop Hypertension." The DASH eating plan is a healthy eating plan that has been shown to reduce high blood pressure (hypertension). It may also reduce your risk for type 2 diabetes, heart disease, and stroke. The DASH eating plan may also help with weight loss. What are tips for following this plan? General guidelines  Avoid eating more than 2,300 mg (milligrams) of salt (sodium) a day. If you have hypertension, you may need to reduce your sodium intake to 1,500 mg a day.  Limit alcohol intake to no more than 1 drink a day for nonpregnant women and 2 drinks a day for men. One drink equals 12 oz of beer, 5 oz of wine, or 1 oz of hard liquor.  Work with your health care provider to maintain a healthy body weight or to lose weight. Ask what an ideal weight is for you.  Get at least 30 minutes of exercise that causes your heart to beat faster (aerobic exercise) most days of the week. Activities may include walking, swimming, or biking.  Work with your health care provider or diet and nutrition specialist (dietitian) to adjust your eating plan to your individual calorie needs. Reading food labels  Check food labels for the amount of sodium per serving. Choose foods with less than 5 percent of the Daily Value of sodium. Generally, foods with less than 300 mg of sodium per serving fit into this eating plan.  To find whole grains, look for the word "whole" as the first word in the ingredient list. Shopping  Buy products labeled as "low-sodium" or "no salt added."  Buy fresh foods. Avoid canned foods and premade or frozen meals. Cooking  Avoid adding salt when cooking. Use salt-free seasonings or herbs instead of table salt or sea  salt. Check with your health care provider or pharmacist before using salt substitutes.  Do not fry foods. Cook foods using healthy methods such as baking, boiling, grilling, and broiling instead.  Cook with heart-healthy oils, such as olive, canola, soybean, or sunflower oil. Meal planning   Eat a balanced diet that includes: ? 5 or more servings of fruits and vegetables each day. At each meal, try to fill half of your plate with fruits and vegetables. ? Up to 6-8 servings of whole grains each day. ? Less than 6 oz of lean meat, poultry, or fish each day. A 3-oz serving of meat is about the same size as a deck of cards. One egg equals 1 oz. ? 2 servings of low-fat dairy each day. ? A serving of nuts, seeds, or beans 5 times each week. ? Heart-healthy fats. Healthy fats called Omega-3 fatty acids are found in foods such as flaxseeds and coldwater fish, like sardines, salmon, and mackerel.  Limit how much you eat of the following: ? Canned or prepackaged foods. ? Food that is high in trans fat, such as fried foods. ? Food that is high in saturated fat, such as fatty meat. ? Sweets, desserts, sugary drinks, and other foods with added sugar. ? Full-fat dairy products.  Do not salt foods before eating.  Try to eat at least 2 vegetarian meals each week.  Eat more home-cooked food and  less restaurant, buffet, and fast food.  When eating at a restaurant, ask that your food be prepared with less salt or no salt, if possible. What foods are recommended? The items listed may not be a complete list. Talk with your dietitian about what dietary choices are best for you. Grains Whole-grain or whole-wheat bread. Whole-grain or whole-wheat pasta. Brown rice. [place]. Bulgur. Whole-grain and low-sodium cereals. Pita bread. Low-fat, low-sodium crackers. Whole-wheat flour tortillas. Vegetables Fresh or frozen vegetables (raw, steamed, roasted, or grilled). Low-sodium or reduced-sodium tomato  and vegetable juice. Low-sodium or reduced-sodium tomato sauce and tomato paste. Low-sodium or reduced-sodium canned vegetables. Fruits All fresh, dried, or frozen fruit. Canned fruit in natural juice (without added sugar). Meat and other protein foods Skinless chicken or [place]. Ground chicken or [place]. Pork with fat trimmed off. Fish and seafood. Egg whites. Dried beans, peas, or lentils. Unsalted nuts, nut butters, and seeds. Unsalted canned beans. Lean cuts of beef with fat trimmed off. Low-sodium, lean deli meat. Dairy Low-fat (1%) or fat-free (skim) milk. Fat-free, low-fat, or reduced-fat cheeses. Nonfat, low-sodium ricotta or cottage cheese. Low-fat or nonfat yogurt. Low-fat, low-sodium cheese. Fats and oils Soft margarine without trans fats. Vegetable oil. Low-fat, reduced-fat, or light mayonnaise and salad dressings (reduced-sodium). Canola, safflower, olive, soybean, and sunflower oils. Avocado. Seasoning and other foods Herbs. Spices. Seasoning mixes without salt. Unsalted popcorn and pretzels. Fat-free sweets. What foods are not recommended? The items listed may not be a complete list. Talk with your dietitian about what dietary choices are best for you. Grains Baked goods made with fat, such as croissants, muffins, or some breads. Dry pasta or rice meal packs. Vegetables Creamed or fried vegetables. Vegetables in a cheese sauce. Regular canned vegetables (not low-sodium or reduced-sodium). Regular canned tomato sauce and paste (not low-sodium or reduced-sodium). Regular tomato and vegetable juice (not low-sodium or reduced-sodium). Angie Fava. Olives. Fruits Canned fruit in a light or heavy syrup. Fried fruit. Fruit in cream or butter sauce. Meat and other protein foods Fatty cuts of meat. Ribs. Fried meat. Berniece Salines. Sausage. Bologna and other processed lunch meats. Salami. Fatback. Hotdogs. Bratwurst. Salted nuts and seeds. Canned beans with added salt. Canned or smoked fish. Whole eggs  or egg yolks. Chicken or [place] with skin. Dairy Whole or 2% milk, cream, and half-and-half. Whole or full-fat cream cheese. Whole-fat or sweetened yogurt. Full-fat cheese. Nondairy creamers. Whipped toppings. Processed cheese and cheese spreads. Fats and oils Butter. Stick margarine. Lard. Shortening. Ghee. Bacon fat. Tropical oils, such as coconut, palm kernel, or palm oil. Seasoning and other foods Salted popcorn and pretzels. Onion salt, garlic salt, seasoned salt, table salt, and sea salt. Worcestershire sauce. Tartar sauce. Barbecue sauce. Teriyaki sauce. Soy sauce, including reduced-sodium. Steak sauce. Canned and packaged gravies. Fish sauce. Oyster sauce. Cocktail sauce. Horseradish that you find on the shelf. Ketchup. Mustard. Meat flavorings and tenderizers. Bouillon cubes. Hot sauce and Tabasco sauce. Premade or packaged marinades. Premade or packaged taco seasonings. Relishes. Regular salad dressings. Where to find more information:  National Heart, Lung, and [place]: https://wilson-eaton.com/  American Heart Association: www.heart.org Summary  The DASH eating plan is a healthy eating plan that has been shown to reduce high blood pressure (hypertension). It may also reduce your risk for type 2 diabetes, heart disease, and stroke.  With the DASH eating plan, you should limit salt (sodium) intake to 2,300 mg a day. If you have hypertension, you may need to reduce your sodium intake to 1,500 mg a day.  When on the DASH eating plan, aim to eat more fresh fruits and vegetables, whole grains, lean proteins, low-fat dairy, and heart-healthy fats.  Work with your health care provider or diet and nutrition specialist (dietitian) to adjust your eating plan to your individual calorie needs. This information is not intended to replace advice given to you by your health care provider. Make sure you discuss any questions you have with your health care provider. Document Released: 05/27/2011  Document Revised: 05/31/2016 Document Reviewed: 05/31/2016 Elsevier Interactive Patient Education  2017 [place] are compounds that affect the level of uric acid in your body. A low-purine diet is a diet that is low in purines. Eating a low-purine diet can prevent the level of uric acid in your body from getting too high and causing gout or kidney stones or both. What do I need to know about this diet?  Choose low-purine foods. Examples of low-purine foods are listed in the next section.  Drink plenty of fluids, especially water. Fluids can help remove uric acid from your body. Try to drink 8-16 cups (1.9-3.8 L) a day.  Limit foods high in fat, especially saturated fat, as fat makes it harder for the body to get rid of uric acid. Foods high in saturated fat include pizza, cheese, ice cream, whole milk, fried foods, and gravies. Choose foods that are lower in fat and lean sources of protein. Use olive oil when cooking as it contains healthy fats that are not high in saturated fat.  Limit alcohol. Alcohol interferes with the elimination of uric acid from your body. If you are having a gout attack, avoid all alcohol.  Keep in mind that different people's bodies react differently to different foods. You will probably learn over time which foods do or do not affect you. If you discover that a food tends to cause your gout to flare up, avoid eating that food. You can more freely enjoy foods that do not cause problems. If you have any questions about a food item, talk to your dietitian or health care provider. Which foods are low, moderate, and high in purines? The following is a list of foods that are low, moderate, and high in purines. You can eat any amount of the foods that are low in purines. You may be able to have small amounts of foods that are moderate in purines. Ask your health care provider how much of a food moderate in purines you can have. Avoid foods high in  purines. Grains  Foods low in purines: Enriched white bread, pasta, rice, cake, cornbread, popcorn.  Foods moderate in purines: Whole-grain breads and cereals, wheat germ, bran, oatmeal. Uncooked oatmeal. Dry wheat bran or wheat germ.  Foods high in purines: Pancakes, [place] toast, biscuits, muffins. Vegetables  Foods low in purines: All vegetables, except those that are moderate in purines.  Foods moderate in purines: Asparagus, cauliflower, spinach, mushrooms, green peas. Fruits  All fruits are low in purines. Meats and other Protein Foods  Foods low in purines: Eggs, nuts, peanut butter.  Foods moderate in purines: 80-90% lean beef, lamb, veal, pork, poultry, fish, eggs, peanut butter, nuts. Crab, lobster, oysters, and shrimp. Cooked dried beans, peas, and lentils.  Foods high in purines: Anchovies, sardines, herring, mussels, tuna, codfish, scallops, trout, and haddock. Berniece Salines. Organ meats (such as liver or kidney). [place]. Game meat. Goose. Sweetbreads. Dairy  All dairy foods are low in purines. Low-fat and fat-free dairy products are best because they are  low in saturated fat. Beverages  Drinks low in purines: Water, carbonated beverages, tea, coffee, cocoa.  Drinks moderate in purines: Soft drinks and other drinks sweetened with high-fructose corn syrup. Juices. To find whether a food or drink is sweetened with high-fructose corn syrup, look at the ingredients list.  Drinks high in purines: Alcoholic beverages (such as beer). Condiments  Foods low in purines: Salt, herbs, olives, pickles, relishes, vinegar.  Foods moderate in purines: Butter, margarine, oils, mayonnaise. Fats and Oils  Foods low in purines: All types, except gravies and sauces made with meat.  Foods high in purines: Gravies and sauces made with meat. Other Foods  Foods low in purines: Sugars, sweets, gelatin. Cake. Soups made without meat.  Foods moderate in purines: Meat-based or fish-based soups,  broths, or bouillons. Foods and drinks sweetened with high-fructose corn syrup.  Foods high in purines: High-fat desserts (such as ice cream, cookies, cakes, pies, doughnuts, and chocolate). Contact your dietitian for more information on foods that are not listed here. This information is not intended to replace advice given to you by your health care provider. Make sure you discuss any questions you have with your health care provider. Document Released: 10/02/2010 Document Revised: 11/13/2015 Document Reviewed: 05/14/2013 Elsevier Interactive Patient Education  2017 [employer].

## 2016-11-29 NOTE — Progress Notes (Signed)
Careteam: [name], NP as PCP - General (Nurse Practitioner)  Advanced Directive information    Allergies  Allergen Reactions  . Uloric [Febuxostat] Other (See Comments)    Made the skin on feet and legs "hurt"    Chief Complaint  Patient presents with  . Medical Management of Chronic Issues    3 month follow-up, DM foot exam due, discuss Hep C screening, and rash on back   . Gout    Gout flare up (left hand) x 1 week, discuss gout medication   . Health Maintenance    Refer to Dr. Thurston Hole for DM eye exam, scheduled after 3rd of month,   . Medication Refill    No refills needed      HPI: Patient is a 56 y.o. male seen in the office today to follow up ED visit.  Pt with hx of morbidly obesity, DM, gout and arthritis, who went to the Emergency Department complaining of painful swelling to his left hand and bilateral feet x 3 days. He notes the sites are warm to touch. Pt also notes his pain today is similar to past gout flare ups. He states he has missed a few doses of his colchicine and notes that he has been eating hot dogs and bolgna, which he normally doesn't eat.  He also had LE edema. Concern of gout and cellulitis. Pt was given IV clindamycin and IM dexamethasone and told to follow up in ED in 2 days however he did not follow up and is here today. Taking Clindamycin PO and has 1 dose left.  Still having a lot of swelling in the left hand. Pain is a lot better.  Tenderness has improved Needs refill on colchicine.   Using albuterol occasionally, was given during hospitalization. Review of Systems:  Review of Systems  Constitutional: Positive for weight loss (on diuretic ). Negative for chills and fever.  HENT: Negative for tinnitus.   Respiratory: Negative for cough, sputum production and shortness of breath.   Cardiovascular: Negative for chest pain, palpitations and leg swelling.  Gastrointestinal: Negative for abdominal pain,  constipation, diarrhea and heartburn.  Genitourinary: Negative for dysuria, frequency and urgency.  Musculoskeletal: Positive for joint pain and myalgias. Negative for back pain and falls.  Skin: Positive for itching and rash.  Neurological: Negative for dizziness and headaches.  Psychiatric/Behavioral: Negative for depression and memory loss. The patient does not have insomnia.     Past Medical History:  Diagnosis Date  . Arthritis    right fingers, both knees  . Diabetes type 2, uncontrolled ([place])   . Gout of hand   . Hyperlipidemia   . Hypertension   . Morbidly obese [facility])    Past Surgical History:  Procedure Laterality Date  . unremarkable     Social History:   reports that he quit smoking about 34 years ago. He has never used smokeless tobacco. He reports that he does not drink alcohol or use drugs.  Family History  Problem Relation Age of Onset  . Colon cancer Maternal Grandfather 21  . Heart attack Father   . Stomach cancer Maternal Aunt 80    Medications: Patient's Medications  New Prescriptions   No medications on file  Previous Medications   COLCHICINE 0.6 MG TABLET    Take 1 tablet (0.6 mg total) by mouth daily.   FUROSEMIDE (LASIX) 20 MG TABLET    Take 2 tablets (40 mg total) by mouth daily.  RIVAROXABAN (XARELTO) 20 MG TABS TABLET    Take 1 tablet (20 mg total) by mouth daily with supper.  Modified Medications   No medications on file  Discontinued Medications   ALBUTEROL (PROVENTIL HFA;VENTOLIN HFA) 108 (90 BASE) MCG/ACT INHALER    Inhale 1-2 puffs into the lungs every 6 (six) hours as needed for wheezing or shortness of breath.   ALBUTEROL (PROVENTIL) (2.5 MG/3ML) 0.083% NEBULIZER SOLUTION    Take 3 mLs (2.5 mg total) by nebulization every 2 (two) hours as needed for wheezing.   CLINDAMYCIN (CLEOCIN) 300 MG CAPSULE    Take 1 capsule (300 mg total) by mouth 3 (three) times daily.     Physical Exam:  Vitals:   11/29/16 1304  BP: 136/82  Pulse: 88    Temp: 97.9 F (36.6 C)  TempSrc: Oral  SpO2: 98%  Weight: (!) 458 lb 12.8 oz (208.1 kg)  Height: 5\' 7"  (1.702 m)   Body mass index is 71.86 kg/m.  Physical Exam  Constitutional: He is oriented to person, place, and time. He appears well-developed and well-nourished. No distress.  Morbidly Obese male  HENT:  Head: Normocephalic and atraumatic.  Right Ear: Tympanic membrane normal.  Left Ear: Tympanic membrane, external ear and ear canal normal.  Eyes: Conjunctivae and EOM are normal. Pupils are equal, round, and reactive to light.  Neck: Normal range of motion. Neck supple.  Cardiovascular: Normal rate, regular rhythm and normal heart sounds.   Pulmonary/Chest: Effort normal and breath sounds normal. He has no wheezes. He has no rales.  Abdominal: Soft. Bowel sounds are normal. He exhibits no distension. There is no tenderness.  Obese abdomen   Musculoskeletal: He exhibits edema (2+ bilaterally and to left hand) and deformity. He exhibits no tenderness.  Has arthritis changes to his digits in his hands, tophi in index finger Abnormal gait due to size and joint stiffness  Neurological: He is alert and oriented to person, place, and time.  Skin: Skin is warm and dry. Rash (dark patchy areas mostly to upper back and some random patches to lower back and buttocks) noted. He is not diaphoretic.  Psychiatric: He has a normal mood and affect. His behavior is normal. Judgment and thought content normal.    Labs reviewed: Basic Metabolic Panel:  Recent Labs  07/06/16 2004  07/08/16 1424  08/05/16 1711 08/06/16 0302 08/19/16 1537  NA  --   < > 131*  < > 141 137 141  K  --   < > 4.5  < > 3.8 3.5 4.2  CL  --   < > 99*  < > 102 100* 105  CO2  --   < > 25  < > 30 28 26   GLUCOSE  --   < > 333*  < > 130* 136* 114*  BUN  --   < > 45*  < > 10 8 17   CREATININE 0.99  < > 1.16  < > 1.06 0.92 1.14  CALCIUM  --   < > 8.1*  < > 8.8* 8.3* 9.1  MG 1.9  --  2.2  --   --   --   --   PHOS 3.4   --   --   --   --   --   --   < > = values in this interval not displayed. Liver Function Tests:  Recent Labs  01/27/16 0304 03/01/16 1100 06/17/16 1425  AST 26 20 16   ALT 29 29 18  ALKPHOS 86 88 102  BILITOT 0.5 0.4 0.6  PROT 6.5 6.9 7.1  ALBUMIN 2.9* 3.6 3.6    Recent Labs  03/01/16 1100  LIPASE 24  AMYLASE 34   No results for input(s): AMMONIA in the last 8760 hours. CBC:  Recent Labs  03/01/16 1100  07/06/16 1159  08/07/16 0323 08/08/16 0240 08/19/16 1537  WBC 7.4  < > 22.6*  < > 8.2 8.4 6.1  NEUTROABS 4,514  --  19.3*  --   --   --  3,477  HGB 13.5  < > 12.5*  < > 12.1* 11.3* 13.5  HCT 40.5  < > 36.7*  < > 37.9* 34.7* 40.9  MCV 92.7  < > 93.4  < > 95.9 94.6 93.6  PLT 175  < > 217  < > 159 183 373  < > = values in this interval not displayed. Lipid Panel:  Recent Labs  12/05/15 1226 03/11/16 0940 07/07/16 0359  CHOL 179 148 145  HDL 41 47 31*  LDLCALC 122* 76 97  TRIG 82 125 87  CHOLHDL 4.4 3.1 4.7   TSH: No results for input(s): TSH in the last 8760 hours. A1C: Lab Results  Component Value Date   HGBA1C 6.0 (H) 07/06/2016     Assessment/Plan 1. Gout with tophi Recent flare after forgetting medication and poor dietary choices but this has improved. Completes antibiotic today, no signs of cellulitis at this time. - colchicine 0.6 MG tablet; Take 1 tablet (0.6 mg total) by mouth daily.  Dispense: 90 tablet; Refill: 1  2. Uncontrolled type 2 diabetes mellitus without complication, without long-term current use of insulin (HCC) -A1c of 6.0, discussed diet modification.   Ambulatory referral to Ophthalmology - Ambulatory referral to Podiatry  3. Lower leg edema Improved since taking lasix, will cont current regimen  4. Rash - nystatin-triamcinolone ointment (MYCOLOG); Apply 1 application topically 2 (two) times daily.  Dispense: 30 g; Refill: 0  5. Morbidly obese ([place]) Stressed importance of lifestyle changes, reports he has completed PT  and not plans to get back into gym. Has seen nutritionist and aware of dietary changes needed.    Carlos American. Harle Battiest  [facility] [phone] 8 am - 5 pm) [phone] (after hours)

## 2016-12-10 DIAGNOSIS — J45998 Other asthma: Secondary | ICD-10-CM | POA: Diagnosis not present

## 2016-12-29 ENCOUNTER — Ambulatory Visit: Payer: Medicare HMO | Admitting: Podiatry

## 2017-01-09 DIAGNOSIS — J45998 Other asthma: Secondary | ICD-10-CM | POA: Diagnosis not present

## 2017-01-26 ENCOUNTER — Ambulatory Visit: Payer: Medicare HMO | Admitting: Podiatry

## 2017-02-09 DIAGNOSIS — J45998 Other asthma: Secondary | ICD-10-CM | POA: Diagnosis not present

## 2017-02-23 ENCOUNTER — Ambulatory Visit: Payer: Medicare HMO | Admitting: Podiatry

## 2017-03-01 ENCOUNTER — Ambulatory Visit: Payer: Medicare HMO | Admitting: Internal Medicine

## 2017-03-12 DIAGNOSIS — J45998 Other asthma: Secondary | ICD-10-CM | POA: Diagnosis not present

## 2017-03-23 ENCOUNTER — Other Ambulatory Visit: Payer: Medicare HMO

## 2017-03-23 DIAGNOSIS — E1165 Type 2 diabetes mellitus with hyperglycemia: Secondary | ICD-10-CM | POA: Diagnosis not present

## 2017-03-23 DIAGNOSIS — I2699 Other pulmonary embolism without acute cor pulmonale: Secondary | ICD-10-CM | POA: Diagnosis not present

## 2017-03-23 DIAGNOSIS — M1A9XX1 Chronic gout, unspecified, with tophus (tophi): Secondary | ICD-10-CM

## 2017-03-23 DIAGNOSIS — IMO0001 Reserved for inherently not codable concepts without codable children: Secondary | ICD-10-CM

## 2017-03-23 DIAGNOSIS — E785 Hyperlipidemia, unspecified: Secondary | ICD-10-CM | POA: Diagnosis not present

## 2017-03-23 DIAGNOSIS — E119 Type 2 diabetes mellitus without complications: Secondary | ICD-10-CM | POA: Diagnosis not present

## 2017-03-24 ENCOUNTER — Encounter: Payer: Self-pay | Admitting: *Deleted

## 2017-03-25 ENCOUNTER — Encounter: Payer: Self-pay | Admitting: Internal Medicine

## 2017-03-25 ENCOUNTER — Ambulatory Visit (INDEPENDENT_AMBULATORY_CARE_PROVIDER_SITE_OTHER): Payer: Medicare HMO | Admitting: Internal Medicine

## 2017-03-25 VITALS — BP 132/78 | HR 93 | Temp 97.6°F | Resp 18 | Ht 67.0 in | Wt >= 6400 oz

## 2017-03-25 DIAGNOSIS — R21 Rash and other nonspecific skin eruption: Secondary | ICD-10-CM

## 2017-03-25 DIAGNOSIS — I2699 Other pulmonary embolism without acute cor pulmonale: Secondary | ICD-10-CM | POA: Diagnosis not present

## 2017-03-25 DIAGNOSIS — E1169 Type 2 diabetes mellitus with other specified complication: Secondary | ICD-10-CM

## 2017-03-25 DIAGNOSIS — M10042 Idiopathic gout, left hand: Secondary | ICD-10-CM | POA: Diagnosis not present

## 2017-03-25 DIAGNOSIS — I10 Essential (primary) hypertension: Secondary | ICD-10-CM

## 2017-03-25 DIAGNOSIS — R600 Localized edema: Secondary | ICD-10-CM | POA: Diagnosis not present

## 2017-03-25 DIAGNOSIS — E669 Obesity, unspecified: Secondary | ICD-10-CM | POA: Diagnosis not present

## 2017-03-25 DIAGNOSIS — Z23 Encounter for immunization: Secondary | ICD-10-CM

## 2017-03-25 DIAGNOSIS — E785 Hyperlipidemia, unspecified: Secondary | ICD-10-CM

## 2017-03-25 DIAGNOSIS — M1A9XX1 Chronic gout, unspecified, with tophus (tophi): Secondary | ICD-10-CM

## 2017-03-25 LAB — CBC WITH DIFFERENTIAL/PLATELET
BASOS PCT: 0.4 %
Basophils Absolute: 22 cells/uL (ref 0–200)
EOS ABS: 373 {cells}/uL (ref 15–500)
Eosinophils Relative: 6.9 %
HCT: 39.5 % (ref 38.5–50.0)
Hemoglobin: 13.5 g/dL (ref 13.2–17.1)
Lymphs Abs: 1696 cells/uL (ref 850–3900)
MCH: 31.1 pg (ref 27.0–33.0)
MCHC: 34.2 g/dL (ref 32.0–36.0)
MCV: 91 fL (ref 80.0–100.0)
MONOS PCT: 6.7 %
MPV: 11.9 fL (ref 7.5–12.5)
NEUTROS PCT: 54.6 %
Neutro Abs: 2948 cells/uL (ref 1500–7800)
PLATELETS: 170 10*3/uL (ref 140–400)
RBC: 4.34 10*6/uL (ref 4.20–5.80)
RDW: 12.9 % (ref 11.0–15.0)
TOTAL LYMPHOCYTE: 31.4 %
WBC mixed population: 362 cells/uL (ref 200–950)
WBC: 5.4 10*3/uL (ref 3.8–10.8)

## 2017-03-25 LAB — COMPLETE METABOLIC PANEL WITHOUT GFR
AG Ratio: 1.2 (calc) (ref 1.0–2.5)
ALT: 15 U/L (ref 9–46)
AST: 17 U/L (ref 10–35)
Albumin: 3.5 g/dL — ABNORMAL LOW (ref 3.6–5.1)
Alkaline phosphatase (APISO): 111 U/L (ref 40–115)
BUN: 12 mg/dL (ref 7–25)
CO2: 24 mmol/L (ref 20–32)
Calcium: 8.6 mg/dL (ref 8.6–10.3)
Chloride: 109 mmol/L (ref 98–110)
Creat: 1.09 mg/dL (ref 0.70–1.33)
GFR, Est African American: 87 mL/min/1.73m2
GFR, Est Non African American: 75 mL/min/1.73m2
Globulin: 3 g/dL (ref 1.9–3.7)
Glucose, Bld: 100 mg/dL — ABNORMAL HIGH (ref 65–99)
Potassium: 4.1 mmol/L (ref 3.5–5.3)
Sodium: 141 mmol/L (ref 135–146)
Total Bilirubin: 0.3 mg/dL (ref 0.2–1.2)
Total Protein: 6.5 g/dL (ref 6.1–8.1)

## 2017-03-25 LAB — TEST AUTHORIZATION

## 2017-03-25 LAB — LIPID PANEL
CHOL/HDL RATIO: 3.2 (calc) (ref <5.0)
Cholesterol: 164 mg/dL (ref <200)
HDL: 52 mg/dL (ref >40)
LDL CHOLESTEROL (CALC): 94 mg/dL
Non-HDL Cholesterol (Calc): 112 mg/dL (calc) (ref <130)
TRIGLYCERIDES: 85 mg/dL (ref <150)

## 2017-03-25 LAB — HEMOGLOBIN A1C
HEMOGLOBIN A1C: 5.3 %{Hb} (ref <5.7)
Mean Plasma Glucose: 105 (calc)
eAG (mmol/L): 5.8 (calc)

## 2017-03-25 LAB — URIC ACID: Uric Acid, Serum: 10.4 mg/dL — ABNORMAL HIGH (ref 4.0–8.0)

## 2017-03-25 MED ORDER — PREDNISONE 10 MG PO TABS: ORAL_TABLET | ORAL | 0 refills | Status: DC

## 2017-03-25 MED ORDER — ALLOPURINOL 300 MG PO TABS: 300.0000 mg | ORAL_TABLET | Freq: Every day | ORAL | 6 refills | Status: DC

## 2017-03-25 MED ORDER — COLCHICINE 0.6 MG PO TABS: 0.6000 mg | ORAL_TABLET | Freq: Every day | ORAL | 1 refills | Status: DC

## 2017-03-25 NOTE — Progress Notes (Signed)
Patient ID: Ronnie Hernandez, male   DOB: 03-10-1961, 56 y.o.   MRN: [id number]    Location:  [facility] Place of Service: OFFICE  Chief Complaint  Patient presents with  . Medical Management of Chronic Issues    Pt is being seen for a 3 month routine visit.  Pt is concerned of a rash on buttocks and legs. Pt is also concerned with swelling left hand.     HPI:  56 yo male seen today for f/u. He is c/a generalized itchy rash not relieved with topical cortisone. He is noncompliant with medications. No f/c, CP, SOB.  Morbid obesity - he is discouraged due to losing personal trainer. Weight up 17 lbs overall since Sept 2017. He plans to get back to the gym. Albumin 3.5. BMI 74.11  HTN - diet controlled. No longer on ASA but takes xeralto 2/2 PE  LE edema - takes lasix but does not take daily  Hyperlipidemia - currently off simvastatin. Last LDL 97; HDL 31  DM - diet controlled. he does not checked BS at home consistently. No numbness or tingling but does have burning in feet at times. A1c 5.3%. Last eye exam 04/25/15. off ACEI  Gout - he has frequent gout flares. He has been off allopurinol for unknown reason. Per 06/2016 d/c summary from hospital, it was held. He does not take colchicine daily. Uric acid 10.4  Hx PE - dx in 07/2016. He was on xeralto but has not taken it in approx 1 month. No bleeding.   Past Medical History:  Diagnosis Date  . Arthritis    right fingers, both knees  . Diabetes type 2, uncontrolled ([place])   . Gout of hand   . Hyperlipidemia   . Hypertension   . Morbidly obese [facility])     Past Surgical History:  Procedure Laterality Date  . unremarkable      [name], NP as PCP - General (Nurse Practitioner)  Social History   Social History  . Marital status: Single    Spouse name: N/A  . Number of children: N/A  . Years of education: N/A   Occupational History  . Not on file.   Social History Main Topics  . Smoking status:  Former Smoker    Quit date: 02/09/1982  . Smokeless tobacco: Never Used  . Alcohol use No  . Drug use: No  . Sexual activity: No   Other Topics Concern  . Not on file   Social History Narrative  . No narrative on file     reports that he quit smoking about 35 years ago. He has never used smokeless tobacco. He reports that he does not drink alcohol or use drugs.  Family History  Problem Relation Age of Onset  . Colon cancer Maternal Grandfather 67  . Heart attack Father   . Stomach cancer Maternal Aunt 80   Family Status  Relation Status  . MGF Deceased  . Father Deceased  . Mother Alive  . Sister Alive  . Sister Alive  . Sister Alive  . Sister Alive  . Mat Aunt (Not Specified)     Allergies  Allergen Reactions  . Uloric [Febuxostat] Other (See Comments)    Made the skin on feet and legs "hurt"    Medications: Patient's Medications  New Prescriptions   No medications on file  Previous Medications   COLCHICINE 0.6 MG TABLET    Take 1 tablet (0.6 mg total) by mouth  daily.   FUROSEMIDE (LASIX) 20 MG TABLET    Take 2 tablets (40 mg total) by mouth daily.   NYSTATIN-TRIAMCINOLONE OINTMENT (MYCOLOG)    Apply 1 application topically 2 (two) times daily.   RIVAROXABAN (XARELTO) 20 MG TABS TABLET    Take 1 tablet (20 mg total) by mouth daily with supper.  Modified Medications   No medications on file  Discontinued Medications   No medications on file    Review of Systems  Musculoskeletal: Positive for arthralgias, back pain and gait problem.  Skin: Positive for rash.  All other systems reviewed and are negative.   Vitals:   03/25/17 0814  BP: 132/78  Pulse: 93  Resp: 18  Temp: 97.6 F (36.4 C)  TempSrc: Oral  SpO2: 96%  Weight: (!) 473 lb 3.2 oz (214.6 kg)  Height: 5\' 7"  (1.702 m)   Body mass index is 74.11 kg/m.  Physical Exam  Constitutional: He is oriented to person, place, and time. He appears well-developed and well-nourished.  HENT:    Mouth/Throat: Oropharynx is clear and moist.  MMM; no oral thrush  Eyes: Pupils are equal, round, and reactive to light. No scleral icterus.  Neck: Neck supple. Muscular tenderness present. Carotid bruit is not present. Decreased range of motion present. No thyromegaly present.  Cardiovascular: Normal rate, regular rhythm and intact distal pulses.  Exam reveals no gallop and no friction rub.   Murmur (1/6 SEM) heard. +2 pitting LE edema b/l. No calf TTP  Pulmonary/Chest: Effort normal and breath sounds normal. He has no wheezes. He has no rales. He exhibits no tenderness.  Abdominal: Soft. Normal appearance and bowel sounds are normal. He exhibits no distension, no abdominal bruit, no pulsatile midline mass and no mass. There is no hepatomegaly. There is no tenderness. There is no rigidity, no rebound and no guarding. No hernia.  obese  Musculoskeletal: He exhibits edema (small and large joints), tenderness and deformity (in samll joints).  Lymphadenopathy:    He has no cervical adenopathy.  Neurological: He is alert and oriented to person, place, and time.  Skin: Skin is warm and dry. Rash (eczematous generalized) noted.  Psychiatric: He has a normal mood and affect. His behavior is normal. Judgment and thought content normal.   Diabetic Foot Exam - Simple   Simple Foot Form Diabetic Foot exam was performed with the following findings:  Yes 03/25/2017  9:19 AM  Visual Inspection See comments:  Yes Sensation Testing Intact to touch and monofilament testing bilaterally:  Yes Pulse Check Posterior Tibialis and Dorsalis pulse intact bilaterally:  Yes Comments Dry, scaling plantar surface; toe deformities present; toenail hypertrophy noted       Labs reviewed: Appointment on 03/23/2017  Component Date Value Ref Range Status  . WBC 03/23/2017 5.4  3.8 - 10.8 Thousand/uL Final  . RBC 03/23/2017 4.34  4.20 - 5.80 Million/uL Final  . Hemoglobin 03/23/2017 13.5  13.2 - 17.1 g/dL Final   . HCT 03/23/2017 39.5  38.5 - 50.0 % Final  . MCV 03/23/2017 91.0  80.0 - 100.0 fL Final  . MCH 03/23/2017 31.1  27.0 - 33.0 pg Final  . MCHC 03/23/2017 34.2  32.0 - 36.0 g/dL Final  . RDW 03/23/2017 12.9  11.0 - 15.0 % Final  . Platelets 03/23/2017 170  140 - 400 Thousand/uL Final  . MPV 03/23/2017 11.9  7.5 - 12.5 fL Final  . Neutro Abs 03/23/2017 2948  1,500 - 7,800 cells/uL Final  . Lymphs Abs 03/23/2017  1696  850 - 3,900 cells/uL Final  . WBC mixed population 03/23/2017 362  200 - 950 cells/uL Final  . Eosinophils Absolute 03/23/2017 373  15 - 500 cells/uL Final  . Basophils Absolute 03/23/2017 22  0 - 200 cells/uL Final  . Neutrophils Relative % 03/23/2017 54.6  % Final  . Total Lymphocyte 03/23/2017 31.4  % Final  . Monocytes Relative 03/23/2017 6.7  % Final  . Eosinophils Relative 03/23/2017 6.9  % Final  . Basophils Relative 03/23/2017 0.4  % Final  . Glucose, Bld 03/23/2017 100* 65 - 99 mg/dL Final   Comment: .            Fasting reference interval . For someone without known diabetes, a glucose value between 100 and 125 mg/dL is consistent with prediabetes and should be confirmed with a follow-up test. .   . BUN 03/23/2017 12  7 - 25 mg/dL Final  . Creat 03/23/2017 1.09  0.70 - 1.33 mg/dL Final   Comment: For patients >65 years of age, the reference limit for Creatinine is approximately 13% higher for people identified as African-American. .   . GFR, Est Non African American 03/23/2017 75  > OR = 60 mL/min/1.22m2 Final  . GFR, Est African American 03/23/2017 87  > OR = 60 mL/min/1.47m2 Final  . BUN/Creatinine Ratio 66/59/9357 NOT APPLICABLE  6 - 22 (calc) Final  . Sodium 03/23/2017 141  135 - 146 mmol/L Final  . Potassium 03/23/2017 4.1  3.5 - 5.3 mmol/L Final  . Chloride 03/23/2017 109  98 - 110 mmol/L Final  . CO2 03/23/2017 24  20 - 32 mmol/L Final  . Calcium 03/23/2017 8.6  8.6 - 10.3 mg/dL Final  . Total Protein 03/23/2017 6.5  6.1 - 8.1 g/dL Final  .  Albumin 03/23/2017 3.5* 3.6 - 5.1 g/dL Final  . Globulin 03/23/2017 3.0  1.9 - 3.7 g/dL (calc) Final  . AG Ratio 03/23/2017 1.2  1.0 - 2.5 (calc) Final  . Total Bilirubin 03/23/2017 0.3  0.2 - 1.2 mg/dL Final  . Alkaline phosphatase (APISO) 03/23/2017 111  40 - 115 U/L Final  . AST 03/23/2017 17  10 - 35 U/L Final  . ALT 03/23/2017 15  9 - 46 U/L Final  . Hgb A1c MFr Bld 03/23/2017 5.3  <5.7 % of total Hgb Final   Comment: For the purpose of screening for the presence of diabetes: . <5.7%       Consistent with the absence of diabetes 5.7-6.4%    Consistent with increased risk for diabetes             (prediabetes) > or =6.5%  Consistent with diabetes . This assay result is consistent with a decreased risk of diabetes. . Currently, no consensus exists regarding use of hemoglobin A1c for diagnosis of diabetes in children. . According to American Diabetes Association (ADA) guidelines, hemoglobin A1c <7.0% represents optimal control in non-pregnant diabetic patients. Different metrics may apply to specific patient populations.  Standards of Medical Care in Diabetes(ADA). .   . Mean Plasma Glucose 03/23/2017 105  (calc) Final  . eAG (mmol/L) 03/23/2017 5.8  (calc) Final  . Uric Acid, Serum 03/23/2017 10.4* 4.0 - 8.0 mg/dL Final   Comment: Therapeutic target for gout patients: <6.0 mg/dL .     No results found.   Assessment/Plan   ICD-10-CM   1. Rash R21    eczematous dermatitis, generalized  2. Acute idiopathic gout of left hand M10.042 colchicine 0.6 MG  tablet    predniSONE (DELTASONE) 10 MG tablet  3. Other acute pulmonary embolism without acute cor pulmonale (HCC) I26.99    symptoms resolved  4. Bilateral edema of lower extremity R60.0   5. Morbidly obese (HCC) E66.01   6. Essential hypertension I10   7. Hyperlipidemia, unspecified hyperlipidemia type E78.5   8. Diabetes mellitus type 2 in obese (HCC) E11.69 Microalbumin/Creatinine Ratio, Urine   E66.9 Ambulatory  referral to Ophthalmology  9. Need for immunization against influenza Z23 Flu Vaccine QUAD 6+ mos PF IM (Fluarix Quad PF)    STOP XERALTO  START PREDNISONE TAPER FOR GOUT FLARE AND ECZEMA RASH  Influenza vaccine given today  Please add lipid panel to labs already drawn  Will call with additional lab results  Will call with eye referral  Follow up with Janett Billow in 3 mos for morbid obesity, hyperlipidemia, DM, HTN, LE edema. Fasting labs prior to appt  Dickson S. Perlie Gold  [facility] [address] [place], [place] [address] [phone] Cell (Monday-Friday 8 AM - 5 PM) [phone] After 5 PM and follow prompts

## 2017-03-25 NOTE — Patient Instructions (Addendum)
STOP XERALTO  START PREDNISONE TAPER FOR GOUT FLARE AND ECZEMA RASH  Influenza vaccine given today  Please add lipid panel to labs already drawn  Will call with additional lab results  Will call with eye referral  Follow up with Janett Billow in 3 mos for morbid obesity, hyperlipidemia, DM, HTN, LE edema. Fasting labs prior to appt

## 2017-03-25 NOTE — Progress Notes (Signed)
Patient ID: Ronnie Hernandez, male   DOB: [birth year], 56 y.o.   MRN: [id number]   Location:      Place of Service:    Provider: Dr. Eulas Post  [name], NP as PCP - General (Nurse Practitioner)  Extended Emergency Contact Information Primary Emergency Contact: Centner,[name] Address: [address] [place],  [address] [address] [place] [address] Phone: [phone] Mobile Phone: [phone] Relation: Niece  Code Status:  Goals of Care: Advanced Directive information Advanced Directives 03/25/2017  Does Patient Have a Medical Advance Directive? No  Would patient like information on creating a medical advance directive? -  Pre-existing out of facility DNR order (yellow form or pink MOST form) -     Chief Complaint  Patient presents with  . Medical Management of Chronic Issues    Pt is being seen for a 3 month routine visit.  Pt is concerned of a rash on buttocks and legs. Pt is also concerned with swelling left hand.     HPI: Patient is a 56 y.o. male seen in today for an annual wellness exam. Patient says the he continues have swelling and pain in left hand. He states that he has swelling and pain in left thumb that has increased in the past week and rates the pain 7/10 and is throbbing. He says that when he can take his lasix the pain isn't as bad. He also complains of a rash goes from his left and right buttocks down to right thigh. He says the cream he used last time must not have been strong enough. He is up 15 lbs since last visit in June.   Gout - Uncontrolled. Patient has not been taking alopurinol since January hospital stay.  Edema - Uncontrolled. Patient is non compliant with taking Lasix. He says it makes him urinate all the time.   Rash - Patient continues to have problems with rash bilateral buttocks that extends to thighs. Patient also has evidence of faliculitis on abdomin.     Depression screen [facility] 2/9  03/25/2017 03/15/2016 03/01/2016 02/25/2015 11/07/2014  Decreased Interest 0 0 0 0 0  Down, Depressed, Hopeless 0 0 0 0 0  PHQ - 2 Score 0 0 0 0 0    Fall Risk  03/25/2017 08/19/2016 06/29/2016 06/17/2016 03/15/2016  Falls in the past year? No No No No No   No flowsheet data found.   Health Maintenance  Topic Date Due  . FOOT EXAM  12/26/2014  . OPHTHALMOLOGY EXAM  01/03/2015  . URINE MICROALBUMIN  12/04/2016  . INFLUENZA VACCINE  01/19/2017  . Hepatitis C Screening  04/20/2018 (Originally 1960-08-17)  . HEMOGLOBIN A1C  09/21/2017  . PNEUMOCOCCAL POLYSACCHARIDE VACCINE (2) 03/15/2021  . COLONOSCOPY  07/26/2021  . TETANUS/TDAP  01/26/2026  . HIV Screening  Completed    Urinary incontinence? Functional Status Survey:   Exercise? Diet? No exam data present Hearing:   Dentition: Pain:  Past Medical History:  Diagnosis Date  . Arthritis    right fingers, both knees  . Diabetes type 2, uncontrolled ([place])   . Gout of hand   . Hyperlipidemia   . Hypertension   . Morbidly obese [facility])     Past Surgical History:  Procedure Laterality Date  . unremarkable      Family History  Problem Relation Age of Onset  . Colon cancer Maternal Grandfather 15  . Heart attack Father   .  Stomach cancer Maternal Aunt 80    Social History   Social History  . Marital status: Single    Spouse name: N/A  . Number of children: N/A  . Years of education: N/A   Social History Main Topics  . Smoking status: Former Smoker    Quit date: 02/09/1982  . Smokeless tobacco: Never Used  . Alcohol use No  . Drug use: No  . Sexual activity: No   Other Topics Concern  . None   Social History Narrative  . None    reports that he quit smoking about 35 years ago. He has never used smokeless tobacco. He reports that he does not drink alcohol or use drugs.   Allergies  Allergen Reactions  . Uloric [Febuxostat] Other (See Comments)    Made the skin on feet and legs "hurt"    Outpatient  Encounter Prescriptions as of 03/25/2017  Medication Sig  . colchicine 0.6 MG tablet Take 1 tablet (0.6 mg total) by mouth daily.  . furosemide (LASIX) 20 MG tablet Take 2 tablets (40 mg total) by mouth daily.  Marland Kitchen nystatin-triamcinolone ointment (MYCOLOG) Apply 1 application topically 2 (two) times daily.  . rivaroxaban (XARELTO) 20 MG TABS tablet Take 1 tablet (20 mg total) by mouth daily with supper.   No facility-administered encounter medications on file as of 03/25/2017.      Review of Systems:  Review of Systems  Constitutional: Negative for activity change, appetite change, chills, diaphoresis, fatigue, fever and unexpected weight change.  HENT: Negative for congestion, dental problem, drooling, ear discharge, ear pain, facial swelling, hearing loss, mouth sores, nosebleeds, postnasal drip, rhinorrhea, sinus pain, sinus pressure, sneezing, sore throat, tinnitus, trouble swallowing and voice change.   Eyes: Negative for photophobia, pain, discharge, redness, itching and visual disturbance.  Respiratory: Negative for apnea, cough, chest tightness, shortness of breath, wheezing and stridor.   Cardiovascular: Positive for leg swelling. Negative for chest pain and palpitations.  Gastrointestinal: Negative for abdominal distention, abdominal pain, anal bleeding, blood in stool, constipation, diarrhea, nausea, rectal pain and vomiting.  Genitourinary: Negative for difficulty urinating, discharge, dysuria, enuresis, flank pain, frequency, genital sores, hematuria, penile pain, testicular pain and urgency.  Musculoskeletal: Negative for arthralgias, back pain, gait problem, joint swelling, myalgias, neck pain and neck stiffness.  Skin: Positive for rash (left and right buttocks that extends to thighs; rash around lower abdomen.). Negative for color change.  Allergic/Immunologic: Negative for environmental allergies, food allergies and immunocompromised state.  Psychiatric/Behavioral: Negative for  agitation, behavioral problems, confusion, decreased concentration, sleep disturbance and suicidal ideas.    Physical Exam: Vitals:   03/25/17 0814  BP: 132/78  Pulse: 93  Resp: 18  Temp: 97.6 F (36.4 C)  TempSrc: Oral  SpO2: 96%  Weight: (!) 473 lb 3.2 oz (214.6 kg)  Height: 5\' 7"  (1.702 m)   Body mass index is 74.11 kg/m. Physical Exam  Constitutional: He is oriented to person, place, and time. He appears well-developed and well-nourished. No distress.  Eyes: Pupils are equal, round, and reactive to light. Conjunctivae and EOM are normal. Right eye exhibits no discharge. Left eye exhibits no discharge. No scleral icterus.  Neck: Normal range of motion. Neck supple. No JVD present. No tracheal deviation present. No thyromegaly present.  Cardiovascular: Normal rate, regular rhythm, normal heart sounds and intact distal pulses.   Pulmonary/Chest: Effort normal and breath sounds normal. No stridor. No respiratory distress. He has no wheezes. He has no rales. He exhibits no tenderness.  Abdominal: Soft. Bowel sounds are normal. He exhibits no distension and no mass. There is no tenderness. There is no rebound and no guarding.  Musculoskeletal: He exhibits edema (+3 edema lower extremeties; edema in left arm that extends to hand.).       Left hand: He exhibits bony tenderness, deformity and swelling.       Hands: Lymphadenopathy:    He has no cervical adenopathy.  Neurological: He is alert and oriented to person, place, and time. Coordination normal.  Skin: Skin is warm and dry. Rash (eczema bilateral buttocks to thighs. Eczema lower abdomen.) noted. He is not diaphoretic. No erythema.     Psychiatric: He has a normal mood and affect. His behavior is normal. Judgment and thought content normal.    Labs reviewed: Basic Metabolic Panel:  Recent Labs  07/06/16 2004  07/08/16 1424  08/06/16 0302 08/19/16 1537 03/23/17 1012  NA  --   < > 131*  < > 137 141 141  K  --   < > 4.5   < > 3.5 4.2 4.1  CL  --   < > 99*  < > 100* 105 109  CO2  --   < > 25  < > 28 26 24   GLUCOSE  --   < > 333*  < > 136* 114* 100*  BUN  --   < > 45*  < > 8 17 12   CREATININE 0.99  < > 1.16  < > 0.92 1.14 1.09  CALCIUM  --   < > 8.1*  < > 8.3* 9.1 8.6  MG 1.9  --  2.2  --   --   --   --   PHOS 3.4  --   --   --   --   --   --   < > = values in this interval not displayed. Liver Function Tests:  Recent Labs  06/17/16 1425 03/23/17 1012  AST 16 17  ALT 18 15  ALKPHOS 102  --   BILITOT 0.6 0.3  PROT 7.1 6.5  ALBUMIN 3.6  --    No results for input(s): LIPASE, AMYLASE in the last 8760 hours. No results for input(s): AMMONIA in the last 8760 hours. CBC:  Recent Labs  07/06/16 1159  08/08/16 0240 08/19/16 1537 03/23/17 1012  WBC 22.6*  < > 8.4 6.1 5.4  NEUTROABS 19.3*  --   --  3,477 2,948  HGB 12.5*  < > 11.3* 13.5 13.5  HCT 36.7*  < > 34.7* 40.9 39.5  MCV 93.4  < > 94.6 93.6 91.0  PLT 217  < > 183 373 170  < > = values in this interval not displayed. Lipid Panel:  Recent Labs  07/07/16 0359  CHOL 145  HDL 31*  LDLCALC 97  TRIG 87  CHOLHDL 4.7   Lab Results  Component Value Date   HGBA1C 5.3 03/23/2017    Procedures: No results found.  Assessment/Plan 1. Gout with tophi  - colchicine 0.6 MG tablet; Take 1 tablet (0.6 mg total) by mouth daily.  Dispense: 90 tablet; Refill: 1  2. Other acute pulmonary embolism without acute cor pulmonale (HCC)  - rivaroxaban (XARELTO) 20 MG TABS tablet; Take 1 tablet (20 mg total) by mouth daily with supper.  Dispense: 30 tablet; Refill: 3  Stop taking Xarelto.  Start Prednisone taper for gout flare and eczema rash.  Influenza vaccination today.  Follow up with Janett Billow in 3 months for morbid obesity,  Hyperlipidemia, DM, HTN, LE edema.  Fasting labs prior to visit.    Labs/tests ordered: Next appt:     Zi Newbury C. Hadessah Grennan, Student AGACNP

## 2017-03-26 LAB — MICROALBUMIN / CREATININE URINE RATIO
Creatinine, Urine: 158 mg/dL (ref 20–320)
MICROALB UR: 0.5 mg/dL
MICROALB/CREAT RATIO: 3 ug/mg{creat} (ref <30)

## 2017-03-28 ENCOUNTER — Telehealth: Payer: Self-pay

## 2017-03-28 MED ORDER — LISINOPRIL 2.5 MG PO TABS
2.5000 mg | ORAL_TABLET | Freq: Every day | ORAL | 6 refills | Status: DC
Start: 2017-03-28 — End: 2018-02-17

## 2017-03-28 NOTE — Telephone Encounter (Signed)
-----   Message from [place], [place] sent at 03/26/2017  4:28 PM EDT ----- No diabetic kidney disease - recommend low dose ACEI to protect kidneys from diabetes -start lisinopril 2.5mg  #30 take 1 tab po daily for kidney protection with 6RF

## 2017-03-28 NOTE — Telephone Encounter (Signed)
Medication list updated to reflect recommendations based on labs.

## 2017-04-11 DIAGNOSIS — J45998 Other asthma: Secondary | ICD-10-CM | POA: Diagnosis not present

## 2017-04-18 ENCOUNTER — Inpatient Hospital Stay (HOSPITAL_COMMUNITY)
Admission: EM | Admit: 2017-04-18 | Discharge: 2017-04-28 | DRG: 554 | Disposition: A | Discharge status: Home Health | Payer: Medicare HMO | Attending: Family Medicine | Admitting: Family Medicine

## 2017-04-18 ENCOUNTER — Emergency Department (HOSPITAL_BASED_OUTPATIENT_CLINIC_OR_DEPARTMENT_OTHER): Admit: 2017-04-18 | Discharge: 2017-04-18 | Disposition: A | Discharge status: Home / Self Care | Payer: Medicare HMO

## 2017-04-18 ENCOUNTER — Encounter (HOSPITAL_COMMUNITY): Payer: Self-pay

## 2017-04-18 DIAGNOSIS — E1165 Type 2 diabetes mellitus with hyperglycemia: Secondary | ICD-10-CM | POA: Diagnosis not present

## 2017-04-18 DIAGNOSIS — T380X5A Adverse effect of glucocorticoids and synthetic analogues, initial encounter: Secondary | ICD-10-CM | POA: Diagnosis present

## 2017-04-18 DIAGNOSIS — M1000 Idiopathic gout, unspecified site: Secondary | ICD-10-CM | POA: Diagnosis not present

## 2017-04-18 DIAGNOSIS — E119 Type 2 diabetes mellitus without complications: Secondary | ICD-10-CM | POA: Diagnosis not present

## 2017-04-18 DIAGNOSIS — I10 Essential (primary) hypertension: Secondary | ICD-10-CM | POA: Diagnosis not present

## 2017-04-18 DIAGNOSIS — Z87891 Personal history of nicotine dependence: Secondary | ICD-10-CM

## 2017-04-18 DIAGNOSIS — M10072 Idiopathic gout, left ankle and foot: Principal | ICD-10-CM | POA: Diagnosis present

## 2017-04-18 DIAGNOSIS — Z8249 Family history of ischemic heart disease and other diseases of the circulatory system: Secondary | ICD-10-CM

## 2017-04-18 DIAGNOSIS — M79661 Pain in right lower leg: Secondary | ICD-10-CM | POA: Diagnosis not present

## 2017-04-18 DIAGNOSIS — M7989 Other specified soft tissue disorders: Secondary | ICD-10-CM

## 2017-04-18 DIAGNOSIS — M109 Gout, unspecified: Secondary | ICD-10-CM | POA: Diagnosis not present

## 2017-04-18 DIAGNOSIS — R262 Difficulty in walking, not elsewhere classified: Secondary | ICD-10-CM | POA: Diagnosis not present

## 2017-04-18 DIAGNOSIS — Z86711 Personal history of pulmonary embolism: Secondary | ICD-10-CM | POA: Diagnosis not present

## 2017-04-18 DIAGNOSIS — Z7401 Bed confinement status: Secondary | ICD-10-CM

## 2017-04-18 DIAGNOSIS — M10071 Idiopathic gout, right ankle and foot: Secondary | ICD-10-CM | POA: Diagnosis present

## 2017-04-18 DIAGNOSIS — E1365 Other specified diabetes mellitus with hyperglycemia: Secondary | ICD-10-CM | POA: Diagnosis not present

## 2017-04-18 DIAGNOSIS — D72829 Elevated white blood cell count, unspecified: Secondary | ICD-10-CM | POA: Diagnosis present

## 2017-04-18 DIAGNOSIS — M25561 Pain in right knee: Secondary | ICD-10-CM | POA: Diagnosis not present

## 2017-04-18 DIAGNOSIS — M25569 Pain in unspecified knee: Secondary | ICD-10-CM

## 2017-04-18 DIAGNOSIS — E785 Hyperlipidemia, unspecified: Secondary | ICD-10-CM | POA: Diagnosis present

## 2017-04-18 DIAGNOSIS — Z6841 Body Mass Index (BMI) 40.0 and over, adult: Secondary | ICD-10-CM

## 2017-04-18 HISTORY — DX: Other pulmonary embolism without acute cor pulmonale: I26.99

## 2017-04-18 HISTORY — DX: Gout, unspecified: M10.9

## 2017-04-18 HISTORY — DX: Unspecified asthma, uncomplicated: J45.909

## 2017-04-18 LAB — BASIC METABOLIC PANEL
Anion gap: 10 (ref 5–15)
BUN: 15 mg/dL (ref 6–20)
CHLORIDE: 99 mmol/L — AB (ref 101–111)
CO2: 25 mmol/L (ref 22–32)
Calcium: 8.4 mg/dL — ABNORMAL LOW (ref 8.9–10.3)
Creatinine, Ser: 1.13 mg/dL (ref 0.61–1.24)
Glucose, Bld: 121 mg/dL — ABNORMAL HIGH (ref 65–99)
Potassium: 3.9 mmol/L (ref 3.5–5.1)
Sodium: 134 mmol/L — ABNORMAL LOW (ref 135–145)

## 2017-04-18 LAB — URINALYSIS, ROUTINE W REFLEX MICROSCOPIC
Bilirubin Urine: NEGATIVE
Glucose, UA: NEGATIVE mg/dL
Hgb urine dipstick: NEGATIVE
KETONES UR: NEGATIVE mg/dL
LEUKOCYTES UA: NEGATIVE
NITRITE: NEGATIVE
PROTEIN: NEGATIVE mg/dL
Specific Gravity, Urine: 1.004 — ABNORMAL LOW (ref 1.005–1.030)
pH: 6 (ref 5.0–8.0)

## 2017-04-18 LAB — CBC WITH DIFFERENTIAL/PLATELET
BASOS ABS: 0 10*3/uL (ref 0.0–0.1)
BASOS PCT: 0 %
Eosinophils Absolute: 0.1 10*3/uL (ref 0.0–0.7)
Eosinophils Relative: 1 %
HEMATOCRIT: 39.4 % (ref 39.0–52.0)
HEMOGLOBIN: 13.3 g/dL (ref 13.0–17.0)
Lymphocytes Relative: 15 %
Lymphs Abs: 2.1 10*3/uL (ref 0.7–4.0)
MCH: 31.7 pg (ref 26.0–34.0)
MCHC: 33.8 g/dL (ref 30.0–36.0)
MCV: 93.8 fL (ref 78.0–100.0)
MONO ABS: 1.4 10*3/uL — AB (ref 0.1–1.0)
Monocytes Relative: 10 %
NEUTROS ABS: 10.3 10*3/uL — AB (ref 1.7–7.7)
NEUTROS PCT: 74 %
Platelets: 146 10*3/uL — ABNORMAL LOW (ref 150–400)
RBC: 4.2 MIL/uL — ABNORMAL LOW (ref 4.22–5.81)
RDW: 14.2 % (ref 11.5–15.5)
WBC: 14 10*3/uL — ABNORMAL HIGH (ref 4.0–10.5)

## 2017-04-18 LAB — HEMOGLOBIN A1C
HEMOGLOBIN A1C: 6 % — AB (ref 4.8–5.6)
Mean Plasma Glucose: 125.5 mg/dL

## 2017-04-18 LAB — CBG MONITORING, ED
GLUCOSE-CAPILLARY: 135 mg/dL — AB (ref 65–99)
GLUCOSE-CAPILLARY: 199 mg/dL — AB (ref 65–99)

## 2017-04-18 LAB — URIC ACID: URIC ACID, SERUM: 6.4 mg/dL (ref 4.4–7.6)

## 2017-04-18 MED ORDER — PANTOPRAZOLE SODIUM 40 MG PO TBEC
40.0000 mg | DELAYED_RELEASE_TABLET | Freq: Every day | ORAL | Status: DC
  Administered 2017-04-19 – 2017-04-28 (×10): 40 mg via ORAL
  Filled 2017-04-18 (×10): qty 1

## 2017-04-18 MED ORDER — HYDROMORPHONE HCL 1 MG/ML IJ SOLN
1.0000 mg | Freq: Once | INTRAMUSCULAR | Status: AC
  Administered 2017-04-18: 1 mg via INTRAVENOUS
  Filled 2017-04-18: qty 1

## 2017-04-18 MED ORDER — IBUPROFEN 400 MG PO TABS: 800.0000 mg | ORAL_TABLET | Freq: Four times a day (QID) | ORAL | Status: DC | PRN

## 2017-04-18 MED ORDER — METHYLPREDNISOLONE SODIUM SUCC 125 MG IJ SOLR
80.0000 mg | Freq: Two times a day (BID) | INTRAMUSCULAR | Status: DC
  Administered 2017-04-19 – 2017-04-20 (×4): 80 mg via INTRAVENOUS
  Filled 2017-04-18 (×4): qty 2

## 2017-04-18 MED ORDER — ALLOPURINOL 300 MG PO TABS
300.0000 mg | ORAL_TABLET | Freq: Every day | ORAL | Status: DC
  Administered 2017-04-19 – 2017-04-28 (×10): 300 mg via ORAL
  Filled 2017-04-18: qty 1
  Filled 2017-04-18: qty 3
  Filled 2017-04-18 (×8): qty 1

## 2017-04-18 MED ORDER — METHYLPREDNISOLONE SODIUM SUCC 125 MG IJ SOLR
125.0000 mg | Freq: Once | INTRAMUSCULAR | Status: AC
  Administered 2017-04-18: 125 mg via INTRAVENOUS
  Filled 2017-04-18: qty 2

## 2017-04-18 MED ORDER — OXYCODONE-ACETAMINOPHEN 5-325 MG PO TABS
1.0000 | ORAL_TABLET | Freq: Once | ORAL | Status: AC
  Administered 2017-04-18: 1 via ORAL
  Filled 2017-04-18: qty 1

## 2017-04-18 MED ORDER — COLCHICINE 0.6 MG PO TABS
0.6000 mg | ORAL_TABLET | Freq: Two times a day (BID) | ORAL | Status: DC
  Administered 2017-04-18 – 2017-04-28 (×20): 0.6 mg via ORAL
  Filled 2017-04-18 (×21): qty 1

## 2017-04-18 MED ORDER — HYDROMORPHONE HCL 1 MG/ML IJ SOLN
1.0000 mg | INTRAMUSCULAR | Status: DC | PRN
  Administered 2017-04-19 (×2): 1 mg via INTRAVENOUS
  Filled 2017-04-18 (×2): qty 1

## 2017-04-18 MED ORDER — ONDANSETRON HCL 4 MG/2ML IJ SOLN
4.0000 mg | Freq: Four times a day (QID) | INTRAMUSCULAR | Status: DC | PRN
  Administered 2017-04-19: 4 mg via INTRAVENOUS
  Filled 2017-04-18: qty 2

## 2017-04-18 MED ORDER — LISINOPRIL 5 MG PO TABS
2.5000 mg | ORAL_TABLET | Freq: Every day | ORAL | Status: DC
  Administered 2017-04-19 – 2017-04-28 (×10): 2.5 mg via ORAL
  Filled 2017-04-18 (×10): qty 1

## 2017-04-18 MED ORDER — ONDANSETRON HCL 4 MG PO TABS: 4.0000 mg | ORAL_TABLET | Freq: Four times a day (QID) | ORAL | Status: DC | PRN

## 2017-04-18 MED ORDER — POLYETHYLENE GLYCOL 3350 17 G PO PACK
17.0000 g | PACK | Freq: Every day | ORAL | Status: DC
  Administered 2017-04-19 – 2017-04-22 (×2): 17 g via ORAL
  Filled 2017-04-18 (×8): qty 1

## 2017-04-18 MED ORDER — HYDROMORPHONE HCL 2 MG PO TABS
1.0000 mg | ORAL_TABLET | Freq: Once | ORAL | Status: AC
  Administered 2017-04-18: 1 mg via ORAL
  Filled 2017-04-18: qty 1

## 2017-04-18 MED ORDER — ENOXAPARIN SODIUM 100 MG/ML ~~LOC~~ SOLN
100.0000 mg | Freq: Every day | SUBCUTANEOUS | Status: DC
  Administered 2017-04-19 – 2017-04-28 (×10): 100 mg via SUBCUTANEOUS
  Filled 2017-04-18 (×10): qty 1

## 2017-04-18 MED ORDER — INSULIN ASPART 100 UNIT/ML ~~LOC~~ SOLN
0.0000 [IU] | Freq: Three times a day (TID) | SUBCUTANEOUS | Status: DC
  Administered 2017-04-19: 9 [IU] via SUBCUTANEOUS
  Administered 2017-04-19: 7 [IU] via SUBCUTANEOUS
  Administered 2017-04-19: 3 [IU] via SUBCUTANEOUS
  Administered 2017-04-20 (×2): 7 [IU] via SUBCUTANEOUS
  Administered 2017-04-20: 4 [IU] via SUBCUTANEOUS
  Administered 2017-04-21: 2 [IU] via SUBCUTANEOUS
  Administered 2017-04-21: 3 [IU] via SUBCUTANEOUS
  Administered 2017-04-21: 5 [IU] via SUBCUTANEOUS
  Administered 2017-04-22 (×3): 3 [IU] via SUBCUTANEOUS
  Administered 2017-04-23: 2 [IU] via SUBCUTANEOUS
  Administered 2017-04-23 (×2): 3 [IU] via SUBCUTANEOUS
  Administered 2017-04-24 (×2): 1 [IU] via SUBCUTANEOUS
  Administered 2017-04-24: 2 [IU] via SUBCUTANEOUS
  Administered 2017-04-25 (×3): 1 [IU] via SUBCUTANEOUS
  Administered 2017-04-26: 2 [IU] via SUBCUTANEOUS
  Administered 2017-04-26: 1 [IU] via SUBCUTANEOUS
  Filled 2017-04-18 (×2): qty 1

## 2017-04-18 MED ORDER — INSULIN ASPART 100 UNIT/ML ~~LOC~~ SOLN
0.0000 [IU] | Freq: Every day | SUBCUTANEOUS | Status: DC
  Administered 2017-04-19: 5 [IU] via SUBCUTANEOUS
  Administered 2017-04-20: 3 [IU] via SUBCUTANEOUS
  Administered 2017-04-21 – 2017-04-22 (×2): 2 [IU] via SUBCUTANEOUS

## 2017-04-18 NOTE — Care Management (Signed)
CM consult reviewed while patient is in the ED. Patient is being admitted. Care Management will follow for discharge needs. Venita Sheffield RN CCM

## 2017-04-18 NOTE — ED Notes (Signed)
Pt unable to ambulate.  W/ assistance Pt still states his feet hurt to much to bare weight.

## 2017-04-18 NOTE — ED Triage Notes (Signed)
EMS- pt coming from home complains of feet swollen. Swelling in the feet. EMS reports hands and feet are tight. Pt has hx of gout and has been taking meds as prescribed without improvement. Hr 92, RR 20, 96% on room air. BP unable to be obtained due to swelling in his extremities.

## 2017-04-18 NOTE — ED Provider Notes (Signed)
[facility] EMERGENCY DEPARTMENT Provider Note   CSN: [id number] Arrival date & time: 04/18/17  0957     History   Chief Complaint Chief Complaint  Patient presents with  . Joint Swelling    HPI Ronnie Hernandez is a 56 y.o. male.  HPI Ronnie Hernandez is a 56 y.o. male presents to ED with complaint of bilateral leg and right hand pain and swelling. States symptoms started 2 days ago. States symptoms similar to prior gout. Pain in both great toes, bottom of feet, pain radiating into bilateral shins. Also reports pain to right hand. recent treatment for gout, just finished prednisone course and currently takes colchicine and ibuprofen.   Past Medical History:  Diagnosis Date  . Arthritis    right fingers, both knees  . Diabetes type 2, uncontrolled ([place])   . Gout of hand   . Hyperlipidemia   . Hypertension   . Morbidly obese [facility])     Patient Active Problem List   Diagnosis Date Noted  . Pulmonary embolus ([place]) 08/05/2016  . Hyperglycemia, drug-induced 07/08/2016  . Impaired glucose tolerance 07/08/2016  . Leukocytosis 07/06/2016  . Unable to walk 07/06/2016  . Acute gouty arthropathy 07/06/2016  . Abnormality of gait 02/18/2016  . Cellulitis of left lower extremity 01/27/2016  . Type 2 diabetes mellitus ([place]) 12/25/2013  . Drug-induced gout 12/25/2013  . Hyperlipidemia 08/01/2013  . Morbidly obese ([place])   . Hypertension   . Diabetes type 2, uncontrolled ([place])   . Gout   . Colon cancer screening 07/27/2011  . Benign neoplasm of colon 07/27/2011    Past Surgical History:  Procedure Laterality Date  . unremarkable         Home Medications    Prior to Admission medications   Medication Sig Start Date End Date Taking? Authorizing Provider  allopurinol (ZYLOPRIM) 300 MG tablet Take 1 tablet (300 mg total) by mouth daily. 03/25/17   Gildardo Cranker, DO  colchicine 0.6 MG tablet Take 1 tablet (0.6 mg total) by mouth daily. 03/25/17    Gildardo Cranker, DO  furosemide (LASIX) 20 MG tablet Take 2 tablets (40 mg total) by mouth daily. 03/01/16   [name], NP  lisinopril (PRINIVIL,ZESTRIL) 2.5 MG tablet Take 1 tablet (2.5 mg total) by mouth daily. 03/28/17   Gildardo Cranker, DO  nystatin-triamcinolone ointment [facility]) Apply 1 application topically 2 (two) times daily. 11/29/16   [name], NP  predniSONE (DELTASONE) 10 MG tablet Take 4 tabs po daily x 3 days then 3 tabs daily x 3 days then 2 tabs daily x 3 days then 1 tab daily x 3 days and stop for acute gout flare 03/25/17   Gildardo Cranker, DO    Family History Family History  Problem Relation Age of Onset  . Colon cancer Maternal Grandfather 44  . Heart attack Father   . Stomach cancer Maternal Aunt 80    Social History Social History  Substance Use Topics  . Smoking status: Former Smoker    Quit date: 02/09/1982  . Smokeless tobacco: Never Used  . Alcohol use No     Allergies   Uloric [febuxostat]   Review of Systems Review of Systems  Constitutional: Negative for chills and fever.  Respiratory: Negative for cough, chest tightness and shortness of breath.   Cardiovascular: Negative for chest pain, palpitations and leg swelling.  Musculoskeletal: Positive for arthralgias, gait problem and myalgias. Negative for neck pain and neck stiffness.  Skin: Negative  for rash.  Allergic/Immunologic: Negative for immunocompromised state.  Neurological: Negative for dizziness, weakness, light-headedness, numbness and headaches.  All other systems reviewed and are negative.    Physical Exam Updated Vital Signs BP (!) 117/58   Temp 99.8 F (37.7 C) (Oral)   Ht 5\' 7"  (1.702 m)   Wt (!) 215.5 kg (475 lb)   BMI 74.40 kg/m    Physical Exam  Constitutional: He appears well-developed and well-nourished. No distress.  Eyes: Conjunctivae are normal.  Neck: Neck supple.  Cardiovascular: Normal rate.   Pulmonary/Chest: No respiratory distress.    Abdominal: He exhibits no distension.  Musculoskeletal:  Bilateral LE swelling. TTp to bilateral 1st MTP joints and diffusely to soles of the feet, ankles, shins, knees. Swan  Neck deformity to fingers of bilateral hands. Tophi noted to DIP joints of fingers. Diffuse tenderness.   Skin: Skin is warm and dry.  Nursing note and vitals reviewed.    ED Treatments / Results  Labs (all labs ordered are listed, but only abnormal results are displayed) Labs Reviewed  URINALYSIS, ROUTINE W REFLEX MICROSCOPIC - Abnormal; Notable for the following:       Result Value   Specific Gravity, Urine 1.004 (*)    All other components within normal limits  CBC WITH DIFFERENTIAL/PLATELET - Abnormal; Notable for the following:    WBC 14.0 (*)    RBC 4.20 (*)    Platelets 146 (*)    Neutro Abs 10.3 (*)    Monocytes Absolute 1.4 (*)    All other components within normal limits  BASIC METABOLIC PANEL - Abnormal; Notable for the following:    Sodium 134 (*)    Chloride 99 (*)    Glucose, Bld 121 (*)    Calcium 8.4 (*)    All other components within normal limits  HEMOGLOBIN A1C - Abnormal; Notable for the following:    Hgb A1c MFr Bld 6.0 (*)    All other components within normal limits  CBC - Abnormal; Notable for the following:    WBC 13.8 (*)    All other components within normal limits  CBG MONITORING, ED - Abnormal; Notable for the following:    Glucose-Capillary 135 (*)    All other components within normal limits  CBG MONITORING, ED - Abnormal; Notable for the following:    Glucose-Capillary 199 (*)    All other components within normal limits  CBG MONITORING, ED - Abnormal; Notable for the following:    Glucose-Capillary 246 (*)    All other components within normal limits  CBG MONITORING, ED - Abnormal; Notable for the following:    Glucose-Capillary 314 (*)    All other components within normal limits  URIC ACID    EKG  EKG Interpretation None       Radiology Dg Knee 1-2  Views Right  Result Date: 04/19/2017 CLINICAL DATA:  Pain EXAM: RIGHT KNEE - 1-2 VIEW COMPARISON:  None. FINDINGS: Frontal and lateral views obtained. No fracture or dislocation is seen. There is lateral patellar subluxation. The suprapatellar bursa is not well seen to assess for joint effusion. There is marked narrowing medially and in the patellofemoral joint regions. There is spurring in all compartments. No erosive change. IMPRESSION: Marked osteoarthritic change with narrowing most severe medially and in the patellofemoral joint regions. Spurring in all compartments. Unable to optimally assess for a fusion due to lack of visualization of suprapatellar bursa. By report, patient was unable to cooperate to allow for imaging more superiorly.  There is evidence of patellar subluxation laterally without frank fracture or dislocation. Electronically Signed   By: Lowella Grip III M.D.   On: 04/19/2017 15:02    Procedures Procedures (including critical care time)  Medications Ordered in ED Medications  ibuprofen (ADVIL,MOTRIN) tablet 800 mg (not administered)  lisinopril (PRINIVIL,ZESTRIL) tablet 2.5 mg (2.5 mg Oral Given 04/19/17 0954)  allopurinol (ZYLOPRIM) tablet 300 mg (300 mg Oral Given 04/19/17 0954)  colchicine tablet 0.6 mg (0.6 mg Oral Given 04/19/17 0954)  methylPREDNISolone sodium succinate (SOLU-MEDROL) 125 mg/2 mL injection 80 mg (80 mg Intravenous Not Given 04/19/17 0710)  HYDROmorphone (DILAUDID) injection 1 mg (1 mg Intravenous Given 04/19/17 0816)  enoxaparin (LOVENOX) injection 100 mg (100 mg Subcutaneous Given 04/19/17 0954)  polyethylene glycol (MIRALAX / GLYCOLAX) packet 17 g (17 g Oral Given 04/19/17 0954)  ondansetron (ZOFRAN) tablet 4 mg ( Oral See Alternative 04/19/17 0826)    Or  ondansetron (ZOFRAN) injection 4 mg (4 mg Intravenous Given 04/19/17 0826)  pantoprazole (PROTONIX) EC tablet 40 mg (40 mg Oral Given 04/19/17 0954)  insulin aspart (novoLOG) injection 0-9  Units (7 Units Subcutaneous Given 04/19/17 1242)  insulin aspart (novoLOG) injection 0-5 Units (0 Units Subcutaneous Not Given 04/18/17 2209)  insulin glargine (LANTUS) injection 15 Units (not administered)  oxyCODONE-acetaminophen (PERCOCET/ROXICET) 5-325 MG per tablet 1 tablet (1 tablet Oral Given 04/18/17 1125)  HYDROmorphone (DILAUDID) injection 1 mg (1 mg Intravenous Given 04/18/17 1412)  HYDROmorphone (DILAUDID) tablet 1 mg (1 mg Oral Given 04/18/17 1517)  methylPREDNISolone sodium succinate (SOLU-MEDROL) 125 mg/2 mL injection 125 mg (125 mg Intravenous Given 04/18/17 1656)     Initial Impression / Assessment and Plan / ED Course  I have reviewed the triage vital signs and the nursing notes.  Pertinent labs & imaging results that were available during my care of the patient were reviewed by me and considered in my medical decision making (see chart for details).     Patient with bilateral lower extremity swelling, pain.  States Feels similar to prior gout.  Recent gout attack and just finished steroid course.  Currently taking allopurinol and colchicine. Also taking ibuprofen. He has history of pulmonary embolism and currently not anticoagulated.  We will get a venous duplex to make sure he does not have a DVT.  No evidence of cellulitis.  No fever.  3:12 PM Venous duplex negative. Patient received a Percocet and now a Dilaudid dose IV for his pain.  He states his pain is still there although mildly improved.  Patient states he is still unable to ambulate.  Will give another dose of IV pain medicine.  Patient's pain has not improved.  He continues to state that he is unable to ambulate.  Patient signed out at shift change pending admission for gout treatment  Vitals:   04/18/17 2215 04/19/17 0036 04/19/17 0444 04/19/17 1321  BP: (!) 145/57 138/64 (!) 156/74 (!) 152/78  Pulse: 89 86 72 (!) 103  Resp: 16 16 16  (!) 22  Temp:   98.3 F (36.8 C) 97.7 F (36.5 C)  TempSrc:   Oral Oral   SpO2: 99% 97% 99% 98%  Weight:      Height:         Final Clinical Impressions(s) / ED Diagnoses   Final diagnoses:  Acute gouty arthritis  Unable to walk  Morbid obesity [facility])    New Prescriptions Current Discharge Medication List      Jeannett Senior, Hershal Coria 04/19/17 1615  Jola Schmidt, MD 04/20/17  0709 

## 2017-04-18 NOTE — ED Notes (Signed)
CBG 135  

## 2017-04-18 NOTE — ED Provider Notes (Signed)
4:58 PM Patient handoff from [place] PA-C at shift change.  Patient here with acute flare of gout over the past several weeks, unable to walk.  He has been bedbound for the past 2 days.  Lives at home by himself.  He has been on appropriate outpatient therapy including prednisone, allopurinol, colchicine for his symptoms however they continue to worsen.  Patient is unable to ambulate in the emergency department after several doses of IV pain medications and IV steroids.  Spoke with Dr. Carolin Sicks who will see patient, admit.   BP 132/66   Pulse 80   Temp 99.8 F (37.7 C) (Oral)   Resp 16   Ht 5\' 7"  (1.702 m)   Wt (!) 215.5 kg (475 lb)   SpO2 98%   BMI 74.40 kg/m     [name], PA-C 04/18/17 [place], MD 04/20/17 [phone]

## 2017-04-18 NOTE — H&P (Signed)
History and Physical    Ronnie Hernandez BOF:[id number] DOB: [birth year] DOA: 04/18/2017  PCP: [name], NP  Patient coming from:Home  Chief Complaint: Bilateral feet pain and unable to walk for 4 days  HPI: Ronnie Hernandez is a 56 y.o. male with medical history significant of morbid obesity, hyperlipidemia, hypertension, type 2 diabetes, gout with recent flare completed a course of prednisone taper about a week ago presented with worsening bilateral foot pain, erythema and unable to walk since Friday which is 4 days prior to admission. Patient reported that the pain was severe and not improving with current cardiac medications. He has difficulty walking since Friday and mostly bed bound. His friend usually helps him but now he has to work. Reported chills but no fever. Denied headache, dizziness, chest pain, shortness of breath, nausea vomiting. ED Course: Patient received IV pain medications and IV Solu-Medrol for acute cardiac flare with no improvement. Patient is admitted for further evaluation. I have discussed with the ER physician and ER nurse.  Review of Systems: As per HPI otherwise 10 point review of systems negative.    Past Medical History:  Diagnosis Date  . Arthritis    right fingers, both knees  . Diabetes type 2, uncontrolled ([place])   . Gout of hand   . Hyperlipidemia   . Hypertension   . Morbidly obese [facility])     Past Surgical History:  Procedure Laterality Date  . unremarkable      Social history: reports that he quit smoking about 35 years ago. He has never used smokeless tobacco. He reports that he does not drink alcohol or use drugs.  Allergies  Allergen Reactions  . Uloric [Febuxostat] Other (See Comments)    Made the skin on feet and legs "hurt"    Family History  Problem Relation Age of Onset  . Colon cancer Maternal Grandfather 53  . Heart attack Father   . Stomach cancer Maternal Aunt 80     Prior to Admission medications     Medication Sig Start Date End Date Taking? Authorizing Provider  allopurinol (ZYLOPRIM) 300 MG tablet Take 1 tablet (300 mg total) by mouth daily. 03/25/17  Yes Eulas Post, Monica, DO  colchicine 0.6 MG tablet Take 1 tablet (0.6 mg total) by mouth daily. 03/25/17  Yes Eulas Post, Monica, DO  furosemide (LASIX) 20 MG tablet Take 2 tablets (40 mg total) by mouth daily. 03/01/16  Yes [name], NP  ibuprofen (ADVIL,MOTRIN) 200 MG tablet Take 800 mg by mouth every 6 (six) hours as needed for mild pain.   Yes [provider]  lisinopril (PRINIVIL,ZESTRIL) 2.5 MG tablet Take 1 tablet (2.5 mg total) by mouth daily. 03/28/17  Yes Gildardo Cranker, DO  nystatin-triamcinolone ointment [facility]) Apply 1 application topically 2 (two) times daily. 11/29/16  Yes [name], NP  predniSONE (DELTASONE) 10 MG tablet Take 4 tabs po daily x 3 days then 3 tabs daily x 3 days then 2 tabs daily x 3 days then 1 tab daily x 3 days and stop for acute gout flare Patient not taking: Reported on 04/18/2017 03/25/17   Gildardo Cranker, DO    Physical Exam: Vitals:   04/18/17 1500 04/18/17 1530 04/18/17 1600 04/18/17 1630  BP: 129/67 110/71 120/73 132/66  Pulse: 82 80 81 80  Resp:  16    Temp:      TempSrc:      SpO2: 98% 100% 97% 98%  Weight:      Height:  Constitutional: NAD, calm, comfortable Vitals:   04/18/17 1500 04/18/17 1530 04/18/17 1600 04/18/17 1630  BP: 129/67 110/71 120/73 132/66  Pulse: 82 80 81 80  Resp:  16    Temp:      TempSrc:      SpO2: 98% 100% 97% 98%  Weight:      Height:       Eyes: PERRL, lids and conjunctivae normal ENMT: Mucous membranes are moist. Normal dentition.  Neck: normal, supple Respiratory: clear to auscultation bilaterally, no wheezing, no crackles. Normal respiratory effort. No accessory muscle use.  Cardiovascular: Regular rate and rhythm, S1S2 Normal. no murmurs / rubs / gallops. No extremity edema.   Abdomen: Soft, Non-tender,. Bowel sounds  positive.  Musculoskeletal: Bilateral feet swollen, erythematous mostly in metatarsal-phalangeal joint consistent with  gout Neurologic: Alert and awake and nonfocal neurological exam. Psychiatric: Normal judgment and insight. Alert and oriented x 3. Normal mood.    Labs on Admission: I have personally reviewed following labs and imaging studies  CBC:  Recent Labs Lab 04/18/17 1535  WBC 14.0*  NEUTROABS 10.3*  HGB 13.3  HCT 39.4  MCV 93.8  PLT 329*   Basic Metabolic Panel:  Recent Labs Lab 04/18/17 1535  NA 134*  K 3.9  CL 99*  CO2 25  GLUCOSE 121*  BUN 15  CREATININE 1.13  CALCIUM 8.4*   GFR: Estimated Creatinine Clearance: 128.4 mL/min (by C-G formula based on SCr of 1.13 mg/dL). Liver Function Tests: No results for input(s): AST, ALT, ALKPHOS, BILITOT, PROT, ALBUMIN in the last 168 hours. No results for input(s): LIPASE, AMYLASE in the last 168 hours. No results for input(s): AMMONIA in the last 168 hours. Coagulation Profile: No results for input(s): INR, PROTIME in the last 168 hours. Cardiac Enzymes: No results for input(s): CKTOTAL, CKMB, CKMBINDEX, TROPONINI in the last 168 hours. BNP (last 3 results) No results for input(s): PROBNP in the last 8760 hours. HbA1C: No results for input(s): HGBA1C in the last 72 hours. CBG:  Recent Labs Lab 04/18/17 1143  GLUCAP 135*   Lipid Profile: No results for input(s): CHOL, HDL, LDLCALC, TRIG, CHOLHDL, LDLDIRECT in the last 72 hours. Thyroid Function Tests: No results for input(s): TSH, T4TOTAL, FREET4, T3FREE, THYROIDAB in the last 72 hours. Anemia Panel: No results for input(s): VITAMINB12, FOLATE, FERRITIN, TIBC, IRON, RETICCTPCT in the last 72 hours. Urine analysis:    Component Value Date/Time   COLORURINE YELLOW 04/18/2017 1129   APPEARANCEUR CLEAR 04/18/2017 1129   APPEARANCEUR Clear 12/05/2015 1226   LABSPEC 1.004 (L) 04/18/2017 1129   PHURINE 6.0 04/18/2017 1129   GLUCOSEU NEGATIVE 04/18/2017  1129   HGBUR NEGATIVE 04/18/2017 [place] 04/18/2017 1129   BILIRUBINUR Negative 12/05/2015 1226   KETONESUR NEGATIVE 04/18/2017 1129   PROTEINUR NEGATIVE 04/18/2017 1129   UROBILINOGEN 0.2 12/28/2013 1117   NITRITE NEGATIVE 04/18/2017 1129   LEUKOCYTESUR NEGATIVE 04/18/2017 1129   LEUKOCYTESUR Negative 12/05/2015 1226   Sepsis Labs: !!!!!!!!!!!!!!!!!!!!!!!!!!!!!!!!!!!!!!!!!!!! @LABRCNTIP (procalcitonin:4,lacticidven:4) )No results found for this or any previous visit (from the past 240 hour(s)).   Radiological Exams on Admission: No results found.  Assessment/Plan Active Problems:   Diabetes type 2, uncontrolled (HCC)   Acute gouty arthropathy   Acute gout  #Acute gout flare refractory to outpatient oral medications/pain management: -Received IV Solu-Medrol and IV Dilaudid in the ER. Plan to continue IV Solu-Medrol twice a day, IV Dilaudid as needed for the pain management. I will continue patient's home medication including allopurinol, increased the  dose of colchicine. -Motrin as needed -Protonix prophylaxis -PT/OT and case manager evaluation -Hold Lasix -Monitor leukocytosis  #Type 2 diabetes in morbidly obese patient: Check A1c level. Start insulin sliding scale. Monitor blood sugar level especially while on steroid.  #History of hypertension: Continue lisinopril. Holding Lasix because of gout flare. Monitor blood pressure.  #Morbidly obese: Dietary referral.  Since patient has difficulty walking and has morbid obesity, he may need home care services on discharge.  DVT prophylaxis: Subcutaneous Lovenox Code Status: Full code Family Communication: No family at bedside Disposition Plan: Currently admitted Consults called: None Admission status: Observation. Need to assess for clinical improvement tomorrow.   Tyronza Happe Tanna Furry MD [facility] Pager [phone]  If 7PM-7AM, please contact night-coverage [email or web address] Password  [email or web address]  04/18/2017, 5:24 PM

## 2017-04-18 NOTE — ED Notes (Signed)
Supper tray delivered and served to pt.

## 2017-04-18 NOTE — Progress Notes (Signed)
BLE venous duplex  has been completed. Preliminary results can be found: Chart review -> CV Proc  Landry Mellow, RDMS, RVT

## 2017-04-19 ENCOUNTER — Observation Stay (HOSPITAL_COMMUNITY): Payer: Medicare HMO

## 2017-04-19 ENCOUNTER — Encounter (HOSPITAL_COMMUNITY): Payer: Self-pay | Admitting: General Practice

## 2017-04-19 DIAGNOSIS — M25561 Pain in right knee: Secondary | ICD-10-CM

## 2017-04-19 DIAGNOSIS — E1165 Type 2 diabetes mellitus with hyperglycemia: Secondary | ICD-10-CM | POA: Diagnosis not present

## 2017-04-19 DIAGNOSIS — R262 Difficulty in walking, not elsewhere classified: Secondary | ICD-10-CM | POA: Diagnosis not present

## 2017-04-19 DIAGNOSIS — M179 Osteoarthritis of knee, unspecified: Secondary | ICD-10-CM | POA: Diagnosis not present

## 2017-04-19 DIAGNOSIS — M25569 Pain in unspecified knee: Secondary | ICD-10-CM | POA: Insufficient documentation

## 2017-04-19 DIAGNOSIS — E119 Type 2 diabetes mellitus without complications: Secondary | ICD-10-CM | POA: Diagnosis not present

## 2017-04-19 DIAGNOSIS — M109 Gout, unspecified: Secondary | ICD-10-CM | POA: Diagnosis not present

## 2017-04-19 LAB — CBC
HCT: 45.5 % (ref 39.0–52.0)
HEMOGLOBIN: 15.2 g/dL (ref 13.0–17.0)
MCH: 31.3 pg (ref 26.0–34.0)
MCHC: 33.4 g/dL (ref 30.0–36.0)
MCV: 93.8 fL (ref 78.0–100.0)
PLATELETS: 168 10*3/uL (ref 150–400)
RBC: 4.85 MIL/uL (ref 4.22–5.81)
RDW: 13.6 % (ref 11.5–15.5)
WBC: 13.8 10*3/uL — AB (ref 4.0–10.5)

## 2017-04-19 LAB — CBG MONITORING, ED
Glucose-Capillary: 246 mg/dL — ABNORMAL HIGH (ref 65–99)
Glucose-Capillary: 314 mg/dL — ABNORMAL HIGH (ref 65–99)

## 2017-04-19 LAB — GLUCOSE, CAPILLARY
GLUCOSE-CAPILLARY: 369 mg/dL — AB (ref 65–99)
GLUCOSE-CAPILLARY: 359 mg/dL — AB (ref 65–99)

## 2017-04-19 MED ORDER — INSULIN GLARGINE 100 UNIT/ML ~~LOC~~ SOLN
15.0000 [IU] | Freq: Every day | SUBCUTANEOUS | Status: DC
  Administered 2017-04-19 – 2017-04-28 (×10): 15 [IU] via SUBCUTANEOUS
  Filled 2017-04-19 (×10): qty 0.15

## 2017-04-19 NOTE — ED Notes (Signed)
Pt talking w/friend at bedside and friend on phone.

## 2017-04-19 NOTE — Progress Notes (Signed)
PROGRESS NOTE    Ronnie Hernandez  RJJ:[id number] DOB: August 19, 1960 DOA: 04/18/2017 PCP: [name], NP   Brief Narrative:  56 y.o. male with medical history significant of morbid obesity, hyperlipidemia, hypertension, type 2 diabetes, gout with recent flare completed a course of prednisone taper about a week prior to admission presented with worsening bilateral foot pain, erythema and unable to walk for 4 days. Assessment & Plan:   #Acute gout flare refractory to outpatient oral medications/pain management: -Feet pain is mildly improved but he still complaining of pain,he is also complaining of right knee pain today. Plan to get x-ray of right knee. I will continue current dose of Solu-Medrol and pain management. Continue home medication including allopurinol, increased the dose of colchicine. -Motrin as needed -Protonix prophylaxis -PT/OT and case manager evaluation -Hold Lasix -Monitor leukocytosis  #Type 2 diabetes with hyperglycemia in morbidly obese patient: A1c 6. Patient has hyperglycemia  due to steroid.Start Lantus 15 units and continue sliding scale.  #History of hypertension: Continue lisinopril. Holding Lasix because of gout flare. Monitor blood pressure.  #Morbidly obese: Dietary referral.  DVT prophylaxis: Lovenox subcutaneous Code Status: Full code Family Communication: No family at bedside Disposition Plan: Currently admitted    Consultants:   None  Procedures: None Antimicrobials: None  Subjective: Seen and examined at bedside in the ER. Reported bilateral foot pain which may be markedly improved than yesterday. He is complaining of right knee pain. Denied nausea vomiting chest pain or shortness of breath.  Objective: Vitals:   04/18/17 2215 04/19/17 0036 04/19/17 0444 04/19/17 1321  BP: (!) 145/57 138/64 (!) 156/74 (!) 152/78  Pulse: 89 86 72 (!) 103  Resp: 16 16 16  (!) 22  Temp:   98.3 F (36.8 C) 97.7 F (36.5 C)  TempSrc:   Oral Oral   SpO2: 99% 97% 99% 98%  Weight:      Height:       No intake or output data in the 24 hours ending 04/19/17 1348 Filed Weights   04/18/17 0959  Weight: (!) 215.5 kg (475 lb)    Examination:  General exam: Appears calm and comfortable  Respiratory system: Clear to auscultation. Respiratory effort normal. No wheezing or crackle Cardiovascular system: S1 & S2 heard, RRR.  No pedal edema. Gastrointestinal system: Abdomen is nondistended, soft and nontender. Normal bowel sounds heard. Central nervous system: Alert and oriented. No focal neurological deficits. Extremities: Bilateral feet swelling and erythema is markedly improved. Right knee is tender but no change in the skin and no swelling. Skin: No rashes, lesions or ulcers Psychiatry: Judgement and insight appear normal. Mood & affect appropriate.     Data Reviewed: I have personally reviewed following labs and imaging studies  CBC:  Recent Labs Lab 04/18/17 1535 04/19/17 0445  WBC 14.0* 13.8*  NEUTROABS 10.3*  --   HGB 13.3 15.2  HCT 39.4 45.5  MCV 93.8 93.8  PLT 146* 016   Basic Metabolic Panel:  Recent Labs Lab 04/18/17 1535  NA 134*  K 3.9  CL 99*  CO2 25  GLUCOSE 121*  BUN 15  CREATININE 1.13  CALCIUM 8.4*   GFR: Estimated Creatinine Clearance: 128.4 mL/min (by C-G formula based on SCr of 1.13 mg/dL). Liver Function Tests: No results for input(s): AST, ALT, ALKPHOS, BILITOT, PROT, ALBUMIN in the last 168 hours. No results for input(s): LIPASE, AMYLASE in the last 168 hours. No results for input(s): AMMONIA in the last 168 hours. Coagulation Profile: No results for input(s):  INR, PROTIME in the last 168 hours. Cardiac Enzymes: No results for input(s): CKTOTAL, CKMB, CKMBINDEX, TROPONINI in the last 168 hours. BNP (last 3 results) No results for input(s): PROBNP in the last 8760 hours. HbA1C:  Recent Labs  04/18/17 1927  HGBA1C 6.0*   CBG:  Recent Labs Lab 04/18/17 1143 04/18/17 2204  04/19/17 0741 04/19/17 1218  GLUCAP 135* 199* 246* 314*   Lipid Profile: No results for input(s): CHOL, HDL, LDLCALC, TRIG, CHOLHDL, LDLDIRECT in the last 72 hours. Thyroid Function Tests: No results for input(s): TSH, T4TOTAL, FREET4, T3FREE, THYROIDAB in the last 72 hours. Anemia Panel: No results for input(s): VITAMINB12, FOLATE, FERRITIN, TIBC, IRON, RETICCTPCT in the last 72 hours. Sepsis Labs: No results for input(s): PROCALCITON, LATICACIDVEN in the last 168 hours.  No results found for this or any previous visit (from the past 240 hour(s)).       Radiology Studies: No results found.      Scheduled Meds: . allopurinol  300 mg Oral Daily  . colchicine  0.6 mg Oral BID  . enoxaparin (LOVENOX) injection  100 mg Subcutaneous Daily  . insulin aspart  0-5 Units Subcutaneous QHS  . insulin aspart  0-9 Units Subcutaneous TID WC  . insulin glargine  15 Units Subcutaneous Daily  . lisinopril  2.5 mg Oral Daily  . methylPREDNISolone (SOLU-MEDROL) injection  80 mg Intravenous Q12H  . pantoprazole  40 mg Oral Daily  . polyethylene glycol  17 g Oral Daily   Continuous Infusions:   LOS: 0 days    Dron Tanna Furry, MD [facility] Pager [phone]  If 7PM-7AM, please contact night-coverage [email or web address] Password [email or web address] 04/19/2017, 1:48 PM

## 2017-04-19 NOTE — ED Notes (Addendum)
Pt watching tv

## 2017-04-19 NOTE — ED Notes (Signed)
Pt given breakfast tray, pt currently eating.

## 2017-04-19 NOTE — ED Notes (Addendum)
Pt given Diet Ginger Ale as requested. Pt aware has bed assigned.

## 2017-04-19 NOTE — ED Notes (Signed)
Lunch tray ordered 

## 2017-04-19 NOTE — ED Notes (Signed)
Pt being transported to X-ray then 2W26 via stretcher.

## 2017-04-19 NOTE — ED Notes (Signed)
Friend visiting w/pt.

## 2017-04-19 NOTE — ED Notes (Signed)
Admitting MD's in w/pt. Advising ordering x-ray of right knee.

## 2017-04-19 NOTE — ED Notes (Signed)
Pt CBG was 246, notified Rebecca(RN)

## 2017-04-19 NOTE — ED Notes (Signed)
States has been bedridden x 3 days d/t bil feet/ankle pain. Also c/o right knee pain. States friend would help clean him when he had BM at home.

## 2017-04-19 NOTE — ED Notes (Signed)
Pt CBG was 314, notified Rebecca(RN)

## 2017-04-19 NOTE — ED Notes (Signed)
2W RN aware of need to order bariatric bed for pt.

## 2017-04-19 NOTE — ED Notes (Signed)
Pt reading newspaper.  

## 2017-04-20 DIAGNOSIS — M25561 Pain in right knee: Secondary | ICD-10-CM | POA: Diagnosis not present

## 2017-04-20 DIAGNOSIS — E1165 Type 2 diabetes mellitus with hyperglycemia: Secondary | ICD-10-CM | POA: Diagnosis not present

## 2017-04-20 DIAGNOSIS — M109 Gout, unspecified: Secondary | ICD-10-CM | POA: Diagnosis not present

## 2017-04-20 LAB — GLUCOSE, CAPILLARY
GLUCOSE-CAPILLARY: 317 mg/dL — AB (ref 65–99)
GLUCOSE-CAPILLARY: 316 mg/dL — AB (ref 65–99)
GLUCOSE-CAPILLARY: 267 mg/dL — AB (ref 65–99)
Glucose-Capillary: 290 mg/dL — ABNORMAL HIGH (ref 65–99)

## 2017-04-20 MED ORDER — FUROSEMIDE 10 MG/ML IJ SOLN
20.0000 mg | Freq: Every day | INTRAMUSCULAR | Status: DC
  Administered 2017-04-21 – 2017-04-26 (×6): 20 mg via INTRAVENOUS
  Filled 2017-04-20 (×6): qty 2

## 2017-04-20 MED ORDER — METHYLPREDNISOLONE SODIUM SUCC 125 MG IJ SOLR
60.0000 mg | Freq: Two times a day (BID) | INTRAMUSCULAR | Status: DC
  Administered 2017-04-21: 60 mg via INTRAVENOUS
  Filled 2017-04-20: qty 2

## 2017-04-20 MED ORDER — DICLOFENAC SODIUM 1 % TD GEL
2.0000 g | Freq: Three times a day (TID) | TRANSDERMAL | Status: DC
  Administered 2017-04-20 – 2017-04-27 (×20): 2 g via TOPICAL
  Filled 2017-04-20 (×2): qty 100

## 2017-04-20 NOTE — Plan of Care (Signed)
Problem: Food- and Nutrition-Related Knowledge Deficit (NB-1.1) Goal: Nutrition education Formal process to instruct or train a patient/client in a skill or to impart knowledge to help patients/clients voluntarily manage or modify food choices and eating behavior to maintain or improve health. Outcome: Completed/Met Date Met: 04/20/17  RD consulted for nutrition education regarding obesity.  Body mass index is 69.06 kg/m. Pt meets criteria for morbid obesity based on current BMI.  RD provided "General, Healthful Nutrition Therapy" handout from the Academy of [employer]. Review pt's dietary recall. Pt often eats out and snacks throughout the day. Examples of snacks include ice cream, potato chips, and cookies.   Emphasized the importance of serving sizes and provided examples of correct portions of common foods. Discussed importance of controlled and consistent intake throughout the day. Pt reports occasionally skipping meals, however RD discussed consistency will help with hunger cues as well as diabetes management. RD discussed reducing distractions while eating to listen to his body's hunger vs satiety cues.   Provided examples of ways to balance meals/snacks and encouraged intake of high-fiber, whole grain complex carbohydrates. Pt reports he will try to eat more "brown bread". RD emphasized looking at food label to find the best options. Pt reports he likes vegetables and apples, and he will try to consume more of them. RD discussed the plate method with pt.   Emphasized the importance of hydration with calorie-free beverages and limiting sugar-sweetened beverages. Encouraged pt to discuss physical activity options with physician. Pt's trainer recently moved and he has had some barriers to working out but is hopeful to get back into the gym. Teach back method used.  Discussed carbohydrate counting briefly. Of note, pt's Hemoglobin A1C read at 6.0 on 10/29. Pt reports not checking  his blood sugars at baseline.    Expect fair compliance. Pt seemed very eager to change, asking related questions.  Current diet order is Carb Modified, patient is consuming approximately 100% of meals at this time. Labs and medications reviewed. No further nutrition interventions warranted at this time. RD contact information provided. If additional nutrition issues arise, please re-consult RD.  Parks Ranger, MS, RDN, LDN 04/20/2017 10:40 AM

## 2017-04-20 NOTE — Evaluation (Signed)
Physical Therapy Evaluation Patient Details Name: Ronnie Hernandez MRN: [id number] DOB: [birth year] Today's Date: 04/20/2017   History of Present Illness  Ronnie Hernandez is a 56 y.o. male with medical history significant of morbid obesity, hyperlipidemia, hypertension, type 2 diabetes, gout with recent flare completed a course of prednisone taper about a week ago presented with severe bilateral foot pain, erythema and unable to walk since 10/26 which is 4 days prior to admission.  Clinical Impression  Pt admitted with above diagnosis. Pt currently with functional limitations due to the deficits listed below (see PT Problem List). Mobility is significantly limited by pain bil feet and ankles and R knee; Unable to bear weight bil LEs to be able to perform transfers; I'm hop;eful that once gout pain is under control that he will make a fast recovery back to baseline; will monitor;  Pt will benefit from skilled PT to increase their independence and safety with mobility to allow discharge to the venue listed below.       Follow Up Recommendations SNF (at current functional level, must consider SNF; hopeful for quick recovery once gout pain is under control, and that he will be able to dc home with [facility] services)    Equipment Recommendations  Other (comment) (Bariatric RW)    Recommendations for Other Services       Precautions / Restrictions Precautions Precaution Comments: bariatric patient #440 Required Braces or Orthoses:  (Pt prefers to wear shoes when feet are on the ground) Restrictions Weight Bearing Restrictions: No      Mobility  Bed Mobility Overal bed mobility: Needs Assistance Bed Mobility: Supine to Sit;Rolling;Sit to Sidelying Rolling: Supervision   Supine to sit: Mod assist;+2 for physical assistance;HOB elevated (+ initiation w/BLE, assit for trunk elevation, use of rails)   Sit to sidelying: Mod assist (assist for BLE elavation into bed) General bed mobility  comments: Pt is able to scoot to the EOB with use of rail and min A from therapist, in trendelenburg pt is able to pull himself up.   Transfers Overall transfer level: Needs assistance Equipment used: Rolling walker (2 wheeled) (bariatric) Transfers: Sit to/from Stand Sit to Stand: Max assist;+2 physical assistance;+2 safety/equipment         General transfer comment: sit to stand x2, 1st attempt not successful, use of bed pad to assist with rise, Pt unable to come to full upright this session  Ambulation/Gait                Stairs            Wheelchair Mobility    Modified Rankin (Stroke Patients Only)       Balance     Sitting balance-Leahy Scale: Good       Standing balance-Leahy Scale: Zero (approaching Poor) Standing balance comment: reliant on external supports                             Pertinent Vitals/Pain Pain Assessment: 0-10 Pain Score: 3  Pain Location: Bil Feet and R knee; pain increased a whole lot with standing trial -- he did not rate Pain Descriptors / Indicators: Discomfort;Sore;Tender Pain Intervention(s): Monitored during session    Home Living Family/patient expects to be discharged to:: Private residence Living Arrangements: Non-relatives/Friends Available Help at Discharge: Family;Friend(s);Available PRN/intermittently Type of Home: Apartment Home Access: Level entry     Home Layout: One level Home Equipment: Walker - 4 wheels;Bedside commode;Tub bench;Cane -  quad;Grab bars - tub/shower;Grab bars - toilet;Hand held shower head;Adaptive equipment      Prior Function Level of Independence: Independent with assistive device(s)         Comments: Uses quad cane for mobility, uses power grocery store carts     Hand Dominance   Dominant Hand: Right    Extremity/Trunk Assessment   Upper Extremity Assessment Upper Extremity Assessment: Defer to OT evaluation    Lower Extremity Assessment Lower Extremity  Assessment: Generalized weakness;RLE deficits/detail;LLE deficits/detail RLE Deficits / Details: ROM grossly limited by body habitus; painful R knee, mostly with weight bearing; adequate ROM for simple bed mobiltiy and standing trials; noted tender area first MTP joint with erythema RLE: Unable to fully assess due to pain LLE Deficits / Details: ROM grossly limited throughoutdue to body habitus; noted tender area first MTP joint with erythema       Communication   Communication: No difficulties  Cognition Arousal/Alertness: Awake/alert Behavior During Therapy: WFL for tasks assessed/performed Overall Cognitive Status: Within Functional Limits for tasks assessed                                        General Comments      Exercises     Assessment/Plan    PT Assessment Patient needs continued PT services  PT Problem List Decreased strength;Decreased range of motion;Decreased activity tolerance;Decreased balance;Decreased mobility;Decreased coordination;Decreased knowledge of use of DME;Decreased knowledge of precautions;Pain       PT Treatment Interventions DME instruction;Gait training;Functional mobility training;Therapeutic activities;Therapeutic exercise;Balance training;Patient/family education    PT Goals (Current goals can be found in the Care Plan section)  Acute Rehab PT Goals Patient Stated Goal: To go home if possible PT Goal Formulation: With patient Time For Goal Achievement: 05/04/17 Potential to Achieve Goals: Fair    Frequency Min 3X/week   Barriers to discharge Decreased caregiver support      Co-evaluation PT/OT/SLP Co-Evaluation/Treatment: Yes Reason for Co-Treatment: For patient/therapist safety PT goals addressed during session: Mobility/safety with mobility         AM-PAC PT "6 Clicks" Daily Activity  Outcome Measure Difficulty turning over in bed (including adjusting bedclothes, sheets and blankets)?: Unable Difficulty moving  from lying on back to sitting on the side of the bed? : Unable Difficulty sitting down on and standing up from a chair with arms (e.g., wheelchair, bedside commode, etc,.)?: Unable Help needed moving to and from a bed to chair (including a wheelchair)?: Total Help needed walking in hospital room?: Total Help needed climbing 3-5 steps with a railing? : Total 6 Click Score: 6    End of Session   Activity Tolerance: Patient limited by pain Patient left: in bed;with call bell/phone within reach Nurse Communication: Mobility status PT Visit Diagnosis: Other abnormalities of gait and mobility (R26.89);Pain Pain - Right/Left:  (Bilateral) Pain - part of body: Ankle and joints of foot;Knee (R knee)    Time: 9678-9381 PT Time Calculation (min) (ACUTE ONLY): 19 min   Charges:   PT Evaluation $PT Eval Moderate Complexity: 1 Mod     PT G Codes:        Roney Marion, PT  Acute Rehabilitation Services Pager [phone] Office [phone]   [place] 04/20/2017, 3:30 PM

## 2017-04-20 NOTE — Progress Notes (Signed)
PROGRESS NOTE  Ronnie Hernandez DOB: 01/15/1961 DOA: 04/18/2017 PCP: [name], NP  HPI/Recap of past 57 hours: 56 year old male with medical history significant for morbid obesity, hyperlipidemia, hypertension, diabetes, severe gout that presents with worsening bilateral foot pain, erythema and inability to walk for the past 4 days.  Patient is currently being managed for recurrent gout flareup despite completing a course of prednisone taper about a week ago prior to admission.  Today, patient reports bilateral feet pain is improved, also complains of right knee pain.  Patient denies any other symptoms denies chest pain, shortness of breath, abdominal pain, nausea/vomiting, diarrhea, fever/chills.  Patient was started on IV Solu-Medrol and pain management.  Assessment/Plan: Active Problems:   Diabetes type 2, uncontrolled (HCC)   Acute gouty arthritis   Acute gout  #Acute gout flare refractory to outpatient oral medications Improving, history of severe gout, tophi noted Afebrile, with mild leukocytosis (likley from steroid) Uric acid level within normal limits Continue IV Solu-Medrol, dose decreased to begin taper Continue allopurinol, colchicine Motrin as needed, with Protonix prophylaxis PT/OT on board Daily CBC XRAY of feet in am  #Right knee pain X-ray showed severe osteoarthritis Likely secondary to morbid obesity Topical diclofenac on right knee Follow-up with PCP  #Type 2 diabetes with hyperglycemia A1c 6 Hyperglycemia due to steroid use SSI, Lantus 15 units  #Hypertension Continue lisinopril Restart Lasix as bilateral pedal edema worsening  #Morbid obesity Lifestyle modification advised   Code Status: Full  Family Communication: None at bedside  Disposition Plan: Home once stable   Consultants:  None  Procedures: None  Antimicrobials:  None  DVT prophylaxis: Lovenox   Objective: Vitals:   04/19/17 1728 04/19/17  2040 04/20/17 0434 04/20/17 1000  BP: (!) 149/63 124/64 138/67 111/77  Pulse: 96 89 84 80  Resp: 20 18 16 18   Temp: 97.6 F (36.4 C) 98.2 F (36.8 C) (!) 97.5 F (36.4 C) 98 F (36.7 C)  TempSrc: Oral   Oral  SpO2: 98% 94% 96% 98%  Weight: (!) 200 kg (441 lb) (!) 200 kg (440 lb 14.7 oz)    Height:        Intake/Output Summary (Last 24 hours) at 04/20/17 1746 Last data filed at 04/20/17 1400  Gross per 24 hour  Intake                0 ml  Output             2110 ml  Net            -2110 ml   Filed Weights   04/18/17 0959 04/19/17 1728 04/19/17 2040  Weight: (!) 215.5 kg (475 lb) (!) 200 kg (441 lb) (!) 200 kg (440 lb 14.7 oz)    Exam:   General: Alert, awake, oriented x3  Cardiovascular: S1-S2 heard, no added heart sounds  Respiratory: Chest clear bilaterally  Abdomen: Soft, obese, nondistended  Musculoskeletal: Bilateral feet swelling, mild erythema, chronic venous stasis noted bilateral LE, right knee tender, no warmth, no swelling  Skin: Normal  Psychiatry: Normal mood   Data Reviewed: CBC:  Recent Labs Lab 04/18/17 1535 04/19/17 0445  WBC 14.0* 13.8*  NEUTROABS 10.3*  --   HGB 13.3 15.2  HCT 39.4 45.5  MCV 93.8 93.8  PLT 146* 361   Basic Metabolic Panel:  Recent Labs Lab 04/18/17 1535  NA 134*  K 3.9  CL 99*  CO2 25  GLUCOSE 121*  BUN 15  CREATININE  1.13  CALCIUM 8.4*   GFR: Estimated Creatinine Clearance: 122.1 mL/min (by C-G formula based on SCr of 1.13 mg/dL). Liver Function Tests: No results for input(s): AST, ALT, ALKPHOS, BILITOT, PROT, ALBUMIN in the last 168 hours. No results for input(s): LIPASE, AMYLASE in the last 168 hours. No results for input(s): AMMONIA in the last 168 hours. Coagulation Profile: No results for input(s): INR, PROTIME in the last 168 hours. Cardiac Enzymes: No results for input(s): CKTOTAL, CKMB, CKMBINDEX, TROPONINI in the last 168 hours. BNP (last 3 results) No results for input(s): PROBNP in the  last 8760 hours. HbA1C:  Recent Labs  04/18/17 1927  HGBA1C 6.0*   CBG:  Recent Labs Lab 04/19/17 1630 04/19/17 2128 04/20/17 0744 04/20/17 1138 04/20/17 1641  GLUCAP 369* 359* 290* 317* 316*   Lipid Profile: No results for input(s): CHOL, HDL, LDLCALC, TRIG, CHOLHDL, LDLDIRECT in the last 72 hours. Thyroid Function Tests: No results for input(s): TSH, T4TOTAL, FREET4, T3FREE, THYROIDAB in the last 72 hours. Anemia Panel: No results for input(s): VITAMINB12, FOLATE, FERRITIN, TIBC, IRON, RETICCTPCT in the last 72 hours. Urine analysis:    Component Value Date/Time   COLORURINE YELLOW 04/18/2017 1129   APPEARANCEUR CLEAR 04/18/2017 1129   APPEARANCEUR Clear 12/05/2015 1226   LABSPEC 1.004 (L) 04/18/2017 1129   PHURINE 6.0 04/18/2017 1129   GLUCOSEU NEGATIVE 04/18/2017 1129   HGBUR NEGATIVE 04/18/2017 [place] 04/18/2017 1129   BILIRUBINUR Negative 12/05/2015 1226   KETONESUR NEGATIVE 04/18/2017 1129   PROTEINUR NEGATIVE 04/18/2017 1129   UROBILINOGEN 0.2 12/28/2013 1117   NITRITE NEGATIVE 04/18/2017 1129   LEUKOCYTESUR NEGATIVE 04/18/2017 1129   LEUKOCYTESUR Negative 12/05/2015 1226   Sepsis Labs: @LABRCNTIP (procalcitonin:4,lacticidven:4)  )No results found for this or any previous visit (from the past 240 hour(s)).    Studies: No results found.  Scheduled Meds: . allopurinol  300 mg Oral Daily  . colchicine  0.6 mg Oral BID  . diclofenac sodium  2 g Topical TID  . enoxaparin (LOVENOX) injection  100 mg Subcutaneous Daily  . [START ON 04/21/2017] furosemide  20 mg Intravenous Daily  . insulin aspart  0-5 Units Subcutaneous QHS  . insulin aspart  0-9 Units Subcutaneous TID WC  . insulin glargine  15 Units Subcutaneous Daily  . lisinopril  2.5 mg Oral Daily  . [START ON 04/21/2017] methylPREDNISolone (SOLU-MEDROL) injection  60 mg Intravenous Q12H  . pantoprazole  40 mg Oral Daily  . polyethylene glycol  17 g Oral Daily    Continuous  Infusions:   LOS: 0 days     Alma Friendly, MD [facility] Pager [phone]  If 7PM-7AM, please contact night-coverage [email or web address] Password [email or web address] 04/20/2017, 5:46 PM

## 2017-04-20 NOTE — Progress Notes (Signed)
Inpatient Diabetes Program Recommendations  AACE/ADA: New Consensus Statement on Inpatient Glycemic Control (2015)  Target Ranges:  Prepandial:   less than 140 mg/dL      Peak postprandial:   less than 180 mg/dL (1-2 hours)      Critically ill patients:  140 - 180 mg/dL   Lab Results  Component Value Date   GLUCAP 317 (H) 04/20/2017   HGBA1C 6.0 (H) 04/18/2017    Review of Glycemic ControlResults for MELQUAN, ERNSBERGER (MRN [id number]) as of 04/20/2017 11:53  Ref. Range 04/19/2017 12:18 04/19/2017 16:30 04/19/2017 21:28 04/20/2017 07:44 04/20/2017 11:38  Glucose-Capillary Latest Ref Range: 65 - 99 mg/dL 314 (H) 369 (H) 359 (H) 290 (H) 317 (H)   Diabetes history: None- A1C=6.0 (?pre-diabetes) Outpatient Diabetes medications: None Current orders for Inpatient glycemic control:  Lantus 15 units daily, Novolog sensitive tid with meals and HS Solumedrol 80 mg IV q 12 hours Inpatient Diabetes Program Recommendations:   Please consider increasing Novolog correction to resistant tid with meals and HS (while on steroids).  Also consider increasing Lantus to 30 units daily.   Thanks, Adah Perl, RN, BC-ADM Inpatient Diabetes Coordinator Pager [phone] (8a-5p)

## 2017-04-20 NOTE — Evaluation (Signed)
Occupational Therapy Evaluation Patient Details Name: Ronnie Hernandez MRN: [id number] DOB: 22-Nov-1960 Today's Date: 04/20/2017    History of Present Illness Ronnie Hernandez is a 56 y.o. male with medical history significant of morbid obesity, hyperlipidemia, hypertension, type 2 diabetes, gout with recent flare completed a course of prednisone taper about a week ago presented with severe bilateral foot pain, erythema and unable to walk since 10/26 which is 4 days prior to admission.   Clinical Impression   Pt is a very pleasant man, currently living with an old friend/roomate. PTA Pt modified independent in ADL with AE and mobility with quad cane. Pt is currently max A for LB ADL and max A +2 for sit to stand with bariatric RW. Please see OT problem list below. Pt will benefit from skilled OT in the acute setting as well as afterwards at SNF level to maximize safety and independence in ADL and functional transfers to return to PLOF. Next session to focus on sitting balance and increased ability to perform LB dressing as it is very important to him to wear shoes during transfers (he feels more secure with his weight and body habitus).    Follow Up Recommendations  SNF;Supervision/Assistance - 24 hour    Equipment Recommendations  None recommended by OT (Pt has appropriate DME)    Recommendations for Other Services       Precautions / Restrictions Precautions Precaution Comments: bariatric patient #440 Required Braces or Orthoses:  (Pt prefers to wear shoes when feet are on the ground) Restrictions Weight Bearing Restrictions: No      Mobility Bed Mobility Overal bed mobility: Needs Assistance Bed Mobility: Supine to Sit;Rolling;Sit to Sidelying Rolling: Supervision   Supine to sit: Mod assist;+2 for physical assistance;HOB elevated (+ initiation w/BLE, assit for trunk elevation, use of rails)   Sit to sidelying: Mod assist (assist for BLE elavation into bed) General bed  mobility comments: Pt is able to scoot to the EOB with use of rail and min A from therapist, in trendelenburg pt is able to pull himself up.   Transfers Overall transfer level: Needs assistance Equipment used: Rolling walker (2 wheeled) (bariatric) Transfers: Sit to/from Stand Sit to Stand: Max assist;+2 physical assistance;+2 safety/equipment         General transfer comment: sit to stand x2, 1st attempt not successful, use of bed pad to assist with rise, Pt unable to come to full upright this session    Balance Overall balance assessment: Needs assistance Sitting-balance support: No upper extremity supported;Feet supported Sitting balance-Leahy Scale: Good Sitting balance - Comments: sitting EOB with no back support   Standing balance support: Bilateral upper extremity supported Standing balance-Leahy Scale: Poor Standing balance comment: reliant on external supports                           ADL either performed or assessed with clinical judgement   ADL Overall ADL's : Needs assistance/impaired Eating/Feeding: Set up   Grooming: Wash/dry face;Set up;Bed level   Upper Body Bathing: Moderate assistance   Lower Body Bathing: Total assistance   Upper Body Dressing : Set up;Sitting   Lower Body Dressing: Maximal assistance;+2 for physical assistance;+2 for safety/equipment   Toilet Transfer:  (unsafe for transfer at this time, using urinal and bed pan)   Toileting- Clothing Manipulation and Hygiene: Total assistance       Functional mobility during ADLs:  (unsafe at this time)  Vision Patient Visual Report: No change from baseline       Perception     Praxis      Pertinent Vitals/Pain Pain Assessment: 0-10 Pain Score: 3  Pain Location: Bil Feet and R knee Pain Descriptors / Indicators: Discomfort;Sore;Tender Pain Intervention(s): Monitored during session;Repositioned     Hand Dominance Right   Extremity/Trunk Assessment Upper Extremity  Assessment Upper Extremity Assessment: Overall WFL for tasks assessed   Lower Extremity Assessment Lower Extremity Assessment: Defer to PT evaluation   Cervical / Trunk Assessment Cervical / Trunk Assessment:  (extra body habitus)   Communication Communication Communication: No difficulties   Cognition Arousal/Alertness: Awake/alert Behavior During Therapy: WFL for tasks assessed/performed Overall Cognitive Status: Within Functional Limits for tasks assessed                                     General Comments       Exercises     Shoulder Instructions      Home Living Family/patient expects to be discharged to:: Private residence Living Arrangements: Non-relatives/Friends Available Help at Discharge: Family;Friend(s);Available PRN/intermittently Type of Home: Apartment Home Access: Level entry     Home Layout: One level     Bathroom Shower/Tub: Occupational psychologist: Handicapped height Bathroom Accessibility: Yes How Accessible: Accessible via wheelchair Home Equipment: [place] - 4 wheels;Bedside commode;Tub bench;Cane - quad;Grab bars - tub/shower;Grab bars - toilet;Hand held shower head;Adaptive equipment Adaptive Equipment: Reacher        Prior Functioning/Environment Level of Independence: Independent with assistive device(s)        Comments: uses SPC for mobility, uses electric grocery cart at store        OT Problem List: Decreased activity tolerance;Impaired balance (sitting and/or standing);Obesity;Pain;Increased edema      OT Treatment/Interventions: Self-care/ADL training;DME and/or AE instruction;Therapeutic exercise;Therapeutic activities;Patient/family education;Balance training    OT Goals(Current goals can be found in the care plan section) Acute Rehab OT Goals Patient Stated Goal: To go home if possible OT Goal Formulation: With patient Time For Goal Achievement: 05/04/17 Potential to Achieve Goals: Good ADL  Goals Pt Will Perform Lower Body Bathing: with set-up;with adaptive equipment;sitting/lateral leans Pt Will Perform Lower Body Dressing: with mod assist;sit to/from stand Pt Will Transfer to Toilet: with mod assist;stand pivot transfer;bedside commode Pt Will Perform Toileting - Clothing Manipulation and hygiene: with max assist;sit to/from stand Additional ADL Goal #1: Pt will perform bed mobility at supervision level prior to initiation of ADL activity  OT Frequency: Min 2X/week   Barriers to D/C: Decreased caregiver support  intermittent assist at best       Co-evaluation              AM-PAC PT "6 Clicks" Daily Activity     Outcome Measure Help from another person eating meals?: None Help from another person taking care of personal grooming?: A Little Help from another person toileting, which includes using toliet, bedpan, or urinal?: Total Help from another person bathing (including washing, rinsing, drying)?: A Lot Help from another person to put on and taking off regular upper body clothing?: A Little Help from another person to put on and taking off regular lower body clothing?: Total 6 Click Score: 14   End of Session Equipment Utilized During Treatment: Rolling walker Nurse Communication: Mobility status  Activity Tolerance: Patient limited by pain Patient left: in bed;with call bell/phone within reach  OT  Visit Diagnosis: Unsteadiness on feet (R26.81);Other abnormalities of gait and mobility (R26.89);Pain Pain - Right/Left: Right (and left too) Pain - part of body: Knee;Ankle and joints of foot                Time: 3500-9381 OT Time Calculation (min): 20 min Charges:  OT General Charges $OT Visit: 1 Visit OT Evaluation $OT Eval Moderate Complexity: 1 Mod G-Codes: OT G-codes **NOT FOR INPATIENT CLASS** Functional Assessment Tool Used: AM-PAC 6 Clicks Daily Activity Functional Limitation: Self care Self Care Current Status (W2993): At least 40 percent but less  than 60 percent impaired, limited or restricted Self Care Goal Status (Z1696): At least 1 percent but less than 20 percent impaired, limited or restricted   Hulda Humphrey OTR/L [place] 04/20/2017, 12:16 PM

## 2017-04-20 NOTE — Care Management Note (Signed)
Case Management Note  Patient Details  Name: Ronnie Hernandez MRN: [id number] Date of Birth: 10/05/1960  Subjective/Objective:                 Spoke w patient at the bedside, he lives at home alone, has friends that come and help him. He drives. He has a 4 point cane at bedside from home. He states he would like an elctric WC and CM instructed him he would need t obtain this through his PCP, he verbalized understanding. He is working w PT and he states he is getting much better each day with his movement. He states that he would like to use [facility] if [facility] services are needed at the time of DC.    Action/Plan:   CM will continue to follow and make referrals as needed.  Expected Discharge Date:                  Expected Discharge Plan:  [place]  In-House Referral:     Discharge planning Services  CM Consult  Post Acute Care Choice:  Home Health Choice offered to:  Patient  DME Arranged:    DME Agency:     HH Arranged:    HH Agency:  [facility]  Status of Service:  In process, will continue to follow  If discussed at Long Length of Stay Meetings, dates discussed:    Additional Comments:  Carles Collet, RN 04/20/2017, 1:26 PM

## 2017-04-21 ENCOUNTER — Observation Stay (HOSPITAL_COMMUNITY): Payer: Medicare HMO

## 2017-04-21 DIAGNOSIS — M10072 Idiopathic gout, left ankle and foot: Secondary | ICD-10-CM | POA: Diagnosis not present

## 2017-04-21 DIAGNOSIS — M109 Gout, unspecified: Secondary | ICD-10-CM | POA: Diagnosis not present

## 2017-04-21 DIAGNOSIS — Z7401 Bed confinement status: Secondary | ICD-10-CM | POA: Diagnosis not present

## 2017-04-21 DIAGNOSIS — T380X5A Adverse effect of glucocorticoids and synthetic analogues, initial encounter: Secondary | ICD-10-CM | POA: Diagnosis present

## 2017-04-21 DIAGNOSIS — D72829 Elevated white blood cell count, unspecified: Secondary | ICD-10-CM | POA: Diagnosis not present

## 2017-04-21 DIAGNOSIS — E1165 Type 2 diabetes mellitus with hyperglycemia: Secondary | ICD-10-CM | POA: Diagnosis not present

## 2017-04-21 DIAGNOSIS — M79661 Pain in right lower leg: Secondary | ICD-10-CM | POA: Diagnosis present

## 2017-04-21 DIAGNOSIS — Z8249 Family history of ischemic heart disease and other diseases of the circulatory system: Secondary | ICD-10-CM | POA: Diagnosis not present

## 2017-04-21 DIAGNOSIS — R269 Unspecified abnormalities of gait and mobility: Secondary | ICD-10-CM | POA: Diagnosis not present

## 2017-04-21 DIAGNOSIS — M25569 Pain in unspecified knee: Secondary | ICD-10-CM | POA: Diagnosis not present

## 2017-04-21 DIAGNOSIS — R262 Difficulty in walking, not elsewhere classified: Secondary | ICD-10-CM | POA: Diagnosis not present

## 2017-04-21 DIAGNOSIS — Z87891 Personal history of nicotine dependence: Secondary | ICD-10-CM | POA: Diagnosis not present

## 2017-04-21 DIAGNOSIS — Z6841 Body Mass Index (BMI) 40.0 and over, adult: Secondary | ICD-10-CM | POA: Diagnosis not present

## 2017-04-21 DIAGNOSIS — I10 Essential (primary) hypertension: Secondary | ICD-10-CM | POA: Diagnosis not present

## 2017-04-21 DIAGNOSIS — M19072 Primary osteoarthritis, left ankle and foot: Secondary | ICD-10-CM | POA: Diagnosis not present

## 2017-04-21 DIAGNOSIS — M10071 Idiopathic gout, right ankle and foot: Secondary | ICD-10-CM | POA: Diagnosis not present

## 2017-04-21 DIAGNOSIS — E785 Hyperlipidemia, unspecified: Secondary | ICD-10-CM | POA: Diagnosis not present

## 2017-04-21 DIAGNOSIS — M19071 Primary osteoarthritis, right ankle and foot: Secondary | ICD-10-CM | POA: Diagnosis not present

## 2017-04-21 DIAGNOSIS — M25561 Pain in right knee: Secondary | ICD-10-CM | POA: Diagnosis not present

## 2017-04-21 LAB — CBC WITH DIFFERENTIAL/PLATELET
BASOS ABS: 0 10*3/uL (ref 0.0–0.1)
BLASTS: 0 %
Band Neutrophils: 6 %
Basophils Relative: 0 %
Eosinophils Absolute: 0 10*3/uL (ref 0.0–0.7)
Eosinophils Relative: 0 %
HEMATOCRIT: 43.2 % (ref 39.0–52.0)
Hemoglobin: 14.5 g/dL (ref 13.0–17.0)
Lymphocytes Relative: 12 %
Lymphs Abs: 1.7 10*3/uL (ref 0.7–4.0)
MCH: 31.5 pg (ref 26.0–34.0)
MCHC: 33.6 g/dL (ref 30.0–36.0)
MCV: 93.9 fL (ref 78.0–100.0)
METAMYELOCYTES PCT: 0 %
MONOS PCT: 2 %
MYELOCYTES: 0 %
Monocytes Absolute: 0.3 10*3/uL (ref 0.1–1.0)
NEUTROS PCT: 80 %
NRBC: 0 /100{WBCs}
Neutro Abs: 12.4 10*3/uL — ABNORMAL HIGH (ref 1.7–7.7)
Other: 0 %
Platelets: 241 10*3/uL (ref 150–400)
Promyelocytes Absolute: 0 %
RBC: 4.6 MIL/uL (ref 4.22–5.81)
RDW: 13.4 % (ref 11.5–15.5)
WBC: 14.4 10*3/uL — AB (ref 4.0–10.5)

## 2017-04-21 LAB — BASIC METABOLIC PANEL
Anion gap: 7 (ref 5–15)
BUN: 26 mg/dL — AB (ref 6–20)
CO2: 26 mmol/L (ref 22–32)
CREATININE: 1 mg/dL (ref 0.61–1.24)
Calcium: 8.6 mg/dL — ABNORMAL LOW (ref 8.9–10.3)
Chloride: 103 mmol/L (ref 101–111)
Glucose, Bld: 263 mg/dL — ABNORMAL HIGH (ref 65–99)
POTASSIUM: 5 mmol/L (ref 3.5–5.1)
SODIUM: 136 mmol/L (ref 135–145)

## 2017-04-21 LAB — GLUCOSE, CAPILLARY
GLUCOSE-CAPILLARY: 211 mg/dL — AB (ref 65–99)
GLUCOSE-CAPILLARY: 226 mg/dL — AB (ref 65–99)
Glucose-Capillary: 246 mg/dL — ABNORMAL HIGH (ref 65–99)
Glucose-Capillary: 182 mg/dL — ABNORMAL HIGH (ref 65–99)

## 2017-04-21 MED ORDER — METHYLPREDNISOLONE SODIUM SUCC 40 MG IJ SOLR
40.0000 mg | Freq: Two times a day (BID) | INTRAMUSCULAR | Status: DC
  Administered 2017-04-22 (×2): 40 mg via INTRAVENOUS
  Filled 2017-04-21 (×2): qty 1

## 2017-04-21 NOTE — Progress Notes (Signed)
Physical Therapy Treatment Patient Details Name: Ronnie Hernandez MRN: [id number] DOB: August 12, 1960 Today's Date: 04/21/2017    History of Present Illness Ronnie Hernandez is a 56 y.o. male with medical history significant of morbid obesity, hyperlipidemia, hypertension, type 2 diabetes, gout with recent flare completed a course of prednisone taper about a week ago presented with severe bilateral foot pain, erythema and unable to walk since 10/26 which is 4 days prior to admission.    PT Comments    Pt progressing towards physical therapy goals. He achieved 3/4 stand x1 and 1/2 stand x2. Pt reports he feels improvement today and demonstrates increased independence with mobility. Continue to feel that pt would benefit from a SNF stay until he is able to progress to ambulate at least household distances. Will continue to follow.   Follow Up Recommendations  SNF (at current functional level, must consider SNF; hopeful for quick recovery once gout pain is under control, and that he will be able to dc home with [facility] services)     Equipment Recommendations  Other (comment) (Bariatric RW)    Recommendations for Other Services       Precautions / Restrictions Precautions Precautions: Fall Precaution Comments: bariatric patient 440# Required Braces or Orthoses:  (Pt prefers to wear shoes when feet are on the ground) Restrictions Weight Bearing Restrictions: No    Mobility  Bed Mobility Overal bed mobility: Needs Assistance Bed Mobility: Supine to Sit;Rolling;Sit to Sidelying Rolling: Supervision   Supine to sit: Min assist;HOB elevated   Sit to sidelying: Supervision (assist for BLE elavation into bed) General bed mobility comments: Pt is able to scoot to the EOB with use of rail and no assist from therapist, in trendelenburg pt is able to pull himself up.   Transfers Overall transfer level: Needs assistance   Transfers: Sit to/from Stand Sit to Stand: +2 physical assistance;+2  safety/equipment;Min assist         General transfer comment: Sit<>stand x3. Turned recliner chair around so pt could pull to stand from the handles at the back of the chair. He tolerated this well and was able to dmeonstrate 3 stands, holding up to 20 seconds each time. Continues to have difficulty achieving full knee and hip extension.   Ambulation/Gait                 Stairs            Wheelchair Mobility    Modified Rankin (Stroke Patients Only)       Balance Overall balance assessment: Needs assistance Sitting-balance support: No upper extremity supported;Feet supported Sitting balance-Leahy Scale: Good Sitting balance - Comments: sitting EOB with no back support   Standing balance support: Bilateral upper extremity supported Standing balance-Leahy Scale: Zero (approaching Poor) Standing balance comment: reliant on external supports                            Cognition Arousal/Alertness: Awake/alert Behavior During Therapy: WFL for tasks assessed/performed Overall Cognitive Status: Within Functional Limits for tasks assessed                                        Exercises      General Comments        Pertinent Vitals/Pain Pain Assessment: Faces Faces Pain Scale: Hurts a little bit Pain Location: Bil Feet and R knee  during standing  Pain Descriptors / Indicators: Discomfort;Sore;Tender Pain Intervention(s): Monitored during session    Home Living                      Prior Function            PT Goals (current goals can now be found in the care plan section) Acute Rehab PT Goals Patient Stated Goal: To go home if possible PT Goal Formulation: With patient Time For Goal Achievement: 05/04/17 Potential to Achieve Goals: Fair Progress towards PT goals: Progressing toward goals    Frequency    Min 3X/week      PT Plan Current plan remains appropriate    Co-evaluation PT/OT/SLP  Co-Evaluation/Treatment: Yes            AM-PAC PT "6 Clicks" Daily Activity  Outcome Measure  Difficulty turning over in bed (including adjusting bedclothes, sheets and blankets)?: Unable Difficulty moving from lying on back to sitting on the side of the bed? : Unable Difficulty sitting down on and standing up from a chair with arms (e.g., wheelchair, bedside commode, etc,.)?: Unable Help needed moving to and from a bed to chair (including a wheelchair)?: Total Help needed walking in hospital room?: Total Help needed climbing 3-5 steps with a railing? : Total 6 Click Score: 6    End of Session   Activity Tolerance: Patient limited by pain Patient left: in bed;with call bell/phone within reach Nurse Communication: Mobility status PT Visit Diagnosis: Other abnormalities of gait and mobility (R26.89);Pain Pain - Right/Left:  (Bilateral) Pain - part of body: Ankle and joints of foot;Knee (R knee)     Time: 3646-8032 PT Time Calculation (min) (ACUTE ONLY): 27 min  Charges:  $Gait Training: 23-37 mins                    G Codes:       Rolinda Roan, PT, DPT Acute Rehabilitation Services Pager: [phone]    Thelma Comp 04/21/2017, 11:41 AM

## 2017-04-21 NOTE — Progress Notes (Signed)
Results for NAJEEB, UPTAIN (MRN [id number]) as of 04/21/2017 11:47  Ref. Range 04/20/2017 07:44 04/20/2017 11:38 04/20/2017 16:41 04/20/2017 21:47 04/21/2017 08:01  Glucose-Capillary Latest Ref Range: 65 - 99 mg/dL 290 (H) 317 (H) 316 (H) 267 (H) 246 (H)  Noted that blood sugars continue to be greater than 180 mg/dl.  Recommend increasing Lantus to 25-30 units daily (0.15 units/kg X 201 kg= 30.15 units) and increasing Novolog correction scale to RESISTANT TID & HS if blood sugars continue to be elevated.   Harvel Ricks RN BSN CDE Diabetes Coordinator Pager: [phone]  8am-5pm

## 2017-04-21 NOTE — Progress Notes (Signed)
PROGRESS NOTE  Ronnie Hernandez [id number]:[id number] DOB: [birth year] DOA: 04/18/2017 PCP: [name], NP  HPI/Recap of past 79 hours: 56 year old male with medical history significant for morbid obesity, hyperlipidemia, hypertension, diabetes, severe gout presents with worsening bilateral foot pain, erythema and inability to walk for the past 4 days.  Patient is currently being managed for recurrent gout flareup despite completing a course of prednisone taper about a week ago prior to admission.  Today, patient reports bilateral feet pain has significantly improved although is still unable to take multiple steps.  Reports also right knee pain relieved somewhat by diclofenac gel.  Patient denied any other symptoms, denied chest pain, shortness of breath, abdominal pain, nausea/vomiting, diarrhea, fever/chills.  Patient was started on IV Solu-Medrol and pain management  Assessment/Plan: Active Problems:   Diabetes type 2, uncontrolled (HCC)   Acute gouty arthritis   Acute gout  #Acute gout flare refractory to outpatient oral medications Improving, history of severe gout, tophi noted Afebrile, with mild leukocytosis likely from steroid use Uric acid level within normal limits X-ray of right foot shows severe degenerative changes involving the right first and fifth MTP joints, gouty changes cannot be excluded.  X-ray of the left foot shows diffuse osteopenia and degenerative changes.  No fracture or dislocation noted IV Solu-Medrol, continue to taper down Continue allopurinol, colchicine Motrin as needed, with Protonix prophylaxis PT/OT on board Daily CBC  #Right knee pain X-ray showed severe osteoarthritis Secondary to morbid obesity Topical diclofenac on the right knee Follow-up with PCP  #Type 2 diabetes with hyperglycemia A1c 6 Hypoglycemia, steroid-induced SSI, Lantus 15 units  #Hypertension Continue lisinopril, Lasix  #Morbid obesity Lifestyle modification  advised      Code Status: Full  Family Communication: None at bedside  Disposition Plan: Home once stable   Consultants:  None  Procedures:  None  Antimicrobials:  None  DVT prophylaxis: Lovenox   Objective: Vitals:   04/20/17 2145 04/21/17 0515 04/21/17 0910 04/21/17 1722  BP: (!) 128/56 (!) 148/72 (!) 143/91 (!) 149/45  Pulse: 79 69 87 92  Resp: 18 17 20 16   Temp: 98 F (36.7 C) 97.8 F (36.6 C) (!) 97.5 F (36.4 C) 97.7 F (36.5 C)  TempSrc: Oral Oral Oral Oral  SpO2: 100% 98% 97% 97%  Weight: (!) 201.9 kg (445 lb)     Height:        Intake/Output Summary (Last 24 hours) at 04/21/17 1810 Last data filed at 04/21/17 1803  Gross per 24 hour  Intake              600 ml  Output             2401 ml  Net            -1801 ml   Filed Weights   04/19/17 1728 04/19/17 2040 04/20/17 2145  Weight: (!) 200 kg (441 lb) (!) 200 kg (440 lb 14.7 oz) (!) 201.9 kg (445 lb)    Exam:   General: Alert, awake, oriented x3  Cardiovascular: S1-S2 present no added heart sounds  Respiratory: Chest clear bilaterally  Abdomen: Soft, obese, nondistended, no sounds present  Musculoskeletal: Bilateral feet swelling, chronic venous stasis noted bilateral lower extremity  Skin: Normal  Psychiatry: Normal mood   Data Reviewed: CBC:  Recent Labs Lab 04/18/17 1535 04/19/17 0445 04/21/17 0807  WBC 14.0* 13.8* 14.4*  NEUTROABS 10.3*  --  12.4*  HGB 13.3 15.2 14.5  HCT 39.4 45.5 43.2  MCV  93.8 93.8 93.9  PLT 146* 168 829   Basic Metabolic Panel:  Recent Labs Lab 04/18/17 1535 04/21/17 0807  NA 134* 136  K 3.9 5.0  CL 99* 103  CO2 25 26  GLUCOSE 121* 263*  BUN 15 26*  CREATININE 1.13 1.00  CALCIUM 8.4* 8.6*   GFR: Estimated Creatinine Clearance: 138.8 mL/min (by C-G formula based on SCr of 1 mg/dL). Liver Function Tests: No results for input(s): AST, ALT, ALKPHOS, BILITOT, PROT, ALBUMIN in the last 168 hours. No results for input(s): LIPASE,  AMYLASE in the last 168 hours. No results for input(s): AMMONIA in the last 168 hours. Coagulation Profile: No results for input(s): INR, PROTIME in the last 168 hours. Cardiac Enzymes: No results for input(s): CKTOTAL, CKMB, CKMBINDEX, TROPONINI in the last 168 hours. BNP (last 3 results) No results for input(s): PROBNP in the last 8760 hours. HbA1C:  Recent Labs  04/18/17 1927  HGBA1C 6.0*   CBG:  Recent Labs Lab 04/20/17 1641 04/20/17 2147 04/21/17 0801 04/21/17 1155 04/21/17 1657  GLUCAP 316* 267* 246* 211* 182*   Lipid Profile: No results for input(s): CHOL, HDL, LDLCALC, TRIG, CHOLHDL, LDLDIRECT in the last 72 hours. Thyroid Function Tests: No results for input(s): TSH, T4TOTAL, FREET4, T3FREE, THYROIDAB in the last 72 hours. Anemia Panel: No results for input(s): VITAMINB12, FOLATE, FERRITIN, TIBC, IRON, RETICCTPCT in the last 72 hours. Urine analysis:    Component Value Date/Time   COLORURINE YELLOW 04/18/2017 1129   APPEARANCEUR CLEAR 04/18/2017 1129   APPEARANCEUR Clear 12/05/2015 1226   LABSPEC 1.004 (L) 04/18/2017 1129   PHURINE 6.0 04/18/2017 1129   GLUCOSEU NEGATIVE 04/18/2017 1129   HGBUR NEGATIVE 04/18/2017 [place] 04/18/2017 1129   BILIRUBINUR Negative 12/05/2015 1226   KETONESUR NEGATIVE 04/18/2017 1129   PROTEINUR NEGATIVE 04/18/2017 1129   UROBILINOGEN 0.2 12/28/2013 1117   NITRITE NEGATIVE 04/18/2017 1129   LEUKOCYTESUR NEGATIVE 04/18/2017 1129   LEUKOCYTESUR Negative 12/05/2015 1226   Sepsis Labs: @LABRCNTIP (procalcitonin:4,lacticidven:4)  )No results found for this or any previous visit (from the past 240 hour(s)).    Studies: Dg Foot Complete Left  Result Date: 04/21/2017 CLINICAL DATA:  History of morbid obesity, hyperlipidemia, hypertension, diabetes, gout. Foot pain. EXAM: LEFT FOOT - COMPLETE 3+ VIEW COMPARISON:  07/06/2016. FINDINGS: Diffuse osteopenia and degenerative change. Periarticular lucencies noted  about the metatarsophalangeal joints are less marked on today's exam. No new focal erosive abnormality identified. Subluxations of the metatarsophalangeal joints are noted. Midfoot bony fusion again noted. Stable calcaneal spur. No acute bony abnormality . IMPRESSION: 1. Diffuse osteopenia and degenerative change. Periarticular lucencies noted about the metatarsophalangeal joints on prior study of 07/06/2016 are less marked on today's exam. No new focal erosive abnormality identified. No fracture or dislocation. 2. Subluxations of the metatarsophalangeal joints are noted. Midfoot bony fusion again noted. Stable calcaneal spur . Electronically Signed   By: Marcello Moores  Register   On: 04/21/2017 09:38   Dg Foot Complete Right  Result Date: 04/21/2017 CLINICAL DATA:  History of severe gout with worsening bilateral foot pain, morbid obesity EXAM: RIGHT FOOT COMPLETE - 3+ VIEW COMPARISON:  None. FINDINGS: There is marked abnormality of the right first MTP joint with loss of joint space, sclerosis, and spurring present. These findings months are most typical of severe degenerative arthritis but superimposed gout cannot be excluded. Degenerative change of the right fifth MTP joint also is noted. There is degenerative change at spiculation of the tarsals and metatarsals as well. The majority  of these changes do appear to be degenerative although some superimposed changes of gout cannot be excluded. IMPRESSION: Primarily severe degenerative changes involve the right first and fifth MTP joints as well as the tarsal metatarsal articulations. Superimposed gouty changes cannot be excluded. Electronically Signed   By: Ivar Drape M.D.   On: 04/21/2017 09:39    Scheduled Meds: . allopurinol  300 mg Oral Daily  . colchicine  0.6 mg Oral BID  . diclofenac sodium  2 g Topical TID  . enoxaparin (LOVENOX) injection  100 mg Subcutaneous Daily  . furosemide  20 mg Intravenous Daily  . insulin aspart  0-5 Units Subcutaneous QHS   . insulin aspart  0-9 Units Subcutaneous TID WC  . insulin glargine  15 Units Subcutaneous Daily  . lisinopril  2.5 mg Oral Daily  . methylPREDNISolone (SOLU-MEDROL) injection  60 mg Intravenous Q12H  . pantoprazole  40 mg Oral Daily  . polyethylene glycol  17 g Oral Daily    Continuous Infusions:   LOS: 0 days     Alma Friendly, MD [facility]   If 7PM-7AM, please contact night-coverage [email or web address] Password [email or web address] 04/21/2017, 6:10 PM

## 2017-04-22 LAB — CBC WITH DIFFERENTIAL/PLATELET
BASOS PCT: 0 %
Basophils Absolute: 0 10*3/uL (ref 0.0–0.1)
EOS ABS: 0 10*3/uL (ref 0.0–0.7)
EOS PCT: 0 %
HCT: 43.3 % (ref 39.0–52.0)
Hemoglobin: 14.3 g/dL (ref 13.0–17.0)
LYMPHS ABS: 1.3 10*3/uL (ref 0.7–4.0)
Lymphocytes Relative: 16 %
MCH: 31.4 pg (ref 26.0–34.0)
MCHC: 33 g/dL (ref 30.0–36.0)
MCV: 95 fL (ref 78.0–100.0)
MONOS PCT: 3 %
Monocytes Absolute: 0.2 10*3/uL (ref 0.1–1.0)
NEUTROS PCT: 81 %
Neutro Abs: 6.4 10*3/uL (ref 1.7–7.7)
PLATELETS: 257 10*3/uL (ref 150–400)
RBC: 4.56 MIL/uL (ref 4.22–5.81)
RDW: 13.6 % (ref 11.5–15.5)
WBC: 7.9 10*3/uL (ref 4.0–10.5)

## 2017-04-22 LAB — BASIC METABOLIC PANEL
Anion gap: 7 (ref 5–15)
BUN: 32 mg/dL — AB (ref 6–20)
CALCIUM: 8.4 mg/dL — AB (ref 8.9–10.3)
CHLORIDE: 103 mmol/L (ref 101–111)
CO2: 26 mmol/L (ref 22–32)
Creatinine, Ser: 1.13 mg/dL (ref 0.61–1.24)
GFR calc non Af Amer: >60 mL/min (ref >60)
Glucose, Bld: 272 mg/dL — ABNORMAL HIGH (ref 65–99)
Potassium: 5.1 mmol/L (ref 3.5–5.1)
SODIUM: 136 mmol/L (ref 135–145)

## 2017-04-22 LAB — GLUCOSE, CAPILLARY
GLUCOSE-CAPILLARY: 206 mg/dL — AB (ref 65–99)
GLUCOSE-CAPILLARY: 215 mg/dL — AB (ref 65–99)
Glucose-Capillary: 201 mg/dL — ABNORMAL HIGH (ref 65–99)
Glucose-Capillary: 222 mg/dL — ABNORMAL HIGH (ref 65–99)

## 2017-04-22 MED ORDER — PREDNISONE 20 MG PO TABS
40.0000 mg | ORAL_TABLET | Freq: Every day | ORAL | Status: DC
  Administered 2017-04-23: 40 mg via ORAL
  Filled 2017-04-22: qty 2

## 2017-04-22 NOTE — Progress Notes (Signed)
PROGRESS NOTE  Ronnie Hernandez KDT:[id number] DOB: 1961/01/09 DOA: 04/18/2017 PCP: [name], NP  HPI/Recap of past 31 hours: 56 year old male with history significant for morbid obesity, hyperlipidemia, hypertension, diabetes, severe gout presents with worsening bilateral foot pain, erythema and inability to walk for the past 4 days.  Patient is currently being managed for recurrent gout flareup despite completing a course of prednisone taper about a week ago prior to admission  Today, patient reports significant improvement although is still unable to take multiple steps.  Denies any other complaints  Assessment/Plan: Active Problems:   Diabetes type 2, uncontrolled (HCC)   Acute gouty arthritis   Acute gout  #Acute gout flare refractory to outpatient oral medication Improving Afebrile, with mild leukocytosis likely from steroid use X-ray of feet show severe degenerative changes IV Solu-Medrol taper down Continue allopurinol, colchicine Motrin as needed,  PT/OT on board  #Right knee pain X-rays showed severe osteoarthritis Secondary to morbid obesity Topical diclofenac on the right knee Follow-up with PCP  #Type 2 diabetes with hyperglycemia A1c 6 Hyperglycemia, steroid-induced SSI, Lantus 15 units   #Hypertension Continue lisinopril, Lasix  #Morbid obesity Lifestyle modification advised      Code Status: Full  Family Communication: None at bedside  Disposition Plan: Home once stable   Consultants:  None   Procedures:  None  Antimicrobials:  None  DVT prophylaxis:  Lovenox   Objective: Vitals:   04/22/17 0627 04/22/17 1016 04/22/17 1559 04/22/17 2050  BP: (!) 149/78 138/69 (!) 145/62 (!) 144/69  Pulse: 78 72 78 74  Resp: 18 18 20 20   Temp: 97.7 F (36.5 C) 97.9 F (36.6 C) 97.7 F (36.5 C) 98.3 F (36.8 C)  TempSrc: Oral Oral Oral Oral  SpO2: 95% 98% 98% 95%  Weight:      Height:        Intake/Output Summary (Last 24  hours) at 04/22/17 2237 Last data filed at 04/22/17 2226  Gross per 24 hour  Intake              600 ml  Output              851 ml  Net             -251 ml   Filed Weights   04/19/17 1728 04/19/17 2040 04/20/17 2145  Weight: (!) 200 kg (441 lb) (!) 200 kg (440 lb 14.7 oz) (!) 201.9 kg (445 lb)    Exam:   General: Alert, awake, oriented x3  Cardiovascular: S1-S2 present no added heart sounds  Respiratory: Chest clear bilaterally  Abdomen: Soft, obese, nondistended, nontender   Musculoskeletal: Bilateral feet swelling, chronic venous stasis noted bilaterally  Skin: Normal  Psychiatry: Normal mood   Data Reviewed: CBC:  Recent Labs Lab 04/18/17 1535 04/19/17 0445 04/21/17 0807 04/22/17 0317  WBC 14.0* 13.8* 14.4* 7.9  NEUTROABS 10.3*  --  12.4* 6.4  HGB 13.3 15.2 14.5 14.3  HCT 39.4 45.5 43.2 43.3  MCV 93.8 93.8 93.9 95.0  PLT 146* 168 241 998   Basic Metabolic Panel:  Recent Labs Lab 04/18/17 1535 04/21/17 0807 04/22/17 0317  NA 134* 136 136  K 3.9 5.0 5.1  CL 99* 103 103  CO2 25 26 26   GLUCOSE 121* 263* 272*  BUN 15 26* 32*  CREATININE 1.13 1.00 1.13  CALCIUM 8.4* 8.6* 8.4*   GFR: Estimated Creatinine Clearance: 122.8 mL/min (by C-G formula based on SCr of 1.13 mg/dL). Liver Function Tests: No  results for input(s): AST, ALT, ALKPHOS, BILITOT, PROT, ALBUMIN in the last 168 hours. No results for input(s): LIPASE, AMYLASE in the last 168 hours. No results for input(s): AMMONIA in the last 168 hours. Coagulation Profile: No results for input(s): INR, PROTIME in the last 168 hours. Cardiac Enzymes: No results for input(s): CKTOTAL, CKMB, CKMBINDEX, TROPONINI in the last 168 hours. BNP (last 3 results) No results for input(s): PROBNP in the last 8760 hours. HbA1C: No results for input(s): HGBA1C in the last 72 hours. CBG:  Recent Labs Lab 04/21/17 2152 04/22/17 0741 04/22/17 1134 04/22/17 1656 04/22/17 2046  GLUCAP 226* 206* 215* 201*  222*   Lipid Profile: No results for input(s): CHOL, HDL, LDLCALC, TRIG, CHOLHDL, LDLDIRECT in the last 72 hours. Thyroid Function Tests: No results for input(s): TSH, T4TOTAL, FREET4, T3FREE, THYROIDAB in the last 72 hours. Anemia Panel: No results for input(s): VITAMINB12, FOLATE, FERRITIN, TIBC, IRON, RETICCTPCT in the last 72 hours. Urine analysis:    Component Value Date/Time   COLORURINE YELLOW 04/18/2017 1129   APPEARANCEUR CLEAR 04/18/2017 1129   APPEARANCEUR Clear 12/05/2015 1226   LABSPEC 1.004 (L) 04/18/2017 1129   PHURINE 6.0 04/18/2017 1129   GLUCOSEU NEGATIVE 04/18/2017 1129   HGBUR NEGATIVE 04/18/2017 [place] 04/18/2017 1129   BILIRUBINUR Negative 12/05/2015 1226   KETONESUR NEGATIVE 04/18/2017 1129   PROTEINUR NEGATIVE 04/18/2017 1129   UROBILINOGEN 0.2 12/28/2013 1117   NITRITE NEGATIVE 04/18/2017 1129   LEUKOCYTESUR NEGATIVE 04/18/2017 1129   LEUKOCYTESUR Negative 12/05/2015 1226   Sepsis Labs: @LABRCNTIP (procalcitonin:4,lacticidven:4)  )No results found for this or any previous visit (from the past 240 hour(s)).    Studies: No results found.  Scheduled Meds: . allopurinol  300 mg Oral Daily  . colchicine  0.6 mg Oral BID  . diclofenac sodium  2 g Topical TID  . enoxaparin (LOVENOX) injection  100 mg Subcutaneous Daily  . furosemide  20 mg Intravenous Daily  . insulin aspart  0-5 Units Subcutaneous QHS  . insulin aspart  0-9 Units Subcutaneous TID WC  . insulin glargine  15 Units Subcutaneous Daily  . lisinopril  2.5 mg Oral Daily  . methylPREDNISolone (SOLU-MEDROL) injection  40 mg Intravenous Q12H  . pantoprazole  40 mg Oral Daily  . polyethylene glycol  17 g Oral Daily    Continuous Infusions:   LOS: 1 day     Alma Friendly, MD [facility]   If 7PM-7AM, please contact night-coverage [email or web address] Password [facility] 04/22/2017, 10:37 PM

## 2017-04-23 LAB — BASIC METABOLIC PANEL
Anion gap: 7 (ref 5–15)
BUN: 29 mg/dL — AB (ref 6–20)
CHLORIDE: 100 mmol/L — AB (ref 101–111)
CO2: 27 mmol/L (ref 22–32)
CREATININE: 1.13 mg/dL (ref 0.61–1.24)
Calcium: 8.5 mg/dL — ABNORMAL LOW (ref 8.9–10.3)
GFR calc Af Amer: >60 mL/min (ref >60)
GFR calc non Af Amer: >60 mL/min (ref >60)
GLUCOSE: 203 mg/dL — AB (ref 65–99)
Potassium: 5 mmol/L (ref 3.5–5.1)
SODIUM: 134 mmol/L — AB (ref 135–145)

## 2017-04-23 LAB — GLUCOSE, CAPILLARY
GLUCOSE-CAPILLARY: 169 mg/dL — AB (ref 65–99)
GLUCOSE-CAPILLARY: 208 mg/dL — AB (ref 65–99)
GLUCOSE-CAPILLARY: 242 mg/dL — AB (ref 65–99)
GLUCOSE-CAPILLARY: 137 mg/dL — AB (ref 65–99)

## 2017-04-23 MED ORDER — PREDNISONE 20 MG PO TABS
20.0000 mg | ORAL_TABLET | Freq: Every day | ORAL | Status: DC
  Administered 2017-04-24: 20 mg via ORAL
  Filled 2017-04-23: qty 1

## 2017-04-23 NOTE — Progress Notes (Signed)
PROGRESS NOTE  Ronnie Hernandez [id number]:[id number] DOB: 1960-12-02 DOA: 04/18/2017 PCP: [name], NP  HPI/Recap of past 41 hours: 56 year old male with history significant for morbid obesity, hyperlipidemia, hypertension, diabetes, severe gout presents with worsening bilateral foot pain, erythema and inability to walk for the past 4 days.  Patient is currently being managed for recurrent gout flareup despite completing a course of prednisone taper about a week ago prior to admission.  Today, patient reports significant improvement although is still unable to take multiple steps.  PT/OT on board, recommend SNF, patient insists on going home.  Denies any chest pain, worsening shortness of breath, abdominal pain, nausea/vomiting, fever/chills.  Assessment/Plan: Active Problems:   Diabetes type 2, uncontrolled (HCC)   Acute gouty arthritis   Acute gout  #Acute gout flare refractory to outpatient oral medication Improving Afebrile X-ray of feet showed severe degenerative changes Tapered prednisone to 20 mg p.o. Daily Continue allopurinol, colchicine, Motrin as needed PT/OT on board  #Right knee pain X-rays showed severe osteoarthritis Topical diclofenac on the right knee Follow-up with PCP  #Type 2 diabetes with hyperglycemia A1c 6 Hyperglycemia, steroid-induced SSI, Lantus 15 units  #Hypertension Continue lisinopril, Lasix  #Morbid obesity Lifestyle modification advised   Code Status: Full  Family Communication: None at bedside  Disposition Plan: Home once stable   Consultants:  None  Procedures:  None  Antimicrobials:  None  DVT prophylaxis: Lovenox   Objective: Vitals:   04/22/17 1559 04/22/17 2050 04/23/17 0457 04/23/17 0935  BP: (!) 145/62 (!) 144/69 (!) 146/72 (!) 138/55  Pulse: 78 74 69 78  Resp: 20 20 16 17   Temp: 97.7 F (36.5 C) 98.3 F (36.8 C) 97.8 F (36.6 C) 97.7 F (36.5 C)  TempSrc: Oral Oral Oral Oral  SpO2: 98% 95% 99%  98%  Weight:      Height:        Intake/Output Summary (Last 24 hours) at 04/23/17 1735 Last data filed at 04/23/17 1717  Gross per 24 hour  Intake              940 ml  Output             3275 ml  Net            -2335 ml   Filed Weights   04/19/17 1728 04/19/17 2040 04/20/17 2145  Weight: (!) 200 kg (441 lb) (!) 200 kg (440 lb 14.7 oz) (!) 201.9 kg (445 lb)    Exam:   General: Alert, awake, oriented x3  Cardiovascular: S1-S2 present, no added heart sounds  Respiratory: Chest clear bilaterally  Abdomen: Soft, obese, nondistended, nontender  Musculoskeletal: Bilateral feet swelling, chronic venous stasis bilaterally  Skin: Normal  Psychiatry: Pleasant   Data Reviewed: CBC:  Recent Labs Lab 04/18/17 1535 04/19/17 0445 04/21/17 0807 04/22/17 0317  WBC 14.0* 13.8* 14.4* 7.9  NEUTROABS 10.3*  --  12.4* 6.4  HGB 13.3 15.2 14.5 14.3  HCT 39.4 45.5 43.2 43.3  MCV 93.8 93.8 93.9 95.0  PLT 146* 168 241 834   Basic Metabolic Panel:  Recent Labs Lab 04/18/17 1535 04/21/17 0807 04/22/17 0317 04/23/17 0443  NA 134* 136 136 134*  K 3.9 5.0 5.1 5.0  CL 99* 103 103 100*  CO2 25 26 26 27   GLUCOSE 121* 263* 272* 203*  BUN 15 26* 32* 29*  CREATININE 1.13 1.00 1.13 1.13  CALCIUM 8.4* 8.6* 8.4* 8.5*   GFR: Estimated Creatinine Clearance: 122.8 mL/min (by C-G formula  based on SCr of 1.13 mg/dL). Liver Function Tests: No results for input(s): AST, ALT, ALKPHOS, BILITOT, PROT, ALBUMIN in the last 168 hours. No results for input(s): LIPASE, AMYLASE in the last 168 hours. No results for input(s): AMMONIA in the last 168 hours. Coagulation Profile: No results for input(s): INR, PROTIME in the last 168 hours. Cardiac Enzymes: No results for input(s): CKTOTAL, CKMB, CKMBINDEX, TROPONINI in the last 168 hours. BNP (last 3 results) No results for input(s): PROBNP in the last 8760 hours. HbA1C: No results for input(s): HGBA1C in the last 72 hours. CBG:  Recent  Labs Lab 04/22/17 1656 04/22/17 2046 04/23/17 0820 04/23/17 1211 04/23/17 1626  GLUCAP 201* 222* 169* 208* 242*   Lipid Profile: No results for input(s): CHOL, HDL, LDLCALC, TRIG, CHOLHDL, LDLDIRECT in the last 72 hours. Thyroid Function Tests: No results for input(s): TSH, T4TOTAL, FREET4, T3FREE, THYROIDAB in the last 72 hours. Anemia Panel: No results for input(s): VITAMINB12, FOLATE, FERRITIN, TIBC, IRON, RETICCTPCT in the last 72 hours. Urine analysis:    Component Value Date/Time   COLORURINE YELLOW 04/18/2017 1129   APPEARANCEUR CLEAR 04/18/2017 1129   APPEARANCEUR Clear 12/05/2015 1226   LABSPEC 1.004 (L) 04/18/2017 1129   PHURINE 6.0 04/18/2017 1129   GLUCOSEU NEGATIVE 04/18/2017 1129   HGBUR NEGATIVE 04/18/2017 [place] 04/18/2017 1129   BILIRUBINUR Negative 12/05/2015 1226   KETONESUR NEGATIVE 04/18/2017 1129   PROTEINUR NEGATIVE 04/18/2017 1129   UROBILINOGEN 0.2 12/28/2013 1117   NITRITE NEGATIVE 04/18/2017 1129   LEUKOCYTESUR NEGATIVE 04/18/2017 1129   LEUKOCYTESUR Negative 12/05/2015 1226   Sepsis Labs: @LABRCNTIP (procalcitonin:4,lacticidven:4)  )No results found for this or any previous visit (from the past 240 hour(s)).    Studies: No results found.  Scheduled Meds: . allopurinol  300 mg Oral Daily  . colchicine  0.6 mg Oral BID  . diclofenac sodium  2 g Topical TID  . enoxaparin (LOVENOX) injection  100 mg Subcutaneous Daily  . furosemide  20 mg Intravenous Daily  . insulin aspart  0-5 Units Subcutaneous QHS  . insulin aspart  0-9 Units Subcutaneous TID WC  . insulin glargine  15 Units Subcutaneous Daily  . lisinopril  2.5 mg Oral Daily  . pantoprazole  40 mg Oral Daily  . polyethylene glycol  17 g Oral Daily  . [START ON 04/24/2017] predniSONE  20 mg Oral Q breakfast    Continuous Infusions:   LOS: 2 days     Alma Friendly, MD [facility]  If 7PM-7AM, please contact  night-coverage [email or web address] Password [email or web address] 04/23/2017, 5:35 PM

## 2017-04-24 ENCOUNTER — Other Ambulatory Visit: Payer: Self-pay

## 2017-04-24 LAB — GLUCOSE, CAPILLARY
GLUCOSE-CAPILLARY: 131 mg/dL — AB (ref 65–99)
GLUCOSE-CAPILLARY: 150 mg/dL — AB (ref 65–99)
GLUCOSE-CAPILLARY: 155 mg/dL — AB (ref 65–99)
Glucose-Capillary: 137 mg/dL — ABNORMAL HIGH (ref 65–99)

## 2017-04-24 MED ORDER — PREDNISONE 10 MG PO TABS
10.0000 mg | ORAL_TABLET | Freq: Every day | ORAL | Status: DC
  Administered 2017-04-25 – 2017-04-28 (×4): 10 mg via ORAL
  Filled 2017-04-24 (×4): qty 1

## 2017-04-24 NOTE — Progress Notes (Signed)
PROGRESS NOTE  Ronnie Hernandez KZS:[id number] DOB: 1961-06-20 DOA: 04/18/2017 PCP: [name], NP  HPI/Recap of past 51 hours: 56 year old male with history significant for morbid obesity, hyperlipidemia, hypertension, diabetes, severe gout presents with worsening bilateral foot pain, erythema and inability to walk for the past 4 days.  Patient is currently being managed for recurrent gout flareup despite completing a course of prednisone taper about a week ago prior to admission.  Today, patient reports significant improvement in terms of pain athough unsure if hes going to be able to walk as PT/OT didn't see him over the weekend. PT/OT recommend SNF, patient insists on going home.  Denies any chest pain, worsening shortness of breath, abdominal pain, nausea/vomiting, fever/chills.  Assessment/Plan: Active Problems:   Diabetes type 2, uncontrolled (HCC)   Acute gouty arthritis   Acute gout  #Acute gout flare refractory to outpatient oral medication Improving Afebrile X-ray of feet showed severe degenerative changes Tapered prednisone to 10 mg p.o. Daily Continue allopurinol, colchicine, Motrin as needed PT/OT on board  #Right knee pain X-rays showed severe osteoarthritis Topical diclofenac on the right knee Follow-up with PCP  #Type 2 diabetes with hyperglycemia A1c 6 Hyperglycemia, steroid-induced SSI, Lantus 15 units  #Hypertension Continue lisinopril, Lasix  #Morbid obesity Lifestyle modification advised   Code Status: Full  Family Communication: None at bedside  Disposition Plan: Home once stable   Consultants:  None  Procedures:  None  Antimicrobials:  None  DVT prophylaxis: Lovenox   Objective: Vitals:   04/23/17 2051 04/24/17 0552 04/24/17 1110 04/24/17 1500  BP: 129/67 129/71 131/72 128/77  Pulse: 67 77 78 82  Resp: 20 16 16 18   Temp: 97.7 F (36.5 C) 97.8 F (36.6 C) 97.7 F (36.5 C) 97.7 F (36.5 C)  TempSrc: Oral Oral  Oral Oral  SpO2: 99% 96% 98% 98%  Weight: (!) 204.1 kg (450 lb)     Height: 5\' 7"  (1.702 m)       Intake/Output Summary (Last 24 hours) at 04/24/2017 1846 Last data filed at 04/24/2017 1756 Gross per 24 hour  Intake 598 ml  Output 3725 ml  Net -3127 ml   Filed Weights   04/19/17 2040 04/20/17 2145 04/23/17 2051  Weight: (!) 200 kg (440 lb 14.7 oz) (!) 201.9 kg (445 lb) (!) 204.1 kg (450 lb)    Exam:   General: Alert, awake, oriented x3  Cardiovascular: S1-S2 present, no added heart sounds  Respiratory: Chest clear bilaterally  Abdomen: Soft, obese, nondistended, nontender  Musculoskeletal: Bilateral feet swelling, chronic venous stasis bilaterally  Skin: Normal  Psychiatry: Pleasant   Data Reviewed: CBC: Recent Labs  Lab 04/18/17 1535 04/19/17 0445 04/21/17 0807 04/22/17 0317  WBC 14.0* 13.8* 14.4* 7.9  NEUTROABS 10.3*  --  12.4* 6.4  HGB 13.3 15.2 14.5 14.3  HCT 39.4 45.5 43.2 43.3  MCV 93.8 93.8 93.9 95.0  PLT 146* 168 241 732   Basic Metabolic Panel: Recent Labs  Lab 04/18/17 1535 04/21/17 0807 04/22/17 0317 04/23/17 0443  NA 134* 136 136 134*  K 3.9 5.0 5.1 5.0  CL 99* 103 103 100*  CO2 25 26 26 27   GLUCOSE 121* 263* 272* 203*  BUN 15 26* 32* 29*  CREATININE 1.13 1.00 1.13 1.13  CALCIUM 8.4* 8.6* 8.4* 8.5*   GFR: Estimated Creatinine Clearance: 123.7 mL/min (by C-G formula based on SCr of 1.13 mg/dL). Liver Function Tests: No results for input(s): AST, ALT, ALKPHOS, BILITOT, PROT, ALBUMIN in the last 168  hours. No results for input(s): LIPASE, AMYLASE in the last 168 hours. No results for input(s): AMMONIA in the last 168 hours. Coagulation Profile: No results for input(s): INR, PROTIME in the last 168 hours. Cardiac Enzymes: No results for input(s): CKTOTAL, CKMB, CKMBINDEX, TROPONINI in the last 168 hours. BNP (last 3 results) No results for input(s): PROBNP in the last 8760 hours. HbA1C: No results for input(s): HGBA1C in the last  72 hours. CBG: Recent Labs  Lab 04/23/17 1626 04/23/17 2142 04/24/17 0817 04/24/17 1231 04/24/17 1703  GLUCAP 242* 137* 131* 150* 155*   Lipid Profile: No results for input(s): CHOL, HDL, LDLCALC, TRIG, CHOLHDL, LDLDIRECT in the last 72 hours. Thyroid Function Tests: No results for input(s): TSH, T4TOTAL, FREET4, T3FREE, THYROIDAB in the last 72 hours. Anemia Panel: No results for input(s): VITAMINB12, FOLATE, FERRITIN, TIBC, IRON, RETICCTPCT in the last 72 hours. Urine analysis:    Component Value Date/Time   COLORURINE YELLOW 04/18/2017 1129   APPEARANCEUR CLEAR 04/18/2017 1129   APPEARANCEUR Clear 12/05/2015 1226   LABSPEC 1.004 (L) 04/18/2017 1129   PHURINE 6.0 04/18/2017 1129   GLUCOSEU NEGATIVE 04/18/2017 1129   HGBUR NEGATIVE 04/18/2017 [place] 04/18/2017 1129   BILIRUBINUR Negative 12/05/2015 1226   KETONESUR NEGATIVE 04/18/2017 1129   PROTEINUR NEGATIVE 04/18/2017 1129   UROBILINOGEN 0.2 12/28/2013 1117   NITRITE NEGATIVE 04/18/2017 1129   LEUKOCYTESUR NEGATIVE 04/18/2017 1129   LEUKOCYTESUR Negative 12/05/2015 1226   Sepsis Labs: @LABRCNTIP (procalcitonin:4,lacticidven:4)  )No results found for this or any previous visit (from the past 240 hour(s)).    Studies: No results found.  Scheduled Meds: . allopurinol  300 mg Oral Daily  . colchicine  0.6 mg Oral BID  . diclofenac sodium  2 g Topical TID  . enoxaparin (LOVENOX) injection  100 mg Subcutaneous Daily  . furosemide  20 mg Intravenous Daily  . insulin aspart  0-5 Units Subcutaneous QHS  . insulin aspart  0-9 Units Subcutaneous TID WC  . insulin glargine  15 Units Subcutaneous Daily  . lisinopril  2.5 mg Oral Daily  . pantoprazole  40 mg Oral Daily  . polyethylene glycol  17 g Oral Daily  . [START ON 04/25/2017] predniSONE  10 mg Oral Q breakfast    Continuous Infusions:   LOS: 3 days     Alma Friendly, MD [facility]  If 7PM-7AM, please contact  night-coverage [email or web address] Password [facility] 04/24/2017, 6:46 PM

## 2017-04-25 LAB — GLUCOSE, CAPILLARY
GLUCOSE-CAPILLARY: 126 mg/dL — AB (ref 65–99)
GLUCOSE-CAPILLARY: 139 mg/dL — AB (ref 65–99)
GLUCOSE-CAPILLARY: 153 mg/dL — AB (ref 65–99)
GLUCOSE-CAPILLARY: 147 mg/dL — AB (ref 65–99)

## 2017-04-25 LAB — CBC
HCT: 44.4 % (ref 39.0–52.0)
Hemoglobin: 15.1 g/dL (ref 13.0–17.0)
MCH: 31.7 pg (ref 26.0–34.0)
MCHC: 34 g/dL (ref 30.0–36.0)
MCV: 93.3 fL (ref 78.0–100.0)
PLATELETS: 331 10*3/uL (ref 150–400)
RBC: 4.76 MIL/uL (ref 4.22–5.81)
RDW: 13.7 % (ref 11.5–15.5)
WBC: 13.4 10*3/uL — AB (ref 4.0–10.5)

## 2017-04-25 LAB — CREATININE, SERUM
CREATININE: 1.08 mg/dL (ref 0.61–1.24)
GFR calc Af Amer: >60 mL/min (ref >60)

## 2017-04-25 NOTE — Progress Notes (Signed)
Occupational Therapy Treatment Patient Details Name: Ronnie Hernandez MRN: [id number] DOB: 21-Oct-1960 Today's Date: 04/25/2017    History of present illness Ronnie Hernandez is a 56 y.o. male with medical history significant of morbid obesity, hyperlipidemia, hypertension, type 2 diabetes, gout with recent flare completed a course of prednisone taper about a week ago presented with severe bilateral foot pain, erythema and unable to walk since 10/26 which is 4 days prior to admission.   OT comments  Pt seen in conjunction with PT.  Pt significantly improved today, but still requires assist for bed mobility (min A), and stand pivot transfers (min A +2).  He fatigues quickly with activity and is at very high risk for falls.  He initially was set on discharging home today, however, long discussion had with him re: his risk of falls and injury - he is now agreeable to short term SNF, and would prefer [facility], if it is an option.  Will continue to follow acutely.   Follow Up Recommendations  SNF;Supervision/Assistance - 24 hour    Equipment Recommendations  Wheelchair (measurements OT)    Recommendations for Other Services      Precautions / Restrictions Precautions Precautions: Fall Precaution Comments: bariatric patient 440# Required Braces or Orthoses: (Pt prefers to wear shoes when feet are on the ground) Restrictions Weight Bearing Restrictions: No       Mobility Bed Mobility Overal bed mobility: Needs Assistance Bed Mobility: Supine to Sit     Supine to sit: Min assist     General bed mobility comments: patient able to come to upright at EOb with increased time and effort, patient using self momentum to elevate trunk, min assist for safety to precent LOB back to bed. Reliance on Bed rail to come to EOb  Transfers Overall transfer level: Needs assistance Equipment used: Rolling walker (2 wheeled)(bariatric) Transfers: Sit to/from [facility] Sit to  Stand: +2 physical assistance;+2 safety/equipment;Min assist Stand pivot transfers: +2 physical assistance;+2 safety/equipment;Min assist       General transfer comment: Sit<>stand x3. Turned recliner chair around so pt could pull to stand from the handles at the back of the chair. He tolerated this well and was able to dmeonstrate 3 stands, holding up to 20 seconds each time. Continues to have difficulty achieving full knee and hip extension.     Balance Overall balance assessment: Needs assistance Sitting-balance support: No upper extremity supported;Feet supported Sitting balance-Leahy Scale: Good Sitting balance - Comments: sitting EOB with no back support   Standing balance support: Bilateral upper extremity supported Standing balance-Leahy Scale: Poor Standing balance comment: reliant on external supports                           ADL either performed or assessed with clinical judgement   ADL Overall ADL's : Needs assistance/impaired         Upper Body Bathing: Moderate assistance   Lower Body Bathing: Moderate assistance;Sit to/from stand       Lower Body Dressing: Moderate assistance;Sit to/from stand   Toilet Transfer: +2 for safety/equipment;Stand-pivot;BSC;RW;Minimal assistance Toilet Transfer Details (indicate cue type and reason): Pt requires increased time and effort to pivot.  He demonstrates difficulty moving into trunk extension.  Maintains LEs abducted with feet externally rotated during transfer.  Very close min assist provided  Toileting- Clothing Manipulation and Hygiene: Maximal assistance;Sit to/from stand       Functional mobility during ADLs: +2 for safety/equipment;Rolling walker;Minimal  assistance       Vision       Perception     Praxis      Cognition Arousal/Alertness: Awake/alert Behavior During Therapy: WFL for tasks assessed/performed Overall Cognitive Status: Within Functional Limits for tasks assessed Area of  Impairment: Safety/judgement;Awareness                         Safety/Judgement: Decreased awareness of deficits;Decreased awareness of safety Awareness: Emergent   General Comments: Pt initially with decreased awareness of impact of deficits on ADLs - anticipate this is his baseline         Exercises     Shoulder Instructions       General Comments Long discussion with pt re: safety at home and realistic expectations as well as therapy recommendations for SNF level rehab     Pertinent Vitals/ Pain       Pain Assessment: Faces Faces Pain Scale: Hurts even more Pain Location: bilateral feet and LEs with movement Pain Descriptors / Indicators: Discomfort;Grimacing Pain Intervention(s): Monitored during session  Home Living                                          Prior Functioning/Environment              Frequency  Min 2X/week        Progress Toward Goals  OT Goals(current goals can now be found in the care plan section)  Progress towards OT goals: Progressing toward goals  Acute Rehab OT Goals Patient Stated Goal: to go home  Plan Discharge plan remains appropriate    Co-evaluation    PT/OT/SLP Co-Evaluation/Treatment: Yes Reason for Co-Treatment: To address functional/ADL transfers;For patient/therapist safety PT goals addressed during session: Mobility/safety with mobility OT goals addressed during session: ADL's and self-care      AM-PAC PT "6 Clicks" Daily Activity     Outcome Measure   Help from another person eating meals?: None Help from another person taking care of personal grooming?: A Little Help from another person toileting, which includes using toliet, bedpan, or urinal?: A Lot Help from another person bathing (including washing, rinsing, drying)?: A Lot Help from another person to put on and taking off regular upper body clothing?: A Little Help from another person to put on and taking off regular lower  body clothing?: A Lot 6 Click Score: 16    End of Session Equipment Utilized During Treatment: Rolling walker  OT Visit Diagnosis: Unsteadiness on feet (R26.81);Other abnormalities of gait and mobility (R26.89);Pain Pain - Right/Left: Right Pain - part of body: Knee;Ankle and joints of foot   Activity Tolerance Patient tolerated treatment well   Patient Left in chair;with call bell/phone within reach   Nurse Communication Mobility status        Time: 0102-7253 OT Time Calculation (min): 23 min  Charges: OT General Charges $OT Visit: 1 Visit OT Treatments $Therapeutic Activity: 8-22 mins  [facility], OTR/L [phone]    Lucille Passy M 04/25/2017, 12:36 PM

## 2017-04-25 NOTE — Progress Notes (Signed)
Physical Therapy Treatment Patient Details Name: ABDUL BEIRNE MRN: [id number] DOB: [birth year] Today's Date: 04/25/2017    History of Present Illness BAYDEN GIL is a 56 y.o. male with medical history significant of morbid obesity, hyperlipidemia, hypertension, type 2 diabetes, gout with recent flare completed a course of prednisone taper about a week ago presented with severe bilateral foot pain, erythema and unable to walk since 10/26 which is 4 days prior to admission.       Follow Up Recommendations  SNF     Equipment Recommendations  Wheelchair (measurements PT);Wheelchair cushion (measurements PT);Other (comment)(Bariatric RW)    Recommendations for Other Services       Precautions / Restrictions Precautions Precautions: Fall Precaution Comments: bariatric patient 440# Required Braces or Orthoses: (Pt prefers to wear shoes when feet are on the ground) Restrictions Weight Bearing Restrictions: No    Mobility  Bed Mobility Overal bed mobility: Needs Assistance Bed Mobility: Supine to Sit     Supine to sit: Min assist     General bed mobility comments: patient able to come to upright at EOb with increased time and effort, patient using self momentum to elevate trunk, min assist for safety to precent LOB back to bed. Reliance on Bed rail to come to EOb  Transfers Overall transfer level: Needs assistance Equipment used: Rolling walker (2 wheeled)(bariatric) Transfers: Sit to/from [facility] Sit to Stand: Min assist;+2 physical assistance;+2 safety/equipment(min assist to stabilize RW during power to upright) Stand pivot transfers: +2 physical assistance;+2 safety/equipment;Min assist       General transfer comment: Sit<>stand x2. Min assist to pivot, significant reliance on fwd flexed posture, Increased time to power to upright with UE support. Assist for safety with transitional movements using RW to pivot to chair.   Ambulation/Gait              General Gait Details: unable to perform   Stairs            Wheelchair Mobility    Modified Rankin (Stroke Patients Only)       Balance Overall balance assessment: Needs assistance Sitting-balance support: No upper extremity supported;Feet supported Sitting balance-Leahy Scale: Good Sitting balance - Comments: sitting EOB with no back support   Standing balance support: Bilateral upper extremity supported Standing balance-Leahy Scale: Poor Standing balance comment: reliant on external supports                            Cognition Arousal/Alertness: Awake/alert Behavior During Therapy: WFL for tasks assessed/performed Overall Cognitive Status: Within Functional Limits for tasks assessed Area of Impairment: Safety/judgement;Awareness                         Safety/Judgement: Decreased awareness of deficits;Decreased awareness of safety Awareness: Emergent   General Comments: (P) Pt initially with decreased awareness of impact of deficits on ADLs - anticipate this is his baseline       Exercises      General Comments        Pertinent Vitals/Pain Pain Assessment: (P) Faces Faces Pain Scale: Hurts even more Pain Location: bilateral feet and LEs with movement Pain Descriptors / Indicators: Discomfort;Grimacing Pain Intervention(s): (P) Monitored during session    Home Living                      Prior Function  PT Goals (current goals can now be found in the care plan section) Acute Rehab PT Goals Patient Stated Goal: to go home PT Goal Formulation: With patient Time For Goal Achievement: 05/04/17 Potential to Achieve Goals: Fair Progress towards PT goals: Progressing toward goals    Frequency    Min 3X/week      PT Plan Current plan remains appropriate    Co-evaluation PT/OT/SLP Co-Evaluation/Treatment: Yes Reason for Co-Treatment: (P) To address functional/ADL transfers;For  patient/therapist safety PT goals addressed during session: Mobility/safety with mobility OT goals addressed during session: (P) ADL's and self-care      AM-PAC PT "6 Clicks" Daily Activity  Outcome Measure  Difficulty turning over in bed (including adjusting bedclothes, sheets and blankets)?: Unable Difficulty moving from lying on back to sitting on the side of the bed? : Unable Difficulty sitting down on and standing up from a chair with arms (e.g., wheelchair, bedside commode, etc,.)?: Unable Help needed moving to and from a bed to chair (including a wheelchair)?: Total Help needed walking in hospital room?: Total Help needed climbing 3-5 steps with a railing? : Total 6 Click Score: 6    End of Session   Activity Tolerance: Patient limited by pain Patient left: in chair;with call bell/phone within reach Nurse Communication: Mobility status PT Visit Diagnosis: Other abnormalities of gait and mobility (R26.89);Pain Pain - Right/Left: (Bilateral) Pain - part of body: Ankle and joints of foot;Knee(R knee)     Charges:  $Therapeutic Activity: 8-22 mins                       Alben Deeds, PT DPT  Board Certified Neurologic Specialist [place] 04/25/2017, 12:33 PM

## 2017-04-25 NOTE — Progress Notes (Signed)
PROGRESS NOTE  Ronnie Hernandez ERX:[id number] DOB: 10/30/1960 DOA: 04/18/2017 PCP: [name], NP  HPI/Recap of past 81 hours: 56 year old male with history significant for morbid obesity, hyperlipidemia, hypertension, diabetes, severe gout presents with worsening bilateral foot pain, erythema and inability to walk for the past 4 days.  Patient is currently being managed for recurrent gout flareup despite completing a course of prednisone taper about a week ago prior to admission.  Today, patient reports significant improvement in terms of pain. PT/OT recommend SNF, patient is in agreement. Denies any chest pain, worsening shortness of breath, abdominal pain, nausea/vomiting, fever/chills.  Assessment/Plan: Active Problems:   Diabetes type 2, uncontrolled (HCC)   Acute gouty arthritis   Acute gout  #Acute gout flare refractory to outpatient oral medication Improving Afebrile X-ray of feet showed severe degenerative changes Tapered prednisone to 10 mg p.o. Daily Continue allopurinol, colchicine, Motrin as needed PT/OT on board  #Right knee pain X-rays showed severe osteoarthritis Topical diclofenac on the right knee Follow-up with PCP  #Type 2 diabetes with hyperglycemia A1c 6 Hyperglycemia, steroid-induced SSI, Lantus 15 units  #Hypertension Continue lisinopril, Lasix  #Morbid obesity Lifestyle modification advised   Code Status: Full  Family Communication: None at bedside  Disposition Plan: Home once stable   Consultants:  None  Procedures:  None  Antimicrobials:  None  DVT prophylaxis: Lovenox   Objective: Vitals:   04/24/17 2004 04/25/17 0644 04/25/17 0750 04/25/17 1735  BP: (!) 111/54 (!) 149/80 (!) 152/72 125/61  Pulse: 89 77 71 78  Resp: 17 18 18 18   Temp: 97.9 F (36.6 C) 97.8 F (36.6 C) 97.6 F (36.4 C) 97.8 F (36.6 C)  TempSrc:  Oral Oral Oral  SpO2: 97% 96% 97% 98%  Weight:      Height:        Intake/Output  Summary (Last 24 hours) at 04/25/2017 1940 Last data filed at 04/25/2017 1735 Gross per 24 hour  Intake 960 ml  Output 3000 ml  Net -2040 ml   Filed Weights   04/19/17 2040 04/20/17 2145 04/23/17 2051  Weight: (!) 200 kg (440 lb 14.7 oz) (!) 201.9 kg (445 lb) (!) 204.1 kg (450 lb)    Exam:   General: Alert, awake, oriented x3  Cardiovascular: S1-S2 present, no added heart sounds  Respiratory: Chest clear bilaterally  Abdomen: Soft, obese, nondistended, nontender  Musculoskeletal: Chronic venous stasis bilaterally  Skin: Normal  Psychiatry: Pleasant   Data Reviewed: CBC: Recent Labs  Lab 04/19/17 0445 04/21/17 0807 04/22/17 0317 04/25/17 0458  WBC 13.8* 14.4* 7.9 13.4*  NEUTROABS  --  12.4* 6.4  --   HGB 15.2 14.5 14.3 15.1  HCT 45.5 43.2 43.3 44.4  MCV 93.8 93.9 95.0 93.3  PLT 168 241 257 950   Basic Metabolic Panel: Recent Labs  Lab 04/21/17 0807 04/22/17 0317 04/23/17 0443 04/25/17 0458  NA 136 136 134*  --   K 5.0 5.1 5.0  --   CL 103 103 100*  --   CO2 26 26 27   --   GLUCOSE 263* 272* 203*  --   BUN 26* 32* 29*  --   CREATININE 1.00 1.13 1.13 1.08  CALCIUM 8.6* 8.4* 8.5*  --    GFR: Estimated Creatinine Clearance: 129.5 mL/min (by C-G formula based on SCr of 1.08 mg/dL). Liver Function Tests: No results for input(s): AST, ALT, ALKPHOS, BILITOT, PROT, ALBUMIN in the last 168 hours. No results for input(s): LIPASE, AMYLASE in the last 168  hours. No results for input(s): AMMONIA in the last 168 hours. Coagulation Profile: No results for input(s): INR, PROTIME in the last 168 hours. Cardiac Enzymes: No results for input(s): CKTOTAL, CKMB, CKMBINDEX, TROPONINI in the last 168 hours. BNP (last 3 results) No results for input(s): PROBNP in the last 8760 hours. HbA1C: No results for input(s): HGBA1C in the last 72 hours. CBG: Recent Labs  Lab 04/24/17 1703 04/24/17 2048 04/25/17 0735 04/25/17 1156 04/25/17 1638  GLUCAP 155* 137* 126* 139*  153*   Lipid Profile: No results for input(s): CHOL, HDL, LDLCALC, TRIG, CHOLHDL, LDLDIRECT in the last 72 hours. Thyroid Function Tests: No results for input(s): TSH, T4TOTAL, FREET4, T3FREE, THYROIDAB in the last 72 hours. Anemia Panel: No results for input(s): VITAMINB12, FOLATE, FERRITIN, TIBC, IRON, RETICCTPCT in the last 72 hours. Urine analysis:    Component Value Date/Time   COLORURINE YELLOW 04/18/2017 1129   APPEARANCEUR CLEAR 04/18/2017 1129   APPEARANCEUR Clear 12/05/2015 1226   LABSPEC 1.004 (L) 04/18/2017 1129   PHURINE 6.0 04/18/2017 1129   GLUCOSEU NEGATIVE 04/18/2017 1129   HGBUR NEGATIVE 04/18/2017 [place] 04/18/2017 1129   BILIRUBINUR Negative 12/05/2015 1226   KETONESUR NEGATIVE 04/18/2017 1129   PROTEINUR NEGATIVE 04/18/2017 1129   UROBILINOGEN 0.2 12/28/2013 1117   NITRITE NEGATIVE 04/18/2017 1129   LEUKOCYTESUR NEGATIVE 04/18/2017 1129   LEUKOCYTESUR Negative 12/05/2015 1226   Sepsis Labs: @LABRCNTIP (procalcitonin:4,lacticidven:4)  )No results found for this or any previous visit (from the past 240 hour(s)).    Studies: No results found.  Scheduled Meds: . allopurinol  300 mg Oral Daily  . colchicine  0.6 mg Oral BID  . diclofenac sodium  2 g Topical TID  . enoxaparin (LOVENOX) injection  100 mg Subcutaneous Daily  . furosemide  20 mg Intravenous Daily  . insulin aspart  0-5 Units Subcutaneous QHS  . insulin aspart  0-9 Units Subcutaneous TID WC  . insulin glargine  15 Units Subcutaneous Daily  . lisinopril  2.5 mg Oral Daily  . pantoprazole  40 mg Oral Daily  . polyethylene glycol  17 g Oral Daily  . predniSONE  10 mg Oral Q breakfast    Continuous Infusions:   LOS: 4 days     Alma Friendly, MD [facility]  If 7PM-7AM, please contact night-coverage [email or web address] Password [email or web address] 04/25/2017, 7:40 PM

## 2017-04-25 NOTE — Plan of Care (Deleted)
04/25/17 

## 2017-04-25 NOTE — Care Management Important Message (Signed)
Important Message  Patient Details  Name: Ronnie Hernandez MRN: [id number] Date of Birth: September 17, 1960   Medicare Important Message Given:  Yes    Melia Hopes [place] 04/25/2017, 10:19 AM

## 2017-04-25 NOTE — Progress Notes (Signed)
Physical Therapy Treatment Patient Details Name: Ronnie Hernandez MRN: [id number] DOB: Aug 19, 1960 Today's Date: 04/25/2017    History of Present Illness Ronnie Hernandez is a 56 y.o. male with medical history significant of morbid obesity, hyperlipidemia, hypertension, type 2 diabetes, gout with recent flare completed a course of prednisone taper about a week ago presented with severe bilateral foot pain, erythema and unable to walk since 10/26 which is 4 days prior to admission.    PT Comments    Patient seen for OOB activity, patient continues to show limitation sin function and ability to perform basic tasks. Patient is making progress but continue to feel SNF remains appropriate. After discussion of concerns with patient, patient in agreement. Recommend ST SNF.   Follow Up Recommendations  SNF     Equipment Recommendations  Wheelchair (measurements PT);Wheelchair cushion (measurements PT);Other (comment)(Bariatric RW)    Recommendations for Other Services       Precautions / Restrictions Precautions Precautions: Fall Precaution Comments: bariatric patient 440# Required Braces or Orthoses: (Pt prefers to wear shoes when feet are on the ground) Restrictions Weight Bearing Restrictions: No    Mobility  Bed Mobility Overal bed mobility: Needs Assistance Bed Mobility: Supine to Sit     Supine to sit: Min assist     General bed mobility comments: patient able to come to upright at EOb with increased time and effort, patient using self momentum to elevate trunk, min assist for safety to precent LOB back to bed. Reliance on Bed rail to come to EOb  Transfers Overall transfer level: Needs assistance Equipment used: Rolling walker (2 wheeled)(bariatric) Transfers: Sit to/from [facility] Sit to Stand: +2 physical assistance;+2 safety/equipment;Min assist Stand pivot transfers: +2 physical assistance;+2 safety/equipment;Min assist       General transfer  comment: Sit<>stand x2. Min assist to pivot, significant reliance on fwd flexed posture, Increased time to power to upright with UE support. Assist for safety with transitional movements using RW to pivot to chair.   Ambulation/Gait             General Gait Details: unable to perform   Stairs            Wheelchair Mobility    Modified Rankin (Stroke Patients Only)       Balance Overall balance assessment: Needs assistance Sitting-balance support: No upper extremity supported;Feet supported Sitting balance-Leahy Scale: Good Sitting balance - Comments: sitting EOB with no back support   Standing balance support: Bilateral upper extremity supported Standing balance-Leahy Scale: Poor Standing balance comment: reliant on external supports                            Cognition Arousal/Alertness: Awake/alert Behavior During Therapy: WFL for tasks assessed/performed Overall Cognitive Status: Within Functional Limits for tasks assessed Area of Impairment: Safety/judgement;Awareness                         Safety/Judgement: Decreased awareness of deficits;Decreased awareness of safety Awareness: Emergent   General Comments: Pt initially with decreased awareness of impact of deficits on ADLs - anticipate this is his baseline       Exercises      General Comments General comments (skin integrity, edema, etc.): Long discussion with pt re: safety at home and realistic expectations as well as therapy recommendations for SNF level rehab       Pertinent Vitals/Pain Pain Assessment: Faces Faces Pain Scale: Hurts  even more Pain Location: bilateral feet and LEs with movement Pain Descriptors / Indicators: Discomfort;Grimacing Pain Intervention(s): Monitored during session    Home Living                      Prior Function            PT Goals (current goals can now be found in the care plan section) Acute Rehab PT Goals Patient Stated  Goal: to go home PT Goal Formulation: With patient Time For Goal Achievement: 05/04/17 Potential to Achieve Goals: Fair Progress towards PT goals: Progressing toward goals    Frequency    Min 3X/week      PT Plan Current plan remains appropriate    Co-evaluation PT/OT/SLP Co-Evaluation/Treatment: Yes Reason for Co-Treatment: To address functional/ADL transfers;For patient/therapist safety PT goals addressed during session: Mobility/safety with mobility OT goals addressed during session: ADL's and self-care      AM-PAC PT "6 Clicks" Daily Activity  Outcome Measure  Difficulty turning over in bed (including adjusting bedclothes, sheets and blankets)?: Unable Difficulty moving from lying on back to sitting on the side of the bed? : Unable Difficulty sitting down on and standing up from a chair with arms (e.g., wheelchair, bedside commode, etc,.)?: Unable Help needed moving to and from a bed to chair (including a wheelchair)?: Total Help needed walking in hospital room?: Total Help needed climbing 3-5 steps with a railing? : Total 6 Click Score: 6    End of Session   Activity Tolerance: Patient limited by pain Patient left: in chair;with call bell/phone within reach Nurse Communication: Mobility status PT Visit Diagnosis: Other abnormalities of gait and mobility (R26.89);Pain Pain - Right/Left: (Bilateral) Pain - part of body: Ankle and joints of foot;Knee(R knee)     Time: 7341-9379 PT Time Calculation (min) (ACUTE ONLY): 23 min  Charges:  $Therapeutic Activity: 8-22 mins                    G Codes:       Alben Deeds, PT DPT  Board Certified Neurologic Specialist [place] 04/25/2017, 1:28 PM

## 2017-04-26 LAB — GLUCOSE, CAPILLARY
GLUCOSE-CAPILLARY: 109 mg/dL — AB (ref 65–99)
GLUCOSE-CAPILLARY: 174 mg/dL — AB (ref 65–99)
GLUCOSE-CAPILLARY: 168 mg/dL — AB (ref 65–99)
Glucose-Capillary: 148 mg/dL — ABNORMAL HIGH (ref 65–99)

## 2017-04-26 MED ORDER — FUROSEMIDE 20 MG PO TABS
20.0000 mg | ORAL_TABLET | Freq: Every day | ORAL | Status: DC
  Administered 2017-04-27 – 2017-04-28 (×2): 20 mg via ORAL
  Filled 2017-04-26 (×2): qty 1

## 2017-04-26 NOTE — Progress Notes (Signed)
PROGRESS NOTE  BLESSED COTHAM EXB:[id number] DOB: 04-07-1961 DOA: 04/18/2017 PCP: [name], NP  HPI/Recap of past 27 hours: 56 year old male with history significant for morbid obesity, hyperlipidemia, hypertension, diabetes, severe gout presents with worsening bilateral foot pain, erythema and inability to walk for the past 4 days.  Patient is currently being managed for recurrent gout flareup despite completing a course of prednisone taper about a week ago prior to admission.  Today, pt reports significant improvement in terms of pain. PT/OT recommend SNF, patient is in agreement. Denies any chest pain, worsening shortness of breath, abdominal pain, nausea/vomiting, fever/chills.  Assessment/Plan: Active Problems:   Diabetes type 2, uncontrolled (HCC)   Acute gouty arthritis   Acute gout  #Acute gout flare refractory to outpatient oral medication Improved Afebrile, leukocytosis due to steroid use X-ray of feet showed severe degenerative changes Tapered prednisone to 10 mg p.o. Daily, d/c on discharge Continue allopurinol, colchicine, Motrin as needed PT/OT rec SNF, awaiting placement  #Right knee pain X-rays showed severe osteoarthritis Topical diclofenac on the right knee Follow-up with PCP  #Type 2 diabetes with hyperglycemia A1c 6 Hyperglycemia, steroid-induced SSI, Lantus 15 units  #Hypertension Continue lisinopril, Lasix  #Morbid obesity Lifestyle modification advised   Code Status: Full  Family Communication: None at bedside  Disposition Plan: SNF once available   Consultants:  None  Procedures:  None  Antimicrobials:  None  DVT prophylaxis: Lovenox   Objective: Vitals:   04/25/17 1735 04/25/17 2242 04/26/17 0531 04/26/17 0837  BP: 125/61 (!) 115/57 140/81 (!) 131/107  Pulse: 78 79 85 81  Resp: 18 18 19 18   Temp: 97.8 F (36.6 C) 97.9 F (36.6 C) 98 F (36.7 C) 98.2 F (36.8 C)  TempSrc: Oral Oral Oral Oral  SpO2: 98%  98% 96% 99%  Weight:      Height:        Intake/Output Summary (Last 24 hours) at 04/26/2017 1527 Last data filed at 04/26/2017 1100 Gross per 24 hour  Intake 480 ml  Output 2225 ml  Net -1745 ml   Filed Weights   04/19/17 2040 04/20/17 2145 04/23/17 2051  Weight: (!) 200 kg (440 lb 14.7 oz) (!) 201.9 kg (445 lb) (!) 204.1 kg (450 lb)    Exam:   General: Alert, awake, oriented x3  Cardiovascular: S1-S2 present, no added heart sounds  Respiratory: Chest clear bilaterally  Abdomen: Soft, obese, nondistended, nontender  Musculoskeletal: Chronic venous stasis bilaterally  Skin: Normal  Psychiatry: Pleasant   Data Reviewed: CBC: Recent Labs  Lab 04/21/17 0807 04/22/17 0317 04/25/17 0458  WBC 14.4* 7.9 13.4*  NEUTROABS 12.4* 6.4  --   HGB 14.5 14.3 15.1  HCT 43.2 43.3 44.4  MCV 93.9 95.0 93.3  PLT 241 257 102   Basic Metabolic Panel: Recent Labs  Lab 04/21/17 0807 04/22/17 0317 04/23/17 0443 04/25/17 0458  NA 136 136 134*  --   K 5.0 5.1 5.0  --   CL 103 103 100*  --   CO2 26 26 27   --   GLUCOSE 263* 272* 203*  --   BUN 26* 32* 29*  --   CREATININE 1.00 1.13 1.13 1.08  CALCIUM 8.6* 8.4* 8.5*  --    GFR: Estimated Creatinine Clearance: 129.5 mL/min (by C-G formula based on SCr of 1.08 mg/dL). Liver Function Tests: No results for input(s): AST, ALT, ALKPHOS, BILITOT, PROT, ALBUMIN in the last 168 hours. No results for input(s): LIPASE, AMYLASE in the last 168 hours.  No results for input(s): AMMONIA in the last 168 hours. Coagulation Profile: No results for input(s): INR, PROTIME in the last 168 hours. Cardiac Enzymes: No results for input(s): CKTOTAL, CKMB, CKMBINDEX, TROPONINI in the last 168 hours. BNP (last 3 results) No results for input(s): PROBNP in the last 8760 hours. HbA1C: No results for input(s): HGBA1C in the last 72 hours. CBG: Recent Labs  Lab 04/25/17 1156 04/25/17 1638 04/25/17 2235 04/26/17 0820 04/26/17 1143  GLUCAP 139*  153* 147* 109* 148*   Lipid Profile: No results for input(s): CHOL, HDL, LDLCALC, TRIG, CHOLHDL, LDLDIRECT in the last 72 hours. Thyroid Function Tests: No results for input(s): TSH, T4TOTAL, FREET4, T3FREE, THYROIDAB in the last 72 hours. Anemia Panel: No results for input(s): VITAMINB12, FOLATE, FERRITIN, TIBC, IRON, RETICCTPCT in the last 72 hours. Urine analysis:    Component Value Date/Time   COLORURINE YELLOW 04/18/2017 1129   APPEARANCEUR CLEAR 04/18/2017 1129   APPEARANCEUR Clear 12/05/2015 1226   LABSPEC 1.004 (L) 04/18/2017 1129   PHURINE 6.0 04/18/2017 1129   GLUCOSEU NEGATIVE 04/18/2017 1129   HGBUR NEGATIVE 04/18/2017 [place] 04/18/2017 1129   BILIRUBINUR Negative 12/05/2015 1226   KETONESUR NEGATIVE 04/18/2017 1129   PROTEINUR NEGATIVE 04/18/2017 1129   UROBILINOGEN 0.2 12/28/2013 1117   NITRITE NEGATIVE 04/18/2017 1129   LEUKOCYTESUR NEGATIVE 04/18/2017 1129   LEUKOCYTESUR Negative 12/05/2015 1226   Sepsis Labs: @LABRCNTIP (procalcitonin:4,lacticidven:4)  )No results found for this or any previous visit (from the past 240 hour(s)).    Studies: No results found.  Scheduled Meds: . allopurinol  300 mg Oral Daily  . colchicine  0.6 mg Oral BID  . diclofenac sodium  2 g Topical TID  . enoxaparin (LOVENOX) injection  100 mg Subcutaneous Daily  . furosemide  20 mg Intravenous Daily  . insulin aspart  0-5 Units Subcutaneous QHS  . insulin aspart  0-9 Units Subcutaneous TID WC  . insulin glargine  15 Units Subcutaneous Daily  . lisinopril  2.5 mg Oral Daily  . pantoprazole  40 mg Oral Daily  . polyethylene glycol  17 g Oral Daily  . predniSONE  10 mg Oral Q breakfast    Continuous Infusions:   LOS: 5 days     Alma Friendly, MD [facility]  If 7PM-7AM, please contact night-coverage [email or web address] Password [email or web address] 04/26/2017, 3:27 PM

## 2017-04-26 NOTE — Clinical Social Work Note (Signed)
Clinical Social Work Assessment  Patient Details  Name: Ronnie Hernandez MRN: [id number] Date of Birth: 28-Sep-1960  Date of referral:  04/26/17               Reason for consult:  Facility Placement                Permission sought to share information with:  Facility Art therapist granted to share information::  Yes, Verbal Permission Granted  Name::        Agency::  SNFs  Relationship::     Contact Information:     Housing/Transportation Living arrangements for the past 2 months:  Single Family Home Source of Information:  Patient Patient Interpreter Needed:  None Criminal Activity/Legal Involvement Pertinent to Current Situation/Hospitalization:  No - Comment as needed Significant Relationships:  Siblings, Other Family Members Lives with:  Self Do you feel safe going back to the place where you live?  No Need for family participation in patient care:  No (Coment)  Care giving concerns:  CSW received consult for possible SNF placement at time of discharge. CSW spoke with patient regarding PT recommendation of SNF placement at time of discharge. Patient reported that his family is currently unable to care for patient given patient's current physical needs and fall risk. Patient expressed understanding of PT recommendation and is agreeable to SNF placement at time of discharge. CSW to continue to follow and assist with discharge planning needs.   Social Worker assessment / plan:  CSW spoke with patient concerning possibility of rehab at [facility] before returning home.  Employment status:  Disabled (Comment on whether or not currently receiving Disability) Insurance information:  Managed Medicare PT Recommendations:  [place] / Referral to community resources:  [place]  Patient/Family's Response to care:  Patient recognizes need for rehab before returning home and is agreeable to a SNF. Patient reported preference for  [facility]. CSW explained potential barriers, such as [employer] authorization and need to order a bariatric bed.   Patient/Family's Understanding of and Emotional Response to Diagnosis, Current Treatment, and Prognosis:  Patient/family is realistic regarding therapy needs and expressed being hopeful for SNF placement. Patient expressed understanding of CSW role and discharge process as well as medical condition. No questions/concerns about plan or treatment.    Emotional Assessment Appearance:  Appears stated age Attitude/Demeanor/Rapport:  Other(Appropriate) Affect (typically observed):  Accepting, Appropriate Orientation:  Oriented to Self, Oriented to Situation, Oriented to Place, Oriented to  Time Alcohol / Substance use:  Not Applicable Psych involvement (Current and /or in the community):  No (Comment)  Discharge Needs  Concerns to be addressed:  Care Coordination Readmission within the last 30 days:  No Current discharge risk:  None Barriers to Discharge:  Continued Medical Work up   [employer], [place] 04/26/2017, 9:18 AM

## 2017-04-26 NOTE — NC FL2 (Signed)
[place] LEVEL OF CARE SCREENING TOOL     IDENTIFICATION  Patient Name: Ronnie Hernandez Birthdate: 01-12-1961 Sex: male Admission Date (Current Location): 04/18/2017  [facility] and [place] Number:  Herbalist and Address:  The [place], [place] [address] [place], [place] [address]      Provider Number: [id number]  Attending Physician Name and Address:  Alma Friendly, MD  Relative Name and Phone Number:  Phineas Real niece, [phone]    Current Level of Care: Hospital Recommended Level of Care: [place] Prior Approval Number:    Date Approved/Denied:   PASRR Number: [id number]  Discharge Plan: SNF    Current Diagnoses: Patient Active Problem List   Diagnosis Date Noted  . Knee pain   . Acute gout 04/18/2017  . Pulmonary embolus ([place]) 08/05/2016  . Hyperglycemia, drug-induced 07/08/2016  . Impaired glucose tolerance 07/08/2016  . Leukocytosis 07/06/2016  . Unable to walk 07/06/2016  . Acute gouty arthritis 07/06/2016  . Abnormality of gait 02/18/2016  . Cellulitis of left lower extremity 01/27/2016  . Type 2 diabetes mellitus ([place]) 12/25/2013  . Drug-induced gout 12/25/2013  . Hyperlipidemia 08/01/2013  . Morbid obesity ([place])   . Hypertension   . Diabetes type 2, uncontrolled ([place])   . Gout   . Colon cancer screening 07/27/2011  . Benign neoplasm of colon 07/27/2011    Orientation RESPIRATION BLADDER Height & Weight     Self, Time, Situation, Place  Normal Continent Weight: (!) 204.1 kg (450 lb) Height:  5\' 7"  (170.2 cm)  BEHAVIORAL SYMPTOMS/MOOD NEUROLOGICAL BOWEL NUTRITION STATUS  (None)   Continent Diet(Please see DC Summary)  AMBULATORY STATUS COMMUNICATION OF NEEDS Skin   Extensive Assist Verbally Normal                       Personal Care Assistance Level of Assistance  Bathing, Feeding, Dressing Bathing Assistance: Maximum assistance Feeding assistance: Limited  assistance Dressing Assistance: Limited assistance     Functional Limitations Info             SPECIAL CARE FACTORS FREQUENCY  OT (By licensed OT)     PT Frequency: 5x/week OT Frequency: 3x/week            Contractures Contractures Info: Not present    Additional Factors Info  Code Status, Allergies, Insulin Sliding Scale Code Status Info: Full Allergies Info: Uloric Febuxostat   Insulin Sliding Scale Info: 3x/day with meals and at bedtime       Current Medications (04/26/2017):  This is the current hospital active medication list Current Facility-Administered Medications  Medication Dose Route Frequency Provider Last Rate Last Dose  . allopurinol (ZYLOPRIM) tablet 300 mg  300 mg Oral Daily Rosita Fire, MD   300 mg at 04/25/17 1025  . colchicine tablet 0.6 mg  0.6 mg Oral BID Rosita Fire, MD   0.6 mg at 04/25/17 2213  . diclofenac sodium (VOLTAREN) 1 % transdermal gel 2 g  2 g Topical TID Alma Friendly, MD   2 g at 04/25/17 2213  . enoxaparin (LOVENOX) injection 100 mg  100 mg Subcutaneous Daily Rosita Fire, MD   100 mg at 04/25/17 1025  . furosemide (LASIX) injection 20 mg  20 mg Intravenous Daily Alma Friendly, MD   20 mg at 04/25/17 1024  . ibuprofen (ADVIL,MOTRIN) tablet 800 mg  800 mg Oral Q6H PRN Rosita Fire, MD      .  insulin aspart (novoLOG) injection 0-5 Units  0-5 Units Subcutaneous QHS Rosita Fire, MD   Stopped at 04/23/17 2142  . insulin aspart (novoLOG) injection 0-9 Units  0-9 Units Subcutaneous TID WC Rosita Fire, MD   1 Units at 04/25/17 1750  . insulin glargine (LANTUS) injection 15 Units  15 Units Subcutaneous Daily Rosita Fire, MD   15 Units at 04/25/17 1024  . lisinopril (PRINIVIL,ZESTRIL) tablet 2.5 mg  2.5 mg Oral Daily Rosita Fire, MD   2.5 mg at 04/25/17 1024  . ondansetron (ZOFRAN) tablet 4 mg  4 mg Oral Q6H PRN Rosita Fire, MD       Or  .  ondansetron [facility]) injection 4 mg  4 mg Intravenous Q6H PRN Rosita Fire, MD   4 mg at 04/19/17 0826  . pantoprazole (PROTONIX) EC tablet 40 mg  40 mg Oral Daily Rosita Fire, MD   40 mg at 04/25/17 1023  . polyethylene glycol (MIRALAX / GLYCOLAX) packet 17 g  17 g Oral Daily Rosita Fire, MD   17 g at 04/22/17 1119  . predniSONE (DELTASONE) tablet 10 mg  10 mg Oral Q breakfast Alma Friendly, MD   10 mg at 04/25/17 1023     Discharge Medications: Please see discharge summary for a list of discharge medications.  Relevant Imaging Results:  Relevant Lab Results:   Additional Information ss # [id number]  Benard Halsted, LCSWA

## 2017-04-26 NOTE — Progress Notes (Signed)
Offers given- patient chooses [facility]- facility informed and has started insurance auth  CSW will continue to follow  Jorge Ny, [place] Social Worker [phone]

## 2017-04-27 LAB — GLUCOSE, CAPILLARY
GLUCOSE-CAPILLARY: 107 mg/dL — AB (ref 65–99)
GLUCOSE-CAPILLARY: 159 mg/dL — AB (ref 65–99)
GLUCOSE-CAPILLARY: 150 mg/dL — AB (ref 65–99)
Glucose-Capillary: 99 mg/dL (ref 65–99)

## 2017-04-27 LAB — CBC WITH DIFFERENTIAL/PLATELET
BASOS PCT: 0 %
Basophils Absolute: 0 10*3/uL (ref 0.0–0.1)
Eosinophils Absolute: 0.2 10*3/uL (ref 0.0–0.7)
Eosinophils Relative: 1 %
HEMATOCRIT: 44.8 % (ref 39.0–52.0)
Hemoglobin: 14.9 g/dL (ref 13.0–17.0)
LYMPHS PCT: 38 %
Lymphs Abs: 4.4 10*3/uL — ABNORMAL HIGH (ref 0.7–4.0)
MCH: 31 pg (ref 26.0–34.0)
MCHC: 33.3 g/dL (ref 30.0–36.0)
MCV: 93.3 fL (ref 78.0–100.0)
MONO ABS: 0.9 10*3/uL (ref 0.1–1.0)
Monocytes Relative: 7 %
NEUTROS ABS: 6.3 10*3/uL (ref 1.7–7.7)
NEUTROS PCT: 54 %
Platelets: 298 10*3/uL (ref 150–400)
RBC: 4.8 MIL/uL (ref 4.22–5.81)
RDW: 13.6 % (ref 11.5–15.5)
WBC: 11.8 10*3/uL — AB (ref 4.0–10.5)

## 2017-04-27 LAB — BASIC METABOLIC PANEL
Anion gap: 8 (ref 5–15)
BUN: 28 mg/dL — AB (ref 6–20)
CHLORIDE: 100 mmol/L — AB (ref 101–111)
CO2: 26 mmol/L (ref 22–32)
CREATININE: 1.16 mg/dL (ref 0.61–1.24)
Calcium: 8.7 mg/dL — ABNORMAL LOW (ref 8.9–10.3)
Glucose, Bld: 109 mg/dL — ABNORMAL HIGH (ref 65–99)
Potassium: 4.1 mmol/L (ref 3.5–5.1)
SODIUM: 134 mmol/L — AB (ref 135–145)

## 2017-04-27 NOTE — Discharge Summary (Signed)
Physician Discharge Summary  Ronnie Hernandez ZOX:[id number] DOB: 04/15/1961 DOA: 04/18/2017  PCP: [name], NP  Admit date: 04/18/2017 Discharge date: 04/27/2017  Time spent: 25 minutes  Recommendations for Outpatient Follow-up:  1. Patient should follow-up with primary physician regarding need for diabetes management and morbid obesity-has not been prescribed any meds on discharge 2. Patient should continue both allopurinol and colchicine prior to admission doses--no prednisone prescribed on discharge 3. Patient will discharge to skilled facility  Discharge Diagnoses:  Active Problems:   Diabetes type 2, uncontrolled ([place])   Acute gouty arthritis   Acute gout   Discharge Condition: Improved  Diet recommendation: Diabetic  Filed Weights   04/20/17 2145 04/23/17 2051 04/26/17 2052  Weight: (!) 201.9 kg (445 lb) (!) 204.1 kg (450 lb) (!) 198.2 kg (437 lb)    History of present illness:  Discharge diagnosis acute gout not responding to oral therapies  56 year old morbidly obese male with history of diabetes type 2 diet-controlled gout with recent flare hyperlipidemia hypertension admitted with bilateral foot pain erythema and unable to walk He has mostly been bedbound and was admitted because of inability to walk on 10/29 He was kept in the hospital over the course of 1029-11/7 placed on IV steroids and also high-dose colchicine and eventually tapered down to prednisone He had an A1c of 6 this admission and it was felt that patient could manage this as an outpatient without further steroids as his sugars would drop his average sugar in the hospital was 9107 He was stable discharge on 04/27/2017    Discharge Exam: Vitals:   04/27/17 0523 04/27/17 1045  BP: 140/77 133/79  Pulse: 71 80  Resp: 18 18  Temp: 98.2 F (36.8 C) 98.4 F (36.9 C)  SpO2: 99% 100%    Alert pleasant oriented no distress tolerating diet No pain No neuropathy no pain in the knee and able  to ambulate now without discomfort Therapy is recommended skilled placement  Discharge Instructions   Discharge Instructions    Diet - low sodium heart healthy   Complete by:  As directed    Increase activity slowly   Complete by:  As directed      Current Discharge Medication List    CONTINUE these medications which have NOT CHANGED   Details  allopurinol (ZYLOPRIM) 300 MG tablet Take 1 tablet (300 mg total) by mouth daily. Qty: 30 tablet, Refills: 6    colchicine 0.6 MG tablet Take 1 tablet (0.6 mg total) by mouth daily. Qty: 90 tablet, Refills: 1   Associated Diagnoses: Acute idiopathic gout of left hand    furosemide (LASIX) 20 MG tablet Take 2 tablets (40 mg total) by mouth daily. Qty: 30 tablet, Refills: 3   Associated Diagnoses: Localized edema    lisinopril (PRINIVIL,ZESTRIL) 2.5 MG tablet Take 1 tablet (2.5 mg total) by mouth daily. Qty: 30 tablet, Refills: 6    nystatin-triamcinolone ointment (MYCOLOG) Apply 1 application topically 2 (two) times daily. Qty: 30 g, Refills: 0   Associated Diagnoses: Rash      STOP taking these medications     ibuprofen (ADVIL,MOTRIN) 200 MG tablet      predniSONE (DELTASONE) 10 MG tablet        Allergies  Allergen Reactions  . Uloric [Febuxostat] Other (See Comments)    Made the skin on feet and legs "hurt"      The results of significant diagnostics from this hospitalization (including imaging, microbiology, ancillary and laboratory) are listed below for  reference.    Significant Diagnostic Studies: Dg Knee 1-2 Views Right  Result Date: 04/19/2017 CLINICAL DATA:  Pain EXAM: RIGHT KNEE - 1-2 VIEW COMPARISON:  None. FINDINGS: Frontal and lateral views obtained. No fracture or dislocation is seen. There is lateral patellar subluxation. The suprapatellar bursa is not well seen to assess for joint effusion. There is marked narrowing medially and in the patellofemoral joint regions. There is spurring in all compartments.  No erosive change. IMPRESSION: Marked osteoarthritic change with narrowing most severe medially and in the patellofemoral joint regions. Spurring in all compartments. Unable to optimally assess for a fusion due to lack of visualization of suprapatellar bursa. By report, patient was unable to cooperate to allow for imaging more superiorly. There is evidence of patellar subluxation laterally without frank fracture or dislocation. Electronically Signed   By: Lowella Grip III M.D.   On: 04/19/2017 15:02   Dg Foot Complete Left  Result Date: 04/21/2017 CLINICAL DATA:  History of morbid obesity, hyperlipidemia, hypertension, diabetes, gout. Foot pain. EXAM: LEFT FOOT - COMPLETE 3+ VIEW COMPARISON:  07/06/2016. FINDINGS: Diffuse osteopenia and degenerative change. Periarticular lucencies noted about the metatarsophalangeal joints are less marked on today's exam. No new focal erosive abnormality identified. Subluxations of the metatarsophalangeal joints are noted. Midfoot bony fusion again noted. Stable calcaneal spur. No acute bony abnormality . IMPRESSION: 1. Diffuse osteopenia and degenerative change. Periarticular lucencies noted about the metatarsophalangeal joints on prior study of 07/06/2016 are less marked on today's exam. No new focal erosive abnormality identified. No fracture or dislocation. 2. Subluxations of the metatarsophalangeal joints are noted. Midfoot bony fusion again noted. Stable calcaneal spur . Electronically Signed   By: Marcello Moores  Register   On: 04/21/2017 09:38   Dg Foot Complete Right  Result Date: 04/21/2017 CLINICAL DATA:  History of severe gout with worsening bilateral foot pain, morbid obesity EXAM: RIGHT FOOT COMPLETE - 3+ VIEW COMPARISON:  None. FINDINGS: There is marked abnormality of the right first MTP joint with loss of joint space, sclerosis, and spurring present. These findings months are most typical of severe degenerative arthritis but superimposed gout cannot be excluded.  Degenerative change of the right fifth MTP joint also is noted. There is degenerative change at spiculation of the tarsals and metatarsals as well. The majority of these changes do appear to be degenerative although some superimposed changes of gout cannot be excluded. IMPRESSION: Primarily severe degenerative changes involve the right first and fifth MTP joints as well as the tarsal metatarsal articulations. Superimposed gouty changes cannot be excluded. Electronically Signed   By: Ivar Drape M.D.   On: 04/21/2017 09:39    Microbiology: No results found for this or any previous visit (from the past 240 hour(s)).   Labs: Basic Metabolic Panel: Recent Labs  Lab 04/21/17 0807 04/22/17 0317 04/23/17 0443 04/25/17 0458 04/27/17 0334  NA 136 136 134*  --  134*  K 5.0 5.1 5.0  --  4.1  CL 103 103 100*  --  100*  CO2 26 26 27   --  26  GLUCOSE 263* 272* 203*  --  109*  BUN 26* 32* 29*  --  28*  CREATININE 1.00 1.13 1.13 1.08 1.16  CALCIUM 8.6* 8.4* 8.5*  --  8.7*   Liver Function Tests: No results for input(s): AST, ALT, ALKPHOS, BILITOT, PROT, ALBUMIN in the last 168 hours. No results for input(s): LIPASE, AMYLASE in the last 168 hours. No results for input(s): AMMONIA in the last 168 hours. CBC:  Recent Labs  Lab 04/21/17 0807 04/22/17 0317 04/25/17 0458 04/27/17 0334  WBC 14.4* 7.9 13.4* 11.8*  NEUTROABS 12.4* 6.4  --  6.3  HGB 14.5 14.3 15.1 14.9  HCT 43.2 43.3 44.4 44.8  MCV 93.9 95.0 93.3 93.3  PLT 241 257 331 298   Cardiac Enzymes: No results for input(s): CKTOTAL, CKMB, CKMBINDEX, TROPONINI in the last 168 hours. BNP: BNP (last 3 results) Recent Labs    08/05/16 1711  BNP 15.2    ProBNP (last 3 results) No results for input(s): PROBNP in the last 8760 hours.  CBG: Recent Labs  Lab 04/26/17 1143 04/26/17 1707 04/26/17 2050 04/27/17 0809 04/27/17 1144  GLUCAP 148* 174* 168* 99 107*       Signed:  Nita Sells MD   [facility] 04/27/2017, 2:48 PM

## 2017-04-27 NOTE — Clinical Social Work Placement (Signed)
   CLINICAL SOCIAL WORK PLACEMENT  NOTE  Date:  04/27/2017  Patient Details  Name: Ronnie Hernandez MRN: [id number] Date of Birth: [birth year]  Clinical Social Work is seeking post-discharge placement for this patient at the [place] level of care (*CSW will initial, date and re-position this form in  chart as items are completed):  Yes   Patient/family provided with [facility] Work Department's list of facilities offering this level of care within the geographic area requested by the patient (or if unable, by the patient's family).  Yes   Patient/family informed of their freedom to choose among providers that offer the needed level of care, that participate in Medicare, Medicaid or managed care program needed by the patient, have an available bed and are willing to accept the patient.  Yes   Patient/family informed of [facility]'s ownership interest in [facility] and [facility], as well as of the fact that they are under no obligation to receive care at these facilities.  PASRR submitted to EDS on       PASRR number received on       Existing PASRR number confirmed on 04/26/17     FL2 transmitted to all facilities in geographic area requested by pt/family on 04/26/17     FL2 transmitted to all facilities within larger geographic area on       Patient informed that his/her managed care company has contracts with or will negotiate with certain facilities, including the following:        Yes   Patient/family informed of bed offers received.  Patient chooses bed at [facility]     Physician recommends and patient chooses bed at      Patient to be transferred to [facility] on 04/27/17.  Patient to be transferred to facility by       Patient family notified on 04/27/17 of transfer.  Name of family member notified:        PHYSICIAN Please sign FL2     Additional  Comment:    _______________________________________________ Jorge Ny, LCSW 04/27/2017, 3:07 PM

## 2017-04-27 NOTE — Progress Notes (Signed)
Occupational Therapy Treatment Patient Details Name: Ronnie Hernandez MRN: [id number] DOB: April 13, 1961 Today's Date: 04/27/2017    History of present illness Ronnie Hernandez is a 56 y.o. male with medical history significant of morbid obesity, hyperlipidemia, hypertension, type 2 diabetes, gout with recent flare completed a course of prednisone taper about a week ago presented with severe bilateral foot pain, erythema and unable to walk since 10/26 which is 4 days prior to admission.   OT comments  Pt progressing towards OT goals this session. Session focused on transfers sit to stand with increasing standing balance time used to focus on activity tolerance for grooming and other ADL activities. Pt also educated in exercises he can do with BUE to facilitate smoother transfers and build strength for increased activity tolerance. Pt continues to be positive, pleasant and very motivated to return to PLOF. OT will continue to follow prior to dc to venue below.   Follow Up Recommendations  SNF;Supervision/Assistance - 24 hour    Equipment Recommendations  Wheelchair (measurements OT)    Recommendations for Other Services      Precautions / Restrictions Precautions Precautions: Fall Precaution Comments: bariatric patient 440# Restrictions Weight Bearing Restrictions: No       Mobility Bed Mobility               General bed mobility comments: Pt OOB in recliner when Therapy entered  Transfers Overall transfer level: Needs assistance Equipment used: Rolling walker (2 wheeled)(bariatric) Transfers: Sit to/from Stand Sit to Stand: Min guard;+2 safety/equipment(safe hand placement, pushing up from recliner)         General transfer comment: sit to stand x 5, min guard +2 for safety, increasing standing time each transfer, LOB x2 to the right, Pt able to self correct, and return to sitting safely    Balance Overall balance assessment: Needs assistance Sitting-balance  support: No upper extremity supported;Feet supported Sitting balance-Leahy Scale: Good   Postural control: Other (comment)(froward flexion in standing) Standing balance support: Bilateral upper extremity supported Standing balance-Leahy Scale: Poor Standing balance comment: reliant on external supports                           ADL either performed or assessed with clinical judgement   ADL Overall ADL's : Needs assistance/impaired Eating/Feeding: Set up;Sitting Eating/Feeding Details (indicate cue type and reason): in recliner at end of session Grooming: Wash/dry face;Min guard;Standing Grooming Details (indicate cue type and reason): standing working on Architect: +2 for safety/equipment;Stand-pivot;BSC;RW;Minimal assistance Toilet Transfer Details (indicate cue type and reason): Pt requires increased time and effort to pivot.  He demonstrates difficulty moving into trunk extension.  Maintains LEs abducted with feet externally rotated during transfer.  Very close min assist provided            General ADL Comments: Pt with improved activity tolerance and demonstrating increased balance during standing grooming activities     Vision       Perception     Praxis      Cognition Arousal/Alertness: Awake/alert Behavior During Therapy: WFL for tasks assessed/performed Overall Cognitive Status: Within Functional Limits for tasks assessed  Exercises Exercises: General Upper Extremity;General Lower Extremity General Exercises - Upper Extremity Shoulder Flexion: AROM;Both;10 reps;Seated Shoulder Extension: AROM;Both;10 reps;Seated Shoulder ADduction: AROM;Both;10 reps;Seated Chair Push Up: AROM;Both;5 reps;Seated   Shoulder Instructions       General Comments Pt continues to be VERY self motivated and told therapy that he wants to get back to the gym, at one point was  down to 350, but his personal trainer moved away. Wants to start water aerobics again.    Pertinent Vitals/ Pain       Pain Assessment: No/denies pain Pain Intervention(s): Monitored during session  Home Living                                          Prior Functioning/Environment              Frequency  Min 2X/week        Progress Toward Goals  OT Goals(current goals can now be found in the care plan section)  Progress towards OT goals: Progressing toward goals  Acute Rehab OT Goals Patient Stated Goal: to get stronger and get back to weight loss OT Goal Formulation: With patient Time For Goal Achievement: 05/04/17 Potential to Achieve Goals: Good  Plan Discharge plan remains appropriate    Co-evaluation    PT/OT/SLP Co-Evaluation/Treatment: Yes Reason for Co-Treatment: For patient/therapist safety;To address functional/ADL transfers PT goals addressed during session: Mobility/safety with mobility OT goals addressed during session: ADL's and self-care;Strengthening/ROM      AM-PAC PT "6 Clicks" Daily Activity     Outcome Measure   Help from another person eating meals?: None Help from another person taking care of personal grooming?: A Little Help from another person toileting, which includes using toliet, bedpan, or urinal?: A Little Help from another person bathing (including washing, rinsing, drying)?: A Lot Help from another person to put on and taking off regular upper body clothing?: A Little Help from another person to put on and taking off regular lower body clothing?: A Lot 6 Click Score: 17    End of Session Equipment Utilized During Treatment: Gait belt;Rolling walker  OT Visit Diagnosis: Unsteadiness on feet (R26.81);Other abnormalities of gait and mobility (R26.89);Pain   Activity Tolerance Patient tolerated treatment well   Patient Left in chair;with call bell/phone within reach   Nurse Communication Mobility status         Time: 3976-7341 OT Time Calculation (min): 27 min  Charges: OT General Charges $OT Visit: 1 Visit OT Treatments $Therapeutic Activity: 8-22 mins  Hulda Humphrey OTR/L [place] 04/27/2017, 9:25 AM

## 2017-04-27 NOTE — Progress Notes (Signed)
Physical Therapy Treatment Patient Details Name: Ronnie Hernandez MRN: [id number] DOB: [birth year] Today's Date: 04/27/2017    History of Present Illness [name] is a 56 y.o. male with medical history significant of morbid obesity, hyperlipidemia, hypertension, type 2 diabetes, gout with recent flare completed a course of prednisone taper about a week ago presented with severe bilateral foot pain, erythema and unable to walk since 10/26 which is 4 days prior to admission.    PT Comments    Pt seen for mobility progression and making steady progress towards achieving his current functional mobility goals. Pt very motivated and eager to work with therapists throughout. Pt would continue to benefit from skilled physical therapy services at this time while admitted and after d/c to address the below listed limitations in order to improve overall safety and independence with functional mobility.    Follow Up Recommendations  SNF     Equipment Recommendations  Wheelchair (measurements PT);Wheelchair cushion (measurements PT);Other (comment)(bariatric RW)    Recommendations for Other Services       Precautions / Restrictions Precautions Precautions: Fall Precaution Comments: bariatric patient 440# Restrictions Weight Bearing Restrictions: No    Mobility  Bed Mobility               General bed mobility comments: pt OOB in recliner chair upon arrival  Transfers Overall transfer level: Needs assistance Equipment used: Rolling walker (2 wheeled)(bari walker) Transfers: Sit to/from Stand Sit to Stand: Min guard;+2 safety/equipment(stabilizing RW)         General transfer comment: sit to stand x 5, min guard +2 for safety, increasing standing time each transfer, LOB x2 to the right, Pt able to self correct, and return to sitting safely  Ambulation/Gait Ambulation/Gait assistance: Min assist;+2 safety/equipment   Assistive device: Rolling walker (2 wheeled)(bari  walker) Gait Pattern/deviations: Step-to pattern;Step-through pattern;Decreased step length - right;Decreased step length - left;Decreased stride length;Shuffle;Trunk flexed Gait velocity: decreased Gait velocity interpretation: Below normal speed for age/gender General Gait Details: pt took 4 small steps forwards and backwards with min A x2 for safety and stability; pt limited secondary to endurance   Stairs            Wheelchair Mobility    Modified Rankin (Stroke Patients Only)       Balance Overall balance assessment: Needs assistance Sitting-balance support: No upper extremity supported;Feet supported Sitting balance-Leahy Scale: Good Sitting balance - Comments: able to sit upright in recliner chair with no back support Postural control: Other (comment)(froward flexion in standing) Standing balance support: Bilateral upper extremity supported Standing balance-Leahy Scale: Poor Standing balance comment: reliant on external supports                            Cognition Arousal/Alertness: Awake/alert Behavior During Therapy: WFL for tasks assessed/performed Overall Cognitive Status: Within Functional Limits for tasks assessed                                        Exercises General Exercises - Upper Extremity Shoulder Flexion: AROM;Both;10 reps;Seated Shoulder Extension: AROM;Both;10 reps;Seated Shoulder ADduction: AROM;Both;10 reps;Seated Chair Push Up: AROM;Both;5 reps;Seated General Exercises - Lower Extremity Long Arc Quad: AROM;Strengthening;15 reps;Seated Hip ABduction/ADduction: AROM;Strengthening;Both;15 reps;Seated Hip Flexion/Marching: AROM;Strengthening;Both;15 reps;Seated Mini-Sqauts: AROM;Strengthening;Both;5 reps;Standing    General Comments General comments (skin integrity, edema, etc.): Pt continues to be VERY self motivated and told  therapy that he wants to get back to the gym, at one point was down to 350, but his  personal trainer moved away. Wants to start water aerobics again.      Pertinent Vitals/Pain Pain Assessment: No/denies pain Pain Intervention(s): Monitored during session    Home Living                      Prior Function            PT Goals (current goals can now be found in the care plan section) Acute Rehab PT Goals Patient Stated Goal: to get stronger and get back to weight loss PT Goal Formulation: With patient Time For Goal Achievement: 05/04/17 Potential to Achieve Goals: Fair Progress towards PT goals: Progressing toward goals    Frequency    Min 3X/week      PT Plan Current plan remains appropriate    Co-evaluation PT/OT/SLP Co-Evaluation/Treatment: Yes Reason for Co-Treatment: For patient/therapist safety PT goals addressed during session: Mobility/safety with mobility;Balance;Proper use of DME;Strengthening/ROM OT goals addressed during session: ADL's and self-care;Strengthening/ROM      AM-PAC PT "6 Clicks" Daily Activity  Outcome Measure  Difficulty turning over in bed (including adjusting bedclothes, sheets and blankets)?: Unable Difficulty moving from lying on back to sitting on the side of the bed? : Unable Difficulty sitting down on and standing up from a chair with arms (e.g., wheelchair, bedside commode, etc,.)?: Unable Help needed moving to and from a bed to chair (including a wheelchair)?: A Little Help needed walking in hospital room?: A Lot Help needed climbing 3-5 steps with a railing? : Total 6 Click Score: 9    End of Session Equipment Utilized During Treatment: Gait belt Activity Tolerance: Patient limited by fatigue Patient left: in chair;with call bell/phone within reach Nurse Communication: Mobility status PT Visit Diagnosis: Other abnormalities of gait and mobility (R26.89)     Time: 6834-1962 PT Time Calculation (min) (ACUTE ONLY): 27 min  Charges:  $Therapeutic Activity: 8-22 mins                    G Codes:        [place], PT, DPT [employer] 04/27/2017, 9:43 AM

## 2017-04-28 LAB — GLUCOSE, CAPILLARY
Glucose-Capillary: 101 mg/dL — ABNORMAL HIGH (ref 65–99)
Glucose-Capillary: 106 mg/dL — ABNORMAL HIGH (ref 65–99)

## 2017-04-28 NOTE — Progress Notes (Signed)
[facility] has denied rehab stay for patient- offered Peer to Peer if wanted but patient states he feels as if he can go home with wheelchair and walker  CSW updated RNCM who will follow up with pt regarding home health  CSW signing off  Jorge Ny, [place] Social Worker [phone]

## 2017-04-28 NOTE — Care Management Note (Addendum)
Case Management Note Previous Note Created by Carles Collet  Patient Details  Name: Ronnie Hernandez MRN: [id number] Date of Birth: 1961/04/27  Subjective/Objective:                 Spoke w patient at the bedside, he lives at home alone, has friends that come and help him. He drives. He has a 4 point cane at bedside from home. He states he would like an elctric WC and CM instructed him he would need t obtain this through his PCP, he verbalized understanding. He is working w PT and he states he is getting much better each day with his movement. He states that he would like to use [facility] if [facility] services are needed at the time of DC.    Action/Plan:   CM will continue to follow and make referrals as needed.  Expected Discharge Date:  04/27/17               Expected Discharge Plan:  [place]  In-House Referral:  Clinical Social Work  Discharge planning Services  CM Consult  Post Acute Care Choice:  Home Health Choice offered to:  Patient  DME Arranged:  Walker rolling, Wheelchair manual (bariatric) DME Agency:  [place]:  PT [place] Agency:  [facility]  Status of Service:  Completed, signed off  If discussed at [place] of Stay Meetings, dates discussed:    Additional Comments: Pt declined to pay for rolling walker as required by [facility] - pt states he has one in the home and he will just use it (non bariatric).  CM also informed pt that he can purchase at local DME stores.  Pt will receive bariatric RW prior to discharge  Pt was declined for SNF by insurance, pt is agreeable to discharge home with [facility].  Pt states he stay with a roommate - pt will transport home via private vehicle driven by roommate.  Pt informed CM that he would like to use [facility] for [facility] instead of [facility].  Pt declined all HH except PT.  Pt assured CM that he will be safe in the home with the rolling walker and the wheelchair, roommate will provide whatever  assistance and support required.  [facility] contacted and referral accepted - agency informed that pt will discharge home today.  Pt has active relationship with PCP and denied barriers with obtaining and paying for medications Maryclare Labrador, RN 04/28/2017, 10:06 AM

## 2017-04-28 NOTE — Progress Notes (Signed)
Patient seen examined and assessed and unfortunately patient does not meet criterion for skilled care at this time I have ordered the necessary DME durable equipment and home health and there is no change the discharge summary from yesterday please see that for further details   he is stabilized for discharge 04/28/2017

## 2017-04-28 NOTE — Progress Notes (Signed)
Patient discharged to home with home health.  Patient received wheelchair as ordered. All discharge instructions reviewed with patient. Patient aware of followup appts.  Sheliah Plane RN

## 2017-04-28 NOTE — Progress Notes (Signed)
    Durable Medical Equipment  (From admission, onward)        Start     Ordered   04/28/17 0945  For home use only DME standard manual wheelchair with seat cushion  Once    Comments:  Patient suffers from gout which impairs their ability to perform daily activities like bathing, dressing, feeding and grooming in the home.  A crutch will not resolve  issue with performing activities of daily living. A wheelchair will allow patient to safely perform daily activities. Patient can safely propel the wheelchair in the home or has a caregiver who can provide assistance.  Accessories: elevating leg rests (ELRs), wheel locks, extensions and anti-tippers.   04/28/17 0944   04/28/17 0944  For home use only DME Walker rolling  Once    Question:  Patient needs a walker to treat with the following condition  Answer:  Gout   04/28/17 0944

## 2017-04-28 NOTE — Care Management Important Message (Signed)
Important Message  Patient Details  Name: Ronnie Hernandez MRN: [id number] Date of Birth: 27-Jun-1960   Medicare Important Message Given:  Yes    Nathen May 04/28/2017, 12:44 PM

## 2017-05-02 ENCOUNTER — Telehealth: Payer: Self-pay

## 2017-05-02 NOTE — Telephone Encounter (Signed)
Transition Care Management Follow-Up Telephone Call   Date discharged and where: [facility] 04/28/2017  How have you been since you were released from the hospital? Stomach been a little queasy.  Any patient concerns? None  Items Reviewed:   Meds: Y  Allergies: Y  Dietary Changes Reviewed: Y  Functional Questionnaire:  Independent-I Dependent-D  ADLs:   Dressing- I    Eating-I   Maintaining continence-I   Transferring- I w/ cane   Transportation-I   Meal Prep-I   Managing Meds- I  Confirmed importance and Date/Time of follow-up visits scheduled:Yes, Dr. Eulas Post 05/11/2017 @ 8:15am  Confirmed with patient if condition worsens to call PCP or go to the Emergency Dept. Patient was given office number and encouraged to call back with questions or concerns: Yes

## 2017-05-02 NOTE — Telephone Encounter (Signed)
Noted but this Jessica's patient

## 2017-05-03 DIAGNOSIS — E785 Hyperlipidemia, unspecified: Secondary | ICD-10-CM | POA: Diagnosis not present

## 2017-05-03 DIAGNOSIS — M009 Pyogenic arthritis, unspecified: Secondary | ICD-10-CM | POA: Diagnosis not present

## 2017-05-03 DIAGNOSIS — I10 Essential (primary) hypertension: Secondary | ICD-10-CM | POA: Diagnosis not present

## 2017-05-03 DIAGNOSIS — E1165 Type 2 diabetes mellitus with hyperglycemia: Secondary | ICD-10-CM | POA: Diagnosis not present

## 2017-05-06 DIAGNOSIS — E785 Hyperlipidemia, unspecified: Secondary | ICD-10-CM | POA: Diagnosis not present

## 2017-05-06 DIAGNOSIS — I10 Essential (primary) hypertension: Secondary | ICD-10-CM | POA: Diagnosis not present

## 2017-05-06 DIAGNOSIS — E1165 Type 2 diabetes mellitus with hyperglycemia: Secondary | ICD-10-CM | POA: Diagnosis not present

## 2017-05-06 DIAGNOSIS — M009 Pyogenic arthritis, unspecified: Secondary | ICD-10-CM | POA: Diagnosis not present

## 2017-05-11 ENCOUNTER — Encounter: Payer: Self-pay | Admitting: Nurse Practitioner

## 2017-05-11 ENCOUNTER — Encounter: Payer: Self-pay | Admitting: Internal Medicine

## 2017-05-12 DIAGNOSIS — J45998 Other asthma: Secondary | ICD-10-CM | POA: Diagnosis not present

## 2017-05-17 DIAGNOSIS — E785 Hyperlipidemia, unspecified: Secondary | ICD-10-CM | POA: Diagnosis not present

## 2017-05-17 DIAGNOSIS — I10 Essential (primary) hypertension: Secondary | ICD-10-CM | POA: Diagnosis not present

## 2017-05-17 DIAGNOSIS — M009 Pyogenic arthritis, unspecified: Secondary | ICD-10-CM | POA: Diagnosis not present

## 2017-05-17 DIAGNOSIS — E1165 Type 2 diabetes mellitus with hyperglycemia: Secondary | ICD-10-CM | POA: Diagnosis not present

## 2017-05-19 DIAGNOSIS — I10 Essential (primary) hypertension: Secondary | ICD-10-CM | POA: Diagnosis not present

## 2017-05-19 DIAGNOSIS — E1165 Type 2 diabetes mellitus with hyperglycemia: Secondary | ICD-10-CM | POA: Diagnosis not present

## 2017-05-19 DIAGNOSIS — M009 Pyogenic arthritis, unspecified: Secondary | ICD-10-CM | POA: Diagnosis not present

## 2017-05-19 DIAGNOSIS — E785 Hyperlipidemia, unspecified: Secondary | ICD-10-CM | POA: Diagnosis not present

## 2017-05-23 ENCOUNTER — Encounter: Payer: Self-pay | Admitting: Nurse Practitioner

## 2017-05-24 DIAGNOSIS — M009 Pyogenic arthritis, unspecified: Secondary | ICD-10-CM | POA: Diagnosis not present

## 2017-05-24 DIAGNOSIS — E1165 Type 2 diabetes mellitus with hyperglycemia: Secondary | ICD-10-CM | POA: Diagnosis not present

## 2017-05-24 DIAGNOSIS — E785 Hyperlipidemia, unspecified: Secondary | ICD-10-CM | POA: Diagnosis not present

## 2017-05-24 DIAGNOSIS — I10 Essential (primary) hypertension: Secondary | ICD-10-CM | POA: Diagnosis not present

## 2017-05-28 DIAGNOSIS — M25569 Pain in unspecified knee: Secondary | ICD-10-CM | POA: Diagnosis not present

## 2017-05-28 DIAGNOSIS — R269 Unspecified abnormalities of gait and mobility: Secondary | ICD-10-CM | POA: Diagnosis not present

## 2017-06-11 DIAGNOSIS — J45998 Other asthma: Secondary | ICD-10-CM | POA: Diagnosis not present

## 2017-06-27 ENCOUNTER — Other Ambulatory Visit: Payer: Medicare HMO

## 2017-06-28 DIAGNOSIS — R269 Unspecified abnormalities of gait and mobility: Secondary | ICD-10-CM | POA: Diagnosis not present

## 2017-06-28 DIAGNOSIS — M25569 Pain in unspecified knee: Secondary | ICD-10-CM | POA: Diagnosis not present

## 2017-06-29 ENCOUNTER — Ambulatory Visit: Payer: Medicare HMO | Admitting: Nurse Practitioner

## 2017-07-12 DIAGNOSIS — J45998 Other asthma: Secondary | ICD-10-CM | POA: Diagnosis not present

## 2017-07-26 ENCOUNTER — Telehealth: Payer: Self-pay

## 2017-07-26 ENCOUNTER — Other Ambulatory Visit: Payer: Self-pay | Admitting: Nurse Practitioner

## 2017-07-26 DIAGNOSIS — E669 Obesity, unspecified: Secondary | ICD-10-CM

## 2017-07-26 DIAGNOSIS — M10042 Idiopathic gout, left hand: Secondary | ICD-10-CM

## 2017-07-26 DIAGNOSIS — E1169 Type 2 diabetes mellitus with other specified complication: Secondary | ICD-10-CM

## 2017-07-26 DIAGNOSIS — E785 Hyperlipidemia, unspecified: Secondary | ICD-10-CM

## 2017-07-26 DIAGNOSIS — I10 Essential (primary) hypertension: Secondary | ICD-10-CM

## 2017-07-26 NOTE — Telephone Encounter (Signed)
Left message on voicemail for patient to return call when available    Reason for call: Cancel lab appt per Sherrie Mustache, NP

## 2017-07-26 NOTE — Telephone Encounter (Signed)
-----   Message from [name], NP sent at 07/26/2017  9:07 AM EST ----- So it appears he was discharged to a facility after last hospitalization in October and we have not seen him since. What facility was he at and can we get his records from there? Also when was he discharged? I just hate to get a bunch of duplicate labs.  ----- Message ----- From: Logan Bores, CMA Sent: 07/25/2017   4:54 PM To: [name], NP  Need labs order for pending appointment on 07/27/17

## 2017-07-26 NOTE — Telephone Encounter (Signed)
Spoke with patient, patient states he was not seen in Rehab, patient was discharged home from the hospital in October.  Patient states he needs to reschedule both the lab and appt with Janett Billow for he is having car trouble and swelling in hands due to gout. Patient requested appointment for late March.  Janett Billow overheard conversation and states since patient was not in rehab he does need labs prior to appointment and he must keep rescheduled appointment in order to receive refills in the future. Patient informed and verbalized understanding.   Future orders placed by Sherrie Mustache, NP

## 2017-07-27 ENCOUNTER — Other Ambulatory Visit: Payer: Self-pay

## 2017-07-29 ENCOUNTER — Ambulatory Visit: Payer: Self-pay | Admitting: Nurse Practitioner

## 2017-07-29 DIAGNOSIS — R269 Unspecified abnormalities of gait and mobility: Secondary | ICD-10-CM | POA: Diagnosis not present

## 2017-07-29 DIAGNOSIS — M25569 Pain in unspecified knee: Secondary | ICD-10-CM | POA: Diagnosis not present

## 2017-08-01 ENCOUNTER — Telehealth: Payer: Self-pay | Admitting: Nurse Practitioner

## 2017-08-01 NOTE — Telephone Encounter (Signed)
Left message asking pt to confirm AWV-I w/ nurse before seeing Jessica on 09/15/17. VDM (DD)

## 2017-08-12 DIAGNOSIS — J45998 Other asthma: Secondary | ICD-10-CM | POA: Diagnosis not present

## 2017-08-24 NOTE — Telephone Encounter (Signed)
Left another msg asking pt to confirm AWV-I w/ nurse on 09/15/17. VDM (DD)

## 2017-08-26 DIAGNOSIS — M25569 Pain in unspecified knee: Secondary | ICD-10-CM | POA: Diagnosis not present

## 2017-08-26 DIAGNOSIS — R269 Unspecified abnormalities of gait and mobility: Secondary | ICD-10-CM | POA: Diagnosis not present

## 2017-08-30 ENCOUNTER — Telehealth: Payer: Self-pay | Admitting: *Deleted

## 2017-08-30 NOTE — Telephone Encounter (Signed)
Received form from [facility] [phone] requesting Diabetic Shoes and Inserts for patient. Placed form in [place] folder to review and sign.

## 2017-09-08 ENCOUNTER — Emergency Department (HOSPITAL_COMMUNITY)
Admission: EM | Admit: 2017-09-08 | Discharge: 2017-09-08 | Disposition: A | Discharge status: Home / Self Care | Payer: Medicare HMO | Attending: Emergency Medicine | Admitting: Emergency Medicine

## 2017-09-08 ENCOUNTER — Emergency Department (HOSPITAL_COMMUNITY): Payer: Medicare HMO

## 2017-09-08 ENCOUNTER — Other Ambulatory Visit: Payer: Self-pay

## 2017-09-08 ENCOUNTER — Encounter (HOSPITAL_COMMUNITY): Payer: Self-pay | Admitting: Emergency Medicine

## 2017-09-08 DIAGNOSIS — M109 Gout, unspecified: Secondary | ICD-10-CM | POA: Diagnosis not present

## 2017-09-08 DIAGNOSIS — M25512 Pain in left shoulder: Secondary | ICD-10-CM | POA: Diagnosis not present

## 2017-09-08 DIAGNOSIS — E119 Type 2 diabetes mellitus without complications: Secondary | ICD-10-CM | POA: Diagnosis not present

## 2017-09-08 DIAGNOSIS — Z79899 Other long term (current) drug therapy: Secondary | ICD-10-CM | POA: Diagnosis not present

## 2017-09-08 DIAGNOSIS — M1990 Unspecified osteoarthritis, unspecified site: Secondary | ICD-10-CM | POA: Diagnosis not present

## 2017-09-08 DIAGNOSIS — I10 Essential (primary) hypertension: Secondary | ICD-10-CM | POA: Diagnosis not present

## 2017-09-08 DIAGNOSIS — M19012 Primary osteoarthritis, left shoulder: Secondary | ICD-10-CM | POA: Diagnosis not present

## 2017-09-08 DIAGNOSIS — Z86711 Personal history of pulmonary embolism: Secondary | ICD-10-CM | POA: Insufficient documentation

## 2017-09-08 DIAGNOSIS — Z87891 Personal history of nicotine dependence: Secondary | ICD-10-CM | POA: Insufficient documentation

## 2017-09-08 MED ORDER — KETOROLAC TROMETHAMINE 60 MG/2ML IM SOLN
60.0000 mg | Freq: Once | INTRAMUSCULAR | Status: AC
Start: 2017-09-08 — End: 2017-09-08
  Administered 2017-09-08: 60 mg via INTRAMUSCULAR
  Filled 2017-09-08: qty 2

## 2017-09-08 MED ORDER — OXYCODONE-ACETAMINOPHEN 5-325 MG PO TABS
2.0000 | ORAL_TABLET | Freq: Once | ORAL | Status: AC
Start: 2017-09-08 — End: 2017-09-08
  Administered 2017-09-08: 2 via ORAL
  Filled 2017-09-08: qty 2

## 2017-09-08 MED ORDER — PREDNISONE 20 MG PO TABS: 60.0000 mg | ORAL_TABLET | Freq: Every day | ORAL | 0 refills | Status: AC

## 2017-09-08 MED ORDER — OXYCODONE-ACETAMINOPHEN 5-325 MG PO TABS: 1.0000 | ORAL_TABLET | Freq: Four times a day (QID) | ORAL | 0 refills | Status: DC | PRN

## 2017-09-08 MED ORDER — DICLOFENAC SODIUM 1 % TD GEL: 2.0000 g | Freq: Four times a day (QID) | TRANSDERMAL | 0 refills | Status: DC

## 2017-09-08 MED ORDER — PREDNISONE 20 MG PO TABS
60.0000 mg | ORAL_TABLET | Freq: Once | ORAL | Status: AC
  Administered 2017-09-08: 60 mg via ORAL
  Filled 2017-09-08: qty 3

## 2017-09-08 MED ORDER — PREDNISONE 10 MG PO TABS: 40.0000 mg | ORAL_TABLET | Freq: Every day | ORAL | 0 refills | Status: DC

## 2017-09-08 NOTE — ED Provider Notes (Signed)
[facility] DEPT Provider Note   CSN: [id number] Arrival date & time: 09/08/17  6433     History   Chief Complaint Chief Complaint  Patient presents with  . Gout    HPI Ronnie Hernandez is a 57 y.o. male.  HPI   56 year old male with extensive history of severe gout with involvement of bilateral feet, hands, and shoulders, here with severe left shoulder pain.  The patient states that for the last 1-2 weeks, he has had a report gout flare."  He is currently on colchicine.  His pain started in his bilateral feet and ankles, then moved to his right hip, then now in his left shoulder.  The pain in his other joints has improved but he said now increasingly severe aching, throbbing, left shoulder pain.  The pain is worse with any kind of movement.  It localizes to the shoulder.  Does not radiate down the arm.  Denies any trauma.  He strongly denies any fevers or recent illnesses.  He has had gout in this joint before.  He states his blood sugars have been well controlled.  No night sweats.  No other medical complaints.  Past Medical History:  Diagnosis Date  . Arthritis    "knees, hands" (04/19/2017)  . Childhood asthma   . Diabetes type 2, uncontrolled ([place])   . Gout    "have had it in both feet, both hands, right shoulder, back" (04/19/2017)  . Hyperlipidemia   . Hypertension   . Morbidly obese ([place])   . Pulmonary embolism ([place]) 2017?    Patient Active Problem List   Diagnosis Date Noted  . Knee pain   . Acute gout 04/18/2017  . Pulmonary embolus ([place]) 08/05/2016  . Hyperglycemia, drug-induced 07/08/2016  . Impaired glucose tolerance 07/08/2016  . Leukocytosis 07/06/2016  . Unable to walk 07/06/2016  . Acute gouty arthritis 07/06/2016  . Abnormality of gait 02/18/2016  . Cellulitis of left lower extremity 01/27/2016  . Type 2 diabetes mellitus ([place]) 12/25/2013  . Drug-induced gout 12/25/2013  . Hyperlipidemia 08/01/2013  . Morbid obesity  ([place])   . Hypertension   . Diabetes type 2, uncontrolled ([place])   . Gout   . Colon cancer screening 07/27/2011  . Benign neoplasm of colon 07/27/2011    Past Surgical History:  Procedure Laterality Date  . COLONOSCOPY W/ BIOPSIES AND POLYPECTOMY  07/2011   Archie Endo 07/27/2011       Home Medications    Prior to Admission medications   Medication Sig Start Date End Date Taking? Authorizing Provider  allopurinol (ZYLOPRIM) 300 MG tablet Take 1 tablet (300 mg total) by mouth daily. 03/25/17   Gildardo Cranker, DO  colchicine 0.6 MG tablet Take 1 tablet (0.6 mg total) by mouth daily. 03/25/17   Gildardo Cranker, DO  diclofenac sodium (VOLTAREN) 1 % GEL Apply 2 g topically 4 (four) times daily. 09/08/17   Duffy Bruce, MD  furosemide (LASIX) 20 MG tablet Take 2 tablets (40 mg total) by mouth daily. 03/01/16   [name], NP  lisinopril (PRINIVIL,ZESTRIL) 2.5 MG tablet Take 1 tablet (2.5 mg total) by mouth daily. 03/28/17   Gildardo Cranker, DO  nystatin-triamcinolone ointment [facility]) Apply 1 application topically 2 (two) times daily. 11/29/16   [name], NP  oxyCODONE-acetaminophen (PERCOCET/ROXICET) 5-325 MG tablet Take 1-2 tablets by mouth every 6 (six) hours as needed for severe pain. 09/08/17   Duffy Bruce, MD  predniSONE (DELTASONE) 20 MG tablet Take 3 tablets (  60 mg total) by mouth daily for 5 days. 09/08/17 09/13/17  Duffy Bruce, MD    Family History Family History  Problem Relation Age of Onset  . Colon cancer Maternal Grandfather 65  . Heart attack Father   . Stomach cancer Maternal Aunt 80    Social History Social History   Tobacco Use  . Smoking status: Former Smoker    Last attempt to quit: 02/09/1982    Years since quitting: 35.6  . Smokeless tobacco: Never Used  Substance Use Topics  . Alcohol use: No  . Drug use: Yes    Comment: 04/19/2017 "nothing since I was 21"     Allergies   Uloric [febuxostat]   Review of Systems Review of Systems    Constitutional: Negative for chills, fatigue and fever.  HENT: Negative for congestion and rhinorrhea.   Eyes: Negative for visual disturbance.  Respiratory: Negative for cough, shortness of breath and wheezing.   Cardiovascular: Negative for chest pain and leg swelling.  Gastrointestinal: Negative for abdominal pain, diarrhea, nausea and vomiting.  Genitourinary: Negative for dysuria and flank pain.  Musculoskeletal: Positive for arthralgias. Negative for neck pain and neck stiffness.  Skin: Negative for rash and wound.  Allergic/Immunologic: Negative for immunocompromised state.  Neurological: Negative for syncope, weakness and headaches.  All other systems reviewed and are negative.    Physical Exam Updated Vital Signs BP (!) 158/85 (BP Location: Right Arm)   Pulse 78   Temp 98 F (36.7 C) (Oral)   Resp 18   SpO2 100%   Physical Exam  Constitutional: He is oriented to person, place, and time. He appears well-developed and well-nourished. No distress.  HENT:  Head: Normocephalic and atraumatic.  Eyes: Conjunctivae are normal.  Neck: Neck supple.  Cardiovascular: Normal rate, regular rhythm and normal heart sounds. Exam reveals no friction rub.  No murmur heard. Pulmonary/Chest: Effort normal and breath sounds normal. No respiratory distress. He has no wheezes. He has no rales.  Abdominal: He exhibits no distension.  Musculoskeletal: He exhibits no edema.  Neurological: He is alert and oriented to person, place, and time. He exhibits normal muscle tone.  Skin: Skin is warm. Capillary refill takes less than 2 seconds.  Psychiatric: He has a normal mood and affect.  Nursing note and vitals reviewed.   UPPER EXTREMITY EXAM: LEFT  INSPECTION & PALPATION: There is exquisite tenderness with any passive range of motion of the left shoulder.  No appreciable joint swelling or warmth.  No pain at the elbow or hand.  Chronic gout deformities with gouty tophi noted throughout the  hand.  SENSORY: Sensation is intact to light touch in:  Superficial radial nerve distribution (dorsal first web space) Median nerve distribution (tip of index finger)   Ulnar nerve distribution (tip of small finger)     MOTOR:  + Motor posterior interosseous nerve (thumb IP extension) + Anterior interosseous nerve (thumb IP flexion, index finger DIP flexion) + Radial nerve (wrist extension) + Median nerve (palpable firing thenar mass) + Ulnar nerve (palpable firing of first dorsal interosseous muscle)  VASCULAR: 2+ radial pulse Brisk capillary refill < 2 sec, fingers warm and well-perfused  ED Treatments / Results  Labs (all labs ordered are listed, but only abnormal results are displayed) Labs Reviewed - No data to display  EKG  EKG Interpretation None       Radiology Dg Shoulder Left  Result Date: 09/08/2017 CLINICAL DATA:  Left shoulder pain.  No injury. EXAM: LEFT SHOULDER -  2+ VIEW COMPARISON:  07/11/2013. FINDINGS: Acromioclavicular and glenohumeral degenerative change. Prominent subacromial spurring. No evidence of fracture, dislocation, or separation. IMPRESSION: Severe acromioclavicular glenohumeral degenerative change. Prominent subacromial spurring. No acute abnormality. Electronically Signed   By: Marcello Moores  Register   On: 09/08/2017 13:28    Procedures Procedures (including critical care time)  Medications Ordered in ED Medications  oxyCODONE-acetaminophen (PERCOCET/ROXICET) 5-325 MG per tablet 2 tablet (2 tablets Oral Given 09/08/17 1209)  predniSONE (DELTASONE) tablet 60 mg (60 mg Oral Given 09/08/17 1209)  ketorolac (TORADOL) injection 60 mg (60 mg Intramuscular Given 09/08/17 1209)     Initial Impression / Assessment and Plan / ED Course  I have reviewed the triage vital signs and the nursing notes.  Pertinent labs & imaging results that were available during my care of the patient were reviewed by me and considered in my medical decision making (see chart  for details).  Clinical Course as of Sep 08 1956  Thu Sep 08, 2017  1234 Temp: 70 F (36.7 C) [CI]    Clinical Course User Index [CI] Duffy Bruce, MD    57 year old male here with severe left shoulder pain and exquisite pain with any passive range of motion.  On exam, the patient has severe gouty arthritis and tophi of bilateral upper and lower extremities.  I suspect this is recurrent gout and he has had gout in this joint before.  Plain films show severe degenerative change which is likely due to this arthritis.  He is afebrile, well-appearing, with no recent illnesses, and other joint involvement leading up to this consistent with gout rather than a septic arthritis.  Discussed likely diagnosis as well as give strict return precautions for any fevers or signs of infectious etiology, and will treat symptomatically.  Will give a sling for comfort for no more than 2-3 days, and I counseled him to continue ranging his shoulder to prevent frozen shoulder.  He will follow-up with his doctor.  Final Clinical Impressions(s) / ED Diagnoses   Final diagnoses:  Gout, arthritis       Duffy Bruce, MD 09/08/17 [phone]

## 2017-09-08 NOTE — ED Triage Notes (Signed)
Per EMS pt presents with bilateral hand, feet, shoulder, and hip pain related to gout onset yesterday.

## 2017-09-08 NOTE — ED Notes (Signed)
Patient transported to X-ray 

## 2017-09-08 NOTE — ED Notes (Signed)
Ortho tech at bedside 

## 2017-09-08 NOTE — ED Notes (Signed)
Bed: WTR6 Expected date:  Expected time:  Means of arrival:  Comments: 

## 2017-09-08 NOTE — Discharge Instructions (Addendum)
Do not wear your shoulder sling for more than 3 days  Take the meds as prescribed

## 2017-09-12 ENCOUNTER — Other Ambulatory Visit: Payer: Medicare HMO

## 2017-09-12 DIAGNOSIS — E785 Hyperlipidemia, unspecified: Secondary | ICD-10-CM | POA: Diagnosis not present

## 2017-09-12 DIAGNOSIS — E669 Obesity, unspecified: Secondary | ICD-10-CM | POA: Diagnosis not present

## 2017-09-12 DIAGNOSIS — E1169 Type 2 diabetes mellitus with other specified complication: Secondary | ICD-10-CM

## 2017-09-12 DIAGNOSIS — M10042 Idiopathic gout, left hand: Secondary | ICD-10-CM

## 2017-09-13 ENCOUNTER — Telehealth: Payer: Self-pay

## 2017-09-13 LAB — LIPID PANEL
CHOLESTEROL: 180 mg/dL (ref <200)
HDL: 41 mg/dL (ref >40)
LDL CHOLESTEROL (CALC): 118 mg/dL — AB
Non-HDL Cholesterol (Calc): 139 mg/dL (calc) — ABNORMAL HIGH (ref <130)
Total CHOL/HDL Ratio: 4.4 (calc) (ref <5.0)
Triglycerides: 107 mg/dL (ref <150)

## 2017-09-13 LAB — CBC WITH DIFFERENTIAL/PLATELET
BASOS ABS: 31 {cells}/uL (ref 0–200)
BASOS PCT: 0.4 %
EOS ABS: 100 {cells}/uL (ref 15–500)
Eosinophils Relative: 1.3 %
HCT: 43.8 % (ref 38.5–50.0)
HEMOGLOBIN: 14.7 g/dL (ref 13.2–17.1)
Lymphs Abs: 3658 cells/uL (ref 850–3900)
MCH: 30.6 pg (ref 27.0–33.0)
MCHC: 33.6 g/dL (ref 32.0–36.0)
MCV: 91.3 fL (ref 80.0–100.0)
MONOS PCT: 5.9 %
MPV: 11.3 fL (ref 7.5–12.5)
NEUTROS ABS: 3457 {cells}/uL (ref 1500–7800)
Neutrophils Relative %: 44.9 %
Platelets: 223 10*3/uL (ref 140–400)
RBC: 4.8 10*6/uL (ref 4.20–5.80)
RDW: 12.6 % (ref 11.0–15.0)
Total Lymphocyte: 47.5 %
WBC: 7.7 10*3/uL (ref 3.8–10.8)
WBCMIX: 454 {cells}/uL (ref 200–950)

## 2017-09-13 LAB — HEMOGLOBIN A1C
EAG (MMOL/L): 6.5 (calc)
Hgb A1c MFr Bld: 5.7 % of total Hgb — ABNORMAL HIGH (ref <5.7)
MEAN PLASMA GLUCOSE: 117 (calc)

## 2017-09-13 LAB — COMPLETE METABOLIC PANEL WITH GFR
AG Ratio: 1.2 (calc) (ref 1.0–2.5)
ALBUMIN MSPROF: 3.7 g/dL (ref 3.6–5.1)
ALKALINE PHOSPHATASE (APISO): 100 U/L (ref 40–115)
ALT: 43 U/L (ref 9–46)
AST: 32 U/L (ref 10–35)
BILIRUBIN TOTAL: 0.4 mg/dL (ref 0.2–1.2)
BUN/Creatinine Ratio: 21 (calc) (ref 6–22)
BUN: 27 mg/dL — AB (ref 7–25)
CHLORIDE: 107 mmol/L (ref 98–110)
CO2: 25 mmol/L (ref 20–32)
Calcium: 8.9 mg/dL (ref 8.6–10.3)
Creat: 1.26 mg/dL (ref 0.70–1.33)
GFR, EST AFRICAN AMERICAN: 73 mL/min/{1.73_m2} (ref >=60)
GFR, Est Non African American: 63 mL/min/{1.73_m2} (ref >=60)
Globulin: 3.1 g/dL (calc) (ref 1.9–3.7)
Glucose, Bld: 79 mg/dL (ref 65–99)
Potassium: 4.4 mmol/L (ref 3.5–5.3)
Sodium: 140 mmol/L (ref 135–146)
TOTAL PROTEIN: 6.8 g/dL (ref 6.1–8.1)

## 2017-09-13 LAB — URIC ACID: Uric Acid, Serum: 12 mg/dL — ABNORMAL HIGH (ref 4.0–8.0)

## 2017-09-13 MED ORDER — ATORVASTATIN CALCIUM 10 MG PO TABS: 10.0000 mg | ORAL_TABLET | Freq: Every day | ORAL | 3 refills | Status: DC

## 2017-09-13 MED ORDER — ALLOPURINOL 300 MG PO TABS: 300.0000 mg | ORAL_TABLET | Freq: Every day | ORAL | 6 refills | Status: DC

## 2017-09-13 NOTE — Telephone Encounter (Signed)
-----   Message from [name], NP sent at 09/13/2017  9:01 AM EDT ----- Uric acid level is too high, make sure he is taking allopurinol 300 mg daily and colchicine daily, may need to increase medication if he has been taking these. Cholesterol is not controlled. Recommended to start Lipitor 10 mg daily- to start 5 mg by mouth daily for 1 week then to increase to 10 mg daily

## 2017-09-13 NOTE — Telephone Encounter (Signed)
Medication list has been updated based on provider recommendations. Rx has been sent to pharmacy.

## 2017-09-15 ENCOUNTER — Ambulatory Visit (INDEPENDENT_AMBULATORY_CARE_PROVIDER_SITE_OTHER): Payer: Medicare HMO

## 2017-09-15 ENCOUNTER — Ambulatory Visit (INDEPENDENT_AMBULATORY_CARE_PROVIDER_SITE_OTHER): Payer: Medicare HMO | Admitting: Nurse Practitioner

## 2017-09-15 ENCOUNTER — Encounter: Payer: Self-pay | Admitting: Nurse Practitioner

## 2017-09-15 VITALS — BP 138/70 | HR 68 | Temp 97.9°F | Ht 67.0 in | Wt >= 6400 oz

## 2017-09-15 DIAGNOSIS — M1A9XX1 Chronic gout, unspecified, with tophus (tophi): Secondary | ICD-10-CM | POA: Diagnosis not present

## 2017-09-15 DIAGNOSIS — E1169 Type 2 diabetes mellitus with other specified complication: Secondary | ICD-10-CM | POA: Diagnosis not present

## 2017-09-15 DIAGNOSIS — Z0000 Encounter for general adult medical examination without abnormal findings: Secondary | ICD-10-CM

## 2017-09-15 DIAGNOSIS — I10 Essential (primary) hypertension: Secondary | ICD-10-CM | POA: Diagnosis not present

## 2017-09-15 DIAGNOSIS — R600 Localized edema: Secondary | ICD-10-CM

## 2017-09-15 DIAGNOSIS — E669 Obesity, unspecified: Secondary | ICD-10-CM

## 2017-09-15 DIAGNOSIS — E785 Hyperlipidemia, unspecified: Secondary | ICD-10-CM | POA: Diagnosis not present

## 2017-09-15 DIAGNOSIS — E119 Type 2 diabetes mellitus without complications: Secondary | ICD-10-CM | POA: Diagnosis not present

## 2017-09-15 DIAGNOSIS — Z135 Encounter for screening for eye and ear disorders: Secondary | ICD-10-CM | POA: Diagnosis not present

## 2017-09-15 MED ORDER — ZOSTER VAC RECOMB ADJUVANTED 50 MCG/0.5ML IM SUSR: 0.5000 mL | Freq: Once | INTRAMUSCULAR | 1 refills | Status: AC

## 2017-09-15 NOTE — Patient Instructions (Addendum)
Would recommend aspercream with lidocaine to rub on joints- over the counter  Make sure you are drinking enough water   Follow up in 3 months with fasting lab work prior to visit   [place] stands for "Dietary Approaches to Stop Hypertension." The DASH eating plan is a healthy eating plan that has been shown to reduce high blood pressure (hypertension). It may also reduce your risk for type 2 diabetes, heart disease, and stroke. The DASH eating plan may also help with weight loss. What are tips for following this plan? General guidelines  Avoid eating more than 2,300 mg (milligrams) of salt (sodium) a day. If you have hypertension, you may need to reduce your sodium intake to 1,500 mg a day.  Limit alcohol intake to no more than 1 drink a day for nonpregnant women and 2 drinks a day for men. One drink equals 12 oz of beer, 5 oz of wine, or 1 oz of hard liquor.  Work with your health care provider to maintain a healthy body weight or to lose weight. Ask what an ideal weight is for you.  Get at least 30 minutes of exercise that causes your heart to beat faster (aerobic exercise) most days of the week. Activities may include walking, swimming, or biking.  Work with your health care provider or diet and nutrition specialist (dietitian) to adjust your eating plan to your individual calorie needs. Reading food labels  Check food labels for the amount of sodium per serving. Choose foods with less than 5 percent of the Daily Value of sodium. Generally, foods with less than 300 mg of sodium per serving fit into this eating plan.  To find whole grains, look for the word "whole" as the first word in the ingredient list. Shopping  Buy products labeled as "low-sodium" or "no salt added."  Buy fresh foods. Avoid canned foods and premade or frozen meals. Cooking  Avoid adding salt when cooking. Use salt-free seasonings or herbs instead of table salt or sea salt. Check with your health  care provider or pharmacist before using salt substitutes.  Do not fry foods. Cook foods using healthy methods such as baking, boiling, grilling, and broiling instead.  Cook with heart-healthy oils, such as olive, canola, soybean, or sunflower oil. Meal planning   Eat a balanced diet that includes: ? 5 or more servings of fruits and vegetables each day. At each meal, try to fill half of your plate with fruits and vegetables. ? Up to 6-8 servings of whole grains each day. ? Less than 6 oz of lean meat, poultry, or fish each day. A 3-oz serving of meat is about the same size as a deck of cards. One egg equals 1 oz. ? 2 servings of low-fat dairy each day. ? A serving of nuts, seeds, or beans 5 times each week. ? Heart-healthy fats. Healthy fats called Omega-3 fatty acids are found in foods such as flaxseeds and coldwater fish, like sardines, salmon, and mackerel.  Limit how much you eat of the following: ? Canned or prepackaged foods. ? Food that is high in trans fat, such as fried foods. ? Food that is high in saturated fat, such as fatty meat. ? Sweets, desserts, sugary drinks, and other foods with added sugar. ? Full-fat dairy products.  Do not salt foods before eating.  Try to eat at least 2 vegetarian meals each week.  Eat more home-cooked food and less restaurant, buffet, and fast food.  When eating  at a restaurant, ask that your food be prepared with less salt or no salt, if possible. What foods are recommended? The items listed may not be a complete list. Talk with your dietitian about what dietary choices are best for you. Grains Whole-grain or whole-wheat bread. Whole-grain or whole-wheat pasta. Brown rice. [place]. Bulgur. Whole-grain and low-sodium cereals. Pita bread. Low-fat, low-sodium crackers. Whole-wheat flour tortillas. Vegetables Fresh or frozen vegetables (raw, steamed, roasted, or grilled). Low-sodium or reduced-sodium tomato and vegetable juice.  Low-sodium or reduced-sodium tomato sauce and tomato paste. Low-sodium or reduced-sodium canned vegetables. Fruits All fresh, dried, or frozen fruit. Canned fruit in natural juice (without added sugar). Meat and other protein foods Skinless chicken or [place]. Ground chicken or [place]. Pork with fat trimmed off. Fish and seafood. Egg whites. Dried beans, peas, or lentils. Unsalted nuts, nut butters, and seeds. Unsalted canned beans. Lean cuts of beef with fat trimmed off. Low-sodium, lean deli meat. Dairy Low-fat (1%) or fat-free (skim) milk. Fat-free, low-fat, or reduced-fat cheeses. Nonfat, low-sodium ricotta or cottage cheese. Low-fat or nonfat yogurt. Low-fat, low-sodium cheese. Fats and oils Soft margarine without trans fats. Vegetable oil. Low-fat, reduced-fat, or light mayonnaise and salad dressings (reduced-sodium). Canola, safflower, olive, soybean, and sunflower oils. Avocado. Seasoning and other foods Herbs. Spices. Seasoning mixes without salt. Unsalted popcorn and pretzels. Fat-free sweets. What foods are not recommended? The items listed may not be a complete list. Talk with your dietitian about what dietary choices are best for you. Grains Baked goods made with fat, such as croissants, muffins, or some breads. Dry pasta or rice meal packs. Vegetables Creamed or fried vegetables. Vegetables in a cheese sauce. Regular canned vegetables (not low-sodium or reduced-sodium). Regular canned tomato sauce and paste (not low-sodium or reduced-sodium). Regular tomato and vegetable juice (not low-sodium or reduced-sodium). Angie Fava. Olives. Fruits Canned fruit in a light or heavy syrup. Fried fruit. Fruit in cream or butter sauce. Meat and other protein foods Fatty cuts of meat. Ribs. Fried meat. Berniece Salines. Sausage. Bologna and other processed lunch meats. Salami. Fatback. Hotdogs. Bratwurst. Salted nuts and seeds. Canned beans with added salt. Canned or smoked fish. Whole eggs or egg yolks. Chicken  or [place] with skin. Dairy Whole or 2% milk, cream, and half-and-half. Whole or full-fat cream cheese. Whole-fat or sweetened yogurt. Full-fat cheese. Nondairy creamers. Whipped toppings. Processed cheese and cheese spreads. Fats and oils Butter. Stick margarine. Lard. Shortening. Ghee. Bacon fat. Tropical oils, such as coconut, palm kernel, or palm oil. Seasoning and other foods Salted popcorn and pretzels. Onion salt, garlic salt, seasoned salt, table salt, and sea salt. Worcestershire sauce. Tartar sauce. Barbecue sauce. Teriyaki sauce. Soy sauce, including reduced-sodium. Steak sauce. Canned and packaged gravies. Fish sauce. Oyster sauce. Cocktail sauce. Horseradish that you find on the shelf. Ketchup. Mustard. Meat flavorings and tenderizers. Bouillon cubes. Hot sauce and Tabasco sauce. Premade or packaged marinades. Premade or packaged taco seasonings. Relishes. Regular salad dressings. Where to find more information:  National Heart, Lung, and [place]: https://wilson-eaton.com/  American Heart Association: www.heart.org Summary  The DASH eating plan is a healthy eating plan that has been shown to reduce high blood pressure (hypertension). It may also reduce your risk for type 2 diabetes, heart disease, and stroke.  With the DASH eating plan, you should limit salt (sodium) intake to 2,300 mg a day. If you have hypertension, you may need to reduce your sodium intake to 1,500 mg a day.  When on the DASH eating plan, aim to  eat more fresh fruits and vegetables, whole grains, lean proteins, low-fat dairy, and heart-healthy fats.  Work with your health care provider or diet and nutrition specialist (dietitian) to adjust your eating plan to your individual calorie needs. This information is not intended to replace advice given to you by your health care provider. Make sure you discuss any questions you have with your health care provider. Document Released: 05/27/2011 Document Revised:  05/31/2016 Document Reviewed: 05/31/2016 Elsevier Interactive Patient Education  [employer].

## 2017-09-15 NOTE — Progress Notes (Signed)
Subjective:   HORST OSTERMILLER is a 57 y.o. male who presents for an Initial Medicare Annual Wellness Visit.   Objective:    Today's Vitals   09/15/17 1407  BP: 138/70  Pulse: 68  Temp: 97.9 F (36.6 C)  TempSrc: Oral  SpO2: 99%  Weight: (!) 475 lb (215.5 kg)  Height: 5\' 7"  (1.702 m)   Body mass index is 74.4 kg/m.  Advanced Directives 09/15/2017 04/19/2017 03/25/2017 11/20/2016 08/19/2016 08/05/2016 07/06/2016  Does Patient Have a Medical Advance Directive? No No No No No No No  Would patient like information on creating a medical advance directive? Yes (MAU/Ambulatory/Procedural Areas - Information given) No - Patient declined - - No - Patient declined No - Patient declined No - Patient declined  Pre-existing out of facility DNR order (yellow form or pink MOST form) - - - - - - -    Current Medications (verified) Outpatient Encounter Medications as of 09/15/2017  Medication Sig  . allopurinol (ZYLOPRIM) 300 MG tablet Take 1 tablet (300 mg total) by mouth daily.  Marland Kitchen atorvastatin (LIPITOR) 10 MG tablet Take 1 tablet (10 mg total) by mouth daily. Take 1/2 tablet (5 mg) for one week then increase to whole tablet daily.  . colchicine 0.6 MG tablet Take 1 tablet (0.6 mg total) by mouth daily.  . diclofenac sodium (VOLTAREN) 1 % GEL Apply 2 g topically 4 (four) times daily.  . furosemide (LASIX) 20 MG tablet Take 2 tablets (40 mg total) by mouth daily.  Marland Kitchen lisinopril (PRINIVIL,ZESTRIL) 2.5 MG tablet Take 1 tablet (2.5 mg total) by mouth daily.  Marland Kitchen nystatin-triamcinolone ointment (MYCOLOG) Apply 1 application topically 2 (two) times daily.  Marland Kitchen oxyCODONE-acetaminophen (PERCOCET/ROXICET) 5-325 MG tablet Take 1-2 tablets by mouth every 6 (six) hours as needed for severe pain.   No facility-administered encounter medications on file as of 09/15/2017.     Allergies (verified) Uloric [febuxostat]   History: Past Medical History:  Diagnosis Date  . Arthritis    "knees, hands" (04/19/2017)    . Childhood asthma   . Diabetes type 2, uncontrolled ([place])   . Gout    "have had it in both feet, both hands, right shoulder, back" (04/19/2017)  . Hyperlipidemia   . Hypertension   . Morbidly obese ([place])   . Pulmonary embolism ([place]) 2017?   Past Surgical History:  Procedure Laterality Date  . COLONOSCOPY W/ BIOPSIES AND POLYPECTOMY  07/2011   Archie Endo 07/27/2011   Family History  Problem Relation Age of Onset  . Colon cancer Maternal Grandfather 14  . Heart attack Father   . Stomach cancer Maternal Aunt 80   Social History   Socioeconomic History  . Marital status: Single    Spouse name: Not on file  . Number of children: Not on file  . Years of education: Not on file  . Highest education level: Not on file  Occupational History  . Not on file  Social Needs  . Financial resource strain: Not hard at all  . Food insecurity:    Worry: Never true    Inability: Never true  . Transportation needs:    Medical: No    Non-medical: No  Tobacco Use  . Smoking status: Former Smoker    Last attempt to quit: 02/09/1982    Years since quitting: 35.6  . Smokeless tobacco: Never Used  Substance and Sexual Activity  . Alcohol use: No  . Drug use: Yes    Comment: 04/19/2017 "nothing since I  was 21"  . Sexual activity: Never  Lifestyle  . Physical activity:    Days per week: 0 days    Minutes per session: 0 min  . Stress: Not at all  Relationships  . Social connections:    Talks on phone: More than three times a week    Gets together: More than three times a week    Attends religious service: More than 4 times per year    Active member of club or organization: No    Attends meetings of clubs or organizations: Never    Relationship status: Never married  Other Topics Concern  . Not on file  Social History Narrative  . Not on file   Tobacco Counseling Counseling given: Not Answered   Clinical Intake:  Pre-visit preparation completed: No  Pain : No/denies pain      Nutritional Status: BMI > 30  Obese Nutritional Risks: None Diabetes: Yes CBG done?: No Did pt. bring in CBG monitor from home?: No  How often do you need to have someone help you when you read instructions, pamphlets, or other written materials from your doctor or pharmacy?: 1 - Never What is the last grade level you completed in school?: HS  Interpreter Needed?: No  Information entered by :: [name], RN  Activities of Daily Living In your present state of health, do you have any difficulty performing the following activities: 09/15/2017 04/19/2017  Hearing? N N  Vision? N N  Difficulty concentrating or making decisions? N N  Walking or climbing stairs? Y Y  Dressing or bathing? Y N  Doing errands, shopping? N N  Preparing Food and eating ? N -  Using the Toilet? N -  In the past six months, have you accidently leaked urine? N -  Do you have problems with loss of bowel control? N -  Managing your Medications? N -  Managing your Finances? N -  Housekeeping or managing your Housekeeping? N -  Some recent data might be hidden     Immunizations and Health Maintenance Immunization History  Administered Date(s) Administered  . Influenza Split 08/29/2013  . Influenza,inj,Quad PF,6+ Mos 03/28/2014, 02/25/2015, 03/01/2016, 03/25/2017  . PPD Test 02/04/2016  . Pneumococcal Conjugate-13 12/25/2013  . Pneumococcal Polysaccharide-23 03/15/2016  . Tdap 08/20/2014, 01/27/2016   Health Maintenance Due  Topic Date Due  . OPHTHALMOLOGY EXAM  01/03/2015    [name], NP as PCP - General (Nurse Practitioner)  Indicate any recent Medical Services you may have received from other than [facility] providers in the past year (date may be approximate).    Assessment:   This is a routine wellness examination for Dallin.  Hearing/Vision screen Hearing Screening Comments: Reports no issues with hearing  Vision Screening Comments: Pt does not see eye doctor  regurally, pt stated he will make appointment  Dietary issues and exercise activities discussed: Current Exercise Habits: The patient does not participate in regular exercise at present, Exercise limited by: orthopedic condition(s)  Goals    None     Depression Screen PHQ 2/9 Scores 09/15/2017 03/25/2017 03/15/2016 03/01/2016  PHQ - 2 Score 0 0 0 0    Fall Risk Fall Risk  09/15/2017 03/25/2017 08/19/2016 06/29/2016 06/17/2016  Falls in the past year? No No No No No    Is the patient's home free of loose throw rugs in walkways, pet beds, electrical cords, etc?   yes      Grab bars in the bathroom?  yes      Handrails on the stairs?   yes      Adequate lighting?   yes  Cognitive Function: MMSE - Mini Mental State Exam 09/15/2017  Not completed: Unable to complete        Screening Tests Health Maintenance  Topic Date Due  . OPHTHALMOLOGY EXAM  01/03/2015  . Hepatitis C Screening  04/20/2018 (Originally October 10, 1960)  . HEMOGLOBIN A1C  03/15/2018  . FOOT EXAM  03/25/2018  . PNEUMOCOCCAL POLYSACCHARIDE VACCINE (2) 03/15/2021  . COLONOSCOPY  07/26/2021  . TETANUS/TDAP  01/26/2026  . INFLUENZA VACCINE  Completed  . HIV Screening  Completed    Qualifies for Shingles Vaccine? Due, prescription sent to pharmacy  Cancer Screenings: Lung: Low Dose CT Chest recommended if Age 57-80 years, 30 pack-year currently smoking OR have quit w/in 15years. Patient does not qualify. Colorectal: up to date  Additional Screenings:  Hepatitis C Screening: declined Diabetic eye exam ordered      Plan:    I have personally reviewed and addressed the Medicare Annual Wellness questionnaire and have noted the following in the patient's chart:  A. Medical and social history B. Use of alcohol, tobacco or illicit drugs  C. Current medications and supplements D. Functional ability and status E.  Nutritional status F.  Physical activity G. Advance directives H. List of other physicians I.    Hospitalizations, surgeries, and ER visits in previous 12 months J.  [place] to include hearing, vision, cognitive, depression L. Referrals and appointments - none  In addition, I have reviewed and discussed with patient certain preventive protocols, quality metrics, and best practice recommendations. A written personalized care plan for preventive services as well as general preventive health recommendations were provided to patient.  See attached scanned questionnaire for additional information.   Signed,   [name], RN Nurse Health Advisor  Patient Concerns: requested another brand of medication to replace Voltaren

## 2017-09-15 NOTE — Progress Notes (Signed)
Careteam: [name], NP as PCP - General (Nurse Practitioner)  Advanced Directive information    Allergies  Allergen Reactions  . Uloric [Febuxostat] Other (See Comments)    Made the skin on feet and legs "hurt"    Chief Complaint  Patient presents with  . Medical Management of Chronic Issues    Pt is being seen for a 3 month routine visit. Pt was also seen at [facility] on 09/08/17 due to gout flare in his left shoulder.      HPI: Patient is a 57 y.o. male seen in the office today for routine follow up.  Pt with hx of gout, DM, htn, obesity. Has been having a lot of difficulty since thanksgiving. Transportation issues and other issues.   Pt recently seen in ED due to gout flare- pain to his left shoulder and hip, was given prednisone and pain medication. Recent uric acid elevated at 12.0 and he was out of allopurinol. Could not afford Voltaren gel- too expensive.   Hyperlipidemia- LDL not at goal at 118 was recommended to start lipitor 5 mg daily for 1 week then to increase 10 mg, started lipitor 2 days ago.   DM- A1c at goal 5.7 - off metformin at this time.   htn- controlled on lisinopril. Has someone to help him with his diet   Does not routinely do exercises. Hoping to get his car up and running so he can go to the gym soon.   Has dry skin and his helper is helping him apply to areas he can not reach which is beneficial  Review of Systems:  Review of Systems  Constitutional: Negative for chills, fever and weight loss.  Respiratory: Negative for cough, sputum production and shortness of breath.   Cardiovascular: Negative for chest pain, palpitations and leg swelling.  Gastrointestinal: Negative for abdominal pain, constipation, diarrhea and heartburn.  Genitourinary: Negative for dysuria, frequency and urgency.  Musculoskeletal: Positive for joint pain and myalgias. Negative for back pain and falls.  Skin: Positive for itching and rash.   Gold bond lotion helpful  Neurological: Negative for dizziness and headaches.  Psychiatric/Behavioral: Negative for depression and memory loss. The patient does not have insomnia.     Past Medical History:  Diagnosis Date  . Arthritis    "knees, hands" (04/19/2017)  . Childhood asthma   . Diabetes type 2, uncontrolled ([place])   . Gout    "have had it in both feet, both hands, right shoulder, back" (04/19/2017)  . Hyperlipidemia   . Hypertension   . Morbidly obese ([place])   . Pulmonary embolism ([place]) 2017?   Past Surgical History:  Procedure Laterality Date  . COLONOSCOPY W/ BIOPSIES AND POLYPECTOMY  07/2011   Archie Endo 07/27/2011   Social History:   reports that he quit smoking about 35 years ago. He has never used smokeless tobacco. He reports that he has current or past drug history. He reports that he does not drink alcohol.  Family History  Problem Relation Age of Onset  . Colon cancer Maternal Grandfather 63  . Heart attack Father   . Stomach cancer Maternal Aunt 80    Medications: Patient's Medications  New Prescriptions   No medications on file  Previous Medications   ALLOPURINOL (ZYLOPRIM) 300 MG TABLET    Take 1 tablet (300 mg total) by mouth daily.   ATORVASTATIN (LIPITOR) 10 MG TABLET    Take 1 tablet (10 mg total) by mouth daily. Take  1/2 tablet (5 mg) for one week then increase to whole tablet daily.   COLCHICINE 0.6 MG TABLET    Take 1 tablet (0.6 mg total) by mouth daily.   DICLOFENAC SODIUM (VOLTAREN) 1 % GEL    Apply 2 g topically 4 (four) times daily.   FUROSEMIDE (LASIX) 20 MG TABLET    Take 2 tablets (40 mg total) by mouth daily.   LISINOPRIL (PRINIVIL,ZESTRIL) 2.5 MG TABLET    Take 1 tablet (2.5 mg total) by mouth daily.   NYSTATIN-TRIAMCINOLONE OINTMENT (MYCOLOG)    Apply 1 application topically 2 (two) times daily.   ZOSTER VACCINE ADJUVANTED (SHINGRIX) INJECTION    Inject 0.5 mLs into the muscle once for 1 dose.  Modified Medications   No medications on  file  Discontinued Medications   OXYCODONE-ACETAMINOPHEN (PERCOCET/ROXICET) 5-325 MG TABLET    Take 1-2 tablets by mouth every 6 (six) hours as needed for severe pain.     Physical Exam:  Vitals:   09/15/17 1406  BP: 138/70  Pulse: 68  Temp: 97.9 F (36.6 C)  TempSrc: Oral  SpO2: 99%  Weight: (!) 475 lb (215.5 kg)  Height: 5\' 7"  (1.702 m)   Body mass index is 74.4 kg/m.  Physical Exam  Constitutional: He is oriented to person, place, and time. He appears well-developed and well-nourished.  HENT:  Mouth/Throat: Oropharynx is clear and moist.  MMM; no oral thrush  Eyes: Pupils are equal, round, and reactive to light. No scleral icterus.  Neck: Neck supple. Muscular tenderness present. Carotid bruit is not present. Decreased range of motion present. No thyromegaly present.  Cardiovascular: Normal rate and regular rhythm. Exam reveals no gallop and no friction rub.  Murmur (1/6 SEM) heard. +2 pitting LE edema b/l. No calf TTP  Pulmonary/Chest: Effort normal and breath sounds normal. He has no wheezes. He has no rales. He exhibits no tenderness.  Abdominal: Soft. Normal appearance and bowel sounds are normal. There is no hepatomegaly.  obese  Musculoskeletal: He exhibits edema (small and large joints), tenderness and deformity (in small joints to bilateral hands).  Lymphadenopathy:    He has no cervical adenopathy.  Neurological: He is alert and oriented to person, place, and time.  Skin: Skin is warm and dry. Rash (eczematous generalized) noted.  Psychiatric: He has a normal mood and affect. His behavior is normal. Judgment and thought content normal.    Labs reviewed: Basic Metabolic Panel: Recent Labs    04/23/17 0443 04/25/17 0458 04/27/17 0334 09/12/17 1137  NA 134*  --  134* 140  K 5.0  --  4.1 4.4  CL 100*  --  100* 107  CO2 27  --  26 25  GLUCOSE 203*  --  109* 79  BUN 29*  --  28* 27*  CREATININE 1.13 1.08 1.16 1.26  CALCIUM 8.5*  --  8.7* 8.9   Liver  Function Tests: Recent Labs    03/23/17 1012 09/12/17 1137  AST 17 32  ALT 15 43  BILITOT 0.3 0.4  PROT 6.5 6.8   No results for input(s): LIPASE, AMYLASE in the last 8760 hours. No results for input(s): AMMONIA in the last 8760 hours. CBC: Recent Labs    04/22/17 0317 04/25/17 0458 04/27/17 0334 09/12/17 1137  WBC 7.9 13.4* 11.8* 7.7  NEUTROABS 6.4  --  6.3 3,457  HGB 14.3 15.1 14.9 14.7  HCT 43.3 44.4 44.8 43.8  MCV 95.0 93.3 93.3 91.3  PLT 257 331 298 223  Lipid Panel: Recent Labs    03/23/17 1012 09/12/17 1137  CHOL 164 180  HDL 52 41  LDLCALC 94 118*  TRIG 85 107  CHOLHDL 3.2 4.4   TSH: No results for input(s): TSH in the last 8760 hours. A1C: Lab Results  Component Value Date   HGBA1C 5.7 (H) 09/12/2017     Assessment/Plan 1. Essential hypertension Stable, not currently on medication  2. Hyperlipidemia, unspecified hyperlipidemia type -started on lipitor tolerating well. Diet and lifestyle modifications encouraged.  - COMPLETE METABOLIC PANEL WITH GFR; Future - Lipid panel; Future  3. Diabetes mellitus type 2 in obese (HCC) Stable, not currently on medication, encouraged compliance with diet and lifestyle modifications.   4. Bilateral edema of lower extremity -stable, continues on lasix daily, encouraged low sodium diet with elevation. Pt unable to place compression hose.   5. Morbidly obese ([place]) Discussed lifestyle changes with diet modifications and exercise to lose weight, pt reports he is currently changing diet and plans to get back into the gym soon. Educated on home exercise regimen as well.   6. Gout with tophi -recent flare, non-complaint with medication and ran out without getting refill. -completed prednisone taper.  -can use aspercream with lidocaine to effected joints  Next appt: 3 months with lab work prior to appt.  Carlos American. [place], [facility] [phone]

## 2017-09-15 NOTE — Patient Instructions (Signed)
Mr. Ronnie Hernandez , Thank you for taking time to come for your Medicare Wellness Visit. I appreciate your ongoing commitment to your health goals. Please review the following plan we discussed and let me know if I can assist you in the future.   Screening recommendations/referrals: Colonoscopy due,  Recommended yearly ophthalmology/optometry visit for glaucoma screening and checkup Recommended yearly dental visit for hygiene and checkup  Vaccinations: Influenza vaccine up to date, due 2019 fall season Pneumococcal vaccine up to date, completed Tdap vaccine up to date, due 01/26/2026 Shingles vaccine due, prescription sent to pharmacy    Advanced directives: Advance directive discussed with you today. I have provided a copy for you to complete at home and have notarized. Once this is complete please bring a copy in to our office so we can scan it into your chart.  Conditions/risks identified: none  Next appointment: Ronnie Dense, RN 09/20/2018 @ 1:45pm  Preventive Care 40-64 Years, Male Preventive care refers to lifestyle choices and visits with your health care provider that can promote health and wellness. What does preventive care include?  A yearly physical exam. This is also called an annual well check.  Dental exams once or twice a year.  Routine eye exams. Ask your health care provider how often you should have your eyes checked.  Personal lifestyle choices, including:  Daily care of your teeth and gums.  Regular physical activity.  Eating a healthy diet.  Avoiding tobacco and drug use.  Limiting alcohol use.  Practicing safe sex.  Taking low-dose aspirin every day starting at age 87. What happens during an annual well check? The services and screenings done by your health care provider during your annual well check will depend on your age, overall health, lifestyle risk factors, and family history of disease. Counseling  Your health care provider may ask you questions  about your:  Alcohol use.  Tobacco use.  Drug use.  Emotional well-being.  Home and relationship well-being.  Sexual activity.  Eating habits.  Work and work Statistician. Screening  You may have the following tests or measurements:  Height, weight, and BMI.  Blood pressure.  Lipid and cholesterol levels. These may be checked every 5 years, or more frequently if you are over 51 years old.  Skin check.  Lung cancer screening. You may have this screening every year starting at age 5 if you have a 30-pack-year history of smoking and currently smoke or have quit within the past 15 years.  Fecal occult blood test (FOBT) of the stool. You may have this test every year starting at age 59.  Flexible sigmoidoscopy or colonoscopy. You may have a sigmoidoscopy every 5 years or a colonoscopy every 10 years starting at age 74.  Prostate cancer screening. Recommendations will vary depending on your family history and other risks.  Hepatitis C blood test.  Hepatitis B blood test.  Sexually transmitted disease (STD) testing.  Diabetes screening. This is done by checking your blood sugar (glucose) after you have not eaten for a while (fasting). You may have this done every 1-3 years. Discuss your test results, treatment options, and if necessary, the need for more tests with your health care provider. Vaccines  Your health care provider may recommend certain vaccines, such as:  Influenza vaccine. This is recommended every year.  Tetanus, diphtheria, and acellular pertussis (Tdap, Td) vaccine. You may need a Td booster every 10 years.  Zoster vaccine. You may need this after age 57.  Pneumococcal 13-valent conjugate (PCV13)  vaccine. You may need this if you have certain conditions and have not been vaccinated.  Pneumococcal polysaccharide (PPSV23) vaccine. You may need one or two doses if you smoke cigarettes or if you have certain conditions. Talk to your health care provider  about which screenings and vaccines you need and how often you need them. This information is not intended to replace advice given to you by your health care provider. Make sure you discuss any questions you have with your health care provider. Document Released: 07/04/2015 Document Revised: 02/25/2016 Document Reviewed: 04/08/2015 Elsevier Interactive Patient Education  2017 [place] Prevention in the Home Falls can cause injuries. They can happen to people of all ages. There are many things you can do to make your home safe and to help prevent falls. What can I do on the outside of my home?  Regularly fix the edges of walkways and driveways and fix any cracks.  Remove anything that might make you trip as you walk through a door, such as a raised step or threshold.  Trim any bushes or trees on the path to your home.  Use bright outdoor lighting.  Clear any walking paths of anything that might make someone trip, such as rocks or tools.  Regularly check to see if handrails are loose or broken. Make sure that both sides of any steps have handrails.  Any raised decks and porches should have guardrails on the edges.  Have any leaves, snow, or ice cleared regularly.  Use sand or salt on walking paths during winter.  Clean up any spills in your garage right away. This includes oil or grease spills. What can I do in the bathroom?  Use night lights.  Install grab bars by the toilet and in the tub and shower. Do not use towel bars as grab bars.  Use non-skid mats or decals in the tub or shower.  If you need to sit down in the shower, use a plastic, non-slip stool.  Keep the floor dry. Clean up any water that spills on the floor as soon as it happens.  Remove soap buildup in the tub or shower regularly.  Attach bath mats securely with double-sided non-slip rug tape.  Do not have throw rugs and other things on the floor that can make you trip. What can I do in the  bedroom?  Use night lights.  Make sure that you have a light by your bed that is easy to reach.  Do not use any sheets or blankets that are too big for your bed. They should not hang down onto the floor.  Have a firm chair that has side arms. You can use this for support while you get dressed.  Do not have throw rugs and other things on the floor that can make you trip. What can I do in the kitchen?  Clean up any spills right away.  Avoid walking on wet floors.  Keep items that you use a lot in easy-to-reach places.  If you need to reach something above you, use a strong step stool that has a grab bar.  Keep electrical cords out of the way.  Do not use floor polish or wax that makes floors slippery. If you must use wax, use non-skid floor wax.  Do not have throw rugs and other things on the floor that can make you trip. What can I do with my stairs?  Do not leave any items on the stairs.  Make sure  that there are handrails on both sides of the stairs and use them. Fix handrails that are broken or loose. Make sure that handrails are as long as the stairways.  Check any carpeting to make sure that it is firmly attached to the stairs. Fix any carpet that is loose or worn.  Avoid having throw rugs at the top or bottom of the stairs. If you do have throw rugs, attach them to the floor with carpet tape.  Make sure that you have a light switch at the top of the stairs and the bottom of the stairs. If you do not have them, ask someone to add them for you. What else can I do to help prevent falls?  Wear shoes that:  Do not have high heels.  Have rubber bottoms.  Are comfortable and fit you well.  Are closed at the toe. Do not wear sandals.  If you use a stepladder:  Make sure that it is fully opened. Do not climb a closed stepladder.  Make sure that both sides of the stepladder are locked into place.  Ask someone to hold it for you, if possible.  Clearly mark and make  sure that you can see:  Any grab bars or handrails.  First and last steps.  Where the edge of each step is.  Use tools that help you move around (mobility aids) if they are needed. These include:  Canes.  Walkers.  Scooters.  Crutches.  Turn on the lights when you go into a dark area. Replace any light bulbs as soon as they burn out.  Set up your furniture so you have a clear path. Avoid moving your furniture around.  If any of your floors are uneven, fix them.  If there are any pets around you, be aware of where they are.  Review your medicines with your doctor. Some medicines can make you feel dizzy. This can increase your chance of falling. Ask your doctor what other things that you can do to help prevent falls. This information is not intended to replace advice given to you by your health care provider. Make sure you discuss any questions you have with your health care provider. Document Released: 04/03/2009 Document Revised: 11/13/2015 Document Reviewed: 07/12/2014 Elsevier Interactive Patient Education  2017 [employer].

## 2017-09-26 DIAGNOSIS — M25569 Pain in unspecified knee: Secondary | ICD-10-CM | POA: Diagnosis not present

## 2017-09-26 DIAGNOSIS — R269 Unspecified abnormalities of gait and mobility: Secondary | ICD-10-CM | POA: Diagnosis not present

## 2017-10-24 DIAGNOSIS — H04123 Dry eye syndrome of bilateral lacrimal glands: Secondary | ICD-10-CM | POA: Diagnosis not present

## 2017-10-24 DIAGNOSIS — H11423 Conjunctival edema, bilateral: Secondary | ICD-10-CM | POA: Diagnosis not present

## 2017-10-24 DIAGNOSIS — H18413 Arcus senilis, bilateral: Secondary | ICD-10-CM | POA: Diagnosis not present

## 2017-10-24 DIAGNOSIS — H25013 Cortical age-related cataract, bilateral: Secondary | ICD-10-CM | POA: Diagnosis not present

## 2017-10-24 DIAGNOSIS — H40033 Anatomical narrow angle, bilateral: Secondary | ICD-10-CM | POA: Diagnosis not present

## 2017-10-24 DIAGNOSIS — H5203 Hypermetropia, bilateral: Secondary | ICD-10-CM | POA: Diagnosis not present

## 2017-10-24 DIAGNOSIS — H52223 Regular astigmatism, bilateral: Secondary | ICD-10-CM | POA: Diagnosis not present

## 2017-10-24 DIAGNOSIS — H40023 Open angle with borderline findings, high risk, bilateral: Secondary | ICD-10-CM | POA: Diagnosis not present

## 2017-10-24 DIAGNOSIS — H2513 Age-related nuclear cataract, bilateral: Secondary | ICD-10-CM | POA: Diagnosis not present

## 2017-10-24 DIAGNOSIS — H524 Presbyopia: Secondary | ICD-10-CM | POA: Diagnosis not present

## 2017-10-26 DIAGNOSIS — M25569 Pain in unspecified knee: Secondary | ICD-10-CM | POA: Diagnosis not present

## 2017-10-26 DIAGNOSIS — R269 Unspecified abnormalities of gait and mobility: Secondary | ICD-10-CM | POA: Diagnosis not present

## 2017-11-15 ENCOUNTER — Encounter (HOSPITAL_COMMUNITY): Payer: Self-pay

## 2017-11-15 ENCOUNTER — Other Ambulatory Visit: Payer: Self-pay

## 2017-11-15 ENCOUNTER — Emergency Department (HOSPITAL_COMMUNITY): Payer: Medicare HMO

## 2017-11-15 ENCOUNTER — Emergency Department (HOSPITAL_COMMUNITY)
Admission: EM | Admit: 2017-11-15 | Discharge: 2017-11-15 | Disposition: A | Discharge status: Home / Self Care | Payer: Medicare HMO | Attending: Emergency Medicine | Admitting: Emergency Medicine

## 2017-11-15 DIAGNOSIS — Z79899 Other long term (current) drug therapy: Secondary | ICD-10-CM | POA: Diagnosis not present

## 2017-11-15 DIAGNOSIS — E119 Type 2 diabetes mellitus without complications: Secondary | ICD-10-CM | POA: Diagnosis not present

## 2017-11-15 DIAGNOSIS — M10241 Drug-induced gout, right hand: Secondary | ICD-10-CM | POA: Diagnosis not present

## 2017-11-15 DIAGNOSIS — M109 Gout, unspecified: Secondary | ICD-10-CM | POA: Diagnosis present

## 2017-11-15 DIAGNOSIS — R279 Unspecified lack of coordination: Secondary | ICD-10-CM | POA: Diagnosis not present

## 2017-11-15 DIAGNOSIS — Z86711 Personal history of pulmonary embolism: Secondary | ICD-10-CM | POA: Diagnosis not present

## 2017-11-15 DIAGNOSIS — R600 Localized edema: Secondary | ICD-10-CM | POA: Diagnosis not present

## 2017-11-15 DIAGNOSIS — R229 Localized swelling, mass and lump, unspecified: Secondary | ICD-10-CM | POA: Diagnosis not present

## 2017-11-15 DIAGNOSIS — Z87891 Personal history of nicotine dependence: Secondary | ICD-10-CM | POA: Insufficient documentation

## 2017-11-15 DIAGNOSIS — R0602 Shortness of breath: Secondary | ICD-10-CM | POA: Diagnosis not present

## 2017-11-15 DIAGNOSIS — T502X5A Adverse effect of carbonic-anhydrase inhibitors, benzothiadiazides and other diuretics, initial encounter: Secondary | ICD-10-CM | POA: Diagnosis not present

## 2017-11-15 DIAGNOSIS — R609 Edema, unspecified: Secondary | ICD-10-CM

## 2017-11-15 DIAGNOSIS — I10 Essential (primary) hypertension: Secondary | ICD-10-CM | POA: Insufficient documentation

## 2017-11-15 DIAGNOSIS — Z743 Need for continuous supervision: Secondary | ICD-10-CM | POA: Diagnosis not present

## 2017-11-15 LAB — URINALYSIS, ROUTINE W REFLEX MICROSCOPIC
BILIRUBIN URINE: NEGATIVE
GLUCOSE, UA: NEGATIVE mg/dL
Ketones, ur: NEGATIVE mg/dL
Leukocytes, UA: NEGATIVE
NITRITE: POSITIVE — AB
PROTEIN: NEGATIVE mg/dL
Specific Gravity, Urine: 1.021 (ref 1.005–1.030)
pH: 5 (ref 5.0–8.0)

## 2017-11-15 LAB — CBC WITH DIFFERENTIAL/PLATELET
BASOS ABS: 0 10*3/uL (ref 0.0–0.1)
BASOS PCT: 0 %
Eosinophils Absolute: 0.3 10*3/uL (ref 0.0–0.7)
Eosinophils Relative: 4 %
HEMATOCRIT: 40.5 % (ref 39.0–52.0)
HEMOGLOBIN: 13.3 g/dL (ref 13.0–17.0)
LYMPHS PCT: 31 %
Lymphs Abs: 2.1 10*3/uL (ref 0.7–4.0)
MCH: 31.3 pg (ref 26.0–34.0)
MCHC: 32.8 g/dL (ref 30.0–36.0)
MCV: 95.3 fL (ref 78.0–100.0)
MONO ABS: 0.5 10*3/uL (ref 0.1–1.0)
MONOS PCT: 8 %
NEUTROS ABS: 3.8 10*3/uL (ref 1.7–7.7)
NEUTROS PCT: 57 %
Platelets: 233 10*3/uL (ref 150–400)
RBC: 4.25 MIL/uL (ref 4.22–5.81)
RDW: 13 % (ref 11.5–15.5)
WBC: 6.7 10*3/uL (ref 4.0–10.5)

## 2017-11-15 LAB — BASIC METABOLIC PANEL
ANION GAP: 11 (ref 5–15)
BUN: 19 mg/dL (ref 6–20)
CHLORIDE: 102 mmol/L (ref 101–111)
CO2: 26 mmol/L (ref 22–32)
Calcium: 8.9 mg/dL (ref 8.9–10.3)
Creatinine, Ser: 0.99 mg/dL (ref 0.61–1.24)
GFR calc non Af Amer: >60 mL/min (ref >60)
GLUCOSE: 111 mg/dL — AB (ref 65–99)
Potassium: 3.9 mmol/L (ref 3.5–5.1)
Sodium: 139 mmol/L (ref 135–145)

## 2017-11-15 LAB — BRAIN NATRIURETIC PEPTIDE: B Natriuretic Peptide: 51.7 pg/mL (ref 0.0–100.0)

## 2017-11-15 MED ORDER — FUROSEMIDE 10 MG/ML IJ SOLN
60.0000 mg | Freq: Once | INTRAMUSCULAR | Status: AC
  Administered 2017-11-15: 60 mg via INTRAVENOUS
  Filled 2017-11-15: qty 8

## 2017-11-15 MED ORDER — OXYCODONE-ACETAMINOPHEN 5-325 MG PO TABS
2.0000 | ORAL_TABLET | Freq: Once | ORAL | Status: AC
  Administered 2017-11-15: 2 via ORAL
  Filled 2017-11-15: qty 2

## 2017-11-15 MED ORDER — OXYCODONE-ACETAMINOPHEN 5-325 MG PO TABS: 1.0000 | ORAL_TABLET | ORAL | 0 refills | Status: DC | PRN

## 2017-11-15 MED ORDER — PREDNISONE 20 MG PO TABS: 40.0000 mg | ORAL_TABLET | Freq: Every day | ORAL | 0 refills | Status: DC

## 2017-11-15 NOTE — ED Notes (Signed)
Delay in lab work--US IV requested and IV team consult placed

## 2017-11-15 NOTE — ED Provider Notes (Signed)
[facility] DEPT Provider Note   CSN: [id number] Arrival date & time: 11/15/17  1006     History   Chief Complaint No chief complaint on file.   HPI Ronnie Hernandez is a 57 y.o. male.  HPI Ronnie Hernandez is a 57 y.o. male with history of morbid obesity, hypertension, diabetes, history of pulmonary embolism not anticoagulated at this time, gout, presents to emergency department complaining of gout exacerbation the right hand and right shoulder, as well as peripheral edema.  Patient states that he has had pain and swelling in his right arm and hand for about a week.  He states he feels like prior gout.  Currently taking over-the-counter Tylenol for his pain.  He is also on colchicine and allopurinol.  He is also complaining of swelling in bilateral ankles, feet, legs, arms.  He states he takes Lasix 40 mg daily at home, but states when he takes Lasix it makes him "cramp up" so he only takes it every other day or so.  Past Medical History:  Diagnosis Date  . Arthritis    "knees, hands" (04/19/2017)  . Childhood asthma   . Diabetes type 2, uncontrolled ([place])   . Gout    "have had it in both feet, both hands, right shoulder, back" (04/19/2017)  . Hyperlipidemia   . Hypertension   . Morbidly obese ([place])   . Pulmonary embolism ([place]) 2017?    Patient Active Problem List   Diagnosis Date Noted  . Knee pain   . Acute gout 04/18/2017  . Pulmonary embolus ([place]) 08/05/2016  . Hyperglycemia, drug-induced 07/08/2016  . Impaired glucose tolerance 07/08/2016  . Leukocytosis 07/06/2016  . Unable to walk 07/06/2016  . Acute gouty arthritis 07/06/2016  . Abnormality of gait 02/18/2016  . Cellulitis of left lower extremity 01/27/2016  . Type 2 diabetes mellitus ([place]) 12/25/2013  . Drug-induced gout 12/25/2013  . Hyperlipidemia 08/01/2013  . Morbid obesity ([place])   . Hypertension   . Diabetes type 2, uncontrolled ([place])   . Gout   . Colon cancer  screening 07/27/2011  . Benign neoplasm of colon 07/27/2011    Past Surgical History:  Procedure Laterality Date  . COLONOSCOPY W/ BIOPSIES AND POLYPECTOMY  07/2011   Archie Endo 07/27/2011        Home Medications    Prior to Admission medications   Medication Sig Start Date End Date Taking? Authorizing Provider  allopurinol (ZYLOPRIM) 300 MG tablet Take 1 tablet (300 mg total) by mouth daily. 09/13/17   [name], NP  atorvastatin (LIPITOR) 10 MG tablet Take 1 tablet (10 mg total) by mouth daily. Take 1/2 tablet (5 mg) for one week then increase to whole tablet daily. 09/13/17   [name], NP  colchicine 0.6 MG tablet Take 1 tablet (0.6 mg total) by mouth daily. 03/25/17   Gildardo Cranker, DO  diclofenac sodium (VOLTAREN) 1 % GEL Apply 2 g topically 4 (four) times daily. 09/08/17   Duffy Bruce, MD  furosemide (LASIX) 20 MG tablet Take 2 tablets (40 mg total) by mouth daily. 03/01/16   [name], NP  lisinopril (PRINIVIL,ZESTRIL) 2.5 MG tablet Take 1 tablet (2.5 mg total) by mouth daily. 03/28/17   Gildardo Cranker, DO  nystatin-triamcinolone ointment [facility]) Apply 1 application topically 2 (two) times daily. 11/29/16   [name], NP    Family History Family History  Problem Relation Age of Onset  . Colon cancer Maternal Grandfather 55  .  Heart attack Father   . Stomach cancer Maternal Aunt 80    Social History Social History   Tobacco Use  . Smoking status: Former Smoker    Last attempt to quit: 02/09/1982    Years since quitting: 35.7  . Smokeless tobacco: Never Used  Substance Use Topics  . Alcohol use: No  . Drug use: Yes    Comment: 04/19/2017 "nothing since I was 21"     Allergies   Uloric [febuxostat]   Review of Systems Review of Systems  Constitutional: Negative for chills and fever.  Respiratory: Negative for cough, chest tightness and shortness of breath.   Cardiovascular: Positive for leg swelling. Negative for chest pain  and palpitations.  Gastrointestinal: Negative for abdominal distention, abdominal pain, diarrhea, nausea and vomiting.  Genitourinary: Negative for dysuria, frequency, hematuria and urgency.  Musculoskeletal: Positive for arthralgias and myalgias. Negative for neck pain and neck stiffness.  Skin: Negative for rash.  Allergic/Immunologic: Negative for immunocompromised state.  Neurological: Negative for dizziness, weakness, light-headedness, numbness and headaches.  All other systems reviewed and are negative.    Physical Exam Updated Vital Signs BP 134/65   Pulse 86   Temp 97.7 F (36.5 C) (Oral)   Resp 20   Wt (!) 207.7 kg (458 lb)   SpO2 98%   BMI 71.73 kg/m   Physical Exam  Constitutional: He appears well-developed and well-nourished. No distress.  HENT:  Head: Normocephalic and atraumatic.  Eyes: Conjunctivae are normal.  Neck: Neck supple.  Cardiovascular: Normal rate, regular rhythm and normal heart sounds.  Pulmonary/Chest: Effort normal. No respiratory distress. He has no wheezes. He has no rales.  Abdominal: Soft. Bowel sounds are normal. He exhibits no distension. There is no tenderness. There is no rebound.  Musculoskeletal: He exhibits edema.  Nonpitting peripheral edema in bilateral extremities.  There is obvious swelling to the right dorsal hand with mild erythema, tender to the touch.  There is diffuse tenderness of her right shoulder joint.  Pain with range of motion of the shoulder.  Patient with chronic deformities to bilateral hands from arthritis.  Neurological: He is alert.  Skin: Skin is warm and dry.  Nursing note and vitals reviewed.    ED Treatments / Results  Labs (all labs ordered are listed, but only abnormal results are displayed) Labs Reviewed - No data to display  EKG None  Radiology No results found.  Procedures Procedures (including critical care time)  Medications Ordered in ED Medications  oxyCODONE-acetaminophen  (PERCOCET/ROXICET) 5-325 MG per tablet 2 tablet (has no administration in time range)  furosemide (LASIX) injection 60 mg (has no administration in time range)     Initial Impression / Assessment and Plan / ED Course  I have reviewed the triage vital signs and the nursing notes.  Pertinent labs & imaging results that were available during my care of the patient were reviewed by me and considered in my medical decision making (see chart for details).     Patient with I think 2 separate complaints, one is that he is having an acute gout exacerbation in the right hand or shoulder.  Will treat with pain medications.  He is already on colchicine and allopurinol.  Patient is also complaining of peripheral edema in legs and arms.  He is morbidly obese, and I am not sure what his legs look like normally.  He does not have any pitting edema.  Will check basic labs, to assess for renal function, electrolytes, will check BNP,  get chest x-ray.  I will order him a dose of Lasix here through IV.  Sounds like he is noncompliant with his Lasix at home.  4:17 PM Labs unremarkable.  BNP is negative.  No fluid overload on chest x-ray.  Patient is diuresing here after receiving IV Lasix.  Do not think she needs any admission today, his vital signs are normal.  We discussed starting him again on prednisone for his gout and given pain medications.  Patient will be discharged home with close outpatient follow-up.  Return precautions discussed   Vitals:   11/15/17 1029 11/15/17 1110 11/15/17 1320 11/15/17 1602  BP: 134/65  124/63 (!) 143/79  Pulse: 86  85 85  Resp: 20  (!) 22 20  Temp: 97.7 F (36.5 C)     TempSrc: Oral     SpO2: 98%  100% 97%  Weight:  (!) 207.7 kg (458 lb)      Final Clinical Impressions(s) / ED Diagnoses   Final diagnoses:  Peripheral edema  Acute drug-induced gout of right hand    ED Discharge Orders    None       Jeannett Senior, PA-C 11/15/17 1639    Daleen Bo,  MD 11/15/17 1902

## 2017-11-15 NOTE — ED Notes (Signed)
Only PO ice chips--per PA Tatyana

## 2017-11-15 NOTE — Discharge Instructions (Signed)
Take prednisone for your gout as prescribed. Take percocet for pain as prescribed as needed. Take your lasix to help with swelling. Follow up with your doctor as soon as able.

## 2017-11-15 NOTE — ED Notes (Signed)
Per [facility], pt is ambulatory, as was seen by PTAR ambulating to stretcher this morning to be transported this morning. ED staff unaware pt was ambulatory.

## 2017-11-15 NOTE — ED Notes (Signed)
IV team at bedside 

## 2017-11-15 NOTE — ED Notes (Signed)
PT[facility] called by Ambrose Pancoast, per RN Marzetta Board

## 2017-11-15 NOTE — ED Triage Notes (Signed)
Patient presented to ed with c/o leg swelling and gout. Patient have fluids build up in the last couple of days. No hx of CHF per patient . Right forearm and bilateral feet swelling. Patient state that he take lasix but it does not help.

## 2017-11-15 NOTE — ED Notes (Signed)
Bed: WA08 Expected date:  Expected time:  Means of arrival:  Comments: EMS-edema 

## 2017-11-16 ENCOUNTER — Telehealth: Payer: Self-pay

## 2017-11-16 NOTE — Telephone Encounter (Signed)
Transition Care Management Follow-Up Telephone Call   Date discharged and where: [facility] on 11/15/2017  How have you been since you were released from the hospital? Can lift up arm and swelling is down. Has his feet elevated and there is no more pain due to Percocet.   Any patient concerns? None, once fluid was taken off he said he felt better. Would like to discuss with PCP about home health for help with chores, etc.  Items Reviewed:   Meds: Yes  Allergies: Yes  Dietary Changes Reviewed: Yes  Functional Questionnaire:  Independent-I Dependent-D  ADLs:   Dressing- I    Eating- I   Maintaining continence- I   Transferring- I with cane to help with balance   Transportation- D   Meal Prep- I   Managing Meds- I  Confirmed importance and Date/Time of follow-up visits scheduled: Yes, 11/24/2017 @ 1pm with Sherrie Mustache, NP   Confirmed with patient if condition worsens to call PCP or go to the Emergency Dept. Patient was given office number and encouraged to call back with questions or concerns: Yes

## 2017-11-16 NOTE — Telephone Encounter (Signed)
I have made the 1st attempt to contact the patient or family member in charge, in order to follow up from recently being discharged from the hospital. I left a message on voicemail of cellphone but I will make another attempt at a different time. Home phone did not connect.

## 2017-11-17 LAB — URINE CULTURE: Culture: >=100000 — AB

## 2017-11-18 ENCOUNTER — Telehealth: Payer: Self-pay | Admitting: *Deleted

## 2017-11-18 NOTE — Telephone Encounter (Signed)
Post ED Visit - Positive Culture Follow-up  Culture report reviewed by antimicrobial stewardship pharmacist:  []  Elenor Quinones, Pharm.D. []  Heide Guile, Pharm.D., BCPS AQ-ID []  Parks Neptune, Pharm.D., BCPS []  Alycia Rossetti, Pharm.D., BCPS []  [place], Pharm.D., BCPS, AAHIVP []  Legrand Como, Pharm.D., BCPS, AAHIVP []  Salome Arnt, PharmD, BCPS [x]  Wynell Balloon, PharmD []  Vincenza Hews, PharmD, BCPS  Positive urine culture Asymptomatic bacteriuria and no further patient follow-up is required at this time.  Harlon Flor [facility] 11/18/2017, 11:13 AM

## 2017-11-24 ENCOUNTER — Ambulatory Visit (INDEPENDENT_AMBULATORY_CARE_PROVIDER_SITE_OTHER): Payer: Medicare HMO | Admitting: Nurse Practitioner

## 2017-11-24 ENCOUNTER — Encounter: Payer: Self-pay | Admitting: Nurse Practitioner

## 2017-11-24 VITALS — BP 146/84 | HR 93 | Temp 97.9°F | Ht 67.0 in | Wt >= 6400 oz

## 2017-11-24 DIAGNOSIS — M1A9XX1 Chronic gout, unspecified, with tophus (tophi): Secondary | ICD-10-CM | POA: Diagnosis not present

## 2017-11-24 DIAGNOSIS — R600 Localized edema: Secondary | ICD-10-CM | POA: Diagnosis not present

## 2017-11-24 DIAGNOSIS — E785 Hyperlipidemia, unspecified: Secondary | ICD-10-CM

## 2017-11-24 MED ORDER — ATORVASTATIN CALCIUM 10 MG PO TABS: 10.0000 mg | ORAL_TABLET | Freq: Every day | ORAL | 1 refills | Status: DC

## 2017-11-24 NOTE — Patient Instructions (Addendum)
Make sure to be taking your medication as prescribed   To keep lab appt as schedule To change follow up to 3 months with Dr Eulas Post   Low-Purine Diet-- for GOUT Purines are compounds that affect the level of uric acid in your body. A low-purine diet is a diet that is low in purines. Eating a low-purine diet can prevent the level of uric acid in your body from getting too high and causing gout or kidney stones or both. What do I need to know about this diet?  Choose low-purine foods. Examples of low-purine foods are listed in the next section.  Drink plenty of fluids, especially water. Fluids can help remove uric acid from your body. Try to drink 8-16 cups (1.9-3.8 L) a day.  Limit foods high in fat, especially saturated fat, as fat makes it harder for the body to get rid of uric acid. Foods high in saturated fat include pizza, cheese, ice cream, whole milk, fried foods, and gravies. Choose foods that are lower in fat and lean sources of protein. Use olive oil when cooking as it contains healthy fats that are not high in saturated fat.  Limit alcohol. Alcohol interferes with the elimination of uric acid from your body. If you are having a gout attack, avoid all alcohol.  Keep in mind that different people's bodies react differently to different foods. You will probably learn over time which foods do or do not affect you. If you discover that a food tends to cause your gout to flare up, avoid eating that food. You can more freely enjoy foods that do not cause problems. If you have any questions about a food item, talk to your dietitian or health care provider. Which foods are low, moderate, and high in purines? The following is a list of foods that are low, moderate, and high in purines. You can eat any amount of the foods that are low in purines. You may be able to have small amounts of foods that are moderate in purines. Ask your health care provider how much of a food moderate in purines you can  have. Avoid foods high in purines. Grains  Foods low in purines: Enriched white bread, pasta, rice, cake, cornbread, popcorn.  Foods moderate in purines: Whole-grain breads and cereals, wheat germ, bran, oatmeal. Uncooked oatmeal. Dry wheat bran or wheat germ.  Foods high in purines: Pancakes, [place] toast, biscuits, muffins. Vegetables  Foods low in purines: All vegetables, except those that are moderate in purines.  Foods moderate in purines: Asparagus, cauliflower, spinach, mushrooms, green peas. Fruits  All fruits are low in purines. Meats and other Protein Foods  Foods low in purines: Eggs, nuts, peanut butter.  Foods moderate in purines: 80-90% lean beef, lamb, veal, pork, poultry, fish, eggs, peanut butter, nuts. Crab, lobster, oysters, and shrimp. Cooked dried beans, peas, and lentils.  Foods high in purines: Anchovies, sardines, herring, mussels, tuna, codfish, scallops, trout, and haddock. Berniece Salines. Organ meats (such as liver or kidney). Tripe. Game meat. Goose. Sweetbreads. Dairy  All dairy foods are low in purines. Low-fat and fat-free dairy products are best because they are low in saturated fat. Beverages  Drinks low in purines: Water, carbonated beverages, tea, coffee, cocoa.  Drinks moderate in purines: Soft drinks and other drinks sweetened with high-fructose corn syrup. Juices. To find whether a food or drink is sweetened with high-fructose corn syrup, look at the ingredients list.  Drinks high in purines: Alcoholic beverages (such as beer). Condiments  Foods low in purines: Salt, herbs, olives, pickles, relishes, vinegar.  Foods moderate in purines: Butter, margarine, oils, mayonnaise. Fats and Oils  Foods low in purines: All types, except gravies and sauces made with meat.  Foods high in purines: Gravies and sauces made with meat. Other Foods  Foods low in purines: Sugars, sweets, gelatin. Cake. Soups made without meat.  Foods moderate in purines:  Meat-based or fish-based soups, broths, or bouillons. Foods and drinks sweetened with high-fructose corn syrup.  Foods high in purines: High-fat desserts (such as ice cream, cookies, cakes, pies, doughnuts, and chocolate). Contact your dietitian for more information on foods that are not listed here. This information is not intended to replace advice given to you by your health care provider. Make sure you discuss any questions you have with your health care provider. Document Released: 10/02/2010 Document Revised: 11/13/2015 Document Reviewed: 05/14/2013 Elsevier Interactive Patient Education  2017 [employer].

## 2017-11-24 NOTE — Progress Notes (Signed)
Careteam: [name], NP as PCP - General (Nurse Practitioner)  Advanced Directive information    Allergies  Allergen Reactions  . Uloric [Febuxostat] Other (See Comments)    Made the skin on feet and legs "hurt"    Chief Complaint  Patient presents with  . Transitions Of Care    ED visit 5/29. Gout episode on right hand. Has had some swelling in feet. Having a hard time washing himself. Wants to get some home care initiated.      HPI: Patient is a 57 y.o. male seen in the office today for follow up ED visit. Pt with history of morbid obesity, hypertension, diabetes, history of pulmonary embolism not anticoagulated at this time, gout, presents to emergency department complaining of gout exacerbation the right hand and right shoulder, as well as peripheral edema.  He had been taking the colchicine and allopurinol routinely for gout and lasix 40 mg daily for swelling (he does not take this routinely due to cramping)  He was given IV lasix and had significant diureses. States the swelling has improved.  Was given prednisone taper to help gout exacerbation and this has improved.  Pt has been noncompliant with allopurinol - admits to skipping doses   Hyperlipidemia- pt thought he was taken off lipitor but he is currently taking.   Would like someone to provide personal care and house hold care due to medical conditions and severe gout which limits his mobility and ROM.    Review of Systems:  Review of Systems  Constitutional: Negative for chills, fever and weight loss.  Respiratory: Negative for cough, sputum production and shortness of breath.   Cardiovascular: Positive for leg swelling. Negative for chest pain and palpitations.  Gastrointestinal: Negative for abdominal pain, constipation, diarrhea and heartburn.  Genitourinary: Negative for dysuria, frequency and urgency.  Musculoskeletal: Positive for joint pain and myalgias. Negative for back pain  and falls.  Neurological: Negative for dizziness and headaches.  Psychiatric/Behavioral: Negative for depression and memory loss. The patient does not have insomnia.     Past Medical History:  Diagnosis Date  . Arthritis    "knees, hands" (04/19/2017)  . Childhood asthma   . Diabetes type 2, uncontrolled ([place])   . Gout    "have had it in both feet, both hands, right shoulder, back" (04/19/2017)  . Hyperlipidemia   . Hypertension   . Morbidly obese ([place])   . Pulmonary embolism ([place]) 2017?   Past Surgical History:  Procedure Laterality Date  . COLONOSCOPY W/ BIOPSIES AND POLYPECTOMY  07/2011   Archie Endo 07/27/2011   Social History:   reports that he quit smoking about 35 years ago. He has never used smokeless tobacco. He reports that he has current or past drug history. He reports that he does not drink alcohol.  Family History  Problem Relation Age of Onset  . Colon cancer Maternal Grandfather 4  . Heart attack Father   . Stomach cancer Maternal Aunt 80    Medications: Patient's Medications  New Prescriptions   No medications on file  Previous Medications   ACETAMINOPHEN (TYLENOL) 500 MG TABLET    Take 1,000-1,500 mg by mouth 2 (two) times daily as needed for mild pain.   ALBUTEROL (PROVENTIL HFA;VENTOLIN HFA) 108 (90 BASE) MCG/ACT INHALER    Inhale 1-2 puffs into the lungs every 6 (six) hours as needed for wheezing or shortness of breath.   ALLOPURINOL (ZYLOPRIM) 300 MG TABLET    Take 1  tablet (300 mg total) by mouth daily.   COLCHICINE 0.6 MG TABLET    Take 1 tablet (0.6 mg total) by mouth daily.   DICLOFENAC SODIUM (VOLTAREN) 1 % GEL    Apply 2 g topically 4 (four) times daily.   FUROSEMIDE (LASIX) 20 MG TABLET    Take 2 tablets (40 mg total) by mouth daily.   LISINOPRIL (PRINIVIL,ZESTRIL) 2.5 MG TABLET    Take 1 tablet (2.5 mg total) by mouth daily.   NYSTATIN-TRIAMCINOLONE OINTMENT (MYCOLOG)    Apply 1 application topically 2 (two) times daily.   OXYCODONE-ACETAMINOPHEN  (PERCOCET) 5-325 MG TABLET    Take 1 tablet by mouth every 4 (four) hours as needed for severe pain.   PREDNISONE (DELTASONE) 20 MG TABLET    Take 2 tablets (40 mg total) by mouth daily.   TROLAMINE SALICYLATE (ASPERCREME) 10 % CREAM    Apply 1 application topically 2 (two) times daily as needed for muscle pain.  Modified Medications   No medications on file  Discontinued Medications   ATORVASTATIN (LIPITOR) 10 MG TABLET    Take 1 tablet (10 mg total) by mouth daily. Take 1/2 tablet (5 mg) for one week then increase to whole tablet daily.     Physical Exam:  Vitals:   11/24/17 1252  BP: (!) 146/84  Pulse: 93  Temp: 97.9 F (36.6 C)  SpO2: 98%  Weight: (!) 454 lb 6.4 oz (206.1 kg)  Height: 5\' 7"  (1.702 m)   Body mass index is 71.17 kg/m.  Physical Exam  Constitutional: He is oriented to person, place, and time. He appears well-developed and well-nourished.  HENT:  Mouth/Throat: Oropharynx is clear and moist.  Eyes: Pupils are equal, round, and reactive to light. No scleral icterus.  Neck: Neck supple. Muscular tenderness present. Carotid bruit is not present. Decreased range of motion present. No thyromegaly present.  Cardiovascular: Normal rate and regular rhythm. Exam reveals no gallop and no friction rub.  Murmur (1/6 SEM) heard. Pulmonary/Chest: Effort normal and breath sounds normal. He has no wheezes. He has no rales. He exhibits no tenderness.  Abdominal: Soft. Normal appearance and bowel sounds are normal. There is no hepatomegaly.  obese  Musculoskeletal: He exhibits edema (small and large joints and leg), tenderness and deformity (in small joints to bilateral hands).  Lymphadenopathy:    He has no cervical adenopathy.  Neurological: He is alert and oriented to person, place, and time.  Skin: Skin is warm and dry.  Psychiatric: He has a normal mood and affect. His behavior is normal. Judgment and thought content normal.   Labs reviewed: Basic Metabolic  Panel: Recent Labs    04/27/17 0334 09/12/17 1137 11/15/17 1349  NA 134* 140 139  K 4.1 4.4 3.9  CL 100* 107 102  CO2 26 25 26   GLUCOSE 109* 79 111*  BUN 28* 27* 19  CREATININE 1.16 1.26 0.99  CALCIUM 8.7* 8.9 8.9   Liver Function Tests: Recent Labs    03/23/17 1012 09/12/17 1137  AST 17 32  ALT 15 43  BILITOT 0.3 0.4  PROT 6.5 6.8   No results for input(s): LIPASE, AMYLASE in the last 8760 hours. No results for input(s): AMMONIA in the last 8760 hours. CBC: Recent Labs    04/27/17 0334 09/12/17 1137 11/15/17 1349  WBC 11.8* 7.7 6.7  NEUTROABS 6.3 3,457 3.8  HGB 14.9 14.7 13.3  HCT 44.8 43.8 40.5  MCV 93.3 91.3 95.3  PLT 298 223 233  Lipid Panel: Recent Labs    03/23/17 1012 09/12/17 1137  CHOL 164 180  HDL 52 41  LDLCALC 94 118*  TRIG 85 107  CHOLHDL 3.2 4.4   TSH: No results for input(s): TSH in the last 8760 hours. A1C: Lab Results  Component Value Date   HGBA1C 5.7 (H) 09/12/2017     Assessment/Plan 1. Hyperlipidemia, unspecified hyperlipidemia type - atorvastatin (LIPITOR) 10 MG tablet; Take 1 tablet (10 mg total) by mouth daily at 6 PM.  Dispense: 90 tablet; Refill: 1 - Lipid Panel; Future - CMP; Future  2. Bilateral edema of lower extremity Improved since being in the ED with IV lasix, encouraged compliance with lasix   3. Gout with tophi -not taking medication daily just went having flares, uric acid level was elevated in ED, allopurinol and colchicine were restarted. Expressed importance in compliance with taking medication routinely to prevent recurrent flares. Diet education and information also given.  - Ambulatory referral to Home Health - Uric acid; Future  Next appt: 12/12/2017 for lab work and follow up in 3 months for routine visit Krysten Veronica K. [place], [facility] [phone]

## 2017-11-26 DIAGNOSIS — M25569 Pain in unspecified knee: Secondary | ICD-10-CM | POA: Diagnosis not present

## 2017-11-26 DIAGNOSIS — R269 Unspecified abnormalities of gait and mobility: Secondary | ICD-10-CM | POA: Diagnosis not present

## 2017-12-01 ENCOUNTER — Telehealth: Payer: Self-pay

## 2017-12-01 NOTE — Telephone Encounter (Signed)
Patient called to inform our office that a [place] agency (patient could not recall name of agency) called him to confirm that they will be coming out soon to do in home physical therapy.  S.Chrae B/CMA

## 2017-12-02 ENCOUNTER — Telehealth: Payer: Self-pay

## 2017-12-02 DIAGNOSIS — R262 Difficulty in walking, not elsewhere classified: Secondary | ICD-10-CM | POA: Diagnosis not present

## 2017-12-02 DIAGNOSIS — E119 Type 2 diabetes mellitus without complications: Secondary | ICD-10-CM | POA: Diagnosis not present

## 2017-12-02 DIAGNOSIS — I10 Essential (primary) hypertension: Secondary | ICD-10-CM | POA: Diagnosis not present

## 2017-12-02 DIAGNOSIS — Z87891 Personal history of nicotine dependence: Secondary | ICD-10-CM

## 2017-12-02 DIAGNOSIS — M170 Bilateral primary osteoarthritis of knee: Secondary | ICD-10-CM

## 2017-12-02 DIAGNOSIS — J45909 Unspecified asthma, uncomplicated: Secondary | ICD-10-CM | POA: Diagnosis not present

## 2017-12-02 DIAGNOSIS — M1A9XX1 Chronic gout, unspecified, with tophus (tophi): Secondary | ICD-10-CM | POA: Diagnosis not present

## 2017-12-02 NOTE — Telephone Encounter (Signed)
Noted thank you

## 2017-12-02 NOTE — Telephone Encounter (Signed)
Cecille Rubin with [facility] called requesting verbal order for social worker evaluation   Per [facility] standing order, verbal order given. Message will be sent to patient's provider as a FYI.

## 2017-12-05 ENCOUNTER — Telehealth: Payer: Self-pay | Admitting: *Deleted

## 2017-12-05 NOTE — Telephone Encounter (Signed)
Ronnie Hernandez with [facility] called requesting verbal orders for PT 1X1wk, 2X2wk, 1X1wk. Verbal order given.

## 2017-12-06 DIAGNOSIS — I10 Essential (primary) hypertension: Secondary | ICD-10-CM | POA: Diagnosis not present

## 2017-12-06 DIAGNOSIS — M1A9XX1 Chronic gout, unspecified, with tophus (tophi): Secondary | ICD-10-CM | POA: Diagnosis not present

## 2017-12-06 DIAGNOSIS — R262 Difficulty in walking, not elsewhere classified: Secondary | ICD-10-CM | POA: Diagnosis not present

## 2017-12-07 DIAGNOSIS — R262 Difficulty in walking, not elsewhere classified: Secondary | ICD-10-CM | POA: Diagnosis not present

## 2017-12-07 DIAGNOSIS — I10 Essential (primary) hypertension: Secondary | ICD-10-CM | POA: Diagnosis not present

## 2017-12-07 DIAGNOSIS — M1A9XX1 Chronic gout, unspecified, with tophus (tophi): Secondary | ICD-10-CM | POA: Diagnosis not present

## 2017-12-08 DIAGNOSIS — I10 Essential (primary) hypertension: Secondary | ICD-10-CM | POA: Diagnosis not present

## 2017-12-08 DIAGNOSIS — M1A9XX1 Chronic gout, unspecified, with tophus (tophi): Secondary | ICD-10-CM | POA: Diagnosis not present

## 2017-12-08 DIAGNOSIS — R262 Difficulty in walking, not elsewhere classified: Secondary | ICD-10-CM | POA: Diagnosis not present

## 2017-12-12 ENCOUNTER — Other Ambulatory Visit: Payer: Medicare HMO

## 2017-12-12 DIAGNOSIS — E785 Hyperlipidemia, unspecified: Secondary | ICD-10-CM

## 2017-12-12 DIAGNOSIS — M1A9XX1 Chronic gout, unspecified, with tophus (tophi): Secondary | ICD-10-CM | POA: Diagnosis not present

## 2017-12-13 DIAGNOSIS — I10 Essential (primary) hypertension: Secondary | ICD-10-CM | POA: Diagnosis not present

## 2017-12-13 DIAGNOSIS — M1A9XX1 Chronic gout, unspecified, with tophus (tophi): Secondary | ICD-10-CM | POA: Diagnosis not present

## 2017-12-13 DIAGNOSIS — R262 Difficulty in walking, not elsewhere classified: Secondary | ICD-10-CM | POA: Diagnosis not present

## 2017-12-14 DIAGNOSIS — I10 Essential (primary) hypertension: Secondary | ICD-10-CM | POA: Diagnosis not present

## 2017-12-14 DIAGNOSIS — R262 Difficulty in walking, not elsewhere classified: Secondary | ICD-10-CM | POA: Diagnosis not present

## 2017-12-14 DIAGNOSIS — M1A9XX1 Chronic gout, unspecified, with tophus (tophi): Secondary | ICD-10-CM | POA: Diagnosis not present

## 2017-12-14 LAB — COMPREHENSIVE METABOLIC PANEL
AG Ratio: 1.3 (calc) (ref 1.0–2.5)
ALKALINE PHOSPHATASE (APISO): 107 U/L (ref 40–115)
ALT: 23 U/L (ref 9–46)
AST: 20 U/L (ref 10–35)
Albumin: 3.7 g/dL (ref 3.6–5.1)
BUN: 19 mg/dL (ref 7–25)
CO2: 24 mmol/L (ref 20–32)
CREATININE: 1.1 mg/dL (ref 0.70–1.33)
Calcium: 9.2 mg/dL (ref 8.6–10.3)
Chloride: 105 mmol/L (ref 98–110)
Globulin: 2.8 g/dL (calc) (ref 1.9–3.7)
Glucose, Bld: 112 mg/dL — ABNORMAL HIGH (ref 65–99)
Potassium: 4.1 mmol/L (ref 3.5–5.3)
Sodium: 139 mmol/L (ref 135–146)
Total Bilirubin: 0.5 mg/dL (ref 0.2–1.2)
Total Protein: 6.5 g/dL (ref 6.1–8.1)

## 2017-12-14 LAB — TEST AUTHORIZATION

## 2017-12-14 LAB — LIPID PANEL
CHOL/HDL RATIO: 3.2 (calc) (ref <5.0)
CHOLESTEROL: 144 mg/dL (ref <200)
HDL: 45 mg/dL (ref >40)
LDL CHOLESTEROL (CALC): 82 mg/dL
Non-HDL Cholesterol (Calc): 99 mg/dL (calc) (ref <130)
Triglycerides: 85 mg/dL (ref <150)

## 2017-12-14 LAB — URIC ACID: Uric Acid, Serum: 9 mg/dL — ABNORMAL HIGH (ref 4.0–8.0)

## 2017-12-15 DIAGNOSIS — R262 Difficulty in walking, not elsewhere classified: Secondary | ICD-10-CM | POA: Diagnosis not present

## 2017-12-15 DIAGNOSIS — M1A9XX1 Chronic gout, unspecified, with tophus (tophi): Secondary | ICD-10-CM | POA: Diagnosis not present

## 2017-12-15 DIAGNOSIS — I10 Essential (primary) hypertension: Secondary | ICD-10-CM | POA: Diagnosis not present

## 2017-12-16 ENCOUNTER — Ambulatory Visit: Payer: Medicare HMO | Admitting: Nurse Practitioner

## 2017-12-16 DIAGNOSIS — M1A9XX1 Chronic gout, unspecified, with tophus (tophi): Secondary | ICD-10-CM | POA: Diagnosis not present

## 2017-12-16 DIAGNOSIS — R262 Difficulty in walking, not elsewhere classified: Secondary | ICD-10-CM | POA: Diagnosis not present

## 2017-12-16 DIAGNOSIS — I10 Essential (primary) hypertension: Secondary | ICD-10-CM | POA: Diagnosis not present

## 2017-12-17 ENCOUNTER — Other Ambulatory Visit: Payer: Self-pay | Admitting: Nurse Practitioner

## 2017-12-20 DIAGNOSIS — M1A9XX1 Chronic gout, unspecified, with tophus (tophi): Secondary | ICD-10-CM | POA: Diagnosis not present

## 2017-12-20 DIAGNOSIS — R262 Difficulty in walking, not elsewhere classified: Secondary | ICD-10-CM | POA: Diagnosis not present

## 2017-12-20 DIAGNOSIS — I10 Essential (primary) hypertension: Secondary | ICD-10-CM | POA: Diagnosis not present

## 2017-12-21 DIAGNOSIS — R262 Difficulty in walking, not elsewhere classified: Secondary | ICD-10-CM | POA: Diagnosis not present

## 2017-12-21 DIAGNOSIS — I10 Essential (primary) hypertension: Secondary | ICD-10-CM | POA: Diagnosis not present

## 2017-12-21 DIAGNOSIS — M1A9XX1 Chronic gout, unspecified, with tophus (tophi): Secondary | ICD-10-CM | POA: Diagnosis not present

## 2017-12-25 ENCOUNTER — Other Ambulatory Visit: Payer: Self-pay | Admitting: Nurse Practitioner

## 2017-12-25 DIAGNOSIS — R600 Localized edema: Secondary | ICD-10-CM

## 2017-12-26 DIAGNOSIS — M1A9XX1 Chronic gout, unspecified, with tophus (tophi): Secondary | ICD-10-CM | POA: Diagnosis not present

## 2017-12-26 DIAGNOSIS — M25569 Pain in unspecified knee: Secondary | ICD-10-CM | POA: Diagnosis not present

## 2017-12-26 DIAGNOSIS — I10 Essential (primary) hypertension: Secondary | ICD-10-CM | POA: Diagnosis not present

## 2017-12-26 DIAGNOSIS — R269 Unspecified abnormalities of gait and mobility: Secondary | ICD-10-CM | POA: Diagnosis not present

## 2017-12-26 DIAGNOSIS — R262 Difficulty in walking, not elsewhere classified: Secondary | ICD-10-CM | POA: Diagnosis not present

## 2017-12-29 DIAGNOSIS — R262 Difficulty in walking, not elsewhere classified: Secondary | ICD-10-CM | POA: Diagnosis not present

## 2017-12-29 DIAGNOSIS — M1A9XX1 Chronic gout, unspecified, with tophus (tophi): Secondary | ICD-10-CM | POA: Diagnosis not present

## 2017-12-29 DIAGNOSIS — I10 Essential (primary) hypertension: Secondary | ICD-10-CM | POA: Diagnosis not present

## 2017-12-30 DIAGNOSIS — M1A9XX1 Chronic gout, unspecified, with tophus (tophi): Secondary | ICD-10-CM | POA: Diagnosis not present

## 2017-12-30 DIAGNOSIS — R262 Difficulty in walking, not elsewhere classified: Secondary | ICD-10-CM | POA: Diagnosis not present

## 2017-12-30 DIAGNOSIS — I10 Essential (primary) hypertension: Secondary | ICD-10-CM | POA: Diagnosis not present

## 2018-01-05 DIAGNOSIS — I10 Essential (primary) hypertension: Secondary | ICD-10-CM | POA: Diagnosis not present

## 2018-01-05 DIAGNOSIS — R262 Difficulty in walking, not elsewhere classified: Secondary | ICD-10-CM | POA: Diagnosis not present

## 2018-01-05 DIAGNOSIS — M1A9XX1 Chronic gout, unspecified, with tophus (tophi): Secondary | ICD-10-CM | POA: Diagnosis not present

## 2018-01-06 DIAGNOSIS — M1A9XX1 Chronic gout, unspecified, with tophus (tophi): Secondary | ICD-10-CM | POA: Diagnosis not present

## 2018-01-06 DIAGNOSIS — I10 Essential (primary) hypertension: Secondary | ICD-10-CM | POA: Diagnosis not present

## 2018-01-06 DIAGNOSIS — R262 Difficulty in walking, not elsewhere classified: Secondary | ICD-10-CM | POA: Diagnosis not present

## 2018-01-26 DIAGNOSIS — M25569 Pain in unspecified knee: Secondary | ICD-10-CM | POA: Diagnosis not present

## 2018-01-26 DIAGNOSIS — R269 Unspecified abnormalities of gait and mobility: Secondary | ICD-10-CM | POA: Diagnosis not present

## 2018-02-17 ENCOUNTER — Other Ambulatory Visit: Payer: Self-pay | Admitting: Nurse Practitioner

## 2018-02-17 ENCOUNTER — Other Ambulatory Visit: Payer: Self-pay | Admitting: Internal Medicine

## 2018-02-17 DIAGNOSIS — R600 Localized edema: Secondary | ICD-10-CM

## 2018-02-17 DIAGNOSIS — M10042 Idiopathic gout, left hand: Secondary | ICD-10-CM

## 2018-02-26 DIAGNOSIS — R269 Unspecified abnormalities of gait and mobility: Secondary | ICD-10-CM | POA: Diagnosis not present

## 2018-02-26 DIAGNOSIS — M25569 Pain in unspecified knee: Secondary | ICD-10-CM | POA: Diagnosis not present

## 2018-03-01 ENCOUNTER — Ambulatory Visit: Payer: Medicare HMO | Admitting: Internal Medicine

## 2018-03-18 ENCOUNTER — Other Ambulatory Visit: Payer: Self-pay | Admitting: Nurse Practitioner

## 2018-03-31 ENCOUNTER — Other Ambulatory Visit: Payer: Self-pay

## 2018-03-31 ENCOUNTER — Encounter (HOSPITAL_COMMUNITY): Payer: Self-pay | Admitting: *Deleted

## 2018-03-31 ENCOUNTER — Emergency Department (HOSPITAL_COMMUNITY): Payer: Medicare HMO

## 2018-03-31 ENCOUNTER — Ambulatory Visit (HOSPITAL_BASED_OUTPATIENT_CLINIC_OR_DEPARTMENT_OTHER): Payer: Medicare HMO

## 2018-03-31 ENCOUNTER — Observation Stay (HOSPITAL_COMMUNITY)
Admission: EM | Admit: 2018-03-31 | Discharge: 2018-04-04 | Disposition: A | Discharge status: Home / Self Care | Payer: Medicare HMO | Attending: Internal Medicine | Admitting: Internal Medicine

## 2018-03-31 DIAGNOSIS — Z87891 Personal history of nicotine dependence: Secondary | ICD-10-CM | POA: Insufficient documentation

## 2018-03-31 DIAGNOSIS — I110 Hypertensive heart disease with heart failure: Secondary | ICD-10-CM | POA: Insufficient documentation

## 2018-03-31 DIAGNOSIS — M79609 Pain in unspecified limb: Secondary | ICD-10-CM | POA: Diagnosis not present

## 2018-03-31 DIAGNOSIS — M109 Gout, unspecified: Secondary | ICD-10-CM | POA: Diagnosis not present

## 2018-03-31 DIAGNOSIS — M7989 Other specified soft tissue disorders: Secondary | ICD-10-CM | POA: Diagnosis not present

## 2018-03-31 DIAGNOSIS — Z23 Encounter for immunization: Secondary | ICD-10-CM | POA: Insufficient documentation

## 2018-03-31 DIAGNOSIS — E785 Hyperlipidemia, unspecified: Secondary | ICD-10-CM | POA: Insufficient documentation

## 2018-03-31 DIAGNOSIS — E119 Type 2 diabetes mellitus without complications: Secondary | ICD-10-CM | POA: Diagnosis not present

## 2018-03-31 DIAGNOSIS — Z86711 Personal history of pulmonary embolism: Secondary | ICD-10-CM | POA: Diagnosis not present

## 2018-03-31 DIAGNOSIS — Z7952 Long term (current) use of systemic steroids: Secondary | ICD-10-CM | POA: Insufficient documentation

## 2018-03-31 DIAGNOSIS — Z8249 Family history of ischemic heart disease and other diseases of the circulatory system: Secondary | ICD-10-CM | POA: Diagnosis not present

## 2018-03-31 DIAGNOSIS — M25462 Effusion, left knee: Secondary | ICD-10-CM | POA: Diagnosis not present

## 2018-03-31 DIAGNOSIS — R5381 Other malaise: Secondary | ICD-10-CM | POA: Diagnosis not present

## 2018-03-31 DIAGNOSIS — Z6841 Body Mass Index (BMI) 40.0 and over, adult: Secondary | ICD-10-CM | POA: Insufficient documentation

## 2018-03-31 DIAGNOSIS — I5032 Chronic diastolic (congestive) heart failure: Secondary | ICD-10-CM | POA: Insufficient documentation

## 2018-03-31 DIAGNOSIS — M10042 Idiopathic gout, left hand: Secondary | ICD-10-CM

## 2018-03-31 DIAGNOSIS — J45909 Unspecified asthma, uncomplicated: Secondary | ICD-10-CM | POA: Diagnosis not present

## 2018-03-31 DIAGNOSIS — M25562 Pain in left knee: Secondary | ICD-10-CM

## 2018-03-31 LAB — CBC WITH DIFFERENTIAL/PLATELET
Abs Immature Granulocytes: 0.03 10*3/uL (ref 0.00–0.07)
BASOS PCT: 0 %
Basophils Absolute: 0 10*3/uL (ref 0.0–0.1)
Eosinophils Absolute: 0.2 10*3/uL (ref 0.0–0.5)
Eosinophils Relative: 3 %
HCT: 39.4 % (ref 39.0–52.0)
Hemoglobin: 12.4 g/dL — ABNORMAL LOW (ref 13.0–17.0)
IMMATURE GRANULOCYTES: 0 %
Lymphocytes Relative: 18 %
Lymphs Abs: 1.6 10*3/uL (ref 0.7–4.0)
MCH: 30.3 pg (ref 26.0–34.0)
MCHC: 31.5 g/dL (ref 30.0–36.0)
MCV: 96.3 fL (ref 80.0–100.0)
MONOS PCT: 9 %
Monocytes Absolute: 0.8 10*3/uL (ref 0.1–1.0)
NEUTROS ABS: 6.3 10*3/uL (ref 1.7–7.7)
NEUTROS PCT: 70 %
PLATELETS: 279 10*3/uL (ref 150–400)
RBC: 4.09 MIL/uL — ABNORMAL LOW (ref 4.22–5.81)
RDW: 13.5 % (ref 11.5–15.5)
WBC: 8.9 10*3/uL (ref 4.0–10.5)
nRBC: 0 % (ref 0.0–0.2)

## 2018-03-31 LAB — BASIC METABOLIC PANEL
ANION GAP: 12 (ref 5–15)
BUN: 20 mg/dL (ref 6–20)
CO2: 26 mmol/L (ref 22–32)
Calcium: 8.9 mg/dL (ref 8.9–10.3)
Chloride: 100 mmol/L (ref 98–111)
Creatinine, Ser: 0.83 mg/dL (ref 0.61–1.24)
GFR calc Af Amer: >60 mL/min (ref >60)
GLUCOSE: 106 mg/dL — AB (ref 70–99)
POTASSIUM: 4 mmol/L (ref 3.5–5.1)
Sodium: 138 mmol/L (ref 135–145)

## 2018-03-31 LAB — BRAIN NATRIURETIC PEPTIDE: B NATRIURETIC PEPTIDE 5: 18.1 pg/mL (ref 0.0–100.0)

## 2018-03-31 LAB — I-STAT TROPONIN, ED: TROPONIN I, POC: 0 ng/mL (ref 0.00–0.08)

## 2018-03-31 MED ORDER — PREDNISONE 20 MG PO TABS: 60.0000 mg | ORAL_TABLET | Freq: Every day | ORAL | 0 refills | Status: DC

## 2018-03-31 MED ORDER — ALBUTEROL SULFATE (2.5 MG/3ML) 0.083% IN NEBU: 2.5000 mg | INHALATION_SOLUTION | Freq: Four times a day (QID) | RESPIRATORY_TRACT | Status: DC | PRN

## 2018-03-31 MED ORDER — PREDNISONE 20 MG PO TABS: 40.0000 mg | ORAL_TABLET | Freq: Every day | ORAL | Status: DC

## 2018-03-31 MED ORDER — OXYCODONE HCL 5 MG PO TABS: 5.0000 mg | ORAL_TABLET | Freq: Four times a day (QID) | ORAL | Status: DC | PRN

## 2018-03-31 MED ORDER — INFLUENZA VAC SPLIT QUAD 0.5 ML IM SUSY
0.5000 mL | PREFILLED_SYRINGE | INTRAMUSCULAR | Status: AC
  Administered 2018-04-01: 0.5 mL via INTRAMUSCULAR
  Filled 2018-03-31: qty 0.5

## 2018-03-31 MED ORDER — SENNA 8.6 MG PO TABS
1.0000 | ORAL_TABLET | Freq: Two times a day (BID) | ORAL | Status: DC
  Administered 2018-04-01 – 2018-04-03 (×3): 8.6 mg via ORAL
  Filled 2018-03-31 (×4): qty 1

## 2018-03-31 MED ORDER — ALBUTEROL SULFATE HFA 108 (90 BASE) MCG/ACT IN AERS: 1.0000 | INHALATION_SPRAY | Freq: Four times a day (QID) | RESPIRATORY_TRACT | Status: DC | PRN

## 2018-03-31 MED ORDER — TROLAMINE SALICYLATE 10 % EX CREA: 1.0000 "application " | TOPICAL_CREAM | Freq: Two times a day (BID) | CUTANEOUS | Status: DC | PRN

## 2018-03-31 MED ORDER — ATORVASTATIN CALCIUM 10 MG PO TABS
10.0000 mg | ORAL_TABLET | Freq: Every day | ORAL | Status: DC
  Administered 2018-04-01 – 2018-04-03 (×3): 10 mg via ORAL
  Filled 2018-03-31 (×3): qty 1

## 2018-03-31 MED ORDER — ALLOPURINOL 300 MG PO TABS
300.0000 mg | ORAL_TABLET | Freq: Every day | ORAL | Status: DC
  Administered 2018-03-31: 300 mg via ORAL
  Filled 2018-03-31: qty 1

## 2018-03-31 MED ORDER — FUROSEMIDE 40 MG PO TABS
40.0000 mg | ORAL_TABLET | Freq: Every day | ORAL | Status: DC
  Administered 2018-03-31 – 2018-04-04 (×5): 40 mg via ORAL
  Filled 2018-03-31 (×5): qty 1

## 2018-03-31 MED ORDER — ACETAMINOPHEN 325 MG PO TABS: 650.0000 mg | ORAL_TABLET | Freq: Three times a day (TID) | ORAL | Status: DC | PRN

## 2018-03-31 MED ORDER — ACETAMINOPHEN ER 650 MG PO TBCR: 650.0000 mg | EXTENDED_RELEASE_TABLET | Freq: Three times a day (TID) | ORAL | Status: DC | PRN

## 2018-03-31 MED ORDER — POLYETHYLENE GLYCOL 3350 17 G PO PACK
17.0000 g | PACK | Freq: Every day | ORAL | Status: DC
  Administered 2018-04-01: 17 g via ORAL
  Filled 2018-03-31: qty 1

## 2018-03-31 MED ORDER — ENOXAPARIN SODIUM 80 MG/0.8ML ~~LOC~~ SOLN
80.0000 mg | SUBCUTANEOUS | Status: DC
  Administered 2018-03-31 – 2018-04-01 (×2): 80 mg via SUBCUTANEOUS
  Filled 2018-03-31 (×3): qty 0.8

## 2018-03-31 MED ORDER — PREDNISONE 20 MG PO TABS
60.0000 mg | ORAL_TABLET | Freq: Once | ORAL | Status: AC
  Administered 2018-03-31: 60 mg via ORAL
  Filled 2018-03-31 (×2): qty 3

## 2018-03-31 MED ORDER — LISINOPRIL 5 MG PO TABS
2.5000 mg | ORAL_TABLET | Freq: Every day | ORAL | Status: DC
  Administered 2018-03-31 – 2018-04-04 (×5): 2.5 mg via ORAL
  Filled 2018-03-31 (×5): qty 1

## 2018-03-31 MED ORDER — MENTHOL (TOPICAL ANALGESIC) 4 % EX GEL: 1.0000 "application " | Freq: Four times a day (QID) | CUTANEOUS | Status: DC | PRN

## 2018-03-31 MED ORDER — COLCHICINE 0.6 MG PO TABS
0.6000 mg | ORAL_TABLET | Freq: Every day | ORAL | Status: DC
  Administered 2018-03-31: 0.6 mg via ORAL
  Filled 2018-03-31 (×2): qty 1

## 2018-03-31 MED ORDER — OXYCODONE-ACETAMINOPHEN 5-325 MG PO TABS
2.0000 | ORAL_TABLET | Freq: Once | ORAL | Status: AC
  Administered 2018-03-31: 2 via ORAL
  Filled 2018-03-31: qty 2

## 2018-03-31 MED ORDER — DICLOFENAC SODIUM 1 % TD GEL
2.0000 g | Freq: Four times a day (QID) | TRANSDERMAL | Status: DC
  Administered 2018-04-01 – 2018-04-04 (×13): 2 g via TOPICAL
  Filled 2018-03-31: qty 100

## 2018-03-31 NOTE — ED Notes (Signed)
XR at bedside

## 2018-03-31 NOTE — Discharge Planning (Signed)
[facility] consulted regarding [facility] services.  EDCM contacted [facility] to access for services.  Awaiting reply before d/c pt.

## 2018-03-31 NOTE — Progress Notes (Signed)
Left lower extremity venous duplex completed. Preliminary results. Technically difficult due to body habitus. Unable to visualize the peroneal veins. There is no evidence of DVT. [place] Levis Nazir,RVS 03/31/2018, 1:01 PM

## 2018-03-31 NOTE — ED Provider Notes (Signed)
[place] DEPT Provider Note   CSN: [id number] Arrival date & time: 03/31/18  1101     History   Chief Complaint Chief Complaint  Patient presents with  . Knee Pain    HPI Ronnie Hernandez is a 57 y.o. male.  He has a history of morbid obesity and has not been able to transfer for a week secondary to left leg pain.  He says is primarily in his knee but also is in the back of his thigh.  Is worse with any kind of movement or rolling.  He said is gotten so bad that he is been stuck in bed.  He is a history of gout and is feet and his hands and not sure if it is that.  Is been no fever no history of trauma.  He feels like he is gained a lot of fluid in the legs and he is not sure if he needs to be diuresed.  He does not feel short of breath no chest pain.  The history is provided by the patient.  Knee Pain   This is a new problem. The current episode started more than 1 week ago. The problem occurs constantly. The problem has not changed since onset.The pain is present in the left knee and left upper leg. The quality of the pain is described as sharp. The pain is severe. Associated symptoms include limited range of motion and stiffness. The symptoms are aggravated by activity and standing. He has tried OTC ointments for the symptoms. The treatment provided mild relief. There has been no history of extremity trauma. Family history is significant for gout.    Past Medical History:  Diagnosis Date  . Arthritis    "knees, hands" (04/19/2017)  . Childhood asthma   . Diabetes type 2, uncontrolled ([place])   . Gout    "have had it in both feet, both hands, right shoulder, back" (04/19/2017)  . Hyperlipidemia   . Hypertension   . Morbidly obese ([place])   . Pulmonary embolism ([place]) 2017?    Patient Active Problem List   Diagnosis Date Noted  . Knee pain   . Acute gout 04/18/2017  . Pulmonary embolus ([place]) 08/05/2016  . Hyperglycemia, drug-induced 07/08/2016   . Impaired glucose tolerance 07/08/2016  . Leukocytosis 07/06/2016  . Unable to walk 07/06/2016  . Acute gouty arthritis 07/06/2016  . Abnormality of gait 02/18/2016  . Cellulitis of left lower extremity 01/27/2016  . Type 2 diabetes mellitus ([place]) 12/25/2013  . Drug-induced gout 12/25/2013  . Hyperlipidemia 08/01/2013  . Morbid obesity ([place])   . Hypertension   . Diabetes type 2, uncontrolled ([place])   . Gout   . Colon cancer screening 07/27/2011  . Benign neoplasm of colon 07/27/2011    Past Surgical History:  Procedure Laterality Date  . COLONOSCOPY W/ BIOPSIES AND POLYPECTOMY  07/2011   Archie Endo 07/27/2011        Home Medications    Prior to Admission medications   Medication Sig Start Date End Date Taking? Authorizing Provider  acetaminophen (TYLENOL) 500 MG tablet Take 1,000-1,500 mg by mouth 2 (two) times daily as needed for mild pain.    [provider]  albuterol (PROVENTIL HFA;VENTOLIN HFA) 108 (90 Base) MCG/ACT inhaler Inhale 1-2 puffs into the lungs every 6 (six) hours as needed for wheezing or shortness of breath.    [provider]  allopurinol (ZYLOPRIM) 300 MG tablet TAKE 1 TABLET(300 MG) BY MOUTH DAILY 03/20/18  [name], NP  atorvastatin (LIPITOR) 10 MG tablet Take 1 tablet (10 mg total) by mouth daily at 6 PM. 11/24/17   [name], NP  colchicine 0.6 MG tablet TAKE 1 TABLET BY MOUTH ONCE DAILY 02/17/18   [name], NP  diclofenac sodium (VOLTAREN) 1 % GEL Apply 2 g topically 4 (four) times daily. Patient taking differently: Apply 2 g topically 4 (four) times daily as needed (arm and shoulder pain).  09/08/17   Duffy Bruce, MD  furosemide (LASIX) 20 MG tablet TAKE 2 TABLETS BY MOUTH EVERY DAY 02/17/18   Reed, Tiffany L, DO  lisinopril (PRINIVIL,ZESTRIL) 2.5 MG tablet TAKE 1 TABLET BY MOUTH ONCE DAILY 02/17/18   [name], NP  nystatin-triamcinolone ointment (MYCOLOG) Apply 1 application topically 2 (two) times  daily. Patient taking differently: Apply 1 application topically 2 (two) times daily as needed (rash or yeast infection.).  11/29/16   [name], NP  predniSONE (DELTASONE) 20 MG tablet Take 2 tablets (40 mg total) by mouth daily. 11/15/17   Kirichenko, Lahoma Rocker, PA-C  trolamine salicylate (ASPERCREME) 10 % cream Apply 1 application topically 2 (two) times daily as needed for muscle pain.    [provider]    Family History Family History  Problem Relation Age of Onset  . Colon cancer Maternal Grandfather 82  . Heart attack Father   . Stomach cancer Maternal Aunt 80    Social History Social History   Tobacco Use  . Smoking status: Former Smoker    Last attempt to quit: 02/09/1982    Years since quitting: 36.1  . Smokeless tobacco: Never Used  Substance Use Topics  . Alcohol use: No  . Drug use: Yes    Comment: 04/19/2017 "nothing since I was 21"     Allergies   Uloric [febuxostat]   Review of Systems Review of Systems  Constitutional: Negative for fever.  HENT: Negative for sore throat.   Eyes: Negative for visual disturbance.  Respiratory: Negative for shortness of breath.   Cardiovascular: Negative for chest pain.  Gastrointestinal: Negative for abdominal pain.  Genitourinary: Negative for dysuria.  Musculoskeletal: Positive for gait problem, joint swelling and stiffness.  Skin: Negative for rash.  Neurological: Negative for headaches.     Physical Exam Updated Vital Signs BP 138/67   Pulse 91   Temp 97.7 F (36.5 C) (Oral)   Resp 15   Ht 5\' 6"  (1.676 m)   Wt (!) 215.5 kg   SpO2 98%   BMI 76.67 kg/m   Physical Exam  Constitutional: He appears well-developed and well-nourished.  HENT:  Head: Normocephalic and atraumatic.  Eyes: Conjunctivae are normal.  Neck: Neck supple.  Cardiovascular: Normal rate, regular rhythm and normal heart sounds.  Pulmonary/Chest: Effort normal. No respiratory distress. He has no wheezes.  Abdominal:  Soft. He exhibits no mass. There is no guarding.  Musculoskeletal:  He has chronic stasis changes of his lower extremities.  He is diffuse tenderness over his left knee and thigh.  There is no obvious erythema.  He has nonpitting edema both lower extremities.  He has pain with any range of motion of his right knee.  Neurological: He is alert. GCS eye subscore is 4. GCS verbal subscore is 5. GCS motor subscore is 6.  Skin: Skin is warm and dry. Capillary refill takes less than 2 seconds.  Psychiatric: He has a normal mood and affect.  Nursing note and vitals reviewed.    ED Treatments /  Results  Labs (all labs ordered are listed, but only abnormal results are displayed) Labs Reviewed  BASIC METABOLIC PANEL - Abnormal; Notable for the following components:      Result Value   Glucose, Bld 106 (*)    All other components within normal limits  CBC WITH DIFFERENTIAL/PLATELET - Abnormal; Notable for the following components:   RBC 4.09 (*)    Hemoglobin 12.4 (*)    All other components within normal limits  COMPREHENSIVE METABOLIC PANEL - Abnormal; Notable for the following components:   Glucose, Bld 180 (*)    BUN 24 (*)    Albumin 2.6 (*)    All other components within normal limits  CBC - Abnormal; Notable for the following components:   RBC 4.19 (*)    Hemoglobin 12.5 (*)    All other components within normal limits  HEMOGLOBIN A1C - Abnormal; Notable for the following components:   Hgb A1c MFr Bld 5.8 (*)    All other components within normal limits  BRAIN NATRIURETIC PEPTIDE  HIV ANTIBODY (ROUTINE TESTING W REFLEX)  I-STAT TROPONIN, ED    EKG EKG Interpretation  Date/Time:  Friday March 31 2018 11:31:54 EDT Ventricular Rate:  92 PR Interval:    QRS Duration: 100 QT Interval:  384 QTC Calculation: 475 R Axis:   -27 Text Interpretation:  Sinus rhythm Borderline left axis deviation similar to prior 2/18 Confirmed by Aletta Edouard [phone]) on 03/31/2018 11:44:26 AM  Also confirmed by Aletta Edouard [phone]), editor Hattie Perch ([id number])  on 03/31/2018 2:16:51 PM   Radiology Dg Knee Complete 4 Views Left  Result Date: 03/31/2018 CLINICAL DATA:  Left knee pain with inability to move left leg x 1 week Gout? No known injury *Best obtainable images due to pt condition/ habitus EXAM: LEFT KNEE - COMPLETE 4+ VIEW COMPARISON:  None. FINDINGS: Study quality is degraded by patient body habitus.There is narrowing of the MEDIAL and patellofemoral compartments, associated with osteophytes and sclerosis. No acute fracture or subluxation. Suspect small joint effusion. IMPRESSION: 1. Degenerative changes. 2. Joint effusion. Electronically Signed   By: Nolon Nations M.D.   On: 03/31/2018 12:16    Procedures Procedures (including critical care time)  Medications Ordered in ED Medications - No data to display   Initial Impression / Assessment and Plan / ED Course  I have reviewed the triage vital signs and the nursing notes.  Pertinent labs & imaging results that were available during my care of the patient were reviewed by me and considered in my medical decision making (see chart for details).  Clinical Course as of Apr 02 1055  Fri Mar 31, 2018  1307 Patient's knee x-ray showed possible small effusion and degenerative changes.  His duplex showed no acute DVT but was technically limited to body habitus.  His lab work back so far is been fairly unremarkable but the BNP is still pending.   [MB]  1400 Discussed with Rosendo Gros from social work who recommends placing a face-to-face order and will have him evaluated.   [MB]  3295 Patient's knee pain work-up is so far been very unremarkable.  No evidence of DVT no fracture.  He does have a history of gout and so this may be a cause but there is no evidence that this is a septic joint as he is afebrile and normal white count.  No overlying erythema.  We will try a dose of oral prednisone.   [MB]  1884 I do not see a  medical  indication for admitting the patient to the hospital other than the safety aspect of he is unable to safely transfer secondary to knee pain.  I have placed consults into case management social work physical therapy to see what her options are regarding getting him home versus being placed.   [MB]  1612 Patient seen by social work and case management. Unable to place. Patient not safe for discharge as can not stand or walk. Took 4 people to lift out of house and they had to drag . Paging Hospitalist for admission.    [MB]    Clinical Course User Index [MB] Hayden Rasmussen, MD     Final Clinical Impressions(s) / ED Diagnoses   Final diagnoses:  Acute pain of left knee    ED Discharge Orders    None       Hayden Rasmussen, MD 04/01/18 1056

## 2018-03-31 NOTE — ED Notes (Signed)
Bed: WA02 Expected date:  Expected time:  Means of arrival:  Comments: 57 yo knee pain

## 2018-03-31 NOTE — Discharge Instructions (Addendum)
You were evaluated in the emergency department for left knee pain that caused you to have difficulty and ambulating and transfers.  You had x-rays of the knee and a vascular ultrasound that did not show an obvious cause of your symptoms.  This is possibly an arthritis flare or even gout.  We are treating you with some steroids and pain medicine.

## 2018-03-31 NOTE — Progress Notes (Signed)
Received report on the patient. Pending transport.

## 2018-03-31 NOTE — Discharge Planning (Signed)
Erendida Wrenn J. Yolonda Purtle, RN, BSN, NCM [phone] EDSW Spoke with pt at bedside regarding discharge planning for Home Health Services. Offered pt list of home health agencies to choose from.  Pt chose [facility] to render services. Karen of [facility] notified. Patient made aware that [facility] will be in contact in 24-48 hours.  No DME needs identified at this time.  

## 2018-03-31 NOTE — ED Notes (Signed)
Vas US at bedside 

## 2018-03-31 NOTE — Progress Notes (Signed)
CSW asked to speak to patient by RN CM regarding Home Health services. CSW spoke with patient at bedside who reported that he lives with his niece. Per patient, he has been having difficulties with his knee and cannot move it. Patient stated he has not moved from his bed for the last week. Per patient, his niece tires from taking care of him and has two kids of her own. Patient reported he would like to go to a rehab facility. CSW addressed barriers to placement such as patient having [facility] and it likely taking at least 24-48 hour to get authorization. CSW explained that insurance companies do not work over the weekend so it is not likely placement would be found until next week. CSW spoke with patient about Home Health. Per patient, he has had it before but is not sure what the name of the company is. CSW to leave handoff for 2nd shift CSW.  Ollen Barges, [place] Work Department  [employer]  [phone]

## 2018-03-31 NOTE — ED Notes (Signed)
ED TO INPATIENT HANDOFF REPORT  Name/Age/Gender Ronnie Hernandez 57 y.o. male  Code Status Code Status History    Date Active Date Inactive Code Status Order ID Comments User Context   04/18/2017 1720 04/28/2017 1708 Full Code [id number]  Rosita Fire, MD ED   08/05/2016 2019 08/08/2016 1915 Full Code [id number]  Norval Morton, MD ED   07/06/2016 1808 07/09/2016 2300 Full Code [id number]  Barton Dubois, MD Inpatient   01/27/2016 0245 02/04/2016 1934 Full Code [id number]  Rise Patience, MD Inpatient      Home/SNF/Other Rehab  Chief Complaint lt knee pain  Level of Care/Admitting Diagnosis ED Disposition    ED Disposition Condition [facility] Area: [facility] [[id number]]  Level of Care: Med-Surg [16]  Diagnosis: Left knee pain [355732]  Admitting Physician: Elodia Florence [phone]  Attending Physician: Cephus Slater, A CAL[place]WE[place] [phone]  PT Class (Do Not Modify): Observation [104]  PT Acc Code (Do Not Modify): Observation [10022]       Medical History Past Medical History:  Diagnosis Date  . Arthritis    "knees, hands" (04/19/2017)  . Childhood asthma   . Diabetes type 2, uncontrolled ([place])   . Gout    "have had it in both feet, both hands, right shoulder, back" (04/19/2017)  . Hyperlipidemia   . Hypertension   . Morbidly obese ([place])   . Pulmonary embolism ([place]) 2017?    Allergies Allergies  Allergen Reactions  . Uloric [Febuxostat] Other (See Comments)    Made the skin on feet and legs "hurt"    IV Location/Drains/Wounds Patient Lines/Drains/Airways Status   Active Line/Drains/Airways    Name:   Placement date:   Placement time:   Site:   Days:   Wound / Incision (Open or Dehisced) 01/30/16 Left left leg wound    01/30/16    -    -   791          Labs/Imaging Results for orders placed or performed during the hospital encounter of 03/31/18 (from the past 48 hour(s))  I-stat troponin, ED     Status: None    Collection Time: 03/31/18 12:31 PM  Result Value Ref Range   Troponin i, poc 0.00 0.00 - 0.08 ng/mL   Comment 3            Comment: Due to the release kinetics of cTnI, a negative result within the first hours of the onset of symptoms does not rule out myocardial infarction with certainty. If myocardial infarction is still suspected, repeat the test at appropriate intervals.   Basic metabolic panel     Status: Abnormal   Collection Time: 03/31/18 12:32 PM  Result Value Ref Range   Sodium 138 135 - 145 mmol/L   Potassium 4.0 3.5 - 5.1 mmol/L   [place] 100 98 - 111 mmol/L   CO2 26 22 - 32 mmol/L   Glucose, Bld 106 (H) 70 - 99 mg/dL   BUN 20 6 - 20 mg/dL   Creatinine, Ser 0.83 0.61 - 1.24 mg/dL   Calcium 8.9 8.9 - 10.3 mg/dL   GFR calc non Af Amer >60 >60 mL/min   GFR calc Af Amer >60 >60 mL/min    Comment: (NOTE) The eGFR has been calculated using the CKD EPI equation. This calculation has not been validated in all clinical situations. eGFR's persistently <60 mL/min signify possible Chronic Kidney Disease.    Anion gap 12 5 - 15  Comment: Performed at [facility], [place] [address], [place], [place] [address]  CBC with Differential     Status: Abnormal   Collection Time: 03/31/18 12:32 PM  Result Value Ref Range   WBC 8.9 4.0 - 10.5 K/uL   RBC 4.09 (L) 4.22 - 5.81 MIL/uL   Hemoglobin 12.4 (L) 13.0 - 17.0 g/dL   HCT 39.4 39.0 - 52.0 %   MCV 96.3 80.0 - 100.0 fL   MCH 30.3 26.0 - 34.0 pg   MCHC 31.5 30.0 - 36.0 g/dL   RDW 13.5 11.5 - 15.5 %   Platelets 279 150 - 400 K/uL   nRBC 0.0 0.0 - 0.2 %   Neutrophils Relative % 70 %   Neutro Abs 6.3 1.7 - 7.7 K/uL   Lymphocytes Relative 18 %   Lymphs Abs 1.6 0.7 - 4.0 K/uL   Monocytes Relative 9 %   Monocytes Absolute 0.8 0.1 - 1.0 K/uL   Eosinophils Relative 3 %   Eosinophils Absolute 0.2 0.0 - 0.5 K/uL   Basophils Relative 0 %   Basophils Absolute 0.0 0.0 - 0.1 K/uL   Immature Granulocytes 0 %   Abs  Immature Granulocytes 0.03 0.00 - 0.07 K/uL    Comment: Performed at [facility], [place] [address], [place], [place] [address]   Dg Knee Complete 4 Views Left  Result Date: 03/31/2018 CLINICAL DATA:  Left knee pain with inability to move left leg x 1 week Gout? No known injury *Best obtainable images due to pt condition/ habitus EXAM: LEFT KNEE - COMPLETE 4+ VIEW COMPARISON:  None. FINDINGS: Study quality is degraded by patient body habitus.There is narrowing of the MEDIAL and patellofemoral compartments, associated with osteophytes and sclerosis. No acute fracture or subluxation. Suspect small joint effusion. IMPRESSION: 1. Degenerative changes. 2. Joint effusion. Electronically Signed   By: Nolon Nations M.D.   On: 03/31/2018 12:16    Pending Labs Unresulted Labs (From admission, onward)    Start     Ordered   03/31/18 1126  Brain natriuretic peptide  Once,   STAT     03/31/18 1126   Signed and Held  HIV antibody (Routine Testing)  Once,   R     Signed and Held   Signed and Held  CBC  (enoxaparin (LOVENOX)    CrCl >/= 30 ml/min)  Once,   R    Comments:  Baseline for enoxaparin therapy IF NOT ALREADY DRAWN.  Notify MD if PLT < 100 K.    Signed and Held   Signed and Held  Creatinine, serum  (enoxaparin (LOVENOX)    CrCl >/= 30 ml/min)  Once,   R    Comments:  Baseline for enoxaparin therapy IF NOT ALREADY DRAWN.    Signed and Held   Signed and Held  Creatinine, serum  (enoxaparin (LOVENOX)    CrCl >/= 30 ml/min)  Weekly,   R    Comments:  while on enoxaparin therapy    Signed and Held   Signed and Held  Comprehensive metabolic panel  Tomorrow morning,   R     Signed and Held   Signed and Held  CBC  Tomorrow morning,   R     Signed and Held          Vitals/Pain Today's Vitals   03/31/18 1700 03/31/18 1715 03/31/18 1730 03/31/18 1745  BP: (!) 154/73  (!) 115/57   Pulse: 90 80 83 91  Resp: 18 17  16 19  Temp:      TempSrc:      SpO2: 100% 95% 94% 95%   Weight:      Height:      PainSc:        Isolation Precautions No active isolations  Medications Medications  Influenza vac split quadrivalent PF (FLUARIX) injection 0.5 mL (has no administration in time range)  predniSONE (DELTASONE) tablet 60 mg (60 mg Oral Given 03/31/18 1508)  oxyCODONE-acetaminophen (PERCOCET/ROXICET) 5-325 MG per tablet 2 tablet (2 tablets Oral Given 03/31/18 1507)    Mobility non-ambulatory

## 2018-03-31 NOTE — Progress Notes (Addendum)
2nd shift ED CSW received a handoff from the 1st shift [facility] ED CSW.   CSW staffed with leadership and pt has been given a steroid shot as tx for the pt's knee which the pt states is has not been useable.  Per. 1st shift ED CSW pt is requesting rehab placement.  1st shift ED CSW stated to pt [facility] authorization is not possible over the weekend due to the [place] office being closed and pt voiced understanding, per the 1st a shift ED CSW.  Per the 1st shift ED CSW pt lives with his niece and his niece's children. Per the 1st shift ED CSW EPD is still assessing.  CSW will continue to follow for D/C needs.  Ronnie Guild. Mayumi Summerson, LCSW, LCAS, CSI Clinical Social Worker Ph: [phone]

## 2018-03-31 NOTE — H&P (Signed)
History and Physical    Ronnie Hernandez OVZ:[id number] DOB: 1961-06-08 DOA: 03/31/2018  PCP: [name], NP  Patient coming from: home  I have personally briefly reviewed patient's old medical records in [place]  Chief Complaint: L knee pain  HPI: Ronnie Hernandez is a 57 y.o. male with medical history significant of history of pulmonary embolism, HTN, gout, asthma, obesity, T2DM and multiple other medical problems presenting with L knee pain.  He notes his symptoms started 1 week ago.Marland Kitchen  He's been in bed for 1 week.  He notes he hasn't been able to stand for a week.  His L knee feels swollen and he feels like he can't put weight on it.  He describes the pain as sharp, constant, and going down the leg.  He denies fevers, chills, chest pain, shortness of breath, numbness, tingling, weakness.  ED Course: Labs, imaging, steroids.  Admit to hospitalist given inability to ambulate.  Review of Systems: As per HPI otherwise 10 point review of systems negative.   Past Medical History:  Diagnosis Date  . Arthritis    "knees, hands" (04/19/2017)  . Childhood asthma   . Diabetes type 2, uncontrolled ([place])   . Gout    "have had it in both feet, both hands, right shoulder, back" (04/19/2017)  . Hyperlipidemia   . Hypertension   . Morbidly obese ([place])   . Pulmonary embolism ([place]) 2017?    Past Surgical History:  Procedure Laterality Date  . COLONOSCOPY W/ BIOPSIES AND POLYPECTOMY  07/2011   Archie Endo 07/27/2011     reports that he quit smoking about 36 years ago. He has never used smokeless tobacco. He reports that he has current or past drug history. He reports that he does not drink alcohol.  Allergies  Allergen Reactions  . Uloric [Febuxostat] Other (See Comments)    Made the skin on feet and legs "hurt"    Family History  Problem Relation Age of Onset  . Colon cancer Maternal Grandfather 71  . Heart attack Father   . Stomach cancer Maternal Aunt 80   Prior  to Admission medications   Medication Sig Start Date End Date Taking? Authorizing Provider  acetaminophen (TYLENOL) 650 MG CR tablet Take 650 mg by mouth every 8 (eight) hours as needed for pain.   Yes [provider]  allopurinol (ZYLOPRIM) 300 MG tablet TAKE 1 TABLET(300 MG) BY MOUTH DAILY Patient taking differently: Take 300 mg by mouth daily.  03/20/18  Yes [name], NP  atorvastatin (LIPITOR) 10 MG tablet Take 1 tablet (10 mg total) by mouth daily at 6 PM. 11/24/17  Yes Eubanks, Carlos American, NP  colchicine 0.6 MG tablet TAKE 1 TABLET BY MOUTH ONCE DAILY Patient taking differently: Take 0.6 mg by mouth daily.  02/17/18  Yes [name], NP  furosemide (LASIX) 20 MG tablet TAKE 2 TABLETS BY MOUTH EVERY DAY Patient taking differently: Take 40 mg by mouth daily.  02/17/18  Yes Reed, Tiffany L, DO  lisinopril (PRINIVIL,ZESTRIL) 2.5 MG tablet TAKE 1 TABLET BY MOUTH ONCE DAILY Patient taking differently: Take 2.5 mg by mouth daily.  02/17/18  Yes [name], NP  Menthol, Topical Analgesic, (BIOFREEZE) 4 % GEL Apply 1 application topically 4 (four) times daily as needed (pain).   Yes [provider]  oxyCODONE-acetaminophen (PERCOCET/ROXICET) 5-325 MG tablet Take 1 tablet by mouth every 4 (four) hours as needed for severe pain.   Yes [provider]  trolamine salicylate (ASPERCREME) 10 % cream Apply 1 application topically 2 (two) times daily as needed for muscle pain.   Yes [provider]  albuterol (PROVENTIL HFA;VENTOLIN HFA) 108 (90 Base) MCG/ACT inhaler Inhale 1-2 puffs into the lungs every 6 (six) hours as needed for wheezing or shortness of breath.    [provider]  diclofenac sodium (VOLTAREN) 1 % GEL Apply 2 g topically 4 (four) times daily. Patient taking differently: Apply 2 g topically 4 (four) times daily as needed (arm and shoulder pain).  09/08/17   Duffy Bruce, MD  nystatin-triamcinolone ointment [facility]) Apply 1  application topically 2 (two) times daily. Patient not taking: Reported on 03/31/2018 11/29/16   [name], NP  predniSONE (DELTASONE) 20 MG tablet Take 3 tablets (60 mg total) by mouth daily. 03/31/18   Hayden Rasmussen, MD    Physical Exam: Vitals:   03/31/18 1715 03/31/18 1730 03/31/18 1745 03/31/18 1825  BP:  (!) 115/57  139/68  Pulse: 80 83 91 84  Resp: 17 16 19 15   Temp:    97.7 F (36.5 C)  TempSrc:    Oral  SpO2: 95% 94% 95% 96%  Weight:      Height:        Constitutional: NAD, calm, comfortable, morbidly obese Vitals:   03/31/18 1715 03/31/18 1730 03/31/18 1745 03/31/18 1825  BP:  (!) 115/57  139/68  Pulse: 80 83 91 84  Resp: 17 16 19 15   Temp:    97.7 F (36.5 C)  TempSrc:    Oral  SpO2: 95% 94% 95% 96%  Weight:      Height:       Eyes: PERRL, lids and conjunctivae normal ENMT: Mucous membranes are moist. Posterior pharynx clear of any exudate or lesions.Normal dentition.  Neck: normal, supple, no masses, no thyromegaly Respiratory: clear to auscultation bilaterally, no wheezing, no crackles. Normal respiratory effort. No accessory muscle use.  Cardiovascular: Regular rate and rhythm, no murmurs / rubs / gallops. No extremity edema. No carotid bruits.  Abdomen: no tenderness, no masses palpated, protuberant. No hepatosplenomegaly. Bowel sounds positive.  Musculoskeletal: no clubbing / cyanosis. TTP over L knee with limited range of motion. Skin: no rashes, lesions, ulcers. No induration Neurologic: CN 2-12 grossly intact. Sensation intact. Moving all extremities. Psychiatric: Normal judgment and insight. Alert and oriented x 3. Normal mood.    Labs on Admission: I have personally reviewed following labs and imaging studies  CBC: Recent Labs  Lab 03/31/18 1232  WBC 8.9  NEUTROABS 6.3  HGB 12.4*  HCT 39.4  MCV 96.3  PLT 992   Basic Metabolic Panel: Recent Labs  Lab 03/31/18 1232  NA 138  K 4.0  CL 100  CO2 26  GLUCOSE 106*  BUN 20    CREATININE 0.83  CALCIUM 8.9   GFR: Estimated Creatinine Clearance: 172.9 mL/min (by C-G formula based on SCr of 0.83 mg/dL). Liver Function Tests: No results for input(s): AST, ALT, ALKPHOS, BILITOT, PROT, ALBUMIN in the last 168 hours. No results for input(s): LIPASE, AMYLASE in the last 168 hours. No results for input(s): AMMONIA in the last 168 hours. Coagulation Profile: No results for input(s): INR, PROTIME in the last 168 hours. Cardiac Enzymes: No results for input(s): CKTOTAL, CKMB, CKMBINDEX, TROPONINI in the last 168 hours. BNP (last 3 results) No results for input(s): PROBNP in the last 8760 hours. HbA1C: No results for input(s): HGBA1C in the last 72 hours. CBG: No results for input(s): GLUCAP  in the last 168 hours. Lipid Profile: No results for input(s): CHOL, HDL, LDLCALC, TRIG, CHOLHDL, LDLDIRECT in the last 72 hours. Thyroid Function Tests: No results for input(s): TSH, T4TOTAL, FREET4, T3FREE, THYROIDAB in the last 72 hours. Anemia Panel: No results for input(s): VITAMINB12, FOLATE, FERRITIN, TIBC, IRON, RETICCTPCT in the last 72 hours. Urine analysis:    Component Value Date/Time   COLORURINE YELLOW 11/15/2017 [place] 11/15/2017 1141   APPEARANCEUR Clear 12/05/2015 1226   LABSPEC 1.021 11/15/2017 1141   PHURINE 5.0 11/15/2017 1141   GLUCOSEU NEGATIVE 11/15/2017 1141   HGBUR SMALL (A) 11/15/2017 1141   BILIRUBINUR NEGATIVE 11/15/2017 1141   BILIRUBINUR Negative 12/05/2015 1226   KETONESUR NEGATIVE 11/15/2017 1141   PROTEINUR NEGATIVE 11/15/2017 1141   UROBILINOGEN 0.2 12/28/2013 1117   NITRITE POSITIVE (A) 11/15/2017 1141   LEUKOCYTESUR NEGATIVE 11/15/2017 1141   LEUKOCYTESUR Negative 12/05/2015 1226    Radiological Exams on Admission: Dg Knee Complete 4 Views Left  Result Date: 03/31/2018 CLINICAL DATA:  Left knee pain with inability to move left leg x 1 week Gout? No known injury *Best obtainable images due to pt condition/  habitus EXAM: LEFT KNEE - COMPLETE 4+ VIEW COMPARISON:  None. FINDINGS: Study quality is degraded by patient body habitus.There is narrowing of the MEDIAL and patellofemoral compartments, associated with osteophytes and sclerosis. No acute fracture or subluxation. Suspect small joint effusion. IMPRESSION: 1. Degenerative changes. 2. Joint effusion. Electronically Signed   By: Nolon Nations M.D.   On: 03/31/2018 12:16    EKG: Independently reviewed. NSR, appears similar to priors  Assessment/Plan Active Problems:   Left knee pain   Left Knee Pain: Pain over past week limiting ability to ambulate.  Plain film notable for degenerative changes as well as suspected small joint effusion.  Of note, he had similar admission in 03/2017 where he was admitted for gout flare.  He notes that this is similar to gout flares in the past and he states he's started to note some improvement after the first dose of steroids he was given here.  For now, given lack of infectious sx and pt describing this as similar to prior gout flares, will treat as such with steroids and follow sx. Continue prednisone (consider IV steroids if not improving) Continue allopurinol and colchicine If pain worsens or does not continue to improve or pt develops si/sx infection, would recommend discussing tapping joint with IR or ortho. PT/OT LE [place] prelim without evidence of DVT, follow final read  Asthma: continue albuterol  HLD: continue lipitor  HTN: continue lisinopril and lasix  HFpEF: continue lasix  T2DM: not on meds, follow A1c, add SSI as needed  Hx PE: no longer on anticoagulation  DVT prophylaxis: lovenox  Code Status: full  Family Communication: none at bedside  Disposition Plan: pending improvement  Consults called: none  Admission status: observation    Fayrene Helper MD [facility] Pager [phone]  If 7PM-7AM, please contact night-coverage [email or web address] Password [email or web address]  03/31/2018, 7:30 PM

## 2018-04-01 DIAGNOSIS — M25562 Pain in left knee: Secondary | ICD-10-CM | POA: Diagnosis not present

## 2018-04-01 LAB — CBC
HEMATOCRIT: 39.9 % (ref 39.0–52.0)
HEMOGLOBIN: 12.5 g/dL — AB (ref 13.0–17.0)
MCH: 29.8 pg (ref 26.0–34.0)
MCHC: 31.3 g/dL (ref 30.0–36.0)
MCV: 95.2 fL (ref 80.0–100.0)
NRBC: 0 % (ref 0.0–0.2)
Platelets: 295 10*3/uL (ref 150–400)
RBC: 4.19 MIL/uL — AB (ref 4.22–5.81)
RDW: 13.2 % (ref 11.5–15.5)
WBC: 10.2 10*3/uL (ref 4.0–10.5)

## 2018-04-01 LAB — COMPREHENSIVE METABOLIC PANEL
ALT: 31 U/L (ref 0–44)
ANION GAP: 12 (ref 5–15)
AST: 32 U/L (ref 15–41)
Albumin: 2.6 g/dL — ABNORMAL LOW (ref 3.5–5.0)
Alkaline Phosphatase: 102 U/L (ref 38–126)
BILIRUBIN TOTAL: 0.6 mg/dL (ref 0.3–1.2)
BUN: 24 mg/dL — ABNORMAL HIGH (ref 6–20)
CHLORIDE: 99 mmol/L (ref 98–111)
CO2: 27 mmol/L (ref 22–32)
Calcium: 8.9 mg/dL (ref 8.9–10.3)
Creatinine, Ser: 0.98 mg/dL (ref 0.61–1.24)
Glucose, Bld: 180 mg/dL — ABNORMAL HIGH (ref 70–99)
POTASSIUM: 3.8 mmol/L (ref 3.5–5.1)
Sodium: 138 mmol/L (ref 135–145)
TOTAL PROTEIN: 7 g/dL (ref 6.5–8.1)

## 2018-04-01 LAB — GLUCOSE, CAPILLARY: Glucose-Capillary: 184 mg/dL — ABNORMAL HIGH (ref 70–99)

## 2018-04-01 LAB — HEMOGLOBIN A1C
HEMOGLOBIN A1C: 5.8 % — AB (ref 4.8–5.6)
MEAN PLASMA GLUCOSE: 119.76 mg/dL

## 2018-04-01 LAB — HIV ANTIBODY (ROUTINE TESTING W REFLEX): HIV SCREEN 4TH GENERATION: NONREACTIVE

## 2018-04-01 MED ORDER — METHYLPREDNISOLONE SODIUM SUCC 125 MG IJ SOLR
60.0000 mg | Freq: Every day | INTRAMUSCULAR | Status: DC
  Administered 2018-04-01 – 2018-04-02 (×2): 60 mg via INTRAVENOUS
  Filled 2018-04-01 (×2): qty 2

## 2018-04-01 MED ORDER — ALLOPURINOL 300 MG PO TABS
300.0000 mg | ORAL_TABLET | Freq: Two times a day (BID) | ORAL | Status: DC
  Administered 2018-04-01 – 2018-04-04 (×6): 300 mg via ORAL
  Filled 2018-04-01 (×6): qty 1

## 2018-04-01 MED ORDER — COLCHICINE 0.6 MG PO TABS
0.6000 mg | ORAL_TABLET | Freq: Two times a day (BID) | ORAL | Status: DC
  Administered 2018-04-01 – 2018-04-04 (×7): 0.6 mg via ORAL
  Filled 2018-04-01 (×8): qty 1

## 2018-04-01 NOTE — Progress Notes (Signed)
PROGRESS NOTE    Ronnie Hernandez  QVZ:[id number] DOB: Mar 30, 1961 DOA: 03/31/2018 PCP: [name], NP   Brief Narrative: Patient is a 57 year old male with past medical history significant for pulmonary embolism not on anticoagulant, hypertension, gout, asthma, obesity who presented to the emergency department with complaints of worsening left knee pain.  The pain started about a week ago.  He has been not able to ambulate since a week.  Patient admitted for the management of gout flare.  Assessment & Plan:   Active Problems:   Left knee pain  Left knee pain:Has H/O gout.  Low suspicion for septic arthritis. left knee mildly swollen and tender.  Has not been able to bear on the left lower extremity since a week due to pain.  X-rays of the knee showed degenerative changes with suspected small joint effusion.  Similar admission in October 2018.  Requested for PT/OT evaluation.  Gout:Patient is on allopurinol at home.  Continue colchicine.  Started on IV steroids today.  Continue allopurinol with increased dose of 300 mg twice a day.  Patient already feels better with prednisone. Patient has tophies.  He has degenerative changes/deformities on the fingers.  He needs to follow-up with a rheumatologist as an outpatient and will provide resources for that.  Asthma: Currently stable.  Continue his home inhaler  Hyperlipidemia: Continue Lipitor  Hypertension: Currently blood pressure stable.  Continue lisinopril and Lasix  History of heart failure with preserved ejection fraction: Continue Lasix  Type 2 diabetes mellitus: Continue sliding-scale insulin.  Not on medication at home.  Hemoglobin A1c of 5.8.  History of PE: Not on anticoagulation  Morbid obesity: Counseled on the importance of diet and exercise  DVT prophylaxis: Lovenox Code Status: Full Family Communication: None at the bedside Disposition Plan: Pending PT evaluation.  Home/rehab after improvement in the left knee  pain   Consultants: None  Procedures: None  Antimicrobials: None  Subjective:  Patient seen and examined the bedside this morning.  His knee pain has been better this morning.  Is still unable to lift his left lower extremity due to pain. Objective: Vitals:   03/31/18 1745 03/31/18 1825 03/31/18 1955 04/01/18 0513  BP:  139/68 127/75 (!) 112/50  Pulse: 91 84 93 93  Resp: 19 15 16 16   Temp:  97.7 F (36.5 C) 97.7 F (36.5 C) 98 F (36.7 C)  TempSrc:  Oral Oral Oral  SpO2: 95% 96% 96% 97%  Weight:    (!) 240.9 kg  Height:        Intake/Output Summary (Last 24 hours) at 04/01/2018 1227 Last data filed at 04/01/2018 0800 Gross per 24 hour  Intake 300 ml  Output 2550 ml  Net -2250 ml   Filed Weights   03/31/18 1130 04/01/18 0513  Weight: (!) 215.5 kg (!) 240.9 kg    Examination:  General exam: Appears calm and comfortable ,Not in distress, morbidly obese HEENT:PERRL,Oral mucosa moist, Ear/Nose normal on gross exam Respiratory system: Bilateral equal air entry, normal vesicular breath sounds, no wheezes or crackles  Cardiovascular system: S1 & S2 heard, RRR. No JVD, murmurs, rubs, gallops or clicks. No pedal edema. Gastrointestinal system: Abdomen is nondistended, soft and nontender. No organomegaly or masses felt. Normal bowel sounds heard. Central nervous system: Alert and oriented. No focal neurological deficits. Extremities: No edema, no clubbing ,no cyanosis, distal peripheral pulses palpable. Left knee mildly swollen and tender, no erythema Deformities on the fingers of upper extremities, scattered tophies Skin: No rashes,  lesions or ulcers,no icterus ,no pallor MSK: Normal muscle bulk,tone ,power Psychiatry: Judgement and insight appear normal. Mood & affect appropriate.     Data Reviewed: I have personally reviewed following labs and imaging studies  CBC: Recent Labs  Lab 03/31/18 1232 04/01/18 0626  WBC 8.9 10.2  NEUTROABS 6.3  --   HGB 12.4* 12.5*   HCT 39.4 39.9  MCV 96.3 95.2  PLT 279 656   Basic Metabolic Panel: Recent Labs  Lab 03/31/18 1232 04/01/18 0626  NA 138 138  K 4.0 3.8  CL 100 99  CO2 26 27  GLUCOSE 106* 180*  BUN 20 24*  CREATININE 0.83 0.98  CALCIUM 8.9 8.9   GFR: Estimated Creatinine Clearance: 158.3 mL/min (by C-G formula based on SCr of 0.98 mg/dL). Liver Function Tests: Recent Labs  Lab 04/01/18 0626  AST 32  ALT 31  ALKPHOS 102  BILITOT 0.6  PROT 7.0  ALBUMIN 2.6*   No results for input(s): LIPASE, AMYLASE in the last 168 hours. No results for input(s): AMMONIA in the last 168 hours. Coagulation Profile: No results for input(s): INR, PROTIME in the last 168 hours. Cardiac Enzymes: No results for input(s): CKTOTAL, CKMB, CKMBINDEX, TROPONINI in the last 168 hours. BNP (last 3 results) No results for input(s): PROBNP in the last 8760 hours. HbA1C: Recent Labs    03/31/18 2020  HGBA1C 5.8*   CBG: No results for input(s): GLUCAP in the last 168 hours. Lipid Profile: No results for input(s): CHOL, HDL, LDLCALC, TRIG, CHOLHDL, LDLDIRECT in the last 72 hours. Thyroid Function Tests: No results for input(s): TSH, T4TOTAL, FREET4, T3FREE, THYROIDAB in the last 72 hours. Anemia Panel: No results for input(s): VITAMINB12, FOLATE, FERRITIN, TIBC, IRON, RETICCTPCT in the last 72 hours. Sepsis Labs: No results for input(s): PROCALCITON, LATICACIDVEN in the last 168 hours.  No results found for this or any previous visit (from the past 240 hour(s)).       Radiology Studies: Dg Knee Complete 4 Views Left  Result Date: 03/31/2018 CLINICAL DATA:  Left knee pain with inability to move left leg x 1 week Gout? No known injury *Best obtainable images due to pt condition/ habitus EXAM: LEFT KNEE - COMPLETE 4+ VIEW COMPARISON:  None. FINDINGS: Study quality is degraded by patient body habitus.There is narrowing of the MEDIAL and patellofemoral compartments, associated with osteophytes and  sclerosis. No acute fracture or subluxation. Suspect small joint effusion. IMPRESSION: 1. Degenerative changes. 2. Joint effusion. Electronically Signed   By: Nolon Nations M.D.   On: 03/31/2018 12:16        Scheduled Meds: . allopurinol  300 mg Oral BID  . atorvastatin  10 mg Oral q1800  . colchicine  0.6 mg Oral BID  . diclofenac sodium  2 g Topical QID  . enoxaparin (LOVENOX) injection  80 mg Subcutaneous Q24H  . furosemide  40 mg Oral Daily  . lisinopril  2.5 mg Oral Daily  . methylPREDNISolone (SOLU-MEDROL) injection  60 mg Intravenous Daily  . polyethylene glycol  17 g Oral Daily  . senna  1 tablet Oral BID   Continuous Infusions:   LOS: 0 days    Time spent: 25 mins.More than 50% of that time was spent in counseling and/or coordination of care.      Shelly Coss, MD [facility] Pager [phone]  If 7PM-7AM, please contact night-coverage [email or web address] Password [facility] 04/01/2018, 12:27 PM

## 2018-04-01 NOTE — Evaluation (Signed)
Physical Therapy Evaluation Patient Details Name: Ronnie Hernandez MRN: [id number] DOB: 1960-10-05 Today's Date: 04/01/2018   History of Present Illness  Ronnie Hernandez is a 57 y.o. male with medical history significant of history of pulmonary embolism, HTN, gout, asthma, obesity, T2DM and multiple other medical problems presenting with L knee pain.  Clinical Impression  Pt with painin L knee and decreased ability with ROM and strenght on LLE. Pt reports he has ot stood in about a week, and declined in his ambulation for past 2 weeks. He was independent prior to that just using a cane outside the home. Pt is really motivated to get back on his feet. Today we had to do a bed level eval due to lasix and other medical things going on per patient. He worked hard with a bed level exercise program and agrees to [employer].  Will continue to follow .     Follow Up Recommendations SNF    Equipment Recommendations  Other (comment)(depending on progress may need  bariatric RW. )    Recommendations for Other Services       Precautions / Restrictions Restrictions Weight Bearing Restrictions: No      Mobility  Bed Mobility Overal bed mobility: Needs Assistance Bed Mobility: Rolling Rolling: Mod assist         General bed mobility comments: worked on moving in bed, pt states it is improving, howevver he is on an air mattress which makes it a bit difficult. Uses hand raisl to rock and roll a little and can utilize R LE , but difficulty with LLE .   Transfers Overall transfer level: (declined transfer or stadning today due to apinin LLE and lasix just given. )                  Ambulation/Gait                Stairs            Wheelchair Mobility    Modified Rankin (Stroke Patients Only)       Balance                                             Pertinent Vitals/Pain Pain Assessment: 0-10 Pain Score: 3  Pain Location: L  knee  Pain Descriptors / Indicators: Aching;Sharp;Shooting Pain Intervention(s): Limited activity within patient's tolerance;Monitored during session    [place] expects to be discharged to:: Private residence Living Arrangements: Other relatives(neice) Available Help at Discharge: Family;Friend(s)(pt has an aide that comes to helpw ith bathing 2x/week ) Type of Home: [place] Access: Level entry     Home Layout: [place]: [place] - 4 wheels;Bedside commode;Tub bench;Cane - quad;Grab bars - tub/shower;Grab bars - toilet;Hand held shower head;Adaptive equipment      Prior Function           Comments: Uses quad cane for mobility, uses power grocery store carts, however has not been about to walk much for past 2 weeks, and esepcially the last week in bed due to L knee pain with gout     Hand Dominance   Dominant Hand: Right    Extremity/Trunk Assessment        Lower Extremity Assessment Lower Extremity Assessment: Generalized weakness;LLE deficits/detail LLE Deficits / Details: L LE in ER rotation, diffcult to flex the knee  and IR to neutral . strength on LLE grossly 3-/5, RLE 4/5        Communication   Communication: No difficulties  Cognition Arousal/Alertness: Awake/alert Behavior During Therapy: WFL for tasks assessed/performed Overall Cognitive Status: Within Functional Limits for tasks assessed                                        General Comments      Exercises Other Exercises Other Exercises: performed AAROM exercises with B LE 10x: ankle pumps, heels lsides, IR/ER rotation at the hip, modifies bridging on each side, and SLR  Other Exercises: also attached blue and orange theraband on bed for: for tricep extension, bicep curls, body horizontal shoulder adduction, and other armmovment. Pt demonstrated understadning adn use of the therband. excited about exercises.    Assessment/Plan    PT Assessment  Patient needs continued PT services  PT Problem List Decreased strength;Decreased range of motion;Decreased activity tolerance;Decreased mobility       PT Treatment Interventions Gait training;DME instruction;Functional mobility training;Therapeutic activities;Therapeutic exercise;Patient/family education    PT Goals (Current goals can be found in the Care Plan section)  Acute Rehab PT Goals Patient Stated Goal: I want to get back on my feet again.  PT Goal Formulation: With patient Time For Goal Achievement: 04/15/18 Potential to Achieve Goals: Good    Frequency Min 3X/week   Barriers to discharge        Co-evaluation               AM-PAC PT "6 Clicks" Daily Activity  Outcome Measure Difficulty turning over in bed (including adjusting bedclothes, sheets and blankets)?: Unable Difficulty moving from lying on back to sitting on the side of the bed? : Unable Difficulty sitting down on and standing up from a chair with arms (e.g., wheelchair, bedside commode, etc,.)?: Unable Help needed moving to and from a bed to chair (including a wheelchair)?: Total Help needed walking in hospital room?: Total Help needed climbing 3-5 steps with a railing? : Total 6 Click Score: 6    End of Session Equipment Utilized During Treatment: Gait belt Activity Tolerance: Patient tolerated treatment well Patient left: in bed Nurse Communication: Mobility status(called portable to deliver the baratric recliner and BSC to pateints room so it canbe ready when pt ready to try stadning and trasnfering ) PT Visit Diagnosis: Other abnormalities of gait and mobility (R26.89);Muscle weakness (generalized) (M62.81)    Time: 1120-1204 PT Time Calculation (min) (ACUTE ONLY): 44 min   Charges:   PT Evaluation $PT Eval Low Complexity: 1 Low PT Treatments $Therapeutic Exercise: 23-37 mins        .Clide Dales, PT Acute Rehabilitation Services Pager: [phone] Office:  [phone] 04/01/2018   Clide Dales 04/01/2018, 2:05 PM

## 2018-04-01 NOTE — Plan of Care (Signed)
  Problem: Pain Managment: Goal: General experience of comfort will improve Outcome: Progressing   

## 2018-04-02 DIAGNOSIS — M25562 Pain in left knee: Secondary | ICD-10-CM | POA: Diagnosis not present

## 2018-04-02 LAB — GLUCOSE, CAPILLARY
GLUCOSE-CAPILLARY: 133 mg/dL — AB (ref 70–99)
GLUCOSE-CAPILLARY: 188 mg/dL — AB (ref 70–99)
Glucose-Capillary: 157 mg/dL — ABNORMAL HIGH (ref 70–99)
Glucose-Capillary: 180 mg/dL — ABNORMAL HIGH (ref 70–99)

## 2018-04-02 MED ORDER — PREDNISONE 50 MG PO TABS
60.0000 mg | ORAL_TABLET | Freq: Every day | ORAL | Status: DC
  Administered 2018-04-03 – 2018-04-04 (×2): 60 mg via ORAL
  Filled 2018-04-02 (×2): qty 1

## 2018-04-02 MED ORDER — ENOXAPARIN SODIUM 120 MG/0.8ML ~~LOC~~ SOLN
120.0000 mg | SUBCUTANEOUS | Status: DC
  Administered 2018-04-02 – 2018-04-03 (×2): 120 mg via SUBCUTANEOUS
  Filled 2018-04-02 (×2): qty 0.8

## 2018-04-02 NOTE — Plan of Care (Signed)

## 2018-04-02 NOTE — Clinical Social Work Note (Signed)
Clinical Social Work Assessment  Patient Details  Name: Ronnie Hernandez MRN: [id number] Date of Birth: 1960/06/30  Date of referral:  04/02/18               Reason for consult:  Facility Placement                Permission sought to share information with:  Facility Art therapist granted to share information::  Yes, Verbal Permission Granted  Name::     [facility]  Agency::  SNF  Relationship::  Niece  Contact Information:  [phone]  Housing/Transportation Living arrangements for the past 2 months:  [place] of Information:  Patient Patient Interpreter Needed:  None Criminal Activity/Legal Involvement Pertinent to Current Situation/Hospitalization:  No - Comment as needed Significant Relationships:  Other Family Members, Friend Lives with:  Relatives Do you feel safe going back to the place where you live?  Yes Need for family participation in patient care:  No (Coment)  Care giving concerns:  Patient is 57 y/o male with medical history significant of history of pulmonary embolism, HTN, gout, asthma, obesity, T2DM and multiple other medical problems presenting with L knee pain. He was unable to stand for 1 week PTA due to sharp pain and swollen L knee. Patient lives with niece and will need short term rehab to regain strength before returning home.   Social Worker assessment / plan: CSW met with patient at bedside to discuss plan for discharge. Patient reports he lives at home with niece and her children. He has a friend who transports him to appointments. He was unable to walk for 1 week PTA and acknowledges need for rehab to regain strength. He states PT has been helpful so far during this admission.  Patient uses a cane and rollator walker at home. He also has a wheelchair to use as needed. Patient has questions regarding obtaining at hospital bed. CSW will make RNCM aware.  Patient has previously been to [facility] and  prefers to return there. He is open to other facilities in [place] area if [facility] is not an option.  CSW explained insurance authorization process, will complete FL2, and send out referrals.    Employment status:    Insurance informationEducational psychologist PT Recommendations:  [place] / Referral to community resources:  [place]  Patient/Family's Response to care:  Patient is pleasant and appreciative to CSW assisting with SNF placement. He states he feels much better since he has been inpatient and wants to continue regaining strength at rehab.  Patient/Family's Understanding of and Emotional Response to Diagnosis, Current Treatment, and Prognosis:  Patient understands SNF process. He has been to SNF before and knows what to expect.  Emotional Assessment Appearance:  Appears older than stated age Attitude/Demeanor/Rapport:  Engaged, Charismatic Affect (typically observed):  Adaptable, Accepting, Pleasant, Appropriate Orientation:  Oriented to  Time, Oriented to Place, Oriented to Situation, Oriented to Self Alcohol / Substance use:  Not Applicable Psych involvement (Current and /or in the community):  No (Comment)  Discharge Needs  Concerns to be addressed:  Care Coordination Readmission within the last 30 days:  No Current discharge risk:  Physical Impairment Barriers to Discharge:  [place] 04/02/2018, 2:35 PM

## 2018-04-02 NOTE — Progress Notes (Signed)
PROGRESS NOTE    Ronnie Hernandez  KCL:[id number] DOB: 16-Dec-1960 DOA: 03/31/2018 PCP: [name], NP   Brief Narrative: Patient is a 57 year old male with past medical history significant for pulmonary embolism not on anticoagulant, hypertension, gout, asthma, obesity who presented to the emergency department with complaints of worsening left knee pain.  The pain started about a week ago.  He has been not able to ambulate since a week.  Patient admitted for the management of gout flare.  Assessment & Plan:   Active Problems:   Left knee pain  Left knee pain:Has H/O gout.  Low suspicion for septic arthritis. left knee mildly swollen and tender on presentation.  Has not been able to bear on the left lower extremity since a week due to pain.  X-rays of the knee showed degenerative changes with suspected small joint effusion.  Similar admission in October 2018.  Requested for PT/OT evaluation who recommended SNF. Left knee pain has significantly improved this morning.  Gout:Patient is on allopurinol at home.  Continue colchicine.  Started on IV steroids but will be changed to oral from tomorrow.  Continue allopurinol with increased dose of 300 mg twice a day. Patient has tophies.  He has degenerative changes/deformities on the fingers.  He needs to follow-up with a rheumatologist as an outpatient and will provide resources for that.  Asthma: Currently stable.  Continue his home inhaler  Hyperlipidemia: Continue Lipitor  Hypertension: Currently blood pressure stable.  Continue lisinopril and Lasix  History of heart failure with preserved ejection fraction: Continue Lasix  Type 2 diabetes mellitus: Continue sliding-scale insulin.  Not on medication at home.  Hemoglobin A1c of 5.8.  History of PE: Not on anticoagulation  Morbid obesity: Counseled on the importance of diet and exercise  DVT prophylaxis: Lovenox Code Status: Full Family Communication: None at the  bedside Disposition Plan: SNF as soon as the bed is available  Consultants: None  Procedures: None  Antimicrobials: None  Subjective:  Patient seen and examined the bedside this morning.  His knee pain has been significantly better this  morning. Remains comfortable.  Objective: Vitals:   04/01/18 0513 04/01/18 1434 04/01/18 2200 04/02/18 0600  BP: (!) 112/50 (!) 120/51 (!) 125/57 (!) 143/72  Pulse: 93 83 79 78  Resp: 16 20 18 18   Temp: 98 F (36.7 C) 98.3 F (36.8 C) 98.5 F (36.9 C) 97.6 F (36.4 C)  TempSrc: Oral Oral Oral Oral  SpO2: 97% 92% 97% 96%  Weight: (!) 240.9 kg     Height:        Intake/Output Summary (Last 24 hours) at 04/02/2018 1324 Last data filed at 04/02/2018 0900 Gross per 24 hour  Intake 600 ml  Output 1600 ml  Net -1000 ml   Filed Weights   03/31/18 1130 04/01/18 0513  Weight: (!) 215.5 kg (!) 240.9 kg    Examination:  General exam: Appears calm and comfortable ,Not in distress, morbidly obese HEENT:PERRL,Oral mucosa moist, Ear/Nose normal on gross exam Respiratory system: Bilateral equal air entry, normal vesicular breath sounds, no wheezes or crackles  Cardiovascular system: S1 & S2 heard, RRR. No JVD, murmurs, rubs, gallops or clicks. No pedal edema. Gastrointestinal system: Abdomen is nondistended, soft and nontender. No organomegaly or masses felt. Normal bowel sounds heard. Central nervous system: Alert and oriented. No focal neurological deficits. Extremities: No edema, no clubbing ,no cyanosis, distal peripheral pulses palpable. Improvement in the swelling and tenderness of the left knee, no erythema Deformities on  the fingers of upper extremities, scattered tophies Skin: No rashes, lesions or ulcers,no icterus ,no pallor MSK: Normal muscle bulk,tone ,power Psychiatry: Judgement and insight appear normal. Mood & affect appropriate.     Data Reviewed: I have personally reviewed following labs and imaging studies  CBC: Recent  Labs  Lab 03/31/18 1232 04/01/18 0626  WBC 8.9 10.2  NEUTROABS 6.3  --   HGB 12.4* 12.5*  HCT 39.4 39.9  MCV 96.3 95.2  PLT 279 563   Basic Metabolic Panel: Recent Labs  Lab 03/31/18 1232 04/01/18 0626  NA 138 138  K 4.0 3.8  CL 100 99  CO2 26 27  GLUCOSE 106* 180*  BUN 20 24*  CREATININE 0.83 0.98  CALCIUM 8.9 8.9   GFR: Estimated Creatinine Clearance: 158.3 mL/min (by C-G formula based on SCr of 0.98 mg/dL). Liver Function Tests: Recent Labs  Lab 04/01/18 0626  AST 32  ALT 31  ALKPHOS 102  BILITOT 0.6  PROT 7.0  ALBUMIN 2.6*   No results for input(s): LIPASE, AMYLASE in the last 168 hours. No results for input(s): AMMONIA in the last 168 hours. Coagulation Profile: No results for input(s): INR, PROTIME in the last 168 hours. Cardiac Enzymes: No results for input(s): CKTOTAL, CKMB, CKMBINDEX, TROPONINI in the last 168 hours. BNP (last 3 results) No results for input(s): PROBNP in the last 8760 hours. HbA1C: Recent Labs    03/31/18 2020  HGBA1C 5.8*   CBG: Recent Labs  Lab 04/01/18 1649 04/02/18 0725 04/02/18 1211  GLUCAP 184* 157* 133*   Lipid Profile: No results for input(s): CHOL, HDL, LDLCALC, TRIG, CHOLHDL, LDLDIRECT in the last 72 hours. Thyroid Function Tests: No results for input(s): TSH, T4TOTAL, FREET4, T3FREE, THYROIDAB in the last 72 hours. Anemia Panel: No results for input(s): VITAMINB12, FOLATE, FERRITIN, TIBC, IRON, RETICCTPCT in the last 72 hours. Sepsis Labs: No results for input(s): PROCALCITON, LATICACIDVEN in the last 168 hours.  No results found for this or any previous visit (from the past 240 hour(s)).       Radiology Studies: No results found.      Scheduled Meds: . allopurinol  300 mg Oral BID  . atorvastatin  10 mg Oral q1800  . colchicine  0.6 mg Oral BID  . diclofenac sodium  2 g Topical QID  . enoxaparin (LOVENOX) injection  120 mg Subcutaneous Q24H  . furosemide  40 mg Oral Daily  . lisinopril  2.5  mg Oral Daily  . polyethylene glycol  17 g Oral Daily  . [START ON 04/03/2018] predniSONE  60 mg Oral Q breakfast  . senna  1 tablet Oral BID   Continuous Infusions:   LOS: 0 days    Time spent: 25 mins.More than 50% of that time was spent in counseling and/or coordination of care.      Shelly Coss, MD [facility] Pager [phone]  If 7PM-7AM, please contact night-coverage [email or web address] Password [email or web address] 04/02/2018, 1:24 PM

## 2018-04-02 NOTE — NC FL2 (Signed)
[place] LEVEL OF CARE SCREENING TOOL     IDENTIFICATION  Patient Name: Ronnie Hernandez Birthdate: 25-Nov-1960 Sex: male Admission Date (Current Location): 03/31/2018  [facility] and [place] Number:  Herbalist and Address:  [facility],  [place] [address] [place]      Provider Number: [id number]  Attending Physician Name and Address:  Shelly Coss, MD  Relative Name and Phone Number:  Hatim Homann: [phone]    Current Level of Care: Hospital Recommended Level of Care: [place] Prior Approval Number:    Date Approved/Denied:   PASRR Number: [id number]  Discharge Plan: SNF    Current Diagnoses: Patient Active Problem List   Diagnosis Date Noted  . Left knee pain 03/31/2018  . Knee pain   . Acute gout 04/18/2017  . Pulmonary embolus ([place]) 08/05/2016  . Hyperglycemia, drug-induced 07/08/2016  . Impaired glucose tolerance 07/08/2016  . Leukocytosis 07/06/2016  . Unable to walk 07/06/2016  . Acute gouty arthritis 07/06/2016  . Abnormality of gait 02/18/2016  . Cellulitis of left lower extremity 01/27/2016  . Type 2 diabetes mellitus ([place]) 12/25/2013  . Drug-induced gout 12/25/2013  . Hyperlipidemia 08/01/2013  . Morbid obesity ([place])   . Hypertension   . Diabetes type 2, uncontrolled ([place])   . Gout   . Colon cancer screening 07/27/2011  . Benign neoplasm of colon 07/27/2011    Orientation RESPIRATION BLADDER Height & Weight     Self, Time, Situation, Place  Normal Incontinent Weight: (!) 531 lb (240.9 kg) Height:  5\' 6"  (167.6 cm)  BEHAVIORAL SYMPTOMS/MOOD NEUROLOGICAL BOWEL NUTRITION STATUS      Continent Diet(Carb modified)  AMBULATORY STATUS COMMUNICATION OF NEEDS Skin   Extensive Assist Verbally Normal                       Personal Care Assistance Level of Assistance  Bathing, Feeding, Dressing Bathing Assistance: Maximum assistance Feeding assistance:  Independent Dressing Assistance: Maximum assistance     Functional Limitations Info  Sight, Hearing, Speech Sight Info: Adequate Hearing Info: Adequate Speech Info: Adequate    SPECIAL CARE FACTORS FREQUENCY  OT (By licensed OT), PT (By licensed PT)     PT Frequency: 5x/week OT Frequency: 5x/week            Contractures Contractures Info: Not present    Additional Factors Info  Code Status, Allergies Code Status Info: Full Allergies Info: ULORIC FEBUXOSTAT            Current Medications (04/02/2018):  This is the current hospital active medication list Current Facility-Administered Medications  Medication Dose Route Frequency Provider Last Rate Last Dose  . acetaminophen (TYLENOL) tablet 650 mg  650 mg Oral Q8H PRN Elodia Florence., MD      . albuterol (PROVENTIL) (2.5 MG/3ML) 0.083% nebulizer solution 2.5 mg  2.5 mg Nebulization Q6H PRN Elodia Florence., MD      . allopurinol (ZYLOPRIM) tablet 300 mg  300 mg Oral BID Shelly Coss, MD   300 mg at 04/02/18 0912  . atorvastatin (LIPITOR) tablet 10 mg  10 mg Oral q1800 Elodia Florence., MD   10 mg at 04/01/18 1737  . colchicine tablet 0.6 mg  0.6 mg Oral BID Shelly Coss, MD   0.6 mg at 04/02/18 0913  . diclofenac sodium (VOLTAREN) 1 % transdermal gel 2 g  2 g Topical QID Elodia Florence., MD   2  g at 04/02/18 1424  . enoxaparin (LOVENOX) injection 120 mg  120 mg Subcutaneous Q24H Adhikari, Amrit, MD      . furosemide (LASIX) tablet 40 mg  40 mg Oral Daily Elodia Florence., MD   40 mg at 04/02/18 0913  . lisinopril (PRINIVIL,ZESTRIL) tablet 2.5 mg  2.5 mg Oral Daily Elodia Florence., MD   2.5 mg at 04/02/18 0913  . oxyCODONE (Oxy IR/ROXICODONE) immediate release tablet 5 mg  5 mg Oral Q6H PRN Elodia Florence., MD      . polyethylene glycol [facility] / GLYCOLAX) packet 17 g  17 g Oral Daily Elodia Florence., MD   17 g at 04/01/18 1028  . [START ON 04/03/2018] predniSONE  (DELTASONE) tablet 60 mg  60 mg Oral Q breakfast Shelly Coss, MD      . senna (SENOKOT) tablet 8.6 mg  1 tablet Oral BID Elodia Florence., MD   8.6 mg at 04/01/18 2209  . trolamine salicylate (ASPERCREME) 10 % cream 1 application  1 application Topical BID PRN Elodia Florence., MD         Discharge Medications: Please see discharge summary for a list of discharge medications.  Relevant Imaging Results:  Relevant Lab Results:   Additional Information SSN: [id number]  Pricilla Holm, [place]

## 2018-04-03 DIAGNOSIS — M25562 Pain in left knee: Secondary | ICD-10-CM | POA: Diagnosis not present

## 2018-04-03 LAB — GLUCOSE, CAPILLARY
GLUCOSE-CAPILLARY: 142 mg/dL — AB (ref 70–99)
GLUCOSE-CAPILLARY: 146 mg/dL — AB (ref 70–99)
GLUCOSE-CAPILLARY: 180 mg/dL — AB (ref 70–99)
Glucose-Capillary: 211 mg/dL — ABNORMAL HIGH (ref 70–99)
Glucose-Capillary: 226 mg/dL — ABNORMAL HIGH (ref 70–99)

## 2018-04-03 NOTE — Progress Notes (Signed)
BP=155/66. Patient has history of HTN; medication was given as ordered. Will monitor patient and recheck BP.

## 2018-04-03 NOTE — Progress Notes (Signed)
CSW contacted [facility] to start authorization- CSW will continue to follow up with facility for insurance authorization.   Kingsley Spittle, [place]  [phone]

## 2018-04-03 NOTE — Progress Notes (Signed)
PROGRESS NOTE    Ronnie Hernandez  CZY:[id number] DOB: [birth year] DOA: 03/31/2018 PCP: [name], NP   Brief Narrative: Patient is a 57 year old male with past medical history significant for pulmonary embolism not on anticoagulant, hypertension, gout, asthma, obesity who presented to the emergency department with complaints of worsening left knee pain.  The pain started about a week ago.  He has been not able to ambulate since a week.  Patient admitted for the management of gout flare.  Assessment & Plan:   Active Problems:   Left knee pain  Left knee pain:Has H/O gout.  Low suspicion for septic arthritis. left knee mildly swollen and tender on presentation.  Has not been able to bear on the left lower extremity since a week due to pain.  X-rays of the knee showed degenerative changes with suspected small joint effusion.  Similar admission in October 2018.  Requested for PT/OT evaluation who recommended SNF. Left knee pain has significantly improved this morning.  Gout:Patient is on allopurinol at home.  Continue colchicine.  Started on IV steroids but will be changed to oral from tomorrow.  Continue allopurinol with increased dose of 300 mg twice a day. Patient has tophies.  He has degenerative changes/deformities on the fingers.  He needs to follow-up with a rheumatologist as an outpatient and will provide resources for that.  Asthma: Currently stable.  Continue his home inhaler  Hyperlipidemia: Continue Lipitor  Hypertension: Currently blood pressure stable.  Continue lisinopril and Lasix  History of heart failure with preserved ejection fraction: Continue Lasix  Type 2 diabetes mellitus: Continue sliding-scale insulin.  Not on medication at home.  Hemoglobin A1c of 5.8.  History of PE: Not on anticoagulation  Morbid obesity: Counseled on the importance of diet and exercise  Patient is stable for discharge to skilled nursing facility as soon as the bed is  available.  DVT prophylaxis: Lovenox Code Status: Full Family Communication: None at the bedside Disposition Plan: SNF as soon as the bed is available  Consultants: None  Procedures: None  Antimicrobials: None  Subjective:  Patient seen and examined the bedside this morning.  His knee pain has been significantly better this  morning. Remains comfortable.Very happy  Objective: Vitals:   04/02/18 1412 04/02/18 1958 04/03/18 0521 04/03/18 0900  BP: (!) 142/70 132/60 (!) 143/77 139/80  Pulse: 83 80 75   Resp: (!) 22 (!) 22 20   Temp: 98.7 F (37.1 C) 97.9 F (36.6 C) 97.8 F (36.6 C)   TempSrc:   Oral   SpO2: 93% 97% 97%   Weight:   (!) 245.8 kg   Height:        Intake/Output Summary (Last 24 hours) at 04/03/2018 1256 Last data filed at 04/02/2018 0932 Gross per 24 hour  Intake 360 ml  Output 350 ml  Net 10 ml   Filed Weights   03/31/18 1130 04/01/18 0513 04/03/18 0521  Weight: (!) 215.5 kg (!) 240.9 kg (!) 245.8 kg    Examination:  General exam: Appears calm and comfortable ,Not in distress, morbidly obese HEENT:PERRL,Oral mucosa moist, Ear/Nose normal on gross exam Respiratory system: Bilateral equal air entry, normal vesicular breath sounds, no wheezes or crackles  Cardiovascular system: S1 & S2 heard, RRR. No JVD, murmurs, rubs, gallops or clicks. No pedal edema. Gastrointestinal system: Abdomen is nondistended, soft and nontender. No organomegaly or masses felt. Normal bowel sounds heard. Central nervous system: Alert and oriented. No focal neurological deficits. Extremities: No edema, no clubbing ,  no cyanosis, distal peripheral pulses palpable. Improvement in the swelling and tenderness of the left knee, no erythema Deformities on the fingers of upper extremities, scattered tophies Skin: No rashes, lesions or ulcers,no icterus ,no pallor MSK: Normal muscle bulk,tone ,power Psychiatry: Judgement and insight appear normal. Mood & affect appropriate.      Data Reviewed: I have personally reviewed following labs and imaging studies  CBC: Recent Labs  Lab 03/31/18 1232 04/01/18 0626  WBC 8.9 10.2  NEUTROABS 6.3  --   HGB 12.4* 12.5*  HCT 39.4 39.9  MCV 96.3 95.2  PLT 279 662   Basic Metabolic Panel: Recent Labs  Lab 03/31/18 1232 04/01/18 0626  NA 138 138  K 4.0 3.8  CL 100 99  CO2 26 27  GLUCOSE 106* 180*  BUN 20 24*  CREATININE 0.83 0.98  CALCIUM 8.9 8.9   GFR: Estimated Creatinine Clearance: 160.7 mL/min (by C-G formula based on SCr of 0.98 mg/dL). Liver Function Tests: Recent Labs  Lab 04/01/18 0626  AST 32  ALT 31  ALKPHOS 102  BILITOT 0.6  PROT 7.0  ALBUMIN 2.6*   No results for input(s): LIPASE, AMYLASE in the last 168 hours. No results for input(s): AMMONIA in the last 168 hours. Coagulation Profile: No results for input(s): INR, PROTIME in the last 168 hours. Cardiac Enzymes: No results for input(s): CKTOTAL, CKMB, CKMBINDEX, TROPONINI in the last 168 hours. BNP (last 3 results) No results for input(s): PROBNP in the last 8760 hours. HbA1C: Recent Labs    03/31/18 2020  HGBA1C 5.8*   CBG: Recent Labs  Lab 04/02/18 1211 04/02/18 1642 04/02/18 2017 04/03/18 0811 04/03/18 1203  GLUCAP 133* 180* 188* 142* 146*   Lipid Profile: No results for input(s): CHOL, HDL, LDLCALC, TRIG, CHOLHDL, LDLDIRECT in the last 72 hours. Thyroid Function Tests: No results for input(s): TSH, T4TOTAL, FREET4, T3FREE, THYROIDAB in the last 72 hours. Anemia Panel: No results for input(s): VITAMINB12, FOLATE, FERRITIN, TIBC, IRON, RETICCTPCT in the last 72 hours. Sepsis Labs: No results for input(s): PROCALCITON, LATICACIDVEN in the last 168 hours.  No results found for this or any previous visit (from the past 240 hour(s)).       Radiology Studies: No results found.      Scheduled Meds: . allopurinol  300 mg Oral BID  . atorvastatin  10 mg Oral q1800  . colchicine  0.6 mg Oral BID  .  diclofenac sodium  2 g Topical QID  . enoxaparin (LOVENOX) injection  120 mg Subcutaneous Q24H  . furosemide  40 mg Oral Daily  . lisinopril  2.5 mg Oral Daily  . polyethylene glycol  17 g Oral Daily  . predniSONE  60 mg Oral Q breakfast  . senna  1 tablet Oral BID   Continuous Infusions:   LOS: 0 days    Time spent: 25 mins.More than 50% of that time was spent in counseling and/or coordination of care.      Shelly Coss, MD [facility] Pager [phone]  If 7PM-7AM, please contact night-coverage [email or web address] Password [email or web address] 04/03/2018, 12:56 PM

## 2018-04-03 NOTE — Progress Notes (Signed)
Physical Therapy Treatment Patient Details Name: Ronnie Hernandez MRN: [id number] DOB: Nov 15, 1960 Today's Date: 04/03/2018    History of Present Illness Ronnie Hernandez is a 57 y.o. male with medical history significant of history of pulmonary embolism, HTN, gout, asthma, obesity, T2DM and multiple other medical problems presenting with L knee pain.     PT Comments    The  Patient reports that the left leg is feeling better. Patient declined to sit at bed edge dueto diarrhea earlier. Plan to sit and stand next visit.   Follow Up Recommendations  SNF     Equipment Recommendations  None recommended by PT    Recommendations for Other Services  OT     Precautions / Restrictions Precautions Precautions: Fall Precaution Comments: obesity Restrictions Weight Bearing Restrictions: No    Mobility  Bed Mobility               General bed mobility comments: patient able to rock and move self up in the bed.   Transfers                 General transfer comment: declined, has had diarrhea  Ambulation/Gait                 Stairs             Wheelchair Mobility    Modified Rankin (Stroke Patients Only)       Balance                                            Cognition Arousal/Alertness: Awake/alert                                            Exercises General Exercises - Lower Extremity Short Arc Quad: AROM;20 reps;Both Heel Slides: AAROM;Both;AROM;20 reps;Supine Hip ABduction/ADduction: AAROM;AROM;Both;10 reps;20 reps Straight Leg Raises: AAROM;AROM;Both;10 reps;Supine Other Exercises Other Exercises: , IR/ER rotation at the hip, modifies bridging on each side, x 20 reps    General Comments        Pertinent Vitals/Pain Pain Score: 2  Pain Location: L knee  Pain Descriptors / Indicators: Discomfort Pain Intervention(s): Monitored during session    Home Living                       Prior Function            PT Goals (current goals can now be found in the care plan section) Progress towards PT goals: Progressing toward goals    Frequency    Min 2X/week      PT Plan Current plan remains appropriate;Frequency needs to be updated    Co-evaluation              AM-PAC PT "6 Clicks" Daily Activity  Outcome Measure  Difficulty turning over in bed (including adjusting bedclothes, sheets and blankets)?: Unable Difficulty moving from lying on back to sitting on the side of the bed? : Unable Difficulty sitting down on and standing up from a chair with arms (e.g., wheelchair, bedside commode, etc,.)?: Unable Help needed moving to and from a bed to chair (including a wheelchair)?: Total Help needed walking in hospital room?: Total Help needed climbing 3-5 steps with a railing? : Total  6 Click Score: 6    End of Session   Activity Tolerance: Patient tolerated treatment well Patient left: in bed Nurse Communication: Mobility status;Need for lift equipment(until able to establish standing) PT Visit Diagnosis: Other abnormalities of gait and mobility (R26.89);Muscle weakness (generalized) (M62.81)     Time: 4142-3953 PT Time Calculation (min) (ACUTE ONLY): 34 min  Charges:  $Therapeutic Exercise: 23-37 mins                     Tresa Endo PT Acute Rehabilitation Services Pager [phone] Office [phone]    Claretha Cooper 04/03/2018, 4:45 PM

## 2018-04-04 ENCOUNTER — Ambulatory Visit: Payer: Medicare HMO | Admitting: Nurse Practitioner

## 2018-04-04 DIAGNOSIS — Z743 Need for continuous supervision: Secondary | ICD-10-CM | POA: Diagnosis not present

## 2018-04-04 DIAGNOSIS — M25569 Pain in unspecified knee: Secondary | ICD-10-CM | POA: Diagnosis not present

## 2018-04-04 DIAGNOSIS — R279 Unspecified lack of coordination: Secondary | ICD-10-CM | POA: Diagnosis not present

## 2018-04-04 DIAGNOSIS — M25562 Pain in left knee: Secondary | ICD-10-CM | POA: Diagnosis not present

## 2018-04-04 DIAGNOSIS — R269 Unspecified abnormalities of gait and mobility: Secondary | ICD-10-CM | POA: Diagnosis not present

## 2018-04-04 LAB — GLUCOSE, CAPILLARY
GLUCOSE-CAPILLARY: 147 mg/dL — AB (ref 70–99)
Glucose-Capillary: 162 mg/dL — ABNORMAL HIGH (ref 70–99)
Glucose-Capillary: 141 mg/dL — ABNORMAL HIGH (ref 70–99)

## 2018-04-04 MED ORDER — ALLOPURINOL 300 MG PO TABS: 300.0000 mg | ORAL_TABLET | Freq: Two times a day (BID) | ORAL | 0 refills | Status: DC

## 2018-04-04 MED ORDER — POLYETHYLENE GLYCOL 3350 17 G PO PACK: 17.0000 g | PACK | Freq: Every day | ORAL | 0 refills | Status: DC

## 2018-04-04 MED ORDER — PREDNISONE 10 MG PO TABS: ORAL_TABLET | ORAL | 0 refills | Status: DC

## 2018-04-04 MED ORDER — COLCHICINE 0.6 MG PO TABS: 0.6000 mg | ORAL_TABLET | Freq: Every day | ORAL | 0 refills | Status: AC

## 2018-04-04 NOTE — Evaluation (Addendum)
Occupational Therapy Evaluation Patient Details Name: Ronnie Hernandez MRN: [id number] DOB: [birth year] Today's Date: 04/04/2018    History of Present Illness GINO GARRABRANT is a 57 y.o. male with medical history significant of history of pulmonary embolism, HTN, gout, asthma, obesity, T2DM and multiple other medical problems presenting with L knee pain.    Clinical Impression   Pt was admitted for the above.  At baseline, he is mod I with adls except for showering; he has an aide 2x week for this.  Pt got up to chair with +2 assist today; needed +3 at one point as he started to slide forward on mattress.    Pt plans home as he cannot afford copay for SNF.  Will follow in acute setting with min A +2 to min guard level goals. Recommend continued OT after discharge to reach PLOF     Follow Up Recommendations  Home health OT;Supervision/Assistance - 24 hour(would benefit from snf; can't afford copay per pt)    Equipment Recommendations  None recommended by OT    Recommendations for Other Services       Precautions / Restrictions Precautions Precautions: Fall Precaution Comments: obesity Restrictions Weight Bearing Restrictions: No      Mobility Bed Mobility   Bed Mobility: Supine to Sit     Supine to sit: Max assist     General bed mobility comments: patient initiated legs to bed edge and partially rolled to sit up. required max assist to completely sit upright. Able to scoot self to bed edge. Patient began to slide forward and required 3 assist to get back onto bed.mattress deflated with less risk for sliding.    Transfers Overall transfer level: Needs assistance Equipment used: Rolling walker (2 wheeled) Transfers: Sit to/from [facility] Sit to Stand: +2 physical assistance;+2 safety/equipment;Min assist         General transfer comment: legs are very far apart due to body habitus. Steady assist to rise from the bed leaning on RW. Patient slowly  shifted weight to get  feet a little closer but never able to fit inside RW. patient slowly shifted weight    to L/R leg in order to turn to get infront of the recliner.  The patient stood x 3 more times from the recliner with min assist. with very wide base and leaning forward over the RW.    Balance                                           ADL either performed or assessed with clinical judgement   ADL Overall ADL's : Needs assistance/impaired Eating/Feeding: Independent   Grooming: Set up   Upper Body Bathing: Set up   Lower Body Bathing: Moderate assistance;+2 for safety/equipment;+2 for physical assistance;Sit to/from stand   Upper Body Dressing : Set up;Sitting   Lower Body Dressing: Maximal assistance;+2 for safety/equipment;Sit to/from stand   Toilet Transfer: Minimal assistance;+2 for safety/equipment;+2 for physical assistance;Stand-pivot;RW(to chair)   Toileting- Clothing Manipulation and Hygiene: Maximal assistance;+2 for safety/equipment;Sit to/from stand         General ADL Comments: Pt states that he can normally dress himself from chair with set up.  He used to have a reacher but no longer has this.  Issued one as he has tight finances.  Shoes are slip on.  Pt did not want to built up handles for utensils  and toothbrush as he can manage at this time     Vision         Perception     Praxis      Pertinent Vitals/Pain Pain Score: 1  Pain Location: L knee  Pain Descriptors / Indicators: Discomfort Pain Intervention(s): Monitored during session     Hand Dominance     Extremity/Trunk Assessment Upper Extremity Assessment Upper Extremity Assessment: (grossly 4/5; swann  neck deformities BIl)  Pt has theraband/HEP from PT           Communication Communication Communication: No difficulties   Cognition Arousal/Alertness: Awake/alert                                         General Comments       Exercises     Shoulder Instructions      Home Living Family/patient expects to be discharged to:: Private residence Living Arrangements: Other relatives Available Help at Discharge: Family;Friend(s) Type of Home: Apartment             Bathroom Shower/Tub: Walk-in Corporate treasurer Toilet: Handicapped height     Home Equipment: Environmental consultant - 4 wheels;Bedside commode;Tub bench;Cane - quad;Grab bars - tub/shower;Grab bars - toilet;Hand held shower head;Adaptive equipment          Prior Functioning/Environment Level of Independence: Independent with assistive device(s)        Comments: aide assists with shower 2x week        OT Problem List: Decreased strength;Decreased activity tolerance;Decreased knowledge of use of DME or AE;Pain      OT Treatment/Interventions: Self-care/ADL training;DME and/or AE instruction;Therapeutic activities;Patient/family education;Balance training    OT Goals(Current goals can be found in the care plan section) Acute Rehab OT Goals Patient Stated Goal: I want to get back on my feet again.  OT Goal Formulation: With patient Time For Goal Achievement: 04/18/18 Potential to Achieve Goals: Good ADL Goals Pt Will Perform Grooming: with min guard assist;standing Pt Will Perform Lower Body Dressing: with min guard assist;sit to/from stand(with AE) Pt Will Transfer to Toilet: with min guard assist;with +2 assist;ambulating;bedside commode Pt Will Perform Toileting - Clothing Manipulation and hygiene: with min assist;sitting/lateral leans;sit to/from stand  OT Frequency: Min 2X/week   Barriers to D/C:            Co-evaluation   Reason for Co-Treatment: Complexity of the patient's impairments (multi-system involvement);For patient/therapist safety;To address functional/ADL transfers PT goals addressed during session: Mobility/safety with mobility OT goals addressed during session: ADL's and self-care      AM-PAC PT "6 Clicks" Daily Activity     Outcome  Measure Help from another person eating meals?: None Help from another person taking care of personal grooming?: A Little Help from another person toileting, which includes using toliet, bedpan, or urinal?: A Lot Help from another person bathing (including washing, rinsing, drying)?: A Lot Help from another person to put on and taking off regular upper body clothing?: A Little Help from another person to put on and taking off regular lower body clothing?: A Lot 6 Click Score: 16   End of Session    Activity Tolerance: Patient tolerated treatment well Patient left: in chair;with call bell/phone within reach  OT Visit Diagnosis: Muscle weakness (generalized) (M62.81)                Time: 8250-5397 OT Time Calculation (  min): 34 min Charges:  OT General Charges $OT Visit: 1 Visit OT Evaluation $OT Eval Low Complexity: [place], OTR/L Acute Rehabilitation Services [phone] [facility] pager [phone] office 04/04/2018  [place] 04/04/2018, 11:42 AM

## 2018-04-04 NOTE — Discharge Summary (Signed)
Physician Discharge Summary  BRYLEN WAGAR OIN:[id number] DOB: October 03, 1960 DOA: 03/31/2018  PCP: [name], NP  Admit date: 03/31/2018 Discharge date: 04/04/2018  Admitted From: Home Disposition:  Home  Discharge Condition:Stable CODE STATUS:FULL Diet recommendation: Heart Healthy  Brief/Interim Summary:  Patient is a 57 year old male with past medical history significant for pulmonary embolism not on anticoagulant, hypertension, gout, asthma, obesity who presented to the emergency department with complaints of worsening left knee pain.  The pain started about a week ago.  He has been not able to ambulate since a week.  Patient admitted for the management of gout flare. Patient was started on IV steroids on admission and immediately patient started to feel better.  IV steroids have been tapered to oral.  Patient was evaluated physical therapy and recommended skilled nursing facility but he could not be discharged to skilled nursing facility because of payment issues.  Patient will be discharged home with home health.  Following problems were addressed during his hospitalization:   Left knee pain:Has H/O gout.  Low suspicion for septic arthritis. left knee mildly swollen and tender on presentation.  Has not been able to bear on the left lower extremity since a week due to pain.  X-rays of the knee showed degenerative changes with suspected small joint effusion.  Similar admission in October 2018.  Requested for PT/OT evaluation who recommended SNF but couldnot be discharged due to insurance issues Left knee pain has significantly improved since last few days.  Gout:Patient is on allopurinol at home.  Continue colchicine.  Started on IV steroids but will be changed to oral .Continue allopurinol with increased dose of 300 mg twice a day. Patient has tophies.  He has degenerative changes/deformities on the fingers.  He needs to follow-up with a rheumatologist as an outpatient and  will provide resources for that.  Asthma: Currently stable.  Continue his home inhaler  Hyperlipidemia: Continue Lipitor  Hypertension: Currently blood pressure stable.  Continue lisinopril and Lasix  History of heart failure with preserved ejection fraction: Continue Lasix  Type 2 diabetes mellitus: Continue sliding-scale insulin.  Not on medication at home.  Hemoglobin A1c of 5.8.  History of PE: Not on anticoagulation  Morbid obesity: Counseled on the importance of diet and exercise   Discharge Diagnoses:  Active Problems:   Left knee pain    Discharge Instructions  Discharge Instructions    Diet - low sodium heart healthy   Complete by:  As directed    Discharge instructions   Complete by:  As directed    1) Please take prescribed medications as instructed. 2)Please follow up with your PCP in a week. 3)Follow up with Rheumatology in a 1-2 weeks. Name and number of the provider has been attached.   Increase activity slowly   Complete by:  As directed      Allergies as of 04/04/2018      Reactions   Uloric [febuxostat] Other (See Comments)   Made the skin on feet and legs "hurt"      Medication List    TAKE these medications   acetaminophen 650 MG CR tablet Commonly known as:  TYLENOL Take 650 mg by mouth every 8 (eight) hours as needed for pain.   albuterol 108 (90 Base) MCG/ACT inhaler Commonly known as:  PROVENTIL HFA;VENTOLIN HFA Inhale 1-2 puffs into the lungs every 6 (six) hours as needed for wheezing or shortness of breath.   allopurinol 300 MG tablet Commonly known as:  ZYLOPRIM Take 1  tablet (300 mg total) by mouth 2 (two) times daily. What changed:  See the new instructions.   atorvastatin 10 MG tablet Commonly known as:  LIPITOR Take 1 tablet (10 mg total) by mouth daily at 6 PM.   BIOFREEZE 4 % Gel Generic drug:  Menthol (Topical Analgesic) Apply 1 application topically 4 (four) times daily as needed (pain).   colchicine 0.6 MG  tablet Take 1 tablet (0.6 mg total) by mouth daily.   diclofenac sodium 1 % Gel Commonly known as:  VOLTAREN Apply 2 g topically 4 (four) times daily. What changed:    when to take this  reasons to take this   furosemide 20 MG tablet Commonly known as:  LASIX TAKE 2 TABLETS BY MOUTH EVERY DAY   lisinopril 2.5 MG tablet Commonly known as:  PRINIVIL,ZESTRIL TAKE 1 TABLET BY MOUTH ONCE DAILY   nystatin-triamcinolone ointment Commonly known as:  MYCOLOG Apply 1 application topically 2 (two) times daily.   oxyCODONE-acetaminophen 5-325 MG tablet Commonly known as:  PERCOCET/ROXICET Take 1 tablet by mouth every 4 (four) hours as needed for severe pain.   polyethylene glycol packet Commonly known as:  MIRALAX / GLYCOLAX Take 17 g by mouth daily. Start taking on:  04/05/2018   predniSONE 10 MG tablet Commonly known as:  DELTASONE Take 60 mg daily for 2 days then 40 mg daily for 3 days then 20 mg daily for 3 days then 10 mg daily for 3 days then stop Start taking on:  04/05/2018 What changed:    medication strength  how much to take  how to take this  when to take this  additional instructions   trolamine salicylate 10 % cream Commonly known as:  ASPERCREME Apply 1 application topically 2 (two) times daily as needed for muscle pain.      [place] Follow up.   Contact information: [place] [address] [phone]        [name], NP. Schedule an appointment as soon as possible for a visit in 1 week(s).   Specialty:  Geriatric Medicine Contact information: [place] [place] [address] [phone]        Lahoma Rocker, MD. Schedule an appointment as soon as possible for a visit in 1 week(s).   Specialty:  Rheumatology Contact information: [place]  [address] [phone]          Allergies  Allergen Reactions  . Uloric  [Febuxostat] Other (See Comments)    Made the skin on feet and legs "hurt"    Consultations:  None   Procedures/Studies: Dg Knee Complete 4 Views Left  Result Date: 03/31/2018 CLINICAL DATA:  Left knee pain with inability to move left leg x 1 week Gout? No known injury *Best obtainable images due to pt condition/ habitus EXAM: LEFT KNEE - COMPLETE 4+ VIEW COMPARISON:  None. FINDINGS: Study quality is degraded by patient body habitus.There is narrowing of the MEDIAL and patellofemoral compartments, associated with osteophytes and sclerosis. No acute fracture or subluxation. Suspect small joint effusion. IMPRESSION: 1. Degenerative changes. 2. Joint effusion. Electronically Signed   By: Nolon Nations M.D.   On: 03/31/2018 12:16       Subjective: Patient seen and examined at bedside this morning.  Remains very comfortable.  Left knee pain has significantly improved.  Stable for discharge today.  Discharge Exam: Vitals:   04/03/18 2009 04/04/18 1443  BP: 130/65 124/71  Pulse: 79 70  Resp: 16 15  Temp: 97.8 F (36.6 C) (!) 97.5 F (36.4 C)  SpO2: 95% 98%   Vitals:   04/03/18 1521 04/03/18 1600 04/03/18 2009 04/04/18 0442  BP: (!) 123/56 138/68 130/65 124/71  Pulse: 69  79 70  Resp:   16 15  Temp:   97.8 F (36.6 C) (!) 97.5 F (36.4 C)  TempSrc:   Oral Oral  SpO2:   95% 98%  Weight:      Height:        General: Pt is alert, awake, not in acute distress,obese Cardiovascular: RRR, S1/S2 +, no rubs, no gallops Respiratory: CTA bilaterally, no wheezing, no rhonchi Abdominal: Soft, NT, ND, bowel sounds + Extremities: no edema, no cyanosis,scaterred trophies,deformities of the fingers    The results of significant diagnostics from this hospitalization (including imaging, microbiology, ancillary and laboratory) are listed below for reference.     Microbiology: No results found for this or any previous visit (from the past 240 hour(s)).   Labs: BNP (last 3  results) Recent Labs    11/15/17 1349 03/31/18 1230  BNP 51.7 25.9   Basic Metabolic Panel: Recent Labs  Lab 03/31/18 1232 04/01/18 0626  NA 138 138  K 4.0 3.8  CL 100 99  CO2 26 27  GLUCOSE 106* 180*  BUN 20 24*  CREATININE 0.83 0.98  CALCIUM 8.9 8.9   Liver Function Tests: Recent Labs  Lab 04/01/18 0626  AST 32  ALT 31  ALKPHOS 102  BILITOT 0.6  PROT 7.0  ALBUMIN 2.6*   No results for input(s): LIPASE, AMYLASE in the last 168 hours. No results for input(s): AMMONIA in the last 168 hours. CBC: Recent Labs  Lab 03/31/18 1232 04/01/18 0626  WBC 8.9 10.2  NEUTROABS 6.3  --   HGB 12.4* 12.5*  HCT 39.4 39.9  MCV 96.3 95.2  PLT 279 295   Cardiac Enzymes: No results for input(s): CKTOTAL, CKMB, CKMBINDEX, TROPONINI in the last 168 hours. BNP: Invalid input(s): POCBNP CBG: Recent Labs  Lab 04/03/18 1723 04/03/18 1918 04/03/18 2313 04/04/18 0357 04/04/18 0757  GLUCAP 211* 226* 180* 147* 162*   D-Dimer No results for input(s): DDIMER in the last 72 hours. Hgb A1c No results for input(s): HGBA1C in the last 72 hours. Lipid Profile No results for input(s): CHOL, HDL, LDLCALC, TRIG, CHOLHDL, LDLDIRECT in the last 72 hours. Thyroid function studies No results for input(s): TSH, T4TOTAL, T3FREE, THYROIDAB in the last 72 hours.  Invalid input(s): FREET3 Anemia work up No results for input(s): VITAMINB12, FOLATE, FERRITIN, TIBC, IRON, RETICCTPCT in the last 72 hours. Urinalysis    Component Value Date/Time   COLORURINE YELLOW 11/15/2017 1141   APPEARANCEUR CLEAR 11/15/2017 1141   APPEARANCEUR Clear 12/05/2015 1226   LABSPEC 1.021 11/15/2017 1141   PHURINE 5.0 11/15/2017 1141   GLUCOSEU NEGATIVE 11/15/2017 1141   HGBUR SMALL (A) 11/15/2017 1141   BILIRUBINUR NEGATIVE 11/15/2017 1141   BILIRUBINUR Negative 12/05/2015 1226   KETONESUR NEGATIVE 11/15/2017 1141   PROTEINUR NEGATIVE 11/15/2017 1141   UROBILINOGEN 0.2 12/28/2013 1117   NITRITE POSITIVE  (A) 11/15/2017 1141   LEUKOCYTESUR NEGATIVE 11/15/2017 1141   LEUKOCYTESUR Negative 12/05/2015 1226   Sepsis Labs Invalid input(s): PROCALCITONIN,  WBC,  LACTICIDVEN Microbiology No results found for this or any previous visit (from the past 240 hour(s)).  Please note: You were cared for by a hospitalist during your hospital stay. Once you are discharged, your  primary care physician will handle any further medical issues. Please note that NO REFILLS for any discharge medications will be authorized once you are discharged, as it is imperative that you return to your primary care physician (or establish a relationship with a primary care physician if you do not have one) for your post hospital discharge needs so that they can reassess your need for medications and monitor your lab values.    Time coordinating discharge: 40 minutes  SIGNED:   Shelly Coss, MD  [facility] 04/04/2018, 11:18 AM Pager [phone]  If 7PM-7AM, please contact night-coverage [email or web address] Password [email or web address]

## 2018-04-04 NOTE — Progress Notes (Addendum)
Inpatient Diabetes Program Recommendations  AACE/ADA: New Consensus Statement on Inpatient Glycemic Control (2015)  Target Ranges:  Prepandial:   less than 140 mg/dL      Peak postprandial:   less than 180 mg/dL (1-2 hours)      Critically ill patients:  140 - 180 mg/dL   Results for JJ, ENYEART" (MRN [id number]) as of 04/04/2018 09:10  Ref. Range 04/03/2018 08:11 04/03/2018 12:03 04/03/2018 17:23 04/03/2018 19:18  Glucose-Capillary Latest Ref Range: 70 - 99 mg/dL 142 (H) 146 (H) 211 (H) 226 (H)   Results for TRESEAN, MATTIX" (MRN [id number]) as of 04/04/2018 09:10  Ref. Range 04/03/2018 23:13 04/04/2018 03:57 04/04/2018 07:57  Glucose-Capillary Latest Ref Range: 70 - 99 mg/dL 180 (H) 147 (H) 162 (H)    Admit with: Left knee pain  History: DM2  Home DM Meds: None  Current Orders: CBG checks TID AC     MD- Please consider placing orders for Novolog Sensitive Correction Scale/ SSI (0-9 units) TID AC + HS  Has orders for CBG checks but No SSI coverage ordered     --Will follow patient during hospitalization--  Wyn Quaker RN, MSN, CDE Diabetes Coordinator Inpatient Glycemic Control Team Team Pager: [phone] (8a-5p)

## 2018-04-04 NOTE — Care Management Note (Addendum)
Case Management Note  Patient Details  Name: Ronnie Hernandez MRN: [id number] Date of Birth: 09-23-1960  Subjective/Objective:                  Discharge Planning Discharged to home with hhc through advance and dme-wide rolling walker through [facility] Action/Plan: [facility] in p[lace for hhc needs/Karen alerted of possible dc. Home via [facility]tar called for transport at 1445 Expected Discharge Date:  (unknown)               Expected Discharge Plan:  [facility]  In-House Referral:  Clinical Social Work  Discharge planning Services  CM Consult  Post Acute Care Choice:    Choice offered to:  Patient  DME Arranged:    DME Agency:     HH Arranged:  RN, PT, OT, Nurse's Aide, Social Work [employer] Agency:  [facility]  Status of Service:  Completed, signed off  If discussed at [employer], dates discussed:    Additional Comments:  Leeroy Cha, RN 04/04/2018, 10:25 AM

## 2018-04-04 NOTE — Progress Notes (Signed)
Physical Therapy Treatment Patient Details Name: Ronnie Hernandez MRN: [id number] DOB: 02-26-1961 Today's Date: 04/04/2018    History of Present Illness Ronnie Hernandez is a 57 y.o. male with medical history significant of history of pulmonary embolism, HTN, gout, asthma, obesity, T2DM and multiple other medical problems presenting with L knee pain.     PT Comments    The patient required 3 assist to safely stand and pivot to recliner. The patient is not at his baseline of  Apartment distance ambulator with a quad cane. If DC is impending, The  Patient will benefit from nonemergency  Ambulance transport home as patient is not at a  Functional level to get into a friend's  adapted Lucianne Lei that he usually uses . The patient does have a WC but it does not fit through doorways at home. He reports that he has to fold it up, walk through the door and then get into WC. Continue PT efforts for safe  Mobility.  Patient can benefit from more acute care PT visits to ensure safe transition back to his home.   Follow Up Recommendations  SNF;Home health PT     Equipment Recommendations  (extra wide RW with 2 wheels.)    Recommendations for Other Services       Precautions / Restrictions Precautions Precautions: Fall Precaution Comments: obesity Restrictions Weight Bearing Restrictions: No    Mobility  Bed Mobility   Bed Mobility: Supine to Sit     Supine to sit: Max assist     General bed mobility comments: patient initiated legs to bed edge and partially rolled to sit up. required max assist to completely sit upright. Able to scoot self to bed edge. Patient began to slide forward and required 3 assist to get back onto bed.mattress deflated with less risk for sliding.  B  Transfers Overall transfer level: Needs assistance Equipment used: Rolling walker (2 wheeled) Transfers: Sit to/from [facility] Sit to Stand: +2 physical assistance;+2 safety/equipment;Mod assist          General transfer comment: In sitting and standing,legs are very far apart due to body habitus. Steady assist to rise from the bed with 2 mod assist with patient  leaning on RW. Patient slowly shifted weight to get  feet a little closer but never able to fit inside RW. Patient slowly shifted weight    to L/R leg in order to turn to get in front of the recliner.  The patient stood x 3 more times from the recliner with min assist. with very wide base and leaning forward over the RW.  Ambulation/Gait             General Gait Details: attempted to take  a step forward but  unable to advance, siting the left leg was feeling weak.   Stairs             Wheelchair Mobility    Modified Rankin (Stroke Patients Only)       Balance                                            Cognition Arousal/Alertness: Awake/alert  Exercises General Exercises - Lower Extremity Long Arc Quad: AROM;Both;15 reps;Seated Hip Flexion/Marching: AROM;Both;15 reps;Seated    General Comments        Pertinent Vitals/Pain Pain Score: 1  Pain Location: L knee  Pain Descriptors / Indicators: Discomfort Pain Intervention(s): Monitored during session    Home Living Family/patient expects to be discharged to:: Private residence Living Arrangements: Other relatives Available Help at Discharge: Family;Friend(s) Type of Home: Apartment       Home Equipment: Walker - 4 wheels;Bedside commode;Tub bench;Cane - quad;Grab bars - tub/shower;Grab bars - toilet;Hand held shower head;Adaptive equipment      Prior Function            PT Goals (current goals can now be found in the care plan section) Progress towards PT goals: Progressing toward goals    Frequency    Min 3X/week      PT Plan Current plan remains appropriate;Frequency needs to be updated    Co-evaluation PT/OT/SLP Co-Evaluation/Treatment:  Yes Reason for Co-Treatment: Complexity of the patient's impairments (multi-system involvement);For patient/therapist safety;To address functional/ADL transfers PT goals addressed during session: Mobility/safety with mobility OT goals addressed during session: ADL's and self-care      AM-PAC PT "6 Clicks" Daily Activity  Outcome Measure  Difficulty turning over in bed (including adjusting bedclothes, sheets and blankets)?: Unable Difficulty moving from lying on back to sitting on the side of the bed? : Unable Difficulty sitting down on and standing up from a chair with arms (e.g., wheelchair, bedside commode, etc,.)?: Unable Help needed moving to and from a bed to chair (including a wheelchair)?: Total Help needed walking in hospital room?: Total Help needed climbing 3-5 steps with a railing? : Total 6 Click Score: 6    End of Session Equipment Utilized During Treatment: Gait belt Activity Tolerance: Patient tolerated treatment well Patient left: in chair;with call bell/phone within reach Nurse Communication: Mobility status PT Visit Diagnosis: Other abnormalities of gait and mobility (R26.89);Muscle weakness (generalized) (M62.81)     Time: 2130-8657 PT Time Calculation (min) (ACUTE ONLY): 38 min  Charges:  $Therapeutic Activity: 23-37 mins                     Tresa Endo PT Acute Rehabilitation Services Pager [phone] Office [phone]    Claretha Cooper 04/04/2018, 11:32 AM

## 2018-04-04 NOTE — Progress Notes (Signed)
CSW followed up with [facility] about patient offer and insurance authorization. Staff member [name] reported that they are unable to accept patient.   CSW followed up with patient's bed offer ([facility]) and spoke with staff member [name]. Staff confirmed bed offer and reported that they are out of network with patient's insurance so patient would be responsible for 40% of cost.  CSW provided update to patient. Patient declined SNF bed offer reporting that he is unable to pay for 40% of cost at SNF. Patient verbalized plan to discharge home. Patient agreeable to home health services. Patient reported that he will have a friend pick him up.   CSW updated patient's RNCM to follow up with patient regarding home health service needs.   CSW signing off, no other needs identified at this time.   [name], [place] Social Worker [facility] Cell#: [phone]

## 2018-04-05 ENCOUNTER — Telehealth: Payer: Self-pay

## 2018-04-05 DIAGNOSIS — I5030 Unspecified diastolic (congestive) heart failure: Secondary | ICD-10-CM | POA: Diagnosis not present

## 2018-04-05 DIAGNOSIS — E119 Type 2 diabetes mellitus without complications: Secondary | ICD-10-CM | POA: Diagnosis not present

## 2018-04-05 DIAGNOSIS — T50905D Adverse effect of unspecified drugs, medicaments and biological substances, subsequent encounter: Secondary | ICD-10-CM | POA: Diagnosis not present

## 2018-04-05 DIAGNOSIS — M19049 Primary osteoarthritis, unspecified hand: Secondary | ICD-10-CM | POA: Diagnosis not present

## 2018-04-05 DIAGNOSIS — I110 Hypertensive heart disease with heart failure: Secondary | ICD-10-CM | POA: Diagnosis not present

## 2018-04-05 DIAGNOSIS — M1712 Unilateral primary osteoarthritis, left knee: Secondary | ICD-10-CM | POA: Diagnosis not present

## 2018-04-05 DIAGNOSIS — J449 Chronic obstructive pulmonary disease, unspecified: Secondary | ICD-10-CM | POA: Diagnosis not present

## 2018-04-05 DIAGNOSIS — M1020 Drug-induced gout, unspecified site: Secondary | ICD-10-CM | POA: Diagnosis not present

## 2018-04-05 DIAGNOSIS — E785 Hyperlipidemia, unspecified: Secondary | ICD-10-CM | POA: Diagnosis not present

## 2018-04-05 NOTE — Telephone Encounter (Signed)
Patient returned call. I offered to schedule a TOC appointment and patient refused stating he is too weak to consider leaving the house. Patient is currently receiving therapy in home and admits to having low endurance. Patient aware I will share this information with Clarise Cruz and she will follow-up with him.   S.Chrae B/CMA

## 2018-04-05 NOTE — Telephone Encounter (Signed)
I have made the 1st attempt to contact the patient or family member in charge, in order to follow up from recently being discharged from the hospital. I left a message on voicemail but I will make another attempt at a different time.  

## 2018-04-06 ENCOUNTER — Telehealth: Payer: Self-pay

## 2018-04-06 NOTE — Telephone Encounter (Signed)
During TCM call patient stated need for shower bench. Please order this DME if able to. If you have in the past already please let me know

## 2018-04-06 NOTE — Telephone Encounter (Signed)
Transition Care Management Follow-up Telephone Call  Date of discharge and from where: [facility] on 04/04/2018  How have you been since you were released from the hospital? Therapy 2x/week and nurse coming out to house. No more gout pain since using cream   Any questions or concerns? No   Items Reviewed:  Did the pt receive and understand the discharge instructions provided? Yes   Medications obtained and verified? Yes   Any new allergies since your discharge? No   Dietary orders reviewed? Yes  Do you have support at home? Yes , niece lives with him with her two children  Other (ie: DME, Home Health, etc) Has bedside commode and need new shower bench. Provider notified to order.  Functional Questionnaire: (I = Independent and D = Dependent) ADL's: I w/ assist  Bathing/Dressing- I w/ assist   Meal Prep- I w/ assist  Eating- I  Maintaining continence- I w/ use of bedside commode  Transferring/Ambulation- I w/ walker, WC  Managing Meds- I    Follow up appointments reviewed:    PCP Hospital f/u appt confirmed? No . Patient is too weak to come out now. He would need to be in a wheelchair and the application process for transportation help takes about a month. By then he said he would be feeling much better and will call to make appointment  Specialist Hospital f/u appt confirmed? No  . Same as above  Are transportation arrangements needed? N/A  If their condition worsens, is the pt aware to call  their PCP or go to the ED? No  Was the patient provided with contact information for the PCP's office or ED? Yes  Was the pt encouraged to call back with questions or concerns? Yes

## 2018-04-06 NOTE — Telephone Encounter (Signed)
Okay, he said he will call when he feels able to come in for a follow up visit and it can be ordered then.Thanks!

## 2018-04-06 NOTE — Telephone Encounter (Signed)
He would need an office visit to be able to order DME

## 2018-04-10 DIAGNOSIS — E785 Hyperlipidemia, unspecified: Secondary | ICD-10-CM | POA: Diagnosis not present

## 2018-04-10 DIAGNOSIS — I110 Hypertensive heart disease with heart failure: Secondary | ICD-10-CM | POA: Diagnosis not present

## 2018-04-10 DIAGNOSIS — I5030 Unspecified diastolic (congestive) heart failure: Secondary | ICD-10-CM | POA: Diagnosis not present

## 2018-04-10 DIAGNOSIS — M1712 Unilateral primary osteoarthritis, left knee: Secondary | ICD-10-CM | POA: Diagnosis not present

## 2018-04-10 DIAGNOSIS — T50905D Adverse effect of unspecified drugs, medicaments and biological substances, subsequent encounter: Secondary | ICD-10-CM | POA: Diagnosis not present

## 2018-04-10 DIAGNOSIS — J449 Chronic obstructive pulmonary disease, unspecified: Secondary | ICD-10-CM | POA: Diagnosis not present

## 2018-04-10 DIAGNOSIS — M19049 Primary osteoarthritis, unspecified hand: Secondary | ICD-10-CM | POA: Diagnosis not present

## 2018-04-10 DIAGNOSIS — E119 Type 2 diabetes mellitus without complications: Secondary | ICD-10-CM | POA: Diagnosis not present

## 2018-04-10 DIAGNOSIS — M1020 Drug-induced gout, unspecified site: Secondary | ICD-10-CM | POA: Diagnosis not present

## 2018-04-17 ENCOUNTER — Ambulatory Visit (INDEPENDENT_AMBULATORY_CARE_PROVIDER_SITE_OTHER): Payer: Medicare HMO | Admitting: Nurse Practitioner

## 2018-04-17 ENCOUNTER — Encounter: Payer: Self-pay | Admitting: Nurse Practitioner

## 2018-04-17 VITALS — BP 152/86 | HR 98 | Temp 98.7°F | Ht 67.0 in | Wt >= 6400 oz

## 2018-04-17 DIAGNOSIS — E785 Hyperlipidemia, unspecified: Secondary | ICD-10-CM | POA: Diagnosis not present

## 2018-04-17 DIAGNOSIS — M1A9XX1 Chronic gout, unspecified, with tophus (tophi): Secondary | ICD-10-CM | POA: Diagnosis not present

## 2018-04-17 DIAGNOSIS — Z6841 Body Mass Index (BMI) 40.0 and over, adult: Secondary | ICD-10-CM | POA: Diagnosis not present

## 2018-04-17 DIAGNOSIS — E669 Obesity, unspecified: Secondary | ICD-10-CM

## 2018-04-17 DIAGNOSIS — R600 Localized edema: Secondary | ICD-10-CM

## 2018-04-17 DIAGNOSIS — E1169 Type 2 diabetes mellitus with other specified complication: Secondary | ICD-10-CM

## 2018-04-17 NOTE — Progress Notes (Signed)
Careteam: [name], NP as PCP - General (Nurse Practitioner)  Advanced Directive information Does Patient Have a Medical Advance Directive?: No  Allergies  Allergen Reactions  . Uloric [Febuxostat] Other (See Comments)    Made the skin on feet and legs "hurt"    Chief Complaint  Patient presents with  . Transitions Of Care    Pt was recently admitted to [facility] 10/11 to 10/15. Pt wants to discuss [facility] referral to help with showering. Pt also needs new larger wheelchair and a larger shower chair from [place].      HPI: Patient is a 57 y.o. male seen in the office today for hospital follow up.  He has a past medical history significant for pulmonary embolism not on anticoagulant, hypertension, gout, asthma, obesity who presented to the emergency department with complaints of worsening left knee pain.He had not been able to ambulate for a week. Patient admitted for the management of gout flare. Patient was started on IV steroids on admission and immediately patient started to feel better.  IV steroids have been tapered to oral.  Patient was evaluated physical therapy and recommended skilled nursing facility but he could not be discharged to skilled nursing facility because of payment issues.  Patient will be discharged home with home health. Continues on prednisone taper (2 days left) and pain has been well controlled. Not using oxycodone/apap- this was on medication list.   Gout:Patient is on allopurinol at home and this was increased to 300 mg BID. Continue colchicine 0.6 daily. Started on IV steroids but will be changed to oral and now tapering dose.  It was recommended to follow up with a rheumatologist however does not wish to go to at this time because it is hard for him to get around. (agreeable to go by the end of appt)  Hyperlipidemia: Continue Lipitor, LDL 82 in July.   Hypertension: Currently blood pressure  stable, on lisinopril and Lasix  History of heart failure with preserved ejection fraction: Continued on Lasix, no swelling at this time .  Type 2 diabetes mellitus:Hemoglobin A1c of 5.8. Not currently on medication   History of PE: completed course of anticoagulation  Morbid obesity: ongoing has had numerous encounters forCounseling on the importance of diet and exercise  Home health comes out twice a week. He needs a shower bench and a bigger wheel chair for mobility.  He also request help getting in and out of the shower. (home health aid)  Feeling weaker with pain in the left knee. ?if he needs injections. Has grab bars but would like assistance.  Review of Systems:  Review of Systems  Constitutional: Negative for chills, fever and weight loss.  Respiratory: Negative for cough, sputum production and shortness of breath.   Cardiovascular: Positive for leg swelling (improved). Negative for chest pain and palpitations.  Gastrointestinal: Negative for abdominal pain, constipation, diarrhea and heartburn.  Genitourinary: Negative for dysuria, frequency and urgency.  Musculoskeletal: Positive for joint pain and myalgias. Negative for back pain and falls.  Neurological: Negative for dizziness and headaches.  Psychiatric/Behavioral: Negative for depression and memory loss. The patient does not have insomnia.     Past Medical History:  Diagnosis Date  . Arthritis    "knees, hands" (04/19/2017)  . Childhood asthma   . Diabetes type 2, uncontrolled ([place])   . Gout    "have had it in both feet, both hands, right shoulder, back" (04/19/2017)  . Hyperlipidemia   .  Hypertension   . Morbidly obese ([place])   . Pulmonary embolism ([place]) 2017?   Past Surgical History:  Procedure Laterality Date  . COLONOSCOPY W/ BIOPSIES AND POLYPECTOMY  07/2011   Archie Endo 07/27/2011   Social History:   reports that he quit smoking about 36 years ago. He has never used smokeless tobacco. He reports that  he has current or past drug history. He reports that he does not drink alcohol.  Family History  Problem Relation Age of Onset  . Colon cancer Maternal Grandfather 37  . Heart attack Father   . Stomach cancer Maternal Aunt 80    Medications: Patient's Medications  New Prescriptions   No medications on file  Previous Medications   ACETAMINOPHEN (TYLENOL) 650 MG CR TABLET    Take 650 mg by mouth every 8 (eight) hours as needed for pain.   ALLOPURINOL (ZYLOPRIM) 300 MG TABLET    Take 1 tablet (300 mg total) by mouth 2 (two) times daily.   ATORVASTATIN (LIPITOR) 10 MG TABLET    Take 1 tablet (10 mg total) by mouth daily at 6 PM.   COLCHICINE 0.6 MG TABLET    Take 1 tablet (0.6 mg total) by mouth daily.   DICLOFENAC SODIUM (VOLTAREN) 1 % GEL    Apply 2 g topically 4 (four) times daily.   FUROSEMIDE (LASIX) 20 MG TABLET    TAKE 2 TABLETS BY MOUTH EVERY DAY   LISINOPRIL (PRINIVIL,ZESTRIL) 2.5 MG TABLET    TAKE 1 TABLET BY MOUTH ONCE DAILY   MENTHOL, TOPICAL ANALGESIC, (BIOFREEZE) 4 % GEL    Apply 1 application topically 4 (four) times daily as needed (pain).   NYSTATIN-TRIAMCINOLONE OINTMENT (MYCOLOG)    Apply 1 application topically 2 (two) times daily.   PREDNISONE (DELTASONE) 10 MG TABLET    Take 60 mg daily for 2 days then 40 mg daily for 3 days then 20 mg daily for 3 days then 10 mg daily for 3 days then stop   TROLAMINE SALICYLATE (ASPERCREME) 10 % CREAM    Apply 1 application topically 2 (two) times daily as needed for muscle pain.  Modified Medications   No medications on file  Discontinued Medications   ALBUTEROL (PROVENTIL HFA;VENTOLIN HFA) 108 (90 BASE) MCG/ACT INHALER    Inhale 1-2 puffs into the lungs every 6 (six) hours as needed for wheezing or shortness of breath.   OXYCODONE-ACETAMINOPHEN (PERCOCET/ROXICET) 5-325 MG TABLET    Take 1 tablet by mouth every 4 (four) hours as needed for severe pain.   POLYETHYLENE GLYCOL (MIRALAX / GLYCOLAX) PACKET    Take 17 g by mouth daily.      Physical Exam:  Vitals:   04/17/18 1435  BP: (!) 152/86  Pulse: 98  Temp: 98.7 F (37.1 C)  TempSrc: Oral  SpO2: 98%  Weight: (!) 462 lb 12.8 oz (209.9 kg)  Height: 5\' 7"  (1.702 m)   Body mass index is 72.48 kg/m.  Physical Exam  Constitutional: He is oriented to person, place, and time. He appears well-developed and well-nourished.  HENT:  Mouth/Throat: Oropharynx is clear and moist.  Eyes: Pupils are equal, round, and reactive to light. No scleral icterus.  Neck: Neck supple. Muscular tenderness present. Carotid bruit is not present. Decreased range of motion present. No thyromegaly present.  Cardiovascular: Normal rate and regular rhythm. Exam reveals no gallop and no friction rub.  Murmur (1/6 SEM) heard. Pulmonary/Chest: Effort normal and breath sounds normal. He has no wheezes. He has no rales.  He exhibits no tenderness.  Abdominal: Soft. Normal appearance and bowel sounds are normal. There is no hepatomegaly.  obese  Musculoskeletal: He exhibits edema (small and large joints and leg), tenderness and deformity (in small joints to bilateral hands).  Lymphadenopathy:    He has no cervical adenopathy.  Neurological: He is alert and oriented to person, place, and time.  Skin: Skin is warm and dry.  Psychiatric: He has a normal mood and affect. His behavior is normal. Judgment and thought content normal.    Labs reviewed: Basic Metabolic Panel: Recent Labs    12/12/17 0907 03/31/18 1232 04/01/18 0626  NA 139 138 138  K 4.1 4.0 3.8  CL 105 100 99  CO2 24 26 27   GLUCOSE 112* 106* 180*  BUN 19 20 24*  CREATININE 1.10 0.83 0.98  CALCIUM 9.2 8.9 8.9   Liver Function Tests: Recent Labs    09/12/17 1137 12/12/17 0907 04/01/18 0626  AST 32 20 32  ALT 43 23 31  ALKPHOS  --   --  102  BILITOT 0.4 0.5 0.6  PROT 6.8 6.5 7.0  ALBUMIN  --   --  2.6*   No results for input(s): LIPASE, AMYLASE in the last 8760 hours. No results for input(s): AMMONIA in the  last 8760 hours. CBC: Recent Labs    09/12/17 1137 11/15/17 1349 03/31/18 1232 04/01/18 0626  WBC 7.7 6.7 8.9 10.2  NEUTROABS 3,457 3.8 6.3  --   HGB 14.7 13.3 12.4* 12.5*  HCT 43.8 40.5 39.4 39.9  MCV 91.3 95.3 96.3 95.2  PLT 223 233 279 295   Lipid Panel: Recent Labs    09/12/17 1137 12/12/17 0907  CHOL 180 144  HDL 41 45  LDLCALC 118* 82  TRIG 107 85  CHOLHDL 4.4 3.2   TSH: No results for input(s): TSH in the last 8760 hours. A1C: Lab Results  Component Value Date   HGBA1C 5.8 (H) 03/31/2018     Assessment/Plan 1. Gout with tophi Allopurinol has been increased to 300 mg BID, also on colchicine 0.6 - Uric acid - Ambulatory referral to Rheumatology due to frequent flares  - Ambulatory referral to [facility]  2. Body mass index (BMI) of 70 or greater in adult [facility]) -morbidly obese, education provided about need for weight loss due to severe complications. lifestyle modifications encouraged.   3. Class 3 severe obesity due to excess calories with serious comorbidity and body mass index (BMI) greater than or equal to 70 in adult [facility]) As above. Discussed need to lose weight, with lifestyle modifications.   4. Bilateral edema of lower extremity Improved at this time. Continues on lasix 20 mg daily   5. Diabetes mellitus type 2 in obese [facility]) Not currently on medications  6. Hyperlipidemia, unspecified hyperlipidemia type conts on lipitor 10 mg daily, LDL 82 in June, goal <70.  Next appt: 4 months  Teigan Manner K. Elim, [facility] [phone]

## 2018-04-18 ENCOUNTER — Telehealth: Payer: Self-pay

## 2018-04-18 ENCOUNTER — Encounter: Payer: Self-pay | Admitting: Nurse Practitioner

## 2018-04-18 LAB — URIC ACID: URIC ACID, SERUM: 8.9 mg/dL — AB (ref 4.0–8.0)

## 2018-04-18 NOTE — Telephone Encounter (Signed)
He will have to through a specialized company and have another office visit just for motorized wheelchair (this is very specialized and requires very specific documentation)

## 2018-04-18 NOTE — Telephone Encounter (Signed)
I left a message asking that patient call the office to discuss the process for getting a motorized wheelchair.

## 2018-04-18 NOTE — Telephone Encounter (Signed)
Patient called to say that he would rather have a motorized wheelchair rather than just a larger wheelchair. Patient was informed that request would be sent to provider.

## 2018-04-18 NOTE — Telephone Encounter (Signed)
Patient scheduled an appointment for next week.

## 2018-04-18 NOTE — Telephone Encounter (Signed)
Ronnie Hernandez, He needs to contact a motorized wheelchair company prior to appt so we will make sure to have all information included in the appt

## 2018-04-18 NOTE — Telephone Encounter (Signed)
Patient has decided to cancel appointment for motorized wheelchair at this time. He would like to make sure that an order for a larger manual wheelchair and larger shower chair were sent in during his appointment yesterday.

## 2018-04-19 NOTE — Telephone Encounter (Signed)
They were not, physical therapy is to evaluate and let us know exactly what DME he needs

## 2018-04-25 ENCOUNTER — Ambulatory Visit: Payer: Self-pay | Admitting: Nurse Practitioner

## 2018-05-15 NOTE — Progress Notes (Deleted)
Office Visit Note  Patient: Ronnie Hernandez             Date of Birth: 1960/08/11           MRN: [id number]             PCP: [name], NP Referring: [name], NP Visit Date: 05/29/2018 Occupation: @GUAROCC @  Subjective:  No chief complaint on file.   History of Present Illness: Ronnie Hernandez is a 57 y.o. male ***   Activities of Daily Living:  Patient reports morning stiffness for *** {minute/hour:19697}.   Patient {ACTIONS;DENIES/REPORTS:21021675::"Denies"} nocturnal pain.  Difficulty dressing/grooming: {ACTIONS;DENIES/REPORTS:21021675::"Denies"} Difficulty climbing stairs: {ACTIONS;DENIES/REPORTS:21021675::"Denies"} Difficulty getting out of chair: {ACTIONS;DENIES/REPORTS:21021675::"Denies"} Difficulty using hands for taps, buttons, cutlery, and/or writing: {ACTIONS;DENIES/REPORTS:21021675::"Denies"}  No Rheumatology ROS completed.   PMFS History:  Patient Active Problem List   Diagnosis Date Noted  . Left knee pain 03/31/2018  . Knee pain   . Acute gout 04/18/2017  . Pulmonary embolus ([place]) 08/05/2016  . Hyperglycemia, drug-induced 07/08/2016  . Impaired glucose tolerance 07/08/2016  . Leukocytosis 07/06/2016  . Unable to walk 07/06/2016  . Acute gouty arthritis 07/06/2016  . Abnormality of gait 02/18/2016  . Cellulitis of left lower extremity 01/27/2016  . Type 2 diabetes mellitus ([place]) 12/25/2013  . Drug-induced gout 12/25/2013  . Hyperlipidemia 08/01/2013  . Morbid obesity ([place])   . Hypertension   . Diabetes type 2, uncontrolled ([place])   . Gout   . Colon cancer screening 07/27/2011  . Benign neoplasm of colon 07/27/2011    Past Medical History:  Diagnosis Date  . Arthritis    "knees, hands" (04/19/2017)  . Childhood asthma   . Diabetes type 2, uncontrolled ([place])   . Gout    "have had it in both feet, both hands, right shoulder, back" (04/19/2017)  . Hyperlipidemia   . Hypertension   . Morbidly obese ([place])   . Pulmonary  embolism ([place]) 2017?    Family History  Problem Relation Age of Onset  . Colon cancer Maternal Grandfather 40  . Heart attack Father   . Stomach cancer Maternal Aunt 80   Past Surgical History:  Procedure Laterality Date  . COLONOSCOPY W/ BIOPSIES AND POLYPECTOMY  07/2011   Archie Endo 07/27/2011   Social History   Social History Narrative  . Not on file    Objective: Vital Signs: There were no vitals taken for this visit.   Physical Exam   Musculoskeletal Exam: ***  CDAI Exam: CDAI Score: Not documented Patient Global Assessment: Not documented; Provider Global Assessment: Not documented Swollen: Not documented; Tender: Not documented Joint Exam   Not documented   There is currently no information documented on the homunculus. Go to the Rheumatology activity and complete the homunculus joint exam.  Investigation: No additional findings. Component     Latest Ref Rng & Units 04/17/2018  Uric Acid, Serum     4.0 - 8.0 mg/dL 8.9 (H)   Imaging: No results found.  Recent Labs: Lab Results  Component Value Date   WBC 10.2 04/01/2018   HGB 12.5 (L) 04/01/2018   PLT 295 04/01/2018   NA 138 04/01/2018   K 3.8 04/01/2018   CL 99 04/01/2018   CO2 27 04/01/2018   GLUCOSE 180 (H) 04/01/2018   BUN 24 (H) 04/01/2018   CREATININE 0.98 04/01/2018   BILITOT 0.6 04/01/2018   ALKPHOS 102 04/01/2018   AST 32 04/01/2018   ALT 31 04/01/2018   PROT 7.0 04/01/2018  ALBUMIN 2.6 (L) 04/01/2018   CALCIUM 8.9 04/01/2018   GFRAA >60 04/01/2018    Speciality Comments: No specialty comments available.  Procedures:  No procedures performed Allergies: Uloric [febuxostat]   Assessment / Plan:     Visit Diagnoses: Idiopathic chronic gout of multiple sites with tophus  History of pulmonary embolism  History of diabetes mellitus, type II  History of hyperlipidemia  Essential hypertension  History of benign neoplasm of colon   Orders: No orders of the defined types were  placed in this encounter.  No orders of the defined types were placed in this encounter.   Face-to-face time spent with patient was *** minutes. Greater than 50% of time was spent in counseling and coordination of care.  Follow-Up Instructions: No follow-ups on file.   Ofilia Neas, PA-C  Note - This record has been created using Dragon software.  Chart creation errors have been sought, but may not always  have been located. Such creation errors do not reflect on  the standard of medical care.

## 2018-05-16 ENCOUNTER — Ambulatory Visit: Payer: Medicare HMO | Admitting: Rheumatology

## 2018-05-29 ENCOUNTER — Ambulatory Visit: Payer: Medicare HMO | Admitting: Rheumatology

## 2018-05-31 IMAGING — CR DG CHEST 2V
2 series · 2 of 2 positions shown · non-contrast
Comparison: 09/13/2006

CLINICAL DATA: Fever and chills, onset today.

EXAM:
CHEST  2 VIEW

[chest lat]
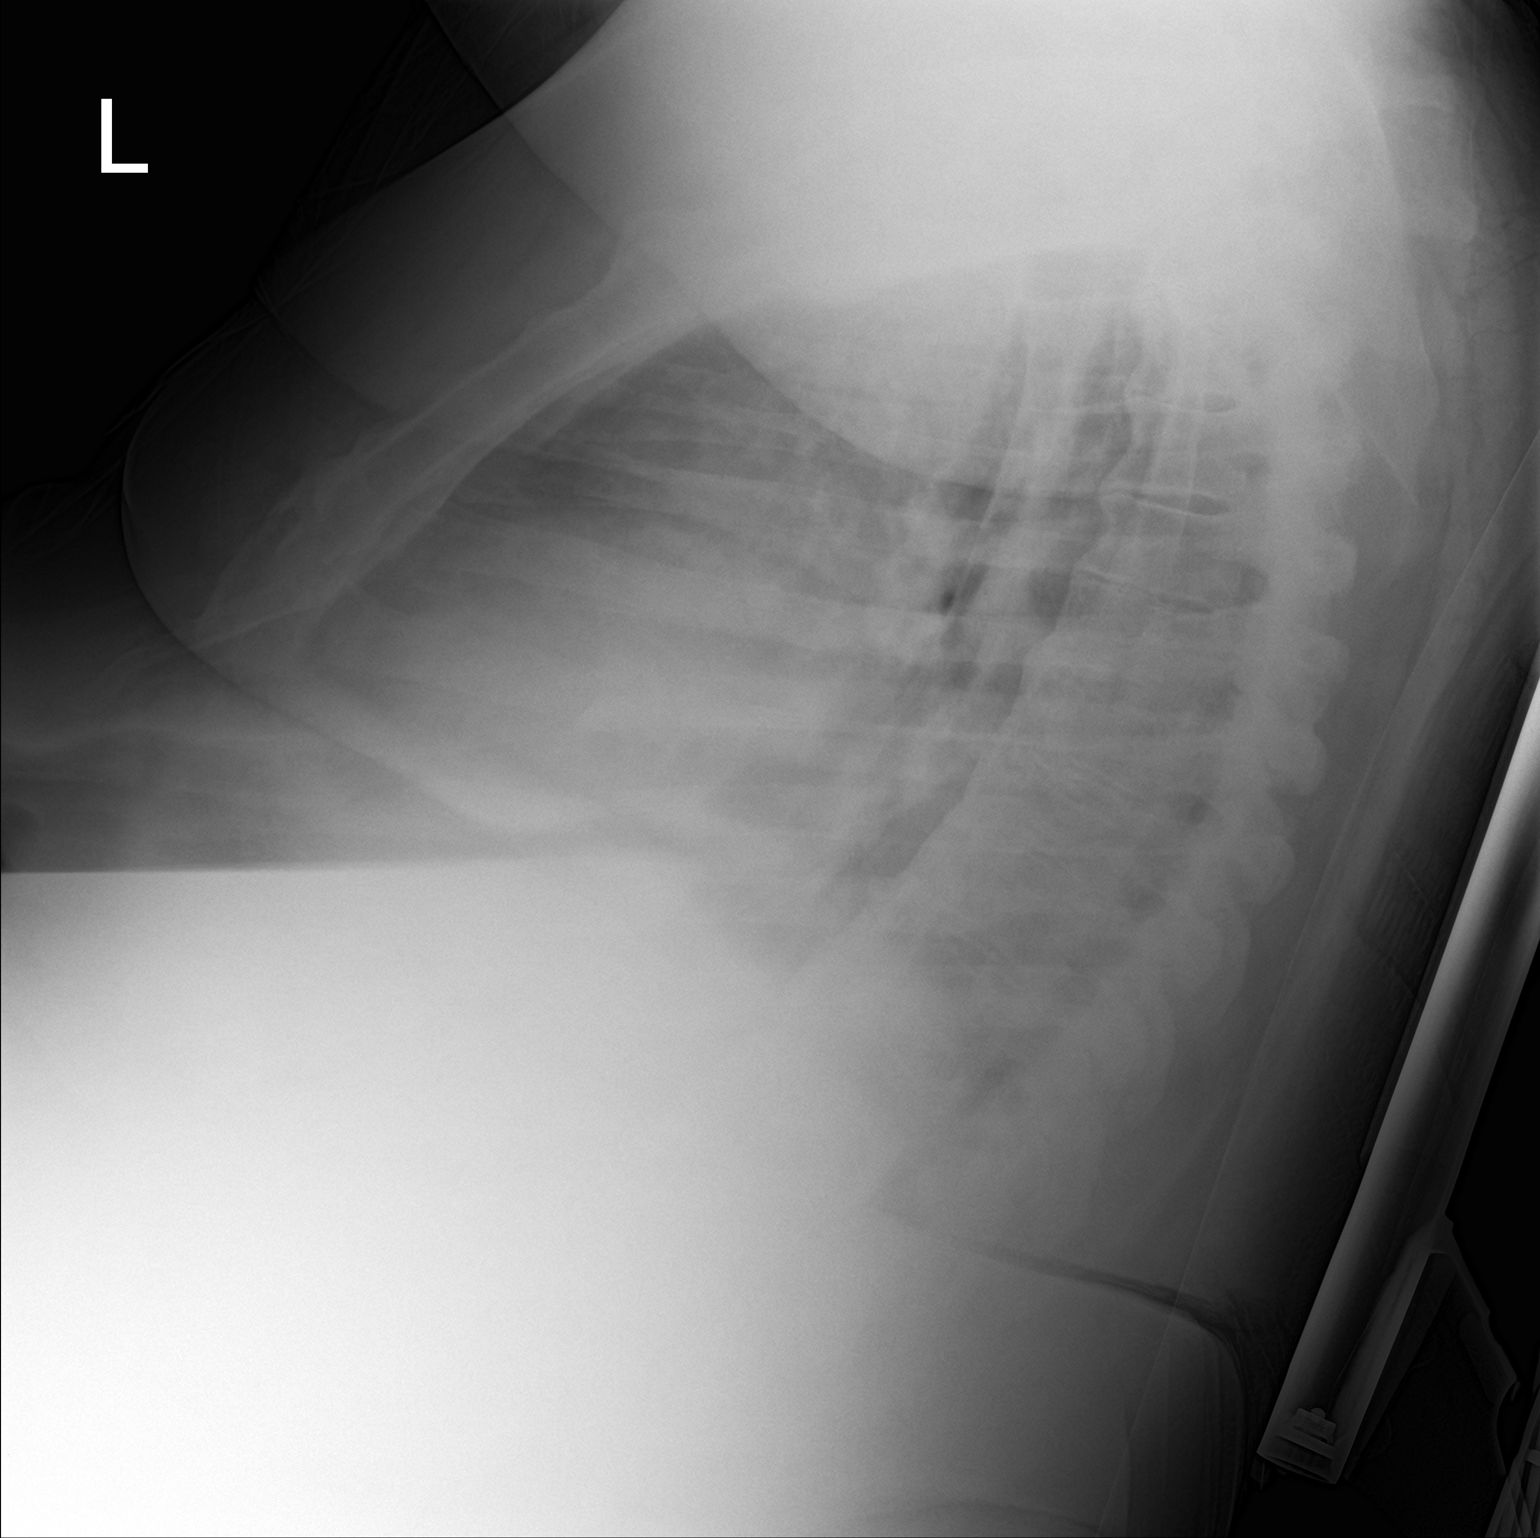

[chest ap]
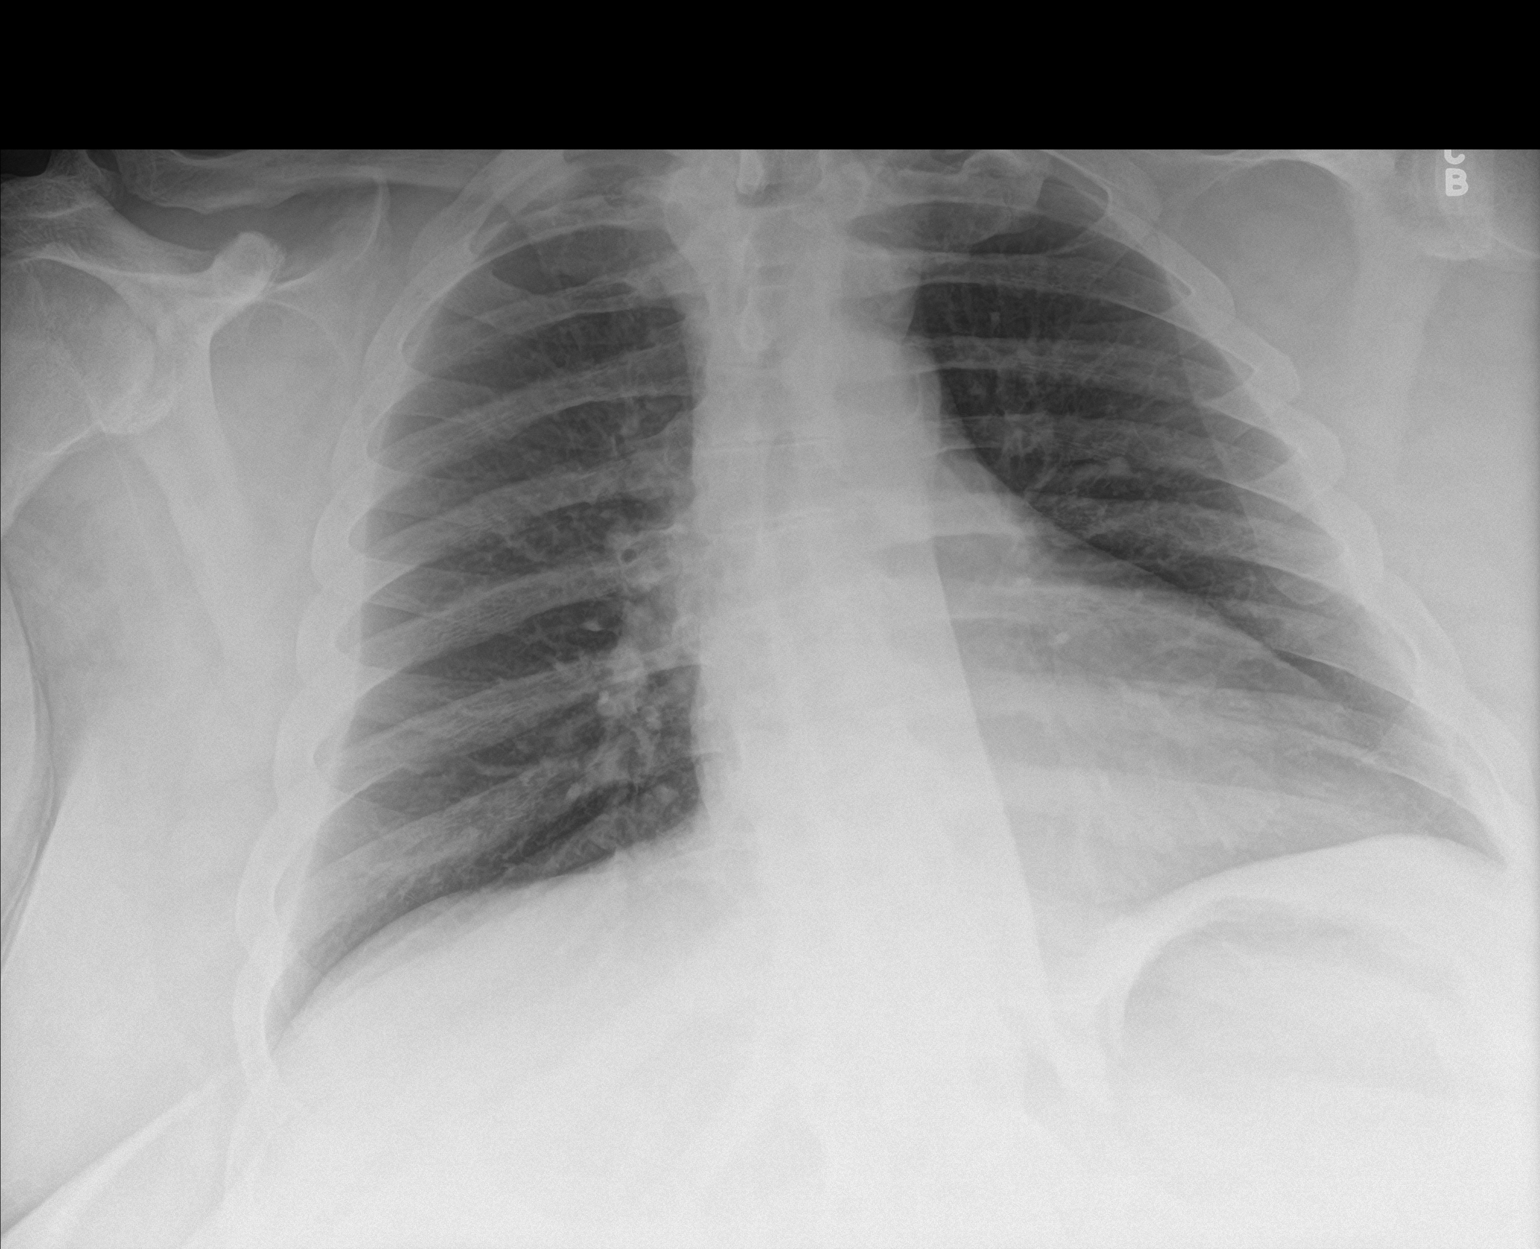

[2 of 2 positions shown; findings below may reference images not displayed]

FINDINGS: The cardiomediastinal contours are normal. The lungs are clear.
Pulmonary vasculature is normal. No consolidation, pleural effusion,
or pneumothorax. There is degenerative change in the spine. Image
quality particularly on the lateral view is limited by soft tissue
attenuation from obese body habitus.
IMPRESSION: No active cardiopulmonary disease.
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
## 2018-06-16 ENCOUNTER — Other Ambulatory Visit: Payer: Self-pay | Admitting: Nurse Practitioner

## 2018-06-27 ENCOUNTER — Telehealth: Payer: Self-pay | Admitting: *Deleted

## 2018-06-27 DIAGNOSIS — L600 Ingrowing nail: Secondary | ICD-10-CM

## 2018-06-27 NOTE — Telephone Encounter (Signed)
Patient called and spoke with Lattie Haw regarding wanting a referral for a Podiatrist for Ingrown toenail. Stated that it was not infected and he could not come in until after the 3rd, that's when he gets his check. Is it ok to place a referral to Podiatrist. Please Advise.

## 2018-06-28 NOTE — Telephone Encounter (Signed)
Referral placed.

## 2018-06-28 NOTE — Telephone Encounter (Signed)
Yes okay to place referral. 

## 2018-07-26 ENCOUNTER — Ambulatory Visit: Payer: Medicare HMO | Admitting: Podiatry

## 2018-07-26 ENCOUNTER — Encounter: Payer: Self-pay | Admitting: Podiatry

## 2018-07-26 DIAGNOSIS — L600 Ingrowing nail: Secondary | ICD-10-CM | POA: Diagnosis not present

## 2018-07-26 DIAGNOSIS — M79676 Pain in unspecified toe(s): Secondary | ICD-10-CM | POA: Diagnosis not present

## 2018-07-26 DIAGNOSIS — B351 Tinea unguium: Secondary | ICD-10-CM | POA: Diagnosis not present

## 2018-07-26 MED ORDER — GENTAMICIN SULFATE 0.1 % EX CREA: 1.0000 "application " | TOPICAL_CREAM | Freq: Two times a day (BID) | CUTANEOUS | 1 refills | Status: DC

## 2018-07-30 NOTE — Progress Notes (Signed)
   Subjective: 58 year old morbidly obese male presents today as a new patient for evaluation of pain to the medial border of the right great toe that began about 4 weeks ago. He reports associated purulent drainage, bleeding and swelling. Patient is concerned for possible ingrown nail. He states he had someone try to cut the nail out about three weeks ago but they ended up cutting too deep causing more of a problem. Touching the toe and wearing shoes increases the pain. Patient presents today for further treatment and evaluation.  Past Medical History:  Diagnosis Date  . Arthritis    "knees, hands" (04/19/2017)  . Childhood asthma   . Diabetes type 2, uncontrolled ([place])   . Gout    "have had it in both feet, both hands, right shoulder, back" (04/19/2017)  . Hyperlipidemia   . Hypertension   . Morbidly obese ([place])   . Pulmonary embolism ([place]) 2017?    Objective:  General: Well developed, nourished, in no acute distress, alert and oriented x3   Dermatology: Skin is warm, dry and supple bilateral. Medial border of the right great toe appears to be erythematous with evidence of an ingrowing nail. Pain on palpation noted to the border of the nail fold. The remaining nails appear unremarkable at this time. There are no open sores, lesions. Nails are tender, long, thickened and dystrophic with subungual debris, consistent with onychomycosis, 1-5 bilateral. No signs of infection noted.  Vascular: Dorsalis Pedis artery and Posterior Tibial artery pedal pulses palpable. No lower extremity edema noted.   Neruologic: Grossly intact via light touch bilateral.  Musculoskeletal: Muscular strength within normal limits in all groups bilateral. Normal range of motion noted to all pedal and ankle joints.   Assesement: #1 Paronychia with ingrowing nail medial border right hallux  #2 Onychodystrophic nails 1-5 bilateral with hyperkeratosis of nails.  #3 Onychomycosis of nail due to dermatophyte  bilateral  Plan of Care:  1. Patient evaluated.  2. Discussed treatment alternatives and plan of care. Explained nail avulsion procedure and post procedure course to patient. 3. Patient opted for temporary partial nail avulsion of the medial border of the right hallux.  4. Prior to procedure, local anesthesia infiltration utilized using 3 ml of a 50:50 mixture of 2% plain lidocaine and 0.5% plain marcaine in a normal hallux block fashion and a betadine prep performed.  5. Light dressing applied. 6. Mechanical debridement of nails 1-5 bilaterally performed using a nail nipper. Filed with dremel without incident.  7. Prescription for Gentamicin cream provided to patient to use daily with a bandage.  8. Return to clinic in 3 months for routine nail care.   Edrick Kins, DPM [facility]  Dr. Edrick Kins, [address]                                        [place], [place] [address]                Office [phone]  Fax [phone]

## 2018-08-25 ENCOUNTER — Telehealth: Payer: Self-pay | Admitting: *Deleted

## 2018-08-25 NOTE — Telephone Encounter (Signed)
He absolutely needs to be seen for proper treatment

## 2018-08-25 NOTE — Telephone Encounter (Signed)
Patient called and stated that he has a lot of Congestion with some wheezing. I offered patient an appointment to be seen today but patient refused stating he DID NOT have any transportation to get here.  Please Advise.   Patient called complaining of cold/upper respiratory/sinus symptoms  1. Fever? Cold and hot flashes 2. Chills? Yes 3. Myalgias? Yes 4. How long have you had your symptoms? 2 weeks 5. Are you coughing up mucus and if yes any discoloration? Yes, White with Greenish tint 6. What have you done for your symptoms thus far? Nebulizer with Albuterol, Hall's Cough drops 7. Have you been around anyone sick? Kids, Niece and [place]:  Copywriter, advertising  I will forward your response to your provider and call you with further instructions

## 2018-08-25 NOTE — Telephone Encounter (Signed)
Patient notified and stated that he will call us back.

## 2018-08-26 ENCOUNTER — Other Ambulatory Visit: Payer: Self-pay

## 2018-08-26 ENCOUNTER — Inpatient Hospital Stay (HOSPITAL_COMMUNITY): Payer: Medicare HMO

## 2018-08-26 ENCOUNTER — Emergency Department (HOSPITAL_COMMUNITY): Payer: Medicare HMO

## 2018-08-26 ENCOUNTER — Encounter (HOSPITAL_COMMUNITY): Payer: Self-pay | Admitting: Emergency Medicine

## 2018-08-26 ENCOUNTER — Inpatient Hospital Stay (HOSPITAL_COMMUNITY)
Admission: EM | Admit: 2018-08-26 | Discharge: 2018-08-29 | DRG: 291 | Disposition: A | Discharge status: Home Health | Payer: Medicare HMO | Attending: Internal Medicine | Admitting: Internal Medicine

## 2018-08-26 DIAGNOSIS — M109 Gout, unspecified: Secondary | ICD-10-CM | POA: Diagnosis present

## 2018-08-26 DIAGNOSIS — R601 Generalized edema: Secondary | ICD-10-CM

## 2018-08-26 DIAGNOSIS — R000 Tachycardia, unspecified: Secondary | ICD-10-CM | POA: Diagnosis not present

## 2018-08-26 DIAGNOSIS — Z79899 Other long term (current) drug therapy: Secondary | ICD-10-CM | POA: Diagnosis not present

## 2018-08-26 DIAGNOSIS — J40 Bronchitis, not specified as acute or chronic: Secondary | ICD-10-CM | POA: Diagnosis not present

## 2018-08-26 DIAGNOSIS — Z791 Long term (current) use of non-steroidal anti-inflammatories (NSAID): Secondary | ICD-10-CM | POA: Diagnosis not present

## 2018-08-26 DIAGNOSIS — I213 ST elevation (STEMI) myocardial infarction of unspecified site: Secondary | ICD-10-CM | POA: Diagnosis not present

## 2018-08-26 DIAGNOSIS — R05 Cough: Secondary | ICD-10-CM | POA: Diagnosis not present

## 2018-08-26 DIAGNOSIS — Z87891 Personal history of nicotine dependence: Secondary | ICD-10-CM | POA: Diagnosis not present

## 2018-08-26 DIAGNOSIS — Z91128 Patient's intentional underdosing of medication regimen for other reason: Secondary | ICD-10-CM | POA: Diagnosis not present

## 2018-08-26 DIAGNOSIS — Z86711 Personal history of pulmonary embolism: Secondary | ICD-10-CM

## 2018-08-26 DIAGNOSIS — E1165 Type 2 diabetes mellitus with hyperglycemia: Secondary | ICD-10-CM | POA: Diagnosis not present

## 2018-08-26 DIAGNOSIS — T502X6A Underdosing of carbonic-anhydrase inhibitors, benzothiadiazides and other diuretics, initial encounter: Secondary | ICD-10-CM | POA: Diagnosis present

## 2018-08-26 DIAGNOSIS — J9601 Acute respiratory failure with hypoxia: Secondary | ICD-10-CM | POA: Diagnosis present

## 2018-08-26 DIAGNOSIS — E119 Type 2 diabetes mellitus without complications: Secondary | ICD-10-CM

## 2018-08-26 DIAGNOSIS — Z8249 Family history of ischemic heart disease and other diseases of the circulatory system: Secondary | ICD-10-CM | POA: Diagnosis not present

## 2018-08-26 DIAGNOSIS — I10 Essential (primary) hypertension: Secondary | ICD-10-CM | POA: Diagnosis not present

## 2018-08-26 DIAGNOSIS — Z800 Family history of malignant neoplasm of digestive organs: Secondary | ICD-10-CM

## 2018-08-26 DIAGNOSIS — Z6841 Body Mass Index (BMI) 40.0 and over, adult: Secondary | ICD-10-CM | POA: Diagnosis not present

## 2018-08-26 DIAGNOSIS — J4541 Moderate persistent asthma with (acute) exacerbation: Secondary | ICD-10-CM

## 2018-08-26 DIAGNOSIS — E785 Hyperlipidemia, unspecified: Secondary | ICD-10-CM | POA: Diagnosis present

## 2018-08-26 DIAGNOSIS — I5031 Acute diastolic (congestive) heart failure: Secondary | ICD-10-CM | POA: Diagnosis not present

## 2018-08-26 DIAGNOSIS — R609 Edema, unspecified: Secondary | ICD-10-CM

## 2018-08-26 DIAGNOSIS — R079 Chest pain, unspecified: Secondary | ICD-10-CM | POA: Diagnosis not present

## 2018-08-26 DIAGNOSIS — I5033 Acute on chronic diastolic (congestive) heart failure: Secondary | ICD-10-CM | POA: Diagnosis present

## 2018-08-26 DIAGNOSIS — Z9111 Patient's noncompliance with dietary regimen: Secondary | ICD-10-CM | POA: Diagnosis not present

## 2018-08-26 DIAGNOSIS — I110 Hypertensive heart disease with heart failure: Secondary | ICD-10-CM | POA: Diagnosis not present

## 2018-08-26 DIAGNOSIS — R600 Localized edema: Secondary | ICD-10-CM

## 2018-08-26 DIAGNOSIS — D649 Anemia, unspecified: Secondary | ICD-10-CM | POA: Diagnosis not present

## 2018-08-26 DIAGNOSIS — M10279 Drug-induced gout, unspecified ankle and foot: Secondary | ICD-10-CM | POA: Diagnosis not present

## 2018-08-26 DIAGNOSIS — Z888 Allergy status to other drugs, medicaments and biological substances status: Secondary | ICD-10-CM | POA: Diagnosis not present

## 2018-08-26 DIAGNOSIS — R4702 Dysphasia: Secondary | ICD-10-CM | POA: Diagnosis not present

## 2018-08-26 DIAGNOSIS — R062 Wheezing: Secondary | ICD-10-CM | POA: Diagnosis not present

## 2018-08-26 DIAGNOSIS — M1020 Drug-induced gout, unspecified site: Secondary | ICD-10-CM | POA: Diagnosis present

## 2018-08-26 DIAGNOSIS — E7849 Other hyperlipidemia: Secondary | ICD-10-CM | POA: Diagnosis not present

## 2018-08-26 LAB — COMPREHENSIVE METABOLIC PANEL
ALK PHOS: 120 U/L (ref 38–126)
ALT: 20 U/L (ref 0–44)
ALT: 23 U/L (ref 0–44)
ANION GAP: 13 (ref 5–15)
AST: 25 U/L (ref 15–41)
AST: 28 U/L (ref 15–41)
Albumin: 3.1 g/dL — ABNORMAL LOW (ref 3.5–5.0)
Albumin: 3.2 g/dL — ABNORMAL LOW (ref 3.5–5.0)
Alkaline Phosphatase: 110 U/L (ref 38–126)
Anion gap: 13 (ref 5–15)
BUN: 17 mg/dL (ref 6–20)
BUN: 18 mg/dL (ref 6–20)
CALCIUM: 8.8 mg/dL — AB (ref 8.9–10.3)
CO2: 22 mmol/L (ref 22–32)
CO2: 24 mmol/L (ref 22–32)
Calcium: 8.4 mg/dL — ABNORMAL LOW (ref 8.9–10.3)
Chloride: 100 mmol/L (ref 98–111)
Chloride: 99 mmol/L (ref 98–111)
Creatinine, Ser: 1.1 mg/dL (ref 0.61–1.24)
Creatinine, Ser: 1.13 mg/dL (ref 0.61–1.24)
GFR calc Af Amer: >60 mL/min (ref >60)
GFR calc Af Amer: >60 mL/min (ref >60)
GFR calc non Af Amer: >60 mL/min (ref >60)
GFR calc non Af Amer: >60 mL/min (ref >60)
GLUCOSE: 144 mg/dL — AB (ref 70–99)
Glucose, Bld: 193 mg/dL — ABNORMAL HIGH (ref 70–99)
Potassium: 3.7 mmol/L (ref 3.5–5.1)
Potassium: 3.7 mmol/L (ref 3.5–5.1)
Sodium: 135 mmol/L (ref 135–145)
Sodium: 136 mmol/L (ref 135–145)
TOTAL PROTEIN: 7.2 g/dL (ref 6.5–8.1)
Total Bilirubin: 0.7 mg/dL (ref 0.3–1.2)
Total Bilirubin: 0.8 mg/dL (ref 0.3–1.2)
Total Protein: 7.7 g/dL (ref 6.5–8.1)

## 2018-08-26 LAB — CBC WITH DIFFERENTIAL/PLATELET
Abs Immature Granulocytes: 0.03 10*3/uL (ref 0.00–0.07)
Abs Immature Granulocytes: 0.05 10*3/uL (ref 0.00–0.07)
BASOS ABS: 0 10*3/uL (ref 0.0–0.1)
BASOS ABS: 0 10*3/uL (ref 0.0–0.1)
Basophils Relative: 0 %
Basophils Relative: 0 %
EOS PCT: 7 %
Eosinophils Absolute: 0.4 10*3/uL (ref 0.0–0.5)
Eosinophils Absolute: 0 10*3/uL (ref 0.0–0.5)
Eosinophils Relative: 0 %
HCT: 39.8 % (ref 39.0–52.0)
HCT: 42.3 % (ref 39.0–52.0)
Hemoglobin: 12.4 g/dL — ABNORMAL LOW (ref 13.0–17.0)
Hemoglobin: 13.5 g/dL (ref 13.0–17.0)
IMMATURE GRANULOCYTES: 1 %
Immature Granulocytes: 1 %
Lymphocytes Relative: 25 %
Lymphocytes Relative: 11 %
Lymphs Abs: 1.6 10*3/uL (ref 0.7–4.0)
Lymphs Abs: 0.7 10*3/uL (ref 0.7–4.0)
MCH: 30.2 pg (ref 26.0–34.0)
MCH: 30.3 pg (ref 26.0–34.0)
MCHC: 31.2 g/dL (ref 30.0–36.0)
MCHC: 31.9 g/dL (ref 30.0–36.0)
MCV: 97.1 fL (ref 80.0–100.0)
MCV: 94.8 fL (ref 80.0–100.0)
Monocytes Absolute: 0.7 10*3/uL (ref 0.1–1.0)
Monocytes Absolute: 0.2 10*3/uL (ref 0.1–1.0)
Monocytes Relative: 11 %
Monocytes Relative: 3 %
Neutro Abs: 3.5 10*3/uL (ref 1.7–7.7)
Neutro Abs: 5.2 10*3/uL (ref 1.7–7.7)
Neutrophils Relative %: 56 %
Neutrophils Relative %: 85 %
Platelets: 174 10*3/uL (ref 150–400)
Platelets: 168 10*3/uL (ref 150–400)
RBC: 4.1 MIL/uL — ABNORMAL LOW (ref 4.22–5.81)
RBC: 4.46 MIL/uL (ref 4.22–5.81)
RDW: 12.8 % (ref 11.5–15.5)
RDW: 12.8 % (ref 11.5–15.5)
WBC: 6.2 10*3/uL (ref 4.0–10.5)
WBC: 6.1 10*3/uL (ref 4.0–10.5)
nRBC: 0 % (ref 0.0–0.2)
nRBC: 0 % (ref 0.0–0.2)

## 2018-08-26 LAB — GLUCOSE, CAPILLARY
Glucose-Capillary: 216 mg/dL — ABNORMAL HIGH (ref 70–99)
Glucose-Capillary: 189 mg/dL — ABNORMAL HIGH (ref 70–99)
Glucose-Capillary: 185 mg/dL — ABNORMAL HIGH (ref 70–99)
Glucose-Capillary: 197 mg/dL — ABNORMAL HIGH (ref 70–99)

## 2018-08-26 LAB — ECHOCARDIOGRAM COMPLETE
Height: 66 in
Weight: 7708.8 oz

## 2018-08-26 LAB — HEMOGLOBIN A1C
HEMOGLOBIN A1C: 6.1 % — AB (ref 4.8–5.6)
Mean Plasma Glucose: 128.37 mg/dL

## 2018-08-26 LAB — TROPONIN I: Troponin I: <0.03 ng/mL (ref <0.03)

## 2018-08-26 LAB — BRAIN NATRIURETIC PEPTIDE: B Natriuretic Peptide: 21.9 pg/mL (ref 0.0–100.0)

## 2018-08-26 LAB — TSH: TSH: 2.585 u[IU]/mL (ref 0.350–4.500)

## 2018-08-26 LAB — MAGNESIUM: Magnesium: 2.1 mg/dL (ref 1.7–2.4)

## 2018-08-26 MED ORDER — LEVALBUTEROL HCL 0.63 MG/3ML IN NEBU
0.6300 mg | INHALATION_SOLUTION | Freq: Four times a day (QID) | RESPIRATORY_TRACT | Status: DC
  Administered 2018-08-26 – 2018-08-29 (×14): 0.63 mg via RESPIRATORY_TRACT
  Filled 2018-08-26 (×14): qty 3

## 2018-08-26 MED ORDER — LISINOPRIL 5 MG PO TABS
2.5000 mg | ORAL_TABLET | Freq: Every day | ORAL | Status: DC
  Administered 2018-08-26 – 2018-08-29 (×4): 2.5 mg via ORAL
  Filled 2018-08-26 (×4): qty 1

## 2018-08-26 MED ORDER — ENOXAPARIN SODIUM 40 MG/0.4ML ~~LOC~~ SOLN: 40.0000 mg | SUBCUTANEOUS | Status: DC

## 2018-08-26 MED ORDER — IPRATROPIUM-ALBUTEROL 0.5-2.5 (3) MG/3ML IN SOLN
3.0000 mL | Freq: Once | RESPIRATORY_TRACT | Status: AC
  Administered 2018-08-26: 3 mL via RESPIRATORY_TRACT
  Filled 2018-08-26: qty 3

## 2018-08-26 MED ORDER — MAGNESIUM SULFATE 2 GM/50ML IV SOLN
2.0000 g | Freq: Once | INTRAVENOUS | Status: AC
  Administered 2018-08-26: 2 g via INTRAVENOUS
  Filled 2018-08-26: qty 50

## 2018-08-26 MED ORDER — ONDANSETRON HCL 4 MG PO TABS: 4.0000 mg | ORAL_TABLET | Freq: Four times a day (QID) | ORAL | Status: DC | PRN

## 2018-08-26 MED ORDER — ATORVASTATIN CALCIUM 10 MG PO TABS
10.0000 mg | ORAL_TABLET | Freq: Every day | ORAL | Status: DC
  Administered 2018-08-26 – 2018-08-29 (×4): 10 mg via ORAL
  Filled 2018-08-26 (×4): qty 1

## 2018-08-26 MED ORDER — METHYLPREDNISOLONE SODIUM SUCC 125 MG IJ SOLR
125.0000 mg | Freq: Once | INTRAMUSCULAR | Status: AC
  Administered 2018-08-26: 125 mg via INTRAVENOUS
  Filled 2018-08-26: qty 2

## 2018-08-26 MED ORDER — ACETAMINOPHEN 650 MG RE SUPP: 650.0000 mg | Freq: Four times a day (QID) | RECTAL | Status: DC | PRN

## 2018-08-26 MED ORDER — ACETAMINOPHEN 325 MG PO TABS: 650.0000 mg | ORAL_TABLET | Freq: Four times a day (QID) | ORAL | Status: DC | PRN

## 2018-08-26 MED ORDER — LEVALBUTEROL HCL 0.63 MG/3ML IN NEBU: 0.6300 mg | INHALATION_SOLUTION | Freq: Four times a day (QID) | RESPIRATORY_TRACT | Status: DC | PRN

## 2018-08-26 MED ORDER — ALLOPURINOL 300 MG PO TABS
300.0000 mg | ORAL_TABLET | Freq: Two times a day (BID) | ORAL | Status: DC
  Administered 2018-08-26 – 2018-08-29 (×7): 300 mg via ORAL
  Filled 2018-08-26 (×7): qty 1

## 2018-08-26 MED ORDER — ONDANSETRON HCL 4 MG/2ML IJ SOLN: 4.0000 mg | Freq: Four times a day (QID) | INTRAMUSCULAR | Status: DC | PRN

## 2018-08-26 MED ORDER — FUROSEMIDE 10 MG/ML IJ SOLN
40.0000 mg | Freq: Once | INTRAMUSCULAR | Status: AC
  Administered 2018-08-26: 40 mg via INTRAVENOUS
  Filled 2018-08-26: qty 4

## 2018-08-26 MED ORDER — INSULIN ASPART 100 UNIT/ML ~~LOC~~ SOLN
0.0000 [IU] | Freq: Three times a day (TID) | SUBCUTANEOUS | Status: DC
  Administered 2018-08-26: 3 [IU] via SUBCUTANEOUS
  Administered 2018-08-26 (×2): 2 [IU] via SUBCUTANEOUS
  Administered 2018-08-27 (×2): 1 [IU] via SUBCUTANEOUS
  Administered 2018-08-27 – 2018-08-28 (×2): 2 [IU] via SUBCUTANEOUS
  Administered 2018-08-28 – 2018-08-29 (×3): 3 [IU] via SUBCUTANEOUS
  Administered 2018-08-29: 2 [IU] via SUBCUTANEOUS
  Administered 2018-08-29: 3 [IU] via SUBCUTANEOUS

## 2018-08-26 MED ORDER — ENOXAPARIN SODIUM 100 MG/ML ~~LOC~~ SOLN
100.0000 mg | SUBCUTANEOUS | Status: DC
  Administered 2018-08-26 – 2018-08-28 (×3): 100 mg via SUBCUTANEOUS
  Filled 2018-08-26 (×4): qty 1

## 2018-08-26 MED ORDER — FUROSEMIDE 10 MG/ML IJ SOLN
40.0000 mg | Freq: Two times a day (BID) | INTRAMUSCULAR | Status: DC
  Administered 2018-08-26 – 2018-08-29 (×7): 40 mg via INTRAVENOUS
  Filled 2018-08-26 (×7): qty 4

## 2018-08-26 NOTE — ED Notes (Signed)
Patient transported to X-ray 

## 2018-08-26 NOTE — ED Notes (Signed)
ED TO INPATIENT HANDOFF REPORT  ED Nurse Name and Phone #:  Clydene Laming  [address] Name/Age/Gender Ronnie Hernandez 58 y.o. male Room/Bed: TRACC/TRACC  Code Status Full Code  Home/SNF/Other HOME {Patient oriented to PERSON/PLACE/ TIME/SITUATION  Is this baseline? YES  Triage Complete: Triage complete  Chief Complaint Sob  Triage Note Patient arrived with EMS from home reports worsening chest congestion with productive cough/wheezing for 2 weeks unrelieved by inhaler, he received 2 doses of Duoneb by EMS prior to arrival with mild relief. Denies fever or chills . Pt. added bilateral lower legs edema this week .    Allergies Allergies  Allergen Reactions  . Uloric [Febuxostat] Other (See Comments)    Made the skin on feet and legs "hurt"    Level of Care/Admitting Diagnosis ED Disposition    ED Disposition Condition [place]: [place] [[id number]]  Level of Care: Medical Telemetry [104]  Diagnosis: Acute respiratory failure with hypoxia [facility]) [[id number]]  Admitting Physician: Rise Patience [phone]  Attending Physician: Rise Patience [phone]  Estimated length of stay: past midnight tomorrow  Certification:: I certify this patient will need inpatient services for at least 2 midnights  PT Class (Do Not Modify): Inpatient [101]  PT Acc Code (Do Not Modify): Private [1]       B Medical/Surgery History Past Medical History:  Diagnosis Date  . Arthritis    "knees, hands" (04/19/2017)  . Childhood asthma   . Diabetes type 2, uncontrolled ([place])   . Gout    "have had it in both feet, both hands, right shoulder, back" (04/19/2017)  . Hyperlipidemia   . Hypertension   . Morbidly obese ([place])   . Pulmonary embolism ([place]) 2017?   Past Surgical History:  Procedure Laterality Date  . COLONOSCOPY W/ BIOPSIES AND POLYPECTOMY  07/2011   Archie Endo 07/27/2011     A IV Location/Drains/Wounds Patient Lines/Drains/Airways Status    Active Line/Drains/Airways    Name:   Placement date:   Placement time:   Site:   Days:   Peripheral IV 08/26/18 Left Antecubital   08/26/18    0437    Antecubital   less than 1   Wound / Incision (Open or Dehisced) 01/30/16 Left left leg wound    01/30/16    -    -   939          Intake/Output Last 24 hours  Intake/Output Summary (Last 24 hours) at 08/26/2018 0522 Last data filed at 08/26/2018 0438 Gross per 24 hour  Intake -  Output 3800 ml  Net -3800 ml    Labs/Imaging Results for orders placed or performed during the hospital encounter of 08/26/18 (from the past 48 hour(s))  CBC with Differential     Status: Abnormal   Collection Time: 08/26/18  1:10 AM  Result Value Ref Range   WBC 6.2 4.0 - 10.5 K/uL   RBC 4.10 (L) 4.22 - 5.81 MIL/uL   Hemoglobin 12.4 (L) 13.0 - 17.0 g/dL   HCT 39.8 39.0 - 52.0 %   MCV 97.1 80.0 - 100.0 fL   MCH 30.2 26.0 - 34.0 pg   MCHC 31.2 30.0 - 36.0 g/dL   RDW 12.8 11.5 - 15.5 %   Platelets 174 150 - 400 K/uL   nRBC 0.0 0.0 - 0.2 %   Neutrophils Relative % 56 %   Neutro Abs 3.5 1.7 - 7.7 K/uL   Lymphocytes Relative 25 %  Lymphs Abs 1.6 0.7 - 4.0 K/uL   Monocytes Relative 11 %   Monocytes Absolute 0.7 0.1 - 1.0 K/uL   Eosinophils Relative 7 %   Eosinophils Absolute 0.4 0.0 - 0.5 K/uL   Basophils Relative 0 %   Basophils Absolute 0.0 0.0 - 0.1 K/uL   Immature Granulocytes 1 %   Abs Immature Granulocytes 0.03 0.00 - 0.07 K/uL    Comment: Performed at [facility], [place] [address] [place], [place] [address]  Comprehensive metabolic panel     Status: Abnormal   Collection Time: 08/26/18  1:10 AM  Result Value Ref Range   Sodium 135 135 - 145 mmol/L   Potassium 3.7 3.5 - 5.1 mmol/L   Chloride 100 98 - 111 mmol/L   CO2 22 22 - 32 mmol/L   Glucose, Bld 144 (H) 70 - 99 mg/dL   BUN 17 6 - 20 mg/dL   Creatinine, Ser 1.10 0.61 - 1.24 mg/dL   Calcium 8.4 (L) 8.9 - 10.3 mg/dL   Total Protein 7.2 6.5 - 8.1 g/dL   Albumin 3.1 (L) 3.5 - 5.0  g/dL   AST 25 15 - 41 U/L   ALT 20 0 - 44 U/L   Alkaline Phosphatase 110 38 - 126 U/L   Total Bilirubin 0.7 0.3 - 1.2 mg/dL   GFR calc non Af Amer >60 >60 mL/min   GFR calc Af Amer >60 >60 mL/min   Anion gap 13 5 - 15    Comment: Performed at [facility] [address] [place], [place] [address]  Brain natriuretic peptide     Status: None   Collection Time: 08/26/18  1:10 AM  Result Value Ref Range   B Natriuretic Peptide 21.9 0.0 - 100.0 pg/mL    Comment: Performed at [facility] [address] [place], [place] [address]  Troponin I - Add-On to previous collection     Status: None   Collection Time: 08/26/18  1:10 AM  Result Value Ref Range   Troponin I <0.03 <0.03 ng/mL    Comment: Performed at [facility] [address] [place],  [address]   Dg Chest 2 View  Result Date: 08/26/2018 CLINICAL DATA:  Wheezing, cough, chest congestion. Progressive symptoms over 2 weeks. EXAM: CHEST - 2 VIEW COMPARISON:  11/15/2017 FINDINGS: Cardiomegaly is unchanged from prior exam. Mild peribronchial thickening. No focal airspace disease, pleural effusion, or pneumothorax. Degenerative change in the spine. Soft tissue attenuation from body habitus limits detailed assessment. IMPRESSION: Chronic cardiomegaly. Mild peribronchial thickening. Electronically Signed   By: Keith Rake M.D.   On: 08/26/2018 01:41    Pending Labs Unresulted Labs (From admission, onward)   None      Vitals/Pain Today's Vitals   08/26/18 0300 08/26/18 0329 08/26/18 0448 08/26/18 0522  BP: (!) 117/59   (!) 108/55  Pulse: (!) 105   (!) 106  Resp: (!) 22   18  Temp:      TempSrc:      SpO2: 97%   99%  Weight:      Height:      PainSc:  Asleep 0-No pain 0-No pain    Isolation Precautions No active isolations  Medications Medications  ipratropium-albuterol (DUONEB) 0.5-2.5 (3) MG/3ML nebulizer solution 3 mL (3 mLs Nebulization Given 08/26/18 0152)  methylPREDNISolone sodium  succinate (SOLU-MEDROL) 125 mg/2 mL injection 125 mg (125 mg Intravenous Given 08/26/18 0154)  furosemide (LASIX) injection 40 mg (40  mg Intravenous Given 08/26/18 0155)  ipratropium-albuterol (DUONEB) 0.5-2.5 (3) MG/3ML nebulizer solution 3 mL (3 mLs Nebulization Given 08/26/18 0435)  magnesium sulfate IVPB 2 g 50 mL (0 g Intravenous Stopped 08/26/18 0521)    Mobility walks with device Low fall risk   Focused Assessments RESPIRATIONS IMPROVED AFTER NEBULIZER TREATMENTS ,  MAGNESIUM/SOMEDROL IV , DENIES CHEST PAIN , IV SITE INTACT.  DIURESED 1,800  ML URINE.   R Recommendations: See Admitting Provider Note  Report given to:   Additional Notes:  NO FAMILY PRESENT

## 2018-08-26 NOTE — Progress Notes (Signed)
I have seen and assessed patient and agree with Dr. Moise Boring assessment and plan.  Patient is a 58 year old gentleman history of diastolic dysfunction last 2D echo February 2018 EF 60 to 65% with grade 3 diastolic dysfunction, hypertension, morbid obesity, gout, chronic anemia resented to the ED with worsening shortness of breath and increasing lower extremity edema as well as wheezing.  Patient also noted to have some chest pain pressure-like sensation.  Chest x-ray done showed cardiomegaly with normal BNP.  Patient looks volume overloaded on examination.  Patient placed on IV Lasix as well as nebulizer treatments due to concern for worsening CHF and possible bronchitis.  Will order 2D echo.  Strict I's and O's.  Daily weights.  If 2D echo is abnormal or changed will need consultation with cardiology.  Patient will need outpatient follow-up with cardiology.  No charge.

## 2018-08-26 NOTE — ED Provider Notes (Signed)
[facility] EMERGENCY DEPARTMENT Provider Note   CSN: [id number] Arrival date & time: 08/26/18  0056    History   Chief Complaint Chief Complaint  Patient presents with  . Wheezing    Cough    HPI Ronnie Hernandez is a 58 y.o. male.     Patient is a 58 year old male with past medical history significant for pulmonary embolism not on anticoagulant, hypertension, gout, asthma, obesity presenting with a two-week history of progressively worsening "congestion in the chest" with leg swelling, shortness of breath and dry cough.  He has been using his inhaler at home but states it was expired.  He is given duo nebs by EMS with mild relief.  Cough is relatively clear mucus.  Occasionally has chest tightness that last for a few seconds to a minute at a time.  States his legs are more swollen than usual and he thinks he is gained weight as well.  States compliance with his Lasix at home.  Came in tonight because he felt like his breathing was not getting any better and is getting more swollen.  Denies any pain to his legs.  No swelling to his scrotum.  History of pulmonary embolism no longer on anticoagulation.  The history is provided by the patient and the EMS personnel.  Wheezing  Associated symptoms: chest pain, cough, rhinorrhea and shortness of breath   Associated symptoms: no fever, no headaches and no rash     Past Medical History:  Diagnosis Date  . Arthritis    "knees, hands" (04/19/2017)  . Childhood asthma   . Diabetes type 2, uncontrolled ([place])   . Gout    "have had it in both feet, both hands, right shoulder, back" (04/19/2017)  . Hyperlipidemia   . Hypertension   . Morbidly obese ([place])   . Pulmonary embolism ([place]) 2017?    Patient Active Problem List   Diagnosis Date Noted  . Left knee pain 03/31/2018  . Knee pain   . Acute gout 04/18/2017  . Pulmonary embolus ([place]) 08/05/2016  . Hyperglycemia, drug-induced 07/08/2016  . Impaired glucose  tolerance 07/08/2016  . Leukocytosis 07/06/2016  . Unable to walk 07/06/2016  . Acute gouty arthritis 07/06/2016  . Abnormality of gait 02/18/2016  . Cellulitis of left lower extremity 01/27/2016  . Type 2 diabetes mellitus ([place]) 12/25/2013  . Drug-induced gout 12/25/2013  . Hyperlipidemia 08/01/2013  . Morbid obesity ([place])   . Hypertension   . Diabetes type 2, uncontrolled ([place])   . Gout   . Colon cancer screening 07/27/2011  . Benign neoplasm of colon 07/27/2011    Past Surgical History:  Procedure Laterality Date  . COLONOSCOPY W/ BIOPSIES AND POLYPECTOMY  07/2011   Archie Endo 07/27/2011        Home Medications    Prior to Admission medications   Medication Sig Start Date End Date Taking? Authorizing Provider  acetaminophen (TYLENOL) 650 MG CR tablet Take 650 mg by mouth every 8 (eight) hours as needed for pain.    [provider]  allopurinol (ZYLOPRIM) 300 MG tablet Take 1 tablet (300 mg total) by mouth 2 (two) times daily. 06/16/18   [name], NP  atorvastatin (LIPITOR) 10 MG tablet Take 1 tablet (10 mg total) by mouth daily at 6 PM. 11/24/17   [name], NP  colchicine 0.6 MG tablet Take 1 tablet (0.6 mg total) by mouth daily. 04/04/18   Shelly Coss, MD  diclofenac sodium (VOLTAREN) 1 % GEL  Apply 2 g topically 4 (four) times daily. 09/08/17   Duffy Bruce, MD  furosemide (LASIX) 20 MG tablet TAKE 2 TABLETS BY MOUTH EVERY DAY 02/17/18   Reed, Tiffany L, DO  gentamicin cream (GARAMYCIN) 0.1 % Apply 1 application topically 2 (two) times daily. 07/26/18   Edrick Kins, DPM  lisinopril (PRINIVIL,ZESTRIL) 2.5 MG tablet TAKE 1 TABLET BY MOUTH ONCE DAILY 02/17/18   [name], NP  Menthol, Topical Analgesic, (BIOFREEZE) 4 % GEL Apply 1 application topically 4 (four) times daily as needed (pain).    [provider]  nystatin-triamcinolone ointment (MYCOLOG) Apply 1 application topically 2 (two) times daily. 11/29/16   [name], NP   predniSONE (DELTASONE) 10 MG tablet Take 60 mg daily for 2 days then 40 mg daily for 3 days then 20 mg daily for 3 days then 10 mg daily for 3 days then stop 04/05/18   Shelly Coss, MD  trolamine salicylate (ASPERCREME) 10 % cream Apply 1 application topically 2 (two) times daily as needed for muscle pain.    [provider]    Family History Family History  Problem Relation Age of Onset  . Colon cancer Maternal Grandfather 69  . Heart attack Father   . Stomach cancer Maternal Aunt 80    Social History Social History   Tobacco Use  . Smoking status: Former Smoker    Last attempt to quit: 02/09/1982    Years since quitting: 36.5  . Smokeless tobacco: Never Used  Substance Use Topics  . Alcohol use: No  . Drug use: Yes    Comment: 04/19/2017 "nothing since I was 21"     Allergies   Uloric [febuxostat]   Review of Systems Review of Systems  Constitutional: Positive for activity change and appetite change. Negative for fever.  HENT: Positive for congestion and rhinorrhea.   Respiratory: Positive for cough, shortness of breath and wheezing.   Cardiovascular: Positive for chest pain and leg swelling.  Gastrointestinal: Negative for abdominal pain, nausea and vomiting.  Genitourinary: Negative for decreased urine volume, dysuria, hematuria, scrotal swelling and urgency.  Musculoskeletal: Negative for arthralgias and myalgias.  Skin: Negative for rash.  Neurological: Negative for dizziness and headaches.   all other systems are negative except as noted in the HPI and PMH.     Physical Exam Updated Vital Signs BP 117/65 (BP Location: Right Wrist)   Pulse (!) 110   Temp 98.2 F (36.8 C) (Temporal)   Resp (!) 22   Ht 5\' 8"  (1.727 m)   Wt (!) 215.5 kg   SpO2 100%   BMI 72.22 kg/m   Physical Exam Vitals signs and nursing note reviewed.  Constitutional:      General: He is not in acute distress.    Appearance: He is well-developed. He is obese.      Comments: Morbidly obese, mild respiratory distress  HENT:     Head: Normocephalic and atraumatic.     Mouth/Throat:     Pharynx: No oropharyngeal exudate.  Eyes:     Conjunctiva/sclera: Conjunctivae normal.     Pupils: Pupils are equal, round, and reactive to light.  Neck:     Musculoskeletal: Normal range of motion and neck supple.     Comments: No meningismus. Cardiovascular:     Rate and Rhythm: Regular rhythm. Tachycardia present.     Heart sounds: Normal heart sounds. No murmur.  Pulmonary:     Effort: Pulmonary effort is normal. No respiratory distress.  Breath sounds: Wheezing and rhonchi present.     Comments: Harsh rhonchi throughout with scattered expiratory wheezing Abdominal:     Palpations: Abdomen is soft.     Tenderness: There is no abdominal tenderness. There is no guarding or rebound.  Musculoskeletal: Normal range of motion.        General: No tenderness.     Right lower leg: Edema present.     Left lower leg: Edema present.     Comments: +3 edema to legs bilaterally  Skin:    General: Skin is warm.     Capillary Refill: Capillary refill takes less than 2 seconds.  Neurological:     General: No focal deficit present.     Mental Status: He is alert and oriented to person, place, and time. Mental status is at baseline.     Cranial Nerves: No cranial nerve deficit.     Motor: No abnormal muscle tone.     Coordination: Coordination normal.     Comments: No ataxia on finger to nose bilaterally. No pronator drift. 5/5 strength throughout. CN 2-12 intact.Equal grip strength. Sensation intact.   Psychiatric:        Behavior: Behavior normal.      ED Treatments / Results  Labs (all labs ordered are listed, but only abnormal results are displayed) Labs Reviewed  CBC WITH DIFFERENTIAL/PLATELET - Abnormal; Notable for the following components:      Result Value   RBC 4.10 (*)    Hemoglobin 12.4 (*)    All other components within normal limits    COMPREHENSIVE METABOLIC PANEL - Abnormal; Notable for the following components:   Glucose, Bld 144 (*)    Calcium 8.4 (*)    Albumin 3.1 (*)    All other components within normal limits  GLUCOSE, CAPILLARY - Abnormal; Notable for the following components:   Glucose-Capillary 216 (*)    All other components within normal limits  RESPIRATORY PANEL BY PCR  BRAIN NATRIURETIC PEPTIDE  TROPONIN I  COMPREHENSIVE METABOLIC PANEL  CBC WITH DIFFERENTIAL/PLATELET  TSH  MAGNESIUM  HEMOGLOBIN A1C    EKG EKG Interpretation  Date/Time:  Saturday August 26 2018 00:56:18 EST Ventricular Rate:  109 PR Interval:    QRS Duration: 110 QT Interval:  342 QTC Calculation: 461 R Axis:   -26 Text Interpretation:  Sinus tachycardia Borderline left axis deviation Probable anteroseptal infarct, old No significant change was found Confirmed by Ezequiel Essex [phone]) on 08/26/2018 1:27:07 AM   Radiology Dg Chest 2 View  Result Date: 08/26/2018 CLINICAL DATA:  Wheezing, cough, chest congestion. Progressive symptoms over 2 weeks. EXAM: CHEST - 2 VIEW COMPARISON:  11/15/2017 FINDINGS: Cardiomegaly is unchanged from prior exam. Mild peribronchial thickening. No focal airspace disease, pleural effusion, or pneumothorax. Degenerative change in the spine. Soft tissue attenuation from body habitus limits detailed assessment. IMPRESSION: Chronic cardiomegaly. Mild peribronchial thickening. Electronically Signed   By: Keith Rake M.D.   On: 08/26/2018 01:41    Procedures Procedures (including critical care time)  Medications Ordered in ED Medications  ipratropium-albuterol (DUONEB) 0.5-2.5 (3) MG/3ML nebulizer solution 3 mL (has no administration in time range)  methylPREDNISolone sodium succinate (SOLU-MEDROL) 125 mg/2 mL injection 125 mg (has no administration in time range)  furosemide (LASIX) injection 40 mg (has no administration in time range)     Initial Impression / Assessment and Plan / ED  Course  I have reviewed the triage vital signs and the nursing notes.  Pertinent labs & imaging results that  were available during my care of the patient were reviewed by me and considered in my medical decision making (see chart for details).       Patient with increased anasarca as well as shortness of breath over the past several weeks.  Intermittent episodes of chest pain.  Suspect combination CHF and asthma exacerbation  Patient given nebulizers as well as steroids, IV Lasix and magnesium.  His chest x-ray is negative for infiltrate. No edema seen on x-ray but this was limited by body habitus.  Patient appears diffusely edematous throughout his arms and legs as well as abdomen.  He still complains of being short of breath and is persistently tachypneic.  He remains wheezing.  We will continue to treat suspected asthma exacerbation as well as CHF exacerbation.  Lower suspicion for PE as he is wheezing and not hypoxic.  Admission for continued breathing treatments as well as diuresis discussed with Dr. Hal Hope. Final Clinical Impressions(s) / ED Diagnoses   Final diagnoses:  Moderate persistent asthma with exacerbation  Anasarca    ED Discharge Orders    None       Joesph Marcy, Annie Main, MD 08/26/18 [phone]

## 2018-08-26 NOTE — H&P (Signed)
History and Physical    Ronnie Hernandez:[id number] DOB: 1961-03-29 DOA: 08/26/2018  PCP: [name], NP  Patient coming from: Home.  Chief Complaint: Shortness of breath.  HPI: Ronnie Hernandez is a 59 y.o. male with history of diastolic dysfunction last 2D echo done in February 2018 showed EF of 60 to 65% with grade 3 diastolic dysfunction, hypertension, gout, chronic anemia and morbid obesity presents to the ER because of worsening shortness of breath.  Patient states over last few days patient has been getting increasingly short of breath with increasing lower extremity edema and has been wheezing.  At times has chest pain pressure-like.  Because of worsening shortness of breath patient presented to the ER.  Patient states he has been compliant with his Lasix.  ED Course: In the ER patient is found to be hypoxic and wheezing chest x-ray shows cardiomegaly BNP was normal.  On exam patient looks edematous.  Was given Lasix 40 mg IV and nebulizer treatment admitted for acute respiratory failure with hypoxia likely from worsening CHF but could be also contributed by bronchitis since patient is wheezing.  EKG shows sinus tachycardia.  Hemoglobin 12.4 WBC count 6.2.  Review of Systems: As per HPI, rest all negative.   Past Medical History:  Diagnosis Date  . Arthritis    "knees, hands" (04/19/2017)  . Childhood asthma   . Diabetes type 2, uncontrolled ([place])   . Gout    "have had it in both feet, both hands, right shoulder, back" (04/19/2017)  . Hyperlipidemia   . Hypertension   . Morbidly obese ([place])   . Pulmonary embolism ([place]) 2017?    Past Surgical History:  Procedure Laterality Date  . COLONOSCOPY W/ BIOPSIES AND POLYPECTOMY  07/2011   Archie Endo 07/27/2011     reports that he quit smoking about 36 years ago. He has never used smokeless tobacco. He reports current drug use. He reports that he does not drink alcohol.  Allergies  Allergen Reactions  . Uloric  [Febuxostat] Other (See Comments)    Made the skin on feet and legs "hurt"    Family History  Problem Relation Age of Onset  . Colon cancer Maternal Grandfather 9  . Heart attack Father   . Stomach cancer Maternal Aunt 80    Prior to Admission medications   Medication Sig Start Date End Date Taking? Authorizing Provider  acetaminophen (TYLENOL) 650 MG CR tablet Take 650 mg by mouth every 8 (eight) hours as needed for pain.    [provider]  allopurinol (ZYLOPRIM) 300 MG tablet Take 1 tablet (300 mg total) by mouth 2 (two) times daily. 06/16/18   [name], NP  atorvastatin (LIPITOR) 10 MG tablet Take 1 tablet (10 mg total) by mouth daily at 6 PM. 11/24/17   [name], NP  colchicine 0.6 MG tablet Take 1 tablet (0.6 mg total) by mouth daily. 04/04/18   Shelly Coss, MD  diclofenac sodium (VOLTAREN) 1 % GEL Apply 2 g topically 4 (four) times daily. 09/08/17   Duffy Bruce, MD  furosemide (LASIX) 20 MG tablet TAKE 2 TABLETS BY MOUTH EVERY DAY 02/17/18   Reed, Tiffany L, DO  gentamicin cream (GARAMYCIN) 0.1 % Apply 1 application topically 2 (two) times daily. 07/26/18   Edrick Kins, DPM  lisinopril (PRINIVIL,ZESTRIL) 2.5 MG tablet TAKE 1 TABLET BY MOUTH ONCE DAILY 02/17/18   [name], NP  Menthol, Topical Analgesic, (BIOFREEZE) 4 % GEL Apply 1 application  topically 4 (four) times daily as needed (pain).    [provider]  nystatin-triamcinolone ointment (MYCOLOG) Apply 1 application topically 2 (two) times daily. 11/29/16   [name], NP  predniSONE (DELTASONE) 10 MG tablet Take 60 mg daily for 2 days then 40 mg daily for 3 days then 20 mg daily for 3 days then 10 mg daily for 3 days then stop 04/05/18   Shelly Coss, MD  trolamine salicylate (ASPERCREME) 10 % cream Apply 1 application topically 2 (two) times daily as needed for muscle pain.    [provider]    Physical Exam: Vitals:   08/26/18 0300 08/26/18 0522  08/26/18 0530 08/26/18 0600  BP: (!) 117/59 (!) 108/55 129/81 (!) 124/54  Pulse: (!) 105 (!) 106 (!) 115 (!) 116  Resp: (!) 22 18 (!) 22 16  Temp:    98 F (36.7 C)  TempSrc:    Oral  SpO2: 97% 99% 97% 96%  Weight:    (!) 218.5 kg  Height:    5\' 6"  (1.676 m)      Constitutional: Moderately built and nourished. Vitals:   08/26/18 0300 08/26/18 0522 08/26/18 0530 08/26/18 0600  BP: (!) 117/59 (!) 108/55 129/81 (!) 124/54  Pulse: (!) 105 (!) 106 (!) 115 (!) 116  Resp: (!) 22 18 (!) 22 16  Temp:    98 F (36.7 C)  TempSrc:    Oral  SpO2: 97% 99% 97% 96%  Weight:    (!) 218.5 kg  Height:    5\' 6"  (1.676 m)   Eyes: Anicteric no pallor. ENMT: No discharge from the ears eyes nose or mouth. Neck: JVD not appreciated no mass felt. Respiratory: Bilateral expiratory wheeze and no crepitations. Cardiovascular: S1-S2 heard. Abdomen: Soft nontender bowel sounds present. Musculoskeletal: Bilateral lower extremity edema present. Skin: Chronic skin changes. Neurologic: Alert awake oriented to time place and person.  Moves all extremities. Psychiatric: Appears normal.  Normal affect.   Labs on Admission: I have personally reviewed following labs and imaging studies  CBC: Recent Labs  Lab 08/26/18 0110  WBC 6.2  NEUTROABS 3.5  HGB 12.4*  HCT 39.8  MCV 97.1  PLT 355   Basic Metabolic Panel: Recent Labs  Lab 08/26/18 0110  NA 135  K 3.7  CL 100  CO2 22  GLUCOSE 144*  BUN 17  CREATININE 1.10  CALCIUM 8.4*   GFR: Estimated Creatinine Clearance: 130.1 mL/min (by C-G formula based on SCr of 1.1 mg/dL). Liver Function Tests: Recent Labs  Lab 08/26/18 0110  AST 25  ALT 20  ALKPHOS 110  BILITOT 0.7  PROT 7.2  ALBUMIN 3.1*   No results for input(s): LIPASE, AMYLASE in the last 168 hours. No results for input(s): AMMONIA in the last 168 hours. Coagulation Profile: No results for input(s): INR, PROTIME in the last 168 hours. Cardiac Enzymes: Recent Labs  Lab  08/26/18 0110  TROPONINI <0.03   BNP (last 3 results) No results for input(s): PROBNP in the last 8760 hours. HbA1C: No results for input(s): HGBA1C in the last 72 hours. CBG: No results for input(s): GLUCAP in the last 168 hours. Lipid Profile: No results for input(s): CHOL, HDL, LDLCALC, TRIG, CHOLHDL, LDLDIRECT in the last 72 hours. Thyroid Function Tests: No results for input(s): TSH, T4TOTAL, FREET4, T3FREE, THYROIDAB in the last 72 hours. Anemia Panel: No results for input(s): VITAMINB12, FOLATE, FERRITIN, TIBC, IRON, RETICCTPCT in the last 72 hours. Urine analysis:    Component Value  Date/Time   COLORURINE YELLOW 11/15/2017 1141   APPEARANCEUR CLEAR 11/15/2017 1141   APPEARANCEUR Clear 12/05/2015 1226   LABSPEC 1.021 11/15/2017 1141   PHURINE 5.0 11/15/2017 1141   GLUCOSEU NEGATIVE 11/15/2017 1141   HGBUR SMALL (A) 11/15/2017 1141   BILIRUBINUR NEGATIVE 11/15/2017 1141   BILIRUBINUR Negative 12/05/2015 1226   KETONESUR NEGATIVE 11/15/2017 1141   PROTEINUR NEGATIVE 11/15/2017 1141   UROBILINOGEN 0.2 12/28/2013 1117   NITRITE POSITIVE (A) 11/15/2017 1141   LEUKOCYTESUR NEGATIVE 11/15/2017 1141   LEUKOCYTESUR Negative 12/05/2015 1226   Sepsis Labs: @LABRCNTIP (procalcitonin:4,lacticidven:4) )No results found for this or any previous visit (from the past 240 hour(s)).   Radiological Exams on Admission: Dg Chest 2 View  Result Date: 08/26/2018 CLINICAL DATA:  Wheezing, cough, chest congestion. Progressive symptoms over 2 weeks. EXAM: CHEST - 2 VIEW COMPARISON:  11/15/2017 FINDINGS: Cardiomegaly is unchanged from prior exam. Mild peribronchial thickening. No focal airspace disease, pleural effusion, or pneumothorax. Degenerative change in the spine. Soft tissue attenuation from body habitus limits detailed assessment. IMPRESSION: Chronic cardiomegaly. Mild peribronchial thickening. Electronically Signed   By: Keith Rake M.D.   On: 08/26/2018 01:41    EKG:  Independently reviewed.  Sinus tachycardia.  Assessment/Plan Active Problems:   Hypertension   Drug-induced gout   Acute respiratory failure with hypoxia (HCC)   Acute diastolic CHF (congestive heart failure) ([place])    1. Acute respiratory failure with hypoxia likely from a combination of bronchitis with decompensated CHF.  Last EF measured in 2018 was showing EF of 60 to 65% with grade 3 diastolic dysfunction.  Patient did have adequate diuresis after giving Lasix for which we will continue Lasix 40 mg IV every 12.  I have placed patient on Xopenex and will check respiratory viral panel.  Closely follow intake output metabolic panel and daily weights.  Will check Dopplers of the lower extremity. 2. History of gout on allopurinol. 3. Hypertension on lisinopril. 4. History of diabetes per the chart patient not on any antidiabetic medication.  Last hemoglobin A1c was 5.8.  Check hemoglobin A1c for now I have kept patient on sliding scale coverage and diabetic diet. 5. Morbid obesity. 6. Chronic anemia follow CBC.   DVT prophylaxis: Lovenox. Code Status: Full code. Family Communication: Discussed with patient. Disposition Plan: Home. Consults called: None. Admission status: Inpatient.   Rise Patience MD [facility] Pager [phone].  If 7PM-7AM, please contact night-coverage [email or web address] Password [facility]  08/26/2018, 6:04 AM

## 2018-08-26 NOTE — Progress Notes (Signed)
*  PRELIMINARY RESULTS* Echocardiogram 2D Echocardiogram has been performed.  Ronnie Hernandez 08/26/2018, 3:47 PM

## 2018-08-26 NOTE — Progress Notes (Signed)
VASCULAR LAB PRELIMINARY  PRELIMINARY  PRELIMINARY  PRELIMINARY  Bilateral lower extremity venous duplex completed.    Preliminary report:  See CV proc for results  Marigrace Mccole, RVT 08/26/2018, 3:28 PM

## 2018-08-26 NOTE — ED Triage Notes (Addendum)
Patient arrived with EMS from home reports worsening chest congestion with productive cough/wheezing for 2 weeks unrelieved by inhaler, he received 2 doses of Duoneb by EMS prior to arrival with mild relief. Denies fever or chills . Pt. added bilateral lower legs edema this week .

## 2018-08-27 DIAGNOSIS — M10279 Drug-induced gout, unspecified ankle and foot: Secondary | ICD-10-CM

## 2018-08-27 DIAGNOSIS — J4541 Moderate persistent asthma with (acute) exacerbation: Secondary | ICD-10-CM

## 2018-08-27 DIAGNOSIS — E7849 Other hyperlipidemia: Secondary | ICD-10-CM

## 2018-08-27 DIAGNOSIS — E1165 Type 2 diabetes mellitus with hyperglycemia: Secondary | ICD-10-CM

## 2018-08-27 LAB — CBC WITH DIFFERENTIAL/PLATELET
Abs Immature Granulocytes: 0.02 10*3/uL (ref 0.00–0.07)
Basophils Absolute: 0 10*3/uL (ref 0.0–0.1)
Basophils Relative: 0 %
EOS PCT: 0 %
Eosinophils Absolute: 0 10*3/uL (ref 0.0–0.5)
HCT: 41.1 % (ref 39.0–52.0)
Hemoglobin: 13.4 g/dL (ref 13.0–17.0)
Immature Granulocytes: 0 %
Lymphocytes Relative: 19 %
Lymphs Abs: 1.6 10*3/uL (ref 0.7–4.0)
MCH: 31 pg (ref 26.0–34.0)
MCHC: 32.6 g/dL (ref 30.0–36.0)
MCV: 95.1 fL (ref 80.0–100.0)
Monocytes Absolute: 0.8 10*3/uL (ref 0.1–1.0)
Monocytes Relative: 9 %
Neutro Abs: 6.2 10*3/uL (ref 1.7–7.7)
Neutrophils Relative %: 72 %
Platelets: 198 10*3/uL (ref 150–400)
RBC: 4.32 MIL/uL (ref 4.22–5.81)
RDW: 12.8 % (ref 11.5–15.5)
WBC: 8.7 10*3/uL (ref 4.0–10.5)
nRBC: 0 % (ref 0.0–0.2)

## 2018-08-27 LAB — RESPIRATORY PANEL BY PCR
Adenovirus: NOT DETECTED
Bordetella pertussis: NOT DETECTED
Chlamydophila pneumoniae: NOT DETECTED
Coronavirus 229E: NOT DETECTED
Coronavirus HKU1: NOT DETECTED
Coronavirus NL63: NOT DETECTED
Coronavirus OC43: NOT DETECTED
Influenza A: NOT DETECTED
Influenza B: NOT DETECTED
Metapneumovirus: NOT DETECTED
Mycoplasma pneumoniae: NOT DETECTED
Parainfluenza Virus 1: NOT DETECTED
Parainfluenza Virus 2: NOT DETECTED
Parainfluenza Virus 3: NOT DETECTED
Parainfluenza Virus 4: NOT DETECTED
RHINOVIRUS / ENTEROVIRUS - RVPPCR: NOT DETECTED
Respiratory Syncytial Virus: NOT DETECTED

## 2018-08-27 LAB — BASIC METABOLIC PANEL
Anion gap: 8 (ref 5–15)
BUN: 25 mg/dL — ABNORMAL HIGH (ref 6–20)
CO2: 28 mmol/L (ref 22–32)
Calcium: 8.7 mg/dL — ABNORMAL LOW (ref 8.9–10.3)
Chloride: 102 mmol/L (ref 98–111)
Creatinine, Ser: 1.24 mg/dL (ref 0.61–1.24)
GFR calc non Af Amer: >60 mL/min (ref >60)
Glucose, Bld: 182 mg/dL — ABNORMAL HIGH (ref 70–99)
Potassium: 4.2 mmol/L (ref 3.5–5.1)
Sodium: 138 mmol/L (ref 135–145)

## 2018-08-27 LAB — GLUCOSE, CAPILLARY
Glucose-Capillary: 163 mg/dL — ABNORMAL HIGH (ref 70–99)
Glucose-Capillary: 128 mg/dL — ABNORMAL HIGH (ref 70–99)
Glucose-Capillary: 139 mg/dL — ABNORMAL HIGH (ref 70–99)
Glucose-Capillary: 152 mg/dL — ABNORMAL HIGH (ref 70–99)

## 2018-08-27 LAB — MAGNESIUM: Magnesium: 2.3 mg/dL (ref 1.7–2.4)

## 2018-08-27 MED ORDER — GUAIFENESIN 100 MG/5ML PO SOLN
5.0000 mL | ORAL | Status: DC | PRN
  Administered 2018-08-27 (×2): 100 mg via ORAL
  Filled 2018-08-27 (×2): qty 5

## 2018-08-27 MED ORDER — HYDROCODONE-HOMATROPINE 5-1.5 MG/5ML PO SYRP
5.0000 mL | ORAL_SOLUTION | Freq: Four times a day (QID) | ORAL | Status: DC | PRN
  Administered 2018-08-27 – 2018-08-29 (×2): 5 mL via ORAL
  Filled 2018-08-27 (×2): qty 5

## 2018-08-27 MED ORDER — PANTOPRAZOLE SODIUM 40 MG PO TBEC
40.0000 mg | DELAYED_RELEASE_TABLET | Freq: Every day | ORAL | Status: DC
  Administered 2018-08-27 – 2018-08-29 (×3): 40 mg via ORAL
  Filled 2018-08-27 (×3): qty 1

## 2018-08-27 MED ORDER — GUAIFENESIN ER 600 MG PO TB12
1200.0000 mg | ORAL_TABLET | Freq: Two times a day (BID) | ORAL | Status: DC
  Administered 2018-08-27 – 2018-08-29 (×4): 1200 mg via ORAL
  Filled 2018-08-27 (×4): qty 2

## 2018-08-27 MED ORDER — FLUTICASONE PROPIONATE 50 MCG/ACT NA SUSP
2.0000 | Freq: Every day | NASAL | Status: DC
  Administered 2018-08-27 – 2018-08-29 (×3): 2 via NASAL
  Filled 2018-08-27: qty 16

## 2018-08-27 MED ORDER — METHYLPREDNISOLONE SODIUM SUCC 125 MG IJ SOLR
60.0000 mg | Freq: Three times a day (TID) | INTRAMUSCULAR | Status: DC
  Administered 2018-08-27 – 2018-08-28 (×4): 60 mg via INTRAVENOUS
  Filled 2018-08-27 (×4): qty 2

## 2018-08-27 MED ORDER — AZITHROMYCIN 500 MG PO TABS
500.0000 mg | ORAL_TABLET | Freq: Every day | ORAL | Status: DC
  Administered 2018-08-27 – 2018-08-29 (×3): 500 mg via ORAL
  Filled 2018-08-27 (×3): qty 1

## 2018-08-27 MED ORDER — BUDESONIDE 0.5 MG/2ML IN SUSP
0.5000 mg | Freq: Two times a day (BID) | RESPIRATORY_TRACT | Status: DC
  Administered 2018-08-27 – 2018-08-29 (×5): 0.5 mg via RESPIRATORY_TRACT
  Filled 2018-08-27 (×5): qty 2

## 2018-08-27 NOTE — Progress Notes (Signed)
RT instructed pt on the use of incentive spirometer.  Pt able to reach 925 mL with good technique.

## 2018-08-27 NOTE — Evaluation (Signed)
Physical Therapy Evaluation Patient Details Name: Ronnie Hernandez MRN: [id number] DOB: 04/02/1961 Today's Date: 08/27/2018   History of Present Illness  Patient is a 58 y/o male presenting to the ED on 08/26/2018 with primary complaints of SOB. Past medical history significant of diastolic dysfunction, hypertension, gout, chronic anemia and morbid obesity. Admitted for Acute respiratory failure with hypoxia likely from a combination of bronchitis with decompensated CHF.    Clinical Impression  Patient admitted with the above listed diagnosis. Patient reports difficulty with gait and ADLs prior to admission. Patient stating limited resources currently - could use consult from social work. Patient today with unsteady gait pattern with incorrect use of quad cane - body habitus may be limiting factor. Patient with limited tolerance to ambulation with patient quickly fatiguing. Reports he has DME at home, however w/c and rollator size inadequate for body habitus and no longer functional for patient use. Recommend bariatric size equipment at discharge to aide in functional mobility and independence. PT to continue to follow acutely.    Follow Up Recommendations Home health PT;Supervision - Intermittent    Equipment Recommendations  Wheelchair cushion (measurements PT);Wheelchair (measurements PT)(bariatric size; Insurance claims handler)    Recommendations for Other Services       Precautions / Restrictions Precautions Precautions: Fall Restrictions Weight Bearing Restrictions: No      Mobility  Bed Mobility               General bed mobility comments: up in recliner  Transfers Overall transfer level: Needs assistance Equipment used: Quad cane Transfers: Sit to/from [employer] to Stand: Min guard Stand pivot transfers: Min guard       General transfer comment: use of momentum for mobility; min guard for safety - unsteadiness  Ambulation/Gait Ambulation/Gait  assistance: Min guard Gait Distance (Feet): 75 Feet Assistive device: Quad cane Gait Pattern/deviations: Shuffle Gait velocity: quick LE movements   General Gait Details: body habitus limiting gait mechanics - shuffle gait pattern with use of tip of quad cane for "balance" per patient; unsteadiness, however likely near baseline  Stairs            Wheelchair Mobility    Modified Rankin (Stroke Patients Only)       Balance Overall balance assessment: Needs assistance Sitting-balance support: No upper extremity supported;Feet supported Sitting balance-Leahy Scale: Fair     Standing balance support: Single extremity supported;During functional activity Standing balance-Leahy Scale: Fair                               Pertinent Vitals/Pain Pain Assessment: No/denies pain    Home Living Family/patient expects to be discharged to:: Private residence Living Arrangements: Other relatives(niece) Available Help at Discharge: Family;Available PRN/intermittently Type of Home: Apartment Home Access: Level entry     Home Layout: One level Home Equipment: Walker - 4 wheels;Bedside commode;Tub bench;Cane - quad;Grab bars - tub/shower;Grab bars - toilet;Hand held shower head;Walker - 2 wheels;Wheelchair - Hydrographic surveyor and w/c are too small)      Prior Function Level of Independence: Needs assistance   Gait / Transfers Assistance Needed: typically ambulates with quad cane  ADL's / Homemaking Assistance Needed: needs assistance for ADLs and housecleaning        Hand Dominance   Dominant Hand: Right    Extremity/Trunk Assessment   Upper Extremity Assessment Upper Extremity Assessment: Defer to OT evaluation    Lower Extremity Assessment Lower Extremity Assessment: Generalized  weakness(body habitus likely limiting)    Cervical / Trunk Assessment Cervical / Trunk Assessment: Normal  Communication   Communication: No difficulties  Cognition  Arousal/Alertness: Awake/alert Behavior During Therapy: WFL for tasks assessed/performed Overall Cognitive Status: Within Functional Limits for tasks assessed                                        General Comments      Exercises     Assessment/Plan    PT Assessment Patient needs continued PT services  PT Problem List Decreased strength;Decreased activity tolerance;Decreased balance;Decreased mobility;Decreased safety awareness       PT Treatment Interventions DME instruction;Gait training;Functional mobility training;Therapeutic activities;Therapeutic exercise;Balance training;Patient/family education    PT Goals (Current goals can be found in the Care Plan section)  Acute Rehab PT Goals Patient Stated Goal: improve strength and mobility PT Goal Formulation: With patient Time For Goal Achievement: 09/10/18 Potential to Achieve Goals: Good    Frequency Min 3X/week   Barriers to discharge        Co-evaluation               AM-PAC PT "6 Clicks" Mobility  Outcome Measure Help needed turning from your back to your side while in a flat bed without using bedrails?: A Lot Help needed moving from lying on your back to sitting on the side of a flat bed without using bedrails?: A Lot Help needed moving to and from a bed to a chair (including a wheelchair)?: A Little Help needed standing up from a chair using your arms (e.g., wheelchair or bedside chair)?: A Little Help needed to walk in hospital room?: A Little Help needed climbing 3-5 steps with a railing? : A Lot 6 Click Score: 15    End of Session Equipment Utilized During Treatment: Gait belt Activity Tolerance: Patient tolerated treatment well Patient left: in chair;with call bell/phone within reach;with chair alarm set Nurse Communication: Mobility status PT Visit Diagnosis: Unsteadiness on feet (R26.81);Other abnormalities of gait and mobility (R26.89);Muscle weakness (generalized) (M62.81)     Time: 0998-3382 PT Time Calculation (min) (ACUTE ONLY): 18 min   Charges:   PT Evaluation $PT Eval Moderate Complexity: 1 Mod          Lanney Gins, PT, DPT Supplemental Physical Therapist 08/27/18 11:48 AM Pager: [phone] Office: [phone]

## 2018-08-27 NOTE — Progress Notes (Addendum)
PROGRESS NOTE    Ronnie Hernandez  EQA:[id number] DOB: 1960-11-27 DOA: 08/26/2018 PCP: [name], NP    Brief Narrative:  HPI per [name] is a 58 y.o. male with history of diastolic dysfunction last 2D echo done in February 2018 showed EF of 60 to 65% with grade 3 diastolic dysfunction, hypertension, gout, chronic anemia and morbid obesity presents to the ER because of worsening shortness of breath.  Patient states over last few days patient has been getting increasingly short of breath with increasing lower extremity edema and has been wheezing.  At times has chest pain pressure-like.  Because of worsening shortness of breath patient presented to the ER.  Patient states he has been compliant with his Lasix.  ED Course: In the ER patient is found to be hypoxic and wheezing chest x-ray shows cardiomegaly BNP was normal.  On exam patient looks edematous.  Was given Lasix 40 mg IV and nebulizer treatment admitted for acute respiratory failure with hypoxia likely from worsening CHF but could be also contributed by bronchitis since patient is wheezing.  EKG shows sinus tachycardia.  Hemoglobin 12.4 WBC count 6.2.   Assessment & Plan:   Principal Problem:   Acute respiratory failure with hypoxia (HCC) Active Problems:   Acute diastolic CHF (congestive heart failure) ([place])   Hyperlipidemia   Morbid obesity (HCC)   Hypertension   Type 2 diabetes mellitus (HCC)   Drug-induced gout  1 acute respiratory failure with hypoxia secondary to probable acute asthma exacerbation with bronchitis and acute diastolic CHF. Likely multifactorial secondary to acute diastolic CHF exacerbation, probable bronchitis.  Last 2D echo in 2018 with a EF of 60 to 65% with grade 3 diastolic dysfunction.  Patient with clinical improvement with IV diuretics with a urine output of 5.550 L over the past 24 hours.  Patient however with significant wheezing on examination today with complaints  of congestion and inability to get mucus up.  2D echo done this hospitalization with a EF of 60 to 65%, mildly increased left ventricular wall thickness, indeterminate diastolic function, unable to evaluate focal wall motion.  Respiratory viral panel negative.  Patient denies any ongoing chest pain.  Continue IV diuretics with Lasix.  Will place on IV Solu-Medrol 60 mg every 8 hours due to increased wheezing.  Add Pulmicort, Flonase.  Increase Mucinex to 1200 mg twice daily.  Continue scheduled Xopenex.  Hycodan as needed.  Place on azithromycin.  Discontinue droplet precautions.  2.  Acute diastolic CHF exacerbation Patient presenting with worsening shortness of breath, noted to have some diffuse wheezing on examination.  Patient also with some lower extremity edema.  2D echo done during this hospitalization with a EF of 60 to 65%, mildly increased left ventricular wall thickness, indeterminate diastolic function, unable to evaluate focal wall motion.  Patient with clinical improvement.  Patient with a urine output of 5.550 L over the past 24 hours.  Patient is -8.771 L during this hospitalization.  Lower extremity Dopplers negative.  Continue IV Lasix through today likely transition to oral Lasix tomorrow.  Continue Lipitor, lisinopril.  3.  Hypertension Continue lisinopril.  Patient on IV Lasix.  4.  History of diabetes  per chart not on antidiabetic medication.  Last hemoglobin A1c was 5.8.  Repeat hemoglobin A1c 6.1.  CBG of 163 this morning.  Will need outpatient follow-up.  5.  History of gout Continue allopurinol.  6.  Morbid obesity  7.  Hyperlipidemia Continue statin.  DVT prophylaxis: Lovenox Code Status: Full Family Communication: Updated patient.  No family at bedside. Disposition Plan: Likely home with home health therapies.   Consultants:   None  Procedures:   2D echo 08/26/2018  Chest x-ray 08/26/2018  Lower extremity Dopplers  08/26/2018  Antimicrobials:  Azithromycin 08/27/2018   Subjective: Patient states shortness of breath was improving until he had some diffuse wheezing this morning.  Patient complaining of some congestion and unable to bring up phlegm adequately.  Denies any chest pain.  Eating his lunch.  It was noted per RN that when patient ambulating the hallway with PT patient will with some dyspnea.  Objective: Vitals:   08/27/18 0320 08/27/18 0549 08/27/18 0842 08/27/18 1329  BP:  121/62    Pulse:  81  90  Resp:  18  18  Temp:  (!) 97.5 F (36.4 C)    TempSrc:  Oral    SpO2: 96% 97% 98% 94%  Weight:  (!) 207.9 kg    Height:        Intake/Output Summary (Last 24 hours) at 08/27/2018 1609 Last data filed at 08/27/2018 1203 Gross per 24 hour  Intake 960 ml  Output 4200 ml  Net -3240 ml   Filed Weights   08/26/18 0104 08/26/18 0600 08/27/18 0549  Weight: (!) 215.5 kg (!) 218.5 kg (!) 207.9 kg    Examination:  General exam: Appears calm and comfortable  Respiratory system: Diffuse expiratory wheezing.  Some scattered crackles however improved.  No rhonchi.  Cardiovascular system: S1 & S2 heard, RRR. No JVD, murmurs, rubs, gallops or clicks.  Bilateral lower extremity lymphedema.  1-2+ pedal edema Gastrointestinal system: Abdomen is nondistended, soft and nontender. No organomegaly or masses felt. Normal bowel sounds heard. Central nervous system: Alert and oriented. No focal neurological deficits. Extremities: Symmetric 5 x 5 power.  Bilateral lower extremity lymphedema. Skin: Chronic venous stasis changes noted on bilateral lower extremities. Psychiatry: Judgement and insight appear normal. Mood & affect appropriate.     Data Reviewed: I have personally reviewed following labs and imaging studies  CBC: Recent Labs  Lab 08/26/18 0110 08/26/18 0612 08/27/18 0714  WBC 6.2 6.1 8.7  NEUTROABS 3.5 5.2 6.2  HGB 12.4* 13.5 13.4  HCT 39.8 42.3 41.1  MCV 97.1 94.8 95.1  PLT 174 168 427    Basic Metabolic Panel: Recent Labs  Lab 08/26/18 0110 08/26/18 0612 08/27/18 0714  NA 135 136 138  K 3.7 3.7 4.2  CL 100 99 102  CO2 22 24 28   GLUCOSE 144* 193* 182*  BUN 17 18 25*  CREATININE 1.10 1.13 1.24  CALCIUM 8.4* 8.8* 8.7*  MG  --  2.1 2.3   GFR: Estimated Creatinine Clearance: 111.5 mL/min (by C-G formula based on SCr of 1.24 mg/dL). Liver Function Tests: Recent Labs  Lab 08/26/18 0110 08/26/18 0612  AST 25 28  ALT 20 23  ALKPHOS 110 120  BILITOT 0.7 0.8  PROT 7.2 7.7  ALBUMIN 3.1* 3.2*   No results for input(s): LIPASE, AMYLASE in the last 168 hours. No results for input(s): AMMONIA in the last 168 hours. Coagulation Profile: No results for input(s): INR, PROTIME in the last 168 hours. Cardiac Enzymes: Recent Labs  Lab 08/26/18 0110  TROPONINI <0.03   BNP (last 3 results) No results for input(s): PROBNP in the last 8760 hours. HbA1C: Recent Labs    08/26/18 0612  HGBA1C 6.1*   CBG: Recent Labs  Lab 08/26/18 1121 08/26/18 1642 08/26/18 2124  08/27/18 0601 08/27/18 1119  GLUCAP 189* 185* 197* 163* 128*   Lipid Profile: No results for input(s): CHOL, HDL, LDLCALC, TRIG, CHOLHDL, LDLDIRECT in the last 72 hours. Thyroid Function Tests: Recent Labs    08/26/18 0612  TSH 2.585   Anemia Panel: No results for input(s): VITAMINB12, FOLATE, FERRITIN, TIBC, IRON, RETICCTPCT in the last 72 hours. Sepsis Labs: No results for input(s): PROCALCITON, LATICACIDVEN in the last 168 hours.  Recent Results (from the past 240 hour(s))  Respiratory Panel by PCR     Status: None   Collection Time: 08/26/18  6:56 AM  Result Value Ref Range Status   Adenovirus NOT DETECTED NOT DETECTED Final   Coronavirus 229E NOT DETECTED NOT DETECTED Final    Comment: (NOTE) The Coronavirus on the Respiratory Panel, DOES NOT test for the novel  Coronavirus (2019 nCoV)    Coronavirus HKU1 NOT DETECTED NOT DETECTED Final   Coronavirus NL63 NOT DETECTED NOT DETECTED  Final   Coronavirus OC43 NOT DETECTED NOT DETECTED Final   Metapneumovirus NOT DETECTED NOT DETECTED Final   Rhinovirus / Enterovirus NOT DETECTED NOT DETECTED Final   Influenza A NOT DETECTED NOT DETECTED Final   Influenza B NOT DETECTED NOT DETECTED Final   Parainfluenza Virus 1 NOT DETECTED NOT DETECTED Final   Parainfluenza Virus 2 NOT DETECTED NOT DETECTED Final   Parainfluenza Virus 3 NOT DETECTED NOT DETECTED Final   Parainfluenza Virus 4 NOT DETECTED NOT DETECTED Final   Respiratory Syncytial Virus NOT DETECTED NOT DETECTED Final   Bordetella pertussis NOT DETECTED NOT DETECTED Final   Chlamydophila pneumoniae NOT DETECTED NOT DETECTED Final   Mycoplasma pneumoniae NOT DETECTED NOT DETECTED Final    Comment: Performed at [facility], [address], [place], [place] [address]         Radiology Studies: Dg Chest 2 View  Result Date: 08/26/2018 CLINICAL DATA:  Wheezing, cough, chest congestion. Progressive symptoms over 2 weeks. EXAM: CHEST - 2 VIEW COMPARISON:  11/15/2017 FINDINGS: Cardiomegaly is unchanged from prior exam. Mild peribronchial thickening. No focal airspace disease, pleural effusion, or pneumothorax. Degenerative change in the spine. Soft tissue attenuation from body habitus limits detailed assessment. IMPRESSION: Chronic cardiomegaly. Mild peribronchial thickening. Electronically Signed   By: Keith Rake M.D.   On: 08/26/2018 01:41   Vas [place] Lower Extremity Venous (dvt)  Result Date: 08/26/2018  Lower Venous Study Indications: Edema.  Limitations: Body habitus and poor ultrasound/tissue interface. Comparison Study: Prior study on 03/31/18 is available for comparison Performing Technologist: Sharion Dove RVS Supporting Technologist: Maudry Mayhew RDMS, RVT, RDCS  Examination Guidelines: A complete evaluation includes B-mode imaging, spectral Doppler, color Doppler, and power Doppler as needed of all accessible portions of each vessel. Bilateral  testing is considered an integral part of a complete examination. Limited examinations for reoccurring indications may be performed as noted.  Right Venous Findings: +---------+---------------+---------+-----------+----------+----------+          CompressibilityPhasicitySpontaneityPropertiesSummary    +---------+---------------+---------+-----------+----------+----------+ CFV                                                   visualized +---------+---------------+---------+-----------+----------+----------+ FV Prox  visualized +---------+---------------+---------+-----------+----------+----------+ FV Mid                                                visualized +---------+---------------+---------+-----------+----------+----------+ FV Distal                                             visualized +---------+---------------+---------+-----------+----------+----------+ POP      Full                                         visualized +---------+---------------+---------+-----------+----------+----------+ PTV                                                   visualized +---------+---------------+---------+-----------+----------+----------+ PERO                                                  visualized +---------+---------------+---------+-----------+----------+----------+  Right Technical Findings: Not visualized segments include profunda vein.  Left Venous Findings: +---------+---------------+---------+-----------+----------+----------+          CompressibilityPhasicitySpontaneityPropertiesSummary    +---------+---------------+---------+-----------+----------+----------+ CFV      Full           Yes      Yes                             +---------+---------------+---------+-----------+----------+----------+ FV Prox  Full                                                     +---------+---------------+---------+-----------+----------+----------+ FV Mid                                                visualized +---------+---------------+---------+-----------+----------+----------+ FV Distal                                             visualized +---------+---------------+---------+-----------+----------+----------+ POP                                                   visualized +---------+---------------+---------+-----------+----------+----------+ PTV                                                   visualized +---------+---------------+---------+-----------+----------+----------+  Left Technical Findings: Not visualized segments include profunda, peroneal.   Summary: Right: There is no evidence of deep vein thrombosis in the lower extremity. However, portions of this examination were limited- see technologist comments above. Left: There is no evidence of deep vein thrombosis in the lower extremity. However, portions of this examination were limited- see technologist comments above.  *See table(s) above for measurements and observations. Electronically signed by Monica Martinez MD on 08/26/2018 at 4:29:33 PM.    Final         Scheduled Meds: . allopurinol  300 mg Oral BID  . atorvastatin  10 mg Oral q1800  . budesonide (PULMICORT) nebulizer solution  0.5 mg Nebulization BID  . enoxaparin (LOVENOX) injection  100 mg Subcutaneous Q24H  . fluticasone  2 spray Each Nare Daily  . furosemide  40 mg Intravenous Q12H  . guaiFENesin  1,200 mg Oral BID  . insulin aspart  0-9 Units Subcutaneous TID WC  . levalbuterol  0.63 mg Nebulization Q6H  . lisinopril  2.5 mg Oral Daily  . methylPREDNISolone (SOLU-MEDROL) injection  60 mg Intravenous Q8H  . pantoprazole  40 mg Oral Q0600   Continuous Infusions:   LOS: 1 day    Time spent: 35 minutes    Irine Seal, MD [facility]  If 7PM-7AM, please contact  night-coverage [email or web address] 08/27/2018, 4:09 PM

## 2018-08-28 LAB — CBC WITH DIFFERENTIAL/PLATELET
Abs Immature Granulocytes: 0 10*3/uL (ref 0.00–0.07)
Basophils Absolute: 0 10*3/uL (ref 0.0–0.1)
Basophils Relative: 0 %
Eosinophils Absolute: 0 10*3/uL (ref 0.0–0.5)
Eosinophils Relative: 0 %
HCT: 44.4 % (ref 39.0–52.0)
Hemoglobin: 14.2 g/dL (ref 13.0–17.0)
LYMPHS ABS: 0.6 10*3/uL — AB (ref 0.7–4.0)
Lymphocytes Relative: 8 %
MCH: 30.3 pg (ref 26.0–34.0)
MCHC: 32 g/dL (ref 30.0–36.0)
MCV: 94.9 fL (ref 80.0–100.0)
Monocytes Absolute: 0.1 10*3/uL (ref 0.1–1.0)
Monocytes Relative: 1 %
NRBC: 0 % (ref 0.0–0.2)
Neutro Abs: 6.6 10*3/uL (ref 1.7–7.7)
Neutrophils Relative %: 91 %
Platelets: 197 10*3/uL (ref 150–400)
RBC: 4.68 MIL/uL (ref 4.22–5.81)
RDW: 12.8 % (ref 11.5–15.5)
WBC: 7.3 10*3/uL (ref 4.0–10.5)
nRBC: 0 /100 WBC

## 2018-08-28 LAB — GLUCOSE, CAPILLARY
Glucose-Capillary: 180 mg/dL — ABNORMAL HIGH (ref 70–99)
Glucose-Capillary: 205 mg/dL — ABNORMAL HIGH (ref 70–99)
Glucose-Capillary: 203 mg/dL — ABNORMAL HIGH (ref 70–99)
Glucose-Capillary: 232 mg/dL — ABNORMAL HIGH (ref 70–99)

## 2018-08-28 LAB — BASIC METABOLIC PANEL
Anion gap: 13 (ref 5–15)
BUN: 32 mg/dL — ABNORMAL HIGH (ref 6–20)
CO2: 26 mmol/L (ref 22–32)
Calcium: 8.6 mg/dL — ABNORMAL LOW (ref 8.9–10.3)
Chloride: 98 mmol/L (ref 98–111)
Creatinine, Ser: 1.18 mg/dL (ref 0.61–1.24)
GFR calc Af Amer: >60 mL/min (ref >60)
GFR calc non Af Amer: >60 mL/min (ref >60)
Glucose, Bld: 202 mg/dL — ABNORMAL HIGH (ref 70–99)
Potassium: 4.5 mmol/L (ref 3.5–5.1)
Sodium: 137 mmol/L (ref 135–145)

## 2018-08-28 MED ORDER — METHYLPREDNISOLONE SODIUM SUCC 125 MG IJ SOLR
60.0000 mg | Freq: Two times a day (BID) | INTRAMUSCULAR | Status: DC
  Administered 2018-08-29 (×2): 60 mg via INTRAVENOUS
  Filled 2018-08-28 (×2): qty 2

## 2018-08-28 NOTE — Plan of Care (Signed)
Nutrition Education Note  RD consulted for nutrition education regarding CHF.  Pt seen per request of Dr. Grandville Silos, who reports that pt has 2 L urine output today and is significantly volume overloaded. MD believes pt would benefit from diet reinforcement.   Spoke with pt, who was in good spirits at time of visit. He reports his niece does the cooking for his household. He typically consumes 3 meals per day (Breakfast: cereal OR grits OR breakfast sandwich; Lunch: 2 packets of [employer]; Dinner: meat, starch, and vegetable). Pt shares his niece does not cook with salt, but instead uses seasonings such as garlic powder to season foods. He does no add salt to the table. Niece prepares mostly fresh vegetables.   Focus on education was on ways pt could find lower sodium alternatives for convenience items. Pt has surprised to find out how much sodium were in many processed foods, such as his [employer]. He is motivated to make a change- he voiced to this RD that he is trying to save money to buy a large Lucianne Lei so that he can return to the gym to participate in water aerobics. He recently started chair exercises.   RD provided "Low Sodium Nutrition Therapy" handout from the Academy of [employer] and Dietetics. Reviewed patient's dietary recall. Provided examples on ways to decrease sodium intake in diet. Discouraged intake of processed foods and use of salt shaker. Encouraged fresh fruits and vegetables as well as whole grain sources of carbohydrates to maximize fiber intake.   RD discussed why it is important for patient to adhere to diet recommendations, and emphasized the role of fluids, foods to avoid, and importance of weighing self daily. Teach back method used.  Expect fair to good compliance.  Body mass index is 74.97 kg/m. Pt meets criteria for extreme obesity, class III based on current BMI.  Current diet order is heart healthy/ carb modified, patient is consuming  approximately 100% of meals at this time. Labs and medications reviewed. No further nutrition interventions warranted at this time. RD contact information provided. If additional nutrition issues arise, please re-consult RD.   Ronnie Hernandez A. Jimmye Norman, RD, LDN, [name] Registered Dietitian II Certified Diabetes Care and Education Specialist Pager: [phone] After hours Pager: [phone]

## 2018-08-28 NOTE — Clinical Social Work Note (Signed)
Clinical Social Work Assessment  Patient Details  Name: Ronnie Hernandez MRN: [id number] Date of Birth: 10-25-1960  Date of referral:  08/28/18               Reason for consult:  Discharge Planning                Permission sought to share information with:  Other(Home Health Agencies) Permission granted to share information::  Yes, Verbal Permission Granted  Name::        Agency::  [facility]  Relationship::     Contact Information:     Housing/Transportation Living arrangements for the past 2 months:  [place] of Information:  Patient, Medical Team Patient Interpreter Needed:  None Criminal Activity/Legal Involvement Pertinent to Current Situation/Hospitalization:  No - Comment as needed Significant Relationships:  Other Family Members, Siblings, Friend Lives with:  Relatives(Niece and her children) Do you feel safe going back to the place where you live?  Yes Need for family participation in patient care:  Yes (Comment)  Care giving concerns:  PT recommending home health services when stable for discharge.   Social Worker assessment / plan:  CSW met with patient. No supports at bedside. CSW introduced role and explained that PT recommendations would be discussed. Patient agreeable to home health. Patient said he used to have home health with [facility] and requested services through them but they declined. Referral made to [facility] and they accepted. Patient requesting a bariatric 3-in-1, wheelchair, and rollator. He got a wheelchair in 2018 but said it's not the size he needs but has had difficulty getting another one. Patient has a 2-wheeled walker at home and a cane. Patient has a private pay caregiver that comes for two hours Monday, Wednesday, Friday to help him get dressed and do his laundry. Patient stated he goes to Urgent Care for primary care and follows up with Sunday Spillers, NP. His pharmacy is the [facility] between [facility]. He typically has family pick up his medications. No further concerns. CSW encouraged patient to contact CSW as needed. CSW will continue to follow patient for support and facilitate discharge home once medically stable.  Employment status:  Disabled (Comment on whether or not currently receiving Disability) Insurance information:  Managed Medicare PT Recommendations:  Home with [place] / Referral to community resources:  Other (Comment Required)(Home Health)  Patient/Family's Response to care:  Patient agreeable to home health. Patient's family supportive and involved in patient's care. Patient appreciated social work intervention.  Patient/Family's Understanding of and Emotional Response to Diagnosis, Current Treatment, and Prognosis:  Patient has a good understanding of the reason for admission and his need for home health services. Patient appears happy with hospital care.  Emotional Assessment Appearance:  Appears stated age Attitude/Demeanor/Rapport:  Engaged, Gracious Affect (typically observed):  Accepting, Appropriate, Calm, Pleasant Orientation:  Oriented to Self, Oriented to Place, Oriented to  Time, Oriented to Situation Alcohol / Substance use:  Never Used Psych involvement (Current and /or in the community):  No (Comment)  Discharge Needs  Concerns to be addressed:  Care Coordination Readmission within the last 30 days:  No Current discharge risk:  Dependent with Mobility Barriers to Discharge:  Continued Medical Work up   Candie Chroman, LCSW 08/28/2018, 3:50 PM

## 2018-08-28 NOTE — Evaluation (Addendum)
**Note Ronnie-Identified via Obfuscation** Occupational Therapy Evaluation Patient Details Name: Ronnie Hernandez MRN: [id number] DOB: 04-04-1961 Today's Date: 08/28/2018    History of Present Illness Patient is a 58 y/o male presenting to the ED on 08/26/2018 with primary complaints of SOB. Past medical history significant of diastolic dysfunction, hypertension, gout, chronic anemia and morbid obesity. Admitted for Acute respiratory failure with hypoxia likely from a combination of bronchitis with decompensated CHF.   Clinical Impression   This 58 y/o male presents with the above. Pt presents sitting up in recliner pleasant and willing to participate in therapy session. PTA pt reports completing short distance mobility using quad cane, was receiving assist for ADL completion and has an aide who assists 3x/week. Pt demonstrating room level mobility this session with close minguard assist and use of quad cane - pt with preference for using cane in his own manner. He currently requires modA for UB ADL, maxA for LB ADL at this time, requiring increased assist partly due to pt body habitus. Pt with mild dyspnea during room level activity with SpO2 94% on RA. He is motivated to return to his PLOF and will benefit from continued acute OT services; recommend follow up [facility] therapy services to maximize his safety and independence with ADL and mobility. Will follow.     Follow Up Recommendations  Home health OT;Supervision/Assistance - 24 hour    Equipment Recommendations  3 in 1 bedside commode(will require bariatric BSC)           Precautions / Restrictions Precautions Precautions: Fall Restrictions Weight Bearing Restrictions: No      Mobility Bed Mobility               General bed mobility comments: up in recliner  Transfers Overall transfer level: Needs assistance Equipment used: Quad cane Transfers: Sit to/from [facility] Sit to Stand: Min guard Stand pivot transfers: Min guard       General transfer  comment: use of momentum for mobility; min guard for safety - unsteadiness    Balance Overall balance assessment: Needs assistance Sitting-balance support: No upper extremity supported;Feet supported Sitting balance-Leahy Scale: Fair     Standing balance support: Single extremity supported;During functional activity Standing balance-Leahy Scale: Fair                             ADL either performed or assessed with clinical judgement   ADL Overall ADL's : Needs assistance/impaired Eating/Feeding: Modified independent;Sitting   Grooming: Set up;Minimal assistance;Sitting Grooming Details (indicate cue type and reason): to open containers Upper Body Bathing: Minimal assistance;Sitting Upper Body Bathing Details (indicate cue type and reason): difficult to fully reach across abdomen and wash underarms Lower Body Bathing: Maximal assistance;Sit to/from stand Lower Body Bathing Details (indicate cue type and reason): minguard static standing balance Upper Body Dressing : Moderate assistance;Sitting   Lower Body Dressing: Maximal assistance;Sit to/from stand Lower Body Dressing Details (indicate cue type and reason): assist to don shoes today Toilet Transfer: Min guard;Ambulation;Requires wide/bariatric Toilet Transfer Details (indicate cue type and reason): simulated via transfer to/from recliner Toileting- Clothing Manipulation and Hygiene: Maximal assistance;Sit to/from stand       Functional mobility during ADLs: Min guard;Cane General ADL Comments: pt with decreased independence in ADL status partly due to body habitus, will benefit from AE education for LB and toileting ADL;  pt has his own method for using quad cane with mobility     Vision  Perception     Praxis      Pertinent Vitals/Pain Pain Assessment: No/denies pain     Hand Dominance Right   Extremity/Trunk Assessment Upper Extremity Assessment Upper Extremity Assessment: RUE  deficits/detail;LUE deficits/detail RUE Deficits / Details: contractures noted in digits, decreased ability to fully flex digits or grip; edema noted in dorsal aspect of hands RUE Coordination: decreased fine motor LUE Deficits / Details: contractures noted in digits, decreased ability to fully flex digits or grip; edema noted in dorsal aspect of hands LUE Coordination: decreased fine motor   Lower Extremity Assessment Lower Extremity Assessment: Defer to PT evaluation   Cervical / Trunk Assessment Cervical / Trunk Assessment: Normal   Communication Communication Communication: No difficulties   Cognition Arousal/Alertness: Awake/alert Behavior During Therapy: WFL for tasks assessed/performed Overall Cognitive Status: Within Functional Limits for tasks assessed                                     General Comments  pt with mild dyspnea noted during room level activity, SpO2 94% on RA    Exercises     Shoulder Instructions      Home Living Family/patient expects to be discharged to:: Private residence Living Arrangements: Other relatives(niece) Available Help at Discharge: Family;Available PRN/intermittently Type of Home: Apartment Home Access: Level entry     Home Layout: One level     Bathroom Shower/Tub: Occupational psychologist: Handicapped height Bathroom Accessibility: Yes   Home Equipment: Environmental consultant - 4 wheels;Bedside commode;Tub bench;Cane - quad;Grab bars - tub/shower;Grab bars - toilet;Hand held shower head;Walker - 2 wheels;Wheelchair - manual(reports rollator w/c and BSC are too small)          Prior Functioning/Environment Level of Independence: Needs assistance  Gait / Transfers Assistance Needed: typically ambulates with quad cane ADL's / Homemaking Assistance Needed: needs assistance for ADLs and housecleaning; reports he has an aide 3 days/wk who assists with showers (shower transfers), ADL/iADL needs            OT Problem  List: Decreased strength;Decreased range of motion;Decreased activity tolerance;Impaired balance (sitting and/or standing);Decreased knowledge of use of DME or AE;Obesity;Impaired UE functional use      OT Treatment/Interventions: Self-care/ADL training;Therapeutic exercise;Neuromuscular education;DME and/or AE instruction;Therapeutic activities;Patient/family education;Balance training    OT Goals(Current goals can be found in the care plan section) Acute Rehab OT Goals Patient Stated Goal: improve strength and mobility OT Goal Formulation: With patient Time For Goal Achievement: 09/11/18 Potential to Achieve Goals: Good  OT Frequency: Min 2X/week   Barriers to D/C:            Co-evaluation              AM-PAC OT "6 Clicks" Daily Activity     Outcome Measure Help from another person eating meals?: None Help from another person taking care of personal grooming?: A Little Help from another person toileting, which includes using toliet, bedpan, or urinal?: A Lot Help from another person bathing (including washing, rinsing, drying)?: A Lot Help from another person to put on and taking off regular upper body clothing?: A Lot Help from another person to put on and taking off regular lower body clothing?: A Lot 6 Click Score: 15   End of Session Equipment Utilized During Treatment: Gait belt;Other (comment)(quad cane) Nurse Communication: Mobility status  Activity Tolerance: Patient tolerated treatment well Patient left: in chair;with call bell/phone  within reach;with nursing/sitter in room(NT present)  OT Visit Diagnosis: Muscle weakness (generalized) (M62.81);Unsteadiness on feet (R26.81)                Time: 7494-4967 OT Time Calculation (min): 19 min Charges:  OT General Charges $OT Visit: 1 Visit OT Evaluation $OT Eval Moderate Complexity: 1 Mod  Lou Cal, OT [employer] Pager [phone] Office [phone]   Raymondo Band 08/28/2018, 11:05 AM

## 2018-08-28 NOTE — Progress Notes (Signed)
PROGRESS NOTE    Ronnie Hernandez  TDD:[id number] DOB: 1961-03-20 DOA: 08/26/2018 PCP: [name], NP    Brief Narrative:  HPI per [name] is a 58 y.o. male with history of diastolic dysfunction last 2D echo done in February 2018 showed EF of 60 to 65% with grade 3 diastolic dysfunction, hypertension, gout, chronic anemia and morbid obesity presents to the ER because of worsening shortness of breath.  Patient states over last few days patient has been getting increasingly short of breath with increasing lower extremity edema and has been wheezing.  At times has chest pain pressure-like.  Because of worsening shortness of breath patient presented to the ER.  Patient states he has been compliant with his Lasix.  ED Course: In the ER patient is found to be hypoxic and wheezing chest x-ray shows cardiomegaly BNP was normal.  On exam patient looks edematous.  Was given Lasix 40 mg IV and nebulizer treatment admitted for acute respiratory failure with hypoxia likely from worsening CHF but could be also contributed by bronchitis since patient is wheezing.  EKG shows sinus tachycardia.  Hemoglobin 12.4 WBC count 6.2.   Assessment & Plan:   Principal Problem:   Acute respiratory failure with hypoxia (HCC) Active Problems:   Acute diastolic CHF (congestive heart failure) (HCC)   Hyperlipidemia   Morbid obesity (HCC)   Hypertension   Type 2 diabetes mellitus (HCC)   Drug-induced gout   Moderate persistent asthma with exacerbation  1 acute respiratory failure with hypoxia secondary to probable acute asthma exacerbation with bronchitis and acute diastolic CHF. Likely multifactorial secondary to acute diastolic CHF exacerbation, probable bronchitis.  Last 2D echo in 2018 with a EF of 60 to 65% with grade 3 diastolic dysfunction.  Patient with clinical improvement with IV diuretics with a urine output of 4.850 L over the past 24 hours.  Patient returned significant  wheezing on 08/27/2018 with complaints of congestion which improved with starting IV steroids and increasing Mucinex.  Respiratory viral panel negative. 2D echo done this hospitalization with a EF of 60 to 65%, mildly increased left ventricular wall thickness, indeterminate diastolic function, unable to evaluate focal wall motion.  Respiratory viral panel negative.  Patient denies any ongoing chest pain.  Continue IV diuretics with Lasix.  Taper IV Solu-Medrol to 60 mg every 12 hours as wheezing improving.  Continue Pulmicort, Flonase, Mucinex, and Xopenex nebs, azithromycin, Hycodan as needed.  Droplet precautions have been discontinued.  2.  Acute diastolic CHF exacerbation Patient presenting with worsening shortness of breath, noted to have some diffuse wheezing on examination.  Patient also with some lower extremity edema.  2D echo done during this hospitalization with a EF of 60 to 65%, mildly increased left ventricular wall thickness, indeterminate diastolic function, unable to evaluate focal wall motion.  Patient with clinical improvement.  Patient with a urine output of 4.850 L over the past 24 hours.  Patient is - 12.846 L during this hospitalization.  Lower extremity Dopplers negative.  Continue IV Lasix through today likely transition to oral Lasix tomorrow.  Continue Lipitor, lisinopril.  3.  Hypertension Continue lisinopril.  Patient on IV Lasix.  4.  History of diabetes  per chart not on antidiabetic medication.  Last hemoglobin A1c was 5.8.  Repeat hemoglobin A1c 6.1.  CBG of 180 this morning.  Will need outpatient follow-up.  5.  History of gout Continue allopurinol.  6.  Morbid obesity  7.  Hyperlipidemia Continue statin.  DVT prophylaxis: Lovenox Code Status: Full Family Communication: Updated patient.  No family at bedside. Disposition Plan: Likely home with home health therapies.   Consultants:   None  Procedures:   2D echo 08/26/2018  Chest x-ray 08/26/2018  Lower  extremity Dopplers 08/26/2018  Antimicrobials:  Azithromycin 08/27/2018   Subjective: Patient states wheezing improved after being started on IV steroids and nebulizer treatments.  Shortness of breath improved.  Congestion improving.  Feeling better.   Objective: Vitals:   08/28/18 0544 08/28/18 0850 08/28/18 0851 08/28/18 1017  BP: 117/80   134/75  Pulse: 78   88  Resp: 18   18  Temp: (!) 97.5 F (36.4 C)   (!) 97.5 F (36.4 C)  TempSrc: Oral   Oral  SpO2: 96% 93% 93% 94%  Weight: (!) 210.7 kg     Height:        Intake/Output Summary (Last 24 hours) at 08/28/2018 1252 Last data filed at 08/28/2018 1242 Gross per 24 hour  Intake 900 ml  Output 4975 ml  Net -4075 ml   Filed Weights   08/26/18 0600 08/27/18 0549 08/28/18 0544  Weight: (!) 218.5 kg (!) 207.9 kg (!) 210.7 kg    Examination:  General exam: NAD Respiratory system: Decreased expiratory wheezing.  Some bibasilar crackles.  No rhonchi.  Speaking in full sentences.  No use of accessory muscles of respiration.  Cardiovascular system: Regular rate rhythm no murmurs rubs or gallops.  No JVD.  1+ pedal edema. Gastrointestinal system: Abdomen is soft, nontender, nondistended, positive bowel sounds.  No rebound.  No guarding. Central nervous system: Alert and oriented. No focal neurological deficits. Extremities: Symmetric 5 x 5 power.  Bilateral lower extremity lymphedema. Skin: Chronic venous stasis changes noted on bilateral lower extremities. Psychiatry: Judgement and insight appear normal. Mood & affect appropriate.     Data Reviewed: I have personally reviewed following labs and imaging studies  CBC: Recent Labs  Lab 08/26/18 0110 08/26/18 0612 08/27/18 0714 08/28/18 0521  WBC 6.2 6.1 8.7 7.3  NEUTROABS 3.5 5.2 6.2 6.6  HGB 12.4* 13.5 13.4 14.2  HCT 39.8 42.3 41.1 44.4  MCV 97.1 94.8 95.1 94.9  PLT 174 168 198 536   Basic Metabolic Panel: Recent Labs  Lab 08/26/18 0110 08/26/18 0612 08/27/18 0714  08/28/18 0521  NA 135 136 138 137  K 3.7 3.7 4.2 4.5  CL 100 99 102 98  CO2 22 24 28 26   GLUCOSE 144* 193* 182* 202*  BUN 17 18 25* 32*  CREATININE 1.10 1.13 1.24 1.18  CALCIUM 8.4* 8.8* 8.7* 8.6*  MG  --  2.1 2.3  --    GFR: Estimated Creatinine Clearance: 118.3 mL/min (by C-G formula based on SCr of 1.18 mg/dL). Liver Function Tests: Recent Labs  Lab 08/26/18 0110 08/26/18 0612  AST 25 28  ALT 20 23  ALKPHOS 110 120  BILITOT 0.7 0.8  PROT 7.2 7.7  ALBUMIN 3.1* 3.2*   No results for input(s): LIPASE, AMYLASE in the last 168 hours. No results for input(s): AMMONIA in the last 168 hours. Coagulation Profile: No results for input(s): INR, PROTIME in the last 168 hours. Cardiac Enzymes: Recent Labs  Lab 08/26/18 0110  TROPONINI <0.03   BNP (last 3 results) No results for input(s): PROBNP in the last 8760 hours. HbA1C: Recent Labs    08/26/18 0612  HGBA1C 6.1*   CBG: Recent Labs  Lab 08/27/18 0601 08/27/18 1119 08/27/18 1652 08/27/18 2138 08/28/18 0610  GLUCAP  163* 128* 139* 152* 180*   Lipid Profile: No results for input(s): CHOL, HDL, LDLCALC, TRIG, CHOLHDL, LDLDIRECT in the last 72 hours. Thyroid Function Tests: Recent Labs    08/26/18 0612  TSH 2.585   Anemia Panel: No results for input(s): VITAMINB12, FOLATE, FERRITIN, TIBC, IRON, RETICCTPCT in the last 72 hours. Sepsis Labs: No results for input(s): PROCALCITON, LATICACIDVEN in the last 168 hours.  Recent Results (from the past 240 hour(s))  Respiratory Panel by PCR     Status: None   Collection Time: 08/26/18  6:56 AM  Result Value Ref Range Status   Adenovirus NOT DETECTED NOT DETECTED Final   Coronavirus 229E NOT DETECTED NOT DETECTED Final    Comment: (NOTE) The Coronavirus on the Respiratory Panel, DOES NOT test for the novel  Coronavirus (2019 nCoV)    Coronavirus HKU1 NOT DETECTED NOT DETECTED Final   Coronavirus NL63 NOT DETECTED NOT DETECTED Final   Coronavirus OC43 NOT  DETECTED NOT DETECTED Final   Metapneumovirus NOT DETECTED NOT DETECTED Final   Rhinovirus / Enterovirus NOT DETECTED NOT DETECTED Final   Influenza A NOT DETECTED NOT DETECTED Final   Influenza B NOT DETECTED NOT DETECTED Final   Parainfluenza Virus 1 NOT DETECTED NOT DETECTED Final   Parainfluenza Virus 2 NOT DETECTED NOT DETECTED Final   Parainfluenza Virus 3 NOT DETECTED NOT DETECTED Final   Parainfluenza Virus 4 NOT DETECTED NOT DETECTED Final   Respiratory Syncytial Virus NOT DETECTED NOT DETECTED Final   Bordetella pertussis NOT DETECTED NOT DETECTED Final   Chlamydophila pneumoniae NOT DETECTED NOT DETECTED Final   Mycoplasma pneumoniae NOT DETECTED NOT DETECTED Final    Comment: Performed at [facility], [address] [place], [place] [address]         Radiology Studies: Vas [place] Lower Extremity Venous (dvt)  Result Date: 08/26/2018  Lower Venous Study Indications: Edema.  Limitations: Body habitus and poor ultrasound/tissue interface. Comparison Study: Prior study on 03/31/18 is available for comparison Performing Technologist: Sharion Dove RVS Supporting Technologist: Maudry Mayhew RDMS, RVT, RDCS  Examination Guidelines: A complete evaluation includes B-mode imaging, spectral Doppler, color Doppler, and power Doppler as needed of all accessible portions of each vessel. Bilateral testing is considered an integral part of a complete examination. Limited examinations for reoccurring indications may be performed as noted.  Right Venous Findings: +---------+---------------+---------+-----------+----------+----------+          CompressibilityPhasicitySpontaneityPropertiesSummary    +---------+---------------+---------+-----------+----------+----------+ CFV                                                   visualized +---------+---------------+---------+-----------+----------+----------+ FV Prox                                               visualized  +---------+---------------+---------+-----------+----------+----------+ FV Mid                                                visualized +---------+---------------+---------+-----------+----------+----------+ FV Distal  visualized +---------+---------------+---------+-----------+----------+----------+ POP      Full                                         visualized +---------+---------------+---------+-----------+----------+----------+ PTV                                                   visualized +---------+---------------+---------+-----------+----------+----------+ PERO                                                  visualized +---------+---------------+---------+-----------+----------+----------+  Right Technical Findings: Not visualized segments include profunda vein.  Left Venous Findings: +---------+---------------+---------+-----------+----------+----------+          CompressibilityPhasicitySpontaneityPropertiesSummary    +---------+---------------+---------+-----------+----------+----------+ CFV      Full           Yes      Yes                             +---------+---------------+---------+-----------+----------+----------+ FV Prox  Full                                                    +---------+---------------+---------+-----------+----------+----------+ FV Mid                                                visualized +---------+---------------+---------+-----------+----------+----------+ FV Distal                                             visualized +---------+---------------+---------+-----------+----------+----------+ POP                                                   visualized +---------+---------------+---------+-----------+----------+----------+ PTV                                                   visualized  +---------+---------------+---------+-----------+----------+----------+  Left Technical Findings: Not visualized segments include profunda, peroneal.   Summary: Right: There is no evidence of deep vein thrombosis in the lower extremity. However, portions of this examination were limited- see technologist comments above. Left: There is no evidence of deep vein thrombosis in the lower extremity. However, portions of this examination were limited- see technologist comments above.  *See table(s) above for measurements and observations. Electronically signed by Monica Martinez MD on 08/26/2018 at 4:29:33 PM.    Final         Scheduled Meds: . allopurinol  300 mg Oral  BID  . atorvastatin  10 mg Oral q1800  . azithromycin  500 mg Oral Daily  . budesonide (PULMICORT) nebulizer solution  0.5 mg Nebulization BID  . enoxaparin (LOVENOX) injection  100 mg Subcutaneous Q24H  . fluticasone  2 spray Each Nare Daily  . furosemide  40 mg Intravenous Q12H  . guaiFENesin  1,200 mg Oral BID  . insulin aspart  0-9 Units Subcutaneous TID WC  . levalbuterol  0.63 mg Nebulization Q6H  . lisinopril  2.5 mg Oral Daily  . methylPREDNISolone (SOLU-MEDROL) injection  60 mg Intravenous Q8H  . pantoprazole  40 mg Oral Q0600   Continuous Infusions:   LOS: 2 days    Time spent: 35 minutes    Irine Seal, MD [facility]  If 7PM-7AM, please contact night-coverage [email or web address] 08/28/2018, 12:52 PM

## 2018-08-29 LAB — BASIC METABOLIC PANEL
Anion gap: 12 (ref 5–15)
BUN: 40 mg/dL — ABNORMAL HIGH (ref 6–20)
CO2: 28 mmol/L (ref 22–32)
Calcium: 8.6 mg/dL — ABNORMAL LOW (ref 8.9–10.3)
Chloride: 97 mmol/L — ABNORMAL LOW (ref 98–111)
Creatinine, Ser: 1.19 mg/dL (ref 0.61–1.24)
GFR calc non Af Amer: >60 mL/min (ref >60)
Glucose, Bld: 213 mg/dL — ABNORMAL HIGH (ref 70–99)
Potassium: 4.4 mmol/L (ref 3.5–5.1)
Sodium: 137 mmol/L (ref 135–145)

## 2018-08-29 LAB — GLUCOSE, CAPILLARY
Glucose-Capillary: 198 mg/dL — ABNORMAL HIGH (ref 70–99)
Glucose-Capillary: 234 mg/dL — ABNORMAL HIGH (ref 70–99)
Glucose-Capillary: 229 mg/dL — ABNORMAL HIGH (ref 70–99)

## 2018-08-29 LAB — CBC
HEMATOCRIT: 42.7 % (ref 39.0–52.0)
Hemoglobin: 14.1 g/dL (ref 13.0–17.0)
MCH: 31.1 pg (ref 26.0–34.0)
MCHC: 33 g/dL (ref 30.0–36.0)
MCV: 94.1 fL (ref 80.0–100.0)
Platelets: 214 10*3/uL (ref 150–400)
RBC: 4.54 MIL/uL (ref 4.22–5.81)
RDW: 12.7 % (ref 11.5–15.5)
WBC: 9.6 10*3/uL (ref 4.0–10.5)
nRBC: 0 % (ref 0.0–0.2)

## 2018-08-29 MED ORDER — GUAIFENESIN ER 600 MG PO TB12: 1200.0000 mg | ORAL_TABLET | Freq: Two times a day (BID) | ORAL | 0 refills | Status: AC

## 2018-08-29 MED ORDER — FUROSEMIDE 40 MG PO TABS: 40.0000 mg | ORAL_TABLET | Freq: Every day | ORAL | Status: DC

## 2018-08-29 MED ORDER — HYDROCODONE-HOMATROPINE 5-1.5 MG/5ML PO SYRP: 5.0000 mL | ORAL_SOLUTION | Freq: Four times a day (QID) | ORAL | 0 refills | Status: DC | PRN

## 2018-08-29 MED ORDER — ALBUTEROL SULFATE HFA 108 (90 BASE) MCG/ACT IN AERS: 2.0000 | INHALATION_SPRAY | Freq: Three times a day (TID) | RESPIRATORY_TRACT | 2 refills | Status: DC

## 2018-08-29 MED ORDER — ATORVASTATIN CALCIUM 10 MG PO TABS: 10.0000 mg | ORAL_TABLET | Freq: Every day | ORAL | 1 refills | Status: DC

## 2018-08-29 MED ORDER — BUDESONIDE-FORMOTEROL FUMARATE 160-4.5 MCG/ACT IN AERO: 2.0000 | INHALATION_SPRAY | Freq: Two times a day (BID) | RESPIRATORY_TRACT | 0 refills | Status: DC

## 2018-08-29 MED ORDER — FUROSEMIDE 40 MG PO TABS: 40.0000 mg | ORAL_TABLET | Freq: Every day | ORAL | 1 refills | Status: DC

## 2018-08-29 MED ORDER — AZITHROMYCIN 500 MG PO TABS: 500.0000 mg | ORAL_TABLET | Freq: Every day | ORAL | 0 refills | Status: AC

## 2018-08-29 MED ORDER — PANTOPRAZOLE SODIUM 40 MG PO TBEC: 40.0000 mg | DELAYED_RELEASE_TABLET | Freq: Every day | ORAL | 1 refills | Status: DC

## 2018-08-29 MED ORDER — FLUTICASONE PROPIONATE 50 MCG/ACT NA SUSP: 2.0000 | Freq: Every day | NASAL | 0 refills | Status: DC

## 2018-08-29 MED ORDER — PREDNISONE 20 MG PO TABS: 20.0000 mg | ORAL_TABLET | Freq: Every day | ORAL | 0 refills | Status: DC

## 2018-08-29 MED ORDER — LISINOPRIL 2.5 MG PO TABS: 2.5000 mg | ORAL_TABLET | Freq: Every day | ORAL | 1 refills | Status: DC

## 2018-08-29 NOTE — Progress Notes (Signed)
Physical Therapy Treatment Patient Details Name: Ronnie Hernandez MRN: [id number] DOB: 1960/10/20 Today's Date: 08/29/2018    History of Present Illness Pt is a 58 y/o male admitted 08/26/2018 with SOB; worked up for acute respiratory failure, likely combination of bronchitis and CHF. PMH includes diastolic dysfunction, HTN, gout, chronic anemia, morbid obesity.    PT Comments    Pt progressing well with mobility. Ambulatory at [facility]; able to increase ambulation distance, DOE 3/4 upon sitting post-walk requiring increased time to recover. SpO2 94-100% on RA. Educ on energy conservation strategies with mobility. Pt remains motivated to return home.    Follow Up Recommendations  Home health PT;Supervision - Intermittent     Equipment Recommendations  Wheelchair cushion (measurements PT);Wheelchair (measurements PT)(bariatric-sized wheelchair, rollator, 3in1)    Recommendations for Other Services       Precautions / Restrictions Precautions Precautions: Fall Restrictions Weight Bearing Restrictions: No    Mobility  Bed Mobility               General bed mobility comments: up in recliner  Transfers Overall transfer level: Modified independent Equipment used: Quad cane Transfers: Sit to/from Stand Sit to Stand: Modified independent (Device/Increase time)         General transfer comment: Reliant on momentum to power into standing; mod indep  Ambulation/Gait Ambulation/Gait assistance: Supervision Gait Distance (Feet): 80 Feet Assistive device: Quad cane Gait Pattern/deviations: Step-through pattern;Wide base of support Gait velocity: Decreased Gait velocity interpretation: <1.8 ft/sec, indicate of risk for recurrent falls General Gait Details: Slow, waddling-type gait due to body habitus; no overt instability or LOB, supervision for safety. Pt not significantly relying on quad cane for balance, with only one prong consistently touching ground. Pt not  interested in attempting to correct gait mechanics. DOE 3/4 upon sitting post-amb   Stairs             Wheelchair Mobility    Modified Rankin (Stroke Patients Only)       Balance Overall balance assessment: Needs assistance Sitting-balance support: No upper extremity supported;Feet supported Sitting balance-Leahy Scale: Fair     Standing balance support: Single extremity supported;During functional activity Standing balance-Leahy Scale: Fair Standing balance comment: Can amb without UE support                            Cognition Arousal/Alertness: Awake/alert Behavior During Therapy: WFL for tasks assessed/performed Overall Cognitive Status: Within Functional Limits for tasks assessed                                        Exercises      General Comments General comments (skin integrity, edema, etc.): SpO2 94-100% on RA; review incentive spirometry use      Pertinent Vitals/Pain Pain Assessment: No/denies pain    Home Living                      Prior Function            PT Goals (current goals can now be found in the care plan section) Acute Rehab PT Goals Patient Stated Goal: improve strength and mobility PT Goal Formulation: With patient Time For Goal Achievement: 09/10/18 Potential to Achieve Goals: Good Progress towards PT goals: Progressing toward goals    Frequency    Min 3X/week      PT Plan  Current plan remains appropriate    Co-evaluation              AM-PAC PT "6 Clicks" Mobility   Outcome Measure  Help needed turning from your back to your side while in a flat bed without using bedrails?: A Little Help needed moving from lying on your back to sitting on the side of a flat bed without using bedrails?: A Little Help needed moving to and from a bed to a chair (including a wheelchair)?: None Help needed standing up from a chair using your arms (e.g., wheelchair or bedside chair)?:  None Help needed to walk in hospital room?: A Little Help needed climbing 3-5 steps with a railing? : A Lot 6 Click Score: 19    End of Session   Activity Tolerance: Patient tolerated treatment well Patient left: in chair;with call bell/phone within reach Nurse Communication: Mobility status PT Visit Diagnosis: Unsteadiness on feet (R26.81);Other abnormalities of gait and mobility (R26.89);Muscle weakness (generalized) (M62.81)     Time: 5885-0277 PT Time Calculation (min) (ACUTE ONLY): 16 min  Charges:  $Gait Training: 8-22 mins                    Mabeline Caras, PT, DPT Acute Rehabilitation Services  Pager [phone] Office [place] 08/29/2018, 9:30 AM

## 2018-08-29 NOTE — Discharge Summary (Signed)
Physician Discharge Summary  Ronnie Hernandez HYQ:[id number] DOB: Mar 02, 1961 DOA: 08/26/2018  PCP: [name], NP  Admit date: 08/26/2018 Discharge date: 08/29/2018  Time spent: 55 minutes  Recommendations for Outpatient Follow-up:  1. Follow-up with [name], NP in 2 weeks.  On follow-up patient needs a basic metabolic profile done to follow-up on electrolytes and renal function.  Patient's volume status will need to be assessed.  Patient may benefit from outpatient referral to cardiology for further management of his diastolic heart failure.   Discharge Diagnoses:  Principal Problem:   Acute respiratory failure with hypoxia (HCC) Active Problems:   Acute diastolic CHF (congestive heart failure) ([place])   Hyperlipidemia   Morbid obesity (HCC)   Hypertension   Type 2 diabetes mellitus ([place])   Drug-induced gout   Moderate persistent asthma with exacerbation   Discharge Condition: Stable and improved  Diet recommendation: Heart healthy  Filed Weights   08/27/18 0549 08/28/18 0544 08/29/18 0617  Weight: (!) 207.9 kg (!) 210.7 kg (!) 195.9 kg    History of present illness:  Per [name] is a 58 y.o. male with history of diastolic dysfunction last 2D echo done in February 2018 showed EF of 60 to 65% with grade 3 diastolic dysfunction, hypertension, gout, chronic anemia and morbid obesity presented to the ER because of worsening shortness of breath.  Patient stated over last few days patient has been getting increasingly short of breath with increasing lower extremity edema and has been wheezing.  At times has chest pain pressure-like.  Because of worsening shortness of breath patient presented to the ER.  Patient stated he had been compliant with his Lasix however on further questioning it is noted that patient has not had his diuretics in several weeks..  ED Course: In the ER patient was found to be hypoxic and wheezing chest x-ray shows  cardiomegaly BNP was normal.  On exam patient looks edematous.  Was given Lasix 40 mg IV and nebulizer treatment admitted for acute respiratory failure with hypoxia likely from worsening CHF but could be also contributed by bronchitis since patient is wheezing.  EKG shows sinus tachycardia.  Hemoglobin 12.4 WBC count 6.2.   Hospital Course:  1 acute respiratory failure with hypoxia secondary to probable acute asthma exacerbation with bronchitis and acute diastolic CHF. Likely multifactorial secondary to acute diastolic CHF exacerbation, probable bronchitis.  Last 2D echo in 2018 with a EF of 60 to 65% with grade 3 diastolic dysfunction.  Patient was placed on IV diuretics, IV steroid taper, Pulmicort, Flonase, Mucinex, Xopenex nebs, azithromycin, Hycodan as needed.  Patient diuresed and was -14.10 L during this hospitalization.  Respiratory viral panel negative. 2D echo done this hospitalization with a EF of 60 to 65%, mildly increased left ventricular wall thickness, indeterminate diastolic function, unable to evaluate focal wall motion. Patient denied any ongoing chest pain.    Patient improved clinically and patient be discharged home on 5 days of oral azithromycin, steroid taper, 40 mg of Lasix daily, albuterol MDIs, Mucinex.  Outpatient follow-up with PCP.  Patient may benefit from outpatient referral to cardiology.   2.  Acute diastolic CHF exacerbation Patient presented with worsening shortness of breath, noted to have some diffuse wheezing on examination.  Patient also with some lower extremity edema.  Acute CHF exacerbation likely secondary to dietary indiscretion and probable medication noncompliance as patient stated had been out of his diuretics for several weeks. 2D echo done during this hospitalization with  a EF of 60 to 65%, mildly increased left ventricular wall thickness, indeterminate diastolic function, unable to evaluate focal wall motion.  Patient placed on IV diuretics with good  diuresis and clinical improvement. Patient 14.106 L during the hospitalization.  Lower extremity Dopplers which were done were negative.  Patient was maintained on IV Lasix and subsequently transitioned to oral Lasix 40 mg daily on discharge.  Patient maintained on home regimen of Lipitor and lisinopril.  Outpatient follow-up with PCP.  Patient was also seen by dietitian for dietary education.  3.  Hypertension Patient maintained on home regimen of lisinopril.  Patient was also on diuretics during the hospitalization.  Blood pressure remained stable.    4.  History of diabetes  per chart not on antidiabetic medication.  Last hemoglobin A1c was 5.8.  Repeat hemoglobin A1c 6.1.    Outpatient follow-up.   5.  History of gout Patient maintained on home regimen of allopurinol.   6.  Morbid obesity  7.  Hyperlipidemia Patient maintained on a statin.    Procedures:  2D echo 08/26/2018  Chest x-ray 08/26/2018  Lower extremity Dopplers 08/26/2018  Consultations:  None  Discharge Exam: Vitals:   08/29/18 0800 08/29/18 1135  BP: (!) 141/73 126/72  Pulse: 85 78  Resp:  20  Temp: 98.2 F (36.8 C) (!) 97.4 F (36.3 C)  SpO2: 95% 94%    General: NAD Cardiovascular: RRR Respiratory: Clear to auscultation bilaterally.  Discharge Instructions   Discharge Instructions    Diet - low sodium heart healthy   Complete by:  As directed    Increase activity slowly   Complete by:  As directed      Allergies as of 08/29/2018      Reactions   Uloric [febuxostat] Other (See Comments)   Made the skin on feet and legs "hurt"      Medication List    TAKE these medications   acetaminophen 500 MG tablet Commonly known as:  TYLENOL Take 1,000 mg by mouth every 4 (four) hours as needed (pain).   albuterol 108 (90 Base) MCG/ACT inhaler Commonly known as:  PROVENTIL HFA;VENTOLIN HFA Inhale 2 puffs into the lungs 3 (three) times daily. Use 2 puffs 3 times daily x5 days, then every 6  hours as needed.   allopurinol 300 MG tablet Commonly known as:  ZYLOPRIM Take 1 tablet (300 mg total) by mouth 2 (two) times daily. What changed:    when to take this  reasons to take this   atorvastatin 10 MG tablet Commonly known as:  LIPITOR Take 1 tablet (10 mg total) by mouth daily at 6 PM.   azithromycin 500 MG tablet Commonly known as:  ZITHROMAX Take 1 tablet (500 mg total) by mouth daily for 5 days. Start taking on:  August 30, 2018   budesonide-formoterol 160-4.5 MCG/ACT inhaler Commonly known as:  Symbicort Inhale 2 puffs into the lungs 2 (two) times daily.   colchicine 0.6 MG tablet Take 1 tablet (0.6 mg total) by mouth daily. What changed:    when to take this  reasons to take this   diclofenac sodium 1 % Gel Commonly known as:  VOLTAREN Apply 2 g topically 4 (four) times daily. What changed:    when to take this  reasons to take this   fluticasone 50 MCG/ACT nasal spray Commonly known as:  FLONASE Place 2 sprays into both nostrils daily. Start taking on:  August 30, 2018   furosemide 40 MG tablet Commonly known  as:  LASIX Take 1 tablet (40 mg total) by mouth daily. What changed:  medication strength   gentamicin cream 0.1 % Commonly known as:  GARAMYCIN Apply 1 application topically 2 (two) times daily. What changed:    when to take this  additional instructions   guaiFENesin 600 MG 12 hr tablet Commonly known as:  MUCINEX Take 2 tablets (1,200 mg total) by mouth 2 (two) times daily for 5 days.   HYDROcodone-homatropine 5-1.5 MG/5ML syrup Commonly known as:  HYCODAN Take 5 mLs by mouth every 6 (six) hours as needed for cough.   lisinopril 2.5 MG tablet Commonly known as:  PRINIVIL,ZESTRIL Take 1 tablet (2.5 mg total) by mouth daily.   multivitamin with minerals Tabs tablet Take 1 tablet by mouth daily. One a Day Men's   pantoprazole 40 MG tablet Commonly known as:  PROTONIX Take 1 tablet (40 mg total) by mouth daily at 6 (six)  AM. Start taking on:  August 30, 2018   predniSONE 20 MG tablet Commonly known as:  DELTASONE Take 1-3 tablets (20-60 mg total) by mouth daily with breakfast. Take 3 tablets (60mg ) daily x2 days, then 2 tablets (40mg ) daily x3 days, then 1 tablet (20mg ) daily x3 days then stop. Start taking on:  August 30, 2018            Durable Medical Equipment  (From admission, onward)         Start     Ordered   08/29/18 3016  For home use only DME 3 n 1  Once    Comments:  BARIATRIC   08/29/18 0811   08/29/18 0811  For home use only DME wheelchair cushion (seat and back)  Once     08/29/18 0811   08/29/18 0811  For home use only DME high strength lightweight manual wheelchair with seat cushion  Once    Comments:  Patient suffers from debility/morbid obesity which impairs their ability to perform daily activities in the home.  A walking aid will not resolve  issue with performing activities of daily living. A wheelchair will allow patient to safely perform daily activities.  Accessories: elevating leg rests (ELRs), wheel locks, extensions and anti-tippers.  NEEDS BARIATRIC CHAIR   08/29/18 0811         Allergies  Allergen Reactions  . Uloric [Febuxostat] Other (See Comments)    Made the skin on feet and legs "hurt"   Follow-up Information    [facility] Follow up.   Why:  They will do your home health care at your home Contact information: [address] [place], [place] [address] tele # [phone]       [name], NP. Schedule an appointment as soon as possible for a visit in 2 week(s).   Specialty:  Geriatric Medicine Contact information: [place] [place] [address] [phone]            The results of significant diagnostics from this hospitalization (including imaging, microbiology, ancillary and laboratory) are listed below for reference.    Significant Diagnostic Studies: Dg Chest 2 View  Result Date:  08/26/2018 CLINICAL DATA:  Wheezing, cough, chest congestion. Progressive symptoms over 2 weeks. EXAM: CHEST - 2 VIEW COMPARISON:  11/15/2017 FINDINGS: Cardiomegaly is unchanged from prior exam. Mild peribronchial thickening. No focal airspace disease, pleural effusion, or pneumothorax. Degenerative change in the spine. Soft tissue attenuation from body habitus limits detailed assessment. IMPRESSION: Chronic cardiomegaly. Mild peribronchial thickening. Electronically Signed  By: Keith Rake M.D.   On: 08/26/2018 01:41   Vas [place] Lower Extremity Venous (dvt)  Result Date: 08/26/2018  Lower Venous Study Indications: Edema.  Limitations: Body habitus and poor ultrasound/tissue interface. Comparison Study: Prior study on 03/31/18 is available for comparison Performing Technologist: Sharion Dove RVS Supporting Technologist: Maudry Mayhew RDMS, RVT, RDCS  Examination Guidelines: A complete evaluation includes B-mode imaging, spectral Doppler, color Doppler, and power Doppler as needed of all accessible portions of each vessel. Bilateral testing is considered an integral part of a complete examination. Limited examinations for reoccurring indications may be performed as noted.  Right Venous Findings: +---------+---------------+---------+-----------+----------+----------+          CompressibilityPhasicitySpontaneityPropertiesSummary    +---------+---------------+---------+-----------+----------+----------+ CFV                                                   visualized +---------+---------------+---------+-----------+----------+----------+ FV Prox                                               visualized +---------+---------------+---------+-----------+----------+----------+ FV Mid                                                visualized +---------+---------------+---------+-----------+----------+----------+ FV Distal                                             visualized  +---------+---------------+---------+-----------+----------+----------+ POP      Full                                         visualized +---------+---------------+---------+-----------+----------+----------+ PTV                                                   visualized +---------+---------------+---------+-----------+----------+----------+ PERO                                                  visualized +---------+---------------+---------+-----------+----------+----------+  Right Technical Findings: Not visualized segments include profunda vein.  Left Venous Findings: +---------+---------------+---------+-----------+----------+----------+          CompressibilityPhasicitySpontaneityPropertiesSummary    +---------+---------------+---------+-----------+----------+----------+ CFV      Full           Yes      Yes                             +---------+---------------+---------+-----------+----------+----------+ FV Prox  Full                                                    +---------+---------------+---------+-----------+----------+----------+  FV Mid                                                visualized +---------+---------------+---------+-----------+----------+----------+ FV Distal                                             visualized +---------+---------------+---------+-----------+----------+----------+ POP                                                   visualized +---------+---------------+---------+-----------+----------+----------+ PTV                                                   visualized +---------+---------------+---------+-----------+----------+----------+  Left Technical Findings: Not visualized segments include profunda, peroneal.   Summary: Right: There is no evidence of deep vein thrombosis in the lower extremity. However, portions of this examination were limited- see technologist comments above. Left: There is no  evidence of deep vein thrombosis in the lower extremity. However, portions of this examination were limited- see technologist comments above.  *See table(s) above for measurements and observations. Electronically signed by Monica Martinez MD on 08/26/2018 at 4:29:33 PM.    Final     Microbiology: Recent Results (from the past 240 hour(s))  Respiratory Panel by PCR     Status: None   Collection Time: 08/26/18  6:56 AM  Result Value Ref Range Status   Adenovirus NOT DETECTED NOT DETECTED Final   Coronavirus 229E NOT DETECTED NOT DETECTED Final    Comment: (NOTE) The Coronavirus on the Respiratory Panel, DOES NOT test for the novel  Coronavirus (2019 nCoV)    Coronavirus HKU1 NOT DETECTED NOT DETECTED Final   Coronavirus NL63 NOT DETECTED NOT DETECTED Final   Coronavirus OC43 NOT DETECTED NOT DETECTED Final   Metapneumovirus NOT DETECTED NOT DETECTED Final   Rhinovirus / Enterovirus NOT DETECTED NOT DETECTED Final   Influenza A NOT DETECTED NOT DETECTED Final   Influenza B NOT DETECTED NOT DETECTED Final   Parainfluenza Virus 1 NOT DETECTED NOT DETECTED Final   Parainfluenza Virus 2 NOT DETECTED NOT DETECTED Final   Parainfluenza Virus 3 NOT DETECTED NOT DETECTED Final   Parainfluenza Virus 4 NOT DETECTED NOT DETECTED Final   Respiratory Syncytial Virus NOT DETECTED NOT DETECTED Final   Bordetella pertussis NOT DETECTED NOT DETECTED Final   Chlamydophila pneumoniae NOT DETECTED NOT DETECTED Final   Mycoplasma pneumoniae NOT DETECTED NOT DETECTED Final    Comment: Performed at [facility], [place] [address], [place], [place] [address]     Labs: Basic Metabolic Panel: Recent Labs  Lab 08/26/18 0110 08/26/18 0612 08/27/18 0714 08/28/18 0521 08/29/18 0457  NA 135 136 138 137 137  K 3.7 3.7 4.2 4.5 4.4  CL 100 99 102 98 97*  CO2 22 24 28 26 28   GLUCOSE 144* 193* 182* 202* 213*  BUN 17 18 25* 32* 40*  CREATININE 1.10 1.13 1.24 1.18 1.19  CALCIUM 8.4* 8.8* 8.7* 8.6*  8.6*   MG  --  2.1 2.3  --   --    Liver Function Tests: Recent Labs  Lab 08/26/18 0110 08/26/18 0612  AST 25 28  ALT 20 23  ALKPHOS 110 120  BILITOT 0.7 0.8  PROT 7.2 7.7  ALBUMIN 3.1* 3.2*   No results for input(s): LIPASE, AMYLASE in the last 168 hours. No results for input(s): AMMONIA in the last 168 hours. CBC: Recent Labs  Lab 08/26/18 0110 08/26/18 0612 08/27/18 0714 08/28/18 0521 08/29/18 0457  WBC 6.2 6.1 8.7 7.3 9.6  NEUTROABS 3.5 5.2 6.2 6.6  --   HGB 12.4* 13.5 13.4 14.2 14.1  HCT 39.8 42.3 41.1 44.4 42.7  MCV 97.1 94.8 95.1 94.9 94.1  PLT 174 168 198 197 214   Cardiac Enzymes: Recent Labs  Lab 08/26/18 0110  TROPONINI <0.03   BNP: BNP (last 3 results) Recent Labs    11/15/17 1349 03/31/18 1230 08/26/18 0110  BNP 51.7 18.1 21.9    ProBNP (last 3 results) No results for input(s): PROBNP in the last 8760 hours.  CBG: Recent Labs  Lab 08/28/18 1240 08/28/18 1636 08/28/18 2113 08/29/18 0754 08/29/18 1133  GLUCAP 205* 203* 232* 198* 234*       Signed:  Irine Seal MD.  [facility] 08/29/2018, 2:56 PM

## 2018-08-29 NOTE — Clinical Social Work Note (Signed)
Left message for Ronnie Hernandez rep to let him know patient is discharging today. Have called [employer] regarding equipment. Patient will be unable to get a new wheelchair since he just got one in 2018. Cannot get a new until 5-year mark. Patient requesting cab voucher.  [place]ton Scrape, [place]

## 2018-08-30 ENCOUNTER — Telehealth: Payer: Self-pay | Admitting: *Deleted

## 2018-08-30 DIAGNOSIS — E785 Hyperlipidemia, unspecified: Secondary | ICD-10-CM | POA: Diagnosis not present

## 2018-08-30 DIAGNOSIS — J9601 Acute respiratory failure with hypoxia: Secondary | ICD-10-CM | POA: Diagnosis not present

## 2018-08-30 DIAGNOSIS — D649 Anemia, unspecified: Secondary | ICD-10-CM | POA: Diagnosis not present

## 2018-08-30 DIAGNOSIS — M1990 Unspecified osteoarthritis, unspecified site: Secondary | ICD-10-CM | POA: Diagnosis not present

## 2018-08-30 DIAGNOSIS — E119 Type 2 diabetes mellitus without complications: Secondary | ICD-10-CM | POA: Diagnosis not present

## 2018-08-30 DIAGNOSIS — J4541 Moderate persistent asthma with (acute) exacerbation: Secondary | ICD-10-CM | POA: Diagnosis not present

## 2018-08-30 DIAGNOSIS — I5031 Acute diastolic (congestive) heart failure: Secondary | ICD-10-CM | POA: Diagnosis not present

## 2018-08-30 DIAGNOSIS — I110 Hypertensive heart disease with heart failure: Secondary | ICD-10-CM | POA: Diagnosis not present

## 2018-08-30 DIAGNOSIS — M1A29X0 Drug-induced chronic gout, multiple sites, without tophus (tophi): Secondary | ICD-10-CM | POA: Diagnosis not present

## 2018-08-30 NOTE — Telephone Encounter (Signed)
Thank you :)

## 2018-08-30 NOTE — Telephone Encounter (Signed)
I have made the 2nd attempt to contact the patient or family member in charge, in order to follow up from recently being discharged from the hospital. Patient's numbers in system are Temporally not in Service, I will make another attempt at a different time.

## 2018-08-30 NOTE — Telephone Encounter (Signed)
I have made the 1st attempt to contact the patient or family member in charge, in order to follow up from recently being discharged from the hospital.  Patient's numbers in system are Temporally not in Service.  Will try again later.

## 2018-08-31 DIAGNOSIS — I5031 Acute diastolic (congestive) heart failure: Secondary | ICD-10-CM | POA: Diagnosis not present

## 2018-08-31 DIAGNOSIS — D649 Anemia, unspecified: Secondary | ICD-10-CM | POA: Diagnosis not present

## 2018-08-31 DIAGNOSIS — E785 Hyperlipidemia, unspecified: Secondary | ICD-10-CM | POA: Diagnosis not present

## 2018-08-31 DIAGNOSIS — I110 Hypertensive heart disease with heart failure: Secondary | ICD-10-CM | POA: Diagnosis not present

## 2018-08-31 DIAGNOSIS — J4541 Moderate persistent asthma with (acute) exacerbation: Secondary | ICD-10-CM | POA: Diagnosis not present

## 2018-08-31 DIAGNOSIS — E119 Type 2 diabetes mellitus without complications: Secondary | ICD-10-CM | POA: Diagnosis not present

## 2018-08-31 DIAGNOSIS — J9601 Acute respiratory failure with hypoxia: Secondary | ICD-10-CM | POA: Diagnosis not present

## 2018-08-31 DIAGNOSIS — M1A29X0 Drug-induced chronic gout, multiple sites, without tophus (tophi): Secondary | ICD-10-CM | POA: Diagnosis not present

## 2018-08-31 DIAGNOSIS — M1990 Unspecified osteoarthritis, unspecified site: Secondary | ICD-10-CM | POA: Diagnosis not present

## 2018-08-31 NOTE — Telephone Encounter (Signed)
I have made the 3rd attempt to contact the patient or family member in charge, in order to follow up from recently being discharged from the hospital. Patient's numbers in system are Temporally not in Service.

## 2018-09-05 DIAGNOSIS — I110 Hypertensive heart disease with heart failure: Secondary | ICD-10-CM | POA: Diagnosis not present

## 2018-09-05 DIAGNOSIS — M1A29X0 Drug-induced chronic gout, multiple sites, without tophus (tophi): Secondary | ICD-10-CM | POA: Diagnosis not present

## 2018-09-05 DIAGNOSIS — M1990 Unspecified osteoarthritis, unspecified site: Secondary | ICD-10-CM | POA: Diagnosis not present

## 2018-09-05 DIAGNOSIS — E119 Type 2 diabetes mellitus without complications: Secondary | ICD-10-CM | POA: Diagnosis not present

## 2018-09-05 DIAGNOSIS — D649 Anemia, unspecified: Secondary | ICD-10-CM | POA: Diagnosis not present

## 2018-09-05 DIAGNOSIS — I5031 Acute diastolic (congestive) heart failure: Secondary | ICD-10-CM | POA: Diagnosis not present

## 2018-09-05 DIAGNOSIS — E785 Hyperlipidemia, unspecified: Secondary | ICD-10-CM | POA: Diagnosis not present

## 2018-09-05 DIAGNOSIS — J9601 Acute respiratory failure with hypoxia: Secondary | ICD-10-CM | POA: Diagnosis not present

## 2018-09-05 DIAGNOSIS — J4541 Moderate persistent asthma with (acute) exacerbation: Secondary | ICD-10-CM | POA: Diagnosis not present

## 2018-09-12 ENCOUNTER — Other Ambulatory Visit: Payer: Self-pay | Admitting: Nurse Practitioner

## 2018-09-12 ENCOUNTER — Telehealth: Payer: Self-pay | Admitting: *Deleted

## 2018-09-12 DIAGNOSIS — E119 Type 2 diabetes mellitus without complications: Secondary | ICD-10-CM | POA: Diagnosis not present

## 2018-09-12 DIAGNOSIS — E785 Hyperlipidemia, unspecified: Secondary | ICD-10-CM | POA: Diagnosis not present

## 2018-09-12 DIAGNOSIS — I5031 Acute diastolic (congestive) heart failure: Secondary | ICD-10-CM | POA: Diagnosis not present

## 2018-09-12 DIAGNOSIS — M1A29X0 Drug-induced chronic gout, multiple sites, without tophus (tophi): Secondary | ICD-10-CM | POA: Diagnosis not present

## 2018-09-12 DIAGNOSIS — J9601 Acute respiratory failure with hypoxia: Secondary | ICD-10-CM | POA: Diagnosis not present

## 2018-09-12 DIAGNOSIS — R600 Localized edema: Secondary | ICD-10-CM

## 2018-09-12 DIAGNOSIS — D649 Anemia, unspecified: Secondary | ICD-10-CM | POA: Diagnosis not present

## 2018-09-12 DIAGNOSIS — M1990 Unspecified osteoarthritis, unspecified site: Secondary | ICD-10-CM | POA: Diagnosis not present

## 2018-09-12 DIAGNOSIS — J4541 Moderate persistent asthma with (acute) exacerbation: Secondary | ICD-10-CM | POA: Diagnosis not present

## 2018-09-12 DIAGNOSIS — I110 Hypertensive heart disease with heart failure: Secondary | ICD-10-CM | POA: Diagnosis not present

## 2018-09-12 MED ORDER — FUROSEMIDE 40 MG PO TABS: 40.0000 mg | ORAL_TABLET | Freq: Every day | ORAL | 1 refills | Status: DC

## 2018-09-12 NOTE — Telephone Encounter (Signed)
Kiona with [facility] called and stated that patient is complaining of Cramps in hands and legs. Stated he DOES NOT take Potassium but is on Furosemide. Nurse is wondering if he should also be taking Potassium.  Also needs a refill on his Furosemide.  Please Advise.

## 2018-09-12 NOTE — Telephone Encounter (Signed)
He has been on lasix chronically without potassium. Last potassium checked 2 weeks ago was normal. Okay to provide refill

## 2018-09-12 NOTE — Telephone Encounter (Signed)
Ronnie Hernandez with [facility] notified and agreed.  Rx faxed to pharmacy for Furosemide.

## 2018-09-15 DIAGNOSIS — M1A29X0 Drug-induced chronic gout, multiple sites, without tophus (tophi): Secondary | ICD-10-CM | POA: Diagnosis not present

## 2018-09-15 DIAGNOSIS — E785 Hyperlipidemia, unspecified: Secondary | ICD-10-CM | POA: Diagnosis not present

## 2018-09-15 DIAGNOSIS — M1990 Unspecified osteoarthritis, unspecified site: Secondary | ICD-10-CM | POA: Diagnosis not present

## 2018-09-15 DIAGNOSIS — E119 Type 2 diabetes mellitus without complications: Secondary | ICD-10-CM | POA: Diagnosis not present

## 2018-09-15 DIAGNOSIS — I110 Hypertensive heart disease with heart failure: Secondary | ICD-10-CM | POA: Diagnosis not present

## 2018-09-15 DIAGNOSIS — I5031 Acute diastolic (congestive) heart failure: Secondary | ICD-10-CM | POA: Diagnosis not present

## 2018-09-15 DIAGNOSIS — J9601 Acute respiratory failure with hypoxia: Secondary | ICD-10-CM | POA: Diagnosis not present

## 2018-09-15 DIAGNOSIS — J4541 Moderate persistent asthma with (acute) exacerbation: Secondary | ICD-10-CM | POA: Diagnosis not present

## 2018-09-15 DIAGNOSIS — D649 Anemia, unspecified: Secondary | ICD-10-CM | POA: Diagnosis not present

## 2018-09-19 DIAGNOSIS — E785 Hyperlipidemia, unspecified: Secondary | ICD-10-CM | POA: Diagnosis not present

## 2018-09-19 DIAGNOSIS — M1A29X0 Drug-induced chronic gout, multiple sites, without tophus (tophi): Secondary | ICD-10-CM | POA: Diagnosis not present

## 2018-09-19 DIAGNOSIS — J9601 Acute respiratory failure with hypoxia: Secondary | ICD-10-CM | POA: Diagnosis not present

## 2018-09-19 DIAGNOSIS — D649 Anemia, unspecified: Secondary | ICD-10-CM | POA: Diagnosis not present

## 2018-09-19 DIAGNOSIS — I110 Hypertensive heart disease with heart failure: Secondary | ICD-10-CM | POA: Diagnosis not present

## 2018-09-19 DIAGNOSIS — J4541 Moderate persistent asthma with (acute) exacerbation: Secondary | ICD-10-CM | POA: Diagnosis not present

## 2018-09-19 DIAGNOSIS — M1990 Unspecified osteoarthritis, unspecified site: Secondary | ICD-10-CM | POA: Diagnosis not present

## 2018-09-19 DIAGNOSIS — I5031 Acute diastolic (congestive) heart failure: Secondary | ICD-10-CM | POA: Diagnosis not present

## 2018-09-19 DIAGNOSIS — E119 Type 2 diabetes mellitus without complications: Secondary | ICD-10-CM | POA: Diagnosis not present

## 2018-09-20 ENCOUNTER — Encounter: Payer: Self-pay | Admitting: Family

## 2018-09-20 ENCOUNTER — Ambulatory Visit: Payer: Self-pay

## 2018-09-25 DIAGNOSIS — M1990 Unspecified osteoarthritis, unspecified site: Secondary | ICD-10-CM | POA: Diagnosis not present

## 2018-09-25 DIAGNOSIS — M1A29X0 Drug-induced chronic gout, multiple sites, without tophus (tophi): Secondary | ICD-10-CM | POA: Diagnosis not present

## 2018-09-25 DIAGNOSIS — I110 Hypertensive heart disease with heart failure: Secondary | ICD-10-CM | POA: Diagnosis not present

## 2018-09-25 DIAGNOSIS — J4541 Moderate persistent asthma with (acute) exacerbation: Secondary | ICD-10-CM | POA: Diagnosis not present

## 2018-09-25 DIAGNOSIS — D649 Anemia, unspecified: Secondary | ICD-10-CM | POA: Diagnosis not present

## 2018-09-25 DIAGNOSIS — J9601 Acute respiratory failure with hypoxia: Secondary | ICD-10-CM | POA: Diagnosis not present

## 2018-09-25 DIAGNOSIS — E119 Type 2 diabetes mellitus without complications: Secondary | ICD-10-CM | POA: Diagnosis not present

## 2018-09-25 DIAGNOSIS — I5031 Acute diastolic (congestive) heart failure: Secondary | ICD-10-CM | POA: Diagnosis not present

## 2018-09-25 DIAGNOSIS — E785 Hyperlipidemia, unspecified: Secondary | ICD-10-CM | POA: Diagnosis not present

## 2018-10-25 ENCOUNTER — Ambulatory Visit: Payer: Medicare HMO | Admitting: Podiatry

## 2018-11-14 ENCOUNTER — Telehealth: Payer: Self-pay | Admitting: Nurse Practitioner

## 2018-11-14 NOTE — Telephone Encounter (Signed)
Patient stated that he will have the company refax the form.   Informed patient that he needed a follow up appointment and he stated that he will call and come in next month but he would need the shoes to wear to the appointment because he does not have any.

## 2018-11-15 NOTE — Telephone Encounter (Signed)
Form Received from [facility] [phone] Fax: [phone] Placed in [place] folder to review and sign.

## 2018-11-16 NOTE — Telephone Encounter (Signed)
Called [employer] and form has to be signed by a MD or DO.   Form placed in Dr. Cyndi Lennert folder to review and sign.

## 2018-12-04 DIAGNOSIS — E1165 Type 2 diabetes mellitus with hyperglycemia: Secondary | ICD-10-CM | POA: Diagnosis not present

## 2019-01-24 ENCOUNTER — Telehealth: Payer: Self-pay | Admitting: Nurse Practitioner

## 2019-01-29 ENCOUNTER — Ambulatory Visit (INDEPENDENT_AMBULATORY_CARE_PROVIDER_SITE_OTHER): Payer: Medicare HMO | Admitting: Nurse Practitioner

## 2019-01-29 ENCOUNTER — Other Ambulatory Visit: Payer: Self-pay

## 2019-01-29 ENCOUNTER — Encounter: Payer: Self-pay | Admitting: Nurse Practitioner

## 2019-01-29 VITALS — BP 142/80 | HR 105 | Temp 98.3°F | Resp 12 | Wt >= 6400 oz

## 2019-01-29 DIAGNOSIS — E1169 Type 2 diabetes mellitus with other specified complication: Secondary | ICD-10-CM

## 2019-01-29 DIAGNOSIS — M1A9XX1 Chronic gout, unspecified, with tophus (tophi): Secondary | ICD-10-CM | POA: Diagnosis not present

## 2019-01-29 DIAGNOSIS — E785 Hyperlipidemia, unspecified: Secondary | ICD-10-CM

## 2019-01-29 DIAGNOSIS — I5032 Chronic diastolic (congestive) heart failure: Secondary | ICD-10-CM | POA: Diagnosis not present

## 2019-01-29 DIAGNOSIS — R2681 Unsteadiness on feet: Secondary | ICD-10-CM | POA: Diagnosis not present

## 2019-01-29 DIAGNOSIS — E669 Obesity, unspecified: Secondary | ICD-10-CM

## 2019-01-29 DIAGNOSIS — R531 Weakness: Secondary | ICD-10-CM

## 2019-01-29 DIAGNOSIS — Z6841 Body Mass Index (BMI) 40.0 and over, adult: Secondary | ICD-10-CM

## 2019-01-29 DIAGNOSIS — R600 Localized edema: Secondary | ICD-10-CM | POA: Diagnosis not present

## 2019-01-29 NOTE — Progress Notes (Signed)
Careteam: [name], NP as PCP - General (Nurse Practitioner)  Advanced Directive information Does Patient Have a Medical Advance Directive?: No, Would patient like information on creating a medical advance directive?: Yes (MAU/Ambulatory/Procedural Areas - Information given)  Allergies  Allergen Reactions  . Uloric [Febuxostat] Other (See Comments)    Made the skin on feet and legs "hurt"    Chief Complaint  Patient presents with  . Medical Management of Chronic Issues    10 month follow-up, here with Friend   . Eye Problem    Discoloration around eyes   . Shortness of Breath    SOB related to weight   . Body cramps    Hand and stomach cramping, patient questions rx for potassium   . Orders    Discuss order for lift chair and motorized wheelchair  . Best Practice Recommendations    Discuss need for eye exam and Hep C screening      HPI: Patient is a 58 y.o. male seen in the office today for follow up.  Has missed several routine visit appt. States he can not get up on the scale due to problems with his knees  GOUT-swelling in hands but no recent flares. Continues on allopurinol 300 mg   Hyperlipidemia-does not follow dietary modifications, continues on lipitor 10 mg daily   Hypertension- maintained on lasix and lisiniopril 2.5 mg   CHF-continues on lasix 40 mg daily, reports he cramps up and questions if he needs additional potassium  He was hospitalized in march admitted with acute CHF in March - it was noted that he had noncompliance with diet and lasix. Dietary education was provided. He never followed up after hospitalization.  Pt reports it was due to transportation and COVID (however offices were offering telephone visit); he states had appt but "it was cancelled" and then he did not reschedule. Shortness of breath has progressively worsened since March.  Was placed on a fluid restriction during the hospital but friend reports he is  noncompliant with this.  DM- not currently on medication. A1c 6.1  Obesity- appears to have gained more weight.  Not getting around much. States due to COVID-19 he is unable to get up.  Wants a lift chair that will lift him up and help him get up. Currently sitting on a chair that is getting weak and wobbly and needs additional support. Has to have it at a certain height to get in and get out. Unable to get in and out of low chairs due to his weight. At some point he had to call the fire squad to get him out of a low sitting chair.  A lift chair recliner will help him elevate his leg. It is built to hold up to 700 lbs.  Friend brings in requirement  States that a lot of the issue is with transportation. He states he is very unsteady and weak. Fear of falling.  Unable to walk long distanced due to weakness, feels unsteady due to weight.   OA- stable, currently without pain.    Review of Systems:  Review of Systems  Constitutional: Negative for chills, fever and weight loss.  Respiratory: Positive for shortness of breath. Negative for cough and sputum production.   Cardiovascular: Positive for leg swelling (ongoing). Negative for chest pain and palpitations.  Gastrointestinal: Negative for abdominal pain, constipation, diarrhea and heartburn.  Genitourinary: Negative for dysuria, frequency and urgency.  Musculoskeletal: Positive for joint pain and myalgias.  Negative for back pain and falls.  Neurological: Negative for dizziness and headaches.  Psychiatric/Behavioral: Negative for depression and memory loss. The patient does not have insomnia.   *  Past Medical History:  Diagnosis Date  . Arthritis    "knees, hands" (04/19/2017)  . Childhood asthma   . Diabetes type 2, uncontrolled ([place])   . Gout    "have had it in both feet, both hands, right shoulder, back" (04/19/2017)  . Hyperlipidemia   . Hypertension   . Morbidly obese ([place])   . Pulmonary embolism ([place]) 2017?   Past  Surgical History:  Procedure Laterality Date  . COLONOSCOPY W/ BIOPSIES AND POLYPECTOMY  07/2011   Archie Endo 07/27/2011   Social History:   reports that he quit smoking about 36 years ago. He has never used smokeless tobacco. He reports previous drug use. He reports that he does not drink alcohol.  Family History  Problem Relation Age of Onset  . Colon cancer Maternal Grandfather 37  . Heart attack Father   . Stomach cancer Maternal Aunt 80    Medications: Patient's Medications  New Prescriptions   No medications on file  Previous Medications   ACETAMINOPHEN (TYLENOL) 500 MG TABLET    Take 1,000 mg by mouth every 4 (four) hours as needed (pain).   ALBUTEROL (VENTOLIN HFA) 108 (90 BASE) MCG/ACT INHALER    Inhale 1-2 puffs into the lungs every 6 (six) hours as needed for wheezing or shortness of breath.   ALLOPURINOL (ZYLOPRIM) 300 MG TABLET    Take 1 tablet (300 mg total) by mouth 2 (two) times daily.   ATORVASTATIN (LIPITOR) 10 MG TABLET    Take 1 tablet (10 mg total) by mouth daily at 6 PM.   COLCHICINE 0.6 MG TABLET    Take 1 tablet (0.6 mg total) by mouth daily.   DICLOFENAC SODIUM (VOLTAREN) 1 % GEL    Apply 2 g topically 4 (four) times daily.   FUROSEMIDE (LASIX) 40 MG TABLET    Take 1 tablet (40 mg total) by mouth daily.   HYDROCODONE-HOMATROPINE (HYCODAN) 5-1.5 MG/5ML SYRUP    Take 5 mLs by mouth every 6 (six) hours as needed for cough.   LISINOPRIL (PRINIVIL,ZESTRIL) 2.5 MG TABLET    Take 1 tablet (2.5 mg total) by mouth daily.   MULTIPLE VITAMIN (MULTIVITAMIN WITH MINERALS) TABS TABLET    Take 1 tablet by mouth daily. One a Day Men's  Modified Medications   No medications on file  Discontinued Medications   ALBUTEROL (PROVENTIL HFA;VENTOLIN HFA) 108 (90 BASE) MCG/ACT INHALER    Inhale 2 puffs into the lungs 3 (three) times daily. Use 2 puffs 3 times daily x5 days, then every 6 hours as needed.   BUDESONIDE-FORMOTEROL (SYMBICORT) 160-4.5 MCG/ACT INHALER    Inhale 2 puffs into the  lungs 2 (two) times daily.   FLUTICASONE (FLONASE) 50 MCG/ACT NASAL SPRAY    Place 2 sprays into both nostrils daily.   GENTAMICIN CREAM (GARAMYCIN) 0.1 %    Apply 1 application topically 2 (two) times daily.   PANTOPRAZOLE (PROTONIX) 40 MG TABLET    Take 1 tablet (40 mg total) by mouth daily at 6 (six) AM.   PREDNISONE (DELTASONE) 20 MG TABLET    Take 1-3 tablets (20-60 mg total) by mouth daily with breakfast. Take 3 tablets (65m) daily x2 days, then 2 tablets (46m daily x3 days, then 1 tablet (2043mdaily x3 days then stop.    Physical Exam:  Vitals:  01/29/19 1339  BP: (!) 142/80  Pulse: (!) 105  Resp: 12  Temp: 98.3 F (36.8 C)  TempSrc: Oral  SpO2: 96%   There is no height or weight on file to calculate BMI. Wt Readings from Last 3 Encounters:  01/29/19 (!) 514 lb 3.2 oz (233.2 kg)  08/29/18 (!) 431 lb 12.8 oz (195.9 kg)  04/17/18 (!) 462 lb 12.8 oz (209.9 kg)    Physical Exam Constitutional:      Appearance: Normal appearance. He is well-developed.  Eyes:     General: No scleral icterus.    Pupils: Pupils are equal, round, and reactive to light.  Neck:     Musculoskeletal: Neck supple. Decreased range of motion. Muscular tenderness present.     Thyroid: No thyromegaly.     Vascular: No carotid bruit.  Cardiovascular:     Rate and Rhythm: Normal rate and regular rhythm.     Heart sounds: Murmur (1/6 SEM) present. No friction rub. No gallop.   Pulmonary:     Effort: Pulmonary effort is normal.     Breath sounds: Normal breath sounds. No wheezing or rales.  Chest:     Chest wall: No tenderness.  Abdominal:     General: Bowel sounds are normal.     Palpations: Abdomen is soft. There is no hepatomegaly.     Comments: obese  Musculoskeletal:        General: Tenderness and deformity (in small joints to bilateral hands) present.     Right lower leg: Edema present.     Left lower leg: Edema present.  Lymphadenopathy:     Cervical: No cervical adenopathy.  Skin:     General: Skin is warm and dry.  Neurological:     Mental Status: He is alert and oriented to person, place, and time.  Psychiatric:        Behavior: Behavior normal.        Thought Content: Thought content normal.        Judgment: Judgment normal.     Labs reviewed: Basic Metabolic Panel: Recent Labs    08/26/18 0612 08/27/18 0714 08/28/18 0521 08/29/18 0457  NA 136 138 137 137  K 3.7 4.2 4.5 4.4  CL 99 102 98 97*  CO2 _0 GLUCOSE 193* 182* 202* 213*  BUN 18 25* 32* 40*  CREATININE 1.13 1.24 1.18 1.19  CALCIUM 8.8* 8.7* 8.6* 8.6*  MG 2.1 2.3  --   --   TSH 2.585  --   --   --    Liver Function Tests: Recent Labs    04/01/18 0626 08/26/18 0110 08/26/18 0612  AST 32 25 28  ALT _1 ALKPHOS 102 110 120  BILITOT 0.6 0.7 0.8  PROT 7.0 7.2 7.7  ALBUMIN 2.6* 3.1* 3.2*   No results for input(s): LIPASE, AMYLASE in the last 8760 hours. No results for input(s): AMMONIA in the last 8760 hours. CBC: Recent Labs    08/26/18 0612 08/27/18 0714 08/28/18 0521 08/29/18 0457  WBC 6.1 8.7 7.3 9.6  NEUTROABS 5.2 6.2 6.6  --   HGB 13.5 13.4 14.2 14.1  HCT 42.3 41.1 44.4 42.7  MCV 94.8 95.1 94.9 94.1  PLT 168 198 197 214   Lipid Panel: No results for input(s): CHOL, HDL, LDLCALC, TRIG, CHOLHDL, LDLDIRECT in the last 8760 hours. TSH: Recent Labs    08/26/18 0612  TSH 2.585   A1C: Lab Results  Component Value Date  HGBA1C 6.1 (H) 08/26/2018     Assessment/Plan 1. Class 3 severe obesity due to excess calories with serious comorbidity and body mass index (BMI) greater than or equal to 70 in adult [facility]) -non complaint with diet, also states that he can not do any activity due to COVID, had weakness and unsteady gait now due to debility.  - Amb Ref to Medical Weight Management - TSH - Ambulatory referral to Home Health  2. Weakness -progressive decline. Pt is very inactive with morbid obesity. Request lift chair but has not tried any alterative  methods. PT to evaluate. - Ambulatory referral to [facility] for further evaluation and treatment   3. Unsteady gait -due to progressive weight gain, pt feels weak and unbalanced as well.  - Ambulatory referral to [facility] for PT evaluation.   4. Bilateral edema of lower extremity -ongoing, most likely related to CHF, reports compliance with lasix  5. Gout with tophi -stable, without recent flare -continues on allopurinol 300 mg daily - Uric acid  6. Diabetes mellitus type 2 in obese [facility]) -will follow up lab at this time. - CBC with Differential/Platelet - Hemoglobin A1c - TSH  7. Hyperlipidemia, unspecified hyperlipidemia type -continues on lipitor 10 mg daily  - Lipid Panel - CMP with eGFR([facility]) - TSH  8. Chronic diastolic congestive heart failure ([place]) -reviewed hospital encounter for fluid restriction information but do not see this in note. Will defer to cardiologist in regards to a fluid restriction. He does admit to drinking a lot of fluids.  -states he has retained fluid but there has not been an acute increase in edema or shortness of breath. Denies chest pains.  - Ambulatory referral to Cardiology for further evaluation and management at this time. - Brain Natriuretic Peptide - CBC with Differential/Platelet - TSH - Ambulatory referral to [facility]  Next appt: 3 months.  Carlos American. [place], [facility] [phone]

## 2019-01-29 NOTE — Patient Instructions (Addendum)
If you do not hear back about cardiology referral in a week please call our office back and ask to speak with Lattie Haw (our referral coordinator) for an update.  Physical therapy referral has been placed  DASH Eating Plan DASH stands for "Dietary Approaches to Stop Hypertension." The DASH eating plan is a healthy eating plan that has been shown to reduce high blood pressure (hypertension). It may also reduce your risk for type 2 diabetes, heart disease, and stroke. The DASH eating plan may also help with weight loss. What are tips for following this plan?  General guidelines  Avoid eating more than 2,300 mg (milligrams) of salt (sodium) a day. If you have hypertension, you may need to reduce your sodium intake to 1,500 mg a day.  Limit alcohol intake to no more than 1 drink a day for nonpregnant women and 2 drinks a day for men. One drink equals 12 oz of beer, 5 oz of wine, or 1 oz of hard liquor.  Work with your health care provider to maintain a healthy body weight or to lose weight. Ask what an ideal weight is for you.  Get at least 30 minutes of exercise that causes your heart to beat faster (aerobic exercise) most days of the week. Activities may include walking, swimming, or biking.  Work with your health care provider or diet and nutrition specialist (dietitian) to adjust your eating plan to your individual calorie needs. Reading food labels   Check food labels for the amount of sodium per serving. Choose foods with less than 5 percent of the Daily Value of sodium. Generally, foods with less than 300 mg of sodium per serving fit into this eating plan.  To find whole grains, look for the word "whole" as the first word in the ingredient list. Shopping  Buy products labeled as "low-sodium" or "no salt added."  Buy fresh foods. Avoid canned foods and premade or frozen meals. Cooking  Avoid adding salt when cooking. Use salt-free seasonings or herbs instead of table salt or sea salt.  Check with your health care provider or pharmacist before using salt substitutes.  Do not fry foods. Cook foods using healthy methods such as baking, boiling, grilling, and broiling instead.  Cook with heart-healthy oils, such as olive, canola, soybean, or sunflower oil. Meal planning  Eat a balanced diet that includes: ? 5 or more servings of fruits and vegetables each day. At each meal, try to fill half of your plate with fruits and vegetables. ? Up to 6-8 servings of whole grains each day. ? Less than 6 oz of lean meat, poultry, or fish each day. A 3-oz serving of meat is about the same size as a deck of cards. One egg equals 1 oz. ? 2 servings of low-fat dairy each day. ? A serving of nuts, seeds, or beans 5 times each week. ? Heart-healthy fats. Healthy fats called Omega-3 fatty acids are found in foods such as flaxseeds and coldwater fish, like sardines, salmon, and mackerel.  Limit how much you eat of the following: ? Canned or prepackaged foods. ? Food that is high in trans fat, such as fried foods. ? Food that is high in saturated fat, such as fatty meat. ? Sweets, desserts, sugary drinks, and other foods with added sugar. ? Full-fat dairy products.  Do not salt foods before eating.  Try to eat at least 2 vegetarian meals each week.  Eat more home-cooked food and less restaurant, buffet, and fast food.  When eating at a restaurant, ask that your food be prepared with less salt or no salt, if possible. What foods are recommended? The items listed may not be a complete list. Talk with your dietitian about what dietary choices are best for you. Grains Whole-grain or whole-wheat bread. Whole-grain or whole-wheat pasta. Brown rice. [place]. Bulgur. Whole-grain and low-sodium cereals. Pita bread. Low-fat, low-sodium crackers. Whole-wheat flour tortillas. Vegetables Fresh or frozen vegetables (raw, steamed, roasted, or grilled). Low-sodium or reduced-sodium tomato and  vegetable juice. Low-sodium or reduced-sodium tomato sauce and tomato paste. Low-sodium or reduced-sodium canned vegetables. Fruits All fresh, dried, or frozen fruit. Canned fruit in natural juice (without added sugar). Meat and other protein foods Skinless chicken or [place]. Ground chicken or [place]. Pork with fat trimmed off. Fish and seafood. Egg whites. Dried beans, peas, or lentils. Unsalted nuts, nut butters, and seeds. Unsalted canned beans. Lean cuts of beef with fat trimmed off. Low-sodium, lean deli meat. Dairy Low-fat (1%) or fat-free (skim) milk. Fat-free, low-fat, or reduced-fat cheeses. Nonfat, low-sodium ricotta or cottage cheese. Low-fat or nonfat yogurt. Low-fat, low-sodium cheese. Fats and oils Soft margarine without trans fats. Vegetable oil. Low-fat, reduced-fat, or light mayonnaise and salad dressings (reduced-sodium). Canola, safflower, olive, soybean, and sunflower oils. Avocado. Seasoning and other foods Herbs. Spices. Seasoning mixes without salt. Unsalted popcorn and pretzels. Fat-free sweets. What foods are not recommended? The items listed may not be a complete list. Talk with your dietitian about what dietary choices are best for you. Grains Baked goods made with fat, such as croissants, muffins, or some breads. Dry pasta or rice meal packs. Vegetables Creamed or fried vegetables. Vegetables in a cheese sauce. Regular canned vegetables (not low-sodium or reduced-sodium). Regular canned tomato sauce and paste (not low-sodium or reduced-sodium). Regular tomato and vegetable juice (not low-sodium or reduced-sodium). Angie Fava. Olives. Fruits Canned fruit in a light or heavy syrup. Fried fruit. Fruit in cream or butter sauce. Meat and other protein foods Fatty cuts of meat. Ribs. Fried meat. Berniece Salines. Sausage. Bologna and other processed lunch meats. Salami. Fatback. Hotdogs. Bratwurst. Salted nuts and seeds. Canned beans with added salt. Canned or smoked fish. Whole eggs or  egg yolks. Chicken or [place] with skin. Dairy Whole or 2% milk, cream, and half-and-half. Whole or full-fat cream cheese. Whole-fat or sweetened yogurt. Full-fat cheese. Nondairy creamers. Whipped toppings. Processed cheese and cheese spreads. Fats and oils Butter. Stick margarine. Lard. Shortening. Ghee. Bacon fat. Tropical oils, such as coconut, palm kernel, or palm oil. Seasoning and other foods Salted popcorn and pretzels. Onion salt, garlic salt, seasoned salt, table salt, and sea salt. Worcestershire sauce. Tartar sauce. Barbecue sauce. Teriyaki sauce. Soy sauce, including reduced-sodium. Steak sauce. Canned and packaged gravies. Fish sauce. Oyster sauce. Cocktail sauce. Horseradish that you find on the shelf. Ketchup. Mustard. Meat flavorings and tenderizers. Bouillon cubes. Hot sauce and Tabasco sauce. Premade or packaged marinades. Premade or packaged taco seasonings. Relishes. Regular salad dressings. Where to find more information:  National Heart, Lung, and [place]: https://wilson-eaton.com/  American Heart Association: www.heart.org Summary  The DASH eating plan is a healthy eating plan that has been shown to reduce high blood pressure (hypertension). It may also reduce your risk for type 2 diabetes, heart disease, and stroke.  With the DASH eating plan, you should limit salt (sodium) intake to 2,300 mg a day. If you have hypertension, you may need to reduce your sodium intake to 1,500 mg a day.  When on the DASH eating plan,  aim to eat more fresh fruits and vegetables, whole grains, lean proteins, low-fat dairy, and heart-healthy fats.  Work with your health care provider or diet and nutrition specialist (dietitian) to adjust your eating plan to your individual calorie needs. This information is not intended to replace advice given to you by your health care provider. Make sure you discuss any questions you have with your health care provider. Document Released: 05/27/2011  Document Revised: 05/20/2017 Document Reviewed: 05/31/2016 Elsevier Patient Education  2020 [employer].

## 2019-01-30 LAB — COMPLETE METABOLIC PANEL WITH GFR
AG Ratio: 1 (calc) (ref 1.0–2.5)
ALT: 33 U/L (ref 9–46)
AST: 35 U/L (ref 10–35)
Albumin: 3.6 g/dL (ref 3.6–5.1)
Alkaline phosphatase (APISO): 117 U/L (ref 35–144)
BUN: 16 mg/dL (ref 7–25)
CO2: 25 mmol/L (ref 20–32)
Calcium: 9.1 mg/dL (ref 8.6–10.3)
Chloride: 103 mmol/L (ref 98–110)
Creat: 0.92 mg/dL (ref 0.70–1.33)
GFR, Est African American: 106 mL/min/{1.73_m2} (ref >=60)
GFR, Est Non African American: 91 mL/min/{1.73_m2} (ref >=60)
Globulin: 3.5 g/dL (calc) (ref 1.9–3.7)
Glucose, Bld: 117 mg/dL (ref 65–139)
Potassium: 4.2 mmol/L (ref 3.5–5.3)
Sodium: 138 mmol/L (ref 135–146)
Total Bilirubin: 0.6 mg/dL (ref 0.2–1.2)
Total Protein: 7.1 g/dL (ref 6.1–8.1)

## 2019-01-30 LAB — CBC WITH DIFFERENTIAL/PLATELET
Absolute Monocytes: 448 cells/uL (ref 200–950)
Basophils Absolute: 21 cells/uL (ref 0–200)
Basophils Relative: 0.3 %
Eosinophils Absolute: 266 cells/uL (ref 15–500)
Eosinophils Relative: 3.8 %
HCT: 41.5 % (ref 38.5–50.0)
Hemoglobin: 14 g/dL (ref 13.2–17.1)
Lymphs Abs: 1813 cells/uL (ref 850–3900)
MCH: 31.4 pg (ref 27.0–33.0)
MCHC: 33.7 g/dL (ref 32.0–36.0)
MCV: 93 fL (ref 80.0–100.0)
MPV: 11.9 fL (ref 7.5–12.5)
Monocytes Relative: 6.4 %
Neutro Abs: 4452 cells/uL (ref 1500–7800)
Neutrophils Relative %: 63.6 %
Platelets: 195 10*3/uL (ref 140–400)
RBC: 4.46 10*6/uL (ref 4.20–5.80)
RDW: 12.8 % (ref 11.0–15.0)
Total Lymphocyte: 25.9 %
WBC: 7 10*3/uL (ref 3.8–10.8)

## 2019-01-30 LAB — LIPID PANEL
Cholesterol: 146 mg/dL (ref <200)
HDL: 43 mg/dL (ref >=40)
LDL Cholesterol (Calc): 82 mg/dL (calc)
Non-HDL Cholesterol (Calc): 103 mg/dL (calc) (ref <130)
Total CHOL/HDL Ratio: 3.4 (calc) (ref <5.0)
Triglycerides: 111 mg/dL (ref <150)

## 2019-01-30 LAB — HEMOGLOBIN A1C
Hgb A1c MFr Bld: 6.8 % of total Hgb — ABNORMAL HIGH (ref <5.7)
Mean Plasma Glucose: 148 (calc)
eAG (mmol/L): 8.2 (calc)

## 2019-01-30 LAB — TSH: TSH: 4.98 mIU/L — ABNORMAL HIGH (ref 0.40–4.50)

## 2019-01-30 LAB — URIC ACID: Uric Acid, Serum: 9.6 mg/dL — ABNORMAL HIGH (ref 4.0–8.0)

## 2019-01-30 LAB — BRAIN NATRIURETIC PEPTIDE: Brain Natriuretic Peptide: 40 pg/mL (ref <100)

## 2019-02-01 ENCOUNTER — Telehealth: Payer: Self-pay | Admitting: Nurse Practitioner

## 2019-02-01 ENCOUNTER — Other Ambulatory Visit: Payer: Self-pay | Admitting: Nurse Practitioner

## 2019-02-01 DIAGNOSIS — R600 Localized edema: Secondary | ICD-10-CM

## 2019-02-01 NOTE — Telephone Encounter (Signed)
Helene Kelp with [facility] called regarding the referral for home health services for patient.  Patient has [facility] and [facility] is not in network and will not be able to provide services for patient.

## 2019-02-02 NOTE — Telephone Encounter (Signed)
I called patient he stated he  did not request a refill and that he has a few he will finish up.

## 2019-03-11 NOTE — Progress Notes (Signed)
Cardiology Office Note:    Date:  03/12/2019   ID:  Ronnie Hernandez, DOB 07/15/1960, MRN [id number]  PCP:  [name], NP  Cardiologist:  No primary care provider on file.  Electrophysiologist:  None   Referring MD: [name], NP   Chief complaint: dyspnea  History of Present Illness:    Ronnie Hernandez is a 58 y.o. male with a hx of heart failure with preserved ejection fraction hypertension, gout chronic anemia who is referred by Sherrie Mustache, NP for evaluation of diastolic heart failure.  He was admitted in March 2020 with acute hypoxic respiratory failure thought to be secondary to acute diastolic heart failure and asthma exacerbation.  BNP was normal.  Appeared volume overloaded on exam.  Was treated with IV Lasix, steroid taper, azithromycin.  TTE was done and showed normal EF, mild LVH, indeterminate diastolic function.  Discharged on Lasix 40 mg.  On follow-up on 01/29/2019, reported worsening shortness of breath.  BNP 40.  Creatinine 0.9.  Reports that he feels lasix is working, shortness of breath has improved but continues to have LE edema.    Past Medical History:  Diagnosis Date  . Arthritis    "knees, hands" (04/19/2017)  . Childhood asthma   . Diabetes type 2, uncontrolled ([place])   . Gout    "have had it in both feet, both hands, right shoulder, back" (04/19/2017)  . Hyperlipidemia   . Hypertension   . Morbidly obese ([place])   . Pulmonary embolism ([place]) 2017?    Past Surgical History:  Procedure Laterality Date  . COLONOSCOPY W/ BIOPSIES AND POLYPECTOMY  07/2011   Archie Endo 07/27/2011    Current Medications: Current Meds  Medication Sig  . acetaminophen (TYLENOL) 500 MG tablet Take 1,000 mg by mouth every 4 (four) hours as needed (pain).  Marland Kitchen albuterol (VENTOLIN HFA) 108 (90 Base) MCG/ACT inhaler Inhale 1-2 puffs into the lungs every 6 (six) hours as needed for wheezing or shortness of breath.  . allopurinol (ZYLOPRIM) 300 MG tablet Take 1 tablet  (300 mg total) by mouth 2 (two) times daily.  Marland Kitchen atorvastatin (LIPITOR) 10 MG tablet Take 1 tablet (10 mg total) by mouth daily at 6 PM.  . colchicine 0.6 MG tablet Take 1 tablet (0.6 mg total) by mouth daily.  . diclofenac sodium (VOLTAREN) 1 % GEL Apply 2 g topically 4 (four) times daily.  . furosemide (LASIX) 40 MG tablet TAKE 1 TABLET(40 MG) BY MOUTH DAILY  . lisinopril (PRINIVIL,ZESTRIL) 2.5 MG tablet Take 1 tablet (2.5 mg total) by mouth daily.  . Multiple Vitamin (MULTIVITAMIN WITH MINERALS) TABS tablet Take 1 tablet by mouth daily. One a Day Men's     Allergies:   Uloric [febuxostat]   Social History   Socioeconomic History  . Marital status: Single    Spouse name: Not on file  . Number of children: Not on file  . Years of education: Not on file  . Highest education level: Not on file  Occupational History  . Not on file  Social Needs  . Financial resource strain: Not hard at all  . Food insecurity    Worry: Never true    Inability: Never true  . Transportation needs    Medical: No    Non-medical: No  Tobacco Use  . Smoking status: Former Smoker    Quit date: 02/09/1982    Years since quitting: 37.1  . Smokeless tobacco: Never Used  Substance and Sexual Activity  .  Alcohol use: No  . Drug use: Not Currently    Comment: 04/19/2017 "nothing since I was 21"  . Sexual activity: Never  Lifestyle  . Physical activity    Days per week: 0 days    Minutes per session: 0 min  . Stress: Not at all  Relationships  . Social connections    Talks on phone: More than three times a week    Gets together: More than three times a week    Attends religious service: More than 4 times per year    Active member of club or organization: No    Attends meetings of clubs or organizations: Never    Relationship status: Never married  Other Topics Concern  . Not on file  Social History Narrative  . Not on file     Family History: The patient's family history includes Colon cancer  (age of onset: 36) in his maternal grandfather; Heart attack in his father; Stomach cancer (age of onset: 26) in his maternal aunt.  ROS:   Please see the history of present illness.    All other systems reviewed and are negative.  EKGs/Labs/Other Studies Reviewed:    The following studies were reviewed today:   EKG:  EKG is ordered today.  The ekg ordered today demonstrates sinus tachycardia, rate 105, poor R wave progression, LAD, QTc 454, first degree AV block   Recent Labs: 08/27/2018: Magnesium 2.3 01/29/2019: ALT 33; Brain Natriuretic Peptide 40; BUN 16; Creat 0.92; Hemoglobin 14.0; Platelets 195; Potassium 4.2; Sodium 138; TSH 4.98  Recent Lipid Panel    Component Value Date/Time   CHOL 146 01/29/2019 1422   CHOL 179 12/05/2015 1226   TRIG 111 01/29/2019 1422   HDL 43 01/29/2019 1422   HDL 41 12/05/2015 1226   CHOLHDL 3.4 01/29/2019 1422   VLDL 17 07/07/2016 0359   LDLCALC 82 01/29/2019 1422    Physical Exam:    VS:  BP 128/77 (BP Location: Left Wrist)   Pulse (!) 105   Ht 5\' 7"  (1.702 m)   Wt (!) 513 lb 3.2 oz (232.8 kg)   BMI 80.38 kg/m     Wt Readings from Last 3 Encounters:  03/12/19 (!) 513 lb 3.2 oz (232.8 kg)  01/29/19 (!) 514 lb 3.2 oz (233.2 kg)  08/29/18 (!) 431 lb 12.8 oz (195.9 kg)     [id number] obesity,  in no acute distress HEENT: Normal NECK: No JVD appreciated but difficult to assess given habitus LYMPHATICS: No lymphadenopathy CARDIAC: distant heart sounds, RRR, no murmurs appreciated RESPIRATORY:  Clear to auscultation without rales, wheezing or rhonchi.  Distant breath sounds ABDOMEN: Soft, non-tender, non-distended MUSCULOSKELETAL:  Chronic venous stasis changes in b/l LE SKIN: Warm and dry NEUROLOGIC:  Alert and oriented x 3 PSYCHIATRIC:  Normal affect   ASSESSMENT:    1. Chronic heart failure with preserved ejection fraction ([place])   2. Essential hypertension   3. Morbid obesity ([place])   4. Hyperlipidemia, unspecified  hyperlipidemia type    PLAN:    In order of problems listed above:  Chronic diastolic heart failure: reports symptoms improving with PO lasix.  Recent labs showed normal BNP, renal function.  Difficult to assess volume status given habitus but appears fairly euvolemic, with LE edema 2/2 chronic venous stasis - Continue PO lasix 40 mg daily  Hypertension: appears controlled, continue lisinopril 2.5 mg daily  Morbid obesity: Body mass index is 80.38 kg/m.  Has been referred to weight management  Hyperlipidemia: On  atorvastatin 10 mg daily.  LDL 82 on 01/29/2019  Diabetes: hemoglobin A1c 6.8, not on medication   Medication Adjustments/Labs and Tests Ordered: Current medicines are reviewed at length with the patient today.  Concerns regarding medicines are outlined above.  No orders of the defined types were placed in this encounter.  No orders of the defined types were placed in this encounter.   Patient Instructions  Medication Instructions:  Your Physician recommend you continue on your current medication as directed.    If you need a refill on your cardiac medications before your next appointment, please call your pharmacy.   Lab work: None  Testing/Procedures: None  Follow-Up: Your physician recommends that you schedule a follow-up appointment in 6 months with Dr. Gardiner Rhyme         Signed, Donato Heinz, MD  03/12/2019 1:19 PM    Ronnie Hernandez

## 2019-03-12 ENCOUNTER — Ambulatory Visit: Payer: Medicare HMO | Admitting: Cardiology

## 2019-03-12 ENCOUNTER — Other Ambulatory Visit: Payer: Self-pay

## 2019-03-12 VITALS — BP 128/77 | HR 105 | Ht 67.0 in | Wt >= 6400 oz

## 2019-03-12 DIAGNOSIS — I10 Essential (primary) hypertension: Secondary | ICD-10-CM | POA: Diagnosis not present

## 2019-03-12 DIAGNOSIS — I5032 Chronic diastolic (congestive) heart failure: Secondary | ICD-10-CM | POA: Diagnosis not present

## 2019-03-12 DIAGNOSIS — E785 Hyperlipidemia, unspecified: Secondary | ICD-10-CM | POA: Diagnosis not present

## 2019-03-12 NOTE — Patient Instructions (Signed)
Medication Instructions:  Your Physician recommend you continue on your current medication as directed.    If you need a refill on your cardiac medications before your next appointment, please call your pharmacy.   Lab work: None  Testing/Procedures: None  Follow-Up: Your physician recommends that you schedule a follow-up appointment in 6 months with Dr. Gardiner Rhyme

## 2019-03-15 NOTE — Addendum Note (Signed)
Addended by: Meryl Crutch on: 03/15/2019 11:50 AM   Modules accepted: Orders

## 2019-04-12 ENCOUNTER — Other Ambulatory Visit: Payer: Self-pay | Admitting: Nurse Practitioner

## 2019-04-12 ENCOUNTER — Telehealth: Payer: Self-pay

## 2019-04-12 DIAGNOSIS — R2681 Unsteadiness on feet: Secondary | ICD-10-CM

## 2019-04-12 DIAGNOSIS — E1169 Type 2 diabetes mellitus with other specified complication: Secondary | ICD-10-CM

## 2019-04-12 DIAGNOSIS — E785 Hyperlipidemia, unspecified: Secondary | ICD-10-CM

## 2019-04-12 DIAGNOSIS — R531 Weakness: Secondary | ICD-10-CM

## 2019-04-12 DIAGNOSIS — M1A9XX1 Chronic gout, unspecified, with tophus (tophi): Secondary | ICD-10-CM

## 2019-04-12 DIAGNOSIS — I5032 Chronic diastolic (congestive) heart failure: Secondary | ICD-10-CM

## 2019-04-12 NOTE — Telephone Encounter (Signed)
Opened in error; Disregard.

## 2019-04-16 DIAGNOSIS — M170 Bilateral primary osteoarthritis of knee: Secondary | ICD-10-CM | POA: Diagnosis not present

## 2019-04-16 DIAGNOSIS — M1A9XX1 Chronic gout, unspecified, with tophus (tophi): Secondary | ICD-10-CM | POA: Diagnosis not present

## 2019-04-16 DIAGNOSIS — E785 Hyperlipidemia, unspecified: Secondary | ICD-10-CM | POA: Diagnosis not present

## 2019-04-16 DIAGNOSIS — I110 Hypertensive heart disease with heart failure: Secondary | ICD-10-CM | POA: Diagnosis not present

## 2019-04-16 DIAGNOSIS — L89892 Pressure ulcer of other site, stage 2: Secondary | ICD-10-CM | POA: Diagnosis not present

## 2019-04-16 DIAGNOSIS — Z6841 Body Mass Index (BMI) 40.0 and over, adult: Secondary | ICD-10-CM | POA: Diagnosis not present

## 2019-04-16 DIAGNOSIS — J4540 Moderate persistent asthma, uncomplicated: Secondary | ICD-10-CM | POA: Diagnosis not present

## 2019-04-16 DIAGNOSIS — I5032 Chronic diastolic (congestive) heart failure: Secondary | ICD-10-CM | POA: Diagnosis not present

## 2019-04-16 DIAGNOSIS — E1169 Type 2 diabetes mellitus with other specified complication: Secondary | ICD-10-CM | POA: Diagnosis not present

## 2019-04-20 DIAGNOSIS — Z6841 Body Mass Index (BMI) 40.0 and over, adult: Secondary | ICD-10-CM | POA: Diagnosis not present

## 2019-04-20 DIAGNOSIS — M1A9XX1 Chronic gout, unspecified, with tophus (tophi): Secondary | ICD-10-CM | POA: Diagnosis not present

## 2019-04-20 DIAGNOSIS — I5032 Chronic diastolic (congestive) heart failure: Secondary | ICD-10-CM | POA: Diagnosis not present

## 2019-04-20 DIAGNOSIS — E1169 Type 2 diabetes mellitus with other specified complication: Secondary | ICD-10-CM | POA: Diagnosis not present

## 2019-04-20 DIAGNOSIS — L89892 Pressure ulcer of other site, stage 2: Secondary | ICD-10-CM | POA: Diagnosis not present

## 2019-04-20 DIAGNOSIS — E785 Hyperlipidemia, unspecified: Secondary | ICD-10-CM | POA: Diagnosis not present

## 2019-04-20 DIAGNOSIS — I110 Hypertensive heart disease with heart failure: Secondary | ICD-10-CM | POA: Diagnosis not present

## 2019-04-20 DIAGNOSIS — J4540 Moderate persistent asthma, uncomplicated: Secondary | ICD-10-CM | POA: Diagnosis not present

## 2019-04-20 DIAGNOSIS — M170 Bilateral primary osteoarthritis of knee: Secondary | ICD-10-CM | POA: Diagnosis not present

## 2019-04-24 DIAGNOSIS — E785 Hyperlipidemia, unspecified: Secondary | ICD-10-CM | POA: Diagnosis not present

## 2019-04-24 DIAGNOSIS — I5032 Chronic diastolic (congestive) heart failure: Secondary | ICD-10-CM | POA: Diagnosis not present

## 2019-04-24 DIAGNOSIS — M1A9XX1 Chronic gout, unspecified, with tophus (tophi): Secondary | ICD-10-CM | POA: Diagnosis not present

## 2019-04-24 DIAGNOSIS — L89892 Pressure ulcer of other site, stage 2: Secondary | ICD-10-CM | POA: Diagnosis not present

## 2019-04-24 DIAGNOSIS — I110 Hypertensive heart disease with heart failure: Secondary | ICD-10-CM | POA: Diagnosis not present

## 2019-04-24 DIAGNOSIS — M170 Bilateral primary osteoarthritis of knee: Secondary | ICD-10-CM | POA: Diagnosis not present

## 2019-04-24 DIAGNOSIS — J4540 Moderate persistent asthma, uncomplicated: Secondary | ICD-10-CM | POA: Diagnosis not present

## 2019-04-24 DIAGNOSIS — E1169 Type 2 diabetes mellitus with other specified complication: Secondary | ICD-10-CM | POA: Diagnosis not present

## 2019-04-24 DIAGNOSIS — Z6841 Body Mass Index (BMI) 40.0 and over, adult: Secondary | ICD-10-CM | POA: Diagnosis not present

## 2019-04-26 DIAGNOSIS — L89892 Pressure ulcer of other site, stage 2: Secondary | ICD-10-CM | POA: Diagnosis not present

## 2019-04-26 DIAGNOSIS — J4540 Moderate persistent asthma, uncomplicated: Secondary | ICD-10-CM | POA: Diagnosis not present

## 2019-04-26 DIAGNOSIS — I110 Hypertensive heart disease with heart failure: Secondary | ICD-10-CM | POA: Diagnosis not present

## 2019-04-26 DIAGNOSIS — I5032 Chronic diastolic (congestive) heart failure: Secondary | ICD-10-CM | POA: Diagnosis not present

## 2019-04-26 DIAGNOSIS — E785 Hyperlipidemia, unspecified: Secondary | ICD-10-CM | POA: Diagnosis not present

## 2019-04-26 DIAGNOSIS — Z6841 Body Mass Index (BMI) 40.0 and over, adult: Secondary | ICD-10-CM | POA: Diagnosis not present

## 2019-04-26 DIAGNOSIS — E1169 Type 2 diabetes mellitus with other specified complication: Secondary | ICD-10-CM | POA: Diagnosis not present

## 2019-04-26 DIAGNOSIS — M170 Bilateral primary osteoarthritis of knee: Secondary | ICD-10-CM | POA: Diagnosis not present

## 2019-04-26 DIAGNOSIS — M1A9XX1 Chronic gout, unspecified, with tophus (tophi): Secondary | ICD-10-CM | POA: Diagnosis not present

## 2019-04-27 DIAGNOSIS — M170 Bilateral primary osteoarthritis of knee: Secondary | ICD-10-CM | POA: Diagnosis not present

## 2019-04-27 DIAGNOSIS — J4540 Moderate persistent asthma, uncomplicated: Secondary | ICD-10-CM | POA: Diagnosis not present

## 2019-04-27 DIAGNOSIS — Z6841 Body Mass Index (BMI) 40.0 and over, adult: Secondary | ICD-10-CM | POA: Diagnosis not present

## 2019-04-27 DIAGNOSIS — E1169 Type 2 diabetes mellitus with other specified complication: Secondary | ICD-10-CM | POA: Diagnosis not present

## 2019-04-27 DIAGNOSIS — M1A9XX1 Chronic gout, unspecified, with tophus (tophi): Secondary | ICD-10-CM | POA: Diagnosis not present

## 2019-04-27 DIAGNOSIS — L89892 Pressure ulcer of other site, stage 2: Secondary | ICD-10-CM | POA: Diagnosis not present

## 2019-04-27 DIAGNOSIS — I110 Hypertensive heart disease with heart failure: Secondary | ICD-10-CM | POA: Diagnosis not present

## 2019-04-27 DIAGNOSIS — I5032 Chronic diastolic (congestive) heart failure: Secondary | ICD-10-CM | POA: Diagnosis not present

## 2019-04-27 DIAGNOSIS — E785 Hyperlipidemia, unspecified: Secondary | ICD-10-CM | POA: Diagnosis not present

## 2019-04-29 ENCOUNTER — Other Ambulatory Visit: Payer: Self-pay | Admitting: Nurse Practitioner

## 2019-04-30 DIAGNOSIS — L89892 Pressure ulcer of other site, stage 2: Secondary | ICD-10-CM | POA: Diagnosis not present

## 2019-04-30 DIAGNOSIS — M1A9XX1 Chronic gout, unspecified, with tophus (tophi): Secondary | ICD-10-CM | POA: Diagnosis not present

## 2019-04-30 DIAGNOSIS — E785 Hyperlipidemia, unspecified: Secondary | ICD-10-CM | POA: Diagnosis not present

## 2019-04-30 DIAGNOSIS — Z6841 Body Mass Index (BMI) 40.0 and over, adult: Secondary | ICD-10-CM | POA: Diagnosis not present

## 2019-04-30 DIAGNOSIS — J4540 Moderate persistent asthma, uncomplicated: Secondary | ICD-10-CM | POA: Diagnosis not present

## 2019-04-30 DIAGNOSIS — E1169 Type 2 diabetes mellitus with other specified complication: Secondary | ICD-10-CM | POA: Diagnosis not present

## 2019-04-30 DIAGNOSIS — I5032 Chronic diastolic (congestive) heart failure: Secondary | ICD-10-CM | POA: Diagnosis not present

## 2019-04-30 DIAGNOSIS — I110 Hypertensive heart disease with heart failure: Secondary | ICD-10-CM | POA: Diagnosis not present

## 2019-04-30 DIAGNOSIS — M170 Bilateral primary osteoarthritis of knee: Secondary | ICD-10-CM | POA: Diagnosis not present

## 2019-05-02 DIAGNOSIS — Z6841 Body Mass Index (BMI) 40.0 and over, adult: Secondary | ICD-10-CM | POA: Diagnosis not present

## 2019-05-02 DIAGNOSIS — M1A9XX1 Chronic gout, unspecified, with tophus (tophi): Secondary | ICD-10-CM | POA: Diagnosis not present

## 2019-05-02 DIAGNOSIS — I5032 Chronic diastolic (congestive) heart failure: Secondary | ICD-10-CM | POA: Diagnosis not present

## 2019-05-02 DIAGNOSIS — E1169 Type 2 diabetes mellitus with other specified complication: Secondary | ICD-10-CM | POA: Diagnosis not present

## 2019-05-02 DIAGNOSIS — J4540 Moderate persistent asthma, uncomplicated: Secondary | ICD-10-CM | POA: Diagnosis not present

## 2019-05-02 DIAGNOSIS — M170 Bilateral primary osteoarthritis of knee: Secondary | ICD-10-CM | POA: Diagnosis not present

## 2019-05-02 DIAGNOSIS — L89892 Pressure ulcer of other site, stage 2: Secondary | ICD-10-CM | POA: Diagnosis not present

## 2019-05-02 DIAGNOSIS — E785 Hyperlipidemia, unspecified: Secondary | ICD-10-CM | POA: Diagnosis not present

## 2019-05-02 DIAGNOSIS — I110 Hypertensive heart disease with heart failure: Secondary | ICD-10-CM | POA: Diagnosis not present

## 2019-05-03 DIAGNOSIS — Z6841 Body Mass Index (BMI) 40.0 and over, adult: Secondary | ICD-10-CM | POA: Diagnosis not present

## 2019-05-03 DIAGNOSIS — I110 Hypertensive heart disease with heart failure: Secondary | ICD-10-CM | POA: Diagnosis not present

## 2019-05-03 DIAGNOSIS — E1169 Type 2 diabetes mellitus with other specified complication: Secondary | ICD-10-CM | POA: Diagnosis not present

## 2019-05-03 DIAGNOSIS — M1A9XX1 Chronic gout, unspecified, with tophus (tophi): Secondary | ICD-10-CM | POA: Diagnosis not present

## 2019-05-03 DIAGNOSIS — I5032 Chronic diastolic (congestive) heart failure: Secondary | ICD-10-CM | POA: Diagnosis not present

## 2019-05-03 DIAGNOSIS — J4540 Moderate persistent asthma, uncomplicated: Secondary | ICD-10-CM | POA: Diagnosis not present

## 2019-05-03 DIAGNOSIS — L89312 Pressure ulcer of right buttock, stage 2: Secondary | ICD-10-CM | POA: Diagnosis not present

## 2019-05-03 DIAGNOSIS — E785 Hyperlipidemia, unspecified: Secondary | ICD-10-CM | POA: Diagnosis not present

## 2019-05-03 DIAGNOSIS — L89892 Pressure ulcer of other site, stage 2: Secondary | ICD-10-CM | POA: Diagnosis not present

## 2019-05-03 DIAGNOSIS — M170 Bilateral primary osteoarthritis of knee: Secondary | ICD-10-CM | POA: Diagnosis not present

## 2019-05-04 DIAGNOSIS — J4540 Moderate persistent asthma, uncomplicated: Secondary | ICD-10-CM | POA: Diagnosis not present

## 2019-05-04 DIAGNOSIS — I5032 Chronic diastolic (congestive) heart failure: Secondary | ICD-10-CM | POA: Diagnosis not present

## 2019-05-04 DIAGNOSIS — L89892 Pressure ulcer of other site, stage 2: Secondary | ICD-10-CM | POA: Diagnosis not present

## 2019-05-04 DIAGNOSIS — I110 Hypertensive heart disease with heart failure: Secondary | ICD-10-CM | POA: Diagnosis not present

## 2019-05-04 DIAGNOSIS — E1169 Type 2 diabetes mellitus with other specified complication: Secondary | ICD-10-CM | POA: Diagnosis not present

## 2019-05-04 DIAGNOSIS — M1A9XX1 Chronic gout, unspecified, with tophus (tophi): Secondary | ICD-10-CM | POA: Diagnosis not present

## 2019-05-04 DIAGNOSIS — E785 Hyperlipidemia, unspecified: Secondary | ICD-10-CM | POA: Diagnosis not present

## 2019-05-04 DIAGNOSIS — Z6841 Body Mass Index (BMI) 40.0 and over, adult: Secondary | ICD-10-CM | POA: Diagnosis not present

## 2019-05-04 DIAGNOSIS — M170 Bilateral primary osteoarthritis of knee: Secondary | ICD-10-CM | POA: Diagnosis not present

## 2019-05-07 DIAGNOSIS — Z6841 Body Mass Index (BMI) 40.0 and over, adult: Secondary | ICD-10-CM | POA: Diagnosis not present

## 2019-05-07 DIAGNOSIS — M170 Bilateral primary osteoarthritis of knee: Secondary | ICD-10-CM | POA: Diagnosis not present

## 2019-05-07 DIAGNOSIS — J4540 Moderate persistent asthma, uncomplicated: Secondary | ICD-10-CM | POA: Diagnosis not present

## 2019-05-07 DIAGNOSIS — I110 Hypertensive heart disease with heart failure: Secondary | ICD-10-CM | POA: Diagnosis not present

## 2019-05-07 DIAGNOSIS — I5032 Chronic diastolic (congestive) heart failure: Secondary | ICD-10-CM | POA: Diagnosis not present

## 2019-05-07 DIAGNOSIS — L89892 Pressure ulcer of other site, stage 2: Secondary | ICD-10-CM | POA: Diagnosis not present

## 2019-05-07 DIAGNOSIS — M1A9XX1 Chronic gout, unspecified, with tophus (tophi): Secondary | ICD-10-CM | POA: Diagnosis not present

## 2019-05-07 DIAGNOSIS — E785 Hyperlipidemia, unspecified: Secondary | ICD-10-CM | POA: Diagnosis not present

## 2019-05-07 DIAGNOSIS — E1169 Type 2 diabetes mellitus with other specified complication: Secondary | ICD-10-CM | POA: Diagnosis not present

## 2019-05-10 DIAGNOSIS — M170 Bilateral primary osteoarthritis of knee: Secondary | ICD-10-CM | POA: Diagnosis not present

## 2019-05-10 DIAGNOSIS — Z6841 Body Mass Index (BMI) 40.0 and over, adult: Secondary | ICD-10-CM | POA: Diagnosis not present

## 2019-05-10 DIAGNOSIS — I5032 Chronic diastolic (congestive) heart failure: Secondary | ICD-10-CM | POA: Diagnosis not present

## 2019-05-10 DIAGNOSIS — E785 Hyperlipidemia, unspecified: Secondary | ICD-10-CM | POA: Diagnosis not present

## 2019-05-10 DIAGNOSIS — E1169 Type 2 diabetes mellitus with other specified complication: Secondary | ICD-10-CM | POA: Diagnosis not present

## 2019-05-10 DIAGNOSIS — I110 Hypertensive heart disease with heart failure: Secondary | ICD-10-CM | POA: Diagnosis not present

## 2019-05-10 DIAGNOSIS — J4540 Moderate persistent asthma, uncomplicated: Secondary | ICD-10-CM | POA: Diagnosis not present

## 2019-05-10 DIAGNOSIS — L89892 Pressure ulcer of other site, stage 2: Secondary | ICD-10-CM | POA: Diagnosis not present

## 2019-05-10 DIAGNOSIS — M1A9XX1 Chronic gout, unspecified, with tophus (tophi): Secondary | ICD-10-CM | POA: Diagnosis not present

## 2019-05-15 DIAGNOSIS — M170 Bilateral primary osteoarthritis of knee: Secondary | ICD-10-CM | POA: Diagnosis not present

## 2019-05-15 DIAGNOSIS — E1169 Type 2 diabetes mellitus with other specified complication: Secondary | ICD-10-CM | POA: Diagnosis not present

## 2019-05-15 DIAGNOSIS — M1A9XX1 Chronic gout, unspecified, with tophus (tophi): Secondary | ICD-10-CM | POA: Diagnosis not present

## 2019-05-15 DIAGNOSIS — I110 Hypertensive heart disease with heart failure: Secondary | ICD-10-CM | POA: Diagnosis not present

## 2019-05-15 DIAGNOSIS — I5032 Chronic diastolic (congestive) heart failure: Secondary | ICD-10-CM | POA: Diagnosis not present

## 2019-05-15 DIAGNOSIS — J4540 Moderate persistent asthma, uncomplicated: Secondary | ICD-10-CM | POA: Diagnosis not present

## 2019-05-15 DIAGNOSIS — Z6841 Body Mass Index (BMI) 40.0 and over, adult: Secondary | ICD-10-CM | POA: Diagnosis not present

## 2019-05-15 DIAGNOSIS — E785 Hyperlipidemia, unspecified: Secondary | ICD-10-CM | POA: Diagnosis not present

## 2019-05-15 DIAGNOSIS — L89892 Pressure ulcer of other site, stage 2: Secondary | ICD-10-CM | POA: Diagnosis not present

## 2019-05-18 DIAGNOSIS — I5032 Chronic diastolic (congestive) heart failure: Secondary | ICD-10-CM | POA: Diagnosis not present

## 2019-05-18 DIAGNOSIS — Z6841 Body Mass Index (BMI) 40.0 and over, adult: Secondary | ICD-10-CM | POA: Diagnosis not present

## 2019-05-18 DIAGNOSIS — J4540 Moderate persistent asthma, uncomplicated: Secondary | ICD-10-CM | POA: Diagnosis not present

## 2019-05-18 DIAGNOSIS — E1169 Type 2 diabetes mellitus with other specified complication: Secondary | ICD-10-CM | POA: Diagnosis not present

## 2019-05-18 DIAGNOSIS — E785 Hyperlipidemia, unspecified: Secondary | ICD-10-CM | POA: Diagnosis not present

## 2019-05-18 DIAGNOSIS — M1A9XX1 Chronic gout, unspecified, with tophus (tophi): Secondary | ICD-10-CM | POA: Diagnosis not present

## 2019-05-18 DIAGNOSIS — M170 Bilateral primary osteoarthritis of knee: Secondary | ICD-10-CM | POA: Diagnosis not present

## 2019-05-18 DIAGNOSIS — I110 Hypertensive heart disease with heart failure: Secondary | ICD-10-CM | POA: Diagnosis not present

## 2019-05-18 DIAGNOSIS — L89892 Pressure ulcer of other site, stage 2: Secondary | ICD-10-CM | POA: Diagnosis not present

## 2019-05-22 ENCOUNTER — Telehealth: Payer: Self-pay

## 2019-05-22 DIAGNOSIS — Z6841 Body Mass Index (BMI) 40.0 and over, adult: Secondary | ICD-10-CM | POA: Diagnosis not present

## 2019-05-22 DIAGNOSIS — I110 Hypertensive heart disease with heart failure: Secondary | ICD-10-CM | POA: Diagnosis not present

## 2019-05-22 DIAGNOSIS — M170 Bilateral primary osteoarthritis of knee: Secondary | ICD-10-CM | POA: Diagnosis not present

## 2019-05-22 DIAGNOSIS — E1169 Type 2 diabetes mellitus with other specified complication: Secondary | ICD-10-CM | POA: Diagnosis not present

## 2019-05-22 DIAGNOSIS — I5032 Chronic diastolic (congestive) heart failure: Secondary | ICD-10-CM | POA: Diagnosis not present

## 2019-05-22 DIAGNOSIS — M1A9XX1 Chronic gout, unspecified, with tophus (tophi): Secondary | ICD-10-CM | POA: Diagnosis not present

## 2019-05-22 DIAGNOSIS — J4540 Moderate persistent asthma, uncomplicated: Secondary | ICD-10-CM | POA: Diagnosis not present

## 2019-05-22 DIAGNOSIS — E785 Hyperlipidemia, unspecified: Secondary | ICD-10-CM | POA: Diagnosis not present

## 2019-05-22 DIAGNOSIS — L89892 Pressure ulcer of other site, stage 2: Secondary | ICD-10-CM | POA: Diagnosis not present

## 2019-05-22 MED ORDER — UNABLE TO FIND: 0 refills | Status: DC

## 2019-05-22 NOTE — Telephone Encounter (Signed)
Ronnie Hernandez OT with [facility] called and stated he needed verbal order to continue OT for 1 time a week for 5 weeks including this week. He also stated patient is in need of orders for bariatric drop arm commode and bariatric tub transfer bench. He said these orders could be faxed to [facility] at [phone]. Ronnie Hernandez also requested to be given a call back and if he could not be reached it was ok to leave a detailed message on voicemail.

## 2019-05-22 NOTE — Telephone Encounter (Signed)
Orders faxed

## 2019-05-22 NOTE — Telephone Encounter (Signed)
Called OT and left verbal orders for provider on Occupational therapist voicemail.

## 2019-05-22 NOTE — Telephone Encounter (Signed)
Okay to give verbal orders for OT and to send an Rx over for the DME

## 2019-05-23 DIAGNOSIS — I5032 Chronic diastolic (congestive) heart failure: Secondary | ICD-10-CM | POA: Diagnosis not present

## 2019-05-23 DIAGNOSIS — I110 Hypertensive heart disease with heart failure: Secondary | ICD-10-CM | POA: Diagnosis not present

## 2019-05-23 DIAGNOSIS — J4540 Moderate persistent asthma, uncomplicated: Secondary | ICD-10-CM | POA: Diagnosis not present

## 2019-05-23 DIAGNOSIS — L89892 Pressure ulcer of other site, stage 2: Secondary | ICD-10-CM | POA: Diagnosis not present

## 2019-05-23 DIAGNOSIS — Z6841 Body Mass Index (BMI) 40.0 and over, adult: Secondary | ICD-10-CM | POA: Diagnosis not present

## 2019-05-23 DIAGNOSIS — E785 Hyperlipidemia, unspecified: Secondary | ICD-10-CM | POA: Diagnosis not present

## 2019-05-23 DIAGNOSIS — M170 Bilateral primary osteoarthritis of knee: Secondary | ICD-10-CM | POA: Diagnosis not present

## 2019-05-23 DIAGNOSIS — M1A9XX1 Chronic gout, unspecified, with tophus (tophi): Secondary | ICD-10-CM | POA: Diagnosis not present

## 2019-05-23 DIAGNOSIS — E1169 Type 2 diabetes mellitus with other specified complication: Secondary | ICD-10-CM | POA: Diagnosis not present

## 2019-05-24 DIAGNOSIS — Z6841 Body Mass Index (BMI) 40.0 and over, adult: Secondary | ICD-10-CM | POA: Diagnosis not present

## 2019-05-24 DIAGNOSIS — M170 Bilateral primary osteoarthritis of knee: Secondary | ICD-10-CM | POA: Diagnosis not present

## 2019-05-24 DIAGNOSIS — E785 Hyperlipidemia, unspecified: Secondary | ICD-10-CM | POA: Diagnosis not present

## 2019-05-24 DIAGNOSIS — L89892 Pressure ulcer of other site, stage 2: Secondary | ICD-10-CM | POA: Diagnosis not present

## 2019-05-24 DIAGNOSIS — I110 Hypertensive heart disease with heart failure: Secondary | ICD-10-CM | POA: Diagnosis not present

## 2019-05-24 DIAGNOSIS — I5032 Chronic diastolic (congestive) heart failure: Secondary | ICD-10-CM | POA: Diagnosis not present

## 2019-05-24 DIAGNOSIS — E1169 Type 2 diabetes mellitus with other specified complication: Secondary | ICD-10-CM | POA: Diagnosis not present

## 2019-05-24 DIAGNOSIS — M1A9XX1 Chronic gout, unspecified, with tophus (tophi): Secondary | ICD-10-CM | POA: Diagnosis not present

## 2019-05-24 DIAGNOSIS — J4540 Moderate persistent asthma, uncomplicated: Secondary | ICD-10-CM | POA: Diagnosis not present

## 2019-05-28 DIAGNOSIS — M1A9XX1 Chronic gout, unspecified, with tophus (tophi): Secondary | ICD-10-CM | POA: Diagnosis not present

## 2019-05-28 DIAGNOSIS — M170 Bilateral primary osteoarthritis of knee: Secondary | ICD-10-CM | POA: Diagnosis not present

## 2019-05-28 DIAGNOSIS — I5032 Chronic diastolic (congestive) heart failure: Secondary | ICD-10-CM | POA: Diagnosis not present

## 2019-05-28 DIAGNOSIS — I110 Hypertensive heart disease with heart failure: Secondary | ICD-10-CM | POA: Diagnosis not present

## 2019-05-28 DIAGNOSIS — L89892 Pressure ulcer of other site, stage 2: Secondary | ICD-10-CM | POA: Diagnosis not present

## 2019-05-28 DIAGNOSIS — E785 Hyperlipidemia, unspecified: Secondary | ICD-10-CM | POA: Diagnosis not present

## 2019-05-28 DIAGNOSIS — E1169 Type 2 diabetes mellitus with other specified complication: Secondary | ICD-10-CM | POA: Diagnosis not present

## 2019-05-28 DIAGNOSIS — J4540 Moderate persistent asthma, uncomplicated: Secondary | ICD-10-CM | POA: Diagnosis not present

## 2019-05-28 DIAGNOSIS — Z6841 Body Mass Index (BMI) 40.0 and over, adult: Secondary | ICD-10-CM | POA: Diagnosis not present

## 2019-05-29 DIAGNOSIS — I5032 Chronic diastolic (congestive) heart failure: Secondary | ICD-10-CM | POA: Diagnosis not present

## 2019-05-29 DIAGNOSIS — J4540 Moderate persistent asthma, uncomplicated: Secondary | ICD-10-CM | POA: Diagnosis not present

## 2019-05-29 DIAGNOSIS — L89892 Pressure ulcer of other site, stage 2: Secondary | ICD-10-CM | POA: Diagnosis not present

## 2019-05-29 DIAGNOSIS — M1A9XX1 Chronic gout, unspecified, with tophus (tophi): Secondary | ICD-10-CM | POA: Diagnosis not present

## 2019-05-29 DIAGNOSIS — M170 Bilateral primary osteoarthritis of knee: Secondary | ICD-10-CM | POA: Diagnosis not present

## 2019-05-29 DIAGNOSIS — E785 Hyperlipidemia, unspecified: Secondary | ICD-10-CM | POA: Diagnosis not present

## 2019-05-29 DIAGNOSIS — Z6841 Body Mass Index (BMI) 40.0 and over, adult: Secondary | ICD-10-CM | POA: Diagnosis not present

## 2019-05-29 DIAGNOSIS — E1169 Type 2 diabetes mellitus with other specified complication: Secondary | ICD-10-CM | POA: Diagnosis not present

## 2019-05-29 DIAGNOSIS — I110 Hypertensive heart disease with heart failure: Secondary | ICD-10-CM | POA: Diagnosis not present

## 2019-05-31 ENCOUNTER — Telehealth: Payer: Self-pay | Admitting: *Deleted

## 2019-05-31 NOTE — Telephone Encounter (Signed)
Received form from [employer] for [employer]. Placed in [place] folder to review and sign. To be faxed back to [phone] once completed.

## 2019-06-05 DIAGNOSIS — M170 Bilateral primary osteoarthritis of knee: Secondary | ICD-10-CM | POA: Diagnosis not present

## 2019-06-05 DIAGNOSIS — I5032 Chronic diastolic (congestive) heart failure: Secondary | ICD-10-CM | POA: Diagnosis not present

## 2019-06-05 DIAGNOSIS — E1169 Type 2 diabetes mellitus with other specified complication: Secondary | ICD-10-CM | POA: Diagnosis not present

## 2019-06-05 DIAGNOSIS — L89892 Pressure ulcer of other site, stage 2: Secondary | ICD-10-CM | POA: Diagnosis not present

## 2019-06-05 DIAGNOSIS — E785 Hyperlipidemia, unspecified: Secondary | ICD-10-CM | POA: Diagnosis not present

## 2019-06-05 DIAGNOSIS — J4540 Moderate persistent asthma, uncomplicated: Secondary | ICD-10-CM | POA: Diagnosis not present

## 2019-06-05 DIAGNOSIS — Z6841 Body Mass Index (BMI) 40.0 and over, adult: Secondary | ICD-10-CM | POA: Diagnosis not present

## 2019-06-05 DIAGNOSIS — I110 Hypertensive heart disease with heart failure: Secondary | ICD-10-CM | POA: Diagnosis not present

## 2019-06-05 DIAGNOSIS — M1A9XX1 Chronic gout, unspecified, with tophus (tophi): Secondary | ICD-10-CM | POA: Diagnosis not present

## 2019-06-06 ENCOUNTER — Encounter: Payer: Self-pay | Admitting: Nurse Practitioner

## 2019-06-06 ENCOUNTER — Other Ambulatory Visit: Payer: Self-pay

## 2019-06-06 ENCOUNTER — Ambulatory Visit (INDEPENDENT_AMBULATORY_CARE_PROVIDER_SITE_OTHER): Payer: Medicare HMO | Admitting: Nurse Practitioner

## 2019-06-06 DIAGNOSIS — R600 Localized edema: Secondary | ICD-10-CM

## 2019-06-06 DIAGNOSIS — E669 Obesity, unspecified: Secondary | ICD-10-CM | POA: Diagnosis not present

## 2019-06-06 DIAGNOSIS — E1169 Type 2 diabetes mellitus with other specified complication: Secondary | ICD-10-CM

## 2019-06-06 DIAGNOSIS — M1A9XX1 Chronic gout, unspecified, with tophus (tophi): Secondary | ICD-10-CM

## 2019-06-06 DIAGNOSIS — R21 Rash and other nonspecific skin eruption: Secondary | ICD-10-CM

## 2019-06-06 DIAGNOSIS — Z6841 Body Mass Index (BMI) 40.0 and over, adult: Secondary | ICD-10-CM | POA: Diagnosis not present

## 2019-06-06 NOTE — Progress Notes (Signed)
This service is provided via telemedicine  No vital signs collected/recorded due to the encounter was a telemedicine visit.   Location of patient (ex: home, work):  Home   Patient consents to a telephone visit:  Yes  Location of the provider (ex: office, home):  [facility], Office   Name of any referring provider:  N/A  Names of all persons participating in the telemedicine service and their role in the encounter: S.Chrae B/CMA, Sherrie Mustache, NP, and Patient   Time spent on call:  6 min with medical assistant      Careteam: [name], NP as PCP - General (Nurse Practitioner)  Advanced Directive information    Allergies  Allergen Reactions  . Uloric [Febuxostat] Other (See Comments)    Made the skin on feet and legs "hurt"    Chief Complaint  Patient presents with  . Form Completion    Diabetic shoe form completion. Telephone visit   . Medication Management    Discuss [place], patient is taking daily vs. at the onset of gout attack.   . Eczema    Requesting treatment for Eczema on stomach, buttocks, and legs.      HPI: Patient is a 58 y.o. male via telephone visit, unable to connect virtually.  Reports flare of gout last week but better now. Reports he is taking allopurinol 300 mg by mouth twice daily and colchicine daily (states he has a back supply of the colchicine) reports he had 300 pills at home and has been taking these now.   Never met with a nutritionist, he said he is in the process of changing in insurance and felt like there was an issue with that, getting new at the first of the year.   DM- no neuropathy or callus, no hx of ulcers to feet. 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 Not taking medication for diabetes. Reports "blood sugars are good" but has not checked them.  Working with PT and OT. Has gotten DME to the home which has helped mobility.   Did not schedule  office follow up for November. Has an issue with transportation.   Edema- ongoing in LE, worse at times. Overall controlled.    Review of Systems:  Review of Systems  Constitutional: Negative for chills and fever.  Respiratory: Negative for cough and sputum production.   Cardiovascular: Positive for leg swelling (on and off). Negative for chest pain and palpitations.  Musculoskeletal: Positive for joint pain. Negative for myalgias.  Skin: Positive for itching and rash.       Hx of eczema   Neurological: Negative for dizziness and headaches.    Past Medical History:  Diagnosis Date  . Arthritis    "knees, hands" (04/19/2017)  . Childhood asthma   . Diabetes type 2, uncontrolled ([place])   . Gout    "have had it in both feet, both hands, right shoulder, back" (04/19/2017)  . Hyperlipidemia   . Hypertension   . Morbidly obese ([place])   . Pulmonary embolism ([place]) 2017?   Past Surgical History:  Procedure Laterality Date  . COLONOSCOPY W/ BIOPSIES AND POLYPECTOMY  07/2011   Archie Endo 07/27/2011   Social History:   reports that he quit smoking about 37 years ago. He has never used smokeless tobacco. He reports previous drug use. He reports that he does not drink alcohol.  Family History  Problem Relation Age of Onset  .  Colon cancer Maternal Grandfather 45  . Heart attack Father   . Stomach cancer Maternal Aunt 80    Medications: Patient's Medications  New Prescriptions   No medications on file  Previous Medications   ACETAMINOPHEN (TYLENOL) 500 MG TABLET    Take 1,000 mg by mouth every 4 (four) hours as needed (pain).   ALBUTEROL (VENTOLIN HFA) 108 (90 BASE) MCG/ACT INHALER    Inhale 1-2 puffs into the lungs every 6 (six) hours as needed for wheezing or shortness of breath.   ALLOPURINOL (ZYLOPRIM) 300 MG TABLET    Take 1 tablet (300 mg total) by mouth 2 (two) times daily.   ATORVASTATIN (LIPITOR) 10 MG TABLET    Take 1 tablet (10 mg total) by mouth daily at 6 PM.   COLCHICINE  0.6 MG TABLET    Take 1 tablet (0.6 mg total) by mouth daily.   DICLOFENAC SODIUM (VOLTAREN) 1 % GEL    Apply 2 g topically 4 (four) times daily.   FUROSEMIDE (LASIX) 40 MG TABLET    TAKE 1 TABLET(40 MG) BY MOUTH DAILY   LISINOPRIL (ZESTRIL) 2.5 MG TABLET    TAKE 1 TABLET BY MOUTH EVERY DAY   MULTIPLE VITAMIN (MULTIVITAMIN WITH MINERALS) TABS TABLET    Take 1 tablet by mouth daily. One a Day Men's   UNABLE TO FIND    bariatric drop arm commode   UNABLE TO FIND    Bariatric tub transfer bench  Modified Medications   No medications on file  Discontinued Medications   No medications on file    Physical Exam:  There were no vitals filed for this visit. There is no height or weight on file to calculate BMI. Wt Readings from Last 3 Encounters:  03/12/19 (!) 513 lb 3.2 oz (232.8 kg)  01/29/19 (!) 514 lb 3.2 oz (233.2 kg)  08/29/18 (!) 431 lb 12.8 oz (195.9 kg)    Physical Exam  Labs reviewed: Basic Metabolic Panel: Recent Labs    08/26/18 0612 08/27/18 0714 08/28/18 0521 08/29/18 0457 01/29/19 1422  NA 136 138 137 137 138  K 3.7 4.2 4.5 4.4 4.2  CL 99 102 98 97* 103  CO2 '24 28 26 28 25  '$ GLUCOSE 193* 182* 202* 213* 117  BUN 18 25* 32* 40* 16  CREATININE 1.13 1.24 1.18 1.19 0.92  CALCIUM 8.8* 8.7* 8.6* 8.6* 9.1  MG 2.1 2.3  --   --   --   TSH 2.585  --   --   --  4.98*   Liver Function Tests: Recent Labs    08/26/18 0110 08/26/18 0612 01/29/19 1422  AST 25 28 35  ALT 20 23 33  ALKPHOS 110 120  --   BILITOT 0.7 0.8 0.6  PROT 7.2 7.7 7.1  ALBUMIN 3.1* 3.2*  --    No results for input(s): LIPASE, AMYLASE in the last 8760 hours. No results for input(s): AMMONIA in the last 8760 hours. CBC: Recent Labs    08/27/18 0714 08/28/18 0521 08/29/18 0457 01/29/19 1422  WBC 8.7 7.3 9.6 7.0  NEUTROABS 6.2 6.6  --  4,452  HGB 13.4 14.2 14.1 14.0  HCT 41.1 44.4 42.7 41.5  MCV 95.1 94.9 94.1 93.0  PLT 198 197 214 195   Lipid Panel: Recent Labs    01/29/19 1422  CHOL  146  HDL 43  LDLCALC 82  TRIG 111  CHOLHDL 3.4   TSH: Recent Labs    08/26/18 0612 01/29/19 1422  TSH 2.585 4.98*   A1C: Lab Results  Component Value Date   HGBA1C 6.8 (H) 01/29/2019     Assessment/Plan 1. Gout with tophi -reports recent attack but now improved, states he has been compliant with medication and taking a "back log" of the colchicine that he was prescribed previously. Encouraged dietary modifications. Will follow up uric acid at next visit.   2. Diabetes mellitus type 2 in obese ([place]) -reports blood sugars have been doing good but does not have machine to check them, offered to send in meter, strips and lancets but he declined. States he will wait to see what A1c is at next follow up appt. -requesting diabetic shoes, form completed and waiting for Dr Mariea Clonts for signature.   3. Bilateral edema of lower extremity Stable at this time. Goes and comes.  4. Body mass index (BMI) of 70 or greater in adult [facility]) Ongoing, unable to met with nutritionist due to insurance. Will refer again next year when his insurance has changed.  5. Rash -will follow up rash at in office visit in 1 month- unable to view via telephone encounter today.  Next appt:routine follow up- in office in january Collie Kittel K. Harle Battiest  [facility] [phone]    Virtual Visit via Telephone Note  I connected with pt on 06/06/19 at 11:30 AM EST by telephone and verified that I am speaking with the correct person using two identifiers.  Location: Patient: home Provider: office   I discussed the limitations, risks, security and privacy concerns of performing an evaluation and management service by telephone and the availability of in person appointments. I also discussed with the patient that there may be a patient responsible charge related to this service. The patient expressed understanding and agreed to proceed.   I discussed the assessment and treatment plan  with the patient. The patient was provided an opportunity to ask questions and all were answered. The patient agreed with the plan and demonstrated an understanding of the instructions.   The patient was advised to call back or seek an in-person evaluation if the symptoms worsen or if the condition fails to improve as anticipated.  I provided 18 minutes of non-face-to-face time during this encounter.  Carlos American. Harle Battiest Avs printed and mailed

## 2019-06-07 ENCOUNTER — Telehealth: Payer: Self-pay | Admitting: *Deleted

## 2019-06-07 DIAGNOSIS — E785 Hyperlipidemia, unspecified: Secondary | ICD-10-CM | POA: Diagnosis not present

## 2019-06-07 DIAGNOSIS — Z6841 Body Mass Index (BMI) 40.0 and over, adult: Secondary | ICD-10-CM | POA: Diagnosis not present

## 2019-06-07 DIAGNOSIS — M170 Bilateral primary osteoarthritis of knee: Secondary | ICD-10-CM | POA: Diagnosis not present

## 2019-06-07 DIAGNOSIS — I5032 Chronic diastolic (congestive) heart failure: Secondary | ICD-10-CM | POA: Diagnosis not present

## 2019-06-07 DIAGNOSIS — J4540 Moderate persistent asthma, uncomplicated: Secondary | ICD-10-CM | POA: Diagnosis not present

## 2019-06-07 DIAGNOSIS — I110 Hypertensive heart disease with heart failure: Secondary | ICD-10-CM | POA: Diagnosis not present

## 2019-06-07 DIAGNOSIS — E1169 Type 2 diabetes mellitus with other specified complication: Secondary | ICD-10-CM | POA: Diagnosis not present

## 2019-06-07 DIAGNOSIS — M1A9XX1 Chronic gout, unspecified, with tophus (tophi): Secondary | ICD-10-CM | POA: Diagnosis not present

## 2019-06-07 DIAGNOSIS — L89892 Pressure ulcer of other site, stage 2: Secondary | ICD-10-CM | POA: Diagnosis not present

## 2019-06-07 NOTE — Telephone Encounter (Signed)
Patient is requesting DME Equipment. Stated that he is wanting a Bedside Commode, Heavy duty cane and a Trapeze. Wants the orders faxed to [facility] Fax: [phone] Please Advise.

## 2019-06-07 NOTE — Telephone Encounter (Signed)
This is fine, due to debility and morbid obesity

## 2019-06-07 NOTE — Telephone Encounter (Signed)
Patient placed another incoming call stating Ronnie Hernandez with [facility] (previously known as [facility]) is trying to contact us and is unable to. Patient asked that I call Ronnie Hernandez @ [phone]  I left message on voicemail for Ronnie Hernandez informing him of [facility] phone and fax number.

## 2019-06-12 DIAGNOSIS — M170 Bilateral primary osteoarthritis of knee: Secondary | ICD-10-CM | POA: Diagnosis not present

## 2019-06-12 DIAGNOSIS — E785 Hyperlipidemia, unspecified: Secondary | ICD-10-CM | POA: Diagnosis not present

## 2019-06-12 DIAGNOSIS — I110 Hypertensive heart disease with heart failure: Secondary | ICD-10-CM | POA: Diagnosis not present

## 2019-06-12 DIAGNOSIS — L89892 Pressure ulcer of other site, stage 2: Secondary | ICD-10-CM | POA: Diagnosis not present

## 2019-06-12 DIAGNOSIS — M1A9XX1 Chronic gout, unspecified, with tophus (tophi): Secondary | ICD-10-CM | POA: Diagnosis not present

## 2019-06-12 DIAGNOSIS — Z6841 Body Mass Index (BMI) 40.0 and over, adult: Secondary | ICD-10-CM | POA: Diagnosis not present

## 2019-06-12 DIAGNOSIS — E1169 Type 2 diabetes mellitus with other specified complication: Secondary | ICD-10-CM | POA: Diagnosis not present

## 2019-06-12 DIAGNOSIS — I5032 Chronic diastolic (congestive) heart failure: Secondary | ICD-10-CM | POA: Diagnosis not present

## 2019-06-12 DIAGNOSIS — J4540 Moderate persistent asthma, uncomplicated: Secondary | ICD-10-CM | POA: Diagnosis not present

## 2019-07-05 ENCOUNTER — Telehealth: Payer: Self-pay | Admitting: *Deleted

## 2019-07-05 NOTE — Telephone Encounter (Signed)
Would a video visit, or telephone visit work?

## 2019-07-05 NOTE — Telephone Encounter (Signed)
This will likely need a face to face visit for insurance. Does he have PT and are they recommending it? Sometimes that documentation is enough.

## 2019-07-05 NOTE — Telephone Encounter (Signed)
Patient calling asking for order for a lift chair, pt would like to pick up order tomorrow, his niece will come by to bring his current up to date insurance card.

## 2019-07-06 NOTE — Telephone Encounter (Signed)
Video visit would be required

## 2019-07-06 NOTE — Telephone Encounter (Signed)
Pt has Video visit set up for Monday 07/08/2018, he will discuss the lift chair then. I have sent a text message to have them to set up mychart today.

## 2019-07-09 ENCOUNTER — Encounter: Payer: Self-pay | Admitting: Nurse Practitioner

## 2019-07-09 ENCOUNTER — Telehealth (INDEPENDENT_AMBULATORY_CARE_PROVIDER_SITE_OTHER): Payer: Medicare Other | Admitting: Nurse Practitioner

## 2019-07-09 ENCOUNTER — Other Ambulatory Visit: Payer: Self-pay

## 2019-07-09 DIAGNOSIS — E669 Obesity, unspecified: Secondary | ICD-10-CM

## 2019-07-09 DIAGNOSIS — R531 Weakness: Secondary | ICD-10-CM | POA: Diagnosis not present

## 2019-07-09 DIAGNOSIS — I10 Essential (primary) hypertension: Secondary | ICD-10-CM

## 2019-07-09 DIAGNOSIS — Z6841 Body Mass Index (BMI) 40.0 and over, adult: Secondary | ICD-10-CM

## 2019-07-09 DIAGNOSIS — I5032 Chronic diastolic (congestive) heart failure: Secondary | ICD-10-CM

## 2019-07-09 DIAGNOSIS — R600 Localized edema: Secondary | ICD-10-CM

## 2019-07-09 DIAGNOSIS — E1169 Type 2 diabetes mellitus with other specified complication: Secondary | ICD-10-CM

## 2019-07-09 DIAGNOSIS — R2681 Unsteadiness on feet: Secondary | ICD-10-CM | POA: Diagnosis not present

## 2019-07-09 DIAGNOSIS — M1A9XX1 Chronic gout, unspecified, with tophus (tophi): Secondary | ICD-10-CM

## 2019-07-09 DIAGNOSIS — E785 Hyperlipidemia, unspecified: Secondary | ICD-10-CM

## 2019-07-09 DIAGNOSIS — M542 Cervicalgia: Secondary | ICD-10-CM

## 2019-07-09 DIAGNOSIS — M1991 Primary osteoarthritis, unspecified site: Secondary | ICD-10-CM

## 2019-07-09 MED ORDER — DICLOFENAC SODIUM 1 % EX GEL: 2.0000 g | Freq: Four times a day (QID) | CUTANEOUS | 2 refills | Status: DC

## 2019-07-09 MED ORDER — UNABLE TO FIND: 0 refills | Status: DC

## 2019-07-09 NOTE — Progress Notes (Signed)
This service is provided via telemedicine  No vital signs collected/recorded due to the encounter was a telemedicine visit.   Location of patient (ex: home, work):  Home   Patient consents to a telephone visit:  Yes  Location of the provider (ex: office, home):  [facility], Office   Name of any referring provider:  N/A  Names of all persons participating in the telemedicine service and their role in the encounter:  S.Chrae B/CMA, Sherrie Mustache, NP, Niece Phineas Real) and Patient   Time spent on call:  10 min with medical assistant      Careteam: [name], NP as PCP - General (Nurse Practitioner)  Advanced Directive information Does Patient Have a Medical Advance Directive?: No, Would patient like information on creating a medical advance directive?: No - Patient declined  Allergies  Allergen Reactions  . Uloric [Febuxostat] Other (See Comments)    Made the skin on feet and legs "hurt"    Chief Complaint  Patient presents with  . Follow-up    1 month follow-up. Video/Telehealth   . Neck Pain    Requesting muscle relaxer for neck concerns x 3 days   . Orders    Requesting order for lift chair, commode, and transfer bench   . Medication Refill    Reorder Voltaren Gel      HPI: Patient is a 59 y.o. male via virtual visit for follow up.   Had physical therapy coming out to the house- reports home health requested several DME but he never received them.  Got a chair at [employer] but needs a stronger chair with his weight. He put a down payment on a chair. He is working with someone to get him a chair.   Neck pain for 3 days. Reports a crick in his neck for 3 days. Feels like he has a lot of tension in neck and shoulder. No injury- feels like he slept a certain way and after that starting hurting more.  Also feels like he is having more flare in pain in shoulders.  Used Voltaren gel to shoulders but out at this time.   Review of  Systems:  Review of Systems  Constitutional: Negative for chills, fever and weight loss.  Respiratory: Negative for cough and shortness of breath.   Cardiovascular: Positive for leg swelling (on and off to LE- no significant increase). Negative for chest pain.  Musculoskeletal: Positive for myalgias and neck pain (with decrease ROM).  Neurological: Negative for dizziness, sensory change, focal weakness, weakness and headaches.    Past Medical History:  Diagnosis Date  . Arthritis    "knees, hands" (04/19/2017)  . Childhood asthma   . Diabetes type 2, uncontrolled ([place])   . Gout    "have had it in both feet, both hands, right shoulder, back" (04/19/2017)  . Hyperlipidemia   . Hypertension   . Morbidly obese ([place])   . Pulmonary embolism ([place]) 2017?   Past Surgical History:  Procedure Laterality Date  . COLONOSCOPY W/ BIOPSIES AND POLYPECTOMY  07/2011   Archie Endo 07/27/2011   Social History:   reports that he quit smoking about 37 years ago. His smoking use included cigarettes. He has never used smokeless tobacco. He reports previous drug use. He reports that he does not drink alcohol.  Family History  Problem Relation Age of Onset  . Colon cancer Maternal Grandfather 27  . Heart attack Father   . Stomach cancer Maternal Aunt 37  Medications: Patient's Medications  New Prescriptions   No medications on file  Previous Medications   ACETAMINOPHEN (TYLENOL) 500 MG TABLET    Take 1,000 mg by mouth every 4 (four) hours as needed (pain).   ALBUTEROL (VENTOLIN HFA) 108 (90 BASE) MCG/ACT INHALER    Inhale 1-2 puffs into the lungs every 6 (six) hours as needed for wheezing or shortness of breath.   ALLOPURINOL (ZYLOPRIM) 300 MG TABLET    Take 1 tablet (300 mg total) by mouth 2 (two) times daily.   ATORVASTATIN (LIPITOR) 10 MG TABLET    Take 1 tablet (10 mg total) by mouth daily at 6 PM.   COLCHICINE 0.6 MG TABLET    Take 1 tablet (0.6 mg total) by mouth daily.   DICLOFENAC SODIUM  (VOLTAREN) 1 % GEL    Apply 2 g topically 4 (four) times daily.   FUROSEMIDE (LASIX) 40 MG TABLET    TAKE 1 TABLET(40 MG) BY MOUTH DAILY   LISINOPRIL (ZESTRIL) 2.5 MG TABLET    TAKE 1 TABLET BY MOUTH EVERY DAY   MULTIPLE VITAMIN (MULTIVITAMIN WITH MINERALS) TABS TABLET    Take 1 tablet by mouth daily. One a Day Men's   UNABLE TO FIND    bariatric drop arm commode   UNABLE TO FIND    Bariatric tub transfer bench  Modified Medications   No medications on file  Discontinued Medications   No medications on file    Physical Exam:  There were no vitals filed for this visit. There is no height or weight on file to calculate BMI. Wt Readings from Last 3 Encounters:  03/12/19 (!) 513 lb 3.2 oz (232.8 kg)  01/29/19 (!) 514 lb 3.2 oz (233.2 kg)  08/29/18 (!) 431 lb 12.8 oz (195.9 kg)     Labs reviewed: Basic Metabolic Panel: Recent Labs    08/26/18 0612 08/26/18 0612 08/27/18 0714 08/27/18 0714 08/28/18 0521 08/29/18 0457 01/29/19 1422  NA 136   < > 138   < > 137 137 138  K 3.7   < > 4.2   < > 4.5 4.4 4.2  CL 99   < > 102   < > 98 97* 103  CO2 24   < > 28   < > 26 28 25   GLUCOSE 193*   < > 182*   < > 202* 213* 117  BUN 18   < > 25*   < > 32* 40* 16  CREATININE 1.13   < > 1.24   < > 1.18 1.19 0.92  CALCIUM 8.8*   < > 8.7*   < > 8.6* 8.6* 9.1  MG 2.1  --  2.3  --   --   --   --   TSH 2.585  --   --   --   --   --  4.98*   < > = values in this interval not displayed.   Liver Function Tests: Recent Labs    08/26/18 0110 08/26/18 0612 01/29/19 1422  AST 25 28 35  ALT 20 23 33  ALKPHOS 110 120  --   BILITOT 0.7 0.8 0.6  PROT 7.2 7.7 7.1  ALBUMIN 3.1* 3.2*  --    No results for input(s): LIPASE, AMYLASE in the last 8760 hours. No results for input(s): AMMONIA in the last 8760 hours. CBC: Recent Labs    08/27/18 0714 08/27/18 0714 08/28/18 0521 08/29/18 0457 01/29/19 1422  WBC 8.7   < > 7.3  9.6 7.0  NEUTROABS 6.2  --  6.6  --  4,452  HGB 13.4   < > 14.2 14.1 14.0    HCT 41.1   < > 44.4 42.7 41.5  MCV 95.1   < > 94.9 94.1 93.0  PLT 198   < > 197 214 195   < > = values in this interval not displayed.   Lipid Panel: Recent Labs    01/29/19 1422  CHOL 146  HDL 43  LDLCALC 82  TRIG 111  CHOLHDL 3.4   TSH: Recent Labs    08/26/18 0612 01/29/19 1422  TSH 2.585 4.98*   A1C: Lab Results  Component Value Date   HGBA1C 6.8 (H) 01/29/2019     Assessment/Plan 1. Bilateral edema of lower extremity Off and on, request a lift chair that reclines so he can have his legs elevated.   2. Class 3 severe obesity due to excess calories with serious comorbidity and body mass index (BMI) greater than or equal to 70 in adult [facility]) -ongoing, weight loss has been discussed and continues to be encouraged with dietary modifications and increase in physical activity. Had referral to healthy weight management but unable to find transportation. - UNABLE TO FIND; bariatric drop arm commode Dx) R54, E66.01, I50.9, M19.9  Dispense: 1 each; Refill: 0 - UNABLE TO FIND; Bariatric tub transfer bench Dx) R54, E66.01, I50.9, M19.9  Dispense: 1 each; Refill: 0 - UNABLE TO FIND; Bariatric lift chair. Dx) R54, E66.01, I50.9, M19.9  Dispense: 1 Device; Refill: 0 - UNABLE TO FIND; Heavy duty Cane Dx) R54, E66.01, I50.9, M19.9  Dispense: 1 each; Refill: 0  3. Weakness -stable, just completed PT, DME recommended, orders as below. - UNABLE TO FIND; bariatric drop arm commode Dx) R54, E66.01, I50.9, M19.9  Dispense: 1 each; Refill: 0 - UNABLE TO FIND; Bariatric tub transfer bench Dx) R54, E66.01, I50.9, M19.9  Dispense: 1 each; Refill: 0 - UNABLE TO FIND; Bariatric lift chair. Dx) R54, E66.01, I50.9, M19.9  Dispense: 1 Device; Refill: 0 - UNABLE TO FIND; Heavy duty Cane Dx) R54, E66.01, I50.9, M19.9  Dispense: 1 each; Refill: 0  4. Unsteady gait Stable, completed PT, needing DME, orders as below.  - UNABLE TO FIND; bariatric drop arm commode Dx) R54, E66.01, I50.9, M19.9   Dispense: 1 each; Refill: 0 - UNABLE TO FIND; Bariatric tub transfer bench Dx) R54, E66.01, I50.9, M19.9  Dispense: 1 each; Refill: 0 - UNABLE TO FIND; Bariatric lift chair. Dx) R54, E66.01, I50.9, M19.9  Dispense: 1 Device; Refill: 0 - UNABLE TO FIND; Heavy duty Cane Dx) R54, E66.01, I50.9, M19.9  Dispense: 1 each; Refill: 0  5. Gout with tophi -stable, encouraged to continue dietary modifications.   6. Diabetes mellitus type 2 in obese [facility]) -will follow up A1c prior to next appt.  -encouraged dietary modifications.   7. Body mass index (BMI) of 70 or greater in adult [facility]) -noted, weight loss has been advised. Discussed weight reduction with dietary modifications and increase in physical activity - UNABLE TO FIND; bariatric drop arm commode Dx) R54, E66.01, I50.9, M19.9  Dispense: 1 each; Refill: 0 - UNABLE TO FIND; Bariatric tub transfer bench Dx) R54, E66.01, I50.9, M19.9  Dispense: 1 each; Refill: 0 - UNABLE TO FIND; Bariatric lift chair. Dx) R54, E66.01, I50.9, M19.9  Dispense: 1 Device; Refill: 0 - UNABLE TO FIND; Heavy duty Cane Dx) R54, E66.01, I50.9, M19.9  Dispense: 1 each; Refill: 0  8. Hyperlipidemia, unspecified hyperlipidemia type  Follow up lipids. Continues on lipitor and encouraged diet modifications.  9. Chronic diastolic congestive heart failure (HCC) -stable, no worsening in shortness of breath  - UNABLE TO FIND; bariatric drop arm commode Dx) R54, E66.01, I50.9, M19.9  Dispense: 1 each; Refill: 0 - UNABLE TO FIND; Bariatric tub transfer bench Dx) R54, E66.01, I50.9, M19.9  Dispense: 1 each; Refill: 0 - UNABLE TO FIND; Bariatric lift chair. Dx) R54, E66.01, I50.9, M19.9  Dispense: 1 Device; Refill: 0 - UNABLE TO FIND; Heavy duty Cane Dx) R54, E66.01, I50.9, M19.9  Dispense: 1 each; Refill: 0  10. Neck pain -reports stiff neck with decrease ROM. Will have him use heating pad, TID with muscle rub AFTER heat. If pain does not improve to notify  11. Essential  hypertension Encouraged to check blood pressure at home.  -low sodium diet.  -will follow up blood work prior to appt.   12. Primary osteoarthritis, unspecified site - diclofenac Sodium (VOLTAREN) 1 % GEL; Apply 2 g topically 4 (four) times daily.  Dispense: 100 g; Refill: 2 - UNABLE TO FIND; bariatric drop arm commode Dx) R54, E66.01, I50.9, M19.9  Dispense: 1 each; Refill: 0 - UNABLE TO FIND; Bariatric tub transfer bench Dx) R54, E66.01, I50.9, M19.9  Dispense: 1 each; Refill: 0 - UNABLE TO FIND; Bariatric lift chair. Dx) R54, E66.01, I50.9, M19.9  Dispense: 1 Device; Refill: 0 - UNABLE TO FIND; Heavy duty Cane Dx) R54, E66.01, I50.9, M19.9  Dispense: 1 each; Refill: 0  Next appt: 1 month for fasting blood work and follow up appt.  Ronnie Hernandez. Harle Battiest  [facility] [phone]   Virtual Visit via Video Note  I connected with Ronnie Hernandez on 07/09/19 at 11:30 AM EST by a video enabled telemedicine application and verified that I am speaking with the correct person using two identifiers.  Location: Patient: home Provider: office   I discussed the limitations of evaluation and management by telemedicine and the availability of in person appointments. The patient expressed understanding and agreed to proceed.    I discussed the assessment and treatment plan with the patient. The patient was provided an opportunity to ask questions and all were answered. The patient agreed with the plan and demonstrated an understanding of the instructions.   The patient was advised to call back or seek an in-person evaluation if the symptoms worsen or if the condition fails to improve as anticipated.  I provided 25 minutes of non-face-to-face time during this encounter.  Ronnie Hernandez. Dewaine Oats, AGNP Avs printed and mailed.

## 2019-07-25 ENCOUNTER — Other Ambulatory Visit: Payer: Self-pay | Admitting: *Deleted

## 2019-07-25 DIAGNOSIS — R600 Localized edema: Secondary | ICD-10-CM

## 2019-07-25 MED ORDER — FUROSEMIDE 40 MG PO TABS: ORAL_TABLET | ORAL | 0 refills | Status: DC

## 2019-07-25 NOTE — Telephone Encounter (Signed)
[facility]

## 2019-08-06 ENCOUNTER — Other Ambulatory Visit: Payer: Medicare Other

## 2019-08-10 ENCOUNTER — Ambulatory Visit: Payer: Medicare Other | Admitting: Nurse Practitioner

## 2019-08-15 ENCOUNTER — Other Ambulatory Visit: Payer: Self-pay

## 2019-08-15 ENCOUNTER — Encounter (HOSPITAL_COMMUNITY): Payer: Self-pay | Admitting: Emergency Medicine

## 2019-08-15 ENCOUNTER — Emergency Department (HOSPITAL_BASED_OUTPATIENT_CLINIC_OR_DEPARTMENT_OTHER): Payer: Medicare Other

## 2019-08-15 ENCOUNTER — Emergency Department (HOSPITAL_COMMUNITY): Payer: Medicare Other

## 2019-08-15 ENCOUNTER — Inpatient Hospital Stay (HOSPITAL_COMMUNITY)
Admission: EM | Admit: 2019-08-15 | Discharge: 2019-08-22 | DRG: 554 | Disposition: A | Discharge status: Home Health | Payer: Medicare Other | Attending: Internal Medicine | Admitting: Internal Medicine

## 2019-08-15 DIAGNOSIS — I10 Essential (primary) hypertension: Secondary | ICD-10-CM | POA: Diagnosis present

## 2019-08-15 DIAGNOSIS — M20032 Swan-neck deformity of left finger(s): Secondary | ICD-10-CM | POA: Diagnosis present

## 2019-08-15 DIAGNOSIS — D72829 Elevated white blood cell count, unspecified: Secondary | ICD-10-CM | POA: Diagnosis present

## 2019-08-15 DIAGNOSIS — Z20822 Contact with and (suspected) exposure to covid-19: Secondary | ICD-10-CM | POA: Diagnosis present

## 2019-08-15 DIAGNOSIS — Z8249 Family history of ischemic heart disease and other diseases of the circulatory system: Secondary | ICD-10-CM

## 2019-08-15 DIAGNOSIS — R5381 Other malaise: Secondary | ICD-10-CM

## 2019-08-15 DIAGNOSIS — M109 Gout, unspecified: Secondary | ICD-10-CM | POA: Diagnosis not present

## 2019-08-15 DIAGNOSIS — L8990 Pressure ulcer of unspecified site, unspecified stage: Secondary | ICD-10-CM | POA: Insufficient documentation

## 2019-08-15 DIAGNOSIS — R609 Edema, unspecified: Secondary | ICD-10-CM | POA: Diagnosis not present

## 2019-08-15 DIAGNOSIS — E119 Type 2 diabetes mellitus without complications: Secondary | ICD-10-CM

## 2019-08-15 DIAGNOSIS — R52 Pain, unspecified: Secondary | ICD-10-CM | POA: Diagnosis not present

## 2019-08-15 DIAGNOSIS — M1A9XX1 Chronic gout, unspecified, with tophus (tophi): Secondary | ICD-10-CM | POA: Diagnosis not present

## 2019-08-15 DIAGNOSIS — Z87891 Personal history of nicotine dependence: Secondary | ICD-10-CM

## 2019-08-15 DIAGNOSIS — L89152 Pressure ulcer of sacral region, stage 2: Secondary | ICD-10-CM | POA: Diagnosis present

## 2019-08-15 DIAGNOSIS — J45909 Unspecified asthma, uncomplicated: Secondary | ICD-10-CM | POA: Diagnosis present

## 2019-08-15 DIAGNOSIS — E1169 Type 2 diabetes mellitus with other specified complication: Secondary | ICD-10-CM | POA: Diagnosis present

## 2019-08-15 DIAGNOSIS — Z6841 Body Mass Index (BMI) 40.0 and over, adult: Secondary | ICD-10-CM

## 2019-08-15 DIAGNOSIS — M130 Polyarthritis, unspecified: Secondary | ICD-10-CM | POA: Diagnosis present

## 2019-08-15 DIAGNOSIS — E785 Hyperlipidemia, unspecified: Secondary | ICD-10-CM | POA: Diagnosis present

## 2019-08-15 DIAGNOSIS — M20031 Swan-neck deformity of right finger(s): Secondary | ICD-10-CM | POA: Diagnosis present

## 2019-08-15 DIAGNOSIS — Z888 Allergy status to other drugs, medicaments and biological substances status: Secondary | ICD-10-CM

## 2019-08-15 DIAGNOSIS — Z800 Family history of malignant neoplasm of digestive organs: Secondary | ICD-10-CM

## 2019-08-15 LAB — CBC WITH DIFFERENTIAL/PLATELET
Abs Immature Granulocytes: 0.06 10*3/uL (ref 0.00–0.07)
Basophils Absolute: 0 10*3/uL (ref 0.0–0.1)
Basophils Relative: 0 %
Eosinophils Absolute: 0.1 10*3/uL (ref 0.0–0.5)
Eosinophils Relative: 1 %
HCT: 44.9 % (ref 39.0–52.0)
Hemoglobin: 14.8 g/dL (ref 13.0–17.0)
Immature Granulocytes: 1 %
Lymphocytes Relative: 14 %
Lymphs Abs: 1.9 10*3/uL (ref 0.7–4.0)
MCH: 32.5 pg (ref 26.0–34.0)
MCHC: 33 g/dL (ref 30.0–36.0)
MCV: 98.7 fL (ref 80.0–100.0)
Monocytes Absolute: 1.4 10*3/uL — ABNORMAL HIGH (ref 0.1–1.0)
Monocytes Relative: 10 %
Neutro Abs: 9.9 10*3/uL — ABNORMAL HIGH (ref 1.7–7.7)
Neutrophils Relative %: 74 %
Platelets: 188 10*3/uL (ref 150–400)
RBC: 4.55 MIL/uL (ref 4.22–5.81)
RDW: 12.7 % (ref 11.5–15.5)
WBC: 13.3 10*3/uL — ABNORMAL HIGH (ref 4.0–10.5)
nRBC: 0 % (ref 0.0–0.2)

## 2019-08-15 LAB — BASIC METABOLIC PANEL
Anion gap: 10 (ref 5–15)
BUN: 13 mg/dL (ref 6–20)
CO2: 26 mmol/L (ref 22–32)
Calcium: 8.7 mg/dL — ABNORMAL LOW (ref 8.9–10.3)
Chloride: 99 mmol/L (ref 98–111)
Creatinine, Ser: 1.07 mg/dL (ref 0.61–1.24)
GFR calc Af Amer: >60 mL/min (ref >60)
GFR calc non Af Amer: >60 mL/min (ref >60)
Glucose, Bld: 128 mg/dL — ABNORMAL HIGH (ref 70–99)
Potassium: 4.8 mmol/L (ref 3.5–5.1)
Sodium: 135 mmol/L (ref 135–145)

## 2019-08-15 LAB — CBG MONITORING, ED: Glucose-Capillary: 121 mg/dL — ABNORMAL HIGH (ref 70–99)

## 2019-08-15 MED ORDER — OXYCODONE-ACETAMINOPHEN 5-325 MG PO TABS
1.0000 | ORAL_TABLET | Freq: Once | ORAL | Status: AC
  Administered 2019-08-15: 1 via ORAL
  Filled 2019-08-15: qty 1

## 2019-08-15 MED ORDER — HYDROCODONE-ACETAMINOPHEN 5-325 MG PO TABS
1.0000 | ORAL_TABLET | Freq: Once | ORAL | Status: AC
  Administered 2019-08-15: 1 via ORAL
  Filled 2019-08-15: qty 1

## 2019-08-15 MED ORDER — KETOROLAC TROMETHAMINE 15 MG/ML IJ SOLN
15.0000 mg | Freq: Once | INTRAMUSCULAR | Status: AC
  Administered 2019-08-15: 15 mg via INTRAVENOUS
  Filled 2019-08-15: qty 1

## 2019-08-15 MED ORDER — PREDNISONE 20 MG PO TABS
50.0000 mg | ORAL_TABLET | Freq: Once | ORAL | Status: AC
  Administered 2019-08-15: 50 mg via ORAL
  Filled 2019-08-15: qty 3

## 2019-08-15 NOTE — ED Provider Notes (Signed)
[facility] EMERGENCY DEPARTMENT Provider Note   CSN: [id number] Arrival date & time: 08/15/19  1453     History Chief Complaint  Patient presents with   Gout    Ronnie Hernandez is a 59 y.o. male with history of morbid obesity (234 kg today), gout, hypertension, hyperlipidemia, PE, asthma, diabetes.  Patient reports that over the past 4 days he has had increasing "gout pain".  He reports that his right knee and bilateral feet have developed a severe sharp burning sensation that is very consistent with previous episodes of gout, pain is constant worsened with movement and palpation, no alleviating factors.  He reports that he has had gout very often in the past and the only medications that help him are prednisone and other steroids.  He denies any abnormal feature of his gout pain today.  Patient made no mention of a fall to me on my initial evaluation, after review of triage note I addressed this with the patient.  He reports that he has been sitting in his recliner for the past 4 days due to his gout pain and had not attempted to get up.  He reports that this morning he attempted to push himself out of his chair and felt himself "slipping".  He reports that he was able to push a button that lowered his chair slightly so that way he could lean himself back into the chair.  He reports that he did not fall to the ground or have any injuries from his fall today, he reports that he simply sat back into his chair.  He denies any fever/chills, headache, vision changes, nausea/vomiting, chest pain, shortness of abdominal pain, extremity swelling/color change, numbness/tingling, weakness or any additional concerns. HPI     Past Medical History:  Diagnosis Date   Arthritis    "knees, hands" (04/19/2017)   Childhood asthma    Diabetes type 2, uncontrolled ([place])    Gout    "have had it in both feet, both hands, right shoulder, back" (04/19/2017)   Hyperlipidemia     Hypertension    Morbidly obese ([place])    Pulmonary embolism ([place]) 2017?    Patient Active Problem List   Diagnosis Date Noted   Gout attack 08/15/2019   Moderate persistent asthma with exacerbation    Acute respiratory failure with hypoxia ([facility]) 123456   Acute diastolic CHF (congestive heart failure) ([place]) 08/26/2018   Left knee pain 03/31/2018   Knee pain    Acute gout 04/18/2017   Pulmonary embolus ([facility]) 08/05/2016   Hyperglycemia, drug-induced 07/08/2016   Impaired glucose tolerance 07/08/2016   Leukocytosis 07/06/2016   Unable to walk 07/06/2016   Acute gouty arthritis 07/06/2016   Abnormality of gait 02/18/2016   Cellulitis of left lower extremity 01/27/2016   Type 2 diabetes mellitus ([place]) 12/25/2013   Drug-induced gout 12/25/2013   Hyperlipidemia 08/01/2013   Morbid obesity ([place])    Hypertension    Diabetes type 2, uncontrolled ([place])    Gout    Colon cancer screening 07/27/2011   Benign neoplasm of colon 07/27/2011    Past Surgical History:  Procedure Laterality Date   COLONOSCOPY W/ BIOPSIES AND POLYPECTOMY  07/2011   Archie Endo 07/27/2011       Family History  Problem Relation Age of Onset   Colon cancer Maternal Grandfather 92   Heart attack Father    Stomach cancer Maternal Aunt 80    Social History   Tobacco Use   Smoking status: Former  Smoker    Types: Cigarettes    Quit date: 02/09/1982    Years since quitting: 37.5   Smokeless tobacco: Never Used  Substance Use Topics   Alcohol use: No   Drug use: Not Currently    Comment: 04/19/2017 "nothing since I was 21"    Home Medications Prior to Admission medications   Medication Sig Start Date End Date Taking? Authorizing Provider  acetaminophen (TYLENOL) 500 MG tablet Take 1,000 mg by mouth every 4 (four) hours as needed (pain).    [provider]  albuterol (VENTOLIN HFA) 108 (90 Base) MCG/ACT inhaler Inhale 1-2 puffs into the lungs every 6 (six) hours  as needed for wheezing or shortness of breath.    [provider]  allopurinol (ZYLOPRIM) 300 MG tablet Take 1 tablet (300 mg total) by mouth 2 (two) times daily. 06/16/18   [name], NP  atorvastatin (LIPITOR) 10 MG tablet Take 1 tablet (10 mg total) by mouth daily at 6 PM. 08/29/18   Eugenie Filler, MD  colchicine 0.6 MG tablet Take 1 tablet (0.6 mg total) by mouth daily. 04/04/18   Shelly Coss, MD  diclofenac Sodium (VOLTAREN) 1 % GEL Apply 2 g topically 4 (four) times daily. 07/09/19   [name], NP  furosemide (LASIX) 40 MG tablet Take one tablet by mouth once daily 07/25/19   [name], NP  lisinopril (ZESTRIL) 2.5 MG tablet TAKE 1 TABLET BY MOUTH EVERY DAY 04/30/19   [name], NP  Multiple Vitamin (MULTIVITAMIN WITH MINERALS) TABS tablet Take 1 tablet by mouth daily. One a Day Men's    [provider]  UNABLE TO FIND bariatric drop arm commode Dx) R54, E66.01, I50.9, M19.9 07/09/19   [name], NP  UNABLE TO FIND Bariatric tub transfer bench Dx) R54, E66.01, I50.9, M19.9 07/09/19   [name], NP  UNABLE TO FIND Bariatric lift chair. Dx) R54, E66.01, I50.9, M19.9 07/09/19   [name], NP  UNABLE TO FIND Heavy duty Kasandra Knudsen Dx) R54, E66.01, I50.9, M19.9 07/09/19   [name], NP    Allergies    Uloric [febuxostat]  Review of Systems   Review of Systems Ten systems are reviewed and are negative for acute change except as noted in the HPI  Physical Exam Updated Vital Signs BP 113/72    Pulse 88    Temp (!) 97.4 F (36.3 C) (Oral)    Resp (!) 21    Ht 5\' 7"  (1.702 m)    Wt (!) 234 kg    SpO2 100%    BMI 80.80 kg/m   Physical Exam Constitutional:      General: He is not in acute distress.    Appearance: Normal appearance. He is well-developed. He is obese. He is not ill-appearing or diaphoretic.  HENT:     Head: Normocephalic and atraumatic.     Right Ear: External ear normal.     Left Ear:  External ear normal.     Nose: Nose normal.  Eyes:     General: Vision grossly intact. Gaze aligned appropriately.     Pupils: Pupils are equal, round, and reactive to light.  Neck:     Trachea: Trachea and phonation normal. No tracheal deviation.  Pulmonary:     Effort: Pulmonary effort is normal. No respiratory distress.  Chest:     Chest wall: No deformity, tenderness or crepitus.  Abdominal:     General: There is no distension.  Palpations: Abdomen is soft.     Tenderness: There is no abdominal tenderness. There is no guarding or rebound.  Musculoskeletal:        General: Normal range of motion.     Cervical back: Normal range of motion.     Comments: Movement of the major joints of the bilateral upper extremities intact without pain.  Sensation and capillary refill intact to all fingers.  Severe gouty changes of bilateral hands which patient reports is baseline. - No tenderness with compression of the pelvis. - Bilateral lower extremities appear symmetric in size and color.  No swelling, skin break or color change.  Bilateral knees appear similar there is no swelling or erythema.  Bilateral feet appear symmetric without overlying skin changes.  Capillary refill and sensation intact to all toes.  Feet:     Right foot:     Protective Sensation: 3 sites tested. 3 sites sensed.     Skin integrity: Skin integrity normal.     Left foot:     Protective Sensation: 3 sites tested. 3 sites sensed.     Skin integrity: Skin integrity normal.  Skin:    General: Skin is warm and dry.  Neurological:     Mental Status: He is alert.     GCS: GCS eye subscore is 4. GCS verbal subscore is 5. GCS motor subscore is 6.     Comments: Speech is clear and goal oriented, follows commands Major Cranial nerves without deficit, no facial droop Moves extremities without ataxia, coordination intact  Psychiatric:        Behavior: Behavior normal.     ED Results / Procedures / Treatments    Labs (all labs ordered are listed, but only abnormal results are displayed) Labs Reviewed  CBC WITH DIFFERENTIAL/PLATELET - Abnormal; Notable for the following components:      Result Value   WBC 13.3 (*)    Neutro Abs 9.9 (*)    Monocytes Absolute 1.4 (*)    All other components within normal limits  BASIC METABOLIC PANEL - Abnormal; Notable for the following components:   Glucose, Bld 128 (*)    Calcium 8.7 (*)    All other components within normal limits  CBG MONITORING, ED - Abnormal; Notable for the following components:   Glucose-Capillary 121 (*)    All other components within normal limits  SARS CORONAVIRUS 2 (TAT 6-24 HRS)    EKG None  Radiology DG Knee Complete 4 Views Right  Result Date: 08/15/2019 CLINICAL DATA:  History of gout.  Pain. EXAM: RIGHT KNEE - COMPLETE 4+ VIEW COMPARISON:  04/19/2017 FINDINGS: Atypical positioning because of patient's size. Advanced osteoarthritis of the medial compartment and patellofemoral joint with joint space narrowing and marginal osteophytes. Small joint effusion. No evidence of fracture, erosions or visible loose bodies. IMPRESSION: Advanced osteoarthritis of the medial compartment and patellofemoral joint. Small effusion. Electronically Signed   By: Nelson Chimes M.D.   On: 08/15/2019 16:55   DG Foot Complete Left  Result Date: 08/15/2019 CLINICAL DATA:  Foot pain, history of gout EXAM: RIGHT FOOT COMPLETE - 3+ VIEW; LEFT FOOT - COMPLETE 3+ VIEW COMPARISON:  04/21/2017 FINDINGS: No fracture or dislocation of the bilateral feet. There is severe arthrosis throughout the bilateral metatarsophalangeal joints, with numerous periarticular bony erosions, not significantly changed in appearance compared to prior examination dated 04/21/2017. There is complete ankylosis of the bilateral mid feet and subtalar joints, and moderate arthrosis of the bilateral ankle mortises, again likely periarticular erosions.  Diffuse soft tissue edema of the  bilateral feet and ankles. IMPRESSION: 1.  No fracture or dislocation of the bilateral feet. 2. Severe arthrosis throughout the bilateral metatarsophalangeal joints, with numerous periarticular bony erosions, not significant changed in appearance compared to prior examination dated 04/21/2017. Findings are generally in keeping with reported history of gouty arthropathy, although unusually severe and other erosive arthropathies are general differential considerations. 3. Complete bony ankylosis of the bilateral mid feet and subtalar joints. 4. Moderate arthrosis of the bilateral ankle mortise ease, likely with periarticular erosions as well. 5. MRI is the test of choice for the assessment of inflammatory periarthritis or synovitis, as well as osteomyelitis if clinically suspected. Electronically Signed   By: Eddie Candle M.D.   On: 08/15/2019 16:59   DG Foot Complete Right  Result Date: 08/15/2019 CLINICAL DATA:  Foot pain, history of gout EXAM: RIGHT FOOT COMPLETE - 3+ VIEW; LEFT FOOT - COMPLETE 3+ VIEW COMPARISON:  04/21/2017 FINDINGS: No fracture or dislocation of the bilateral feet. There is severe arthrosis throughout the bilateral metatarsophalangeal joints, with numerous periarticular bony erosions, not significantly changed in appearance compared to prior examination dated 04/21/2017. There is complete ankylosis of the bilateral mid feet and subtalar joints, and moderate arthrosis of the bilateral ankle mortises, again likely periarticular erosions. Diffuse soft tissue edema of the bilateral feet and ankles. IMPRESSION: 1.  No fracture or dislocation of the bilateral feet. 2. Severe arthrosis throughout the bilateral metatarsophalangeal joints, with numerous periarticular bony erosions, not significant changed in appearance compared to prior examination dated 04/21/2017. Findings are generally in keeping with reported history of gouty arthropathy, although unusually severe and other erosive arthropathies  are general differential considerations. 3. Complete bony ankylosis of the bilateral mid feet and subtalar joints. 4. Moderate arthrosis of the bilateral ankle mortise ease, likely with periarticular erosions as well. 5. MRI is the test of choice for the assessment of inflammatory periarthritis or synovitis, as well as osteomyelitis if clinically suspected. Electronically Signed   By: Eddie Candle M.D.   On: 08/15/2019 16:59   VAS [place] LOWER EXTREMITY VENOUS (DVT) (ONLY [facility] & [facility] 7a-7p)  Result Date: 08/15/2019  Lower Venous DVTStudy Indications: Pain, and Edema.  Limitations: Body habitus and poor ultrasound/tissue interface. Comparison Study: 08/26/18 previous Performing Technologist: Abram Sander RVS  Examination Guidelines: A complete evaluation includes B-mode imaging, spectral Doppler, color Doppler, and power Doppler as needed of all accessible portions of each vessel. Bilateral testing is considered an integral part of a complete examination. Limited examinations for reoccurring indications may be performed as noted. The reflux portion of the exam is performed with the patient in reverse Trendelenburg.  +---------+---------------+---------+-----------+----------+--------------+  RIGHT     Compressibility Phasicity Spontaneity Properties Thrombus Aging  +---------+---------------+---------+-----------+----------+--------------+  CFV       Full            Yes       Yes                                    +---------+---------------+---------+-----------+----------+--------------+  SFJ       Full                                                             +---------+---------------+---------+-----------+----------+--------------+  FV Prox   Full                                                             +---------+---------------+---------+-----------+----------+--------------+  FV Mid    Full                                                              +---------+---------------+---------+-----------+----------+--------------+  FV Distal Full                                                             +---------+---------------+---------+-----------+----------+--------------+  PFV       Full                                                             +---------+---------------+---------+-----------+----------+--------------+  POP       Full            Yes       Yes                                    +---------+---------------+---------+-----------+----------+--------------+  PTV       Full                                                             +---------+---------------+---------+-----------+----------+--------------+  PERO                                                       Not visualized  +---------+---------------+---------+-----------+----------+--------------+   +---------+---------------+---------+-----------+----------+--------------+  LEFT      Compressibility Phasicity Spontaneity Properties Thrombus Aging  +---------+---------------+---------+-----------+----------+--------------+  CFV       Full            Yes       Yes                                    +---------+---------------+---------+-----------+----------+--------------+  SFJ       Full                                                             +---------+---------------+---------+-----------+----------+--------------+  FV Prox   Full                                                             +---------+---------------+---------+-----------+----------+--------------+  FV Mid                    Yes       Yes                                    +---------+---------------+---------+-----------+----------+--------------+  FV Distal                 Yes       Yes                                    +---------+---------------+---------+-----------+----------+--------------+  PFV       Full                                                              +---------+---------------+---------+-----------+----------+--------------+  POP       Full            Yes       Yes                                    +---------+---------------+---------+-----------+----------+--------------+  PTV       Full                                                             +---------+---------------+---------+-----------+----------+--------------+  PERO                                                       Not visualized  +---------+---------------+---------+-----------+----------+--------------+     Summary: BILATERAL: - No evidence of deep vein thrombosis seen in the lower extremities, bilaterally.   *See table(s) above for measurements and observations. Electronically signed by Harold Barban MD on 08/15/2019 at 10:30:27 PM.    Final     Procedures Procedures (including critical care time)  Medications Ordered in ED Medications  HYDROcodone-acetaminophen (NORCO/VICODIN) 5-325 MG per tablet 1 tablet (1 tablet Oral Given 08/15/19 1641)  predniSONE (DELTASONE) tablet 50 mg (50 mg Oral Given 08/15/19 1903)  oxyCODONE-acetaminophen (PERCOCET/ROXICET) 5-325 MG per tablet 1 tablet (1 tablet Oral Given 08/15/19 2115)  ketorolac (TORADOL) 15 MG/ML injection 15 mg (15 mg Intravenous Given 08/15/19 2227)    ED Course  I have reviewed the triage vital signs and the nursing notes.  Pertinent labs & imaging results that were  available during my care of the patient were reviewed by me and considered in my medical decision making (see chart for details).  Clinical Course as of Aug 14 2348  Wed Aug 15, 2019  2330 Hospitalist   [BM]    Clinical Course User Index [BM] Gari Crown   MDM Rules/Calculators/A&P                     59 year old morbidly obese male presents today for pain of the right knee and bilateral feet which he reports as from his gout.  He denies any abnormal features of his gout pain today.  He denies any injuries or recent infectious-like symptoms.   He is requesting steroid medications as these are the only things that he reports help with his gout pain.  There was a report of a fall by triage note today however per patient he did not fall today, he only slid back into his chair after attempting to get up after sitting still for 4 days due to his gout pain.  On initial evaluation he is resting comfortably no acute distress.  He has no sign of significant injury to the head neck chest abdomen or 4 extremities.  His legs appear symmetric however difficult to evaluate for swelling due to body habitus.  He is neurovascularly intact to all 4 extremities.  He has severe gouty changes of the hands and feet.  Will obtain x-rays of patient's area of pain, right knee and bilateral feet.  Additionally as patient has been sedentary for the last 4 days will obtain DVT study as well as it is difficult to valuate for swelling due to body habitus. - DG right foot:  IMPRESSION:  1. No fracture or dislocation of the bilateral feet.    2. Severe arthrosis throughout the bilateral metatarsophalangeal  joints, with numerous periarticular bony erosions, not significant  changed in appearance compared to prior examination dated  04/21/2017. Findings are generally in keeping with reported history  of gouty arthropathy, although unusually severe and other erosive  arthropathies are general differential considerations.    3. Complete bony ankylosis of the bilateral mid feet and subtalar  joints.    4. Moderate arthrosis of the bilateral ankle mortise ease, likely  with periarticular erosions as well.    5. MRI is the test of choice for the assessment of inflammatory  periarthritis or synovitis, as well as osteomyelitis if clinically  suspected.   DG Left foot:  IMPRESSION:  1. No fracture or dislocation of the bilateral feet.    2. Severe arthrosis throughout the bilateral metatarsophalangeal  joints, with numerous periarticular bony erosions, not  significant  changed in appearance compared to prior examination dated  04/21/2017. Findings are generally in keeping with reported history  of gouty arthropathy, although unusually severe and other erosive  arthropathies are general differential considerations.    3. Complete bony ankylosis of the bilateral mid feet and subtalar  joints.    4. Moderate arthrosis of the bilateral ankle mortise ease, likely  with periarticular erosions as well.    5. MRI is the test of choice for the assessment of inflammatory  periarthritis or synovitis, as well as osteomyelitis if clinically  suspected.   DG Right Knee:    IMPRESSION:  Advanced osteoarthritis of the medial compartment and patellofemoral  joint. Small effusion.   DVT Study Bilateral LE:  Summary:  BILATERAL:  - No evidence of deep vein thrombosis seen in the lower  extremities, bilaterally.  - Discussed case with Dr. Regenia Skeeter, do not feel MRI is indicated at this time.  Patient has no evidence of infection today, no overlying erythema or history of fever.  Suspect symptoms today are secondary to his gout.  Patient does have a history of diabetes but denies taking any medication to treat his diabetes and reports that it is not severe and he does not have to check blood sugars at home.  Will plan to start patient on prednisone however will check basic blood work first. - CBC shows mild leukocytosis of 13.3, suspect this is secondary to his gout flare today, he has no fever or tachycardia or overlying skin changes to suggest infection. CBG 121 BMP shows elevated glucose and calcium 8.7, no acute electrolyte abnormality or evidence of kidney injury. - Risks versus benefits of steroid medications were discussed with patient he strongly insists to start prednisone burst today.  He will be given first dose here.  Unfortunately patient's pain not improved following first dose narcotic will give a subsequent medication and reassess.   Additionally patient mentioned neck pain during a subsequent reevaluation, he describes left trapezius muscular tenderness which is reproducible to palpation.  He denies any injury of this area, he reports that while he was sitting in his chair for the last 4 days he was in an uncomfortable position which causes neck pain.  He has no neurologic complaint or midline tenderness, range of motion is appropriate for body habitus, no meningeal signs.  Per Nexus criteria there is no indication for imaging of the C-spine at this time, suspect this to be muscular, will treat symptomatically. - Patient reassessed multiple times, vital signs stable he is resting comfortably and in no acute distress.  Each time he is unable to get up out of bed and ambulate due to severe gout pain of bilateral feet and right knee.  He has been given multiple doses of narcotic pain medication as well as 1 dose IV Toradol.  He lives at home alone and is unable to care for himself or ambulate due to his pain.  Feel it is unsafe for patient to be discharged home at this time, we will seek admission for pain control as patient's pain is limiting him from his ADLs.  I consulted hospitalist and spoke to Dr. Marlowe Sax who has accepted patient to her service.  Case discussed with Dr. Regenia Skeeter who agrees with plan of care.  Note: Portions of this report may have been transcribed using voice recognition software. Every effort was made to ensure accuracy; however, inadvertent computerized transcription errors may still be present. Final Clinical Impression(s) / ED Diagnoses Final diagnoses:  Acute gout, unspecified cause, unspecified site    Rx / DC Orders ED Discharge Orders    None       Gari Crown 08/15/19 2351    Sherwood Gambler, MD 08/16/19 1626

## 2019-08-15 NOTE — Care Management (Signed)
ED CM consulted concerning patient, patient lives alone and is morbidly. Patient presents to the ED with bilateral leg pain unable to weight bear.  Patient may benefit from [facility] once pain is under control.  ED work up still in progress.

## 2019-08-15 NOTE — ED Notes (Signed)
Pt transported to XR at this time.

## 2019-08-15 NOTE — Progress Notes (Signed)
Lower extremity venous has been completed.   Preliminary results in CV Proc.   Abram Sander 08/15/2019 4:18 PM

## 2019-08-15 NOTE — ED Triage Notes (Signed)
Pt arrives from home via [facility]- per ems they were called out earlier for a fall but after getting pt up pt refused transport. Pt later called back due to chronic gout pain in bilateral legs per ems. Pt does walk at home with a walker per ems. Pt is alert and ox4. Pt placed on bariatric bed.

## 2019-08-16 ENCOUNTER — Encounter (HOSPITAL_COMMUNITY): Payer: Self-pay | Admitting: Internal Medicine

## 2019-08-16 DIAGNOSIS — I10 Essential (primary) hypertension: Secondary | ICD-10-CM

## 2019-08-16 DIAGNOSIS — R5381 Other malaise: Secondary | ICD-10-CM

## 2019-08-16 DIAGNOSIS — D72829 Elevated white blood cell count, unspecified: Secondary | ICD-10-CM | POA: Diagnosis not present

## 2019-08-16 DIAGNOSIS — L8990 Pressure ulcer of unspecified site, unspecified stage: Secondary | ICD-10-CM | POA: Insufficient documentation

## 2019-08-16 DIAGNOSIS — M109 Gout, unspecified: Secondary | ICD-10-CM

## 2019-08-16 DIAGNOSIS — L89302 Pressure ulcer of unspecified buttock, stage 2: Secondary | ICD-10-CM

## 2019-08-16 LAB — HIV ANTIBODY (ROUTINE TESTING W REFLEX): HIV Screen 4th Generation wRfx: NONREACTIVE

## 2019-08-16 LAB — GLUCOSE, CAPILLARY: Glucose-Capillary: 280 mg/dL — ABNORMAL HIGH (ref 70–99)

## 2019-08-16 LAB — HEMOGLOBIN A1C
Hgb A1c MFr Bld: 6.4 % — ABNORMAL HIGH (ref 4.8–5.6)
Mean Plasma Glucose: 136.98 mg/dL

## 2019-08-16 LAB — SEDIMENTATION RATE: Sed Rate: 117 mm/hr — ABNORMAL HIGH (ref 0–16)

## 2019-08-16 LAB — URIC ACID: Uric Acid, Serum: 9.4 mg/dL — ABNORMAL HIGH (ref 3.7–8.6)

## 2019-08-16 LAB — C-REACTIVE PROTEIN: CRP: 32.1 mg/dL — ABNORMAL HIGH (ref <1.0)

## 2019-08-16 LAB — SARS CORONAVIRUS 2 (TAT 6-24 HRS): SARS Coronavirus 2: NEGATIVE

## 2019-08-16 MED ORDER — KETOROLAC TROMETHAMINE 30 MG/ML IJ SOLN: 30.0000 mg | Freq: Four times a day (QID) | INTRAMUSCULAR | Status: DC | PRN

## 2019-08-16 MED ORDER — INSULIN ASPART 100 UNIT/ML ~~LOC~~ SOLN
0.0000 [IU] | Freq: Every day | SUBCUTANEOUS | Status: DC
  Administered 2019-08-16: 3 [IU] via SUBCUTANEOUS

## 2019-08-16 MED ORDER — DICLOFENAC SODIUM 1 % EX GEL
2.0000 g | Freq: Four times a day (QID) | CUTANEOUS | Status: DC
  Administered 2019-08-16 – 2019-08-22 (×25): 2 g via TOPICAL
  Filled 2019-08-16: qty 100

## 2019-08-16 MED ORDER — ACETAMINOPHEN 650 MG RE SUPP: 650.0000 mg | Freq: Four times a day (QID) | RECTAL | Status: DC | PRN

## 2019-08-16 MED ORDER — IBUPROFEN 200 MG PO TABS
400.0000 mg | ORAL_TABLET | Freq: Three times a day (TID) | ORAL | Status: DC
  Administered 2019-08-16 – 2019-08-22 (×20): 400 mg via ORAL
  Filled 2019-08-16 (×19): qty 2

## 2019-08-16 MED ORDER — ATORVASTATIN CALCIUM 10 MG PO TABS
10.0000 mg | ORAL_TABLET | Freq: Every day | ORAL | Status: DC
  Administered 2019-08-16 – 2019-08-22 (×7): 10 mg via ORAL
  Filled 2019-08-16 (×7): qty 1

## 2019-08-16 MED ORDER — MORPHINE SULFATE (PF) 2 MG/ML IV SOLN: 1.0000 mg | INTRAVENOUS | Status: DC | PRN

## 2019-08-16 MED ORDER — ACETAMINOPHEN 325 MG PO TABS: 650.0000 mg | ORAL_TABLET | Freq: Four times a day (QID) | ORAL | Status: DC | PRN

## 2019-08-16 MED ORDER — HYDROCODONE-ACETAMINOPHEN 5-325 MG PO TABS: 1.0000 | ORAL_TABLET | Freq: Four times a day (QID) | ORAL | Status: DC | PRN

## 2019-08-16 MED ORDER — ADULT MULTIVITAMIN W/MINERALS CH
1.0000 | ORAL_TABLET | Freq: Every day | ORAL | Status: DC
  Administered 2019-08-16 – 2019-08-22 (×7): 1 via ORAL
  Filled 2019-08-16 (×7): qty 1

## 2019-08-16 MED ORDER — ENOXAPARIN SODIUM 120 MG/0.8ML ~~LOC~~ SOLN
110.0000 mg | SUBCUTANEOUS | Status: DC
  Filled 2019-08-16: qty 0.73

## 2019-08-16 MED ORDER — COLCHICINE 0.6 MG PO TABS
0.6000 mg | ORAL_TABLET | Freq: Three times a day (TID) | ORAL | Status: DC
  Administered 2019-08-16 – 2019-08-21 (×10): 0.6 mg via ORAL
  Filled 2019-08-16 (×15): qty 1

## 2019-08-16 MED ORDER — ACETAMINOPHEN 500 MG PO TABS
500.0000 mg | ORAL_TABLET | Freq: Four times a day (QID) | ORAL | Status: DC
  Administered 2019-08-16 – 2019-08-22 (×23): 500 mg via ORAL
  Filled 2019-08-16 (×22): qty 1

## 2019-08-16 MED ORDER — INSULIN ASPART 100 UNIT/ML ~~LOC~~ SOLN
0.0000 [IU] | Freq: Three times a day (TID) | SUBCUTANEOUS | Status: DC
  Administered 2019-08-16 – 2019-08-17 (×4): 2 [IU] via SUBCUTANEOUS
  Administered 2019-08-17: 3 [IU] via SUBCUTANEOUS
  Administered 2019-08-17: 5 [IU] via SUBCUTANEOUS
  Administered 2019-08-18 – 2019-08-21 (×5): 2 [IU] via SUBCUTANEOUS

## 2019-08-16 MED ORDER — LISINOPRIL 5 MG PO TABS
2.5000 mg | ORAL_TABLET | Freq: Every day | ORAL | Status: DC
  Administered 2019-08-16 – 2019-08-22 (×7): 2.5 mg via ORAL
  Filled 2019-08-16 (×7): qty 1

## 2019-08-16 MED ORDER — PREDNISONE 20 MG PO TABS
50.0000 mg | ORAL_TABLET | Freq: Every day | ORAL | Status: DC
  Administered 2019-08-16: 50 mg via ORAL
  Filled 2019-08-16: qty 3

## 2019-08-16 MED ORDER — ALLOPURINOL 300 MG PO TABS
300.0000 mg | ORAL_TABLET | Freq: Two times a day (BID) | ORAL | Status: DC
  Administered 2019-08-16 – 2019-08-22 (×13): 300 mg via ORAL
  Filled 2019-08-16 (×13): qty 1

## 2019-08-16 MED ORDER — ALBUTEROL SULFATE (2.5 MG/3ML) 0.083% IN NEBU: 2.5000 mg | INHALATION_SOLUTION | Freq: Four times a day (QID) | RESPIRATORY_TRACT | Status: DC | PRN

## 2019-08-16 MED ORDER — ENOXAPARIN SODIUM 120 MG/0.8ML ~~LOC~~ SOLN
115.0000 mg | SUBCUTANEOUS | Status: DC
  Administered 2019-08-16 – 2019-08-22 (×7): 115 mg via SUBCUTANEOUS
  Filled 2019-08-16 (×7): qty 0.77

## 2019-08-16 MED ORDER — ASCORBIC ACID 500 MG PO TABS
500.0000 mg | ORAL_TABLET | Freq: Every day | ORAL | Status: DC
  Administered 2019-08-16 – 2019-08-22 (×7): 500 mg via ORAL
  Filled 2019-08-16 (×7): qty 1

## 2019-08-16 NOTE — Progress Notes (Signed)
PROGRESS NOTE    [name]  Ronnie Hernandez DOB: [birth year] DOA: 08/15/2019 PCP: [name], NP    Brief Narrative:  Patient was admitted to the hospital with a working diagnosis of acute gouty arthritis.  59 year old male who presented to the right knee pain, bilateral feet pain.  He does have significant past medical history for morbid obesity, gout, hypertension, dyslipidemia, asthma, type 2 diabetes mellitus and pulmonary embolism.  He reported 4 days of severe pain in both feet and right knee.  His pain has been persistent despite outpatient management with colchicine.  On his initial physical examination blood pressure 113/72, heart rate 88, respiratory rate 21, oxygen saturation 98%.  His lungs are clear to auscultation bilaterally, heart S1-2 present rhythmic, soft abdomen, positive lower extremity edema.  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 Sodium 135, potassium 4.8, chloride 99, bicarb 26, glucose 128, BUN 13, creatinine 1.0, white count 13.3, hemoglobin 14.8, hematocrit 44.9, platelets 188.  SARS COVID-19 negative.  Right knee x-ray with advanced osteoarthritis of the medial compartment and patellofemoral joint.  Small effusion.  Patient has been placed on analgesics, consulted PT and OT.   Assessment & Plan:   Principal Problem:   Gout attack Active Problems:   Morbid obesity ([place])   Hypertension   Type 2 diabetes mellitus (HCC)   Leukocytosis   Physical deconditioning   Pressure injury of skin   1. Acute right knee pain, gout flare/ osteoarthritis flare, in the setting of acute on chronic polyarticular disease. Patient with polyarticular diease affecting large and small joints, positive tophi on the right hand. Right knee with no increase local temperature or erythema.   Suspected osteoarthritis flare. Will add topical diclofenac, scheduled ibuprofen and acetaminophen, hold on prednisone for  now. Continue allopurinol and will increase colchicine to TID. Oxycodone as needed q 6 H.    Will follow on inflammatory markers, hand lesions suggesting rheumatoid arthritis. Follow with PT and OT. Out of bed to chair tid with meals.   2. HTN. Continue blood pressure control with lisinopril, holding on furosemide for now due to risk of hyperuricemia.   3. T2DM/ dyslipidemiaWill continue glucose cover and monitoring with insulin sliding scale. Patient is tolerating po well. Continue with atorvastatin.   4. Obesity. Morbid, calculated BMI is 80, patient has a high risk for hospital complications due to poor mobility. Ambulatory dysfunction, with very decreased mobility.   5. Stage 2 decubitus ulcer. Present on admission, follow up with wound care. Patient has air mattress.   6. Reactive leukocytosis. Wbc at 13,3 will follow on cell count in am. No indication for antibiotic therapy.   DVT prophylaxis: enoxaparin   Code Status:  full Family Communication: no family at the bedside  Disposition Plan/ discharge barriers:  Patient continue acutely ill.    Nutrition Status:           Skin Documentation: Pressure Injury 08/16/19 Sacrum Mid Stage 2 -  Partial thickness loss of dermis presenting as a shallow open injury with a red, pink wound bed without slough. (Active)  08/16/19 0130  Location: Sacrum  Location Orientation: Mid  Staging: Stage 2 -  Partial thickness loss of dermis presenting as a shallow open injury with a red, pink wound bed without slough.  Wound Description (Comments):   Present on Admission:       Subjective: Patient with persistent right knee pain, also has pain in bilateral ankles, pain is  moderate to sever in intensity, improved with analgesics, no nausea or vomiting, no chest pain.   Objective: Vitals:   08/16/19 0005 08/16/19 0114 08/16/19 0321 08/16/19 0928  BP: 139/88 124/64 115/65 118/63  Pulse: 89 89 94 (!) 101  Resp: 18 17 17 18   Temp: 98.3 F  (36.8 C) 98.5 F (36.9 C) 98.1 F (36.7 C) 97.6 F (36.4 C)  TempSrc:  Oral Oral Oral  SpO2: 100% 98% 96% 100%  Weight:      Height:        Intake/Output Summary (Last 24 hours) at 08/16/2019 1113 Last data filed at 08/16/2019 0300 Gross per 24 hour  Intake --  Output 250 ml  Net -250 ml   Filed Weights   08/15/19 1700  Weight: (!) 234 kg    Examination:   General: Not in pain or dyspnea, deconditioned  Neurology: Awake and alert, non focal  E ENT: no pallor, no icterus, oral mucosa moist Cardiovascular: No JVD. S1-S2 present, rhythmic, no gallops, rubs, or murmurs. No lower extremity edema. Pulmonary: vesicular breath sounds bilaterally, adequate air movement, no wheezing, rhonchi or rales. Gastrointestinal. Abdomen with no organomegaly, non tender, no rebound or guarding Skin. No rashes/ facial flushing Musculoskeletal: right hand tophi, both hands swan neck deformity on multiple digits.      Data Reviewed: I have personally reviewed following labs and imaging studies  CBC: Recent Labs  Lab 08/15/19 1801  WBC 13.3*  NEUTROABS 9.9*  HGB 14.8  HCT 44.9  MCV 98.7  PLT 0000000   Basic Metabolic Panel: Recent Labs  Lab 08/15/19 1801  NA 135  K 4.8  CL 99  CO2 26  GLUCOSE 128*  BUN 13  CREATININE 1.07  CALCIUM 8.7*   GFR: Estimated Creatinine Clearance: 140.2 mL/min (by C-G formula based on SCr of 1.07 mg/dL). Liver Function Tests: No results for input(s): AST, ALT, ALKPHOS, BILITOT, PROT, ALBUMIN in the last 168 hours. No results for input(s): LIPASE, AMYLASE in the last 168 hours. No results for input(s): AMMONIA in the last 168 hours. Coagulation Profile: No results for input(s): INR, PROTIME in the last 168 hours. Cardiac Enzymes: No results for input(s): CKTOTAL, CKMB, CKMBINDEX, TROPONINI in the last 168 hours. BNP (last 3 results) No results for input(s): PROBNP in the last 8760 hours. HbA1C: Recent Labs    08/16/19 0701  HGBA1C 6.4*    CBG: Recent Labs  Lab 08/15/19 1759  GLUCAP 121*   Lipid Profile: No results for input(s): CHOL, HDL, LDLCALC, TRIG, CHOLHDL, LDLDIRECT in the last 72 hours. Thyroid Function Tests: No results for input(s): TSH, T4TOTAL, FREET4, T3FREE, THYROIDAB in the last 72 hours. Anemia Panel: No results for input(s): VITAMINB12, FOLATE, FERRITIN, TIBC, IRON, RETICCTPCT in the last 72 hours.    Radiology Studies: I have reviewed all of the imaging during this hospital visit personally     Scheduled Meds: . allopurinol  300 mg Oral BID  . ascorbic acid  500 mg Oral Daily  . atorvastatin  10 mg Oral q1800  . enoxaparin (LOVENOX) injection  115 mg Subcutaneous Q24H  . insulin aspart  0-15 Units Subcutaneous TID WC  . insulin aspart  0-5 Units Subcutaneous QHS  . lisinopril  2.5 mg Oral Daily  . multivitamin with minerals  1 tablet Oral Daily  . predniSONE  50 mg Oral Q breakfast   Continuous Infusions:   LOS: 0 days        Aybree Lanyon Gerome Apley, MD

## 2019-08-16 NOTE — Plan of Care (Signed)

## 2019-08-16 NOTE — Progress Notes (Signed)
Occupational Therapy Evaluation Patient Details Name: Ronnie Hernandez MRN: [id number] DOB: 1961/02/09 Today's Date: 08/16/2019    History of Present Illness 59 y.o. male with medical history significant of morbid obesity (BMI 80), gout, hypertension, hyperlipidemia, PE, asthma, non-insulin-dependent type 2 diabetes presenting to the ED for evaluation of pain in his right knee and bilateral feet.  Patient reports 4-day history of severe pain in both of his feet and right knee.    Clinical Impression   PTA, pt lived at home and had assistance from his niece for bathing/LB dressing and pericare. Pt was able to ambulate to the bathroom using a cane and used a lift chair to assist with mobility. Prior to getting "weak", pt states he was independent with bed mobility. Given pt's PLOF and home support available, feel he could benefit from aggressive short term rehab at facilitate return home with [facility] services. Will follow acutely.     Follow Up Recommendations  CIR;Supervision/Assistance - 24 hour    Equipment Recommendations  3 in 1 bedside commode;Other (comment)(bariatric)    Recommendations for Other Services Rehab consult     Precautions / Restrictions Precautions Precautions: Fall      Mobility Bed Mobility Overal bed mobility: Needs Assistance Bed Mobility: Rolling Rolling: Max assist(+3)   Supine to sit: Mod assist;HOB elevated     General bed mobility comments: Pt unable to reach rail to pull over to sidelying - made more difficult by air mattress and bari bed. Once pt able to reach rail, able to help assist with rolling by holding rail and using LLE to use lower rail as fulcrum to pull self to side   Transfers                 General transfer comment: unable to attempt at this time    Balance                                           ADL either performed or assessed with clinical judgement   ADL Overall ADL's : Needs  assistance/impaired Eating/Feeding: Set up Eating/Feeding Details (indicate cue type and reason): Assist to open container; manipulates utensils without difficulty Grooming: Set up;Sitting   Upper Body Bathing: Set up;Bed level   Lower Body Bathing: Maximal assistance;Bed level   Upper Body Dressing : Minimal assistance;Bed level   Lower Body Dressing: Maximal assistance;Bed level         Toileting - Clothing Manipulation Details (indicate cue type and reason): Pt manipulating urinal; Requires total A @ bed level for pericare after BM; would most likely benefit form AE for pericare             Vision         Perception     Praxis      Pertinent Vitals/Pain Pain Assessment: Faces Pain Score: 7  Faces Pain Scale: Hurts little more Pain Location: right knee, tightness all over particularly neck Pain Descriptors / Indicators: Aching;Guarding;Tightness Pain Intervention(s): Limited activity within patient's tolerance     Hand Dominance Right   Extremity/Trunk Assessment Upper Extremity Assessment Upper Extremity Assessment: Generalized weakness;RUE deficits/detail;LUE deficits/detail RUE Deficits / Details: Swan neck deformities B hands; unable to make full composite fist but has adapted and uses hands funcitonally RUE Coordination: decreased fine motor LUE Deficits / Details: similar to R   Lower Extremity Assessment Lower Extremity Assessment: Defer to  PT evaluation RLE Deficits / Details: pt with ROM limited by body habitus. quad strength 1/5, hip abduction/adduction 2/5, ankle ROM 2-/5 with very limited mobility LLE Deficits / Details: quad strength 2/5, hamstring 2/5 hip ROm limited by body habitus, very limited ankle ROM and strength   Cervical / Trunk Assessment Cervical / Trunk Assessment: Other exceptions Cervical / Trunk Exceptions: pt with barely 5 degrees of neck rotation with pt using eyes to look around room, limited neck flexion/extension    Communication Communication Communication: No difficulties   Cognition Arousal/Alertness: Awake/alert Behavior During Therapy: WFL for tasks assessed/performed Overall Cognitive Status: Within Functional Limits for tasks assessed Area of Impairment: Safety/judgement                         Safety/Judgement: Decreased awareness of deficits         General Comments  Wound care came in to assess wound around buttock area; Required +3 to assist with roling; Pt would likely be able to mobilize better on nonair bed    Exercises Exercises: Other exercises General Exercises - Lower Extremity Ankle Circles/Pumps: AROM;Both;Supine;10 reps Short Arc Quad: AROM;Left;Supine;10 reps Heel Slides: AROM;Left;Supine;10 reps Hip ABduction/ADduction: AAROM;Both;Supine;10 reps Straight Leg Raises: AAROM;Right;Supine;10 reps Other Exercises Other Exercises: Issued level 2 theraband for BUE strengtheing Other Exercises: Educated pt how to use a sheet to lift his RLE adn assist with exerices given by PT   Shoulder Instructions      Home Living Family/patient expects to be discharged to:: Private residence Living Arrangements: Other relatives Available Help at Discharge: Family;Available PRN/intermittently Type of Home: Apartment Home Access: Level entry     Home Layout: One level     Bathroom Shower/Tub: Occupational psychologist: Handicapped height Bathroom Accessibility: Yes How Accessible: Accessible via walker Home Equipment: [place] - 4 wheels;Cane - quad;Tub bench;Hand held shower head;Wheelchair - manual;Walker - 2 wheels;Grab bars - tub/shower;Grab bars - toilet          Prior Functioning/Environment Level of Independence: Needs assistance  Gait / Transfers Assistance Needed: walks with a quad cane ADL's / Homemaking Assistance Needed: lives with niece she assists with bathing in shower, niece does all the iADLs assists with pericare after toileting             OT Problem List: Decreased strength;Decreased activity tolerance;Impaired balance (sitting and/or standing);Decreased safety awareness;Decreased knowledge of use of DME or AE;Cardiopulmonary status limiting activity;Obesity;Pain;Increased edema      OT Treatment/Interventions: Self-care/ADL training;Therapeutic exercise;Neuromuscular education;Energy conservation;DME and/or AE instruction;Therapeutic activities;Patient/family education;Balance training    OT Goals(Current goals can be found in the care plan section) Acute Rehab OT Goals Patient Stated Goal: be able to walk and return home OT Goal Formulation: With patient Time For Goal Achievement: 08/30/19 Potential to Achieve Goals: Good  OT Frequency: Min 2X/week   Barriers to D/C:            Co-evaluation              AM-PAC OT "6 Clicks" Daily Activity     Outcome Measure Help from another person eating meals?: A Little Help from another person taking care of personal grooming?: A Little Help from another person toileting, which includes using toliet, bedpan, or urinal?: A Lot Help from another person bathing (including washing, rinsing, drying)?: A Lot Help from another person to put on and taking off regular upper body clothing?: A Little Help from another person to put on and  taking off regular lower body clothing?: Total 6 Click Score: 14   End of Session    Activity Tolerance: Patient tolerated treatment well Patient left: in bed  OT Visit Diagnosis: Other abnormalities of gait and mobility (R26.89);Muscle weakness (generalized) (M62.81);Pain Pain - Right/Left: Right Pain - part of body: Leg;Knee                Time: 1400-1430 OT Time Calculation (min): 30 min Charges:  OT General Charges $OT Visit: 1 Visit OT Evaluation $OT Eval Moderate Complexity: 1 Mod OT Treatments $Self Care/Home Management : 8-22 mins  Maurie Boettcher, OT/L   Acute OT Clinical Specialist [place] Pager [phone] Office [phone]   [facility] 08/16/2019, 4:42 PM

## 2019-08-16 NOTE — Consult Note (Addendum)
WOC Nurse Consult Note: Pt receiving care [place]  Reason for Consult: pressure ulcer Wound type: fissure at the apex of the gluteal cleft -MASD Pressure Injury POA: Yes Measurement:4.5cm x 0.2cm x 0.0cm Wound bed: pink w/no drainage Periwound: intact Dressing procedure/placement/frequency: Apply foam dressing to fissure at the apex of the gluteal cleft. Change every 3 days and PRN. Apply barrier cream to lower reddened buttocks distal to gluteal cleft.  Thank you for the consult. Discussed the plan of care with the patient and beside nurse.  Alphonzo Lemmings, BSN, RN-BC, WTA-C, OCA

## 2019-08-16 NOTE — H&P (Addendum)
History and Physical    [name] DDU:[id number] DOB: [birth year] DOA: 08/15/2019  PCP: [name], NP Patient coming from: Home  Chief Complaint: Right knee and bilateral feet pain  HPI: Ronnie Hernandez is a 59 y.o. male with medical history significant of morbid obesity (BMI 80), gout, hypertension, hyperlipidemia, PE, asthma, non-insulin-dependent type 2 diabetes presenting to the ED for evaluation of pain in his right knee and bilateral feet.  Patient reports 4-day history of severe pain in both of his feet and right knee.  States it is a throbbing pain and feels similar to his prior gout attacks.  States he does not drink alcohol or consume red meat.  Reports compliance with home allopurinol.  States he took colchicine for the past few days and it did not help.  No other complaints.  ED Course: Hemodynamically stable.  WBC count 13.3. X-ray showing no fracture or dislocation of bilateral feet.  Joint severe arthrosis throughout the bilateral metatarsophalangeal joints with numerous periarticular bony erosions, not significantly changed in appearance compared to prior examination from 04/2017.  Findings are thought to be related to gouty arthropathy, although unusually severe and other erosive arthropathies and general differential considerations.  Complete bony ankylosis of the bilateral mid feet and subtalar joints.  Moderate arthrosis of the bilateral ankle mortise, likely with periarticular erosions as well. X-ray of right knee showing advanced osteoarthritis of the medial compartment and patellofemoral joint.  Small effusion. Bilateral lower extremity Dopplers negative for DVT. Patient received Norco, Toradol, Percocet, and prednisone 50 mg. Admission requested as patient has not been able to ambulate in the ED due to uncontrolled pain.  Review of Systems:  All systems reviewed and apart from history of presenting illness, are negative.  Past Medical History:    Diagnosis Date  . Arthritis    "knees, hands" (04/19/2017)  . Childhood asthma   . Diabetes type 2, uncontrolled ([place])   . Gout    "have had it in both feet, both hands, right shoulder, back" (04/19/2017)  . Hyperlipidemia   . Hypertension   . Morbidly obese ([place])   . Pulmonary embolism ([place]) 2017?    Past Surgical History:  Procedure Laterality Date  . COLONOSCOPY W/ BIOPSIES AND POLYPECTOMY  07/2011   Archie Endo 07/27/2011     reports that he quit smoking about 37 years ago. His smoking use included cigarettes. He has never used smokeless tobacco. He reports previous drug use. He reports that he does not drink alcohol.  Allergies  Allergen Reactions  . Uloric [Febuxostat] Other (See Comments)    Made the skin on feet and legs "hurt"    Family History  Problem Relation Age of Onset  . Colon cancer Maternal Grandfather 75  . Heart attack Father   . Stomach cancer Maternal Aunt 80    Prior to Admission medications   Medication Sig Start Date End Date Taking? Authorizing Provider  acetaminophen (TYLENOL) 500 MG tablet Take 1,000 mg by mouth every 4 (four) hours as needed (pain).   Yes [provider]  albuterol (VENTOLIN HFA) 108 (90 Base) MCG/ACT inhaler Inhale 1-2 puffs into the lungs every 6 (six) hours as needed for wheezing or shortness of breath.   Yes [provider]  allopurinol (ZYLOPRIM) 300 MG tablet Take 1 tablet (300 mg total) by mouth 2 (two) times daily. 06/16/18  Yes [name], NP  ascorbic acid (VITAMIN C) 500 MG tablet Take 500 mg by mouth daily.   Yes  [provider]  atorvastatin (LIPITOR) 10 MG tablet Take 1 tablet (10 mg total) by mouth daily at 6 PM. 08/29/18  Yes Eugenie Filler, MD  colchicine 0.6 MG tablet Take 1 tablet (0.6 mg total) by mouth daily. 04/04/18  Yes Shelly Coss, MD  diclofenac Sodium (VOLTAREN) 1 % GEL Apply 2 g topically 4 (four) times daily. Patient taking differently: Apply 2 g topically 4  (four) times daily as needed (pain).  07/09/19  Yes [name], NP  furosemide (LASIX) 40 MG tablet Take one tablet by mouth once daily Patient taking differently: Take 40 mg by mouth daily. Take one tablet by mouth once daily 07/25/19  Yes Eubanks, Carlos American, NP  lisinopril (ZESTRIL) 2.5 MG tablet TAKE 1 TABLET BY MOUTH EVERY DAY 04/30/19  Yes [name], NP  Menthol-Methyl Salicylate (BENGAY ARTHRITIS FORMULA EX) Apply 1 application topically 2 (two) times daily as needed (pain).   Yes [provider]  Multiple Vitamin (MULTIVITAMIN WITH MINERALS) TABS tablet Take 1 tablet by mouth daily. One a Day Men's   Yes [provider]  UNABLE TO FIND bariatric drop arm commode Dx) R54, E66.01, I50.9, M19.9 07/09/19   [name], NP  UNABLE TO FIND Bariatric tub transfer bench Dx) R54, E66.01, I50.9, M19.9 07/09/19   [name], NP  UNABLE TO FIND Bariatric lift chair. Dx) R54, E66.01, I50.9, M19.9 07/09/19   [name], NP  UNABLE TO FIND Heavy duty Cane Dx) R54, E66.01, I50.9, M19.9 07/09/19   [name], NP    Physical Exam: Vitals:   08/15/19 1745 08/16/19 0005 08/16/19 0114 08/16/19 0321  BP: 113/72 139/88 124/64 115/65  Pulse: 88 89 89 94  Resp: (!) '21 18 17 17  ' Temp:  98.3 F (36.8 C) 98.5 F (36.9 C) 98.1 F (36.7 C)  TempSrc:   Oral Oral  SpO2: 100% 100% 98% 96%  Weight:      Height:        Physical Exam  Constitutional: He is oriented to person, place, and time. No distress.  Morbidly obese  HENT:  Head: Normocephalic.  Eyes: Right eye exhibits no discharge. Left eye exhibits no discharge.  Cardiovascular: Normal rate, regular rhythm and intact distal pulses.  Pulmonary/Chest: Effort normal. No respiratory distress. He has no wheezes. He has no rales.  Abdominal: Soft. Bowel sounds are normal. There is no abdominal tenderness. There is no guarding.  Musculoskeletal:        General: Edema present.     Cervical back:  Neck supple.     Comments: Bilateral lower extremity edema Tophi noted on digits of right hand Has severe swan-neck deformity of multiple digits of bilateral hands  Neurological: He is alert and oriented to person, place, and time.  Skin: Skin is warm and dry.     Labs on Admission: I have personally reviewed following labs and imaging studies  CBC: Recent Labs  Lab 08/15/19 1801  WBC 13.3*  NEUTROABS 9.9*  HGB 14.8  HCT 44.9  MCV 98.7  PLT 086   Basic Metabolic Panel: Recent Labs  Lab 08/15/19 1801  NA 135  K 4.8  CL 99  CO2 26  GLUCOSE 128*  BUN 13  CREATININE 1.07  CALCIUM 8.7*   GFR: Estimated Creatinine Clearance: 140.2 mL/min (by C-G formula based on SCr of 1.07 mg/dL). Liver Function Tests: No results for input(s): AST, ALT, ALKPHOS, BILITOT, PROT, ALBUMIN in the last 168 hours. No results  for input(s): LIPASE, AMYLASE in the last 168 hours. No results for input(s): AMMONIA in the last 168 hours. Coagulation Profile: No results for input(s): INR, PROTIME in the last 168 hours. Cardiac Enzymes: No results for input(s): CKTOTAL, CKMB, CKMBINDEX, TROPONINI in the last 168 hours. BNP (last 3 results) No results for input(s): PROBNP in the last 8760 hours. HbA1C: No results for input(s): HGBA1C in the last 72 hours. CBG: Recent Labs  Lab 08/15/19 1759  GLUCAP 121*   Lipid Profile: No results for input(s): CHOL, HDL, LDLCALC, TRIG, CHOLHDL, LDLDIRECT in the last 72 hours. Thyroid Function Tests: No results for input(s): TSH, T4TOTAL, FREET4, T3FREE, THYROIDAB in the last 72 hours. Anemia Panel: No results for input(s): VITAMINB12, FOLATE, FERRITIN, TIBC, IRON, RETICCTPCT in the last 72 hours. Urine analysis:    Component Value Date/Time   COLORURINE YELLOW 11/15/2017 1141   APPEARANCEUR CLEAR 11/15/2017 1141   APPEARANCEUR Clear 12/05/2015 1226   LABSPEC 1.021 11/15/2017 1141   PHURINE 5.0 11/15/2017 1141   GLUCOSEU NEGATIVE 11/15/2017 1141    HGBUR SMALL (A) 11/15/2017 1141   BILIRUBINUR NEGATIVE 11/15/2017 1141   BILIRUBINUR Negative 12/05/2015 1226   KETONESUR NEGATIVE 11/15/2017 1141   PROTEINUR NEGATIVE 11/15/2017 1141   UROBILINOGEN 0.2 12/28/2013 1117   NITRITE POSITIVE (A) 11/15/2017 1141   LEUKOCYTESUR NEGATIVE 11/15/2017 1141   LEUKOCYTESUR Negative 12/05/2015 1226    Radiological Exams on Admission: DG Knee Complete 4 Views Right  Result Date: 08/15/2019 CLINICAL DATA:  History of gout.  Pain. EXAM: RIGHT KNEE - COMPLETE 4+ VIEW COMPARISON:  04/19/2017 FINDINGS: Atypical positioning because of patient's size. Advanced osteoarthritis of the medial compartment and patellofemoral joint with joint space narrowing and marginal osteophytes. Small joint effusion. No evidence of fracture, erosions or visible loose bodies. IMPRESSION: Advanced osteoarthritis of the medial compartment and patellofemoral joint. Small effusion. Electronically Signed   By: Nelson Chimes M.D.   On: 08/15/2019 16:55   DG Foot Complete Left  Result Date: 08/15/2019 CLINICAL DATA:  Foot pain, history of gout EXAM: RIGHT FOOT COMPLETE - 3+ VIEW; LEFT FOOT - COMPLETE 3+ VIEW COMPARISON:  04/21/2017 FINDINGS: No fracture or dislocation of the bilateral feet. There is severe arthrosis throughout the bilateral metatarsophalangeal joints, with numerous periarticular bony erosions, not significantly changed in appearance compared to prior examination dated 04/21/2017. There is complete ankylosis of the bilateral mid feet and subtalar joints, and moderate arthrosis of the bilateral ankle mortises, again likely periarticular erosions. Diffuse soft tissue edema of the bilateral feet and ankles. IMPRESSION: 1.  No fracture or dislocation of the bilateral feet. 2. Severe arthrosis throughout the bilateral metatarsophalangeal joints, with numerous periarticular bony erosions, not significant changed in appearance compared to prior examination dated 04/21/2017. Findings are  generally in keeping with reported history of gouty arthropathy, although unusually severe and other erosive arthropathies are general differential considerations. 3. Complete bony ankylosis of the bilateral mid feet and subtalar joints. 4. Moderate arthrosis of the bilateral ankle mortise ease, likely with periarticular erosions as well. 5. MRI is the test of choice for the assessment of inflammatory periarthritis or synovitis, as well as osteomyelitis if clinically suspected. Electronically Signed   By: Eddie Candle M.D.   On: 08/15/2019 16:59   DG Foot Complete Right  Result Date: 08/15/2019 CLINICAL DATA:  Foot pain, history of gout EXAM: RIGHT FOOT COMPLETE - 3+ VIEW; LEFT FOOT - COMPLETE 3+ VIEW COMPARISON:  04/21/2017 FINDINGS: No fracture or dislocation of the bilateral feet. There is  severe arthrosis throughout the bilateral metatarsophalangeal joints, with numerous periarticular bony erosions, not significantly changed in appearance compared to prior examination dated 04/21/2017. There is complete ankylosis of the bilateral mid feet and subtalar joints, and moderate arthrosis of the bilateral ankle mortises, again likely periarticular erosions. Diffuse soft tissue edema of the bilateral feet and ankles. IMPRESSION: 1.  No fracture or dislocation of the bilateral feet. 2. Severe arthrosis throughout the bilateral metatarsophalangeal joints, with numerous periarticular bony erosions, not significant changed in appearance compared to prior examination dated 04/21/2017. Findings are generally in keeping with reported history of gouty arthropathy, although unusually severe and other erosive arthropathies are general differential considerations. 3. Complete bony ankylosis of the bilateral mid feet and subtalar joints. 4. Moderate arthrosis of the bilateral ankle mortise ease, likely with periarticular erosions as well. 5. MRI is the test of choice for the assessment of inflammatory periarthritis or  synovitis, as well as osteomyelitis if clinically suspected. Electronically Signed   By: Eddie Candle M.D.   On: 08/15/2019 16:59   VAS [place] LOWER EXTREMITY VENOUS (DVT) (ONLY [facility] & [facility] 7a-7p)  Result Date: 08/15/2019  Lower Venous DVTStudy Indications: Pain, and Edema.  Limitations: Body habitus and poor ultrasound/tissue interface. Comparison Study: 08/26/18 previous Performing Technologist: Abram Sander RVS  Examination Guidelines: A complete evaluation includes B-mode imaging, spectral Doppler, color Doppler, and power Doppler as needed of all accessible portions of each vessel. Bilateral testing is considered an integral part of a complete examination. Limited examinations for reoccurring indications may be performed as noted. The reflux portion of the exam is performed with the patient in reverse Trendelenburg.  +---------+---------------+---------+-----------+----------+--------------+ RIGHT    CompressibilityPhasicitySpontaneityPropertiesThrombus Aging +---------+---------------+---------+-----------+----------+--------------+ CFV      Full           Yes      Yes                                 +---------+---------------+---------+-----------+----------+--------------+ SFJ      Full                                                        +---------+---------------+---------+-----------+----------+--------------+ FV Prox  Full                                                        +---------+---------------+---------+-----------+----------+--------------+ FV Mid   Full                                                        +---------+---------------+---------+-----------+----------+--------------+ FV DistalFull                                                        +---------+---------------+---------+-----------+----------+--------------+ PFV      Full                                                         +---------+---------------+---------+-----------+----------+--------------+  POP      Full           Yes      Yes                                 +---------+---------------+---------+-----------+----------+--------------+ PTV      Full                                                        +---------+---------------+---------+-----------+----------+--------------+ PERO                                                  Not visualized +---------+---------------+---------+-----------+----------+--------------+   +---------+---------------+---------+-----------+----------+--------------+ LEFT     CompressibilityPhasicitySpontaneityPropertiesThrombus Aging +---------+---------------+---------+-----------+----------+--------------+ CFV      Full           Yes      Yes                                 +---------+---------------+---------+-----------+----------+--------------+ SFJ      Full                                                        +---------+---------------+---------+-----------+----------+--------------+ FV Prox  Full                                                        +---------+---------------+---------+-----------+----------+--------------+ FV Mid                  Yes      Yes                                 +---------+---------------+---------+-----------+----------+--------------+ FV Distal               Yes      Yes                                 +---------+---------------+---------+-----------+----------+--------------+ PFV      Full                                                        +---------+---------------+---------+-----------+----------+--------------+ POP      Full           Yes      Yes                                 +---------+---------------+---------+-----------+----------+--------------+ PTV  Full                                                         +---------+---------------+---------+-----------+----------+--------------+ PERO                                                  Not visualized +---------+---------------+---------+-----------+----------+--------------+     Summary: BILATERAL: - No evidence of deep vein thrombosis seen in the lower extremities, bilaterally.   *See table(s) above for measurements and observations. Electronically signed by Harold Barban MD on 08/15/2019 at 10:30:27 PM.    Final     Assessment/Plan Principal Problem:   Gout attack Active Problems:   Hypertension   Type 2 diabetes mellitus (HCC)   Leukocytosis   Physical deconditioning   Acute gout flare: Continue prednisone and give scheduled Toradol.  Patient states Vicodin helped alleviate his pain, will continue at this time.  Continue home allopurinol.  Check uric acid level.  Does have severe swan-neck deformity of several digits of hands bilaterally concerning for inflammatory polyarthritis.  Check ESR, CRP, rheumatoid factor, and anti-CCP antibody.  Mild leukocytosis: Likely reactive.  Afebrile and no infectious signs or symptoms.  Physical deconditioning/immobility: Morbid obesity and pain secondary to acute gout flare contributing.  Pain management as above.  PT and OT evaluation in a.m.  Hypertension: Stable.  Currently normotensive.  Continue home lisinopril.  Hold Lasix at this time given acute gout flare.  Hyperlipidemia: Continue home Lipitor  Asthma: Stable.  No bronchospasm.  Continue home albuterol as needed.  Non-insulin-dependent type 2 diabetes: Check A1c.  Sliding scale insulin moderate ACHS and CBG checks.  DVT prophylaxis: Lovenox Code Status: Full code Family Communication: No family available at this time Disposition Plan: Anticipate discharge after clinical improvement. Consults called: None Admission status: It is my clinical opinion that referral for OBSERVATION is reasonable and necessary in this patient based on the  above information provided. The aforementioned taken together are felt to place the patient at high risk for further clinical deterioration. However it is anticipated that the patient may be medically stable for discharge from the hospital within 24 to 48 hours.  The medical decision making on this patient was of high complexity and the patient is at high risk for clinical deterioration, therefore this is a level 3 visit.  Shela Leff MD [facility]  If 7PM-7AM, please contact night-coverage [email or web address]  08/16/2019, 3:41 AM

## 2019-08-16 NOTE — Progress Notes (Signed)
Pt arrived on floor stable. Pressure injury on sacral, MASD along chest, abdomen, and groin. Pt pain is under control. Will continue to monitor.

## 2019-08-16 NOTE — Progress Notes (Signed)
PT Cancellation Note  Patient Details Name: ARISTIDE SHEERIN MRN: [id number] DOB: 16-Jan-1961   Cancelled Treatment:    Reason Eval/Treat Not Completed: Other (comment)(initiated session with pt however breakfast arrived and pt deferred eval until after eating. will return)   Churchill Grimsley B Abdirahim Flavell 08/16/2019, 7:48 AM Bayard Males, PT Acute Rehabilitation Services Pager: [phone] Office: [phone]

## 2019-08-16 NOTE — Evaluation (Signed)
Physical Therapy Evaluation Patient Details Name: Ronnie Hernandez MRN: [id number] DOB: 24-Jul-1960 Today's Date: 08/16/2019   History of Present Illness  59 yo admitted with bil feet and Rt knee pain due to gout. PMHx: morbid obesity, HTN, HLD, gout  Clinical Impression  Pt very pleasant and reports he normally sleeps in a bed at home but had gotten fatigued and weak and sat in lift chair for 3 days without being able to move. Pt reports he normally walks with quad cane and gets around in the house with assist of niece for ADLs and iADLs. Pt currently with Right knee pain and weakness limiting mobility with inability to even get to EOB with assist. Pt with decreased strength, rOM, transfers and function who will benefit from acute therapy to maximize mobility and independence. Pt educated for bil LE HEP at bed level and educated for need to assist staff with mobility acutely.     Follow Up Recommendations Supervision/Assistance - 24 hour;SNF    Equipment Recommendations  None recommended by PT    Recommendations for Other Services       Precautions / Restrictions Precautions Precautions: Fall Restrictions Weight Bearing Restrictions: No      Mobility  Bed Mobility Overal bed mobility: Needs Assistance Bed Mobility: Supine to Sit     Supine to sit: Mod assist;HOB elevated     General bed mobility comments: attempted to transfer toward left EOB as pt does at home. He was able to get bil feet off the bed and initiate trunk elevation but could not lift trunk from surface to get legs fully to EOB with +1 assist even with cues and HOB elevated. Assist to bring legs back to surface and in trendelenburg pt able to partially assist scooting hips toward EOB  Transfers                 General transfer comment: unable  Ambulation/Gait                Stairs            Wheelchair Mobility    Modified Rankin (Stroke Patients Only)       Balance                                              Pertinent Vitals/Pain Pain Assessment: 0-10 Pain Score: 7  Pain Location: right knee, tightness all over particularly neck Pain Descriptors / Indicators: Aching;Guarding;Tightness Pain Intervention(s): Limited activity within patient's tolerance;Monitored during session;Repositioned    Home Living Family/patient expects to be discharged to:: Private residence Living Arrangements: Other relatives Available Help at Discharge: Family;Available PRN/intermittently Type of Home: Apartment Home Access: Level entry     Home Layout: One level Home Equipment: Walker - 4 wheels;Cane - quad;Tub bench;Hand held shower head;Wheelchair - manual;Walker - 2 wheels;Grab bars - tub/shower;Grab bars - toilet      Prior Function Level of Independence: Needs assistance   Gait / Transfers Assistance Needed: walks with a quad cane  ADL's / Homemaking Assistance Needed: lives with niece she assists with bathing in shower, niece does all the iADLs assists with pericare after toileting        Hand Dominance        Extremity/Trunk Assessment   Upper Extremity Assessment Upper Extremity Assessment: Defer to OT evaluation    Lower Extremity Assessment Lower Extremity Assessment:  RLE deficits/detail;LLE deficits/detail RLE Deficits / Details: pt with ROM limited by body habitus. quad strength 1/5, hip abduction/adduction 2/5, ankle ROM 2-/5 with very limited mobility LLE Deficits / Details: quad strength 2/5, hamstring 2/5 hip ROm limited by body habitus, very limited ankle ROM and strength    Cervical / Trunk Assessment Cervical / Trunk Assessment: Other exceptions Cervical / Trunk Exceptions: pt with barely 5 degrees of neck rotation with pt using eyes to look around room, limited neck flexion/extension  Communication   Communication: No difficulties  Cognition Arousal/Alertness: Awake/alert Behavior During Therapy: WFL for tasks  assessed/performed Overall Cognitive Status: Impaired/Different from baseline Area of Impairment: Safety/judgement                         Safety/Judgement: Decreased awareness of deficits            General Comments      Exercises General Exercises - Lower Extremity Ankle Circles/Pumps: AROM;Both;Supine;10 reps Short Arc Quad: AROM;Left;Supine;10 reps Heel Slides: AROM;Left;Supine;10 reps Hip ABduction/ADduction: AAROM;Both;Supine;10 reps Straight Leg Raises: AAROM;Right;Supine;10 reps   Assessment/Plan    PT Assessment Patient needs continued PT services  PT Problem List Decreased strength;Decreased mobility;Decreased range of motion;Decreased activity tolerance;Pain;Decreased skin integrity       PT Treatment Interventions DME instruction;Therapeutic exercise;Gait training;Balance training;Functional mobility training;Therapeutic activities;Patient/family education    PT Goals (Current goals can be found in the Care Plan section)  Acute Rehab PT Goals Patient Stated Goal: be able to walk and return home PT Goal Formulation: With patient Time For Goal Achievement: 08/30/19 Potential to Achieve Goals: Fair    Frequency Min 3X/week   Barriers to discharge Decreased caregiver support      Co-evaluation               AM-PAC PT "6 Clicks" Mobility  Outcome Measure Help needed turning from your back to your side while in a flat bed without using bedrails?: Total Help needed moving from lying on your back to sitting on the side of a flat bed without using bedrails?: Total Help needed moving to and from a bed to a chair (including a wheelchair)?: Total Help needed standing up from a chair using your arms (e.g., wheelchair or bedside chair)?: Total Help needed to walk in hospital room?: Total Help needed climbing 3-5 steps with a railing? : Total 6 Click Score: 6    End of Session   Activity Tolerance: Patient limited by pain Patient left: in  bed;with call bell/phone within reach Nurse Communication: Mobility status;Need for lift equipment PT Visit Diagnosis: Other abnormalities of gait and mobility (R26.89);Muscle weakness (generalized) (M62.81)    Time: [id number] PT Time Calculation (min) (ACUTE ONLY): 32 min   Charges:   PT Evaluation $PT Eval Moderate Complexity: 1 Mod PT Treatments $Therapeutic Exercise: 8-22 mins        Mesha Schamberger P, PT Acute Rehabilitation Services Pager: [phone] Office: [phone]   Jazalynn Mireles B Adalae Baysinger 08/16/2019, 1:55 PM

## 2019-08-17 DIAGNOSIS — R5381 Other malaise: Secondary | ICD-10-CM | POA: Diagnosis not present

## 2019-08-17 DIAGNOSIS — Z743 Need for continuous supervision: Secondary | ICD-10-CM | POA: Diagnosis not present

## 2019-08-17 DIAGNOSIS — Z7401 Bed confinement status: Secondary | ICD-10-CM | POA: Diagnosis not present

## 2019-08-17 DIAGNOSIS — J45909 Unspecified asthma, uncomplicated: Secondary | ICD-10-CM | POA: Diagnosis present

## 2019-08-17 DIAGNOSIS — M2550 Pain in unspecified joint: Secondary | ICD-10-CM | POA: Diagnosis not present

## 2019-08-17 DIAGNOSIS — M109 Gout, unspecified: Secondary | ICD-10-CM | POA: Diagnosis not present

## 2019-08-17 DIAGNOSIS — L89152 Pressure ulcer of sacral region, stage 2: Secondary | ICD-10-CM | POA: Diagnosis present

## 2019-08-17 DIAGNOSIS — M175 Other unilateral secondary osteoarthritis of knee: Secondary | ICD-10-CM

## 2019-08-17 DIAGNOSIS — Z800 Family history of malignant neoplasm of digestive organs: Secondary | ICD-10-CM | POA: Diagnosis not present

## 2019-08-17 DIAGNOSIS — Z8249 Family history of ischemic heart disease and other diseases of the circulatory system: Secondary | ICD-10-CM | POA: Diagnosis not present

## 2019-08-17 DIAGNOSIS — E785 Hyperlipidemia, unspecified: Secondary | ICD-10-CM | POA: Diagnosis present

## 2019-08-17 DIAGNOSIS — Z87891 Personal history of nicotine dependence: Secondary | ICD-10-CM | POA: Diagnosis not present

## 2019-08-17 DIAGNOSIS — M1A9XX1 Chronic gout, unspecified, with tophus (tophi): Secondary | ICD-10-CM | POA: Diagnosis present

## 2019-08-17 DIAGNOSIS — I10 Essential (primary) hypertension: Secondary | ICD-10-CM | POA: Diagnosis not present

## 2019-08-17 DIAGNOSIS — Z20822 Contact with and (suspected) exposure to covid-19: Secondary | ICD-10-CM | POA: Diagnosis present

## 2019-08-17 DIAGNOSIS — E1165 Type 2 diabetes mellitus with hyperglycemia: Secondary | ICD-10-CM

## 2019-08-17 DIAGNOSIS — M130 Polyarthritis, unspecified: Secondary | ICD-10-CM | POA: Diagnosis present

## 2019-08-17 DIAGNOSIS — E1169 Type 2 diabetes mellitus with other specified complication: Secondary | ICD-10-CM | POA: Diagnosis present

## 2019-08-17 DIAGNOSIS — Z6841 Body Mass Index (BMI) 40.0 and over, adult: Secondary | ICD-10-CM | POA: Diagnosis not present

## 2019-08-17 DIAGNOSIS — Z888 Allergy status to other drugs, medicaments and biological substances status: Secondary | ICD-10-CM | POA: Diagnosis not present

## 2019-08-17 DIAGNOSIS — D72829 Elevated white blood cell count, unspecified: Secondary | ICD-10-CM | POA: Diagnosis not present

## 2019-08-17 DIAGNOSIS — R531 Weakness: Secondary | ICD-10-CM | POA: Diagnosis not present

## 2019-08-17 DIAGNOSIS — M20032 Swan-neck deformity of left finger(s): Secondary | ICD-10-CM | POA: Diagnosis present

## 2019-08-17 DIAGNOSIS — M20031 Swan-neck deformity of right finger(s): Secondary | ICD-10-CM | POA: Diagnosis present

## 2019-08-17 LAB — CBC WITH DIFFERENTIAL/PLATELET
Abs Immature Granulocytes: 0.07 10*3/uL (ref 0.00–0.07)
Basophils Absolute: 0 10*3/uL (ref 0.0–0.1)
Basophils Relative: 0 %
Eosinophils Absolute: 0 10*3/uL (ref 0.0–0.5)
Eosinophils Relative: 0 %
HCT: 43 % (ref 39.0–52.0)
Hemoglobin: 14.2 g/dL (ref 13.0–17.0)
Immature Granulocytes: 1 %
Lymphocytes Relative: 13 %
Lymphs Abs: 1.9 10*3/uL (ref 0.7–4.0)
MCH: 32.1 pg (ref 26.0–34.0)
MCHC: 33 g/dL (ref 30.0–36.0)
MCV: 97.3 fL (ref 80.0–100.0)
Monocytes Absolute: 1 10*3/uL (ref 0.1–1.0)
Monocytes Relative: 7 %
Neutro Abs: 11.7 10*3/uL — ABNORMAL HIGH (ref 1.7–7.7)
Neutrophils Relative %: 79 %
Platelets: 259 10*3/uL (ref 150–400)
RBC: 4.42 MIL/uL (ref 4.22–5.81)
RDW: 12.4 % (ref 11.5–15.5)
WBC: 14.6 10*3/uL — ABNORMAL HIGH (ref 4.0–10.5)
nRBC: 0 % (ref 0.0–0.2)

## 2019-08-17 LAB — BASIC METABOLIC PANEL
Anion gap: 12 (ref 5–15)
BUN: 31 mg/dL — ABNORMAL HIGH (ref 6–20)
CO2: 25 mmol/L (ref 22–32)
Calcium: 8.7 mg/dL — ABNORMAL LOW (ref 8.9–10.3)
Chloride: 97 mmol/L — ABNORMAL LOW (ref 98–111)
Creatinine, Ser: 1.08 mg/dL (ref 0.61–1.24)
GFR calc Af Amer: >60 mL/min (ref >60)
GFR calc non Af Amer: >60 mL/min (ref >60)
Glucose, Bld: 215 mg/dL — ABNORMAL HIGH (ref 70–99)
Potassium: 4.5 mmol/L (ref 3.5–5.1)
Sodium: 134 mmol/L — ABNORMAL LOW (ref 135–145)

## 2019-08-17 LAB — GLUCOSE, CAPILLARY
Glucose-Capillary: 222 mg/dL — ABNORMAL HIGH (ref 70–99)
Glucose-Capillary: 185 mg/dL — ABNORMAL HIGH (ref 70–99)
Glucose-Capillary: 131 mg/dL — ABNORMAL HIGH (ref 70–99)
Glucose-Capillary: 156 mg/dL — ABNORMAL HIGH (ref 70–99)

## 2019-08-17 LAB — RHEUMATOID FACTOR: Rheumatoid fact SerPl-aCnc: 21.8 IU/mL — ABNORMAL HIGH (ref 0.0–13.9)

## 2019-08-17 NOTE — NC FL2 (Signed)
[place] LEVEL OF CARE SCREENING TOOL     IDENTIFICATION  Patient Name: Ronnie Hernandez Birthdate: 1961/06/12 Sex: male Admission Date (Current Location): 08/15/2019  [facility] and [place] Number:  Herbalist and Address:  [facility], [place] [address], [place], [place] [address]      Provider Number: [id number]  Attending Physician Name and Address:  Tawni Millers,*  Relative Name and Phone Number:  Edris Sweda [phone]    Current Level of Care: Hospital Recommended Level of Care: [place] Prior Approval Number:    Date Approved/Denied:   PASRR Number: [id number]  Discharge Plan: SNF    Current Diagnoses: Patient Active Problem List   Diagnosis Date Noted  . Polyarthritis 08/17/2019  . Physical deconditioning 08/16/2019  . Pressure injury of skin 08/16/2019  . Gout attack 08/15/2019  . Moderate persistent asthma with exacerbation   . Acute respiratory failure with hypoxia ([place]) 08/26/2018  . Acute diastolic CHF (congestive heart failure) ([place]) 08/26/2018  . Left knee pain 03/31/2018  . Knee pain   . Acute gout 04/18/2017  . Pulmonary embolus ([place]) 08/05/2016  . Hyperglycemia, drug-induced 07/08/2016  . Impaired glucose tolerance 07/08/2016  . Leukocytosis 07/06/2016  . Unable to walk 07/06/2016  . Acute gouty arthritis 07/06/2016  . Abnormality of gait 02/18/2016  . Cellulitis of left lower extremity 01/27/2016  . Type 2 diabetes mellitus ([place]) 12/25/2013  . Drug-induced gout 12/25/2013  . Hyperlipidemia 08/01/2013  . Morbid obesity ([place])   . Hypertension   . Gout   . Colon cancer screening 07/27/2011  . Benign neoplasm of colon 07/27/2011    Orientation RESPIRATION BLADDER Height & Weight     Self, Time, Situation, Place  Normal Continent Weight: (!) 234 kg Height:  5\' 7"  (170.2 cm)  BEHAVIORAL SYMPTOMS/MOOD NEUROLOGICAL BOWEL NUTRITION STATUS      Incontinent Diet(see  discharge summary)  AMBULATORY STATUS COMMUNICATION OF NEEDS Skin   Extensive Assist Verbally Other (Comment)(skin fissure, flaky and dry areas)                       Personal Care Assistance Level of Assistance  Bathing, Feeding, Dressing Bathing Assistance: Maximum assistance Feeding assistance: Independent Dressing Assistance: Maximum assistance     Functional Limitations Info             SPECIAL CARE FACTORS FREQUENCY  PT (By licensed PT), OT (By licensed OT)     PT Frequency: 5 times per week OT Frequency: 5 times per week            Contractures Contractures Info: Not present    Additional Factors Info  Code Status, Allergies Code Status Info: Full Allergies Info: Uloric           Current Medications (08/17/2019):  This is the current hospital active medication list Current Facility-Administered Medications  Medication Dose Route Frequency Provider Last Rate Last Admin  . acetaminophen (TYLENOL) tablet 500 mg  500 mg Oral Q6H Arrien, Jimmy Picket, MD   500 mg at 08/17/19 1259  . albuterol (PROVENTIL) (2.5 MG/3ML) 0.083% nebulizer solution 2.5 mg  2.5 mg Inhalation Q6H PRN Shela Leff, MD      . allopurinol (ZYLOPRIM) tablet 300 mg  300 mg Oral BID Shela Leff, MD   300 mg at 08/17/19 0947  . ascorbic acid (VITAMIN C) tablet 500 mg  500 mg Oral Daily Shela Leff, MD   500 mg at  08/17/19 0946  . atorvastatin (LIPITOR) tablet 10 mg  10 mg Oral q1800 Shela Leff, MD   10 mg at 08/16/19 1812  . colchicine tablet 0.6 mg  0.6 mg Oral TID Tawni Millers, MD   0.6 mg at 08/17/19 0946  . diclofenac Sodium (VOLTAREN) 1 % topical gel 2 g  2 g Topical QID Arrien, Jimmy Picket, MD   2 g at 08/17/19 0946  . enoxaparin (LOVENOX) injection 115 mg  115 mg Subcutaneous Q24H Karren Cobble, RPH   115 mg at 08/17/19 0947  . HYDROcodone-acetaminophen (NORCO/VICODIN) 5-325 MG per tablet 1-2 tablet  1-2 tablet Oral Q6H PRN Shela Leff, MD      . ibuprofen (ADVIL) tablet 400 mg  400 mg Oral TID Tawni Millers, MD   400 mg at 08/17/19 0946  . insulin aspart (novoLOG) injection 0-15 Units  0-15 Units Subcutaneous TID WC Shela Leff, MD   3 Units at 08/17/19 1259  . insulin aspart (novoLOG) injection 0-5 Units  0-5 Units Subcutaneous QHS Shela Leff, MD   3 Units at 08/16/19 2231  . lisinopril (ZESTRIL) tablet 2.5 mg  2.5 mg Oral Daily Shela Leff, MD   2.5 mg at 08/17/19 0946  . multivitamin with minerals tablet 1 tablet  1 tablet Oral Daily Shela Leff, MD   1 tablet at 08/17/19 [id number]     Discharge Medications: Please see discharge summary for a list of discharge medications.  Relevant Imaging Results:  Relevant Lab Results:   Additional Information SS# [id number]; Will require 30 days of skilled nursing services.  Curlene Labrum, RN

## 2019-08-17 NOTE — Care Management (Signed)
08/17/2019 1424  Patient was admitted 1 day ago to [facility] for Right knee Pain, bilateral foot pain and impaired mobility due to morbid obesity.  Patient noted to live at home alone with care assistance from a niece who assists with bathing and ADL's.  Patient is at high potential for fall risk at home due to his weight and immobility complicated by pain and swelling of his knee and lower extremities and osteoarthritis.  FL2 completed in preparation for possible SNF placement as recommended by the Physical therapist dated 08/16/2019.  The hospitalist, Dr. Cathlean Sauer recommended possible CIR placement in a note today.  CM/CSW will wait to send out FL2 so that the patient can be given opportunity for possible CIR placement.  Based on the patient's morbid obesity, CIR placement may be a more possible admission.  Mia [place], RNCM

## 2019-08-17 NOTE — Plan of Care (Signed)
  Problem: Clinical Measurements: Goal: Ability to maintain clinical measurements within normal limits will improve Outcome: Progressing   Problem: Pain Managment: Goal: General experience of comfort will improve Outcome: Progressing   

## 2019-08-17 NOTE — Progress Notes (Signed)
PROGRESS NOTE    Ronnie Hernandez  [id number] DOB: July 31, 1960 DOA: 08/15/2019 PCP: [name], NP    Brief Narrative:  Patient was admitted to the hospital with a working diagnosis of acute gouty polyarthritis/ right knee osteoarthritis.  59 year old male who presented to the right knee pain, bilateral feet pain.  He does have significant past medical history for morbid obesity, gout, hypertension, dyslipidemia, asthma, type 2 diabetes mellitus and pulmonary embolism.  He reported 4 days of severe pain in both feet and right knee.  His pain has been persistent despite outpatient management with colchicine.  On his initial physical examination blood pressure 113/72, heart rate 88, respiratory rate 21, oxygen saturation 98%.  His lungs are clear to auscultation bilaterally, heart S1-2 present rhythmic, soft abdomen, positive lower extremity edema.  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 Sodium 135, potassium 4.8, chloride 99, bicarb 26, glucose 128, BUN 13, creatinine 1.0, white count 13.3, hemoglobin 14.8, hematocrit 44.9, platelets 188.  SARS COVID-19 negative.  Right knee x-ray with advanced osteoarthritis of the medial compartment and patellofemoral joint.  Small effusion.  Patient has been placed on analgesics, consulted PT and OT.   Patient not yet back to baseline, continue to have significant limitation in movement. Recommendation for inpatient rehab.     Assessment & Plan:   Principal Problem:   Gout attack Active Problems:   Morbid obesity ([place])   Hypertension   Type 2 diabetes mellitus (H[facility])   Leukocytosis   Physical deconditioning   Pressure injury of skin   1. Acute right knee pain, gout flare/ osteoarthritis flare, in the setting of acute on chronic polyarticular disease. Patient with polyarticular diease affecting large and small joints, positive tophi on the right hand. Patient with  improved symptoms but not yet back to baseline, continue to have significant limited mobility and high fall risk considering his very high BMI (morbid obesity).   Continue with topical diclofenac, scheduled ibuprofen, acetaminophen,  Colchicine, and as needed Oxycodone q 6 H.    High CRP 32 and sed rate 117.   Patient has failed outpatient therapy in recovering his mobility, considering high fall risk, will change to inpatient and will consult inpatient rehab for evaluation.    2. HTN. On lisinopril for blood pressure control, will continue to hold on furosemide for now.  3. T2DM (Hgb A1c 6,4) / dyslipidemia. Glucose cover and monitoring with insulin sliding scale. On atorvastatin.   4. Obesity. Morbid, calculated BMI is 80, patient has a high risk for hospital complications due to poor mobility. Patient with significant limited mobility, recommendation for CIR evaluation.  5. Stage 2 decubitus ulcer. Present on admission, Continue with local wound care.   6. Reactive leukocytosis. Wbc up to 14, patient now off steroids, no signs of systemic or localized infection.  DVT prophylaxis: enoxaparin   Code Status:  full Family Communication: no family at the bedside  Disposition Plan/ discharge barriers:  Patient continue acutely ill, will change to inpatient and consult inpatient rehab.       Skin Documentation: Pressure Injury 08/16/19 Sacrum Mid Stage 2 -  Partial thickness loss of dermis presenting as a shallow open injury with a red, pink wound bed without slough. Fissure at the apex of the gluteal cleft-MASD (Active)  08/16/19 0130  Location: Sacrum  Location Orientation: Mid  Staging: Stage 2 -  Partial thickness loss of dermis presenting as a shallow open injury with  a red, pink wound bed without slough.  Wound Description (Comments): Fissure at the apex of the gluteal cleft-MASD  Present on Admission: Yes     Consultants:   Inpatient rehab.     Subjective: Pain  at the right knee, has improved but not yet back to baseline, continue to have significant decreased mobility, no nausea or vomiting, no chest pain or dyspnea.   Objective: Vitals:   08/16/19 1932 08/16/19 2134 08/17/19 0510 08/17/19 0838  BP: (!) 126/106 (!) 125/54 135/68 (!) 156/59  Pulse: 86 79 69 78  Resp: 17  17 18   Temp: 98.1 F (36.7 C)  97.7 F (36.5 C) (!) 97.5 F (36.4 C)  TempSrc: Oral  Oral Oral  SpO2: 99%  98% 95%  Weight:      Height:        Intake/Output Summary (Last 24 hours) at 08/17/2019 1121 Last data filed at 08/17/2019 0900 Gross per 24 hour  Intake 480 ml  Output 1050 ml  Net -570 ml   Filed Weights   08/15/19 1700  Weight: (!) 234 kg    Examination:   General: Not in pain or dyspnea, deconditioned  Neurology: Awake and alert, non focal  E ENT: no pallor, no icterus, oral mucosa moist Cardiovascular: No JVD. S1-S2 present, rhythmic, no gallops, rubs, or murmurs. Non pitting lower extremity edema. Pulmonary: positive breath sounds bilaterally, adequate air movement, no wheezing, rhonchi or rales. Gastrointestinal. Abdomen protuberant no organomegaly, non tender, no rebound or guarding Skin. No rashes Musculoskeletal: swan neck deformities both hands, fingers. Hypertrophic bilateral, more on the right, decreased range of motion due to pain.      Data Reviewed: I have personally reviewed following labs and imaging studies  CBC: Recent Labs  Lab 08/15/19 1801 08/17/19 0522  WBC 13.3* 14.6*  NEUTROABS 9.9* 11.7*  HGB 14.8 14.2  HCT 44.9 43.0  MCV 98.7 97.3  PLT 188 [id number]   Basic Metabolic Panel: Recent Labs  Lab 08/15/19 1801 08/17/19 0522  NA 135 134*  K 4.8 4.5  CL 99 97*  CO2 26 25  GLUCOSE 128* 215*  BUN 13 31*  CREATININE 1.07 1.08  CALCIUM 8.7* 8.7*   GFR: Estimated Creatinine Clearance: 138.9 mL/min (by C-G formula based on SCr of 1.08 mg/dL). Liver Function Tests: No results for input(s): AST, ALT, ALKPHOS, BILITOT,  PROT, ALBUMIN in the last 168 hours. No results for input(s): LIPASE, AMYLASE in the last 168 hours. No results for input(s): AMMONIA in the last 168 hours. Coagulation Profile: No results for input(s): INR, PROTIME in the last 168 hours. Cardiac Enzymes: No results for input(s): CKTOTAL, CKMB, CKMBINDEX, TROPONINI in the last 168 hours. BNP (last 3 results) No results for input(s): PROBNP in the last 8760 hours. HbA1C: Recent Labs    08/16/19 0701  HGBA1C 6.4*   CBG: Recent Labs  Lab 08/15/19 1759 08/16/19 2139 08/17/19 0645  GLUCAP 121* 280* 222*   Lipid Profile: No results for input(s): CHOL, HDL, LDLCALC, TRIG, CHOLHDL, LDLDIRECT in the last 72 hours. Thyroid Function Tests: No results for input(s): TSH, T4TOTAL, FREET4, T3FREE, THYROIDAB in the last 72 hours. Anemia Panel: No results for input(s): VITAMINB12, FOLATE, FERRITIN, TIBC, IRON, RETICCTPCT in the last 72 hours.    Radiology Studies: I have reviewed all of the imaging during this hospital visit personally     Scheduled Meds: . acetaminophen  500 mg Oral Q6H  . allopurinol  300 mg Oral BID  . ascorbic acid  500 mg Oral Daily  . atorvastatin  10 mg Oral q1800  . colchicine  0.6 mg Oral TID  . diclofenac Sodium  2 g Topical QID  . enoxaparin (LOVENOX) injection  115 mg Subcutaneous Q24H  . ibuprofen  400 mg Oral TID  . insulin aspart  0-15 Units Subcutaneous TID WC  . insulin aspart  0-5 Units Subcutaneous QHS  . lisinopril  2.5 mg Oral Daily  . multivitamin with minerals  1 tablet Oral Daily   Continuous Infusions:   LOS: 0 days        Delayla Hoffmaster Gerome Apley, MD

## 2019-08-17 NOTE — Progress Notes (Signed)
BP @1932  126/106 Retake BP @2134  125/54 Dr. Kennon Holter ([facility]) notified Pt asymptomatic, will continue monitoring.

## 2019-08-17 NOTE — Plan of Care (Signed)

## 2019-08-18 DIAGNOSIS — M130 Polyarthritis, unspecified: Secondary | ICD-10-CM

## 2019-08-18 LAB — GLUCOSE, CAPILLARY
Glucose-Capillary: 140 mg/dL — ABNORMAL HIGH (ref 70–99)
Glucose-Capillary: 106 mg/dL — ABNORMAL HIGH (ref 70–99)
Glucose-Capillary: 137 mg/dL — ABNORMAL HIGH (ref 70–99)
Glucose-Capillary: 123 mg/dL — ABNORMAL HIGH (ref 70–99)

## 2019-08-18 LAB — CYCLIC CITRUL PEPTIDE ANTIBODY, IGG/IGA: CCP Antibodies IgG/IgA: 6 units (ref 0–19)

## 2019-08-18 NOTE — Progress Notes (Signed)
PROGRESS NOTE    Ronnie Hernandez  [id number] DOB: 1961/01/05 DOA: 08/15/2019 PCP: [name], NP    Brief Narrative:  Patient was admitted to the hospital with a working diagnosis of acute gouty polyarthritis/ right knee osteoarthritis.  59 year old male who presented to the right knee pain, bilateral feet pain. He does have significant past medical history for morbid obesity, gout, hypertension, dyslipidemia, asthma, type 2 diabetes mellitus and pulmonary embolism. He reported 4 days of severe pain in both feet and right knee. His pain has been persistent despite outpatient management with colchicine. On his initial physical examination blood pressure 113/72, heart rate 88, respiratory rate 21, oxygen saturation 98%. His lungs are clear to auscultation bilaterally, heart S1-2 present rhythmic, soft abdomen, positive lower extremity edema. 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 Sodium 135, potassium 4.8, chloride 99, bicarb 26, glucose 128, BUN 13, creatinine 1.0, white count 13.3, hemoglobin 14.8, hematocrit 44.9, platelets 188.SARS COVID-19 negative. Right knee x-ray with advanced osteoarthritis of the medial compartment and patellofemoral joint. Small effusion.  Patient has been placed on analgesics, consulted PT and OT.  Patient not yet back to baseline, continue to have significant limitation in movement. Recommendation for inpatient rehab.    Assessment & Plan:   Principal Problem:   Gout attack Active Problems:   Morbid obesity ([place])   Hypertension   Type 2 diabetes mellitus (HCC)   Leukocytosis   Physical deconditioning   Pressure injury of skin   Polyarthritis   1. Acute right knee pain, gout flare/ osteoarthritis flare, in the setting of acute on chronic polyarticular disease. Patient with polyarticular diease affecting large and small joints, positive tophi on the right  hand.  Persistent  significant limited mobility and high fall risk considering his very high BMI (morbid obesity).   Tolerating well topical diclofenac, scheduled ibuprofen, acetaminophen,  Colchicine, and as needed Oxycodone q 6 H.   Pending inpatient rehab evaluation.     2. HTN. Continue blood pressure control with lisinopril, will continue to hold on furosemide for now.   3. T2DM (Hgb A1c 6,4) / dyslipidemia. On insulin sliding scale for glucose cover and monitoring. Continue with atorvastatin.   4. Obesity. Morbid, calculated BMI is 80, patient has a high risk for hospital complications due to poor mobility. Patient with significant limited mobility, Follow up with CIR evaluation.  5. Stage 2 decubitus ulcer. Present on admission, Local wound care.   6. Reactive leukocytosis. Follow cell count in am, patient did received steroids on admission,  DVT prophylaxis:enoxaparin Code Status:full Family Communication:no family at the bedside Disposition Plan/ discharge barriers:Patient continue acutely ill, will change to inpatient and consult inpatient rehab.    Nutrition Status:           Skin Documentation: Pressure Injury 08/16/19 Sacrum Mid Stage 2 -  Partial thickness loss of dermis presenting as a shallow open injury with a red, pink wound bed without slough. Fissure at the apex of the gluteal cleft-MASD (Active)  08/16/19 0130  Location: Sacrum  Location Orientation: Mid  Staging: Stage 2 -  Partial thickness loss of dermis presenting as a shallow open injury with a red, pink wound bed without slough.  Wound Description (Comments): Fissure at the apex of the gluteal cleft-MASD  Present on Admission: Yes     Consultants:  Inpatient rehab.   Subjective: Patient continue to feel better but not yet back to baseline, continue to have limited  mobility, no nausea or vomiting, no chest pain or dyspnea. Tolerating po well.   Objective: Vitals:   08/17/19  1951 08/18/19 0631 08/18/19 0808 08/18/19 1229  BP: 124/65 135/69 (!) 139/59 (!) 122/57  Pulse: 74 72 75 80  Resp:   16 18  Temp: 98.1 F (36.7 C) 97.6 F (36.4 C) 97.7 F (36.5 C) 98.3 F (36.8 C)  TempSrc: Oral Oral Oral Oral  SpO2: 93% 95% 97% 95%  Weight:      Height:        Intake/Output Summary (Last 24 hours) at 08/18/2019 1248 Last data filed at 08/18/2019 0900 Gross per 24 hour  Intake 480 ml  Output 1700 ml  Net -1220 ml   Filed Weights   08/15/19 1700  Weight: (!) 234 kg    Examination:   General: Not in pain or dyspnea,. Deconditioned  Neurology: Awake and alert, non focal  E ENT: no pallor, no icterus, oral mucosa moist Cardiovascular: No JVD. S1-S2 present, rhythmic, no gallops, rubs, or murmurs. Non pitting lower extremity edema. Pulmonary: positive breath sounds bilaterally, adequate air movement, no wheezing, rhonchi or rales. Gastrointestinal. Abdomen protuberant with no organomegaly, non tender, no rebound or guarding Skin. No rashes Musculoskeletal: bilateral knee hypertrophy and swan neck deformities in fingers bilaterally.      Data Reviewed: I have personally reviewed following labs and imaging studies  CBC: Recent Labs  Lab 08/15/19 1801 08/17/19 0522  WBC 13.3* 14.6*  NEUTROABS 9.9* 11.7*  HGB 14.8 14.2  HCT 44.9 43.0  MCV 98.7 97.3  PLT 188 [id number]   Basic Metabolic Panel: Recent Labs  Lab 08/15/19 1801 08/17/19 0522  NA 135 134*  K 4.8 4.5  CL 99 97*  CO2 26 25  GLUCOSE 128* 215*  BUN 13 31*  CREATININE 1.07 1.08  CALCIUM 8.7* 8.7*   GFR: Estimated Creatinine Clearance: 138.9 mL/min (by C-G formula based on SCr of 1.08 mg/dL). Liver Function Tests: No results for input(s): AST, ALT, ALKPHOS, BILITOT, PROT, ALBUMIN in the last 168 hours. No results for input(s): LIPASE, AMYLASE in the last 168 hours. No results for input(s): AMMONIA in the last 168 hours. Coagulation Profile: No results for input(s): INR, PROTIME in the  last 168 hours. Cardiac Enzymes: No results for input(s): CKTOTAL, CKMB, CKMBINDEX, TROPONINI in the last 168 hours. BNP (last 3 results) No results for input(s): PROBNP in the last 8760 hours. HbA1C: Recent Labs    08/16/19 0701  HGBA1C 6.4*   CBG: Recent Labs  Lab 08/17/19 1202 08/17/19 1638 08/17/19 2137 08/18/19 0640 08/18/19 1152  GLUCAP 185* 131* 156* 140* 106*   Lipid Profile: No results for input(s): CHOL, HDL, LDLCALC, TRIG, CHOLHDL, LDLDIRECT in the last 72 hours. Thyroid Function Tests: No results for input(s): TSH, T4TOTAL, FREET4, T3FREE, THYROIDAB in the last 72 hours. Anemia Panel: No results for input(s): VITAMINB12, FOLATE, FERRITIN, TIBC, IRON, RETICCTPCT in the last 72 hours.    Radiology Studies: I have reviewed all of the imaging during this hospital visit personally     Scheduled Meds: . acetaminophen  500 mg Oral Q6H  . allopurinol  300 mg Oral BID  . ascorbic acid  500 mg Oral Daily  . atorvastatin  10 mg Oral q1800  . colchicine  0.6 mg Oral TID  . diclofenac Sodium  2 g Topical QID  . enoxaparin (LOVENOX) injection  115 mg Subcutaneous Q24H  . ibuprofen  400 mg Oral TID  . insulin aspart  0-15 Units  Subcutaneous TID WC  . insulin aspart  0-5 Units Subcutaneous QHS  . lisinopril  2.5 mg Oral Daily  . multivitamin with minerals  1 tablet Oral Daily   Continuous Infusions:   LOS: 1 day        Kennan Detter Gerome Apley, MD

## 2019-08-19 LAB — BASIC METABOLIC PANEL
Anion gap: 9 (ref 5–15)
BUN: 28 mg/dL — ABNORMAL HIGH (ref 6–20)
CO2: 25 mmol/L (ref 22–32)
Calcium: 8.5 mg/dL — ABNORMAL LOW (ref 8.9–10.3)
Chloride: 100 mmol/L (ref 98–111)
Creatinine, Ser: 0.91 mg/dL (ref 0.61–1.24)
GFR calc Af Amer: >60 mL/min (ref >60)
GFR calc non Af Amer: >60 mL/min (ref >60)
Glucose, Bld: 119 mg/dL — ABNORMAL HIGH (ref 70–99)
Potassium: 4.7 mmol/L (ref 3.5–5.1)
Sodium: 134 mmol/L — ABNORMAL LOW (ref 135–145)

## 2019-08-19 LAB — GLUCOSE, CAPILLARY
Glucose-Capillary: 117 mg/dL — ABNORMAL HIGH (ref 70–99)
Glucose-Capillary: 110 mg/dL — ABNORMAL HIGH (ref 70–99)
Glucose-Capillary: 116 mg/dL — ABNORMAL HIGH (ref 70–99)
Glucose-Capillary: 132 mg/dL — ABNORMAL HIGH (ref 70–99)

## 2019-08-19 LAB — CBC WITH DIFFERENTIAL/PLATELET
Abs Immature Granulocytes: 0.02 10*3/uL (ref 0.00–0.07)
Basophils Absolute: 0 10*3/uL (ref 0.0–0.1)
Basophils Relative: 0 %
Eosinophils Absolute: 0.3 10*3/uL (ref 0.0–0.5)
Eosinophils Relative: 4 %
HCT: 43.3 % (ref 39.0–52.0)
Hemoglobin: 13.9 g/dL (ref 13.0–17.0)
Immature Granulocytes: 0 %
Lymphocytes Relative: 42 %
Lymphs Abs: 2.9 10*3/uL (ref 0.7–4.0)
MCH: 31.7 pg (ref 26.0–34.0)
MCHC: 32.1 g/dL (ref 30.0–36.0)
MCV: 98.6 fL (ref 80.0–100.0)
Monocytes Absolute: 0.6 10*3/uL (ref 0.1–1.0)
Monocytes Relative: 9 %
Neutro Abs: 3 10*3/uL (ref 1.7–7.7)
Neutrophils Relative %: 45 %
Platelets: 288 10*3/uL (ref 150–400)
RBC: 4.39 MIL/uL (ref 4.22–5.81)
RDW: 12.5 % (ref 11.5–15.5)
WBC: 6.8 10*3/uL (ref 4.0–10.5)
nRBC: 0 % (ref 0.0–0.2)

## 2019-08-19 NOTE — Progress Notes (Signed)
PROGRESS NOTE    Ronnie Hernandez  [id number] DOB: 12-Nov-1960 DOA: 08/15/2019 PCP: [name], NP    Brief Narrative:  Patient was admitted to the hospital with a working diagnosis of acute goutypolyarthritis/ right knee osteoarthritis.  59 year old male who presented to the right knee pain, bilateral feet pain. He does have significant past medical history for morbid obesity, gout, hypertension, dyslipidemia, asthma, type 2 diabetes mellitus and pulmonary embolism. He reported 4 days of severe pain in both feet and right knee. His pain has been persistent despite outpatient management with colchicine. On his initial physical examination blood pressure 113/72, heart rate 88, respiratory rate 21, oxygen saturation 98%. His lungs are clear to auscultation bilaterally, heart S1-2 present rhythmic, soft abdomen, positive lower extremity edema. 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 Sodium 135, potassium 4.8, chloride 99, bicarb 26, glucose 128, BUN 13, creatinine 1.0, white count 13.3, hemoglobin 14.8, hematocrit 44.9, platelets 188.SARS COVID-19 negative. Right knee x-ray with advanced osteoarthritis of the medial compartment and patellofemoral joint. Small effusion.  Patient has been placed on analgesics, consulted PT and OT.  Patient not yet back to baseline, continue to have significant limitation in movement. Recommendation for inpatient rehab.    Assessment & Plan:   Principal Problem:   Gout attack Active Problems:   Morbid obesity ([place])   Hypertension   Type 2 diabetes mellitus (HCC)   Leukocytosis   Physical deconditioning   Pressure injury of skin   Polyarthritis    1. Acute right knee pain, gout flare/ osteoarthritis flare, in the setting of acute on chronic polyarticular disease. Patient with polyarticular diease affecting large and small joints, positive tophi on the right  hand.  Improved mobility, but continue to be high fall risk considering his very high BMI (morbid obesity).  Continue withtopical diclofenac, scheduled ibuprofen,acetaminophen, Colchicine, and as neededOxycodone q 6 H.   Disposition inpatient rehab, pending consultation.    2. HTN.Blood pressure control with lisinopril ( on hold furosemide)  3. T2DM(Hgb A1c 6,4)/ dyslipidemia. Continue with insulin sliding scale for glucose cover and monitoring.Onatorvastatin.   4. Obesity. Morbid, calculated BMI is 80, patient has a high risk for hospital complications due to poor mobility.Follow with CIR evaluation.  5. Stage 2 decubitus ulcer. Present on admission,Continue with local  wound care.  6. Reactive leukocytosis. resolved with wbc down to 6,8.  DVT prophylaxis:enoxaparin Code Status:full Family Communication:no family at the bedside Disposition Plan/ discharge barriers:Patient continue acutely ill, will change to inpatient and consult inpatient rehab.   Nutrition Status:           Skin Documentation: Pressure Injury 08/16/19 Sacrum Mid Stage 2 -  Partial thickness loss of dermis presenting as a shallow open injury with a red, pink wound bed without slough. Fissure at the apex of the gluteal cleft-MASD (Active)  08/16/19 0130  Location: Sacrum  Location Orientation: Mid  Staging: Stage 2 -  Partial thickness loss of dermis presenting as a shallow open injury with a red, pink wound bed without slough.  Wound Description (Comments): Fissure at the apex of the gluteal cleft-MASD  Present on Admission: Yes      Subjective: Patient is feeling better but not yet back to baseline, continue to have limited motion and polyarticular pain. Hoping to be transferred to inpatient rehab.   Objective: Vitals:   08/18/19 1229 08/18/19 2110 08/19/19 0315 08/19/19 0759  BP: (!) 122/57 (!) 106/59 122/70 (!) 105/56  Pulse: 80 83 79 79  Resp: 18   18  Temp:  98.3 F (36.8 C) 97.7 F (36.5 C)  (!) 97.5 F (36.4 C)  TempSrc: Oral Oral  Oral  SpO2: 95% 100% 95% 99%  Weight:      Height:        Intake/Output Summary (Last 24 hours) at 08/19/2019 1256 Last data filed at 08/19/2019 [id number] Gross per 24 hour  Intake 480 ml  Output 1900 ml  Net -1420 ml   Filed Weights   08/15/19 1700  Weight: (!) 234 kg    Examination:   General: Not in pain or dyspnea, deconditioned  Neurology: Awake and alert, non focal  E ENT: no pallor, no icterus, oral mucosa moist Cardiovascular: No JVD. S1-S2 present, rhythmic, no gallops, rubs, or murmurs. No lower extremity edema. Pulmonary: positive breath sounds bilaterally, adequate air movement, no wheezing, rhonchi or rales. Gastrointestinal. Abdomen protuberant with, no organomegaly, non tender, no rebound or guarding Skin. No rashes Musculoskeletal: hypertrophic knees bilaterally and swan neck deformities in his hands.      Data Reviewed: I have personally reviewed following labs and imaging studies  CBC: Recent Labs  Lab 08/15/19 1801 08/17/19 0522 08/19/19 0531  WBC 13.3* 14.6* 6.8  NEUTROABS 9.9* 11.7* 3.0  HGB 14.8 14.2 13.9  HCT 44.9 43.0 43.3  MCV 98.7 97.3 98.6  PLT 188 259 [id number]   Basic Metabolic Panel: Recent Labs  Lab 08/15/19 1801 08/17/19 0522 08/19/19 0531  NA 135 134* 134*  K 4.8 4.5 4.7  CL 99 97* 100  CO2 26 25 25   GLUCOSE 128* 215* 119*  BUN 13 31* 28*  CREATININE 1.07 1.08 0.91  CALCIUM 8.7* 8.7* 8.5*   GFR: Estimated Creatinine Clearance: 164.8 mL/min (by C-G formula based on SCr of 0.91 mg/dL). Liver Function Tests: No results for input(s): AST, ALT, ALKPHOS, BILITOT, PROT, ALBUMIN in the last 168 hours. No results for input(s): LIPASE, AMYLASE in the last 168 hours. No results for input(s): AMMONIA in the last 168 hours. Coagulation Profile: No results for input(s): INR, PROTIME in the last 168 hours. Cardiac Enzymes: No results for input(s): CKTOTAL, CKMB,  CKMBINDEX, TROPONINI in the last 168 hours. BNP (last 3 results) No results for input(s): PROBNP in the last 8760 hours. HbA1C: No results for input(s): HGBA1C in the last 72 hours. CBG: Recent Labs  Lab 08/18/19 1152 08/18/19 1539 08/18/19 2110 08/19/19 0625 08/19/19 1134  GLUCAP 106* 137* 123* 117* 110*   Lipid Profile: No results for input(s): CHOL, HDL, LDLCALC, TRIG, CHOLHDL, LDLDIRECT in the last 72 hours. Thyroid Function Tests: No results for input(s): TSH, T4TOTAL, FREET4, T3FREE, THYROIDAB in the last 72 hours. Anemia Panel: No results for input(s): VITAMINB12, FOLATE, FERRITIN, TIBC, IRON, RETICCTPCT in the last 72 hours.    Radiology Studies: I have reviewed all of the imaging during this hospital visit personally     Scheduled Meds: . acetaminophen  500 mg Oral Q6H  . allopurinol  300 mg Oral BID  . ascorbic acid  500 mg Oral Daily  . atorvastatin  10 mg Oral q1800  . colchicine  0.6 mg Oral TID  . diclofenac Sodium  2 g Topical QID  . enoxaparin (LOVENOX) injection  115 mg Subcutaneous Q24H  . ibuprofen  400 mg Oral TID  . insulin aspart  0-15 Units Subcutaneous TID WC  . insulin aspart  0-5 Units Subcutaneous QHS  . lisinopril  2.5 mg Oral Daily  . multivitamin  with minerals  1 tablet Oral Daily   Continuous Infusions:   LOS: 2 days        Brittan Mapel Gerome Apley, MD

## 2019-08-19 NOTE — Plan of Care (Signed)
  Problem: Pain Managment: Goal: General experience of comfort will improve Outcome: Progressing   Problem: Safety: Goal: Ability to remain free from injury will improve Outcome: Progressing   Problem: Skin Integrity: Goal: Risk for impaired skin integrity will decrease Outcome: Progressing   

## 2019-08-20 LAB — GLUCOSE, CAPILLARY
Glucose-Capillary: 122 mg/dL — ABNORMAL HIGH (ref 70–99)
Glucose-Capillary: 111 mg/dL — ABNORMAL HIGH (ref 70–99)
Glucose-Capillary: 130 mg/dL — ABNORMAL HIGH (ref 70–99)
Glucose-Capillary: 112 mg/dL — ABNORMAL HIGH (ref 70–99)

## 2019-08-20 NOTE — Plan of Care (Signed)
  Problem: Pain Managment: Goal: General experience of comfort will improve Outcome: Progressing   Problem: Safety: Goal: Ability to remain free from injury will improve Outcome: Progressing   Problem: Skin Integrity: Goal: Risk for impaired skin integrity will decrease Outcome: Progressing   

## 2019-08-20 NOTE — Progress Notes (Addendum)
Inpatient Rehabilitation Admissions Coordinator  Inpatient rehab consult received. I met with patient at bedside for rehab assessment. We discussed goals and expectations of an possible inpt rehab admit. He prefers [facility]. I will begin insurance authorization with [employer] and follow up tomorrow.  Barbara Boyette, RN, MSN Rehab Admissions Coordinator [phone] 08/20/2019 11:25 AM  

## 2019-08-20 NOTE — Progress Notes (Signed)
PROGRESS NOTE    AYODEJI THI  [id number] DOB: 1961/05/27 DOA: 08/15/2019 PCP: [name], NP    Brief Narrative:  Patient was admitted to the hospital with a working diagnosis of acute goutypolyarthritis/ right knee osteoarthritis.  59 year old male who presented to the right knee pain, bilateral feet pain. He does have significant past medical history for morbid obesity, gout, hypertension, dyslipidemia, asthma, type 2 diabetes mellitus and pulmonary embolism. He reported 4 days of severe pain in both feet and right knee. His pain has been persistent despite outpatient management with colchicine. On his initial physical examination blood pressure 113/72, heart rate 88, respiratory rate 21, oxygen saturation 98%. His lungs are clear to auscultation bilaterally, heart S1-2 present rhythmic, soft abdomen, positive lower extremity edema. 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 Sodium 135, potassium 4.8, chloride 99, bicarb 26, glucose 128, BUN 13, creatinine 1.0, white count 13.3, hemoglobin 14.8, hematocrit 44.9, platelets 188.SARS COVID-19 negative. Right knee x-ray with advanced osteoarthritis of the medial compartment and patellofemoral joint. Small effusion.  Patient has been placed on analgesics, consulted PT and OT.  Patient not yet back to baseline, continue to have significant limitation in movement. Recommendation for inpatient rehab.   Assessment & Plan:   Principal Problem:   Gout attack Active Problems:   Morbid obesity ([place])   Hypertension   Type 2 diabetes mellitus (HCC)   Leukocytosis   Physical deconditioning   Pressure injury of skin   Polyarthritis    1. Acute right knee pain, gout flare/ osteoarthritis flare, in the setting of acute on chronic polyarticular disease. Patient with polyarticular diease affecting large and small joints, positive tophi on the right  hand.  Patient not yet back to his baseline and continue to be high fall risk considering his very high BMI > than 40.  Medical therapy withtopical diclofenac, along with scheduled ibuprofen,acetaminophen, colchicine, and as neededOxycodone q 6 H.  Continue PT, OT and follow up with inpatient rehab recommendations possible dc to CIR.   2. HTN.On lisinopril for blood pressure control. ( on hold furosemide)  3. T2DM(Hgb A1c 6,4)/ dyslipidemia.capillary glucose this am 122, will continue with insulin sliding scalefor glucose cover and monitoring.Continue with atorvastatin.   4. Obesity class 3, calculated BMI is 80, patient has a high risk for hospital complications due to poor mobility.Patient will benefit from inpatient rehab evaluation possible admissio.  5. Stage 2 decubitus ulcer. Present on admission,Local  wound care.  6. Reactive leukocytosis.clinically resolved.   DVT prophylaxis:enoxaparin Code Status:full Family Communication:no family at the bedside Disposition Plan/ discharge barriers: Patient comes from home, barrier for discharge is severe ambulatory dysfunction due to acute polyarticular disease, high fall risk considering BMI more than 40, pending authorization for inpatient rehab.    Skin Documentation: Pressure Injury 08/16/19 Sacrum Mid Stage 2 -  Partial thickness loss of dermis presenting as a shallow open injury with a red, pink wound bed without slough. Fissure at the apex of the gluteal cleft-MASD (Active)  08/16/19 0130  Location: Sacrum  Location Orientation: Mid  Staging: Stage 2 -  Partial thickness loss of dermis presenting as a shallow open injury with a red, pink wound bed without slough.  Wound Description (Comments): Fissure at the apex of the gluteal cleft-MASD  Present on Admission: Yes     Consultants:   Inpatient rehab.    Subjective: Patient continue to have polyarticular pain, clinically is improving but not  yet back to baseline, continue to be unsteady and fall risk. No nausea or vomiting. He is willing to be admitted to SNF.   Objective: Vitals:   08/19/19 1345 08/19/19 1940 08/20/19 0424 08/20/19 0731  BP: 108/66 (!) 121/53 128/61 131/68  Pulse: 80 77 77 75  Resp: 18   18  Temp: 98.2 F (36.8 C) 97.8 F (36.6 C) 97.6 F (36.4 C) 97.8 F (36.6 C)  TempSrc: Oral Oral Oral Oral  SpO2: 99% 96% 94% 99%  Weight:      Height:        Intake/Output Summary (Last 24 hours) at 08/20/2019 1233 Last data filed at 08/20/2019 1132 Gross per 24 hour  Intake 240 ml  Output 2525 ml  Net -2285 ml   Filed Weights   08/15/19 1700  Weight: (!) 234 kg    Examination:   General: deconditioned  Neurology: Awake and alert, non focal  E ENT: no pallor, no icterus, oral mucosa moist Cardiovascular: No JVD. S1-S2 present, rhythmic, no gallops, rubs, or murmurs. No lower extremity edema. Pulmonary: positive breath sounds bilaterally, adequate air movement, no wheezing, rhonchi or rales. Gastrointestinal. Abdomen protuberant with no organomegaly, non tender, no rebound or guarding Skin. No rashes Musculoskeletal: bilateral knee hypertrophy and swan neck deformity of fingers in hands.      Data Reviewed: I have personally reviewed following labs and imaging studies  CBC: Recent Labs  Lab 08/15/19 1801 08/17/19 0522 08/19/19 0531  WBC 13.3* 14.6* 6.8  NEUTROABS 9.9* 11.7* 3.0  HGB 14.8 14.2 13.9  HCT 44.9 43.0 43.3  MCV 98.7 97.3 98.6  PLT 188 259 [id number]   Basic Metabolic Panel: Recent Labs  Lab 08/15/19 1801 08/17/19 0522 08/19/19 0531  NA 135 134* 134*  K 4.8 4.5 4.7  CL 99 97* 100  CO2 26 25 25   GLUCOSE 128* 215* 119*  BUN 13 31* 28*  CREATININE 1.07 1.08 0.91  CALCIUM 8.7* 8.7* 8.5*   GFR: Estimated Creatinine Clearance: 164.8 mL/min (by C-G formula based on SCr of 0.91 mg/dL). Liver Function Tests: No results for input(s): AST, ALT, ALKPHOS, BILITOT, PROT, ALBUMIN in the  last 168 hours. No results for input(s): LIPASE, AMYLASE in the last 168 hours. No results for input(s): AMMONIA in the last 168 hours. Coagulation Profile: No results for input(s): INR, PROTIME in the last 168 hours. Cardiac Enzymes: No results for input(s): CKTOTAL, CKMB, CKMBINDEX, TROPONINI in the last 168 hours. BNP (last 3 results) No results for input(s): PROBNP in the last 8760 hours. HbA1C: No results for input(s): HGBA1C in the last 72 hours. CBG: Recent Labs  Lab 08/19/19 0625 08/19/19 1134 08/19/19 1645 08/19/19 2054 08/20/19 0634  GLUCAP 117* 110* 116* 132* 122*   Lipid Profile: No results for input(s): CHOL, HDL, LDLCALC, TRIG, CHOLHDL, LDLDIRECT in the last 72 hours. Thyroid Function Tests: No results for input(s): TSH, T4TOTAL, FREET4, T3FREE, THYROIDAB in the last 72 hours. Anemia Panel: No results for input(s): VITAMINB12, FOLATE, FERRITIN, TIBC, IRON, RETICCTPCT in the last 72 hours.    Radiology Studies: I have reviewed all of the imaging during this hospital visit personally     Scheduled Meds: . acetaminophen  500 mg Oral Q6H  . allopurinol  300 mg Oral BID  . ascorbic acid  500 mg Oral Daily  . atorvastatin  10 mg Oral q1800  . colchicine  0.6 mg Oral TID  . diclofenac Sodium  2 g Topical QID  . enoxaparin (LOVENOX) injection  115 mg Subcutaneous Q24H  . ibuprofen  400 mg Oral TID  . insulin aspart  0-15 Units Subcutaneous TID WC  . insulin aspart  0-5 Units Subcutaneous QHS  . lisinopril  2.5 mg Oral Daily  . multivitamin with minerals  1 tablet Oral Daily   Continuous Infusions:   LOS: 3 days        Kiyona Mcnall Gerome Apley, MD

## 2019-08-20 NOTE — Progress Notes (Signed)
Physical Therapy Treatment Patient Details Name: Ronnie Hernandez MRN: [id number] DOB: December 11, 1960 Today's Date: 08/20/2019    History of Present Illness 59 y.o. male with medical history significant of morbid obesity (BMI 80), gout, hypertension, hyperlipidemia, PE, asthma, non-insulin-dependent type 2 diabetes presenting to the ED for evaluation of pain in his right knee and bilateral feet.  Patient reports 4-day history of severe pain in both of his feet and right knee.     PT Comments    Pt very pleasant and reports no significant pain in legs today with increased movement of bil LE and able to lift legs off surface today. Pt incontinent of stool on arrival and required assist for pericare in bed and in standing. Pt was able to achieve standing today with use of RW and bil knees blocked and given current progression CIR is appropriate to achieve mobility for return home. Will continue to follow.     Follow Up Recommendations  CIR;Supervision/Assistance - 24 hour     Equipment Recommendations  None recommended by PT    Recommendations for Other Services       Precautions / Restrictions Precautions Precautions: Fall Precaution Comments: >500#    Mobility  Bed Mobility Overal bed mobility: Needs Assistance Bed Mobility: Supine to Sit;Sit to Supine Rolling: Max assist   Supine to sit: Mod assist;HOB elevated Sit to supine: Max assist;+2 for safety/equipment   General bed mobility comments: attempted to roll to left with assist and rail with pt able to partially roll but could not clear sacrum with mod assist. Pt then transitioned to sitting from supine with HOB 20 degrees and physical assist to elevate trunk and don shoes. Pt with assist of 3 to return to supine with max assist to lift bil LE and assist to control trunk. min +3 assist to slide to [facility]  Transfers Overall transfer level: Needs assistance   Transfers: Sit to/from Stand Sit to Stand: Min assist;From elevated  surface         General transfer comment: min +2 assist with bil knees blocked and reliance on rail and RW to stand. pt able to stand grossly 3 min for pericare prior to return to sitting  Ambulation/Gait                 Stairs             Wheelchair Mobility    Modified Rankin (Stroke Patients Only)       Balance Overall balance assessment: Needs assistance   Sitting balance-Leahy Scale: Fair Sitting balance - Comments: pt needs cues and assist to maintain upright EOb as tendency for posterior bias     Standing balance-Leahy Scale: Poor Standing balance comment: reliant on bil forearm support on RW to stand                            Cognition Arousal/Alertness: Awake/alert Behavior During Therapy: WFL for tasks assessed/performed Overall Cognitive Status: Within Functional Limits for tasks assessed                                        Exercises General Exercises - Lower Extremity Heel Slides: AROM;Supine;10 reps;Both Hip ABduction/ADduction: Both;Supine;10 reps;AROM(limited to knee flexion due to body habitus) Straight Leg Raises: Supine;10 reps;AROM;Both    General Comments        Pertinent Vitals/Pain Pain  Score: 5  Pain Location: neck and general aching Pain Descriptors / Indicators: Aching Pain Intervention(s): Limited activity within patient's tolerance;Monitored during session;Repositioned    Home Living                      Prior Function            PT Goals (current goals can now be found in the care plan section) Progress towards PT goals: Progressing toward goals    Frequency    Min 3X/week      PT Plan Current plan remains appropriate    Co-evaluation              AM-PAC PT "6 Clicks" Mobility   Outcome Measure  Help needed turning from your back to your side while in a flat bed without using bedrails?: A Lot Help needed moving from lying on your back to sitting on the  side of a flat bed without using bedrails?: A Lot Help needed moving to and from a bed to a chair (including a wheelchair)?: A Lot Help needed standing up from a chair using your arms (e.g., wheelchair or bedside chair)?: A Lot Help needed to walk in hospital room?: Total Help needed climbing 3-5 steps with a railing? : Total 6 Click Score: 10    End of Session   Activity Tolerance: Patient tolerated treatment well Patient left: in bed;with call bell/phone within reach Nurse Communication: Mobility status;Need for lift equipment PT Visit Diagnosis: Other abnormalities of gait and mobility (R26.89);Muscle weakness (generalized) (M62.81)     Time: [id number] PT Time Calculation (min) (ACUTE ONLY): 29 min  Charges:  $Therapeutic Exercise: 8-22 mins $Therapeutic Activity: 8-22 mins                     Hawa Henly P, PT Acute Rehabilitation Services Pager: [phone] Office: [place] Anton[place] Freiman 08/20/2019, 12:48 PM

## 2019-08-21 ENCOUNTER — Telehealth: Payer: Self-pay | Admitting: *Deleted

## 2019-08-21 LAB — GLUCOSE, CAPILLARY
Glucose-Capillary: 128 mg/dL — ABNORMAL HIGH (ref 70–99)
Glucose-Capillary: 107 mg/dL — ABNORMAL HIGH (ref 70–99)
Glucose-Capillary: 123 mg/dL — ABNORMAL HIGH (ref 70–99)

## 2019-08-21 NOTE — Telephone Encounter (Signed)
Yes sorry, I went to lunch.  Yes pt called asking Janett Billow to fill out paperwork for [employer] so this can be ready when he gets out of rehab, pt states he will be in rehab about 2 weeks. Pt also wanted a prescription to get a power wheelchair, I explained to pt he would need to be seen because there is a lot of paperwork that goes along with a power chair. I explained to pt's friend that dropped the papers off that pt would need a bariatric power chair. I also explained to pt that before he is discharged from rehab they should provide everything he needs to come home with. Forms on [employer]

## 2019-08-21 NOTE — Plan of Care (Signed)

## 2019-08-21 NOTE — Progress Notes (Signed)
PROGRESS NOTE    Ronnie Hernandez  [id number] DOB: Jun 12, 1961 DOA: 08/15/2019 PCP: [name], NP    Brief Narrative:  Patient was admitted to the hospital with a working diagnosis of acute goutypolyarthritis/ right knee osteoarthritis. Clinically improving but not yet back to baseline, pending authorization for inpatient rehab.   59 year old male who presented to the right knee pain, bilateral feet pain. He does have significant past medical history for morbid obesity, gout, hypertension, dyslipidemia, asthma, type 2 diabetes mellitus and pulmonary embolism. He reported 4 days of severe pain in both feet and right knee. His pain has been persistent despite outpatient management with colchicine. On his initial physical examination blood pressure 113/72, heart rate 88, respiratory rate 21, oxygen saturation 98%. His lungs are clear to auscultation bilaterally, heart S1-2 present rhythmic, soft abdomen, positive lower extremity edema. 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 Sodium 135, potassium 4.8, chloride 99, bicarb 26, glucose 128, BUN 13, creatinine 1.0, white count 13.3, hemoglobin 14.8, hematocrit 44.9, platelets 188.SARS COVID-19 negative. Right knee x-ray with advanced osteoarthritis of the medial compartment and patellofemoral joint. Small effusion.  Patient has been placed on analgesics, consulted PT and OT.  Patient not yet back to baseline, continue to have significant limitation in movement. Recommendation for inpatient rehab.   Assessment & Plan:   Principal Problem:   Gout attack Active Problems:   Morbid obesity ([place])   Hypertension   Type 2 diabetes mellitus (HCC)   Leukocytosis   Physical deconditioning   Pressure injury of skin   Polyarthritis    1. Acute right knee pain, gout flare/ osteoarthritis flare, in the setting of acute on chronic polyarticular disease. Patient  with polyarticular diease affecting large and small joints, positive tophi on the right hand.  Patient clinically better but not yet back to his baseline, he has a high fall risk considering his very high BMI > than 40 and arthropathy.  Continue treatment with topical diclofenac, plus scheduled ibuprofen,acetaminophen, colchicine, and as neededhydrocodone q 6 H.  Per PT, OT recommendations, patient will benefit from inpatient rehab. Now insurance authorization is pending.   2. HTN. Continue blood pressure control withlisinoprilfor blood pressure control. ( will continue to hold on furosemide to prevent electrolyte disturbances).  3. T2DM(Hgb A1c 6,4)/ dyslipidemia.capillary glucose this am 128, on insulin sliding scalefor glucose cover and monitoring.  On atorvastatin.   4. Obesity class 3, calculated BMI is 80, patient has a high risk for hospital complications due to poor mobility.Pending transfer to CIR, patient will need close follow up as outpatient. Will consult nutrition for recommendations, teaching.   5. Stage 2 decubitus ulcer. Present on admission,Continue with Localwound care.  6. Reactive leukocytosis.clinically resolved.   DVT prophylaxis:enoxaparin Code Status:full Family Communication:no family at the bedside Disposition Plan/ discharge barriers: Patient comes from home, barrier for discharge is severe ambulatory dysfunction due to acute polyarticular disease, high fall risk considering BMI more than 40, pending authorization for inpatient rehab.     Skin Documentation: Pressure Injury 08/16/19 Sacrum Mid Stage 2 -  Partial thickness loss of dermis presenting as a shallow open injury with a red, pink wound bed without slough. Fissure at the apex of the gluteal cleft-MASD (Active)  08/16/19 0130  Location: Sacrum  Location Orientation: Mid  Staging: Stage 2 -  Partial thickness loss of dermis presenting as a shallow open injury with a  red, pink wound bed without slough.  Wound  Description (Comments): Fissure at the apex of the gluteal cleft-MASD  Present on Admission: Yes     Subjective: Patient continue working with physical therapy, his pain has improved but not yet back to baseline, continue to have significant limitation in mobility.   Objective: Vitals:   08/20/19 1929 08/21/19 0149 08/21/19 0427 08/21/19 0902  BP: (!) 140/102 139/61 129/62 135/62  Pulse: 82 75 97 74  Resp: 15  16   Temp: 98 F (36.7 C)  98.4 F (36.9 C) (!) 97.5 F (36.4 C)  TempSrc: Oral   Oral  SpO2: 98% 99% 99% 100%  Weight:      Height:        Intake/Output Summary (Last 24 hours) at 08/21/2019 1127 Last data filed at 08/21/2019 0600 Gross per 24 hour  Intake 480 ml  Output 1950 ml  Net -1470 ml   Filed Weights   08/15/19 1700  Weight: (!) 234 kg    Examination:   General: Not in pain or dyspnea, deconditioned  Neurology: Awake and alert, non focal  E ENT: mild pallor, no icterus, oral mucosa moist Cardiovascular: No JVD. S1-S2 present, rhythmic, no gallops, rubs, or murmurs. No lower extremity edema. Pulmonary: vesicular breath sounds bilaterally, adequate air movement, no wheezing, rhonchi or rales. Gastrointestinal. Abdomen protuberant with no organomegaly, non tender, no rebound or guarding Skin. No rashes Musculoskeletal: bilateral knee hypertrophy with swan neck fingers deformity.      Data Reviewed: I have personally reviewed following labs and imaging studies  CBC: Recent Labs  Lab 08/15/19 1801 08/17/19 0522 08/19/19 0531  WBC 13.3* 14.6* 6.8  NEUTROABS 9.9* 11.7* 3.0  HGB 14.8 14.2 13.9  HCT 44.9 43.0 43.3  MCV 98.7 97.3 98.6  PLT 188 259 [id number]   Basic Metabolic Panel: Recent Labs  Lab 08/15/19 1801 08/17/19 0522 08/19/19 0531  NA 135 134* 134*  K 4.8 4.5 4.7  CL 99 97* 100  CO2 26 25 25   GLUCOSE 128* 215* 119*  BUN 13 31* 28*  CREATININE 1.07 1.08 0.91  CALCIUM 8.7* 8.7* 8.5*    GFR: Estimated Creatinine Clearance: 164.8 mL/min (by C-G formula based on SCr of 0.91 mg/dL). Liver Function Tests: No results for input(s): AST, ALT, ALKPHOS, BILITOT, PROT, ALBUMIN in the last 168 hours. No results for input(s): LIPASE, AMYLASE in the last 168 hours. No results for input(s): AMMONIA in the last 168 hours. Coagulation Profile: No results for input(s): INR, PROTIME in the last 168 hours. Cardiac Enzymes: No results for input(s): CKTOTAL, CKMB, CKMBINDEX, TROPONINI in the last 168 hours. BNP (last 3 results) No results for input(s): PROBNP in the last 8760 hours. HbA1C: No results for input(s): HGBA1C in the last 72 hours. CBG: Recent Labs  Lab 08/20/19 0634 08/20/19 1251 08/20/19 1611 08/20/19 2151 08/21/19 0630  GLUCAP 122* 111* 130* 112* 128*   Lipid Profile: No results for input(s): CHOL, HDL, LDLCALC, TRIG, CHOLHDL, LDLDIRECT in the last 72 hours. Thyroid Function Tests: No results for input(s): TSH, T4TOTAL, FREET4, T3FREE, THYROIDAB in the last 72 hours. Anemia Panel: No results for input(s): VITAMINB12, FOLATE, FERRITIN, TIBC, IRON, RETICCTPCT in the last 72 hours.    Radiology Studies: I have reviewed all of the imaging during this hospital visit personally     Scheduled Meds: . acetaminophen  500 mg Oral Q6H  . allopurinol  300 mg Oral BID  . ascorbic acid  500 mg Oral Daily  . atorvastatin  10 mg Oral q1800  . colchicine  0.6 mg Oral TID  . diclofenac Sodium  2 g Topical QID  . enoxaparin (LOVENOX) injection  115 mg Subcutaneous Q24H  . ibuprofen  400 mg Oral TID  . insulin aspart  0-15 Units Subcutaneous TID WC  . insulin aspart  0-5 Units Subcutaneous QHS  . lisinopril  2.5 mg Oral Daily  . multivitamin with minerals  1 tablet Oral Daily   Continuous Infusions:   LOS: 4 days        Kaisa Wofford Gerome Apley, MD

## 2019-08-21 NOTE — Progress Notes (Addendum)
Physical Therapy Treatment Patient Details Name: Ronnie Hernandez MRN: [id number] DOB: Apr 15, 1961 Today's Date: 08/21/2019    History of Present Illness 59 y.o. male with medical history significant of morbid obesity (BMI 80), gout, hypertension, hyperlipidemia, PE, asthma, non-insulin-dependent type 2 diabetes presenting to the ED for evaluation of pain in his right knee and bilateral feet.  Patient reports 4-day history of severe pain in both of his feet and right knee.     PT Comments    Pt very pleasant and reports he is eager to move and wants to try to get to chair. Drop arm recliner in room weight capacity is 500# but low and pt did not feel comfortable getting to chair after repeated standing trials from bed with fatigue. Pt was able to take some steps with RW and shoes today and educated for continued HEP and progression. Pt remains very motivated to return to walking.    Follow Up Recommendations  CIR;Supervision/Assistance - 24 hour     Equipment Recommendations  None recommended by PT    Recommendations for Other Services       Precautions / Restrictions Precautions Precautions: Fall Precaution Comments: >500# Restrictions Weight Bearing Restrictions: No    Mobility  Bed Mobility Overal bed mobility: Needs Assistance Bed Mobility: Supine to Sit     Supine to sit: Min assist;HOB elevated Sit to supine: Max assist;+2 for safety/equipment   General bed mobility comments: min assist to elevate trunk with HHa. max +3 to return to bed with 1 person to lift each leg and additional support to return trunk to surface. pt able to scoot to [facility] with min assist  Transfers Overall transfer level: Needs assistance Equipment used: Rolling walker (2 wheeled) Transfers: Sit to/from Stand Sit to Stand: Min assist;+2 physical assistance;+2 safety/equipment;From elevated surface;Mod assist         General transfer comment: minA+2 for sit<>stand from EOB from elevated  surface, modA+2 from lower suface x 2 trials with pt able to stand for pericare  Ambulation/Gait Ambulation/Gait assistance: Min assist;+2 physical assistance Gait Distance (Feet): 2 Feet Assistive device: Rolling walker (2 wheeled)     Gait velocity interpretation: <1.31 ft/sec, indicative of household ambulator General Gait Details: pt able to step forward and back at EOB with Rw but did not feel confidant to step further away from surface and recliner too low for pt. Pt fatigued quickly and returned to bed   Stairs             Wheelchair Mobility    Modified Rankin (Stroke Patients Only)       Balance Overall balance assessment: Needs assistance Sitting-balance support: Single extremity supported Sitting balance-Leahy Scale: Poor Sitting balance - Comments: pt requires support while sitting upright on EOB (air bed challenged balance);pt performed crunches x5 while EOB, able to progress trunk to upright with use of bed rail     Standing balance-Leahy Scale: Poor Standing balance comment: reliant on bilateral UE support on RW;                            Cognition Arousal/Alertness: Awake/alert Behavior During Therapy: WFL for tasks assessed/performed Overall Cognitive Status: Within Functional Limits for tasks assessed                                        Exercises General Exercises -  Lower Extremity Heel Slides: AROM;Supine;Both;15 reps Straight Leg Raises: Supine;AROM;Both;15 reps Other Exercises Other Exercises: BUE strengthening with level 2 theraband    General Comments        Pertinent Vitals/Pain Pain Assessment: Faces Pain Score: 3  Faces Pain Scale: Hurts a little bit Pain Location: bil LE and neck Pain Descriptors / Indicators: Aching Pain Intervention(s): Limited activity within patient's tolerance;Monitored during session;Repositioned    Home Living                      Prior Function             PT Goals (current goals can now be found in the care plan section) Acute Rehab PT Goals Patient Stated Goal: to be able to walk and return home Progress towards PT goals: Progressing toward goals    Frequency    Min 3X/week      PT Plan Current plan remains appropriate    Co-evaluation PT/OT/SLP Co-Evaluation/Treatment: Yes Reason for Co-Treatment: Complexity of the patient's impairments (multi-system involvement);For patient/therapist safety PT goals addressed during session: Mobility/safety with mobility;Proper use of DME;Strengthening/ROM OT goals addressed during session: ADL's and self-care      AM-PAC PT "6 Clicks" Mobility   Outcome Measure  Help needed turning from your back to your side while in a flat bed without using bedrails?: A Lot Help needed moving from lying on your back to sitting on the side of a flat bed without using bedrails?: A Little Help needed moving to and from a bed to a chair (including a wheelchair)?: A Lot Help needed standing up from a chair using your arms (e.g., wheelchair or bedside chair)?: A Lot Help needed to walk in hospital room?: Total Help needed climbing 3-5 steps with a railing? : Total 6 Click Score: 11    End of Session   Activity Tolerance: Patient tolerated treatment well Patient left: in bed;with call bell/phone within reach Nurse Communication: Mobility status;Need for lift equipment PT Visit Diagnosis: Other abnormalities of gait and mobility (R26.89);Muscle weakness (generalized) (M62.81)     Time: [id number] PT Time Calculation (min) (ACUTE ONLY): 25 min  Charges:  $Therapeutic Activity: 8-22 mins                     Dorthy Magnussen P, PT Acute Rehabilitation Services Pager: [phone] Office: [place] Dalyla Chui 08/21/2019, 10:33 AM

## 2019-08-21 NOTE — Plan of Care (Signed)
  Problem: Pain Managment: Goal: General experience of comfort will improve Outcome: Progressing   Problem: Safety: Goal: Ability to remain free from injury will improve Outcome: Progressing   Problem: Skin Integrity: Goal: Risk for impaired skin integrity will decrease Outcome: Progressing   

## 2019-08-21 NOTE — Progress Notes (Signed)
Occupational Therapy Treatment Patient Details Name: Ronnie Hernandez MRN: [id number] DOB: March 03, 1961 Today's Date: 08/21/2019    History of present illness 59 y.o. male with medical history significant of morbid obesity (BMI 80), gout, hypertension, hyperlipidemia, PE, asthma, non-insulin-dependent type 2 diabetes presenting to the ED for evaluation of pain in his right knee and bilateral feet.  Patient reports 4-day history of severe pain in both of his feet and right knee.    OT comments  Pt is progressing toward established OT goals. Pt agreeable to OT/PT session and very motivated to participate throughout and asking for feedback at the end of the session. He required moderate assistance to progress toward EOB. While sitting EOB he required support of single UE and intermittent minA from therapists. Pt stood from EOB with modA+2 from lower surface and took 3 steps forward/backward with minA+2. While standing pt required totalA for posterior pericare. Educate pt on use of toilet tongs and additional resources available (e.g. bidet). Pt required maxA+2 to return to supine and setupA for grooming. Pt will continue to benefit from skilled OT services to maximize safety and independence with ADL/IADL and functional mobility. Will continue to follow acutely and progress as tolerated.    Follow Up Recommendations  CIR;Supervision/Assistance - 24 hour    Equipment Recommendations  3 in 1 bedside commode;Other (comment)(bariatric)    Recommendations for Other Services      Precautions / Restrictions Precautions Precautions: Fall Precaution Comments: >500# Restrictions Weight Bearing Restrictions: No       Mobility Bed Mobility Overal bed mobility: Needs Assistance Bed Mobility: Supine to Sit;Sit to Supine     Supine to sit: Mod assist;HOB elevated Sit to supine: Max assist;+2 for safety/equipment   General bed mobility comments: modA to progress trunk upright to sit EOB, maxA+2 for  BLE management with return to bed  Transfers Overall transfer level: Needs assistance Equipment used: Rolling walker (2 wheeled) Transfers: Sit to/from Stand Sit to Stand: Min assist;+2 physical assistance;+2 safety/equipment;From elevated surface;Mod assist         General transfer comment: minA+2 for sit<>stand from EOB from elevated surface, modA+2 from lower suface     Balance Overall balance assessment: Needs assistance Sitting-balance support: Single extremity supported Sitting balance-Leahy Scale: Poor Sitting balance - Comments: pt requires support while sitting upright on EOB (air bed challenged balance);pt performed crunches x5 while EOB, able to progress trunk to upright with use of bed rail     Standing balance-Leahy Scale: Poor Standing balance comment: reliant on bilateral UE support on RW;                           ADL either performed or assessed with clinical judgement   ADL Overall ADL's : Needs assistance/impaired     Grooming: Set up;Sitting Grooming Details (indicate cue type and reason): brushed teeth and washed face after setup while in supported sitting in bed Upper Body Bathing: Minimal assistance               Toilet Transfer: +2 for physical assistance;+2 for safety/equipment;RW;Requires wide/bariatric;Minimal assistance Toilet Transfer Details (indicate cue type and reason): simulated 3 steps forward, 3 steps backward from EOB Toileting- Clothing Manipulation and Hygiene: +2 for physical assistance;+2 for safety/equipment;Moderate assistance Toileting - Clothing Manipulation Details (indicate cue type and reason): modA to powerup, totalA for pericare     Functional mobility during ADLs: Minimal assistance;Rolling walker General ADL Comments: modA for powerup from lower surface  and for RW management as pt requires pulling up from surface, minA+2 for steps forward and backward     Vision       Perception     Praxis       Cognition Arousal/Alertness: Awake/alert Behavior During Therapy: WFL for tasks assessed/performed Overall Cognitive Status: Within Functional Limits for tasks assessed                                          Exercises Exercises: Other exercises Other Exercises Other Exercises: BUE strengthening with level 2 theraband   Shoulder Instructions       General Comments      Pertinent Vitals/ Pain       Pain Assessment: Faces Faces Pain Scale: Hurts a little bit Pain Location: generalized Pain Descriptors / Indicators: Grimacing Pain Intervention(s): Limited activity within patient's tolerance;Monitored during session  Home Living                                          Prior Functioning/Environment              Frequency  Min 2X/week        Progress Toward Goals  OT Goals(current goals can now be found in the care plan section)  Progress towards OT goals: Progressing toward goals  Acute Rehab OT Goals Patient Stated Goal: to be able to walk and return home OT Goal Formulation: With patient Time For Goal Achievement: 08/30/19 Potential to Achieve Goals: Good ADL Goals Pt Will Perform Lower Body Bathing: with min assist;sit to/from stand;with adaptive equipment Pt Will Perform Lower Body Dressing: with min assist;with adaptive equipment;sit to/from stand Pt Will Transfer to Toilet: bedside commode;ambulating;stand pivot transfer;with min guard assist Pt Will Perform Toileting - Clothing Manipulation and hygiene: with min assist;sit to/from stand;sitting/lateral leans;with adaptive equipment Pt/caregiver will Perform Home Exercise Program: Increased strength;With theraband;With written HEP provided;Independently  Plan Discharge plan remains appropriate    Co-evaluation    PT/OT/SLP Co-Evaluation/Treatment: Yes Reason for Co-Treatment: For patient/therapist safety;To address functional/ADL transfers   OT goals addressed  during session: ADL's and self-care      AM-PAC OT "6 Clicks" Daily Activity     Outcome Measure   Help from another person eating meals?: A Little Help from another person taking care of personal grooming?: A Little Help from another person toileting, which includes using toliet, bedpan, or urinal?: A Lot Help from another person bathing (including washing, rinsing, drying)?: A Lot Help from another person to put on and taking off regular upper body clothing?: A Little Help from another person to put on and taking off regular lower body clothing?: Total 6 Click Score: 14    End of Session Equipment Utilized During Treatment: Rolling walker  OT Visit Diagnosis: Other abnormalities of gait and mobility (R26.89);Muscle weakness (generalized) (M62.81);Pain Pain - Right/Left: Right Pain - part of body: Leg;Knee   Activity Tolerance Patient tolerated treatment well   Patient Left in bed;with call bell/phone within reach;with nursing/sitter in room   Nurse Communication Mobility status        Time: [id number] OT Time Calculation (min): 34 min  Charges: OT General Charges $OT Visit: 1 Visit OT Treatments $Self Care/Home Management : 8-22 mins  [place] Office: [phone]  Wyn Forster 08/21/2019, 10:20 AM

## 2019-08-21 NOTE — Telephone Encounter (Signed)
Message left on clinical intake voicemail:   Ronnie Hernandez left message stating he needs Ronnie Hernandez to complete forms that will allow him to have a wheelchair. Ronnie Hernandez needs a triple A motorized wheelchair. Patients current wheelchair is not locking and Ronnie Hernandez is scared that he will fall. Ronnie Hernandez indicated that he is about 500 pounds.    Ronnie Hernandez had a conversation with me prior to 12 pm today telling me that she placed some paperwork on Ronnie Hernandez's desk to complete for Ronnie Hernandez to have assistance with transportation.   I called Ronnie Hernandez to inform him message was received and to find out who he would like order for wheelchair to go to if Ronnie Hernandez's agrees to write. Ronnie Hernandez was unable to provide an answer. Ronnie Hernandez is currently in the hospital and said he will see what they can do in the hospital as far as getting him a wheelchair and call us back if any further assistance is need on our end. Ronnie Hernandez states he thinks he is going to rehab.

## 2019-08-22 ENCOUNTER — Telehealth: Payer: Self-pay

## 2019-08-22 LAB — BASIC METABOLIC PANEL
Anion gap: 9 (ref 5–15)
BUN: 18 mg/dL (ref 6–20)
CO2: 24 mmol/L (ref 22–32)
Calcium: 8.6 mg/dL — ABNORMAL LOW (ref 8.9–10.3)
Chloride: 103 mmol/L (ref 98–111)
Creatinine, Ser: 0.93 mg/dL (ref 0.61–1.24)
GFR calc Af Amer: >60 mL/min (ref >60)
GFR calc non Af Amer: >60 mL/min (ref >60)
Glucose, Bld: 133 mg/dL — ABNORMAL HIGH (ref 70–99)
Potassium: 4.2 mmol/L (ref 3.5–5.1)
Sodium: 136 mmol/L (ref 135–145)

## 2019-08-22 LAB — GLUCOSE, CAPILLARY
Glucose-Capillary: 120 mg/dL — ABNORMAL HIGH (ref 70–99)
Glucose-Capillary: 113 mg/dL — ABNORMAL HIGH (ref 70–99)
Glucose-Capillary: 117 mg/dL — ABNORMAL HIGH (ref 70–99)

## 2019-08-22 MED ORDER — IBUPROFEN 400 MG PO TABS: 400.0000 mg | ORAL_TABLET | Freq: Three times a day (TID) | ORAL | 0 refills | Status: DC | PRN

## 2019-08-22 NOTE — Progress Notes (Signed)
    Durable Medical Equipment  (From admission, onward)         Start     Ordered   08/22/19 1555  For home use only DME standard manual wheelchair with seat cushion  Once    Comments: Patient suffers from weakness which impairs their ability to perform daily activities like walking in the home.  A rolling walker  will not resolve issue with performing activities of daily living. A wheelchair will allow patient to safely perform daily activities. Patient can safely propel the wheelchair in the home or has a caregiver who can provide assistance. Length of need 12 months. Accessories: elevating leg rests (ELRs), wheel locks, extensions and anti-tippers. Pt needs bariatric wheelchair. Weighs 515 lbs   08/22/19 1556

## 2019-08-22 NOTE — Discharge Instructions (Signed)

## 2019-08-22 NOTE — Telephone Encounter (Signed)
Form completed and given to anita, and in agreement with the powerwheel chair, should be done at facility

## 2019-08-22 NOTE — TOC Transition Note (Addendum)
Transition of Care [facility]) - CM/SW Discharge Note   Patient Details  Name: Ronnie Hernandez MRN: [id number] Date of Birth: [birth year]  [facility] CM/SW Contact:  Atilano Median, LCSW Phone Number: 08/22/2019, 3:28 PM   Clinical Narrative:     CSW informed that patient will need a bariatric wheelchair. CSW spoke with [facility] and they have them available. However, they will not be able to deliver until tomorrow. Necessary paperwork has been sent to MD for signature and will be sent back to [facility].   Wheelchair should be delivered tomorrow to patient's home. Patient aware and agreeable to this plan. Patient states that no one will be home to assist him until around 7:30pm tonight. CSW will leave handoff for unit RN to call [facility] around this time.   PLEASE CALL [facility] AROUND 7:30PM ONCE PATIENT'S FAMILY IS HOME.   [facility] # [id number] OPTION 3   Med necessity has already been completed.   Discharged home. Patient declined CIR due to copay. Unfortunately patient's weight will be a barrier to SNF placement. Patient prefers to dc home with home health services. Patient and caregiver working with PCP to get needed DME prior to this admission.   No other needs at this time. Case closed to this CSW.   Final next level of care: Home w Home Health Services Barriers to Discharge: Barriers Resolved   Patient Goals and CMS Choice   CMS Medicare.gov Compare Post Acute Care list provided to:: Patient Choice offered to / list presented to : Patient  Discharge Placement                Patient to be transferred to facility by: [facility]      Discharge Plan and Services                          HH Arranged: PT, OT, RN [facility] Agency: [facility] Date [place]: 08/22/19 Time [place]: 1528 Representative spoke with at [place]: Cassie  Social Determinants of Health ([place]) Interventions     Readmission Risk  Interventions No flowsheet data found.

## 2019-08-22 NOTE — Progress Notes (Signed)
Inpatient Rehabilitation Admissions Coordinator  Met with patient at bedside to discuss cost of care if CIR approved by payor. Due to cost, patient would like to pursue SNF. I have contacted SW, [place] and MD. We will sign off at this time.  Danne Baxter, RN, MSN Rehab Admissions Coordinator [phone] 08/22/2019 11:49 AM

## 2019-08-22 NOTE — Progress Notes (Signed)
    Durable Medical Equipment  (From admission, onward)         Start     Ordered   08/22/19 1548  For home use only DME lightweight manual wheelchair with seat cushion  Once    Comments: Patient suffers from weakness which impairs their ability to perform daily activities like walking in the home.  A rolling walker will not resolve  issue with performing activities of daily living. A wheelchair will allow patient to safely perform daily activities. Patient is not able to propel themselves in the home using a standard weight wheelchair due to weakness. Patient can self propel in the lightweight wheelchair. Length of need 12 months. Accessories: elevating leg rests (ELRs), wheel locks, extensions and anti-tippers.   08/22/19 1549

## 2019-08-22 NOTE — Progress Notes (Signed)
Nutrition Brief Note  RD consulted for assessment of nutritional requirements/ status.  Wt Readings from Last 15 Encounters:  08/15/19 (!) 234 kg  03/12/19 (!) 232.8 kg  01/29/19 (!) 233.2 kg  08/29/18 (!) 195.9 kg  04/17/18 (!) 209.9 kg  04/03/18 (!) 245.8 kg  11/24/17 (!) 206.1 kg  11/15/17 (!) 207.7 kg  09/15/17 (!) 215.5 kg  09/15/17 (!) 215.5 kg  04/26/17 (!) 198.2 kg  03/25/17 (!) 214.6 kg  11/29/16 (!) 208.1 kg  11/20/16 (!) 215 kg  08/19/16 (!) 85.69 kg   59 year old male who presented to the right knee pain, bilateral feet pain. He does have significant past medical history for morbid obesity, gout, hypertension, dyslipidemia, asthma, type 2 diabetes mellitus and pulmonary embolism. He reported 4 days of severe pain in both feet and right knee. His pain has been persistent despite outpatient management with colchicine. On his initial physical examination blood pressure 113/72, heart rate 88, respiratory rate 21, oxygen saturation 98%. His lungs are clear to auscultation bilaterally, heart S1-2 present rhythmic, soft abdomen, positive lower extremity edema. 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  Spoke with pt at bedside, who was pleasant and in good spirits today. He reports feeling well and shares that he is hopeful he will be discharged to SNF for short term rehab soon.   Pt reports good appetite, is waiting for lunch to arrive. Pt enjoys the meals here in the hospital (Breakfast: omelette; Lunch: chicken tenders and fries; Dinner: sandwich and salad). Pt shares PTA his niece had been cooking a lot of baked chicken a lot of baked chicken and vegetables, but also shares he was consuming a lot of [place] bacon and sausage, which he believes exacerbated his edema ("I didn't know the nitrates were bad for me").   Pt has been educated by this RD regarding heart failure during previous admission. Reviewed importance  of following a low sodium diet and self-management to prevent further complications. Pt had not further questions, but expressed appreciation for RD visit. Attached "Heart Healthy, Consistent Carbohydrate Nutrition Therapy" handout to AVS/ discharge instructions.  Nutrition-Focused physical exam completed. Findings are no fat depletion, no muscle depletion, and moderate edema.   Obesity is a complex, chronic medical condition that is optimally managed by a multidisciplinary care team. Weight loss is not an ideal goal for an acute inpatient hospitalization. However, if further work-up for obesity is warranted, consider outpatient referral to outpatient bariatric service and/or [place].   Current diet order is carb modified/ heart healthy, patient is consuming approximately 100% of meals at this time. Labs and medications reviewed.   No nutrition interventions warranted at this time. If nutrition issues arise, please consult RD.   Loistine Chance, RD, LDN, [place] Registered Dietitian II Certified Diabetes Care and Education Specialist Please refer to [facility] for RD and/or RD on-call/weekend/after hours pager

## 2019-08-22 NOTE — Discharge Summary (Signed)
Discharge Summary  Ronnie Hernandez [id number] DOB: 07-03-1960  PCP: [name], NP  Admit date: 08/15/2019 Discharge date: 08/22/2019  Time spent: 40 mins  Recommendations for Outpatient Follow-up:  1.  PCP in 1 week with follow up labs  Discharge Diagnoses:  Active Hospital Problems   Diagnosis Date Noted  . Gout attack 08/15/2019  . Polyarthritis 08/17/2019  . Physical deconditioning 08/16/2019  . Pressure injury of skin 08/16/2019  . Leukocytosis 07/06/2016  . Type 2 diabetes mellitus ([place]) 12/25/2013  . Hypertension   . Morbid obesity [facility])     Resolved Hospital Problems  No resolved problems to display.    Discharge Condition: Stable  Diet recommendation: Heart healthy/mod carb  Vitals:   08/22/19 0750 08/22/19 1319  BP: (!) 121/58 (!) 128/56  Pulse: 79 78  Resp: 18 17  Temp: 97.7 F (36.5 C) 97.7 F (36.5 C)  SpO2: 96% 98%    History of present illness:  59 year old male who presented to the right knee pain, bilateral feet pain. He does have significant past medical history for morbid obesity, gout, hypertension, dyslipidemia, asthma, type 2 diabetes mellitus and pulmonary embolism. He reported 4 days of severe pain in both feet and right knee. His pain has been persistent despite outpatient management with colchicine. On his initial physical examination blood pressure 113/72, heart rate 88, respiratory rate 21, oxygen saturation 98%. His lungs are clear to auscultation bilaterally, heart S1-2 present rhythmic, soft abdomen, positive lower extremity edema. 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 Sodium 135, potassium 4.8, chloride 99, bicarb 26, glucose 128, BUN 13, creatinine 1.0, white count 13.3, hemoglobin 14.8, hematocrit 44.9, platelets 188.SARS COVID-19 negative. Right knee x-ray with advanced osteoarthritis of the medial compartment and patellofemoral joint. Small  effusion. Patient was given analgesics, consulted PT and OT.    Today, patient reports feeling better, was able to move around comfortably in bed, reports currently back to baseline. Due to inability to afford inpatient rehab and unable to get a bed at SNF due to morbid obesity, patient discharged home with home health PT/OT/RN.  Patient advised to follow-up with PCP in 1 week.  Patient will be started home with an ambulance.  Hospital Course:  Principal Problem:   Gout attack Active Problems:   Morbid obesity ([place])   Hypertension   Type 2 diabetes mellitus ([facility])   Leukocytosis   Physical deconditioning   Pressure injury of skin   Polyarthritis  Acute right knee pain, gout flare/ osteoarthritis flare, in the setting of acute on chronic polyarticular disease Patient with polyarticular diease affecting large and small joints, positive tophi on the right hand Currently improved, back to baseline Continue treatment with topical diclofenac,prn ibuprofen,acetaminophen,colchicine Home health PT/OT/RN  HTN Continue lisinopril  T2DM(Hgb A1c 6,4)/ dyslipidemia Continue home regimen Continue Lipitor   Stage 2 decubitus ulcer Present on admission,Continue with Localwound care Home health RN  Very morbid obesity BMI of 80 Lifestyle modification advised       Nutrition Interventions:      Estimated body mass index is 80.8 kg/m as calculated from the following:   Height as of this encounter: 5\' 7"  (1.702 m).   Weight as of this encounter: 234 kg.     Procedures:  None  Consultations:  None  Discharge Exam: BP (!) 128/56 (BP Location: Right Wrist)   Pulse 78   Temp 97.7 F (36.5 C) (Oral)   Resp 17  Ht 5\' 7"  (1.702 m)   Wt (!) 234 kg   SpO2 98%   BMI 80.80 kg/m    General: NAD, very morbidly obese, pleasant Cardiovascular: S1, S2 present Respiratory: CTAB  Discharge Instructions You were cared for by a hospitalist during your hospital stay.  If you have any questions about your discharge medications or the care you received while you were in the hospital after you are discharged, you can call the unit and asked to speak with the hospitalist on call if the hospitalist that took care of you is not available. Once you are discharged, your primary care physician will handle any further medical issues. Please note that NO REFILLS for any discharge medications will be authorized once you are discharged, as it is imperative that you return to your primary care physician (or establish a relationship with a primary care physician if you do not have one) for your aftercare needs so that they can reassess your need for medications and monitor your lab values.  Discharge Instructions    Diet - low sodium heart healthy   Complete by: As directed    Increase activity slowly   Complete by: As directed      Allergies as of 08/22/2019      Reactions   Uloric [febuxostat] Other (See Comments)   Made the skin on feet and legs "hurt"      Medication List    TAKE these medications   acetaminophen 500 MG tablet Commonly known as: TYLENOL Take 1,000 mg by mouth every 4 (four) hours as needed (pain).   albuterol 108 (90 Base) MCG/ACT inhaler Commonly known as: VENTOLIN HFA Inhale 1-2 puffs into the lungs every 6 (six) hours as needed for wheezing or shortness of breath.   allopurinol 300 MG tablet Commonly known as: ZYLOPRIM Take 1 tablet (300 mg total) by mouth 2 (two) times daily.   ascorbic acid 500 MG tablet Commonly known as: VITAMIN C Take 500 mg by mouth daily.   atorvastatin 10 MG tablet Commonly known as: LIPITOR Take 1 tablet (10 mg total) by mouth daily at 6 PM.   BENGAY ARTHRITIS FORMULA EX Apply 1 application topically 2 (two) times daily as needed (pain).   colchicine 0.6 MG tablet Take 1 tablet (0.6 mg total) by mouth daily.   diclofenac Sodium 1 % Gel Commonly known as: Voltaren Apply 2 g topically 4 (four) times  daily. What changed:   when to take this  reasons to take this   furosemide 40 MG tablet Commonly known as: LASIX Take one tablet by mouth once daily What changed:   how much to take  how to take this  when to take this   ibuprofen 400 MG tablet Commonly known as: ADVIL Take 1 tablet (400 mg total) by mouth every 8 (eight) hours as needed for moderate pain.   lisinopril 2.5 MG tablet Commonly known as: ZESTRIL TAKE 1 TABLET BY MOUTH EVERY DAY   multivitamin with minerals Tabs tablet Take 1 tablet by mouth daily. One a Day Men's   UNABLE TO FIND bariatric drop arm commode Dx) R54, E66.01, I50.9, M19.9   UNABLE TO FIND Bariatric tub transfer bench Dx) R54, E66.01, I50.9, M19.9   UNABLE TO FIND Bariatric lift chair. Dx) R54, E66.01, I50.9, M19.9   UNABLE TO FIND Heavy duty Cane Dx) R54, E66.01, I50.9, M19.9      Allergies  Allergen Reactions  . Uloric [Febuxostat] Other (See Comments)    Made the  skin on feet and legs "hurt"   Follow-up Information    [name], NP. Schedule an appointment as soon as possible for a visit in 1 week(s).   Specialty: Geriatric Medicine Contact information: [address]. [place] [address] [phone]            The results of significant diagnostics from this hospitalization (including imaging, microbiology, ancillary and laboratory) are listed below for reference.    Significant Diagnostic Studies: DG Knee Complete 4 Views Right  Result Date: 08/15/2019 CLINICAL DATA:  History of gout.  Pain. EXAM: RIGHT KNEE - COMPLETE 4+ VIEW COMPARISON:  04/19/2017 FINDINGS: Atypical positioning because of patient's size. Advanced osteoarthritis of the medial compartment and patellofemoral joint with joint space narrowing and marginal osteophytes. Small joint effusion. No evidence of fracture, erosions or visible loose bodies. IMPRESSION: Advanced osteoarthritis of the medial compartment and patellofemoral joint. Small  effusion. Electronically Signed   By: Nelson Chimes M.D.   On: 08/15/2019 16:55   DG Foot Complete Left  Result Date: 08/15/2019 CLINICAL DATA:  Foot pain, history of gout EXAM: RIGHT FOOT COMPLETE - 3+ VIEW; LEFT FOOT - COMPLETE 3+ VIEW COMPARISON:  04/21/2017 FINDINGS: No fracture or dislocation of the bilateral feet. There is severe arthrosis throughout the bilateral metatarsophalangeal joints, with numerous periarticular bony erosions, not significantly changed in appearance compared to prior examination dated 04/21/2017. There is complete ankylosis of the bilateral mid feet and subtalar joints, and moderate arthrosis of the bilateral ankle mortises, again likely periarticular erosions. Diffuse soft tissue edema of the bilateral feet and ankles. IMPRESSION: 1.  No fracture or dislocation of the bilateral feet. 2. Severe arthrosis throughout the bilateral metatarsophalangeal joints, with numerous periarticular bony erosions, not significant changed in appearance compared to prior examination dated 04/21/2017. Findings are generally in keeping with reported history of gouty arthropathy, although unusually severe and other erosive arthropathies are general differential considerations. 3. Complete bony ankylosis of the bilateral mid feet and subtalar joints. 4. Moderate arthrosis of the bilateral ankle mortise ease, likely with periarticular erosions as well. 5. MRI is the test of choice for the assessment of inflammatory periarthritis or synovitis, as well as osteomyelitis if clinically suspected. Electronically Signed   By: Eddie Candle M.D.   On: 08/15/2019 16:59   DG Foot Complete Right  Result Date: 08/15/2019 CLINICAL DATA:  Foot pain, history of gout EXAM: RIGHT FOOT COMPLETE - 3+ VIEW; LEFT FOOT - COMPLETE 3+ VIEW COMPARISON:  04/21/2017 FINDINGS: No fracture or dislocation of the bilateral feet. There is severe arthrosis throughout the bilateral metatarsophalangeal joints, with numerous periarticular  bony erosions, not significantly changed in appearance compared to prior examination dated 04/21/2017. There is complete ankylosis of the bilateral mid feet and subtalar joints, and moderate arthrosis of the bilateral ankle mortises, again likely periarticular erosions. Diffuse soft tissue edema of the bilateral feet and ankles. IMPRESSION: 1.  No fracture or dislocation of the bilateral feet. 2. Severe arthrosis throughout the bilateral metatarsophalangeal joints, with numerous periarticular bony erosions, not significant changed in appearance compared to prior examination dated 04/21/2017. Findings are generally in keeping with reported history of gouty arthropathy, although unusually severe and other erosive arthropathies are general differential considerations. 3. Complete bony ankylosis of the bilateral mid feet and subtalar joints. 4. Moderate arthrosis of the bilateral ankle mortise ease, likely with periarticular erosions as well. 5. MRI is the test of choice for the assessment of inflammatory periarthritis or synovitis, as well as osteomyelitis if  clinically suspected. Electronically Signed   By: Eddie Candle M.D.   On: 08/15/2019 16:59   VAS [place] LOWER EXTREMITY VENOUS (DVT) (ONLY [facility] & [facility] 7a-7p)  Result Date: 08/15/2019  Lower Venous DVTStudy Indications: Pain, and Edema.  Limitations: Body habitus and poor ultrasound/tissue interface. Comparison Study: 08/26/18 previous Performing Technologist: Abram Sander RVS  Examination Guidelines: A complete evaluation includes B-mode imaging, spectral Doppler, color Doppler, and power Doppler as needed of all accessible portions of each vessel. Bilateral testing is considered an integral part of a complete examination. Limited examinations for reoccurring indications may be performed as noted. The reflux portion of the exam is performed with the patient in reverse Trendelenburg.  +---------+---------------+---------+-----------+----------+--------------+ RIGHT     CompressibilityPhasicitySpontaneityPropertiesThrombus Aging +---------+---------------+---------+-----------+----------+--------------+ CFV      Full           Yes      Yes                                 +---------+---------------+---------+-----------+----------+--------------+ SFJ      Full                                                        +---------+---------------+---------+-----------+----------+--------------+ FV Prox  Full                                                        +---------+---------------+---------+-----------+----------+--------------+ FV Mid   Full                                                        +---------+---------------+---------+-----------+----------+--------------+ FV DistalFull                                                        +---------+---------------+---------+-----------+----------+--------------+ PFV      Full                                                        +---------+---------------+---------+-----------+----------+--------------+ POP      Full           Yes      Yes                                 +---------+---------------+---------+-----------+----------+--------------+ PTV      Full                                                        +---------+---------------+---------+-----------+----------+--------------+  PERO                                                  Not visualized +---------+---------------+---------+-----------+----------+--------------+   +---------+---------------+---------+-----------+----------+--------------+ LEFT     CompressibilityPhasicitySpontaneityPropertiesThrombus Aging +---------+---------------+---------+-----------+----------+--------------+ CFV      Full           Yes      Yes                                 +---------+---------------+---------+-----------+----------+--------------+ SFJ      Full                                                         +---------+---------------+---------+-----------+----------+--------------+ FV Prox  Full                                                        +---------+---------------+---------+-----------+----------+--------------+ FV Mid                  Yes      Yes                                 +---------+---------------+---------+-----------+----------+--------------+ FV Distal               Yes      Yes                                 +---------+---------------+---------+-----------+----------+--------------+ PFV      Full                                                        +---------+---------------+---------+-----------+----------+--------------+ POP      Full           Yes      Yes                                 +---------+---------------+---------+-----------+----------+--------------+ PTV      Full                                                        +---------+---------------+---------+-----------+----------+--------------+ PERO                                                  Not visualized +---------+---------------+---------+-----------+----------+--------------+  Summary: BILATERAL: - No evidence of deep vein thrombosis seen in the lower extremities, bilaterally.   *See table(s) above for measurements and observations. Electronically signed by Harold Barban MD on 08/15/2019 at 10:30:27 PM.    Final     Microbiology: Recent Results (from the past 240 hour(s))  SARS CORONAVIRUS 2 (TAT 6-24 HRS) Nasopharyngeal Nasopharyngeal Swab     Status: None   Collection Time: 08/15/19 11:04 PM   Specimen: Nasopharyngeal Swab  Result Value Ref Range Status   SARS Coronavirus 2 NEGATIVE NEGATIVE Final    Comment: (NOTE) SARS-CoV-2 target nucleic acids are NOT DETECTED. The SARS-CoV-2 RNA is generally detectable in upper and lower respiratory specimens during the acute phase of infection. Negative results do not preclude SARS-CoV-2  infection, do not rule out co-infections with other pathogens, and should not be used as the sole basis for treatment or other patient management decisions. Negative results must be combined with clinical observations, patient history, and epidemiological information. The expected result is Negative. Fact Sheet for Patients: SugarRoll.be Fact Sheet for Healthcare Providers: https://www.woods-mathews.com/ This test is not yet approved or cleared by the [place] FDA and  has been authorized for detection and/or diagnosis of SARS-CoV-2 by FDA under an Emergency Use Authorization (EUA). This EUA will remain  in effect (meaning this test can be used) for the duration of the COVID-19 declaration under Section 56 4(b)(1) of the Act, 21 U.S.C. section 360bbb-3(b)(1), unless the authorization is terminated or revoked sooner. Performed at [facility], [place] [address] [place], [place] [address]      Labs: Basic Metabolic Panel: Recent Labs  Lab 08/15/19 1801 08/17/19 0522 08/19/19 0531 08/22/19 0323  NA 135 134* 134* 136  K 4.8 4.5 4.7 4.2  CL 99 97* 100 103  CO2 26 25 25 24   GLUCOSE 128* 215* 119* 133*  BUN 13 31* 28* 18  CREATININE 1.07 1.08 0.91 0.93  CALCIUM 8.7* 8.7* 8.5* 8.6*   Liver Function Tests: No results for input(s): AST, ALT, ALKPHOS, BILITOT, PROT, ALBUMIN in the last 168 hours. No results for input(s): LIPASE, AMYLASE in the last 168 hours. No results for input(s): AMMONIA in the last 168 hours. CBC: Recent Labs  Lab 08/15/19 1801 08/17/19 0522 08/19/19 0531  WBC 13.3* 14.6* 6.8  NEUTROABS 9.9* 11.7* 3.0  HGB 14.8 14.2 13.9  HCT 44.9 43.0 43.3  MCV 98.7 97.3 98.6  PLT 188 259 288   Cardiac Enzymes: No results for input(s): CKTOTAL, CKMB, CKMBINDEX, TROPONINI in the last 168 hours. BNP: BNP (last 3 results) Recent Labs    08/26/18 0110 01/29/19 1422  BNP 21.9 40    ProBNP (last 3 results) No  results for input(s): PROBNP in the last 8760 hours.  CBG: Recent Labs  Lab 08/21/19 0630 08/21/19 1113 08/21/19 1607 08/22/19 0632 08/22/19 1124  GLUCAP 128* 107* 123* 120* 113*       Signed:  Alma Friendly, MD [facility] 08/22/2019, 2:14 PM

## 2019-08-22 NOTE — Telephone Encounter (Signed)
Patient had a family member pick up forms completed by Janett Billow today.  Patient would like forms corrected. Patient states it was marked that he does not need assistance and that is inaccurate.   Patient states he always has someone with him to assist with pushing his wheelchair, helping him get in and out of transportation, on and off scales, ect.  Patient uses a wheelchair, walker and a cain and needs this noted on the forms as well. Patients family member plans to drop forms back off tomorrow.  Call when corrections made and ready for pick-up

## 2019-08-22 NOTE — Telephone Encounter (Signed)
Ronnie Hernandez, Caregiver [phone] Notified and agreed.  Made copy of forms and sent for scanning.  Paperwork left up front for pick up.

## 2019-08-22 NOTE — Progress Notes (Addendum)
Patient discharging home. Discharge instructions explained to patient and he verbalized understanding. Packed all personal belongings. No further questions or concerns voiced. Awaiting transportation.

## 2019-08-22 NOTE — Progress Notes (Signed)
PTAR set up for transport home.

## 2019-08-23 DIAGNOSIS — E119 Type 2 diabetes mellitus without complications: Secondary | ICD-10-CM | POA: Diagnosis not present

## 2019-08-23 DIAGNOSIS — Z86711 Personal history of pulmonary embolism: Secondary | ICD-10-CM | POA: Diagnosis not present

## 2019-08-23 DIAGNOSIS — R278 Other lack of coordination: Secondary | ICD-10-CM | POA: Diagnosis not present

## 2019-08-23 DIAGNOSIS — M109 Gout, unspecified: Secondary | ICD-10-CM | POA: Diagnosis not present

## 2019-08-23 DIAGNOSIS — M6281 Muscle weakness (generalized): Secondary | ICD-10-CM | POA: Diagnosis not present

## 2019-08-23 DIAGNOSIS — Z6841 Body Mass Index (BMI) 40.0 and over, adult: Secondary | ICD-10-CM

## 2019-08-23 DIAGNOSIS — M130 Polyarthritis, unspecified: Secondary | ICD-10-CM | POA: Diagnosis not present

## 2019-08-24 ENCOUNTER — Telehealth: Payer: Self-pay | Admitting: *Deleted

## 2019-08-24 DIAGNOSIS — Z86711 Personal history of pulmonary embolism: Secondary | ICD-10-CM | POA: Diagnosis not present

## 2019-08-24 DIAGNOSIS — M109 Gout, unspecified: Secondary | ICD-10-CM | POA: Diagnosis not present

## 2019-08-24 DIAGNOSIS — E119 Type 2 diabetes mellitus without complications: Secondary | ICD-10-CM | POA: Diagnosis not present

## 2019-08-24 DIAGNOSIS — M130 Polyarthritis, unspecified: Secondary | ICD-10-CM | POA: Diagnosis not present

## 2019-08-24 DIAGNOSIS — R278 Other lack of coordination: Secondary | ICD-10-CM | POA: Diagnosis not present

## 2019-08-24 DIAGNOSIS — M6281 Muscle weakness (generalized): Secondary | ICD-10-CM | POA: Diagnosis not present

## 2019-08-24 MED ORDER — NYSTATIN 100000 UNIT/GM EX POWD: 1.0000 "application " | Freq: Three times a day (TID) | CUTANEOUS | 0 refills | Status: DC

## 2019-08-24 NOTE — Telephone Encounter (Signed)
Form completed and given to [facility]

## 2019-08-24 NOTE — Telephone Encounter (Signed)
Rx sent to pharmacy on file.

## 2019-08-24 NOTE — Telephone Encounter (Signed)
Transition Care Management Follow-up Telephone Call  Date of discharge and from where: 08/22/2019 [facility]  How have you been since you were released from the hospital? better  Any questions or concerns? Yes  cannot come into office wants mychart visit  Items Reviewed:  Did the pt receive and understand the discharge instructions provided? Yes   Medications obtained and verified? Yes   Any new allergies since your discharge? No   Dietary orders reviewed? Yes  Do you have support at home? Yes   Other (ie: DME, Home Health, etc) Home Health  Functional Questionnaire: (I = Independent and D = Dependent) ADL's: I with Assistance  Bathing/Dressing- I with assistance   Meal Prep- D  Eating- I  Maintaining continence- I  Transferring/Ambulation- I with assistance  Managing Meds- I with assistance   Follow up appointments reviewed:    PCP Hospital f/u appt confirmed? Yes  Scheduled to see Janett Billow on 08/27/19 .  [facility] f/u appt confirmed? No    Are transportation arrangements needed? No   If their condition worsens, is the pt aware to call  their PCP or go to the ED? Yes  Was the patient provided with contact information for the PCP's office or ED? Yes  Was the pt encouraged to call back with questions or concerns? Yes

## 2019-08-24 NOTE — Telephone Encounter (Signed)
Nurse with [facility] notified and agreed and will let patient know.

## 2019-08-24 NOTE — Telephone Encounter (Signed)
Patient notified and will have caregiver pick up.  Copies made and sent for scanning.

## 2019-08-24 NOTE — Telephone Encounter (Signed)
Shaqura with [facility] called and stated that she saw Patient today and added skilled nursing.  Stated that patient has redness and moisture build up in his folds and wants to know if you will send in a Rx for Nystatin Powder.  Please Advise.

## 2019-08-25 DIAGNOSIS — E1165 Type 2 diabetes mellitus with hyperglycemia: Secondary | ICD-10-CM | POA: Diagnosis not present

## 2019-08-27 ENCOUNTER — Other Ambulatory Visit: Payer: Self-pay | Admitting: *Deleted

## 2019-08-27 ENCOUNTER — Encounter: Payer: Self-pay | Admitting: Nurse Practitioner

## 2019-08-27 ENCOUNTER — Ambulatory Visit (INDEPENDENT_AMBULATORY_CARE_PROVIDER_SITE_OTHER): Payer: Medicare Other | Admitting: Nurse Practitioner

## 2019-08-27 ENCOUNTER — Other Ambulatory Visit: Payer: Self-pay

## 2019-08-27 DIAGNOSIS — Z86711 Personal history of pulmonary embolism: Secondary | ICD-10-CM | POA: Diagnosis not present

## 2019-08-27 DIAGNOSIS — E785 Hyperlipidemia, unspecified: Secondary | ICD-10-CM

## 2019-08-27 DIAGNOSIS — R278 Other lack of coordination: Secondary | ICD-10-CM | POA: Diagnosis not present

## 2019-08-27 DIAGNOSIS — R5381 Other malaise: Secondary | ICD-10-CM

## 2019-08-27 DIAGNOSIS — M109 Gout, unspecified: Secondary | ICD-10-CM | POA: Diagnosis not present

## 2019-08-27 DIAGNOSIS — E119 Type 2 diabetes mellitus without complications: Secondary | ICD-10-CM

## 2019-08-27 DIAGNOSIS — E66813 Obesity, class 3: Secondary | ICD-10-CM

## 2019-08-27 DIAGNOSIS — E669 Obesity, unspecified: Secondary | ICD-10-CM

## 2019-08-27 DIAGNOSIS — E1169 Type 2 diabetes mellitus with other specified complication: Secondary | ICD-10-CM

## 2019-08-27 DIAGNOSIS — M130 Polyarthritis, unspecified: Secondary | ICD-10-CM | POA: Diagnosis not present

## 2019-08-27 DIAGNOSIS — Z6841 Body Mass Index (BMI) 40.0 and over, adult: Secondary | ICD-10-CM

## 2019-08-27 DIAGNOSIS — M6281 Muscle weakness (generalized): Secondary | ICD-10-CM | POA: Diagnosis not present

## 2019-08-27 MED ORDER — ALLOPURINOL 300 MG PO TABS: 300.0000 mg | ORAL_TABLET | Freq: Two times a day (BID) | ORAL | 1 refills | Status: AC

## 2019-08-27 NOTE — Progress Notes (Signed)
This service is provided via telemedicine  No vital signs collected/recorded due to the encounter was a telemedicine visit.   Location of patient (ex: home, work):  Home   Patient consents to a telephone visit:  Yes  Location of the provider (ex: office, home):  [facility], Office   Name of any referring provider:  N/A  Names of all persons participating in the telemedicine service and their role in the encounter:  S.Chrae B/CMA, Sherrie Mustache, NP, Niece Phineas Real), and Patient   Time spent on call:  13 min with medical assistant  \    Careteam: [name], NP as PCP - General (Nurse Practitioner)  PLACE OF SERVICE:  [place] Directive information Does Patient Have a Medical Advance Directive?: No, Would patient like information on creating a medical advance directive?: No - Patient declined  Allergies  Allergen Reactions  . Uloric [Febuxostat] Other (See Comments)    Made the skin on feet and legs "hurt"    Chief Complaint  Patient presents with  . [facility] follow-up 2/24-3/3 for gout attack. Symptoms improved since discharge. Patient had OT this morning with [facility].      HPI: Patient is a 59 y.o. male for hospital follow up via virtual visit.  He went to the ED due to severe right knee pain and bilateral feet pain.  Pt with hx of medical history for morbid obesity, gout, hypertension, dyslipidemia, asthma, type 2 diabetes mellitus and pulmonary embolism. Xray revealed advance OA of the medial compartment and patellofemoral joint. Small effusion noted. PT and OT was consulted. Dx with acute gout attack. He was back to baseline on discharge. Continues with treatment of PRN topical diclofenac, colchicine and ibuprofen.  He could not afford to go to rehab therefore outpt PT/POT was consulted.  equipment is there that he needs except they could not find a cane, he is going to have the therapist  help me. PT coming later today.  Reports he is taking allopurinol 300 mg BID, states he had a large supply. (last refill from our office was 2019)     Review of Systems:  Review of Systems  Constitutional: Negative for chills, fever and weight loss.  Respiratory: Negative for cough, sputum production and shortness of breath.   Cardiovascular: Positive for leg swelling (stable). Negative for chest pain and palpitations.  Gastrointestinal: Negative for abdominal pain, constipation, diarrhea and heartburn.  Genitourinary: Negative for dysuria, frequency and urgency.  Musculoskeletal: Positive for joint pain (stable). Negative for back pain and myalgias.  Skin: Negative.   Neurological: Positive for weakness. Negative for dizziness and headaches.  Psychiatric/Behavioral: Negative for depression and memory loss. The patient does not have insomnia.     Past Medical History:  Diagnosis Date  . Arthritis    "knees, hands" (04/19/2017)  . Childhood asthma   . Diabetes type 2, uncontrolled ([place])   . Gout    "have had it in both feet, both hands, right shoulder, back" (04/19/2017)  . Hyperlipidemia   . Hypertension   . Morbidly obese ([place])   . Pulmonary embolism ([place]) 2017?   Past Surgical History:  Procedure Laterality Date  . COLONOSCOPY W/ BIOPSIES AND POLYPECTOMY  07/2011   Archie Endo 07/27/2011   Social History:   reports that he quit smoking about 37 years ago. His smoking use included cigarettes. He has never used smokeless tobacco. He reports previous drug use. He reports  that he does not drink alcohol.  Family History  Problem Relation Age of Onset  . Colon cancer Maternal Grandfather 14  . Heart attack Father   . Stomach cancer Maternal Aunt 80    Medications: Patient's Medications  New Prescriptions   No medications on file  Previous Medications   ALBUTEROL (VENTOLIN HFA) 108 (90 BASE) MCG/ACT INHALER    Inhale 1-2 puffs into the lungs every 6 (six) hours as needed for  wheezing or shortness of breath.   ALLOPURINOL (ZYLOPRIM) 300 MG TABLET    Take 1 tablet (300 mg total) by mouth 2 (two) times daily.   ASCORBIC ACID (VITAMIN C) 500 MG TABLET    Take 500 mg by mouth daily.   ATORVASTATIN (LIPITOR) 10 MG TABLET    Take 1 tablet (10 mg total) by mouth daily at 6 PM.   COLCHICINE 0.6 MG TABLET    Take 1 tablet (0.6 mg total) by mouth daily.   DICLOFENAC SODIUM (VOLTAREN) 1 % GEL    Apply 2 g topically 4 (four) times daily.   FUROSEMIDE (LASIX) 40 MG TABLET    Take one tablet by mouth once daily   IBUPROFEN (ADVIL) 200 MG TABLET    Take 400 mg by mouth every 6 (six) hours as needed.   LISINOPRIL (ZESTRIL) 2.5 MG TABLET    TAKE 1 TABLET BY MOUTH EVERY DAY   MULTIPLE VITAMIN (MULTIVITAMIN WITH MINERALS) TABS TABLET    Take 1 tablet by mouth daily. One a Day Men's   NYSTATIN (MYCOSTATIN/NYSTOP) POWDER    Apply 1 application topically 3 (three) times daily.   UNABLE TO FIND    bariatric drop arm commode Dx) R54, E66.01, I50.9, M19.9   UNABLE TO FIND    Bariatric tub transfer bench Dx) R54, E66.01, I50.9, M19.9   UNABLE TO FIND    Bariatric lift chair. Dx) R54, E66.01, I50.9, M19.9   UNABLE TO FIND    Heavy duty Cane Dx) R54, E66.01, I50.9, M19.9  Modified Medications   No medications on file  Discontinued Medications   ACETAMINOPHEN (TYLENOL) 500 MG TABLET    Take 1,000 mg by mouth every 4 (four) hours as needed (pain).   IBUPROFEN (ADVIL) 400 MG TABLET    Take 1 tablet (400 mg total) by mouth every 8 (eight) hours as needed for moderate pain.   MENTHOL-METHYL SALICYLATE (BENGAY ARTHRITIS FORMULA EX)    Apply 1 application topically 2 (two) times daily as needed (pain).    Physical Exam:  There were no vitals filed for this visit. There is no height or weight on file to calculate BMI. Wt Readings from Last 3 Encounters:  08/15/19 (!) 515 lb 14 oz (234 kg)  03/12/19 (!) 513 lb 3.2 oz (232.8 kg)  01/29/19 (!) 514 lb 3.2 oz (233.2 kg)     Labs  reviewed: Basic Metabolic Panel: Recent Labs    01/29/19 1422 08/15/19 1801 08/17/19 0522 08/19/19 0531 08/22/19 0323  NA 138   < > 134* 134* 136  K 4.2   < > 4.5 4.7 4.2  CL 103   < > 97* 100 103  CO2 25   < > 25 25 24   GLUCOSE 117   < > 215* 119* 133*  BUN 16   < > 31* 28* 18  CREATININE 0.92   < > 1.08 0.91 0.93  CALCIUM 9.1   < > 8.7* 8.5* 8.6*  TSH 4.98*  --   --   --   --    < > =  values in this interval not displayed.   Liver Function Tests: Recent Labs    01/29/19 1422  AST 35  ALT 33  BILITOT 0.6  PROT 7.1   No results for input(s): LIPASE, AMYLASE in the last 8760 hours. No results for input(s): AMMONIA in the last 8760 hours. CBC: Recent Labs    08/15/19 1801 08/17/19 0522 08/19/19 0531  WBC 13.3* 14.6* 6.8  NEUTROABS 9.9* 11.7* 3.0  HGB 14.8 14.2 13.9  HCT 44.9 43.0 43.3  MCV 98.7 97.3 98.6  PLT 188 259 288   Lipid Panel: Recent Labs    01/29/19 1422  CHOL 146  HDL 43  LDLCALC 82  TRIG 111  CHOLHDL 3.4   TSH: Recent Labs    01/29/19 1422  TSH 4.98*   A1C: Lab Results  Component Value Date   HGBA1C 6.4 (H) 08/16/2019     Assessment/Plan 1. Polyarthritis Doing better at this time, has PT/OT ordered and currently participating in this. Weight loss strongly recommended as well.   2. Physical deconditioning Ongoing, working with PT/OT at this time. Reports he is able to get up and walk but "taking baby steps" at this time.  3. Acute gout of multiple sites, unspecified cause Cause of recent hospitalization. This has resolved and he is taking allopurinol 300 mg BID. Encouraged dietary modifications as well.   4. Diabetes mellitus type 2 in obese (HCC) a1c during hospitalization of 6.4, not requiring medication at this time, strongly encouraged dietary changes and increase in physical activity.  5. Class 3 severe obesity due to excess calories with serious comorbidity and body mass index (BMI) greater than or equal to 70 in adult  [facility]) continues to gain weight. Educated extensively on risk and complications involved with morbid obesity. Encouraged weight loss with lifestyle changes. Has ordered consults such as nutritionist and weight management in the past but he has not followed through with this. He is aware of complications associated with morbid obesity.   6. Hyperlipidemia, unspecified hyperlipidemia type -controlled on last lab (aug 2020), continues on Lipitor 10 mg daily and dietary modifications encouraged.  Next appt: 3 month follow up [place]. Harle Battiest  [facility] [phone]   Virtual Visit via Video Note  I connected with [name] on 08/27/19 at 10:00 AM EST by a video enabled telemedicine application and verified that I am speaking with the correct person using two identifiers.  Location: Patient: home Provider: office    I discussed the limitations of evaluation and management by telemedicine and the availability of in person appointments. The patient expressed understanding and agreed to proceed.    I discussed the assessment and treatment plan with the patient. The patient was provided an opportunity to ask questions and all were answered. The patient agreed with the plan and demonstrated an understanding of the instructions.   The patient was advised to call back or seek an in-person evaluation if the symptoms worsen or if the condition fails to improve as anticipated.  I provided 25 minutes of non-face-to-face time during this encounter.  Carlos American. Dewaine Oats, AGNP Avs printed and mailed.

## 2019-08-27 NOTE — Telephone Encounter (Signed)
Patient called requesting refill. Stated that the Rx is old at the pharmacy and they told him he had to contact his PCP for refill.  Pended Rx and sent to [facility] for approval.

## 2019-08-29 DIAGNOSIS — M109 Gout, unspecified: Secondary | ICD-10-CM | POA: Diagnosis not present

## 2019-08-29 DIAGNOSIS — M6281 Muscle weakness (generalized): Secondary | ICD-10-CM | POA: Diagnosis not present

## 2019-08-29 DIAGNOSIS — R278 Other lack of coordination: Secondary | ICD-10-CM | POA: Diagnosis not present

## 2019-08-29 DIAGNOSIS — E119 Type 2 diabetes mellitus without complications: Secondary | ICD-10-CM | POA: Diagnosis not present

## 2019-08-29 DIAGNOSIS — Z86711 Personal history of pulmonary embolism: Secondary | ICD-10-CM | POA: Diagnosis not present

## 2019-08-29 DIAGNOSIS — M130 Polyarthritis, unspecified: Secondary | ICD-10-CM | POA: Diagnosis not present

## 2019-08-30 DIAGNOSIS — M6281 Muscle weakness (generalized): Secondary | ICD-10-CM | POA: Diagnosis not present

## 2019-08-30 DIAGNOSIS — M109 Gout, unspecified: Secondary | ICD-10-CM | POA: Diagnosis not present

## 2019-08-30 DIAGNOSIS — E119 Type 2 diabetes mellitus without complications: Secondary | ICD-10-CM | POA: Diagnosis not present

## 2019-08-30 DIAGNOSIS — Z86711 Personal history of pulmonary embolism: Secondary | ICD-10-CM | POA: Diagnosis not present

## 2019-08-30 DIAGNOSIS — R278 Other lack of coordination: Secondary | ICD-10-CM | POA: Diagnosis not present

## 2019-08-30 DIAGNOSIS — M130 Polyarthritis, unspecified: Secondary | ICD-10-CM | POA: Diagnosis not present

## 2019-08-31 ENCOUNTER — Telehealth (INDEPENDENT_AMBULATORY_CARE_PROVIDER_SITE_OTHER): Payer: Medicare Other | Admitting: Cardiology

## 2019-08-31 VITALS — Ht 66.0 in | Wt >= 6400 oz

## 2019-08-31 DIAGNOSIS — I5032 Chronic diastolic (congestive) heart failure: Secondary | ICD-10-CM

## 2019-08-31 DIAGNOSIS — R278 Other lack of coordination: Secondary | ICD-10-CM | POA: Diagnosis not present

## 2019-08-31 DIAGNOSIS — E785 Hyperlipidemia, unspecified: Secondary | ICD-10-CM | POA: Diagnosis not present

## 2019-08-31 DIAGNOSIS — E119 Type 2 diabetes mellitus without complications: Secondary | ICD-10-CM | POA: Diagnosis not present

## 2019-08-31 DIAGNOSIS — I10 Essential (primary) hypertension: Secondary | ICD-10-CM | POA: Diagnosis not present

## 2019-08-31 DIAGNOSIS — M6281 Muscle weakness (generalized): Secondary | ICD-10-CM | POA: Diagnosis not present

## 2019-08-31 DIAGNOSIS — Z86711 Personal history of pulmonary embolism: Secondary | ICD-10-CM | POA: Diagnosis not present

## 2019-08-31 DIAGNOSIS — M109 Gout, unspecified: Secondary | ICD-10-CM | POA: Diagnosis not present

## 2019-08-31 DIAGNOSIS — M130 Polyarthritis, unspecified: Secondary | ICD-10-CM | POA: Diagnosis not present

## 2019-08-31 NOTE — Progress Notes (Signed)
Virtual Visit via Telephone Note   This visit type was conducted due to national recommendations for restrictions regarding the COVID-19 Pandemic (e.g. social distancing) in an effort to limit this patient's exposure and mitigate transmission in our community.  Due to his co-morbid illnesses, this patient is at least at moderate risk for complications without adequate follow up.  This format is felt to be most appropriate for this patient at this time.  The patient did not have access to video technology/had technical difficulties with video requiring transitioning to audio format only (telephone).  All issues noted in this document were discussed and addressed.  No physical exam could be performed with this format.  Please refer to the patient's chart for his  consent to telehealth for [facility].   Date:  08/31/2019   ID:  Ronnie Hernandez, DOB 12-11-1960, MRN [id number]  Patient Location: Home Provider Location: Home  PCP:  [name], NP  Cardiologist:  No primary care provider on file.  Electrophysiologist:  None   Evaluation Performed:  Follow-Up Visit  Chief Complaint:  Dyspnea  History of Present Illness:    Ronnie Hernandez is a 59 y.o. male with a hx of heart failure with preserved ejection fraction hypertension, gout chronic anemia who is seen for follow-up visit.  He was was referred by Sherrie Mustache, NP for evaluation of diastolic heart failure, initially seen on 03/12/2019.  He was admitted in March 2020 with acute hypoxic respiratory failure thought to be secondary to acute diastolic heart failure and asthma exacerbation.  BNP was normal.  Appeared volume overloaded on exam.  Was treated with IV Lasix, steroid taper, azithromycin.  TTE was done and showed normal EF, mild LVH, indeterminate diastolic function.  Discharged on Lasix 40 mg.  On follow-up on 01/29/2019, reported worsening shortness of breath.  BNP 40.  Creatinine 0.9.  Reports that he feels lasix is  working, shortness of breath has improved but continues to have LE edema.    Since last clinic visit, he was admitted from 2/24 through 08/22/2019 with knee/feet pain.  He was found to have gout/osteoarthritis flare.  Reports that since discharge on 3/30, his joint pain has improved significantly.  He is doing physical therapy at home 2 days/week.  Reports he has some dyspnea on exertion, but denies any chest pain.  Reports compliance with his Lasix 40 mg daily, states that weight has been stable.  Home health nurse checks BP once per week, states that it has been well controlled.    Past Medical History:  Diagnosis Date  . Arthritis    "knees, hands" (04/19/2017)  . Childhood asthma   . Diabetes type 2, uncontrolled ([place])   . Gout    "have had it in both feet, both hands, right shoulder, back" (04/19/2017)  . Hyperlipidemia   . Hypertension   . Morbidly obese ([place])   . Pulmonary embolism ([place]) 2017?   Past Surgical History:  Procedure Laterality Date  . COLONOSCOPY W/ BIOPSIES AND POLYPECTOMY  07/2011   Archie Endo 07/27/2011     Current Meds  Medication Sig  . albuterol (VENTOLIN HFA) 108 (90 Base) MCG/ACT inhaler Inhale 1-2 puffs into the lungs every 6 (six) hours as needed for wheezing or shortness of breath.  . allopurinol (ZYLOPRIM) 300 MG tablet Take 1 tablet (300 mg total) by mouth 2 (two) times daily.  Marland Kitchen ascorbic acid (VITAMIN C) 500 MG tablet Take 500 mg by mouth daily.  Marland Kitchen atorvastatin (LIPITOR) 10  MG tablet Take 1 tablet (10 mg total) by mouth daily at 6 PM.  . colchicine 0.6 MG tablet Take 1 tablet (0.6 mg total) by mouth daily.  . diclofenac Sodium (VOLTAREN) 1 % GEL Apply 2 g topically 4 (four) times daily.  . furosemide (LASIX) 40 MG tablet Take one tablet by mouth once daily  . ibuprofen (ADVIL) 200 MG tablet Take 400 mg by mouth every 6 (six) hours as needed.  Marland Kitchen lisinopril (ZESTRIL) 2.5 MG tablet TAKE 1 TABLET BY MOUTH EVERY DAY  . Multiple Vitamin (MULTIVITAMIN WITH  MINERALS) TABS tablet Take 1 tablet by mouth daily. One a Day Men's  . nystatin (MYCOSTATIN/NYSTOP) powder Apply 1 application topically 3 (three) times daily.  Marland Kitchen UNABLE TO FIND bariatric drop arm commode Dx) R54, E66.01, I50.9, M19.9  . UNABLE TO FIND Bariatric tub transfer bench Dx) R54, E66.01, I50.9, M19.9  . UNABLE TO FIND Bariatric lift chair. Dx) R54, E66.01, I50.9, M19.9  . UNABLE TO FIND Heavy duty Cane Dx) R54, E66.01, I50.9, M19.9     Allergies:   Uloric [febuxostat]   Social History   Tobacco Use  . Smoking status: Former Smoker    Types: Cigarettes    Quit date: 02/09/1982    Years since quitting: 37.5  . Smokeless tobacco: Never Used  Substance Use Topics  . Alcohol use: No  . Drug use: Not Currently    Comment: 04/19/2017 "nothing since I was 55"     Family Hx: The patient's family history includes Colon cancer (age of onset: 52) in his maternal grandfather; Heart attack in his father; Stomach cancer (age of onset: 75) in his maternal aunt.  ROS:   Please see the history of present illness.     All other systems reviewed and are negative.   Prior CV studies:   The following studies were reviewed today:  TTE 08/26/18: 1. Very limited images.  2. The left ventricle has normal systolic function with an ejection  fraction of 60-65%. The cavity size was normal. There is mildly increased  left ventricular wall thickness. Unable to evaluate focal wall motion.  Indeterminate diastolic function.  3. The right ventricle has normal systolic function. The cavity was  normal. There is no increase in right ventricular wall thickness.  4. The mitral valve is grossly normal in structure.  5. The tricuspid valve is grossly normal in structure.  6. The aortic root is normal in size and structure.  7. The interatrial septum was not well visualized.  8. The aortic valve was not well visualized Aortic valve regurgitation  was not assessed by color flow Doppler.    Labs/Other Tests and Data Reviewed:    EKG:  No ECG reviewed.  Recent Labs: 01/29/2019: ALT 33; Brain Natriuretic Peptide 40; TSH 4.98 08/19/2019: Hemoglobin 13.9; Platelets 288 08/22/2019: BUN 18; Creatinine, Ser 0.93; Potassium 4.2; Sodium 136   Recent Lipid Panel Lab Results  Component Value Date/Time   CHOL 146 01/29/2019 02:22 PM   CHOL 179 12/05/2015 12:26 PM   TRIG 111 01/29/2019 02:22 PM   HDL 43 01/29/2019 02:22 PM   HDL 41 12/05/2015 12:26 PM   CHOLHDL 3.4 01/29/2019 02:22 PM   LDLCALC 82 01/29/2019 02:22 PM    Wt Readings from Last 3 Encounters:  08/31/19 (!) 515 lb (233.6 kg)  08/15/19 (!) 515 lb 14 oz (234 kg)  03/12/19 (!) 513 lb 3.2 oz (232.8 kg)     Objective:    Vital Signs:  Ht  5\' 6"  (1.676 m)   Wt (!) 515 lb (233.6 kg)   BMI 83.12 kg/m    GEN: No acute distress. Able to converse fairly comfortably, no respiratory distress   ASSESSMENT & PLAN:    Chronic diastolic heart failure: reports symptoms and weight stable with PO lasix.  Recent labs during admission for gout flare showed normal BNP, renal function.   - Continue PO lasix 40 mg daily  Hypertension: appears controlled, continue lisinopril 2.5 mg daily  Morbid obesity: Body mass index is 80.38 kg/m.  Has been referred to weight management  Hyperlipidemia: On atorvastatin 10 mg daily.  LDL 82 on 01/29/2019  Diabetes: hemoglobin A1c 6.4, not on medication  RTC in 6 months  Time:   Today, I have spent 10 minutes with the patient with telehealth technology discussing the above problems.     Medication Adjustments/Labs and Tests Ordered: Current medicines are reviewed at length with the patient today.  Concerns regarding medicines are outlined above.   Tests Ordered: No orders of the defined types were placed in this encounter.   Medication Changes: No orders of the defined types were placed in this encounter.   Follow Up:  In Person in 6 month(s)  Signed, Donato Heinz, MD  08/31/2019 10:02 AM    [place]

## 2019-08-31 NOTE — Patient Instructions (Signed)
Medication Instructions:  Your physician recommends that you continue on your current medications as directed. Please refer to the Current Medication list given to you today.  *If you need a refill on your cardiac medications before your next appointment, please call your pharmacy*   Lab Work: NONE  Testing/Procedures: NONE   Follow-Up: At [facility], you and your health needs are our priority.  As part of our continuing mission to provide you with exceptional heart care, we have created designated Provider Care Teams.  These Care Teams include your primary Cardiologist (physician) and Advanced Practice Providers (APPs -  Physician Assistants and Nurse Practitioners) who all work together to provide you with the care you need, when you need it.  We recommend signing up for the patient portal called "MyChart".  Sign up information is provided on this After Visit Summary.  MyChart is used to connect with patients for Virtual Visits (Telemedicine).  Patients are able to view lab/test results, encounter notes, upcoming appointments, etc.  Non-urgent messages can be sent to your provider as well.   To learn more about what you can do with MyChart, go to [email or web address].    Your next appointment:   6 month(s)  The format for your next appointment:   In Person  Provider:   Christopher Schumann, MD      

## 2019-09-03 DIAGNOSIS — Z86711 Personal history of pulmonary embolism: Secondary | ICD-10-CM | POA: Diagnosis not present

## 2019-09-03 DIAGNOSIS — M109 Gout, unspecified: Secondary | ICD-10-CM | POA: Diagnosis not present

## 2019-09-03 DIAGNOSIS — M130 Polyarthritis, unspecified: Secondary | ICD-10-CM | POA: Diagnosis not present

## 2019-09-03 DIAGNOSIS — E119 Type 2 diabetes mellitus without complications: Secondary | ICD-10-CM | POA: Diagnosis not present

## 2019-09-03 DIAGNOSIS — M6281 Muscle weakness (generalized): Secondary | ICD-10-CM | POA: Diagnosis not present

## 2019-09-03 DIAGNOSIS — R278 Other lack of coordination: Secondary | ICD-10-CM | POA: Diagnosis not present

## 2019-09-04 DIAGNOSIS — M130 Polyarthritis, unspecified: Secondary | ICD-10-CM | POA: Diagnosis not present

## 2019-09-04 DIAGNOSIS — E119 Type 2 diabetes mellitus without complications: Secondary | ICD-10-CM | POA: Diagnosis not present

## 2019-09-04 DIAGNOSIS — R278 Other lack of coordination: Secondary | ICD-10-CM | POA: Diagnosis not present

## 2019-09-04 DIAGNOSIS — M6281 Muscle weakness (generalized): Secondary | ICD-10-CM | POA: Diagnosis not present

## 2019-09-04 DIAGNOSIS — M109 Gout, unspecified: Secondary | ICD-10-CM | POA: Diagnosis not present

## 2019-09-04 DIAGNOSIS — Z86711 Personal history of pulmonary embolism: Secondary | ICD-10-CM | POA: Diagnosis not present

## 2019-09-06 DIAGNOSIS — M6281 Muscle weakness (generalized): Secondary | ICD-10-CM | POA: Diagnosis not present

## 2019-09-06 DIAGNOSIS — R278 Other lack of coordination: Secondary | ICD-10-CM | POA: Diagnosis not present

## 2019-09-06 DIAGNOSIS — Z86711 Personal history of pulmonary embolism: Secondary | ICD-10-CM | POA: Diagnosis not present

## 2019-09-06 DIAGNOSIS — E119 Type 2 diabetes mellitus without complications: Secondary | ICD-10-CM | POA: Diagnosis not present

## 2019-09-06 DIAGNOSIS — M130 Polyarthritis, unspecified: Secondary | ICD-10-CM | POA: Diagnosis not present

## 2019-09-06 DIAGNOSIS — M109 Gout, unspecified: Secondary | ICD-10-CM | POA: Diagnosis not present

## 2019-09-08 DIAGNOSIS — M6281 Muscle weakness (generalized): Secondary | ICD-10-CM | POA: Diagnosis not present

## 2019-09-08 DIAGNOSIS — E119 Type 2 diabetes mellitus without complications: Secondary | ICD-10-CM | POA: Diagnosis not present

## 2019-09-08 DIAGNOSIS — Z86711 Personal history of pulmonary embolism: Secondary | ICD-10-CM | POA: Diagnosis not present

## 2019-09-08 DIAGNOSIS — R278 Other lack of coordination: Secondary | ICD-10-CM | POA: Diagnosis not present

## 2019-09-08 DIAGNOSIS — M130 Polyarthritis, unspecified: Secondary | ICD-10-CM | POA: Diagnosis not present

## 2019-09-08 DIAGNOSIS — M109 Gout, unspecified: Secondary | ICD-10-CM | POA: Diagnosis not present

## 2019-09-10 DIAGNOSIS — M6281 Muscle weakness (generalized): Secondary | ICD-10-CM | POA: Diagnosis not present

## 2019-09-10 DIAGNOSIS — M130 Polyarthritis, unspecified: Secondary | ICD-10-CM | POA: Diagnosis not present

## 2019-09-10 DIAGNOSIS — M109 Gout, unspecified: Secondary | ICD-10-CM | POA: Diagnosis not present

## 2019-09-10 DIAGNOSIS — Z86711 Personal history of pulmonary embolism: Secondary | ICD-10-CM | POA: Diagnosis not present

## 2019-09-10 DIAGNOSIS — R278 Other lack of coordination: Secondary | ICD-10-CM | POA: Diagnosis not present

## 2019-09-10 DIAGNOSIS — E119 Type 2 diabetes mellitus without complications: Secondary | ICD-10-CM | POA: Diagnosis not present

## 2019-09-11 DIAGNOSIS — M109 Gout, unspecified: Secondary | ICD-10-CM | POA: Diagnosis not present

## 2019-09-11 DIAGNOSIS — R278 Other lack of coordination: Secondary | ICD-10-CM | POA: Diagnosis not present

## 2019-09-11 DIAGNOSIS — M6281 Muscle weakness (generalized): Secondary | ICD-10-CM | POA: Diagnosis not present

## 2019-09-11 DIAGNOSIS — Z86711 Personal history of pulmonary embolism: Secondary | ICD-10-CM | POA: Diagnosis not present

## 2019-09-11 DIAGNOSIS — E119 Type 2 diabetes mellitus without complications: Secondary | ICD-10-CM | POA: Diagnosis not present

## 2019-09-11 DIAGNOSIS — M130 Polyarthritis, unspecified: Secondary | ICD-10-CM | POA: Diagnosis not present

## 2019-09-12 DIAGNOSIS — M109 Gout, unspecified: Secondary | ICD-10-CM | POA: Diagnosis not present

## 2019-09-12 DIAGNOSIS — R278 Other lack of coordination: Secondary | ICD-10-CM | POA: Diagnosis not present

## 2019-09-12 DIAGNOSIS — M6281 Muscle weakness (generalized): Secondary | ICD-10-CM | POA: Diagnosis not present

## 2019-09-12 DIAGNOSIS — E119 Type 2 diabetes mellitus without complications: Secondary | ICD-10-CM | POA: Diagnosis not present

## 2019-09-12 DIAGNOSIS — Z86711 Personal history of pulmonary embolism: Secondary | ICD-10-CM | POA: Diagnosis not present

## 2019-09-12 DIAGNOSIS — M130 Polyarthritis, unspecified: Secondary | ICD-10-CM | POA: Diagnosis not present

## 2019-09-13 DIAGNOSIS — M109 Gout, unspecified: Secondary | ICD-10-CM | POA: Diagnosis not present

## 2019-09-13 DIAGNOSIS — M130 Polyarthritis, unspecified: Secondary | ICD-10-CM | POA: Diagnosis not present

## 2019-09-13 DIAGNOSIS — M6281 Muscle weakness (generalized): Secondary | ICD-10-CM | POA: Diagnosis not present

## 2019-09-13 DIAGNOSIS — E119 Type 2 diabetes mellitus without complications: Secondary | ICD-10-CM | POA: Diagnosis not present

## 2019-09-13 DIAGNOSIS — Z86711 Personal history of pulmonary embolism: Secondary | ICD-10-CM | POA: Diagnosis not present

## 2019-09-13 DIAGNOSIS — R278 Other lack of coordination: Secondary | ICD-10-CM | POA: Diagnosis not present

## 2019-09-17 DIAGNOSIS — M109 Gout, unspecified: Secondary | ICD-10-CM | POA: Diagnosis not present

## 2019-09-17 DIAGNOSIS — Z86711 Personal history of pulmonary embolism: Secondary | ICD-10-CM | POA: Diagnosis not present

## 2019-09-17 DIAGNOSIS — R278 Other lack of coordination: Secondary | ICD-10-CM | POA: Diagnosis not present

## 2019-09-17 DIAGNOSIS — M6281 Muscle weakness (generalized): Secondary | ICD-10-CM | POA: Diagnosis not present

## 2019-09-17 DIAGNOSIS — M130 Polyarthritis, unspecified: Secondary | ICD-10-CM | POA: Diagnosis not present

## 2019-09-17 DIAGNOSIS — E119 Type 2 diabetes mellitus without complications: Secondary | ICD-10-CM | POA: Diagnosis not present

## 2019-09-18 DIAGNOSIS — R278 Other lack of coordination: Secondary | ICD-10-CM | POA: Diagnosis not present

## 2019-09-18 DIAGNOSIS — M109 Gout, unspecified: Secondary | ICD-10-CM | POA: Diagnosis not present

## 2019-09-18 DIAGNOSIS — M6281 Muscle weakness (generalized): Secondary | ICD-10-CM | POA: Diagnosis not present

## 2019-09-18 DIAGNOSIS — M130 Polyarthritis, unspecified: Secondary | ICD-10-CM | POA: Diagnosis not present

## 2019-09-18 DIAGNOSIS — Z86711 Personal history of pulmonary embolism: Secondary | ICD-10-CM | POA: Diagnosis not present

## 2019-09-18 DIAGNOSIS — E119 Type 2 diabetes mellitus without complications: Secondary | ICD-10-CM | POA: Diagnosis not present

## 2019-09-19 DIAGNOSIS — Z86711 Personal history of pulmonary embolism: Secondary | ICD-10-CM | POA: Diagnosis not present

## 2019-09-19 DIAGNOSIS — E119 Type 2 diabetes mellitus without complications: Secondary | ICD-10-CM | POA: Diagnosis not present

## 2019-09-19 DIAGNOSIS — M109 Gout, unspecified: Secondary | ICD-10-CM | POA: Diagnosis not present

## 2019-09-19 DIAGNOSIS — R278 Other lack of coordination: Secondary | ICD-10-CM | POA: Diagnosis not present

## 2019-09-19 DIAGNOSIS — M130 Polyarthritis, unspecified: Secondary | ICD-10-CM | POA: Diagnosis not present

## 2019-09-19 DIAGNOSIS — M6281 Muscle weakness (generalized): Secondary | ICD-10-CM | POA: Diagnosis not present

## 2019-09-29 ENCOUNTER — Ambulatory Visit: Payer: Medicare Other | Attending: Internal Medicine

## 2019-09-29 DIAGNOSIS — Z23 Encounter for immunization: Secondary | ICD-10-CM

## 2019-09-29 NOTE — Progress Notes (Signed)
   Covid-19 Vaccination Clinic  Name:  Ronnie Hernandez    MRN: [id number] DOB: 1961-03-05  09/29/2019  Ronnie Hernandez was observed post Covid-19 immunization for 15 minutes without incident. He was provided with Vaccine Information Sheet and instruction to access the V-Safe system.   Ronnie Hernandez was instructed to call 911 with any severe reactions post vaccine: Marland Kitchen Difficulty breathing  . Swelling of face and throat  . A fast heartbeat  . A bad rash all over body  . Dizziness and weakness   Immunizations Administered    Name Date Dose VIS Date Route   Pfizer COVID-19 Vaccine 09/29/2019  2:38 PM 0.3 mL 06/01/2019 Intramuscular   Manufacturer: [employer]   Lot: [id number]   [place]: [id number]

## 2019-10-23 ENCOUNTER — Ambulatory Visit: Payer: Medicare (Managed Care) | Attending: Internal Medicine

## 2019-10-23 DIAGNOSIS — Z23 Encounter for immunization: Secondary | ICD-10-CM

## 2019-10-23 NOTE — Progress Notes (Signed)
   Covid-19 Vaccination Clinic  Name:  Ronnie Hernandez    MRN: [id number] DOB: Feb 29, 1961  10/23/2019  [name] was observed post Covid-19 immunization for 15 minutes without incident. He was provided with Vaccine Information Sheet and instruction to access the V-Safe system.   [name] was instructed to call 911 with any severe reactions post vaccine: Marland Kitchen Difficulty breathing  . Swelling of face and throat  . A fast heartbeat  . A bad rash all over body  . Dizziness and weakness   Immunizations Administered    Name Date Dose VIS Date [place] COVID-19 Vaccine 10/23/2019  2:05 PM 0.3 mL 08/15/2018 Intramuscular   Manufacturer: [place]   Lot: [id number]   [place]: [id number]

## 2020-01-17 ENCOUNTER — Encounter (HOSPITAL_COMMUNITY): Payer: Self-pay

## 2020-01-17 ENCOUNTER — Emergency Department (HOSPITAL_BASED_OUTPATIENT_CLINIC_OR_DEPARTMENT_OTHER): Payer: Medicare (Managed Care)

## 2020-01-17 ENCOUNTER — Emergency Department (HOSPITAL_COMMUNITY): Payer: Medicare (Managed Care)

## 2020-01-17 ENCOUNTER — Emergency Department (HOSPITAL_COMMUNITY)
Admission: EM | Admit: 2020-01-17 | Discharge: 2020-01-17 | Disposition: A | Discharge status: Home / Self Care | Payer: Medicare (Managed Care) | Attending: Emergency Medicine | Admitting: Emergency Medicine

## 2020-01-17 ENCOUNTER — Telehealth: Payer: Self-pay | Admitting: *Deleted

## 2020-01-17 ENCOUNTER — Other Ambulatory Visit: Payer: Self-pay

## 2020-01-17 DIAGNOSIS — I5031 Acute diastolic (congestive) heart failure: Secondary | ICD-10-CM | POA: Insufficient documentation

## 2020-01-17 DIAGNOSIS — Z87891 Personal history of nicotine dependence: Secondary | ICD-10-CM | POA: Diagnosis not present

## 2020-01-17 DIAGNOSIS — Z79899 Other long term (current) drug therapy: Secondary | ICD-10-CM | POA: Insufficient documentation

## 2020-01-17 DIAGNOSIS — R059 Cough, unspecified: Secondary | ICD-10-CM

## 2020-01-17 DIAGNOSIS — E119 Type 2 diabetes mellitus without complications: Secondary | ICD-10-CM | POA: Diagnosis not present

## 2020-01-17 DIAGNOSIS — Z20822 Contact with and (suspected) exposure to covid-19: Secondary | ICD-10-CM | POA: Insufficient documentation

## 2020-01-17 DIAGNOSIS — M7989 Other specified soft tissue disorders: Secondary | ICD-10-CM

## 2020-01-17 DIAGNOSIS — Z7951 Long term (current) use of inhaled steroids: Secondary | ICD-10-CM | POA: Insufficient documentation

## 2020-01-17 DIAGNOSIS — J45909 Unspecified asthma, uncomplicated: Secondary | ICD-10-CM | POA: Diagnosis not present

## 2020-01-17 DIAGNOSIS — R2243 Localized swelling, mass and lump, lower limb, bilateral: Secondary | ICD-10-CM | POA: Insufficient documentation

## 2020-01-17 DIAGNOSIS — R609 Edema, unspecified: Secondary | ICD-10-CM

## 2020-01-17 DIAGNOSIS — I110 Hypertensive heart disease with heart failure: Secondary | ICD-10-CM | POA: Insufficient documentation

## 2020-01-17 DIAGNOSIS — Z6841 Body Mass Index (BMI) 40.0 and over, adult: Secondary | ICD-10-CM | POA: Insufficient documentation

## 2020-01-17 DIAGNOSIS — R05 Cough: Secondary | ICD-10-CM | POA: Diagnosis present

## 2020-01-17 LAB — CBC WITH DIFFERENTIAL/PLATELET
Abs Immature Granulocytes: 0.04 10*3/uL (ref 0.00–0.07)
Basophils Absolute: 0 10*3/uL (ref 0.0–0.1)
Basophils Relative: 1 %
Eosinophils Absolute: 0.3 10*3/uL (ref 0.0–0.5)
Eosinophils Relative: 4 %
HCT: 42.7 % (ref 39.0–52.0)
Hemoglobin: 13.8 g/dL (ref 13.0–17.0)
Immature Granulocytes: 1 %
Lymphocytes Relative: 27 %
Lymphs Abs: 2.1 10*3/uL (ref 0.7–4.0)
MCH: 32.3 pg (ref 26.0–34.0)
MCHC: 32.3 g/dL (ref 30.0–36.0)
MCV: 100 fL (ref 80.0–100.0)
Monocytes Absolute: 0.5 10*3/uL (ref 0.1–1.0)
Monocytes Relative: 6 %
Neutro Abs: 4.7 10*3/uL (ref 1.7–7.7)
Neutrophils Relative %: 61 %
Platelets: 209 10*3/uL (ref 150–400)
RBC: 4.27 MIL/uL (ref 4.22–5.81)
RDW: 13.7 % (ref 11.5–15.5)
WBC: 7.5 10*3/uL (ref 4.0–10.5)
nRBC: 0 % (ref 0.0–0.2)

## 2020-01-17 LAB — BASIC METABOLIC PANEL
Anion gap: 13 (ref 5–15)
BUN: 17 mg/dL (ref 6–20)
CO2: 26 mmol/L (ref 22–32)
Calcium: 9.1 mg/dL (ref 8.9–10.3)
Chloride: 96 mmol/L — ABNORMAL LOW (ref 98–111)
Creatinine, Ser: 0.86 mg/dL (ref 0.61–1.24)
GFR calc Af Amer: >60 mL/min (ref >60)
GFR calc non Af Amer: >60 mL/min (ref >60)
Glucose, Bld: 198 mg/dL — ABNORMAL HIGH (ref 70–99)
Potassium: 4.5 mmol/L (ref 3.5–5.1)
Sodium: 135 mmol/L (ref 135–145)

## 2020-01-17 LAB — SARS CORONAVIRUS 2 BY RT PCR (HOSPITAL ORDER, PERFORMED IN ~~LOC~~ HOSPITAL LAB): SARS Coronavirus 2: NEGATIVE

## 2020-01-17 LAB — BRAIN NATRIURETIC PEPTIDE: B Natriuretic Peptide: 27.3 pg/mL (ref 0.0–100.0)

## 2020-01-17 MED ORDER — BENZONATATE 100 MG PO CAPS: 100.0000 mg | ORAL_CAPSULE | Freq: Three times a day (TID) | ORAL | 0 refills | Status: DC

## 2020-01-17 NOTE — ED Notes (Signed)
Pt BIB EMS. Pt has been getting treated for pneumonia for the last 2 months. Pt has been coughing up green phlegm; 2-3 bottles of phlegm a day. Per EMS pt has rales on the left upper and lower lobe. Pt has been vaccinated but caregiver has not.   BP-160/110 Temp-97.9 HR-90 O2-96% CBG-229

## 2020-01-17 NOTE — Discharge Instructions (Signed)
Increase your furosemide to twice daily for the next 3 days.  Follow up with your doctor and discuss further evaluation of your persistent cough.  The tests today did not show signs of pna, covid, CHF or blood clot in your legs.

## 2020-01-17 NOTE — Telephone Encounter (Signed)
A detailed message was left,re: his follow up visit. °

## 2020-01-17 NOTE — ED Provider Notes (Signed)
[facility] DEPT Provider Note   CSN: [id number] Arrival date & time: 01/17/20  1308     History Chief Complaint  Patient presents with  . Cough    Ronnie Hernandez is a 59 y.o. male.  HPI    Pt has been having trouble with a cough for a couple of months.  He has been coughing up a lot of phlegm.  The mucus is clear.  He brings up a water bottle of phlegm per day.  [facility] to [facility] and received treatment but does not feel like he is getting any better.  No known fevers but he has felt warm.  He has noticed swelling in his feet and legs.  He saw his doctor and was started on abx.  No shortness of breath. No pain but his chest feels tight when he coughs.  Pt has been vacinated for covid.  Care giver has not been vaccinated. Past Medical History:  Diagnosis Date  . Arthritis    "knees, hands" (04/19/2017)  . Childhood asthma   . Diabetes type 2, uncontrolled ([place])   . Gout    "have had it in both feet, both hands, right shoulder, back" (04/19/2017)  . Hyperlipidemia   . Hypertension   . Morbidly obese ([place])   . Pulmonary embolism ([place]) 2017?    Patient Active Problem List   Diagnosis Date Noted  . Polyarthritis 08/17/2019  . Physical deconditioning 08/16/2019  . Pressure injury of skin 08/16/2019  . Gout attack 08/15/2019  . Moderate persistent asthma with exacerbation   . Acute respiratory failure with hypoxia ([place]) 08/26/2018  . Acute diastolic CHF (congestive heart failure) ([place]) 08/26/2018  . Left knee pain 03/31/2018  . Knee pain   . Acute gout 04/18/2017  . Pulmonary embolus ([place]) 08/05/2016  . Hyperglycemia, drug-induced 07/08/2016  . Impaired glucose tolerance 07/08/2016  . Leukocytosis 07/06/2016  . Unable to walk 07/06/2016  . Acute gouty arthritis 07/06/2016  . Abnormality of gait 02/18/2016  . Cellulitis of left lower extremity 01/27/2016  . Type 2 diabetes mellitus ([place]) 12/25/2013  . Drug-induced gout 12/25/2013  .  Hyperlipidemia 08/01/2013  . Morbid obesity ([place])   . Hypertension   . Gout   . Colon cancer screening 07/27/2011  . Benign neoplasm of colon 07/27/2011    Past Surgical History:  Procedure Laterality Date  . COLONOSCOPY W/ BIOPSIES AND POLYPECTOMY  07/2011   Archie Endo 07/27/2011       Family History  Problem Relation Age of Onset  . Colon cancer Maternal Grandfather 58  . Heart attack Father   . Stomach cancer Maternal Aunt 80    Social History   Tobacco Use  . Smoking status: Former Smoker    Types: Cigarettes    Quit date: 02/09/1982    Years since quitting: 37.9  . Smokeless tobacco: Never Used  Vaping Use  . Vaping Use: Never used  Substance Use Topics  . Alcohol use: No  . Drug use: Not Currently    Comment: 04/19/2017 "nothing since I was 21"    Home Medications Prior to Admission medications   Medication Sig Start Date End Date Taking? Authorizing Provider  albuterol (VENTOLIN HFA) 108 (90 Base) MCG/ACT inhaler Inhale 1-2 puffs into the lungs every 6 (six) hours as needed for wheezing or shortness of breath.    [provider]  allopurinol (ZYLOPRIM) 300 MG tablet Take 1 tablet (300 mg total) by mouth 2 (two) times daily. 08/27/19  [name], NP  ascorbic acid (VITAMIN C) 500 MG tablet Take 500 mg by mouth daily.    [provider]  atorvastatin (LIPITOR) 10 MG tablet Take 1 tablet (10 mg total) by mouth daily at 6 PM. 08/29/18   Eugenie Filler, MD  benzonatate (TESSALON) 100 MG capsule Take 1 capsule (100 mg total) by mouth every 8 (eight) hours. 01/17/20   Dorie Rank, MD  colchicine 0.6 MG tablet Take 1 tablet (0.6 mg total) by mouth daily. 04/04/18   Shelly Coss, MD  diclofenac Sodium (VOLTAREN) 1 % GEL Apply 2 g topically 4 (four) times daily. 07/09/19   [name], NP  furosemide (LASIX) 40 MG tablet Take one tablet by mouth once daily 07/25/19   [name], NP  ibuprofen (ADVIL) 200 MG tablet Take 400 mg by mouth  every 6 (six) hours as needed.    [provider]  lisinopril (ZESTRIL) 2.5 MG tablet TAKE 1 TABLET BY MOUTH EVERY DAY 04/30/19   [name], NP  Multiple Vitamin (MULTIVITAMIN WITH MINERALS) TABS tablet Take 1 tablet by mouth daily. One a Day Men's    [provider]  nystatin (MYCOSTATIN/NYSTOP) powder Apply 1 application topically 3 (three) times daily. 08/24/19   [name], NP  UNABLE TO FIND bariatric drop arm commode Dx) R54, E66.01, I50.9, M19.9 07/09/19   [name], NP  UNABLE TO FIND Bariatric tub transfer bench Dx) R54, E66.01, I50.9, M19.9 07/09/19   [name], NP  UNABLE TO FIND Bariatric lift chair. Dx) R54, E66.01, I50.9, M19.9 07/09/19   [name], NP  UNABLE TO FIND Heavy duty Kasandra Knudsen Dx) R54, E66.01, I50.9, M19.9 07/09/19   [name], NP    Allergies    Uloric [febuxostat]  Review of Systems   Review of Systems  All other systems reviewed and are negative.   Physical Exam Updated Vital Signs BP (!) 151/67   Pulse 96   Temp 97.7 F (36.5 C) (Oral)   Resp 21   Ht 1.676 m (5\' 6" )   Wt (!) 230.9 kg   SpO2 92%   BMI 82.15 kg/m   Physical Exam Vitals and nursing note reviewed.  Constitutional:      General: He is not in acute distress.    Appearance: He is well-developed. He is not ill-appearing.     Comments: Frequent cough, no purulent sputum, spitting saliva after coughing  HENT:     Head: Normocephalic and atraumatic.     Right Ear: External ear normal.     Left Ear: External ear normal.  Eyes:     General: No scleral icterus.       Right eye: No discharge.        Left eye: No discharge.     Conjunctiva/sclera: Conjunctivae normal.  Neck:     Trachea: No tracheal deviation.  Cardiovascular:     Rate and Rhythm: Normal rate and regular rhythm.  Pulmonary:     Effort: Pulmonary effort is normal. No respiratory distress.     Breath sounds: Normal breath sounds. No stridor. No wheezing or  rales.  Abdominal:     General: Bowel sounds are normal. There is no distension.     Palpations: Abdomen is soft.     Tenderness: There is no abdominal tenderness. There is no guarding or rebound.  Musculoskeletal:     Cervical back: Neck supple.     Right lower leg: Edema present.  Left lower leg: Edema present.     Comments: Edema bilateral lower, erythema bilateral, left greater than right  Skin:    General: Skin is warm and dry.     Findings: No rash.  Neurological:     Mental Status: He is alert.     Cranial Nerves: No cranial nerve deficit (no facial droop, extraocular movements intact, no slurred speech).     Sensory: No sensory deficit.     Motor: No abnormal muscle tone or seizure activity.     Coordination: Coordination normal.     ED Results / Procedures / Treatments   Labs (all labs ordered are listed, but only abnormal results are displayed) Labs Reviewed  BASIC METABOLIC PANEL - Abnormal; Notable for the following components:      Result Value   Chloride 96 (*)    Glucose, Bld 198 (*)    All other components within normal limits  SARS CORONAVIRUS 2 BY RT PCR (HOSPITAL ORDER, [facility])  CBC WITH DIFFERENTIAL/PLATELET  BRAIN NATRIURETIC PEPTIDE    EKG None  Radiology DG Chest Portable 1 View  Result Date: 01/17/2020 CLINICAL DATA:  Cough, recent pneumonia, persistent cough, congestion and shortness of breath EXAM: PORTABLE CHEST 1 VIEW COMPARISON:  Portable exam 0911 hours compared to 08/26/2018 FINDINGS: Upper normal heart size. Mediastinal contours and pulmonary vascularity normal. Minimal RIGHT basilar atelectasis. Lungs otherwise clear. No acute infiltrate, pleural effusion or pneumothorax. IMPRESSION: Minimal RIGHT basilar atelectasis. Electronically Signed   By: Lavonia Dana M.D.   On: 01/17/2020 09:22   VAS [place] LOWER EXTREMITY VENOUS (DVT) (ONLY [facility] & [facility])  Result Date: 01/17/2020  Lower Venous DVTStudy Indications:  Swelling, and Edema.  Limitations: Body habitus and poor ultrasound/tissue interface. Comparison Study: 08-15-2019 LEV Bilat Performing Technologist: Darlin Coco  Examination Guidelines: A complete evaluation includes B-mode imaging, spectral Doppler, color Doppler, and power Doppler as needed of all accessible portions of each vessel. Bilateral testing is considered an integral part of a complete examination. Limited examinations for reoccurring indications may be performed as noted. The reflux portion of the exam is performed with the patient in reverse Trendelenburg.  +---------+---------------+---------+-----------+----------+--------------+ RIGHT    CompressibilityPhasicitySpontaneityPropertiesThrombus Aging +---------+---------------+---------+-----------+----------+--------------+ CFV      Full           Yes      Yes                                 +---------+---------------+---------+-----------+----------+--------------+ SFJ      Full           Yes      Yes                                 +---------+---------------+---------+-----------+----------+--------------+ FV Prox  Full           Yes      Yes                                 +---------+---------------+---------+-----------+----------+--------------+ FV Mid                  Yes      Yes                                 +---------+---------------+---------+-----------+----------+--------------+  FV Distal               Yes      Yes                                 +---------+---------------+---------+-----------+----------+--------------+ PFV                                                   Not visualized +---------+---------------+---------+-----------+----------+--------------+ POP                     Yes      Yes                                 +---------+---------------+---------+-----------+----------+--------------+ PTV      Full           Yes      Yes                                  +---------+---------------+---------+-----------+----------+--------------+ PERO                                                  Not visualized +---------+---------------+---------+-----------+----------+--------------+   +---------+---------------+---------+-----------+----------+--------------+ LEFT     CompressibilityPhasicitySpontaneityPropertiesThrombus Aging +---------+---------------+---------+-----------+----------+--------------+ CFV      Full           Yes      Yes                                 +---------+---------------+---------+-----------+----------+--------------+ SFJ      Full           Yes      Yes                                 +---------+---------------+---------+-----------+----------+--------------+ FV Prox  Full           Yes      Yes                                 +---------+---------------+---------+-----------+----------+--------------+ FV Mid                  Yes      Yes                                 +---------+---------------+---------+-----------+----------+--------------+ FV Distal               Yes      Yes                                 +---------+---------------+---------+-----------+----------+--------------+ PFV  Not visualized +---------+---------------+---------+-----------+----------+--------------+ POP                     Yes      Yes                                 +---------+---------------+---------+-----------+----------+--------------+ PTV      Full           Yes      Yes                                 +---------+---------------+---------+-----------+----------+--------------+ PERO                                                  Not visualized +---------+---------------+---------+-----------+----------+--------------+     Summary: RIGHT: - There is no evidence of deep vein thrombosis in the lower extremity. However, portions of this  examination were limited- see technologist comments above.  - No cystic structure found in the popliteal fossa.  LEFT: - There is no evidence of deep vein thrombosis in the lower extremity. However, portions of this examination were limited- see technologist comments above.  - No cystic structure found in the popliteal fossa.  *See table(s) above for measurements and observations.    Preliminary     Procedures Procedures (including critical care time)  Medications Ordered in ED Medications - No data to display  ED Course  I have reviewed the triage vital signs and the nursing notes.  Pertinent labs & imaging results that were available during my care of the patient were reviewed by me and considered in my medical decision making (see chart for details).  Clinical Course as of Jan 16 1109  Thu Jan 17, 2020  1037 Prelim DVT study.  Negative for clot   [JK]  1056 Labs reviewed.  Covid test is negative.  CBC metabolic panel and BNP are normal   [JK]  1057 Chest x-ray without pneumonia.   [JK]    Clinical Course User Index [JK] Dorie Rank, MD   MDM Rules/Calculators/A&P                          Patient presented to ED for evaluation of persistent cough for the last couple of months.  Patient states he has been coughing up phlegm for an extended period of time.  Cough observed in the ED.  Primarily appears nonproductive but patient then does spit in the emesis bag after the persistent coughing.  X-ray does not show pneumonia.  Covid test is negative.  Patient was concerned about possible fluid overload but chest x-ray does not show signs of pulmonary edema.  He likely does have a component of peripheral edema.  DVT study is negative.  Doubt PE.  Suspect the patient does have a component of peripheral edema.  Will have him increase his lasix to twice daily for the next few days.  Follow up with his pcp to discuss further evaluation of this persistent cough, such as pulmonary consult. Final  Clinical Impression(s) / ED Diagnoses Final diagnoses:  Cough  Morbid obesity ([place])    Rx / DC Orders ED Discharge Orders  Ordered    benzonatate (TESSALON) 100 MG capsule  Every 8 hours     Discontinue  Reprint     01/17/20 1108           Dorie Rank, MD 01/17/20 1113

## 2020-01-17 NOTE — Progress Notes (Signed)
Lower extremity venous bilateral study completed.   See Cv Proc for preliminary results.   Ritika Hellickson  

## 2020-01-17 NOTE — ED Notes (Signed)
[facility] called for transportation  

## 2020-02-18 ENCOUNTER — Telehealth: Payer: Self-pay | Admitting: Cardiology

## 2020-02-18 NOTE — Telephone Encounter (Signed)
Spoke with patient about scheduling follow up with [facility] from recall list and patient stated they no longer needed to be seen here because they are now under [facility]

## 2020-03-03 ENCOUNTER — Ambulatory Visit: Payer: Medicare Other | Admitting: Nurse Practitioner

## 2020-05-21 DIAGNOSIS — L0390 Cellulitis, unspecified: Secondary | ICD-10-CM

## 2020-05-21 HISTORY — DX: Cellulitis, unspecified: L03.90

## 2020-06-03 ENCOUNTER — Emergency Department (HOSPITAL_BASED_OUTPATIENT_CLINIC_OR_DEPARTMENT_OTHER): Payer: Medicare (Managed Care)

## 2020-06-03 ENCOUNTER — Other Ambulatory Visit: Payer: Self-pay

## 2020-06-03 ENCOUNTER — Emergency Department (HOSPITAL_COMMUNITY): Payer: Medicare (Managed Care)

## 2020-06-03 ENCOUNTER — Inpatient Hospital Stay (HOSPITAL_COMMUNITY)
Admission: EM | Admit: 2020-06-03 | Discharge: 2020-06-07 | DRG: 637 | Disposition: A | Discharge status: Home / Self Care | Payer: Medicare (Managed Care) | Attending: Internal Medicine | Admitting: Internal Medicine

## 2020-06-03 DIAGNOSIS — J4540 Moderate persistent asthma, uncomplicated: Secondary | ICD-10-CM | POA: Diagnosis present

## 2020-06-03 DIAGNOSIS — M79641 Pain in right hand: Secondary | ICD-10-CM | POA: Diagnosis present

## 2020-06-03 DIAGNOSIS — I878 Other specified disorders of veins: Secondary | ICD-10-CM | POA: Diagnosis present

## 2020-06-03 DIAGNOSIS — M19042 Primary osteoarthritis, left hand: Secondary | ICD-10-CM | POA: Diagnosis present

## 2020-06-03 DIAGNOSIS — I110 Hypertensive heart disease with heart failure: Secondary | ICD-10-CM | POA: Diagnosis present

## 2020-06-03 DIAGNOSIS — L0390 Cellulitis, unspecified: Secondary | ICD-10-CM | POA: Diagnosis present

## 2020-06-03 DIAGNOSIS — R600 Localized edema: Secondary | ICD-10-CM

## 2020-06-03 DIAGNOSIS — Z79899 Other long term (current) drug therapy: Secondary | ICD-10-CM

## 2020-06-03 DIAGNOSIS — I5033 Acute on chronic diastolic (congestive) heart failure: Secondary | ICD-10-CM | POA: Diagnosis present

## 2020-06-03 DIAGNOSIS — R059 Cough, unspecified: Secondary | ICD-10-CM | POA: Diagnosis not present

## 2020-06-03 DIAGNOSIS — M109 Gout, unspecified: Secondary | ICD-10-CM | POA: Diagnosis present

## 2020-06-03 DIAGNOSIS — Z20822 Contact with and (suspected) exposure to covid-19: Secondary | ICD-10-CM | POA: Diagnosis present

## 2020-06-03 DIAGNOSIS — Z87891 Personal history of nicotine dependence: Secondary | ICD-10-CM

## 2020-06-03 DIAGNOSIS — E11628 Type 2 diabetes mellitus with other skin complications: Secondary | ICD-10-CM | POA: Diagnosis not present

## 2020-06-03 DIAGNOSIS — M170 Bilateral primary osteoarthritis of knee: Secondary | ICD-10-CM | POA: Diagnosis present

## 2020-06-03 DIAGNOSIS — Z9884 Bariatric surgery status: Secondary | ICD-10-CM

## 2020-06-03 DIAGNOSIS — Z8249 Family history of ischemic heart disease and other diseases of the circulatory system: Secondary | ICD-10-CM

## 2020-06-03 DIAGNOSIS — M7989 Other specified soft tissue disorders: Secondary | ICD-10-CM | POA: Diagnosis not present

## 2020-06-03 DIAGNOSIS — M19041 Primary osteoarthritis, right hand: Secondary | ICD-10-CM | POA: Diagnosis present

## 2020-06-03 DIAGNOSIS — L03116 Cellulitis of left lower limb: Secondary | ICD-10-CM

## 2020-06-03 DIAGNOSIS — R0982 Postnasal drip: Secondary | ICD-10-CM | POA: Diagnosis present

## 2020-06-03 DIAGNOSIS — Z79890 Hormone replacement therapy (postmenopausal): Secondary | ICD-10-CM

## 2020-06-03 DIAGNOSIS — M79642 Pain in left hand: Secondary | ICD-10-CM | POA: Diagnosis present

## 2020-06-03 DIAGNOSIS — Z993 Dependence on wheelchair: Secondary | ICD-10-CM

## 2020-06-03 DIAGNOSIS — Z7984 Long term (current) use of oral hypoglycemic drugs: Secondary | ICD-10-CM

## 2020-06-03 DIAGNOSIS — Z86711 Personal history of pulmonary embolism: Secondary | ICD-10-CM

## 2020-06-03 DIAGNOSIS — E785 Hyperlipidemia, unspecified: Secondary | ICD-10-CM | POA: Diagnosis present

## 2020-06-03 DIAGNOSIS — Z888 Allergy status to other drugs, medicaments and biological substances status: Secondary | ICD-10-CM

## 2020-06-03 DIAGNOSIS — G8929 Other chronic pain: Secondary | ICD-10-CM | POA: Diagnosis present

## 2020-06-03 LAB — CBC WITH DIFFERENTIAL/PLATELET
Abs Immature Granulocytes: 0.1 10*3/uL — ABNORMAL HIGH (ref 0.00–0.07)
Basophils Absolute: 0 10*3/uL (ref 0.0–0.1)
Basophils Relative: 0 %
Eosinophils Absolute: 0.4 10*3/uL (ref 0.0–0.5)
Eosinophils Relative: 4 %
HCT: 41.5 % (ref 39.0–52.0)
Hemoglobin: 13.3 g/dL (ref 13.0–17.0)
Immature Granulocytes: 1 %
Lymphocytes Relative: 25 %
Lymphs Abs: 2.4 10*3/uL (ref 0.7–4.0)
MCH: 32.6 pg (ref 26.0–34.0)
MCHC: 32 g/dL (ref 30.0–36.0)
MCV: 101.7 fL — ABNORMAL HIGH (ref 80.0–100.0)
Monocytes Absolute: 0.6 10*3/uL (ref 0.1–1.0)
Monocytes Relative: 6 %
Neutro Abs: 6.1 10*3/uL (ref 1.7–7.7)
Neutrophils Relative %: 64 %
Platelets: 199 10*3/uL (ref 150–400)
RBC: 4.08 MIL/uL — ABNORMAL LOW (ref 4.22–5.81)
RDW: 13.2 % (ref 11.5–15.5)
WBC: 9.5 10*3/uL (ref 4.0–10.5)
nRBC: 0 % (ref 0.0–0.2)

## 2020-06-03 LAB — COMPREHENSIVE METABOLIC PANEL
ALT: 33 U/L (ref 0–44)
AST: 41 U/L (ref 15–41)
Albumin: 2.5 g/dL — ABNORMAL LOW (ref 3.5–5.0)
Alkaline Phosphatase: 124 U/L (ref 38–126)
Anion gap: 17 — ABNORMAL HIGH (ref 5–15)
BUN: 18 mg/dL (ref 6–20)
CO2: 23 mmol/L (ref 22–32)
Calcium: 9.2 mg/dL (ref 8.9–10.3)
Chloride: 93 mmol/L — ABNORMAL LOW (ref 98–111)
Creatinine, Ser: 1.11 mg/dL (ref 0.61–1.24)
GFR, Estimated: >60 mL/min (ref >60)
Glucose, Bld: 334 mg/dL — ABNORMAL HIGH (ref 70–99)
Potassium: 4 mmol/L (ref 3.5–5.1)
Sodium: 133 mmol/L — ABNORMAL LOW (ref 135–145)
Total Bilirubin: 0.8 mg/dL (ref 0.3–1.2)
Total Protein: 7.3 g/dL (ref 6.5–8.1)

## 2020-06-03 LAB — URINALYSIS, ROUTINE W REFLEX MICROSCOPIC
Bilirubin Urine: NEGATIVE
Glucose, UA: NEGATIVE mg/dL
Hgb urine dipstick: NEGATIVE
Ketones, ur: NEGATIVE mg/dL
Leukocytes,Ua: NEGATIVE
Nitrite: NEGATIVE
Protein, ur: NEGATIVE mg/dL
Specific Gravity, Urine: 1.005 (ref 1.005–1.030)
pH: 6 (ref 5.0–8.0)

## 2020-06-03 LAB — RESP PANEL BY RT-PCR (FLU A&B, COVID) ARPGX2
Influenza A by PCR: NEGATIVE
Influenza B by PCR: NEGATIVE
SARS Coronavirus 2 by RT PCR: NEGATIVE

## 2020-06-03 LAB — HEMOGLOBIN A1C
Hgb A1c MFr Bld: 7.4 % — ABNORMAL HIGH (ref 4.8–5.6)
Mean Plasma Glucose: 165.68 mg/dL

## 2020-06-03 LAB — CBG MONITORING, ED: Glucose-Capillary: 235 mg/dL — ABNORMAL HIGH (ref 70–99)

## 2020-06-03 LAB — BRAIN NATRIURETIC PEPTIDE: B Natriuretic Peptide: 109.5 pg/mL — ABNORMAL HIGH (ref 0.0–100.0)

## 2020-06-03 MED ORDER — ROSUVASTATIN CALCIUM 20 MG PO TABS
20.0000 mg | ORAL_TABLET | Freq: Every day | ORAL | Status: DC
Start: 2020-06-03 — End: 2020-06-07
  Administered 2020-06-04 – 2020-06-06 (×3): 20 mg via ORAL
  Filled 2020-06-03 (×3): qty 1

## 2020-06-03 MED ORDER — COLCHICINE 0.6 MG PO TABS
0.6000 mg | ORAL_TABLET | Freq: Every day | ORAL | Status: DC
Start: 2020-06-04 — End: 2020-06-07
  Administered 2020-06-04 – 2020-06-06 (×3): 0.6 mg via ORAL
  Filled 2020-06-03 (×3): qty 1

## 2020-06-03 MED ORDER — SODIUM CHLORIDE 0.9% FLUSH
3.0000 mL | Freq: Two times a day (BID) | INTRAVENOUS | Status: DC
  Administered 2020-06-04 – 2020-06-06 (×4): 3 mL via INTRAVENOUS

## 2020-06-03 MED ORDER — FUROSEMIDE 10 MG/ML IJ SOLN
40.0000 mg | Freq: Once | INTRAMUSCULAR | Status: AC
  Administered 2020-06-03: 40 mg via INTRAVENOUS
  Filled 2020-06-03: qty 4

## 2020-06-03 MED ORDER — INSULIN ASPART 100 UNIT/ML ~~LOC~~ SOLN
0.0000 [IU] | Freq: Three times a day (TID) | SUBCUTANEOUS | Status: DC
  Administered 2020-06-04: 7 [IU] via SUBCUTANEOUS
  Administered 2020-06-04: 4 [IU] via SUBCUTANEOUS
  Administered 2020-06-04: 11 [IU] via SUBCUTANEOUS
  Administered 2020-06-05 (×2): 7 [IU] via SUBCUTANEOUS
  Administered 2020-06-05: 4 [IU] via SUBCUTANEOUS
  Administered 2020-06-06 (×2): 11 [IU] via SUBCUTANEOUS
  Administered 2020-06-06: 4 [IU] via SUBCUTANEOUS

## 2020-06-03 MED ORDER — ALBUTEROL SULFATE HFA 108 (90 BASE) MCG/ACT IN AERS
1.0000 | INHALATION_SPRAY | Freq: Four times a day (QID) | RESPIRATORY_TRACT | Status: DC | PRN
  Filled 2020-06-03: qty 6.7

## 2020-06-03 MED ORDER — LEVOTHYROXINE SODIUM 100 MCG PO TABS
100.0000 ug | ORAL_TABLET | Freq: Every day | ORAL | Status: DC
  Administered 2020-06-04 – 2020-06-06 (×3): 100 ug via ORAL
  Filled 2020-06-03 (×3): qty 1

## 2020-06-03 MED ORDER — ALLOPURINOL 300 MG PO TABS
300.0000 mg | ORAL_TABLET | Freq: Two times a day (BID) | ORAL | Status: DC
  Administered 2020-06-03 – 2020-06-06 (×6): 300 mg via ORAL
  Filled 2020-06-03 (×7): qty 1

## 2020-06-03 MED ORDER — ENOXAPARIN SODIUM 40 MG/0.4ML ~~LOC~~ SOLN
40.0000 mg | SUBCUTANEOUS | Status: DC
  Administered 2020-06-03: 40 mg via SUBCUTANEOUS
  Filled 2020-06-03: qty 0.4

## 2020-06-03 MED ORDER — CEPHALEXIN 500 MG PO CAPS
500.0000 mg | ORAL_CAPSULE | Freq: Four times a day (QID) | ORAL | Status: DC
  Administered 2020-06-03 – 2020-06-04 (×3): 500 mg via ORAL
  Filled 2020-06-03: qty 2
  Filled 2020-06-03: qty 1
  Filled 2020-06-03: qty 2

## 2020-06-03 MED ORDER — LISINOPRIL 5 MG PO TABS
2.5000 mg | ORAL_TABLET | Freq: Every day | ORAL | Status: DC
  Administered 2020-06-04 – 2020-06-06 (×3): 2.5 mg via ORAL
  Filled 2020-06-03 (×3): qty 1

## 2020-06-03 NOTE — ED Notes (Signed)
Assisted patient to a bariatric bed,

## 2020-06-03 NOTE — ED Triage Notes (Signed)
BIB [facility] after pt called to c/o increase BIL LE edema  4 days. Per EMS, pt has also c/o of increase weakness over the last four days as well as ongoing cellulitis in BIL LE. Pt has has hx of COPD, DM, CHF, Asthma.

## 2020-06-03 NOTE — ED Notes (Signed)
Portable Equipment (Ordered Bariatric Bed)paged @ 1222-per William Hamburger, RN paged by Levada Dy

## 2020-06-03 NOTE — Progress Notes (Signed)
Lower extremity venous bilateral study completed.   Preliminary Results:  Limited study due to patient body habitus. Full compression at RT common femoral vein/saphenofemoral junction and LT common femoral vein/saphenofemoral junction. RT femoral vein and popliteal vein appear patent by color. LT femoral vein and popliteal vein appear patent by color. RT and LT PTV and peroneal veins not well visualized.   Preliminary results discussed with provider in ED.   Darlin Coco, RDMS

## 2020-06-03 NOTE — ED Notes (Signed)
Dinner Tray Ordered @ [phone].

## 2020-06-03 NOTE — ED Provider Notes (Signed)
[facility] EMERGENCY DEPARTMENT Provider Note   CSN: [id number] Arrival date & time: 06/03/20  1207     History Chief Complaint  Patient presents with  . Leg Swelling    BLADIMIR AUMAN is a 59 y.o. male.  Patient followed by pace organization to assist with his chronic medical conditions, bariatric patient, history of high blood pressure, pulmonary embolism, diabetes, congestive heart failure on Lasix 40 mg daily took extra 40 mg this morning presents with general weakness and worsening leg edema.  Patient said he is on medicine for left leg cellulitis unsure which name recently has had worsening leg edema since the weekend.  Patient denies any significant shortness of breath compared to his baseline and has intermittent nonproductive cough.  Patient unsure his medications.  Patient wheelchair-bound.        Past Medical History:  Diagnosis Date  . Arthritis    "knees, hands" (04/19/2017)  . Childhood asthma   . Diabetes type 2, uncontrolled ([place])   . Gout    "have had it in both feet, both hands, right shoulder, back" (04/19/2017)  . Hyperlipidemia   . Hypertension   . Morbidly obese ([place])   . Pulmonary embolism ([place]) 2017?    Patient Active Problem List   Diagnosis Date Noted  . Polyarthritis 08/17/2019  . Physical deconditioning 08/16/2019  . Pressure injury of skin 08/16/2019  . Gout attack 08/15/2019  . Moderate persistent asthma with exacerbation   . Acute respiratory failure with hypoxia ([place]) 08/26/2018  . Acute diastolic CHF (congestive heart failure) ([place]) 08/26/2018  . Left knee pain 03/31/2018  . Knee pain   . Acute gout 04/18/2017  . Pulmonary embolus ([place]) 08/05/2016  . Hyperglycemia, drug-induced 07/08/2016  . Impaired glucose tolerance 07/08/2016  . Leukocytosis 07/06/2016  . Unable to walk 07/06/2016  . Acute gouty arthritis 07/06/2016  . Abnormality of gait 02/18/2016  . Cellulitis of left lower extremity 01/27/2016  .  Type 2 diabetes mellitus ([place]) 12/25/2013  . Drug-induced gout 12/25/2013  . Hyperlipidemia 08/01/2013  . Morbid obesity ([place])   . Hypertension   . Gout   . Colon cancer screening 07/27/2011  . Benign neoplasm of colon 07/27/2011    Past Surgical History:  Procedure Laterality Date  . COLONOSCOPY W/ BIOPSIES AND POLYPECTOMY  07/2011   Archie Endo 07/27/2011       Family History  Problem Relation Age of Onset  . Colon cancer Maternal Grandfather 68  . Heart attack Father   . Stomach cancer Maternal Aunt 80    Social History   Tobacco Use  . Smoking status: Former Smoker    Types: Cigarettes    Quit date: 02/09/1982    Years since quitting: 38.3  . Smokeless tobacco: Never Used  Vaping Use  . Vaping Use: Never used  Substance Use Topics  . Alcohol use: No  . Drug use: Not Currently    Comment: 04/19/2017 "nothing since I was 21"    Home Medications Prior to Admission medications   Medication Sig Start Date End Date Taking? Authorizing Provider  albuterol (VENTOLIN HFA) 108 (90 Base) MCG/ACT inhaler Inhale 1-2 puffs into the lungs every 6 (six) hours as needed for wheezing or shortness of breath.    [provider]  allopurinol (ZYLOPRIM) 300 MG tablet Take 1 tablet (300 mg total) by mouth 2 (two) times daily. 08/27/19   [name], NP  ascorbic acid (VITAMIN C) 500 MG tablet Take 500 mg by mouth  daily.    [provider]  atorvastatin (LIPITOR) 10 MG tablet Take 1 tablet (10 mg total) by mouth daily at 6 PM. 08/29/18   Eugenie Filler, MD  benzonatate (TESSALON) 100 MG capsule Take 1 capsule (100 mg total) by mouth every 8 (eight) hours. 01/17/20   Dorie Rank, MD  colchicine 0.6 MG tablet Take 1 tablet (0.6 mg total) by mouth daily. 04/04/18   Shelly Coss, MD  diclofenac Sodium (VOLTAREN) 1 % GEL Apply 2 g topically 4 (four) times daily. 07/09/19   [name], NP  furosemide (LASIX) 40 MG tablet Take one tablet by mouth once daily 07/25/19    [name], NP  ibuprofen (ADVIL) 200 MG tablet Take 400 mg by mouth every 6 (six) hours as needed.    [provider]  lisinopril (ZESTRIL) 2.5 MG tablet TAKE 1 TABLET BY MOUTH EVERY DAY 04/30/19   [name], NP  Multiple Vitamin (MULTIVITAMIN WITH MINERALS) TABS tablet Take 1 tablet by mouth daily. One a Day Men's    [provider]  nystatin (MYCOSTATIN/NYSTOP) powder Apply 1 application topically 3 (three) times daily. 08/24/19   [name], NP  UNABLE TO FIND bariatric drop arm commode Dx) R54, E66.01, I50.9, M19.9 07/09/19   [name], NP  UNABLE TO FIND Bariatric tub transfer bench Dx) R54, E66.01, I50.9, M19.9 07/09/19   [name], NP  UNABLE TO FIND Bariatric lift chair. Dx) R54, E66.01, I50.9, M19.9 07/09/19   [name], NP  UNABLE TO FIND Heavy duty Kasandra Knudsen Dx) R54, E66.01, I50.9, M19.9 07/09/19   [name], NP    Allergies    Uloric [febuxostat]  Review of Systems   Review of Systems  Constitutional: Positive for fatigue. Negative for chills and fever.  HENT: Negative for congestion.   Eyes: Negative for visual disturbance.  Respiratory: Positive for shortness of breath.   Cardiovascular: Positive for leg swelling. Negative for chest pain.  Gastrointestinal: Negative for abdominal pain and vomiting.  Genitourinary: Negative for dysuria and flank pain.  Musculoskeletal: Negative for back pain, neck pain and neck stiffness.  Skin: Positive for rash.  Neurological: Positive for weakness. Negative for light-headedness and headaches.    Physical Exam Updated Vital Signs BP (!) 164/48 (BP Location: Right Arm)   Pulse (!) 113   Temp 98.2 F (36.8 C)   Resp 18   SpO2 98%   Physical Exam Vitals and nursing note reviewed.  Constitutional:      Appearance: He is well-developed and well-nourished.  HENT:     Head: Normocephalic and atraumatic.  Eyes:     General:        Right eye: No discharge.         Left eye: No discharge.     Conjunctiva/sclera: Conjunctivae normal.  Neck:     Trachea: No tracheal deviation.  Cardiovascular:     Rate and Rhythm: Normal rate and regular rhythm.  Pulmonary:     Effort: Pulmonary effort is normal. No respiratory distress.     Breath sounds: No wheezing.  Abdominal:     General: There is no distension.     Palpations: Abdomen is soft.     Tenderness: There is no abdominal tenderness. There is no guarding.  Musculoskeletal:        General: Swelling and tenderness present. No edema.     Cervical back: Normal range of motion and neck supple.  Skin:  General: Skin is warm.     Findings: Erythema and rash present.     Comments: Patient has erythema bilateral lower extremities most significant left lower extremity with warmth acute on chronic skin lesions, few superficial blistering regions.  3+ edema lower extremities especially left lower.  Neurological:     General: No focal deficit present.     Mental Status: He is alert and oriented to person, place, and time.  Psychiatric:        Mood and Affect: Mood and affect and mood normal.     ED Results / Procedures / Treatments   Labs (all labs ordered are listed, but only abnormal results are displayed) Labs Reviewed  COMPREHENSIVE METABOLIC PANEL - Abnormal; Notable for the following components:      Result Value   Sodium 133 (*)    Chloride 93 (*)    Glucose, Bld 334 (*)    Albumin 2.5 (*)    Anion gap 17 (*)    All other components within normal limits  CBC WITH DIFFERENTIAL/PLATELET - Abnormal; Notable for the following components:   RBC 4.08 (*)    MCV 101.7 (*)    Abs Immature Granulocytes 0.10 (*)    All other components within normal limits  URINALYSIS, ROUTINE W REFLEX MICROSCOPIC - Abnormal; Notable for the following components:   Color, Urine STRAW (*)    All other components within normal limits  RESP PANEL BY RT-PCR (FLU A&B, COVID) ARPGX2  BRAIN NATRIURETIC PEPTIDE     EKG None  Radiology DG Chest 1 View  Result Date: 06/03/2020 CLINICAL DATA:  Cough and leg swelling for the past 2 days. EXAM: CHEST  1 VIEW COMPARISON:  Chest x-ray dated January 17, 2020. FINDINGS: The heart size and mediastinal contours are within normal limits. Both lungs are clear. The visualized skeletal structures are unremarkable. IMPRESSION: No active disease. Electronically Signed   By: Titus Dubin M.D.   On: 06/03/2020 13:54    Procedures Procedures (including critical care time)  Medications Ordered in ED Medications  furosemide (LASIX) injection 40 mg (has no administration in time range)    ED Course  I have reviewed the triage vital signs and the nursing notes.  Pertinent labs & imaging results that were available during my care of the patient were reviewed by me and considered in my medical decision making (see chart for details).    MDM Rules/Calculators/A&P                          Patient with chronic medical conditions, morbid obesity, bed and wheelchair bound presents with worsening left lower extremity edema and currently being treated per his report for cellulitis outpatient. Other differentials include worsening clot burden, ultrasound ordered for further delineation.  Difficult exam due to morbid obesity, no respiratory distress, normal oxygenation.  Chest x-ray and BNP look [place] for signs of heart failure exacerbation. Patient took oral 40 mg extra Lasix at home, had to episode of urine output while waiting in the ER.  Plan to check general blood work and likely plan for IV Lasix, IV of antibiotics admission.  On reassessment patient having ultrasound of extremities performed.  IV Lasix ordered.  Blood work reviewed sodium 133, glucose 334.  Normal hemoglobin, normal white blood cell count.  Urinalysis reviewed unremarkable. With significant edema paged internal medicine teaching service as patient is a pace patient discussed admission for IV Lasix and  further evaluation with consideration  for IV antibiotics. Patient care signed out to continue to monitor until admission.   Final Clinical Impression(s) / ED Diagnoses Final diagnoses:  Bilateral leg edema  Cellulitis of left lower extremity    Rx / DC Orders ED Discharge Orders    None       Elnora Morrison, MD 06/03/20 1545

## 2020-06-03 NOTE — ED Notes (Signed)
Patient transported to X-ray 

## 2020-06-03 NOTE — H&P (Signed)
Date: 06/03/2020               Patient Name:  Ronnie Hernandez MRN: [id number]  DOB: May 26, 1961 Age / Sex: 59 y.o., male   PCP: [facility] [place]         Medical Service: Internal Medicine Teaching Service         Attending Physician: Dr. Velna Ochs, MD    First Contact: Dr. Demaris Callander Pager: [phone]  Second Contact: Dr. Charleen Kirks Pager: [phone]       After Hours (After 5p/  First Contact Pager: [phone]  weekends / holidays): Second Contact Pager: [phone]   Chief Complaint: Leg swelling  History of Present Illness: DURELL LOFASO is a 59 yo M with PMHx of HFpEF, Gout, HTN, HLD, Asthma, PE (2018), T2DM, cellulitis, morbid obesity & physical deconditioning presented to [facility] with leg swelling and redness *7 days.  Patient states he was in his usual state of health until 1 week ago when he started to have redness in his legs with progressive swelling and pain. He denies fever, chills at home but c/o productive cough with ~100 ml which is chronic, progressively worsening. Also states was able to walk from home to parking lot a year ago but due to his  gradually worsening SOB not able to walk from his living hall to bedroom, wheelchair bound now. Notes have regular normal bowel movement but had 1 episode of diarrhea last evening.  Denies headaches, chest congestion, abdominal pain or chest pain.    ED course: On presentation BP 164/48, Pulse 113, Temp 98.2, Resp 18, SpO2 98%. Hb 13.3 and WBC 9.5, BNP 109.5, blood glucose 334. Received 1 dose of 40 mg IV Lasix. Meds:  Current Outpatient Medications  Medication Instructions  . albuterol (VENTOLIN HFA) 108 (90 Base) MCG/ACT inhaler 1-2 puffs, Inhalation, Every 6 hours PRN  . allopurinol (ZYLOPRIM) 300 mg, Oral, 2 times daily  . ascorbic acid (VITAMIN C) 500 mg, Oral, Daily  . atorvastatin (LIPITOR) 10 mg, Oral, Daily-1800  . benzonatate (TESSALON) 100 mg, Oral, Every 8 hours  . colchicine 0.6 mg,  Oral, Daily  . diclofenac Sodium (VOLTAREN) 2 g, Topical, 4 times daily  . doxycycline (DORYX) 100 mg, Oral, 2 times daily  . fluticasone (FLONASE) 50 MCG/ACT nasal spray 1 spray, Each Nare, Daily PRN  . furosemide (LASIX) 40 MG tablet Take one tablet by mouth once daily  . ibuprofen (ADVIL) 400 mg, Oral, Every 6 hours PRN  . levothyroxine (SYNTHROID) 100 mcg, Oral, Daily before breakfast  . lisinopril (ZESTRIL) 2.5 MG tablet TAKE 1 TABLET BY MOUTH EVERY DAY  . metFORMIN (GLUCOPHAGE) 500 mg, Oral, 2 times daily with meals  . Multiple Vitamin (MULTIVITAMIN WITH MINERALS) TABS tablet 1 tablet, Oral, Daily, One a Day Men's   . nystatin (MYCOSTATIN/NYSTOP) powder 1 application, Topical, 3 times daily  . rosuvastatin (CRESTOR) 20 mg, Oral, Daily  . UNABLE TO FIND bariatric drop arm commode Dx) R54, E66.01, I50.9, M19.9  . UNABLE TO FIND Bariatric tub transfer bench Dx) R54, E66.01, I50.9, M19.9  . UNABLE TO FIND Bariatric lift chair. Dx) R54, E66.01, I50.9, M19.9  . UNABLE TO FIND Heavy duty Cane Dx) R54, E66.01, I50.9, M19.9  . vitamin D3 2,000 Units, Oral, Daily    Allergies: Allergies as of 06/03/2020 - Review Complete 06/03/2020  Allergen Reaction Noted  . Uloric [febuxostat] Other (See Comments) 08/05/2016   Past Medical History:  Diagnosis Date  .  Arthritis    "knees, hands" (04/19/2017)  . Childhood asthma   . Diabetes type 2, uncontrolled ([place])   . Gout    "have had it in both feet, both hands, right shoulder, back" (04/19/2017)  . Hyperlipidemia   . Hypertension   . Morbidly obese ([place])   . Pulmonary embolism ([place]) 2017?    Family History:  Family History  Problem Relation Age of Onset  . Colon cancer Maternal Grandfather 56  . Heart attack Father   . Stomach cancer Maternal Aunt 80     Social History: Former smoker about 37 years ago, no drug or alcohol use.  Review of Systems: A complete ROS was negative except as per HPI.   Physical Exam: Blood pressure (!)  147/68, pulse 93, temperature 98.2 F (36.8 C), resp. rate 18, height 5\' 6"  (1.676 m), weight (!) 233.6 kg, SpO2 100 %. Physical Exam Constitutional:      Appearance: He is obese.  HENT:     Head: Normocephalic and atraumatic.     Mouth/Throat:     Mouth: Mucous membranes are moist.  Eyes:     Extraocular Movements: Extraocular movements intact.     Conjunctiva/sclera: Conjunctivae normal.     Pupils: Pupils are equal, round, and reactive to light.  Cardiovascular:     Comments: Distant Heart sounds due to body habitus Pulmonary:     Comments: Distant breath sounds due to body habitus, Faint wheezes in R lower lobe Abdominal:     General: Bowel sounds are normal. There is no distension.     Palpations: Abdomen is soft.     Tenderness: There is no abdominal tenderness. There is no guarding.  Musculoskeletal:     Left lower leg: Edema present.     Comments: 3+ non-pitting edema lower extremities especially left lower  Skin:    Comments:  Bilateral lower extremities erythema, most significant left lower extremity with warmth , few superficial blistering regions  Neurological:     Mental Status: He is alert and oriented to person, place, and time.  Psychiatric:        Mood and Affect: Mood normal.        Behavior: Behavior normal.        Thought Content: Thought content normal.        Judgment: Judgment normal.    EKG: N/A  CXR: personally reviewed my interpretation is no active disease.  Assessment & Plan by Problem: Active Problems:   Bilateral lower extremity edema  MIKLOS BIDINGER is a 59 yo M with PMHx of HFpEF, Gout, HTN, HLD, Asthma, PE (2018), T2DM, cellulitis, morbid obesity & physical deconditioning presented to [facility] with leg swelling and redness *7 days.  #Acute on chronic HFpEF  Presents with Bilateral lower extremity edema, gradually worsening dyspnea on exertion, acute on chronic productive cough most probably 2/2 to HFpEF. BNP 109.5, although may be falsely  low by morbid obesity. Given the left leg is more swollen than right, B/L venous duplex of the bilateral lower extremities was ordered and negative for DVT. Patient takes 40 mg PO lasix at home and did take 2 doses today without adequate response. Due to this, he is currently admitted for IV Lasix. Last echo was +1 year ago, so will repeat to evaluate for any changes.   - IV Lasix 40 mg once  -Strict I & O's - Daily weights - TTE ordered   # L leg Cellulitis Bilateral lower extremity erythema, although more prominent on  left lower leg along with edema and warmth, non-purulent. Can be possibly due to venous stasis but favors more toward cellulitis on left due to profound redness.  WBC 9.5, no systemic signs of infection like fever, chills.   - Start Keflex 500 mg Q6h    # Gout  Currently at baseline. Home medications are Allopurinol 300 mg BID , Colchicine 0.6 mg Qday.  -C/W Allopurinol 300 mg BID - Colchicine 0.6 mg  #Hypertension Home medication is Lisinopril 2.5 mg Q day  -C/W Lisinopril 2.5 mg  #Hyperlipidemia  Home medication Crestor 20 mg Qday  -C/w Crestor 20 mg  # T2DM Last HbA1c 6.4. Not on any home regimen.  -CBG monitoring - SSI     Dispo: Admit patient to Observation with expected length of stay less than 2 midnights.  Signed: Honor Junes, MD 06/03/2020, 7:08 PM  Pager: [phone] After 5pm on weekdays and 1pm on weekends: On Call pager: [phone]

## 2020-06-03 NOTE — ED Notes (Signed)
Pt repositioned in bed in most comfortable position with aid of staff. Pt provided snacks per request.

## 2020-06-04 ENCOUNTER — Observation Stay (HOSPITAL_BASED_OUTPATIENT_CLINIC_OR_DEPARTMENT_OTHER): Payer: Medicare (Managed Care)

## 2020-06-04 ENCOUNTER — Other Ambulatory Visit: Payer: Self-pay

## 2020-06-04 ENCOUNTER — Encounter (HOSPITAL_COMMUNITY): Payer: Self-pay | Admitting: Internal Medicine

## 2020-06-04 DIAGNOSIS — E119 Type 2 diabetes mellitus without complications: Secondary | ICD-10-CM | POA: Diagnosis not present

## 2020-06-04 DIAGNOSIS — E785 Hyperlipidemia, unspecified: Secondary | ICD-10-CM

## 2020-06-04 DIAGNOSIS — M109 Gout, unspecified: Secondary | ICD-10-CM

## 2020-06-04 DIAGNOSIS — I5033 Acute on chronic diastolic (congestive) heart failure: Secondary | ICD-10-CM

## 2020-06-04 DIAGNOSIS — L03116 Cellulitis of left lower limb: Secondary | ICD-10-CM | POA: Diagnosis not present

## 2020-06-04 DIAGNOSIS — I10 Essential (primary) hypertension: Secondary | ICD-10-CM

## 2020-06-04 LAB — GLUCOSE, CAPILLARY
Glucose-Capillary: 257 mg/dL — ABNORMAL HIGH (ref 70–99)
Glucose-Capillary: 186 mg/dL — ABNORMAL HIGH (ref 70–99)
Glucose-Capillary: 204 mg/dL — ABNORMAL HIGH (ref 70–99)
Glucose-Capillary: 210 mg/dL — ABNORMAL HIGH (ref 70–99)

## 2020-06-04 LAB — BASIC METABOLIC PANEL
Anion gap: 10 (ref 5–15)
BUN: 18 mg/dL (ref 6–20)
CO2: 29 mmol/L (ref 22–32)
Calcium: 8.7 mg/dL — ABNORMAL LOW (ref 8.9–10.3)
Chloride: 94 mmol/L — ABNORMAL LOW (ref 98–111)
Creatinine, Ser: 1.11 mg/dL (ref 0.61–1.24)
GFR, Estimated: >60 mL/min (ref >60)
Glucose, Bld: 269 mg/dL — ABNORMAL HIGH (ref 70–99)
Potassium: 4 mmol/L (ref 3.5–5.1)
Sodium: 133 mmol/L — ABNORMAL LOW (ref 135–145)

## 2020-06-04 LAB — CBC
HCT: 35.1 % — ABNORMAL LOW (ref 39.0–52.0)
Hemoglobin: 12.3 g/dL — ABNORMAL LOW (ref 13.0–17.0)
MCH: 33.9 pg (ref 26.0–34.0)
MCHC: 35 g/dL (ref 30.0–36.0)
MCV: 96.7 fL (ref 80.0–100.0)
Platelets: 219 10*3/uL (ref 150–400)
RBC: 3.63 MIL/uL — ABNORMAL LOW (ref 4.22–5.81)
RDW: 13.2 % (ref 11.5–15.5)
WBC: 8.3 10*3/uL (ref 4.0–10.5)
nRBC: 0 % (ref 0.0–0.2)

## 2020-06-04 LAB — ECHOCARDIOGRAM COMPLETE
Area-P 1/2: 3.6 cm2
Height: 66 in
S' Lateral: 3.4 cm
Weight: 8240 oz

## 2020-06-04 MED ORDER — ENOXAPARIN SODIUM 100 MG/ML ~~LOC~~ SOLN
100.0000 mg | SUBCUTANEOUS | Status: DC
  Administered 2020-06-04 – 2020-06-05 (×2): 100 mg via SUBCUTANEOUS
  Filled 2020-06-04 (×3): qty 1

## 2020-06-04 MED ORDER — ONDANSETRON HCL 4 MG PO TABS
4.0000 mg | ORAL_TABLET | Freq: Three times a day (TID) | ORAL | Status: DC | PRN
  Administered 2020-06-04: 4 mg via ORAL
  Filled 2020-06-04: qty 1

## 2020-06-04 MED ORDER — CEFAZOLIN SODIUM-DEXTROSE 2-4 GM/100ML-% IV SOLN
2.0000 g | Freq: Three times a day (TID) | INTRAVENOUS | Status: DC
  Administered 2020-06-04 – 2020-06-06 (×7): 2 g via INTRAVENOUS
  Filled 2020-06-04 (×9): qty 100

## 2020-06-04 MED ORDER — FUROSEMIDE 40 MG PO TABS
40.0000 mg | ORAL_TABLET | Freq: Every day | ORAL | Status: DC
Start: 2020-06-04 — End: 2020-06-07
  Administered 2020-06-04 – 2020-06-06 (×3): 40 mg via ORAL
  Filled 2020-06-04 (×3): qty 1

## 2020-06-04 MED ORDER — GUAIFENESIN 100 MG/5ML PO SOLN
5.0000 mL | ORAL | Status: DC | PRN
  Administered 2020-06-04 – 2020-06-05 (×2): 100 mg via ORAL
  Filled 2020-06-04 (×3): qty 5

## 2020-06-04 MED ORDER — METFORMIN HCL 500 MG PO TABS
500.0000 mg | ORAL_TABLET | Freq: Two times a day (BID) | ORAL | Status: DC
  Administered 2020-06-04 – 2020-06-06 (×5): 500 mg via ORAL
  Filled 2020-06-04 (×5): qty 1

## 2020-06-04 NOTE — Progress Notes (Signed)
Ronnie Hernandez is a 59 y.o. male patient admitted. Awake, alert - oriented  X 4 - no acute distress noted.  VSS - Blood pressure 104/66, pulse 86, temperature 98.2 F (36.8 C), resp. rate 18, height 5\' 6"  (1.676 m), weight (!) 233.6 kg, SpO2 98 %.    IV in place, occlusive dsg intact without redness.   Will cont to eval and treat per MD orders.  Vidal Schwalbe, RN 06/04/2020 4:20 AM

## 2020-06-04 NOTE — Progress Notes (Signed)
   Subjective: HD#1 Patient had an episode of nausea and received a dose of Zofran. Ronnie Hernandez evaluated at bedside this AM. He states he feels well other than nausea that he had overnight which he attributes due to his pain medication, currently resolved. He says his left leg is still tender. He says he is urinating more than usual. He continues to have productive cough but SOB has improved. Endorses chronic pain in hands.   Objective:  Vital signs in last 24 hours: Vitals:   06/03/20 2300 06/04/20 0030 06/04/20 0403 06/04/20 1028  BP: (!) 138/50 104/66 127/65 111/67  Pulse: 88 86  88  Resp: 16 18 18 20   Temp:    (!) 97.5 F (36.4 C)  TempSrc:   Oral Oral  SpO2: 99% 98% 98% 100%  Weight:      Height:       Physical exam:  General: Well developed, well nourished, obese male laying comfortably in bed @30  degress, NAD,  CV: Distant heart sounds due to body habitus Musculoskeletal: Swan neck deformities of bilateral fingers. 2+ non-pitting edema bilaterally lower extremities Skin: B/L lower extremity erythema, improved on R leg but significant on L leg, few superficial blisters Neuro: AAO* 3 Psych: Normal mood and affect  Assessment/Plan: Ronnie Hernandez is a 59 yo M with PMHx of HFpEF, Gout, HTN, HLD, Asthma, PE (2018), T2DM, cellulitis, morbid obesity & physical deconditioning presented to [facility] with leg swelling and redness *7 days and admitted for Left leg cellulitis.  Active Problems:   Bilateral lower extremity edema  # L leg Cellulitis Erythema on left lower leg is significant along with edema an d warmth, few blisters. Plan is to change oral antibiotics to IV. Erythema on R leg has improved significantly and looks like venous stasis with dermis changes.  WBC 8.3, no systemic signs of infection like fever, chills.   - D/C  Keflex 500 mg Q6h  -Start Cefazolin 2g every 8 hr   #Acute on chronic HFpEF- improving  Presents with improved SOB this AM, along with  decreased frequency & intensity of cough.Peripheral edema is improving as well. - Resume Home lasix dose 40 mg PO - Strict I & O's - Daily weights - F/U TTE  # T2DM Recent HbA1c is 7.4. Blood glucose 235 this AM.  -CBG monitoring - SSI  - Start Metformin 500 mg BID  # Gout  Currently at baseline. Home medications are Allopurinol 300 mg BID , Colchicine 0.6 mg Qday.  -C/W Allopurinol 300 mg BID - Colchicine 0.6 mg  #Hypertension Home medication is Lisinopril 2.5 mg Q day  -C/W Lisinopril 2.5 mg  #Hyperlipidemia  Home medication Crestor 20 mg Qday  -C/w Crestor 20 mg    Prior to Admission Living Arrangement: Home Anticipated Discharge Location: Home Barriers to Discharge: Ongoing medical management Dispo: Anticipated discharge in approximately 1-2 day(s).   Honor Junes, MD 06/04/2020, 11:33 AM Pager: [phone] After 5pm on weekdays and 1pm on weekends: On Call pager [phone]

## 2020-06-04 NOTE — Progress Notes (Signed)
  Echocardiogram 2D Echocardiogram has been performed.  Fidel Levy 06/04/2020, 12:21 PM

## 2020-06-05 DIAGNOSIS — M79642 Pain in left hand: Secondary | ICD-10-CM | POA: Diagnosis present

## 2020-06-05 DIAGNOSIS — Z86711 Personal history of pulmonary embolism: Secondary | ICD-10-CM | POA: Diagnosis not present

## 2020-06-05 DIAGNOSIS — E785 Hyperlipidemia, unspecified: Secondary | ICD-10-CM | POA: Diagnosis present

## 2020-06-05 DIAGNOSIS — R0982 Postnasal drip: Secondary | ICD-10-CM | POA: Diagnosis present

## 2020-06-05 DIAGNOSIS — M19042 Primary osteoarthritis, left hand: Secondary | ICD-10-CM | POA: Diagnosis present

## 2020-06-05 DIAGNOSIS — I5033 Acute on chronic diastolic (congestive) heart failure: Secondary | ICD-10-CM | POA: Diagnosis present

## 2020-06-05 DIAGNOSIS — M170 Bilateral primary osteoarthritis of knee: Secondary | ICD-10-CM | POA: Diagnosis present

## 2020-06-05 DIAGNOSIS — R059 Cough, unspecified: Secondary | ICD-10-CM | POA: Diagnosis present

## 2020-06-05 DIAGNOSIS — M19041 Primary osteoarthritis, right hand: Secondary | ICD-10-CM | POA: Diagnosis present

## 2020-06-05 DIAGNOSIS — I10 Essential (primary) hypertension: Secondary | ICD-10-CM | POA: Diagnosis not present

## 2020-06-05 DIAGNOSIS — I110 Hypertensive heart disease with heart failure: Secondary | ICD-10-CM | POA: Diagnosis present

## 2020-06-05 DIAGNOSIS — L03116 Cellulitis of left lower limb: Secondary | ICD-10-CM | POA: Diagnosis present

## 2020-06-05 DIAGNOSIS — Z993 Dependence on wheelchair: Secondary | ICD-10-CM | POA: Diagnosis not present

## 2020-06-05 DIAGNOSIS — L0390 Cellulitis, unspecified: Secondary | ICD-10-CM | POA: Diagnosis present

## 2020-06-05 DIAGNOSIS — E119 Type 2 diabetes mellitus without complications: Secondary | ICD-10-CM | POA: Diagnosis not present

## 2020-06-05 DIAGNOSIS — G8929 Other chronic pain: Secondary | ICD-10-CM | POA: Diagnosis present

## 2020-06-05 DIAGNOSIS — Z20822 Contact with and (suspected) exposure to covid-19: Secondary | ICD-10-CM | POA: Diagnosis present

## 2020-06-05 DIAGNOSIS — M79641 Pain in right hand: Secondary | ICD-10-CM | POA: Diagnosis present

## 2020-06-05 DIAGNOSIS — M109 Gout, unspecified: Secondary | ICD-10-CM | POA: Diagnosis present

## 2020-06-05 DIAGNOSIS — Z7984 Long term (current) use of oral hypoglycemic drugs: Secondary | ICD-10-CM | POA: Diagnosis not present

## 2020-06-05 DIAGNOSIS — Z79890 Hormone replacement therapy (postmenopausal): Secondary | ICD-10-CM | POA: Diagnosis not present

## 2020-06-05 DIAGNOSIS — Z87891 Personal history of nicotine dependence: Secondary | ICD-10-CM | POA: Diagnosis not present

## 2020-06-05 DIAGNOSIS — J4540 Moderate persistent asthma, uncomplicated: Secondary | ICD-10-CM | POA: Diagnosis present

## 2020-06-05 DIAGNOSIS — I878 Other specified disorders of veins: Secondary | ICD-10-CM | POA: Diagnosis present

## 2020-06-05 DIAGNOSIS — Z79899 Other long term (current) drug therapy: Secondary | ICD-10-CM | POA: Diagnosis not present

## 2020-06-05 DIAGNOSIS — E11628 Type 2 diabetes mellitus with other skin complications: Secondary | ICD-10-CM | POA: Diagnosis present

## 2020-06-05 DIAGNOSIS — Z9884 Bariatric surgery status: Secondary | ICD-10-CM | POA: Diagnosis not present

## 2020-06-05 LAB — CBC
HCT: 36.7 % — ABNORMAL LOW (ref 39.0–52.0)
Hemoglobin: 12.1 g/dL — ABNORMAL LOW (ref 13.0–17.0)
MCH: 32.6 pg (ref 26.0–34.0)
MCHC: 33 g/dL (ref 30.0–36.0)
MCV: 98.9 fL (ref 80.0–100.0)
Platelets: 225 10*3/uL (ref 150–400)
RBC: 3.71 MIL/uL — ABNORMAL LOW (ref 4.22–5.81)
RDW: 13.2 % (ref 11.5–15.5)
WBC: 8.4 10*3/uL (ref 4.0–10.5)
nRBC: 0 % (ref 0.0–0.2)

## 2020-06-05 LAB — BASIC METABOLIC PANEL
Anion gap: 11 (ref 5–15)
BUN: 19 mg/dL (ref 6–20)
CO2: 28 mmol/L (ref 22–32)
Calcium: 8.6 mg/dL — ABNORMAL LOW (ref 8.9–10.3)
Chloride: 92 mmol/L — ABNORMAL LOW (ref 98–111)
Creatinine, Ser: 1.09 mg/dL (ref 0.61–1.24)
GFR, Estimated: >60 mL/min (ref >60)
Glucose, Bld: 236 mg/dL — ABNORMAL HIGH (ref 70–99)
Potassium: 3.9 mmol/L (ref 3.5–5.1)
Sodium: 131 mmol/L — ABNORMAL LOW (ref 135–145)

## 2020-06-05 LAB — GLUCOSE, CAPILLARY
Glucose-Capillary: 224 mg/dL — ABNORMAL HIGH (ref 70–99)
Glucose-Capillary: 208 mg/dL — ABNORMAL HIGH (ref 70–99)
Glucose-Capillary: 198 mg/dL — ABNORMAL HIGH (ref 70–99)
Glucose-Capillary: 161 mg/dL — ABNORMAL HIGH (ref 70–99)

## 2020-06-05 NOTE — Progress Notes (Signed)
Inpatient Diabetes Program Recommendations  AACE/ADA: New Consensus Statement on Inpatient Glycemic Control (2015)  Target Ranges:  Prepandial:   less than 140 mg/dL      Peak postprandial:   less than 180 mg/dL (1-2 hours)      Critically ill patients:  140 - 180 mg/dL   Lab Results  Component Value Date   GLUCAP 208 (H) 06/05/2020   HGBA1C 7.4 (H) 06/03/2020    Review of Glycemic Control Results for KENJI, MAPEL" (MRN [id number]) as of 06/05/2020 12:26  Ref. Range 06/04/2020 20:39 06/05/2020 07:27 06/05/2020 11:24  Glucose-Capillary Latest Ref Range: 70 - 99 mg/dL 210 (H) 224 (H) 208 (H)   Diabetes history: Type 2 DM Outpatient Diabetes medications: Metformin 500 mg BID Current orders for Inpatient glycemic control: Metformin 500 mg BID, Novolog 0-20 units TID  Inpatient Diabetes Program Recommendations:    Consider adding Levemir 18 units QD.   Thanks, Bronson Curb, MSN, RNC-OB Diabetes Coordinator [phone] (8a-5p)

## 2020-06-05 NOTE — Progress Notes (Addendum)
Subjective: HD#2 No acute overnight events. Ronnie Hernandez says his swelling, pain, and redness of his left leg have slightly improved since yesterday. He continues to feel slightly congested with post-nasal drip that he states is chronic with a new cough but doesn't note significant shortness of breath.   Objective:  Vital signs in last 24 hours: Vitals:   06/04/20 1533 06/04/20 2018 06/05/20 0414 06/05/20 1436  BP: (!) 121/101 120/60 140/74 (!) 137/52  Pulse: 90 82 80 87  Resp: 20 16 18 20   Temp: 97.8 F (36.6 C) 98.2 F (36.8 C) 98.2 F (36.8 C) 98.5 F (36.9 C)  TempSrc: Oral Oral Oral Oral  SpO2: 96% 97% 97% 97%  Weight:      Height:       CBC Latest Ref Rng & Units 06/05/2020 06/04/2020 06/03/2020  WBC 4.0 - 10.5 K/uL 8.4 8.3 9.5  Hemoglobin 13.0 - 17.0 g/dL 12.1(L) 12.3(L) 13.3  Hematocrit 39.0 - 52.0 % 36.7(L) 35.1(L) 41.5  Platelets 150 - 400 K/uL 225 219 199   BMP Latest Ref Rng & Units 06/05/2020 06/04/2020 06/03/2020  Glucose 70 - 99 mg/dL 236(H) 269(H) 334(H)  BUN 6 - 20 mg/dL 19 18 18   Creatinine 0.61 - 1.24 mg/dL 1.09 1.11 1.11  BUN/Creat Ratio 6 - 22 (calc) - - -  Sodium 135 - 145 mmol/L 131(L) 133(L) 133(L)  Potassium 3.5 - 5.1 mmol/L 3.9 4.0 4.0  Chloride 98 - 111 mmol/L 92(L) 94(L) 93(L)  CO2 22 - 32 mmol/L 28 29 23   Calcium 8.9 - 10.3 mg/dL 8.6(L) 8.7(L) 9.2   Physical exam:  General: Well developed, well nourished, obese male laying comfortably in bed @30  degress, NAD CV: Distant heart sounds due to body habitus Musculoskeletal: Swan neck deformities of bilateral fingers. 2+ non-pitting edema bilaterally lower extremities Skin: B/L lower extremity erythema, improved on R leg and improved  on L leg, few superficial blisters Neuro: AAO* 3 Psych: Normal mood and affect  Assessment/Plan: Ronnie Hernandez is a 59 yo M with PMHx of HFpEF, Gout, HTN, HLD, Asthma, PE (2018), T2DM, cellulitis, morbid obesity & physical deconditioning presented to [facility]  with leg swelling and redness *7 days and admitted for Left leg cellulitis.  Active Problems:   Bilateral lower extremity edema   Acute on chronic heart failure with preserved ejection fraction (HFpEF) (HCC)   Cellulitis  # L leg Cellulitis Erythema on left lower leg is improving.Erythema on R leg has improved significantly and looks like venous stasis with dermis changes.  WBC 8.4, no systemic signs of infection like fever, chills.    -Continue Cefazolin 2g every 8 hr    #Acute on chronic HFpEF- improving   Presents with improved SOB this AM, along with decreased frequency & intensity of cough.Peripheral edema is improving as well. Had Urine output ~2L. Echo is unchanged from previous one.  - C/WHome lasix dose 40 mg PO - Strict I & O's - Daily weights   # T2DM Recent HbA1c is 7.4. Blood glucose 235 this AM.  -CBG monitoring - SSI  - C/w Metformin 500 mg BID  # Gout   Currently at baseline. Home medications are Allopurinol 300 mg BID , Colchicine 0.6 mg Qday.   -C/W Allopurinol 300 mg BID - Colchicine 0.6 mg   #Hypertension Home medication is Lisinopril 2.5 mg Q day   -C/W Lisinopril 2.5 mg   #Hyperlipidemia   Home medication Crestor 20 mg Qday   -C/w Crestor 20 mg  Prior to Admission Living Arrangement: Home Anticipated Discharge Location: Home Barriers to Discharge: Ongoing medical management Dispo: Anticipated discharge in approximately 1 day(s).   Honor Junes, MD 06/05/2020, 5:05 PM Pager: [phone] After 5pm on weekdays and 1pm on weekends: On Call pager [phone]

## 2020-06-06 LAB — BASIC METABOLIC PANEL
Anion gap: 10 (ref 5–15)
BUN: 20 mg/dL (ref 6–20)
CO2: 28 mmol/L (ref 22–32)
Calcium: 8.4 mg/dL — ABNORMAL LOW (ref 8.9–10.3)
Chloride: 94 mmol/L — ABNORMAL LOW (ref 98–111)
Creatinine, Ser: 1.17 mg/dL (ref 0.61–1.24)
GFR, Estimated: >60 mL/min (ref >60)
Glucose, Bld: 209 mg/dL — ABNORMAL HIGH (ref 70–99)
Potassium: 3.9 mmol/L (ref 3.5–5.1)
Sodium: 132 mmol/L — ABNORMAL LOW (ref 135–145)

## 2020-06-06 LAB — GLUCOSE, CAPILLARY
Glucose-Capillary: 264 mg/dL — ABNORMAL HIGH (ref 70–99)
Glucose-Capillary: 255 mg/dL — ABNORMAL HIGH (ref 70–99)
Glucose-Capillary: 179 mg/dL — ABNORMAL HIGH (ref 70–99)

## 2020-06-06 LAB — CBC
HCT: 37.4 % — ABNORMAL LOW (ref 39.0–52.0)
Hemoglobin: 12.3 g/dL — ABNORMAL LOW (ref 13.0–17.0)
MCH: 32.4 pg (ref 26.0–34.0)
MCHC: 32.9 g/dL (ref 30.0–36.0)
MCV: 98.4 fL (ref 80.0–100.0)
Platelets: 240 10*3/uL (ref 150–400)
RBC: 3.8 MIL/uL — ABNORMAL LOW (ref 4.22–5.81)
RDW: 13.1 % (ref 11.5–15.5)
WBC: 9.3 10*3/uL (ref 4.0–10.5)
nRBC: 0 % (ref 0.0–0.2)

## 2020-06-06 MED ORDER — AMOXICILLIN 875 MG PO TABS: 875.0000 mg | ORAL_TABLET | Freq: Two times a day (BID) | ORAL | 0 refills | Status: AC

## 2020-06-06 NOTE — Progress Notes (Signed)
[name] to be D/C'd per MD order. Discussed with the patient and all questions fully answered. ? VSS, Skin clean, dry and intact without evidence of skin break down, no evidence of skin tears noted. ? IV catheter discontinued intact. Site without signs and symptoms of complications. Dressing and pressure applied. ? An After Visit Summary was printed and given to the patient. Patient informed where to pickup prescriptions. ? D/c education completed with patient/family including follow up instructions, medication list, d/c activities limitations if indicated, with other d/c instructions as indicated by MD - patient able to verbalize understanding, all questions fully answered.  ? Patient instructed to return to ED, call 911, or call MD for any changes in condition.  ? Patient to be escorted via [place], and D/C home via private auto.

## 2020-06-06 NOTE — Discharge Summary (Signed)
Name: Ronnie Hernandez MRN: [id number] DOB: [birth year] 59 y.o. PCP: [facility], [place]  Date of Admission: 06/03/2020 12:07 PM Date of Discharge: 06/07/2020 Attending Physician: Dr. Philipp Ovens Discharge Diagnosis: 1. Left Leg Cellulitis 2. Acute on chronic HFpEF 3. Hyperlipidemia 4. Gout 5.T2DM 6. Hypertension  Discharge Medications: Allergies as of 06/07/2020      Reactions   Uloric [febuxostat] Other (See Comments)   Made the skin on feet and legs "hurt"      Medication List    STOP taking these medications   atorvastatin 10 MG tablet Commonly known as: LIPITOR   doxycycline 100 MG EC tablet Commonly known as: DORYX     TAKE these medications   albuterol 108 (90 Base) MCG/ACT inhaler Commonly known as: VENTOLIN HFA Inhale 1-2 puffs into the lungs every 6 (six) hours as needed for wheezing or shortness of breath.   allopurinol 300 MG tablet Commonly known as: ZYLOPRIM Take 1 tablet (300 mg total) by mouth 2 (two) times daily.   amoxicillin 875 MG tablet Commonly known as: AMOXIL Take 1 tablet (875 mg total) by mouth 2 (two) times daily for 4 days.   ascorbic acid 500 MG tablet Commonly known as: VITAMIN C Take 500 mg by mouth daily.   benzonatate 100 MG capsule Commonly known as: TESSALON Take 1 capsule (100 mg total) by mouth every 8 (eight) hours. What changed:   when to take this  reasons to take this   colchicine 0.6 MG tablet Take 1 tablet (0.6 mg total) by mouth daily.   diclofenac Sodium 1 % Gel Commonly known as: Voltaren Apply 2 g topically 4 (four) times daily. What changed:   when to take this  reasons to take this   fluticasone 50 MCG/ACT nasal spray Commonly known as: FLONASE Place 1 spray into both nostrils daily as needed for allergies or rhinitis.   furosemide 40 MG tablet Commonly known as: LASIX Take one tablet by mouth once daily What changed:   how much to take  how to take this  when  to take this  reasons to take this  additional instructions   ibuprofen 200 MG tablet Commonly known as: ADVIL Take 400 mg by mouth every 6 (six) hours as needed for fever or headache.   levothyroxine 100 MCG tablet Commonly known as: SYNTHROID Take 100 mcg by mouth daily before breakfast.   lisinopril 2.5 MG tablet Commonly known as: ZESTRIL TAKE 1 TABLET BY MOUTH EVERY DAY   metFORMIN 500 MG tablet Commonly known as: GLUCOPHAGE Take 500 mg by mouth 2 (two) times daily with a meal.   multivitamin with minerals Tabs tablet Take 1 tablet by mouth daily. One a Day Men's   nystatin powder Commonly known as: MYCOSTATIN/NYSTOP Apply 1 application topically 3 (three) times daily. What changed: when to take this   rosuvastatin 20 MG tablet Commonly known as: CRESTOR Take 20 mg by mouth daily.   UNABLE TO FIND bariatric drop arm commode Dx) R54, E66.01, I50.9, M19.9   UNABLE TO FIND Bariatric tub transfer bench Dx) R54, E66.01, I50.9, M19.9   UNABLE TO FIND Bariatric lift chair. Dx) R54, E66.01, I50.9, M19.9   UNABLE TO FIND Heavy duty Cane Dx) R54, E66.01, I50.9, M19.9   vitamin D3 50 MCG (2000 UT) Caps Take 2,000 Units by mouth daily.       Disposition and follow-up:   Ronnie Hernandez was discharged from [facility] in Stable condition.  At the hospital follow up visit please address:  1. - L leg Cellulitis: Please take Amoxicillin 875 mg BID x 4 days to complete 7 day course     - Acute on chronic HFpEF- Resolved: Please continue home lasix 40 mg daily     -For HTN, HLD, GOUT, T2DM please follow up with [facility] closely.       2.  Labs / imaging needed at time of follow-up: None  3.  Pending labs/ test needing follow-up: None  Follow-up Appointments: N/A   Hospital Course by problem list: Ronnie Hernandez is a 59 yo M with PMHx of HFpEF, Gout, HTN, HLD, Asthma, PE (2018), T2DM, cellulitis, morbid obesity & physical deconditioning  presented to [facility] with leg swelling and redness *7 days.  # L leg Cellulitis Bilateral lower extremity erythema, although more prominent on left lower leg along with edema and warmth, non-purulent. Can be possibly due to venous stasis but favors more toward cellulitis on left due to profound redness. WBC 9.5, no systemic signs of infection like fever, chills. He was started on Keflex 500 mg Q6h but given degree of localized infection and Left LE with significant warmth, erythema, induration, and multiple fluid filled blisters changed to IV Cefazolin 2g every 8 hr. After improvement discharged on Amoxicillin 875 mg BID x 4 days to complete 7 day course     #Acute on chronic HFpEF- Resolved   Presents with Bilateral lower extremity edema, gradually worsening dyspnea on exertion, acute on chronic productive cough most probably 2/2 to HFpEF. BNP 109.5, although may be falsely low by morbid obesity. Given the left leg is more swollen than right, B/L venous duplex of the bilateral lower extremities was ordered and negative for DVT. Patient takes 40 mg PO lasix at home and did take 2 doses today without adequate response. Due to this, he is currently admitted for IV Lasix. After a day due to improvement re-started on home lasix 40 mg daily. Last echo was +1 year ago, so will repeat to evaluate for any changes.        # Gout   Currently at baseline. Home medications are Allopurinol 300 mg BID    #Hypertension Home medication is Lisinopril 2.5 mg Q day and continued on same.    #Hyperlipidemia   Home medication Crestor 20 mg Qday and continued on same.   # T2DM Last HbA1c 6.4. CBG monitoring and SSI, also Metformin was started.  Discharge Vitals:   BP (!) 116/59 (BP Location: Right Wrist)   Pulse 88   Temp 97.6 F (36.4 C) (Oral)   Resp 20   Ht 5\' 6"  (1.676 m)   Wt (!) 484 lb (219.5 kg)   SpO2 100%   BMI 78.12 kg/m   Pertinent Labs, Studies, and Procedures:  CBC Latest Ref Rng & Units  06/06/2020 06/05/2020 06/04/2020  WBC 4.0 - 10.5 K/uL 9.3 8.4 8.3  Hemoglobin 13.0 - 17.0 g/dL 12.3(L) 12.1(L) 12.3(L)  Hematocrit 39.0 - 52.0 % 37.4(L) 36.7(L) 35.1(L)  Platelets 150 - 400 K/uL 240 225 219    BMP Latest Ref Rng & Units 06/06/2020 06/05/2020 06/04/2020  Glucose 70 - 99 mg/dL 209(H) 236(H) 269(H)  BUN 6 - 20 mg/dL 20 19 18   Creatinine 0.61 - 1.24 mg/dL 1.17 1.09 1.11  BUN/Creat Ratio 6 - 22 (calc) - - -  Sodium 135 - 145 mmol/L 132(L) 131(L) 133(L)  Potassium 3.5 - 5.1 mmol/L 3.9 3.9 4.0  Chloride 98 - 111 mmol/L  94(L) 92(L) 94(L)  CO2 22 - 32 mmol/L 28 28 29   Calcium 8.9 - 10.3 mg/dL 8.4(L) 8.6(L) 8.7(L)   Chest x-ray:  FINDINGS: The heart size and mediastinal contours are within normal limits. Both lungs are clear. The visualized skeletal structures are unremarkable.  IMPRESSION: No active disease.  VAS Lower extremity:  Summary:  RIGHT:  - There is no evidence of deep vein thrombosis in the lower extremity.  However, portions of this examination were limited- see technologist  comments above.    - No cystic structure found in the popliteal fossa.    LEFT:  - There is no evidence of deep vein thrombosis in the lower extremity.  However, portions of this examination were limited- see technologist  comments above.    - No cystic structure found in the popliteal fossa.  Discharge Instructions: Discharge Instructions    (HEART FAILURE PATIENTS) Call MD:  Anytime you have any of the following symptoms: 1) 3 pound weight gain in 24 hours or 5 pounds in 1 week 2) shortness of breath, with or without a dry hacking cough 3) swelling in the hands, feet or stomach 4) if you have to sleep on extra pillows at night in order to breathe.   Complete by: As directed    Call MD for:  difficulty breathing, headache or visual disturbances   Complete by: As directed    Call MD for:  extreme fatigue   Complete by: As directed    Call MD for:  persistant dizziness  or light-headedness   Complete by: As directed    Call MD for:  persistant nausea and vomiting   Complete by: As directed    Call MD for:  redness, tenderness, or signs of infection (pain, swelling, redness, odor or green/yellow discharge around incision site)   Complete by: As directed    Call MD for:  severe uncontrolled pain   Complete by: As directed    Call MD for:  temperature >100.4   Complete by: As directed    Diet - low sodium heart healthy   Complete by: As directed    Discharge instructions   Complete by: As directed    Ronnie Hernandez,   You were admitted to the hospital for leg swelling and an infection called cellulitis. For the swelling, you were treated with IV fluid medication called Furosemide. For the infection, you received IV antibiotics that we will transition to pills on discharge. The antibiotic is called Amoxicillin and you will take it twice a day for 4 days starting with the evening dose on 06/06/2020. Please continue to follow up with [facility].   We are happy that you are feeling better and we wish you the best! Thank you for allowing [place] to participate in your care.   - Dr. Charleen Kirks   Increase activity slowly   Complete by: As directed       Signed: Dr.Jaire Pinkham Pager: [phone] After 5pm on weekdays and 1pm on weekends: On Call pager [phone]  06/10/2020, 6:15 PM

## 2020-06-06 NOTE — TOC Initial Note (Addendum)
Transition of Care [facility]) - Initial/Assessment Note    Patient Details  Name: Ronnie Hernandez MRN: [id number] Date of Birth: 07-Oct-1960  [facility] CM/SW Contact:    Marilu Favre, RN Phone Number: 06/06/2020, 1:01 PM  Clinical Narrative:                 Confirmed face sheet information. Patient from home with niece. Spoke to [employer] on speaker phone, she is aware patient discharging to home today and she will be at the home all day/evening. They are requesting ambulance transport home. NCM called [facility] left a message for transportation coordinator Wilmont Olund. Awaiting call back. Patient aware    1520 Just heard back from [place] at [facility] they will pay for [facility] transport home. Patient aware. [facility] paperwork placed in patient's shadow chart . Per nurse patient ready . [facility] called  Expected Discharge Plan: Home/Self Care     Patient Goals and CMS Choice Patient states their goals for this hospitalization and ongoing recovery are:: to return to home CMS Medicare.gov Compare Post Acute Care list provided to:: Patient Choice offered to / list presented to : Patient  Expected Discharge Plan and Services Expected Discharge Plan: Home/Self Care       Living arrangements for the past 2 months: Apartment Expected Discharge Date: 06/06/20               DME Arranged: N/A         HH Arranged: NA          Prior Living Arrangements/Services Living arrangements for the past 2 months: Apartment Lives with:: Relatives Patient language and need for interpreter reviewed:: Yes Do you feel safe going back to the place where you live?: Yes      Need for Family Participation in Patient Care: Yes (Comment) Care giver support system in place?: Yes (comment) Current home services: DME Criminal Activity/Legal Involvement Pertinent to Current Situation/Hospitalization: No - Comment as needed  Activities of Daily Living Home Assistive Devices/Equipment: Cane (specify quad or  straight) (quad) ADL Screening (condition at time of admission) Patient's cognitive ability adequate to safely complete daily activities?: Yes Is the patient deaf or have difficulty hearing?: No Does the patient have difficulty seeing, even when wearing glasses/contacts?: No Does the patient have difficulty concentrating, remembering, or making decisions?: No Patient able to express need for assistance with ADLs?: Yes Does the patient have difficulty dressing or bathing?: Yes Independently performs ADLs?: No Communication: Independent Dressing (OT): Needs assistance Is this a change from baseline?: Pre-admission baseline Grooming: Needs assistance Is this a change from baseline?: Pre-admission baseline Feeding: Independent Bathing: Needs assistance Is this a change from baseline?: Pre-admission baseline Toileting: Needs assistance Is this a change from baseline?: Pre-admission baseline In/Out Bed: Needs assistance Is this a change from baseline?: Pre-admission baseline Walks in Home: Needs assistance Is this a change from baseline?: Pre-admission baseline Does the patient have difficulty walking or climbing stairs?: Yes Weakness of Legs: Both Weakness of Arms/Hands: Both  Permission Sought/Granted   Permission granted to share information with : Yes, Verbal Permission Granted  Share Information with NAME: Huey Scalia [phone] niece           Emotional Assessment Appearance:: Appears stated age Attitude/Demeanor/Rapport: Engaged Affect (typically observed): Accepting Orientation: : Oriented to Self,Oriented to Place,Oriented to  Time,Oriented to Situation Alcohol / Substance Use: Not Applicable Psych Involvement: No (comment)  Admission diagnosis:  Cough [R05.9] Bilateral leg edema [R60.0] Cellulitis of left lower  extremity [R91.638] Bilateral lower extremity edema [R60.0] Cellulitis [L03.90] Patient Active Problem List   Diagnosis Date Noted  . Cellulitis  06/05/2020  . Acute on chronic heart failure with preserved ejection fraction (HFpEF) ([place])   . Bilateral lower extremity edema 06/03/2020  . Polyarthritis 08/17/2019  . Physical deconditioning 08/16/2019  . Pressure injury of skin 08/16/2019  . Gout attack 08/15/2019  . Moderate persistent asthma with exacerbation   . Acute respiratory failure with hypoxia ([place]) 08/26/2018  . Acute diastolic CHF (congestive heart failure) ([place]) 08/26/2018  . Left knee pain 03/31/2018  . Knee pain   . Acute gout 04/18/2017  . Pulmonary embolus ([place]) 08/05/2016  . Hyperglycemia, drug-induced 07/08/2016  . Impaired glucose tolerance 07/08/2016  . Leukocytosis 07/06/2016  . Unable to walk 07/06/2016  . Acute gouty arthritis 07/06/2016  . Abnormality of gait 02/18/2016  . Cellulitis of left lower extremity 01/27/2016  . Type 2 diabetes mellitus ([place]) 12/25/2013  . Drug-induced gout 12/25/2013  . Hyperlipidemia 08/01/2013  . Morbid obesity ([place])   . Hypertension   . Gout   . Colon cancer screening 07/27/2011  . Benign neoplasm of colon 07/27/2011   PCP:  [facility], [place]:   [facility] [place] [address] Phone: [phone] Fax: [phone]     Social Determinants of Health (SDOH) Interventions    Readmission Risk Interventions No flowsheet data found.

## 2020-06-06 NOTE — Progress Notes (Signed)
Results for MELIK, BLANCETT" (MRN [id number]) as of 06/06/2020 12:10  Ref. Range 06/05/2020 11:24 06/05/2020 16:43 06/05/2020 19:53 06/06/2020 08:18 06/06/2020 12:02  Glucose-Capillary Latest Ref Range: 70 - 99 mg/dL 208 (H) 198 (H) 161 (H) 264 (H) 255 (H)  Noted that blood sugars continue to be greater than 180 mg/dl.  Recommend adding Levemir 15 units daily if blood sugars continue to be elevated.   Harvel Ricks RN BSN CDE Diabetes Coordinator Pager: [phone]  8am-5pm

## 2020-06-06 NOTE — Progress Notes (Signed)
Patient still waiting for [facility] transport.

## 2020-06-06 NOTE — Progress Notes (Signed)
   Subjective:   No acute overnight events.  Ronnie Hernandez says he is feeling mildly nauseated and feels the room temperature has been fluctuating. He endorses coughing up phlegm that is white. Overall though, Ronnie Hernandez states he is feeling a lot better and is looking forward to going home today.   Objective:  Vital signs in last 24 hours: Vitals:   06/05/20 1436 06/05/20 1948 06/06/20 0500 06/06/20 0541  BP: (!) 137/52 140/71  (!) 142/59  Pulse: 87 86  87  Resp: 20 20  20   Temp: 98.5 F (36.9 C) 98.3 F (36.8 C)  98.2 F (36.8 C)  TempSrc: Oral Oral  Oral  SpO2: 97% 97%  96%  Weight:   (!) 219.5 kg   Height:       Physical exam:  General: Well developed, well nourished, morbidly obese male laying comfortably in bed @30  degress, NAD CV: Distant heart sounds due to body habitus Respiratory: Clear to auscultation bilaterally.  Skin: Left lower extremity with minimal erythema compared to admission. No blisters or purulent drainage.  Neuro: AAO* 3 Psych: Normal mood and affect  Assessment/Plan: Ronnie Hernandez is a 59 yo M with PMHx of HFpEF, Gout, HTN, HLD, Asthma, PE (2018), T2DM, cellulitis, morbid obesity & physical deconditioning presented to [facility] with leg swelling and redness *7 days and admitted for Left leg cellulitis.  Active Problems:   Bilateral lower extremity edema   Acute on chronic heart failure with preserved ejection fraction (HFpEF) (HCC)   Cellulitis  # L leg Cellulitis Significantly improved today on examination. Will discharge on Amoxicillin to complete a 7 day course.   - Amoxicillin 875 mg BID x 4 days to complete 7 day course.    #Acute on chronic HFpEF Resolved and back on home Lasix.   - C/W Home lasix dose 40 mg PO - Follow up with [facility] for further titration   # T2DM - Resume home medication on discharge  - Recommend follow up with [facility] for additional evaluation and management given elevated CBGs while here.    Prior to Admission Living  Arrangement: Home Anticipated Discharge Location: Home Barriers to Discharge: None Dispo: Anticipated discharge in approximately 0 day(s).   Dr. Jose Persia Internal Medicine PGY-2  Pager: [phone] After 5pm on weekdays and 1pm on weekends: On Call pager [phone]  06/06/2020, 1:02 PM

## 2020-06-07 NOTE — Progress Notes (Signed)
[facility] just arrived with a bigger stretcher and 4 staff.  Discharged packet given to patient/[facility] staff.

## 2020-06-07 NOTE — Progress Notes (Signed)
[facility] transport arrived but will be back with a bigger stretcher since patient will not fit into the current stretcher.  Patient understand the situation and patiently waiting for [facility]'s return with a bigger stretcher.

## 2020-06-10 NOTE — Hospital Course (Signed)
Ronnie Hernandez is a 59 yo M with PMHx of HFpEF, Gout, HTN, HLD, Asthma, PE (2018), T2DM, cellulitis, morbid obesity & physical deconditioning presented to [facility] with leg swelling and redness *7 days.  # L leg Cellulitis Bilateral lower extremity erythema, although more prominent on left lower leg along with edema and warmth, non-purulent. Can be possibly due to venous stasis but favors more toward cellulitis on left due to profound redness. WBC 9.5, no systemic signs of infection like fever, chills. He was started on Keflex 500 mg Q6h but given degree of localized infection and Left LE with significant warmth, erythema, induration, and multiple fluid filled blisters changed to IV Cefazolin 2g every 8 hr. After improvement discharged on Amoxicillin 875 mg BID x 4 days to complete 7 day course     #Acute on chronic HFpEF- Resolved   Presents with Bilateral lower extremity edema, gradually worsening dyspnea on exertion, acute on chronic productive cough most probably 2/2 to HFpEF. BNP 109.5, although may be falsely low by morbid obesity. Given the left leg is more swollen than right, B/L venous duplex of the bilateral lower extremities was ordered and negative for DVT. Patient takes 40 mg PO lasix at home and did take 2 doses today without adequate response. Due to this, he is currently admitted for IV Lasix. After a day due to improvement re-started on home lasix 40 mg daily. Last echo was +1 year ago, so will repeat to evaluate for any changes.        # Gout   Currently at baseline. Home medications are Allopurinol 300 mg BID    #Hypertension Home medication is Lisinopril 2.5 mg Q day and continued on same.    #Hyperlipidemia   Home medication Crestor 20 mg Qday and continued on same.   # T2DM Last HbA1c 6.4. CBG monitoring and SSI, also Metformin was started.

## 2021-03-04 ENCOUNTER — Telehealth: Payer: Self-pay | Admitting: Internal Medicine

## 2021-03-04 NOTE — Telephone Encounter (Signed)
OV 1st

## 2021-03-04 NOTE — Telephone Encounter (Signed)
Please see note Below and advise if pt can be a direct colon or would you like an OV

## 2021-03-04 NOTE — Telephone Encounter (Signed)
Please see below. Pt needs to be scheduled for an OV with Dr. Hilarie Fredrickson first.

## 2021-04-30 ENCOUNTER — Ambulatory Visit: Payer: Medicare (Managed Care) | Admitting: Internal Medicine

## 2021-07-30 ENCOUNTER — Ambulatory Visit (INDEPENDENT_AMBULATORY_CARE_PROVIDER_SITE_OTHER): Payer: Medicare (Managed Care) | Admitting: Internal Medicine

## 2021-07-30 ENCOUNTER — Encounter: Payer: Self-pay | Admitting: Internal Medicine

## 2021-07-30 DIAGNOSIS — Z86010 Personal history of colonic polyps: Secondary | ICD-10-CM | POA: Diagnosis not present

## 2021-07-30 NOTE — Progress Notes (Signed)
Patient ID: Ronnie Hernandez, male   DOB: 1961-01-20, 61 y.o.   MRN: [id number] HPI: [name] is a 61 year old male with a history of remote nonadvanced adenomatous colon polyps (2 removed in February 2013), morbid obesity, diabetes, hypertension, hyperlipidemia and gout who is seen to evaluate possible surveillance colonoscopy.  He is here alone today.  He had a colonoscopy performed for screening which was his first and last colonoscopy on 07/27/2011.  This revealed 2 polyps measuring 2 and 5 mm which were removed and found to be adenoma without high-grade dysplasia.  The remaining exam was normal.  The prep was good.  He reports that he does not have specific GI complaint.  He has no issues with bowel movement though at times they can be loose.  He does not have constipation.  No frequent abdominal pain no visible blood in stool or melena.  He does have a bedside commode which he is able to transfer to but he is unable to wipe himself after bowel movement.  He denies upper GI and hepatobiliary complaint today.  He has recently completed antibiotics for lower extremity cellulitis.  He does report he has a history of congestive heart failure and has seen cardiology.  He is taking furosemide.  He continues to have issues with arthritis related to gout.  Past Medical History:  Diagnosis Date   Arthritis    "knees, hands" (04/19/2017)   Cellulitis 05/2020   bilateral legs   Childhood asthma    Diabetes type 2, uncontrolled    Gout    "have had it in both feet, both hands, right shoulder, back" (04/19/2017)   Hyperlipidemia    Hypertension    Morbidly obese ([place])    Pulmonary embolism ([place]) 2017?    Past Surgical History:  Procedure Laterality Date   COLONOSCOPY W/ BIOPSIES AND POLYPECTOMY  07/2011   [facility] 07/27/2011    Outpatient Medications Prior to Visit  Medication Sig Dispense Refill   albuterol (VENTOLIN HFA) 108 (90 Base) MCG/ACT inhaler Inhale 1-2 puffs into the lungs every 6  (six) hours as needed for wheezing or shortness of breath.     allopurinol (ZYLOPRIM) 300 MG tablet Take 1 tablet (300 mg total) by mouth 2 (two) times daily. 180 tablet 1   ascorbic acid (VITAMIN C) 500 MG tablet Take 500 mg by mouth daily.     Cholecalciferol (VITAMIN D3) 50 MCG (2000 UT) CAPS Take 2,000 Units by mouth daily.     colchicine 0.6 MG tablet Take 1 tablet (0.6 mg total) by mouth daily. 30 tablet 0   fluticasone (FLONASE) 50 MCG/ACT nasal spray Place 1 spray into both nostrils daily as needed for allergies or rhinitis.     furosemide (LASIX) 40 MG tablet Take one tablet by mouth once daily (Patient taking differently: Take 40-80 mg by mouth daily as needed for fluid.) 90 tablet 0   ibuprofen (ADVIL) 200 MG tablet Take 400 mg by mouth every 6 (six) hours as needed for fever or headache.     levothyroxine (SYNTHROID) 100 MCG tablet Take 100 mcg by mouth daily before breakfast.     lisinopril (ZESTRIL) 2.5 MG tablet TAKE 1 TABLET BY MOUTH EVERY DAY (Patient taking differently: Take 2.5 mg by mouth daily.) 90 tablet 1   metFORMIN (GLUCOPHAGE) 500 MG tablet Take 500 mg by mouth 2 (two) times daily with a meal.     Multiple Vitamin (MULTIVITAMIN WITH MINERALS) TABS tablet Take 1 tablet by mouth daily. One a  Day Men's     nystatin (MYCOSTATIN/NYSTOP) powder Apply 1 application topically 3 (three) times daily. (Patient taking differently: Apply 1 application topically 2 (two) times daily.) 15 g 0   rosuvastatin (CRESTOR) 20 MG tablet Take 20 mg by mouth daily.     UNABLE TO FIND bariatric drop arm commode Dx) R54, E66.01, I50.9, M19.9 1 each 0   UNABLE TO FIND Bariatric tub transfer bench Dx) R54, E66.01, I50.9, M19.9 1 each 0   UNABLE TO FIND Bariatric lift chair. Dx) R54, E66.01, I50.9, M19.9 1 Device 0   UNABLE TO FIND Heavy duty Cane Dx) R54, E66.01, I50.9, M19.9 1 each 0   benzonatate (TESSALON) 100 MG capsule Take 1 capsule (100 mg total) by mouth every 8 (eight) hours. (Patient taking  differently: Take 100 mg by mouth 3 (three) times daily as needed for cough.) 21 capsule 0   diclofenac Sodium (VOLTAREN) 1 % GEL Apply 2 g topically 4 (four) times daily. (Patient taking differently: Apply 2 g topically 4 (four) times daily as needed (pain).) 100 g 2   No facility-administered medications prior to visit.    Allergies  Allergen Reactions   Uloric [Febuxostat] Other (See Comments)    Made the skin on feet and legs "hurt"    Family History  Problem Relation Age of Onset   Colon cancer Maternal Grandfather 92   Heart attack Father    Stomach cancer Maternal Aunt 80    Social History   Tobacco Use   Smoking status: Former    Types: Cigarettes    Quit date: 02/09/1982    Years since quitting: 39.4   Smokeless tobacco: Never  Vaping Use   Vaping Use: Never used  Substance Use Topics   Alcohol use: No   Drug use: Not Currently    Comment: 04/19/2017 "nothing since I was 21"    ROS: As per history of present illness, otherwise negative  BP 136/77    Pulse 96    Wt (!) 483 lb (219.1 kg)    BMI 77.96 kg/m  Gen: awake, alert, NAD, sitting in electric wheelchair HEENT: anicteric CV: Distant, regular heart sounds Pulm: Clear to auscultation bilaterally Abd: soft, obese, NT/ND, +BS throughout Ext: no c/c, edema/lymphedema and chronic venous stasis change Neuro: nonfocal  ASSESSMENT/PLAN: 61 year old male with a history of remote nonadvanced adenomatous colon polyps (2 removed in February 2013), morbid obesity, diabetes, hypertension, hyperlipidemia and gout who is seen to evaluate possible surveillance colonoscopy.   History of adenomatous colon polyps --we discussed his history of adenomatous colon polyps, he had 2 nonadvanced adenomas removed at initial colonoscopy in 2013.  Given his morbid obesity, very limited mobility as well as history of congestive heart failure the risks of repeat colonoscopy in the outpatient hospital setting are felt to exceed the  benefit.  We discussed this today at length and he is in agreement and understands this recommendation.  He wishes to not proceed with colonoscopy at this time.  I also have concerns that he would not be able to adequately prep for the procedure and he shares these concerns.  If in the future he is able to have meaningful weight loss we can reconsider colonoscopy at that time. --Colonoscopy deferred due to morbid obesity, CHF history and risks for colonoscopy with sedation felt to outweigh benefit at present    [place], [place],  [place] [address]

## 2021-07-30 NOTE — Patient Instructions (Signed)
Will follow-up in a few years regarding colonoscopy recall.   If you are age 62 or older, your body mass index should be between 23-30. Your Body mass index is 77.96 kg/m. If this is out of the aforementioned range listed, please consider follow up with your Primary Care Provider.  If you are age 50 or younger, your body mass index should be between 19-25. Your Body mass index is 77.96 kg/m. If this is out of the aformentioned range listed, please consider follow up with your Primary Care Provider.   ________________________________________________________  The [facility] providers would like to encourage you to use [facility] to communicate with providers for non-urgent requests or questions.  Due to long hold times on the telephone, sending your provider a message by [facility] may be a faster and more efficient way to get a response.  Please allow 48 business hours for a response.  Please remember that this is for non-urgent requests.  _______________________________________________________  Thank you for choosing me and [facility].  Dr.Jay Pyrtle

## 2021-08-26 ENCOUNTER — Emergency Department (HOSPITAL_COMMUNITY)
Admission: EM | Admit: 2021-08-26 | Discharge: 2021-08-26 | Disposition: A | Discharge status: Home / Self Care | Payer: Medicare (Managed Care) | Attending: Emergency Medicine | Admitting: Emergency Medicine

## 2021-08-26 ENCOUNTER — Emergency Department (HOSPITAL_COMMUNITY): Payer: Medicare (Managed Care)

## 2021-08-26 DIAGNOSIS — R051 Acute cough: Secondary | ICD-10-CM

## 2021-08-26 DIAGNOSIS — U071 COVID-19: Secondary | ICD-10-CM | POA: Insufficient documentation

## 2021-08-26 DIAGNOSIS — R059 Cough, unspecified: Secondary | ICD-10-CM | POA: Diagnosis present

## 2021-08-26 DIAGNOSIS — R079 Chest pain, unspecified: Secondary | ICD-10-CM | POA: Diagnosis not present

## 2021-08-26 LAB — BASIC METABOLIC PANEL
Anion gap: 13 (ref 5–15)
BUN: 12 mg/dL (ref 8–23)
CO2: 23 mmol/L (ref 22–32)
Calcium: 9.2 mg/dL (ref 8.9–10.3)
Chloride: 99 mmol/L (ref 98–111)
Creatinine, Ser: 1.11 mg/dL (ref 0.61–1.24)
GFR, Estimated: >60 mL/min (ref >60)
Glucose, Bld: 227 mg/dL — ABNORMAL HIGH (ref 70–99)
Potassium: 4.5 mmol/L (ref 3.5–5.1)
Sodium: 135 mmol/L (ref 135–145)

## 2021-08-26 LAB — RESP PANEL BY RT-PCR (FLU A&B, COVID) ARPGX2
Influenza A by PCR: NEGATIVE
Influenza B by PCR: NEGATIVE
SARS Coronavirus 2 by RT PCR: POSITIVE — AB

## 2021-08-26 LAB — TROPONIN I (HIGH SENSITIVITY)
Troponin I (High Sensitivity): 9 ng/L (ref <18)
Troponin I (High Sensitivity): 12 ng/L (ref <18)

## 2021-08-26 LAB — CBC
HCT: 44.6 % (ref 39.0–52.0)
Hemoglobin: 14.3 g/dL (ref 13.0–17.0)
MCH: 32.9 pg (ref 26.0–34.0)
MCHC: 32.1 g/dL (ref 30.0–36.0)
MCV: 102.8 fL — ABNORMAL HIGH (ref 80.0–100.0)
Platelets: 187 10*3/uL (ref 150–400)
RBC: 4.34 MIL/uL (ref 4.22–5.81)
RDW: 14.9 % (ref 11.5–15.5)
WBC: 5.4 10*3/uL (ref 4.0–10.5)
nRBC: 0 % (ref 0.0–0.2)

## 2021-08-26 MED ORDER — NIRMATRELVIR/RITONAVIR (PAXLOVID)TABLET: 3.0000 | ORAL_TABLET | Freq: Two times a day (BID) | ORAL | 0 refills | Status: AC

## 2021-08-26 NOTE — Discharge Instructions (Addendum)
Please follow-up with your primary care doctor to discuss your COVID diagnosis and your symptoms.  If you develop worsening shortness of breath, any chest pain or other new concerning symptom, come back to ER for reassessment. ? ?While taking the Paxlovid -please hold your Crestor.  You may resume your Crestor once the course of this new medication has finished. ?

## 2021-08-26 NOTE — ED Triage Notes (Signed)
Pt. Stated, Ronnie Hernandez had a cough and chest pain from cough started yesterday. My sister has COVID ?

## 2021-08-26 NOTE — ED Provider Notes (Signed)
[facility] EMERGENCY DEPARTMENT Provider Note   CSN: [id number] Arrival date & time: 08/26/21  2458     History  Chief Complaint  Patient presents with   Cough   Chest Pain    Ronnie Hernandez is a 61 y.o. male.  Presenting to ER with concern for cough, chest pain.  Symptoms ongoing for the past couple days, first noted slight cough on Sunday.  States that he was at a [employer] for his sister over the weekend.  He endorsed chest pain to triage RN but when I evaluated him he denied any chest pain.  States that he is just having a nagging, nonproductive cough.  Some general malaise as well.  Denies any difficulty in breathing or fevers.  HPI     Home Medications Prior to Admission medications   Medication Sig Start Date End Date Taking? Authorizing Provider  nirmatrelvir/ritonavir EUA (PAXLOVID) 20 x 150 MG & 10 x '100MG'$  TABS Take 3 tablets by mouth 2 (two) times daily for 5 days. Patient GFR is >60. Take nirmatrelvir (150 mg) two tablets twice daily for 5 days and ritonavir (100 mg) one tablet twice daily for 5 days. 08/26/21 08/31/21 Yes Lainy Wrobleski, Ellwood Dense, MD  albuterol (VENTOLIN HFA) 108 (90 Base) MCG/ACT inhaler Inhale 1-2 puffs into the lungs every 6 (six) hours as needed for wheezing or shortness of breath.    [provider]  allopurinol (ZYLOPRIM) 300 MG tablet Take 1 tablet (300 mg total) by mouth 2 (two) times daily. 08/27/19   [name], NP  ascorbic acid (VITAMIN C) 500 MG tablet Take 500 mg by mouth daily.    [provider]  Cholecalciferol (VITAMIN D3) 50 MCG (2000 UT) CAPS Take 2,000 Units by mouth daily.    [provider]  colchicine 0.6 MG tablet Take 1 tablet (0.6 mg total) by mouth daily. 04/04/18   Shelly Coss, MD  fluticasone (FLONASE) 50 MCG/ACT nasal spray Place 1 spray into both nostrils daily as needed for allergies or rhinitis.    [provider]  furosemide (LASIX) 40 MG tablet Take one  tablet by mouth once daily Patient taking differently: Take 40-80 mg by mouth daily as needed for fluid. 07/25/19   [name], NP  ibuprofen (ADVIL) 200 MG tablet Take 400 mg by mouth every 6 (six) hours as needed for fever or headache.    [provider]  levothyroxine (SYNTHROID) 100 MCG tablet Take 100 mcg by mouth daily before breakfast.    [provider]  lisinopril (ZESTRIL) 2.5 MG tablet TAKE 1 TABLET BY MOUTH EVERY DAY Patient taking differently: Take 2.5 mg by mouth daily. 04/30/19   [name], NP  metFORMIN (GLUCOPHAGE) 500 MG tablet Take 500 mg by mouth 2 (two) times daily with a meal.    [provider]  Multiple Vitamin (MULTIVITAMIN WITH MINERALS) TABS tablet Take 1 tablet by mouth daily. One a Day Men's    [provider]  nystatin (MYCOSTATIN/NYSTOP) powder Apply 1 application topically 3 (three) times daily. Patient taking differently: Apply 1 application topically 2 (two) times daily. 08/24/19   [name], NP  rosuvastatin (CRESTOR) 20 MG tablet Take 20 mg by mouth daily.    [provider]  UNABLE TO FIND bariatric drop arm commode Dx) R54, E66.01, I50.9, M19.9 07/09/19   [name], NP  UNABLE TO FIND Bariatric tub transfer bench Dx) R54, E66.01, I50.9, M19.9 07/09/19   Dewaine Oats,  Carlos American, NP  UNABLE TO FIND Bariatric lift chair. Dx) R54, E66.01, I50.9, M19.9 07/09/19   [name], NP  UNABLE TO FIND Heavy duty Kasandra Knudsen Dx) R54, E66.01, I50.9, M19.9 07/09/19   [name], NP      Allergies    Uloric [febuxostat]    Review of Systems   Review of Systems  Constitutional:  Positive for fatigue. Negative for chills and fever.  HENT:  Negative for ear pain and sore throat.   Eyes:  Negative for pain and visual disturbance.  Respiratory:  Positive for cough. Negative for shortness of breath.   Cardiovascular:  Negative for chest pain and palpitations.  Gastrointestinal:  Negative for abdominal  pain and vomiting.  Genitourinary:  Negative for dysuria and hematuria.  Musculoskeletal:  Negative for arthralgias and back pain.  Skin:  Negative for color change and rash.  Neurological:  Negative for seizures and syncope.  All other systems reviewed and are negative.  Physical Exam Updated Vital Signs BP (!) 162/126 (BP Location: Left Arm)    Pulse 96    Temp 98.6 F (37 C) (Oral)    Resp 18    SpO2 97%  Physical Exam Vitals and nursing note reviewed.  Constitutional:      General: He is not in acute distress.    Appearance: He is well-developed.  HENT:     Head: Normocephalic and atraumatic.  Eyes:     Conjunctiva/sclera: Conjunctivae normal.  Cardiovascular:     Rate and Rhythm: Normal rate and regular rhythm.     Heart sounds: No murmur heard. Pulmonary:     Effort: Pulmonary effort is normal. No respiratory distress.  Abdominal:     Palpations: Abdomen is soft.     Tenderness: There is no abdominal tenderness.  Musculoskeletal:        General: No swelling.     Cervical back: Neck supple.  Skin:    General: Skin is warm and dry.     Capillary Refill: Capillary refill takes less than 2 seconds.  Neurological:     Mental Status: He is alert.  Psychiatric:        Mood and Affect: Mood normal.    ED Results / Procedures / Treatments   Labs (all labs ordered are listed, but only abnormal results are displayed) Labs Reviewed  RESP PANEL BY RT-PCR (FLU A&B, COVID) ARPGX2 - Abnormal; Notable for the following components:      Result Value   SARS Coronavirus 2 by RT PCR POSITIVE (*)    All other components within normal limits  BASIC METABOLIC PANEL - Abnormal; Notable for the following components:   Glucose, Bld 227 (*)    All other components within normal limits  CBC - Abnormal; Notable for the following components:   MCV 102.8 (*)    All other components within normal limits  TROPONIN I (HIGH SENSITIVITY)  TROPONIN I (HIGH SENSITIVITY)    EKG EKG  Interpretation  Date/Time:  Wednesday August 26 2021 09:08:50 EST Ventricular Rate:  93 PR Interval:  190 QRS Duration: 96 QT Interval:  384 QTC Calculation: 477 R Axis:   -27 Text Interpretation: Normal sinus rhythm Normal ECG When compared with ECG of 26-Aug-2018 00:56, No significant change since No significant change since last tracing Confirmed by Madalyn Rob [phone]) on 08/26/2021 12:53:42 PM  Radiology DG Chest 2 View  Result Date: 08/26/2021 CLINICAL DATA:  61 year old male with history of cough and chest pain. EXAM: CHEST -  2 VIEW COMPARISON:  Chest x-ray 06/03/2020. FINDINGS: Lung volumes are normal. No consolidative airspace disease. No pleural effusions. No pneumothorax. Prominence of extrapleural fat, similar to numerous prior examinations, better demonstrated on prior chest CT 08/05/2016. No pulmonary nodule or mass noted. Pulmonary vasculature and the cardiomediastinal silhouette are within normal limits. Atherosclerosis in the thoracic aorta. IMPRESSION: 1.  No radiographic evidence of acute cardiopulmonary disease. 2. Aortic atherosclerosis. Electronically Signed   By: Vinnie Langton M.D.   On: 08/26/2021 09:27    Procedures Procedures    Medications Ordered in ED Medications - No data to display  ED Course/ Medical Decision Making/ A&P                           Medical Decision Making Amount and/or Complexity of Data Reviewed Labs: ordered. Radiology: ordered.   61 year old male presenting to ER with concern for cough.  On exam he appears well in no distress.  He had endorsed chest pain to triage RN but denied any chest pain when I was interviewing him.  His EKG does not have any ischemic change and his troponin is within normal limits and given no ongoing pain, doubt ACS.  CXR was reviewed, I independently reviewed images.  No focal infiltrate identified to suggest pneumonia.  Basic lab work is grossly stable.  No anemia or electrolyte derangement.  His COVID test  is positive.  Suspect this is culprit for his symptoms today.  Given patient's extensive list of past medical history including severe obesity, heart failure, diabetes, feel he would benefit from Paxlovid.  Reviewed medication list, instructed to hold Crestor while taking new medication.  Advised follow-up with primary doctor for recheck.  Reviewed return precautions.  Given patient has no hypoxia, no tachypnea or difficulty in breathing, do not feel he requires admission today and can be managed in the outpatient setting.  [name] was evaluated in Emergency Department on 08/26/2021 for the symptoms described in the history of present illness. He was evaluated in the context of the global COVID-19 pandemic, which necessitated consideration that the patient might be at risk for infection with the SARS-CoV-2 virus that causes COVID-19. Institutional protocols and algorithms that pertain to the evaluation of patients at risk for COVID-19 are in a state of rapid change based on information released by regulatory bodies including the CDC and federal and state organizations. These policies and algorithms were followed during the patient's care in the ED.    After the discussed management above, the patient was determined to be safe for discharge.  The patient was in agreement with this plan and all questions regarding their care were answered.  ED return precautions were discussed and the patient will return to the ED with any significant worsening of condition.         Final Clinical Impression(s) / ED Diagnoses Final diagnoses:  Acute cough  COVID-19    Rx / DC Orders ED Discharge Orders          Ordered    nirmatrelvir/ritonavir EUA (PAXLOVID) 20 x 150 MG & 10 x '100MG'$  TABS  2 times daily        08/26/21 1307              Lucrezia Starch, MD 08/26/21 1311

## 2021-12-19 ENCOUNTER — Emergency Department (HOSPITAL_COMMUNITY): Payer: Medicare (Managed Care)

## 2021-12-19 ENCOUNTER — Emergency Department (HOSPITAL_BASED_OUTPATIENT_CLINIC_OR_DEPARTMENT_OTHER)
Admit: 2021-12-19 | Discharge: 2021-12-19 | Disposition: A | Discharge status: Home / Self Care | Payer: Medicare (Managed Care) | Attending: Emergency Medicine | Admitting: Emergency Medicine

## 2021-12-19 ENCOUNTER — Other Ambulatory Visit: Payer: Self-pay

## 2021-12-19 ENCOUNTER — Encounter (HOSPITAL_COMMUNITY): Payer: Self-pay

## 2021-12-19 ENCOUNTER — Inpatient Hospital Stay (HOSPITAL_COMMUNITY)
Admission: EM | Admit: 2021-12-19 | Discharge: 2021-12-22 | DRG: 291 | Disposition: A | Discharge status: Home / Self Care | Payer: Medicare (Managed Care) | Attending: Internal Medicine | Admitting: Internal Medicine

## 2021-12-19 DIAGNOSIS — R739 Hyperglycemia, unspecified: Secondary | ICD-10-CM

## 2021-12-19 DIAGNOSIS — R238 Other skin changes: Secondary | ICD-10-CM | POA: Diagnosis present

## 2021-12-19 DIAGNOSIS — N189 Chronic kidney disease, unspecified: Secondary | ICD-10-CM | POA: Diagnosis present

## 2021-12-19 DIAGNOSIS — Z8249 Family history of ischemic heart disease and other diseases of the circulatory system: Secondary | ICD-10-CM

## 2021-12-19 DIAGNOSIS — I5032 Chronic diastolic (congestive) heart failure: Secondary | ICD-10-CM | POA: Insufficient documentation

## 2021-12-19 DIAGNOSIS — E785 Hyperlipidemia, unspecified: Secondary | ICD-10-CM | POA: Diagnosis present

## 2021-12-19 DIAGNOSIS — L03116 Cellulitis of left lower limb: Secondary | ICD-10-CM | POA: Diagnosis not present

## 2021-12-19 DIAGNOSIS — I509 Heart failure, unspecified: Secondary | ICD-10-CM

## 2021-12-19 DIAGNOSIS — E7849 Other hyperlipidemia: Secondary | ICD-10-CM | POA: Diagnosis not present

## 2021-12-19 DIAGNOSIS — Z79890 Hormone replacement therapy (postmenopausal): Secondary | ICD-10-CM

## 2021-12-19 DIAGNOSIS — R609 Edema, unspecified: Secondary | ICD-10-CM

## 2021-12-19 DIAGNOSIS — I10 Essential (primary) hypertension: Secondary | ICD-10-CM | POA: Diagnosis present

## 2021-12-19 DIAGNOSIS — Z7984 Long term (current) use of oral hypoglycemic drugs: Secondary | ICD-10-CM

## 2021-12-19 DIAGNOSIS — Z87891 Personal history of nicotine dependence: Secondary | ICD-10-CM

## 2021-12-19 DIAGNOSIS — E1122 Type 2 diabetes mellitus with diabetic chronic kidney disease: Secondary | ICD-10-CM | POA: Diagnosis present

## 2021-12-19 DIAGNOSIS — L0390 Cellulitis, unspecified: Secondary | ICD-10-CM

## 2021-12-19 DIAGNOSIS — R600 Localized edema: Secondary | ICD-10-CM

## 2021-12-19 DIAGNOSIS — Z6841 Body Mass Index (BMI) 40.0 and over, adult: Secondary | ICD-10-CM

## 2021-12-19 DIAGNOSIS — L03115 Cellulitis of right lower limb: Secondary | ICD-10-CM | POA: Diagnosis present

## 2021-12-19 DIAGNOSIS — I130 Hypertensive heart and chronic kidney disease with heart failure and stage 1 through stage 4 chronic kidney disease, or unspecified chronic kidney disease: Secondary | ICD-10-CM | POA: Diagnosis not present

## 2021-12-19 DIAGNOSIS — N179 Acute kidney failure, unspecified: Secondary | ICD-10-CM

## 2021-12-19 DIAGNOSIS — M19041 Primary osteoarthritis, right hand: Secondary | ICD-10-CM | POA: Diagnosis present

## 2021-12-19 DIAGNOSIS — E1165 Type 2 diabetes mellitus with hyperglycemia: Secondary | ICD-10-CM | POA: Diagnosis present

## 2021-12-19 DIAGNOSIS — Z86711 Personal history of pulmonary embolism: Secondary | ICD-10-CM

## 2021-12-19 DIAGNOSIS — M19042 Primary osteoarthritis, left hand: Secondary | ICD-10-CM | POA: Diagnosis present

## 2021-12-19 DIAGNOSIS — I5031 Acute diastolic (congestive) heart failure: Secondary | ICD-10-CM | POA: Diagnosis not present

## 2021-12-19 DIAGNOSIS — J4540 Moderate persistent asthma, uncomplicated: Secondary | ICD-10-CM | POA: Diagnosis present

## 2021-12-19 DIAGNOSIS — E119 Type 2 diabetes mellitus without complications: Secondary | ICD-10-CM

## 2021-12-19 DIAGNOSIS — Z79899 Other long term (current) drug therapy: Secondary | ICD-10-CM

## 2021-12-19 DIAGNOSIS — M7989 Other specified soft tissue disorders: Secondary | ICD-10-CM | POA: Diagnosis not present

## 2021-12-19 DIAGNOSIS — Z86718 Personal history of other venous thrombosis and embolism: Secondary | ICD-10-CM

## 2021-12-19 DIAGNOSIS — M109 Gout, unspecified: Secondary | ICD-10-CM | POA: Diagnosis present

## 2021-12-19 DIAGNOSIS — R0602 Shortness of breath: Secondary | ICD-10-CM

## 2021-12-19 DIAGNOSIS — I5033 Acute on chronic diastolic (congestive) heart failure: Secondary | ICD-10-CM | POA: Diagnosis present

## 2021-12-19 DIAGNOSIS — M170 Bilateral primary osteoarthritis of knee: Secondary | ICD-10-CM | POA: Diagnosis present

## 2021-12-19 DIAGNOSIS — Z888 Allergy status to other drugs, medicaments and biological substances status: Secondary | ICD-10-CM

## 2021-12-19 LAB — CBC WITH DIFFERENTIAL/PLATELET
Abs Immature Granulocytes: 0.03 10*3/uL (ref 0.00–0.07)
Basophils Absolute: 0 10*3/uL (ref 0.0–0.1)
Basophils Relative: 0 %
Eosinophils Absolute: 0.2 10*3/uL (ref 0.0–0.5)
Eosinophils Relative: 3 %
HCT: 43.2 % (ref 39.0–52.0)
Hemoglobin: 14.5 g/dL (ref 13.0–17.0)
Immature Granulocytes: 0 %
Lymphocytes Relative: 33 %
Lymphs Abs: 2.5 10*3/uL (ref 0.7–4.0)
MCH: 34 pg (ref 26.0–34.0)
MCHC: 33.6 g/dL (ref 30.0–36.0)
MCV: 101.2 fL — ABNORMAL HIGH (ref 80.0–100.0)
Monocytes Absolute: 0.6 10*3/uL (ref 0.1–1.0)
Monocytes Relative: 7 %
Neutro Abs: 4.3 10*3/uL (ref 1.7–7.7)
Neutrophils Relative %: 57 %
Platelets: 224 10*3/uL (ref 150–400)
RBC: 4.27 MIL/uL (ref 4.22–5.81)
RDW: 14.6 % (ref 11.5–15.5)
WBC: 7.5 10*3/uL (ref 4.0–10.5)
nRBC: 0 % (ref 0.0–0.2)

## 2021-12-19 LAB — COMPREHENSIVE METABOLIC PANEL
ALT: 43 U/L (ref 0–44)
AST: 50 U/L — ABNORMAL HIGH (ref 15–41)
Albumin: 3.5 g/dL (ref 3.5–5.0)
Alkaline Phosphatase: 53 U/L (ref 38–126)
Anion gap: 12 (ref 5–15)
BUN: 27 mg/dL — ABNORMAL HIGH (ref 8–23)
CO2: 26 mmol/L (ref 22–32)
Calcium: 9.1 mg/dL (ref 8.9–10.3)
Chloride: 97 mmol/L — ABNORMAL LOW (ref 98–111)
Creatinine, Ser: 2.02 mg/dL — ABNORMAL HIGH (ref 0.61–1.24)
GFR, Estimated: 37 mL/min — ABNORMAL LOW (ref >60)
Glucose, Bld: 145 mg/dL — ABNORMAL HIGH (ref 70–99)
Potassium: 3.9 mmol/L (ref 3.5–5.1)
Sodium: 135 mmol/L (ref 135–145)
Total Bilirubin: 1.2 mg/dL (ref 0.3–1.2)
Total Protein: 7.5 g/dL (ref 6.5–8.1)

## 2021-12-19 LAB — URINALYSIS, ROUTINE W REFLEX MICROSCOPIC
Bilirubin Urine: NEGATIVE
Glucose, UA: >=500 mg/dL — AB
Hgb urine dipstick: NEGATIVE
Ketones, ur: NEGATIVE mg/dL
Leukocytes,Ua: NEGATIVE
Nitrite: NEGATIVE
Protein, ur: NEGATIVE mg/dL
Specific Gravity, Urine: 1.037 — ABNORMAL HIGH (ref 1.005–1.030)
pH: 5 (ref 5.0–8.0)

## 2021-12-19 LAB — BRAIN NATRIURETIC PEPTIDE: B Natriuretic Peptide: 11 pg/mL (ref 0.0–100.0)

## 2021-12-19 LAB — TROPONIN I (HIGH SENSITIVITY)
Troponin I (High Sensitivity): 8 ng/L (ref <18)
Troponin I (High Sensitivity): 8 ng/L (ref <18)

## 2021-12-19 MED ORDER — IOHEXOL 350 MG/ML SOLN
80.0000 mL | Freq: Once | INTRAVENOUS | Status: AC | PRN
  Administered 2021-12-19: 80 mL via INTRAVENOUS

## 2021-12-19 MED ORDER — ALLOPURINOL 300 MG PO TABS
300.0000 mg | ORAL_TABLET | Freq: Two times a day (BID) | ORAL | Status: DC
  Administered 2021-12-19 – 2021-12-22 (×6): 300 mg via ORAL
  Filled 2021-12-19 (×2): qty 1
  Filled 2021-12-19: qty 3
  Filled 2021-12-19 (×3): qty 1

## 2021-12-19 MED ORDER — FENOFIBRATE 160 MG PO TABS
160.0000 mg | ORAL_TABLET | Freq: Every day | ORAL | Status: DC
  Administered 2021-12-20 – 2021-12-22 (×4): 160 mg via ORAL
  Filled 2021-12-19 (×5): qty 1

## 2021-12-19 MED ORDER — FUROSEMIDE 10 MG/ML IJ SOLN
40.0000 mg | Freq: Every day | INTRAMUSCULAR | Status: DC
  Administered 2021-12-19 – 2021-12-22 (×4): 40 mg via INTRAVENOUS
  Filled 2021-12-19 (×4): qty 4

## 2021-12-19 MED ORDER — ATORVASTATIN CALCIUM 80 MG PO TABS
80.0000 mg | ORAL_TABLET | Freq: Every day | ORAL | Status: DC
  Administered 2021-12-19 – 2021-12-22 (×4): 80 mg via ORAL
  Filled 2021-12-19 (×4): qty 1

## 2021-12-19 MED ORDER — ENOXAPARIN SODIUM 100 MG/ML IJ SOSY
100.0000 mg | PREFILLED_SYRINGE | INTRAMUSCULAR | Status: DC
  Administered 2021-12-20 – 2021-12-21 (×3): 100 mg via SUBCUTANEOUS
  Filled 2021-12-19 (×4): qty 1

## 2021-12-19 MED ORDER — LEVOTHYROXINE SODIUM 112 MCG PO TABS
112.0000 ug | ORAL_TABLET | Freq: Every day | ORAL | Status: DC
  Administered 2021-12-20 – 2021-12-22 (×3): 112 ug via ORAL
  Filled 2021-12-19 (×3): qty 1

## 2021-12-19 MED ORDER — SODIUM CHLORIDE 0.9 % IV SOLN
2.0000 g | INTRAVENOUS | Status: DC
  Administered 2021-12-20 – 2021-12-21 (×3): 2 g via INTRAVENOUS
  Filled 2021-12-19 (×3): qty 20

## 2021-12-19 MED ORDER — CEFAZOLIN SODIUM-DEXTROSE 2-4 GM/100ML-% IV SOLN
2.0000 g | Freq: Once | INTRAVENOUS | Status: AC
  Administered 2021-12-19: 2 g via INTRAVENOUS
  Filled 2021-12-19: qty 100

## 2021-12-19 MED ORDER — COLCHICINE 0.6 MG PO TABS
0.6000 mg | ORAL_TABLET | Freq: Two times a day (BID) | ORAL | Status: DC
  Administered 2021-12-20: 0.6 mg via ORAL
  Filled 2021-12-19 (×3): qty 1

## 2021-12-19 MED ORDER — INSULIN ASPART 100 UNIT/ML IJ SOLN
0.0000 [IU] | Freq: Three times a day (TID) | INTRAMUSCULAR | Status: DC
  Administered 2021-12-20: 2 [IU] via SUBCUTANEOUS
  Administered 2021-12-20: 3 [IU] via SUBCUTANEOUS
  Administered 2021-12-20: 1 [IU] via SUBCUTANEOUS
  Administered 2021-12-21 – 2021-12-22 (×6): 2 [IU] via SUBCUTANEOUS

## 2021-12-19 NOTE — ED Provider Notes (Signed)
[facility] Provider Note   CSN: [id number] Arrival date & time: 12/19/21  1148     History  Chief Complaint  Patient presents with   Shortness of Breath    Ronnie Hernandez is a 61 y.o. male.  The history is provided by the patient and medical records. No language interpreter was used.  Shortness of Breath Severity:  Moderate Onset quality:  Gradual Duration:  1 week Timing:  Constant Progression:  Worsening Chronicity:  New Relieved by:  Nothing Worsened by:  Nothing Ineffective treatments:  None tried Associated symptoms: no abdominal pain, no chest pain, no cough, no fever, no headaches, no sputum production, no vomiting and no wheezing   Risk factors: hx of PE/DVT        Home Medications Prior to Admission medications   Medication Sig Start Date End Date Taking? Authorizing Provider  albuterol (VENTOLIN HFA) 108 (90 Base) MCG/ACT inhaler Inhale 1-2 puffs into the lungs every 6 (six) hours as needed for wheezing or shortness of breath.    [provider]  allopurinol (ZYLOPRIM) 300 MG tablet Take 1 tablet (300 mg total) by mouth 2 (two) times daily. 08/27/19   [name], NP  ascorbic acid (VITAMIN C) 500 MG tablet Take 500 mg by mouth daily.    [provider]  Cholecalciferol (VITAMIN D3) 50 MCG (2000 UT) CAPS Take 2,000 Units by mouth daily.    [provider]  colchicine 0.6 MG tablet Take 1 tablet (0.6 mg total) by mouth daily. 04/04/18   Shelly Coss, MD  fluticasone (FLONASE) 50 MCG/ACT nasal spray Place 1 spray into both nostrils daily as needed for allergies or rhinitis.    [provider]  furosemide (LASIX) 40 MG tablet Take one tablet by mouth once daily Patient taking differently: Take 40-80 mg by mouth daily as needed for fluid. 07/25/19   [name], NP  ibuprofen (ADVIL) 200 MG tablet Take 400 mg by mouth every 6 (six) hours as needed for fever or headache.     [provider]  levothyroxine (SYNTHROID) 100 MCG tablet Take 100 mcg by mouth daily before breakfast.    [provider]  lisinopril (ZESTRIL) 2.5 MG tablet TAKE 1 TABLET BY MOUTH EVERY DAY Patient taking differently: Take 2.5 mg by mouth daily. 04/30/19   [name], NP  metFORMIN (GLUCOPHAGE) 500 MG tablet Take 500 mg by mouth 2 (two) times daily with a meal.    [provider]  Multiple Vitamin (MULTIVITAMIN WITH MINERALS) TABS tablet Take 1 tablet by mouth daily. One a Day Men's    [provider]  nystatin (MYCOSTATIN/NYSTOP) powder Apply 1 application topically 3 (three) times daily. Patient taking differently: Apply 1 application topically 2 (two) times daily. 08/24/19   [name], NP  rosuvastatin (CRESTOR) 20 MG tablet Take 20 mg by mouth daily.    [provider]  UNABLE TO FIND bariatric drop arm commode Dx) R54, E66.01, I50.9, M19.9 07/09/19   [name], NP  UNABLE TO FIND Bariatric tub transfer bench Dx) R54, E66.01, I50.9, M19.9 07/09/19   [name], NP  UNABLE TO FIND Bariatric lift chair. Dx) R54, E66.01, I50.9, M19.9 07/09/19   [name], NP  UNABLE TO FIND Heavy duty Cane Dx) R54, E66.01, I50.9, M19.9 07/09/19   [name], NP      Allergies    Uloric [febuxostat]    Review of Systems  Review of Systems  Constitutional:  Positive for fatigue. Negative for chills and fever.  HENT:  Negative for congestion.   Respiratory:  Positive for shortness of breath. Negative for cough, sputum production, chest tightness and wheezing.   Cardiovascular:  Positive for leg swelling. Negative for chest pain and palpitations.  Gastrointestinal:  Negative for abdominal pain, constipation, diarrhea, nausea and vomiting.  Genitourinary:  Positive for decreased urine volume.  Musculoskeletal:  Negative for back pain.  Neurological:  Negative for light-headedness and headaches.  Psychiatric/Behavioral:   Negative for agitation.   All other systems reviewed and are negative.   Physical Exam Updated Vital Signs BP (!) 143/85   Pulse (!) 105   Temp 97.6 F (36.4 C) (Oral)   Resp 18   Ht '5\' 6"'$  (1.676 m)   Wt (!) 204.1 kg   SpO2 98%   BMI 72.63 kg/m  Physical Exam Vitals and nursing note reviewed.  Constitutional:      General: He is not in acute distress.    Appearance: He is well-developed. He is not ill-appearing, toxic-appearing or diaphoretic.  HENT:     Head: Normocephalic and atraumatic.  Eyes:     Extraocular Movements: Extraocular movements intact.     Conjunctiva/sclera: Conjunctivae normal.     Pupils: Pupils are equal, round, and reactive to light.  Cardiovascular:     Rate and Rhythm: Normal rate and regular rhythm.     Heart sounds: No murmur heard. Pulmonary:     Effort: Pulmonary effort is normal. No tachypnea or respiratory distress.     Breath sounds: Rales present. No wheezing or rhonchi.  Chest:     Chest wall: No tenderness.  Abdominal:     Palpations: Abdomen is soft.     Tenderness: There is no abdominal tenderness.  Musculoskeletal:        General: No swelling.     Cervical back: Neck supple.     Right lower leg: Tenderness present. Edema present.     Left lower leg: Tenderness present. Edema present.     Comments: Bilateral legs are edematous, blistering, erythematous, and swollen.  Concern for cellulitis present.   Skin:    General: Skin is warm and dry.     Capillary Refill: Capillary refill takes less than 2 seconds.     Findings: Erythema present.  Neurological:     General: No focal deficit present.     Mental Status: He is alert.  Psychiatric:        Mood and Affect: Mood normal.     ED Results / Procedures / Treatments   Labs (all labs ordered are listed, but only abnormal results are displayed) Labs Reviewed  CBC WITH DIFFERENTIAL/PLATELET - Abnormal; Notable for the following components:      Result Value   MCV 101.2 (*)     All other components within normal limits  COMPREHENSIVE METABOLIC PANEL - Abnormal; Notable for the following components:   Chloride 97 (*)    Glucose, Bld 145 (*)    BUN 27 (*)    Creatinine, Ser 2.02 (*)    AST 50 (*)    GFR, Estimated 37 (*)    All other components within normal limits  CULTURE, BLOOD (ROUTINE X 2)  CULTURE, BLOOD (ROUTINE X 2)  URINE CULTURE  BRAIN NATRIURETIC PEPTIDE  LACTIC ACID, PLASMA  LACTIC ACID, PLASMA  URINALYSIS, ROUTINE W REFLEX MICROSCOPIC  TROPONIN I (HIGH SENSITIVITY)  TROPONIN I (HIGH SENSITIVITY)    EKG EKG  Interpretation  Date/Time:  Saturday December 19 2021 11:55:30 EDT Ventricular Rate:  105 PR Interval:  208 QRS Duration: 98 QT Interval:  358 QTC Calculation: 474 R Axis:   -45 Text Interpretation: Sinus tachycardia Borderline prolonged PR interval Left anterior fascicular block Anterior infarct, old when compared to prior, similar appearance. No STEMI Confirmed by Antony Blackbird [phone]) on 12/19/2021 12:27:01 PM  Radiology CT Angio Chest PE W and/or Wo Contrast  Result Date: 12/19/2021 CLINICAL DATA:  Pulmonary embolism (PE) suspected, high prob Pleuritic chest pain and shortness of breath, history of PE not on blood thinners. Feels similar. Also edema in legs concerning for CHF worsening EXAM: CT ANGIOGRAPHY CHEST WITH CONTRAST TECHNIQUE: Multidetector CT imaging of the chest was performed using the standard protocol during bolus administration of intravenous contrast. Multiplanar CT image reconstructions and MIPs were obtained to evaluate the vascular anatomy. RADIATION DOSE REDUCTION: This exam was performed according to the departmental dose-optimization program which includes automated exposure control, adjustment of the mA and/or kV according to patient size and/or use of iterative reconstruction technique. CONTRAST:  110m OMNIPAQUE IOHEXOL 350 MG/ML SOLN COMPARISON:  CT dated August 05, 2026 FINDINGS: Cardiovascular: Heart is the mildly  enlarged. No acute pulmonary embolism through the proximal segmental pulmonary arteries. Evaluation of portions of the lungs are limited due to respiratory motion. No pericardial effusion. Mild LEFT-sided coronary artery atherosclerotic calcifications. Scattered atherosclerotic calcifications of the nonaneurysmal thoracic aorta. Mediastinum/Nodes: No axillary or mediastinal adenopathy. Visualized thyroid is unremarkable. Lungs/Pleura: No pleural effusion or pneumothorax. Mild bibasilar atelectasis. Scattered ground-glass opacities in the RIGHT upper lobe, likely infectious or inflammatory in etiology. Upper Abdomen: Morphologic changes of liver with widening of the fissure and suggestion of nodular contours. Peripheral calcifications of the gallbladder at the edge of the field of view. Musculoskeletal: Gynecomastia. Flowing anterior osteophytes with relative preservation of the disc space most consistent with underlying diffuse idiopathic skeletal hyperostosis. Review of the MIP images confirms the above findings. IMPRESSION: 1. No acute pulmonary embolism. 2. Morphologic changes of the liver could reflect underlying cirrhosis. 3. Peripheral calcifications of the gallbladder which could reflect porcelain gallbladder. 4. Scattered RIGHT upper lobe ground-glass opacities, likely infectious or inflammatory in etiology. Aortic Atherosclerosis (ICD10-I70.0). Electronically Signed   By: SValentino SaxonM.D.   On: 12/19/2021 15:42   VAS [place]OWER EXTREMITY VENOUS (DVT) (ONLY [facility] & [facility])  Result Date: 12/19/2021  Lower Venous DVT Study Patient Name:  Ronnie Hernandez Date of Exam:   12/19/2021 Medical Rec #: [id number]         Accession #:    [id number] of Birth: 1Jun 06, 1962        Patient Gender: M Patient Age:   89years [facility]      [facility] [place]OWER EXTREMITY VENOUS (DVT) Referring Phys: CMarda Stalker --------------------------------------------------------------------------------  Indications: Swelling.  Risk Factors: None identified. Limitations: Body habitus, poor ultrasound/tissue interface and patient positioning, patient immobility. Comparison Study: No prior studies. Performing Technologist: GOliver HumRVT  Examination Guidelines: A complete evaluation includes B-mode imaging, spectral Doppler, color Doppler, and power Doppler as needed of all accessible portions of each vessel. Bilateral testing is considered an integral part of a complete examination. Limited examinations for reoccurring indications may be performed as noted. The reflux portion of the exam is performed with the patient in reverse Trendelenburg.  +---------+---------------+---------+-----------+----------+-------------------+ RIGHT    CompressibilityPhasicitySpontaneityPropertiesThrombus Aging      +---------+---------------+---------+-----------+----------+-------------------+ CFV  Yes      Yes                                      +---------+---------------+---------+-----------+----------+-------------------+ SFJ                                                   Not well visualized +---------+---------------+---------+-----------+----------+-------------------+ FV Prox                 Yes      Yes                                      +---------+---------------+---------+-----------+----------+-------------------+ FV Mid                  Yes      Yes                                      +---------+---------------+---------+-----------+----------+-------------------+ FV Distal               Yes      Yes                                      +---------+---------------+---------+-----------+----------+-------------------+ PFV                                                   Not well visualized  +---------+---------------+---------+-----------+----------+-------------------+ POP                     Yes      Yes                                      +---------+---------------+---------+-----------+----------+-------------------+ PTV      Full                                                             +---------+---------------+---------+-----------+----------+-------------------+ PERO                                                  Not well visualized +---------+---------------+---------+-----------+----------+-------------------+   +---------+---------------+---------+-----------+----------+-------------------+ LEFT     CompressibilityPhasicitySpontaneityPropertiesThrombus Aging      +---------+---------------+---------+-----------+----------+-------------------+ CFV                     Yes      Yes                                      +---------+---------------+---------+-----------+----------+-------------------+  SFJ                                                   Not well visualized +---------+---------------+---------+-----------+----------+-------------------+ FV Prox                 Yes      Yes                                      +---------+---------------+---------+-----------+----------+-------------------+ FV Mid                  Yes      Yes                                      +---------+---------------+---------+-----------+----------+-------------------+ FV Distal                                             Not well visualized +---------+---------------+---------+-----------+----------+-------------------+ PFV                                                   Not well visualized +---------+---------------+---------+-----------+----------+-------------------+ POP                     Yes      Yes                                      +---------+---------------+---------+-----------+----------+-------------------+  PTV      Full                                                             +---------+---------------+---------+-----------+----------+-------------------+ PERO                                                  Not well visualized +---------+---------------+---------+-----------+----------+-------------------+     Summary: RIGHT: - There is no evidence of deep vein thrombosis in the lower extremity. However, portions of this examination were limited- see technologist comments above.  - No cystic structure found in the popliteal fossa.  LEFT: - There is no evidence of deep vein thrombosis in the lower extremity. However, portions of this examination were limited- see technologist comments above.  - No cystic structure found in the popliteal fossa.  *See table(s) above for measurements and observations.    Preliminary     Procedures Procedures    CRITICAL CARE Performed by: Gwenyth Allegra Akemi Overholser Total critical care time: 35 minutes Critical care time was exclusive of separately billable procedures and treating other patients. Critical  care was necessary to treat or prevent imminent or life-threatening deterioration. Critical care was time spent personally by me on the following activities: development of treatment plan with patient and/or surrogate as well as nursing, discussions with consultants, evaluation of patient's response to treatment, examination of patient, obtaining history from patient or surrogate, ordering and performing treatments and interventions, ordering and review of laboratory studies, ordering and review of radiographic studies, pulse oximetry and re-evaluation of patient's condition.   Medications Ordered in ED Medications  ceFAZolin (ANCEF) IVPB 2g/100 mL premix (has no administration in time range)  iohexol (OMNIPAQUE) 350 MG/ML injection 80 mL (80 mLs Intravenous Contrast Given 12/19/21 1522)    ED Course/ Medical Decision Making/ A&P                            Medical Decision Making Amount and/or Complexity of Data Reviewed Labs: ordered. Radiology: ordered.  Risk Prescription drug management. Decision regarding hospitalization.    ZARION OLIFF is a 61 y.o. male with a past medical history significant for diabetes, CHF, previous pulmonary embolism not on anticoagulation, hypertension, hyperlipidemia, asthma, gout, and obesity who presents with 1 week of worsening fatigue, exertional shortness of breath and chest discomfort, pleuritic chest discomfort, bilateral leg edema, blistering, redness, warmth, and swelling.  According to patient, this feels similar to when he had admitted for fluid overload in the past.  He also says that he has having shortness of breath and chest discomfort whenever he ambulates or takes deep breaths.  He is unsure if this feels similar to previous PE but he thinks it could be.  He is not on blood thinners.  He denies trauma.  He reports no significant fevers, chills, congestion, or cough.  He denies nausea, vomiting, constipation, or diarrhea but does think his urine has been decreased.  He says that he has been taking his diuretics but they do not seem to be working anymore and he is starting to gain weight very fast in his legs bilaterally.  He reports his legs are hurting and he has had some bilateral blistering started to form with redness and warmth on both sides.  He reports this is what happened before he had to admitted for fluid overload in the past.  He denies other complaints at this time.  On arrival he was found to be tachycardic.  Patient also reports that his chair that he usually uses to lift his legs up has been broken and would not be fixed till at least next week after fourth July.  He reports that due to this he has not been able to see his legs for the last few days and has not been able to lift them.  He has been keeping them in a dependent fashion in a chair as he is having difficulty laying flat due  to the shortness of breath.  Unclear if this worsened edema to legs is also contributed to his inability to raise them.  On my exam, lungs did have some rales but no significant wheezing or rhonchi.  Chest and abdomen were nontender.  No murmur.  He does have pulses in extremities however legs are extremely swollen bilaterally.  He has blistering in both legs and some warmth and erythema concerning for either blistering from significant edema versus cellulitis.  Exam otherwise unremarkable.  EKG does not show STEMI.  Clinically I am concerned that patient is either having worsened fluid overload leading to  this swelling and blistering and after skin injury from popping blister possible cellulitis versus new DVT/PE given his chest symptoms.  With his new shortness of breath and worsening edema I anticipate he will likely admission for diuresis and monitoring however we will try to get a CT PE study to rule out a clot.  We will also get screening labs and get bilateral leg ultrasounds if possible.  If there is no evidence of thromboembolism, I suspect this is more of a fluid overload problem with possible early cellulitis.  Anticipate admission after work-up.  3:47 PM  CT PE study does not show evidence of pulmonary embolism but does show evidence of inflammation or infection.  I asked the patient again if he has any cough and he is denying but does describe shortness of breath.  His labs have also returned showing evidence of acute kidney injury.  I suspect that this AKI is not allowing his diuresis to keep up with his fluid and this is leading to the swelling and the bursting wounds and likely new cellulitis in the legs.  We will give antibiotics for the cellulitis and he will need admission for renal monitoring and diuresis and antibiotics         Final Clinical Impression(s) / ED Diagnoses Final diagnoses:  AKI (acute kidney injury) ([place])  Edema, unspecified type  SOB (shortness of  breath)  Cellulitis, unspecified cellulitis site     Clinical Impression: 1. AKI (acute kidney injury) ([place])   2. Edema, unspecified type   3. SOB (shortness of breath)   4. Cellulitis, unspecified cellulitis site     Disposition: Admit  This note was prepared with assistance of Dragon voice recognition software. Occasional wrong-word or sound-a-like substitutions may have occurred due to the inherent limitations of voice recognition software.     Fatime Biswell, Gwenyth Allegra, MD 12/19/21 [phone]

## 2021-12-19 NOTE — ED Triage Notes (Signed)
Started last Thursday with SOB that has increased and states he is retaining fluid in legs with blisters former last time was here the fluid went to the lungs and they said to come sooner next time so he is here now to get the fluid off sooner rather then later

## 2021-12-19 NOTE — Assessment & Plan Note (Deleted)
Treated with bilaterally cellulitis in the ED but suspect this is just worsening chronic/dependent edema with venous stasis changes. Will discontinue IV antibiotics.  -will need to hold on Lasix for now due to decrease renal function and AKI

## 2021-12-19 NOTE — Assessment & Plan Note (Signed)
BMI of 72

## 2021-12-19 NOTE — H&P (Signed)
History and Physical    Patient: Ronnie Hernandez KDX:[id number] DOB: [birth year] DOA: 12/19/2021 DOS: the patient was seen and examined on 12/19/2021 PCP: [facility], [place]  Patient coming from: Home  Chief Complaint:  Chief Complaint  Patient presents with   Shortness of Breath   HPI: Ronnie Hernandez is a 61 y.o. male with medical history significant of HTN, PE, HFpEF, moderate persistent asthma, T2DM, HLD, Gout, morbid obesity who presents with worsening LE edema and dyspnea on exertion.  He has been noticing increase LE edema and formation of blisters since he lounge chair broke about 5 days ago and has been able to prop his legs up. Has been more red. Also more short of breath with exertion and slight pain to top left chest. No cough. No fever. Has been taking Lasix at home but feels he is urinating less than usual.   In the ED, he was afebrile tachycardic and initially mildly hypertensive on room air. No leukocytosis or anemia.  Has AKI with creatinine of 2.02 from a prior of 1.11.  CBG of 145. UA is negative for leukocyte and nitrite but had greater than 500 glucose. CTA chest showed no acute pulmonary embolism, scattered right upper lobe groundglass opacity likely infectious or inflammatory.  Peripheral calcification of the gallbladder could be porcelain gallbladder.  Possibly underlying cirrhosis.  Negative bilateral DVT ultrasound.   He was started on IV Ancef in ED for persumed bilateral cellulitis.  Review of Systems: As mentioned in the history of present illness. All other systems reviewed and are negative. Past Medical History:  Diagnosis Date   Arthritis    "knees, hands" (04/19/2017)   Cellulitis 05/2020   bilateral legs   Childhood asthma    Diabetes type 2, uncontrolled    Gout    "have had it in both feet, both hands, right shoulder, back" (04/19/2017)   Hyperlipidemia    Hypertension    Morbidly obese ([place])    Pulmonary embolism  ([place]) 2017?   Past Surgical History:  Procedure Laterality Date   COLONOSCOPY W/ BIOPSIES AND POLYPECTOMY  07/2011   Archie Endo 07/27/2011   Social History:  reports that he quit smoking about 39 years ago. His smoking use included cigarettes. He has never used smokeless tobacco. He reports that he does not currently use drugs. He reports that he does not drink alcohol.  Allergies  Allergen Reactions   Uloric [Febuxostat] Other (See Comments)    Made the skin on feet and legs "hurt"    Family History  Problem Relation Age of Onset   Colon cancer Maternal Grandfather 92   Heart attack Father    Stomach cancer Maternal Aunt 80    Prior to Admission medications   Medication Sig Start Date End Date Taking? Authorizing Provider  albuterol (VENTOLIN HFA) 108 (90 Base) MCG/ACT inhaler Inhale 1-2 puffs into the lungs every 6 (six) hours as needed for wheezing or shortness of breath.    [provider]  allopurinol (ZYLOPRIM) 300 MG tablet Take 1 tablet (300 mg total) by mouth 2 (two) times daily. 08/27/19   [name], NP  ascorbic acid (VITAMIN C) 500 MG tablet Take 500 mg by mouth daily.    [provider]  Cholecalciferol (VITAMIN D3) 50 MCG (2000 UT) CAPS Take 2,000 Units by mouth daily.    [provider]  colchicine 0.6 MG tablet Take 1 tablet (0.6 mg total) by mouth daily. 04/04/18   Adhikari,  Amrit, MD  fluticasone (FLONASE) 50 MCG/ACT nasal spray Place 1 spray into both nostrils daily as needed for allergies or rhinitis.    [provider]  furosemide (LASIX) 40 MG tablet Take one tablet by mouth once daily Patient taking differently: Take 40-80 mg by mouth daily as needed for fluid. 07/25/19   [name], NP  ibuprofen (ADVIL) 200 MG tablet Take 400 mg by mouth every 6 (six) hours as needed for fever or headache.    [provider]  levothyroxine (SYNTHROID) 100 MCG tablet Take 100 mcg by mouth daily before breakfast.    [provider]  lisinopril (ZESTRIL) 2.5 MG tablet TAKE 1 TABLET BY MOUTH EVERY DAY Patient taking differently: Take 2.5 mg by mouth daily. 04/30/19   [name], NP  metFORMIN (GLUCOPHAGE) 500 MG tablet Take 500 mg by mouth 2 (two) times daily with a meal.    [provider]  Multiple Vitamin (MULTIVITAMIN WITH MINERALS) TABS tablet Take 1 tablet by mouth daily. One a Day Men's    [provider]  nystatin (MYCOSTATIN/NYSTOP) powder Apply 1 application topically 3 (three) times daily. Patient taking differently: Apply 1 application topically 2 (two) times daily. 08/24/19   [name], NP  rosuvastatin (CRESTOR) 20 MG tablet Take 20 mg by mouth daily.    [provider]  UNABLE TO FIND bariatric drop arm commode Dx) R54, E66.01, I50.9, M19.9 07/09/19   [name], NP  UNABLE TO FIND Bariatric tub transfer bench Dx) R54, E66.01, I50.9, M19.9 07/09/19   [name], NP  UNABLE TO FIND Bariatric lift chair. Dx) R54, E66.01, I50.9, M19.9 07/09/19   [name], NP  UNABLE TO FIND Heavy duty Kasandra Knudsen Dx) R54, E66.01, I50.9, M19.9 07/09/19   [name], NP    Physical Exam: Vitals:   12/19/21 1215 12/19/21 1545 12/19/21 1615 12/19/21 1745  BP: 117/65 125/78 (!) 96/45 121/69  Pulse: 90 94 90 96  Resp: '12 17 18 20  '$ Temp:      TempSrc:      SpO2: 99% 99% 91% 100%  Weight:      Height:       Constitutional: NAD, calm, comfortable,morbidly obese male laying at approximately 30 degree incline in bed Eyes: lids and conjunctivae normal ENMT: Mucous membranes are moist. Neck: normal, supple Respiratory: clear to auscultation bilaterally, no wheezing, no crackles. Normal respiratory effort. No accessory muscle use.  Cardiovascular: Regular rate and rhythm, no murmurs / rubs / gallops. Large truncal LE with chronic venous stasis changes and darkened discoloration with +3 pitting edema bilaterally with increasing erythema to Left LE with one  lateral fluid filled blistered. Macerated skin to right LE with small area of sloughing of skin.  Abdomen: no tenderness,  Bowel sounds positive.  Musculoskeletal: no clubbing / cyanosis. PIP joint deformities of all fingers. Skin: no rashes, lesions, ulcers Neurologic: CN 2-12 grossly intact.  Psychiatric: Normal judgment and insight. Alert and oriented x 3. Normal mood. Data Reviewed:  See HPI  Assessment and Plan: * AKI (acute kidney injury) ([place]) -Creatinine on admit of 2.02 up from 1.11 with GFR of 37 -Monitor while on diuresis.   Acute diastolic CHF (congestive heart failure) ([place]) He is having increase LE edema and dyspnea but no over congestion or edema seen on CTA chest. Difficult to really assess his fluid status with his body habitus.  -will trial IV Lasix and follow intake and output, daily weights  Cellulitis of left lower extremity Treated in ED for bilateral LE cellulitis. Suspect more so on the left with more profound erythema. Although like more of his exam, difficult to tell with his morbid obesity and chronic lymphedema -switch to IV Rocephin  Type 2 diabetes mellitus ([place]) Sensitive SSI   Hypertension Hold ACE-I due to AKI. -pt should not be on both ACE-I and ARB which are on his med rec. Will need to d/c one before discharge.  Morbid obesity ([place]) BMI of 72  Hyperlipidemia Continue statin and fenofibrate      Advance Care Planning:   Code Status: Full Code   Consults: none  Family Communication: No family at bedside  Severity of Illness: The appropriate patient status for this patient is OBSERVATION. Observation status is judged to be reasonable and necessary in order to provide the required intensity of service to ensure the patient's safety. The patient's presenting symptoms, physical exam findings, and initial radiographic and laboratory data in the context of their medical condition is felt to place them at decreased risk for further clinical  deterioration. Furthermore, it is anticipated that the patient will be medically stable for discharge from the hospital within 2 midnights of admission.   Author: Orene Desanctis, DO 12/19/2021 9:40 PM  For on call review [email or web address].

## 2021-12-19 NOTE — Assessment & Plan Note (Signed)
Treated in ED for bilateral LE cellulitis. Suspect more so on the left with more profound erythema. Although like more of his exam, difficult to tell with his morbid obesity and chronic lymphedema -switch to IV Rocephin

## 2021-12-19 NOTE — Assessment & Plan Note (Deleted)
-  reports recent dyspnea with exertion but no overt pulmonary edema on congestive seen on CT. Suspect obesity hyperventilation syndrome with his morbid obesity.  -RUL opacity is seen on CTA chest but has no leukocyosis or cough. Check procalcitionin.

## 2021-12-19 NOTE — Assessment & Plan Note (Addendum)
-  Creatinine on admit of 2.02 up from 1.11 with GFR of 37 -Monitor while on diuresis.

## 2021-12-19 NOTE — Assessment & Plan Note (Addendum)
Hold ACE-I due to AKI. -pt should not be on both ACE-I and ARB which are on his med rec. Will need to d/c one before discharge.

## 2021-12-19 NOTE — Progress Notes (Signed)
Bilateral lower extremity venous duplex has been completed. Preliminary results can be found in CV Proc through chart review.  Results were given to Dr. Sherry Ruffing.  12/19/21 1:23 PM Ronnie Hernandez RVT

## 2021-12-19 NOTE — ED Notes (Signed)
Back from CT

## 2021-12-19 NOTE — Assessment & Plan Note (Signed)
- 

## 2021-12-19 NOTE — Assessment & Plan Note (Addendum)
He is having increase LE edema and dyspnea but no over congestion or edema seen on CTA chest. Difficult to really assess his fluid status with his body habitus.  -will trial IV Lasix and follow intake and output, daily weights

## 2021-12-19 NOTE — ED Notes (Signed)
Ambulated pt to the bathroom pt shuffles well with stand by assist

## 2021-12-19 NOTE — Assessment & Plan Note (Deleted)
He is having increase LE edema and dyspnea but no over congestion or edema seen on CTA chest. Difficult to really assess his fluid status with his body habitus. However, suspect LE edema is due to worsening dependent/chronic lymphedema and dyspnea more related to his morbid obesity.

## 2021-12-19 NOTE — Assessment & Plan Note (Signed)
Sensitive SSI

## 2021-12-19 NOTE — ED Notes (Signed)
Lab called to notify me that the blood culture tubes didn't have #1 and #2 on them so they couldn't use the aerobic one.  The first set was sent down in a completely different tube at a completely different time.  Lanny Hurst went to attempt to draw the labs at a completely different time without success.  I then went in again a different time and drew the second set.  They were drawn and sent in a different tube correctly minus the #1 and #2 at completely different times so they were mixed up in the lab and only one set could be processed according to lab technician

## 2021-12-20 DIAGNOSIS — E785 Hyperlipidemia, unspecified: Secondary | ICD-10-CM | POA: Diagnosis present

## 2021-12-20 DIAGNOSIS — N179 Acute kidney failure, unspecified: Secondary | ICD-10-CM | POA: Diagnosis present

## 2021-12-20 DIAGNOSIS — Z79899 Other long term (current) drug therapy: Secondary | ICD-10-CM | POA: Diagnosis not present

## 2021-12-20 DIAGNOSIS — Z86718 Personal history of other venous thrombosis and embolism: Secondary | ICD-10-CM | POA: Diagnosis not present

## 2021-12-20 DIAGNOSIS — E1165 Type 2 diabetes mellitus with hyperglycemia: Secondary | ICD-10-CM | POA: Diagnosis present

## 2021-12-20 DIAGNOSIS — M109 Gout, unspecified: Secondary | ICD-10-CM | POA: Diagnosis present

## 2021-12-20 DIAGNOSIS — Z87891 Personal history of nicotine dependence: Secondary | ICD-10-CM | POA: Diagnosis not present

## 2021-12-20 DIAGNOSIS — L03115 Cellulitis of right lower limb: Secondary | ICD-10-CM | POA: Diagnosis present

## 2021-12-20 DIAGNOSIS — Z86711 Personal history of pulmonary embolism: Secondary | ICD-10-CM | POA: Diagnosis not present

## 2021-12-20 DIAGNOSIS — N189 Chronic kidney disease, unspecified: Secondary | ICD-10-CM | POA: Diagnosis present

## 2021-12-20 DIAGNOSIS — M19042 Primary osteoarthritis, left hand: Secondary | ICD-10-CM | POA: Diagnosis present

## 2021-12-20 DIAGNOSIS — M19041 Primary osteoarthritis, right hand: Secondary | ICD-10-CM | POA: Diagnosis present

## 2021-12-20 DIAGNOSIS — I5033 Acute on chronic diastolic (congestive) heart failure: Secondary | ICD-10-CM | POA: Diagnosis present

## 2021-12-20 DIAGNOSIS — E1122 Type 2 diabetes mellitus with diabetic chronic kidney disease: Secondary | ICD-10-CM | POA: Diagnosis present

## 2021-12-20 DIAGNOSIS — I509 Heart failure, unspecified: Secondary | ICD-10-CM | POA: Insufficient documentation

## 2021-12-20 DIAGNOSIS — I130 Hypertensive heart and chronic kidney disease with heart failure and stage 1 through stage 4 chronic kidney disease, or unspecified chronic kidney disease: Secondary | ICD-10-CM | POA: Diagnosis present

## 2021-12-20 DIAGNOSIS — J4540 Moderate persistent asthma, uncomplicated: Secondary | ICD-10-CM | POA: Diagnosis present

## 2021-12-20 DIAGNOSIS — Z79890 Hormone replacement therapy (postmenopausal): Secondary | ICD-10-CM | POA: Diagnosis not present

## 2021-12-20 DIAGNOSIS — Z7984 Long term (current) use of oral hypoglycemic drugs: Secondary | ICD-10-CM | POA: Diagnosis not present

## 2021-12-20 DIAGNOSIS — M170 Bilateral primary osteoarthritis of knee: Secondary | ICD-10-CM | POA: Diagnosis present

## 2021-12-20 DIAGNOSIS — L03116 Cellulitis of left lower limb: Secondary | ICD-10-CM | POA: Diagnosis present

## 2021-12-20 DIAGNOSIS — E7849 Other hyperlipidemia: Secondary | ICD-10-CM | POA: Diagnosis not present

## 2021-12-20 DIAGNOSIS — Z888 Allergy status to other drugs, medicaments and biological substances status: Secondary | ICD-10-CM | POA: Diagnosis not present

## 2021-12-20 DIAGNOSIS — I5031 Acute diastolic (congestive) heart failure: Secondary | ICD-10-CM | POA: Diagnosis not present

## 2021-12-20 DIAGNOSIS — L0390 Cellulitis, unspecified: Secondary | ICD-10-CM | POA: Diagnosis present

## 2021-12-20 DIAGNOSIS — R238 Other skin changes: Secondary | ICD-10-CM | POA: Diagnosis present

## 2021-12-20 DIAGNOSIS — Z6841 Body Mass Index (BMI) 40.0 and over, adult: Secondary | ICD-10-CM | POA: Diagnosis not present

## 2021-12-20 LAB — BASIC METABOLIC PANEL WITH GFR
Anion gap: 12 (ref 5–15)
BUN: 23 mg/dL (ref 8–23)
CO2: 26 mmol/L (ref 22–32)
Calcium: 9 mg/dL (ref 8.9–10.3)
Chloride: 97 mmol/L — ABNORMAL LOW (ref 98–111)
Creatinine, Ser: 1.92 mg/dL — ABNORMAL HIGH (ref 0.61–1.24)
GFR, Estimated: 39 mL/min — ABNORMAL LOW
Glucose, Bld: 188 mg/dL — ABNORMAL HIGH (ref 70–99)
Potassium: 3.8 mmol/L (ref 3.5–5.1)
Sodium: 135 mmol/L (ref 135–145)

## 2021-12-20 LAB — GLUCOSE, CAPILLARY
Glucose-Capillary: 213 mg/dL — ABNORMAL HIGH (ref 70–99)
Glucose-Capillary: 157 mg/dL — ABNORMAL HIGH (ref 70–99)
Glucose-Capillary: 149 mg/dL — ABNORMAL HIGH (ref 70–99)
Glucose-Capillary: 202 mg/dL — ABNORMAL HIGH (ref 70–99)

## 2021-12-20 LAB — PROCALCITONIN: Procalcitonin: 0.12 ng/mL

## 2021-12-20 LAB — HEMOGLOBIN A1C
Hgb A1c MFr Bld: 7.2 % — ABNORMAL HIGH (ref 4.8–5.6)
Mean Plasma Glucose: 159.94 mg/dL

## 2021-12-20 LAB — CBG MONITORING, ED: Glucose-Capillary: 159 mg/dL — ABNORMAL HIGH (ref 70–99)

## 2021-12-20 MED ORDER — COLCHICINE 0.6 MG PO TABS
0.6000 mg | ORAL_TABLET | Freq: Every day | ORAL | Status: DC
  Filled 2021-12-20 (×2): qty 1

## 2021-12-20 NOTE — ED Notes (Signed)
Inpatient care handoff given to [facility] [place], RN

## 2021-12-20 NOTE — Hospital Course (Addendum)
Ronnie Hernandez is a 61 y.o. male with medical history significant of HTN, pulmonary embolism, HFpEF, moderate persistent asthma, T2DM, hyperlipidemia gout, morbid obesity presented to hospital with worsening lower extremity edema and dyspnea and shortness of breath with some fluid blisters.  Has been having some shortness of breath with exertion and diminished urinary output despite Lasix at home.  In the ED, patient was slightly hypertensive.  Creatinine was elevated at 2.0 from previous 1.1.  UA was negative for leukocytes and nitrate.  CTA chest showed no acute pulmonary embolism, scattered right upper lobe groundglass opacity likely infectious or inflammatory.  Peripheral calcification of the gallbladder could be porcelain gallbladder.  Possibly underlying cirrhosis.  Ultrasound of the lower extremity was negative for DVT.  Patient was then considered for admission to the hospital for acute kidney injury, diastolic heart failure exacerbation   Assessment and plan.  Principal Problem:   AKI (acute kidney injury) ([place]) Active Problems:   Hyperlipidemia   Morbid obesity ([place])   Hypertension   Type 2 diabetes mellitus ([place])   Cellulitis of left lower extremity   Acute diastolic CHF (congestive heart failure) ([facility])   CHF (congestive heart failure) ([place])   AKI (acute kidney injury) ([place]) Likely secondary to decreased renal perfusion.  Creatinine on admission at 2.02 up from 1.11 with GFR of 37.  Creatinine proving after diuresis.  Trending down to 1.5 today from 1.9 yesterday.  We will continue with current diuresis.    Acute diastolic CHF (congestive heart failure) ([place]) Patient with increasing lower extremity edema blisters and dyspnea.  Continue with IV diuresis.  Patient symptomatically feeling better.  Renal function improving.  CTA without pulmonary embolism.  Continue IV Lasix 40 daily.  On oral Lasix as needed at home.  Cellulitis of left lower extremity On the background of lymphedema.   Possible mild on the left side.  Continue IV Rocephin for now.   Type 2 diabetes mellitus with hyperglycemia ([facility]) Continue sliding scale insulin, Accu-Cheks, diabetic diet. Latest hemoglobin A1c of 7.2 from 12/20/2021.     Hypertension Continue to hold ACE inhibitors due to AKI.  Med rec mentioning both ACEI and ARB.  We will need to sort out prior to discharge.  Blood pressure stable at this time.  Morbid obesity ([facility]) BMI of 72.  Would benefit from ongoing weight loss as outpatient.   Hyperlipidemia Continue statin and fenofibrate

## 2021-12-20 NOTE — Progress Notes (Signed)
PROGRESS NOTE    Ronnie Hernandez  PNT:[id number] DOB: Sep 01, 1960 DOA: 12/19/2021 PCP: [facility] [place]    Brief Narrative:  Ronnie Hernandez is a 61 y.o. male with medical history significant of HTN, pulmonary embolism, HFpEF, moderate persistent asthma, T2DM, hyperlipidemia gout, morbid obesity presented to hospital with worsening lower extremity edema and dyspnea and shortness of breath with some fluid blisters.  Has been having some shortness of breath with exertion and diminished urinary output despite Lasix at home.  In the ED patient was slightly hypertensive.  Creatinine was elevated at 2.0 from previous 1.1.  UA was negative for leukocytes and nitrate.  CTA chest showed no acute pulmonary embolism, scattered right upper lobe groundglass opacity likely infectious or inflammatory.  Peripheral calcification of the gallbladder could be porcelain gallbladder.  Possibly underlying cirrhosis.  Ultrasound of the lower extremity was negative for DVT.  Patient was then considered for admission to the hospital for acute kidney injury, diastolic heart failure exacerbation   Assessment and plan.   AKI (acute kidney injury) ([place]) -Creatinine on admit of 2.02 up from 1.11 with GFR of 37.  Creatinine has slightly improved today after diuresis.  Creatinine today at 1.9.  We will continue to monitor.  Acute diastolic CHF (congestive heart failure) ([place]) Patient with increasing lower extremity edema blisters and dyspnea.  Continue with IV diuresis.  CTA without pulmonary embolism.  Continue IV Lasix 40 daily.  On oral Lasix as needed at home.  Cellulitis of left lower extremity On the background of lymphedema.  Possible mild on the left side.  Continue IV Rocephin   Type 2 diabetes mellitus (HCC) Continue sliding scale insulin, Accu-Cheks, diabetic diet   Hypertension Continue to hold ACE inhibitors due to AKI.  Med rec mentioning both ACEI and ARB.  We will need to  sort out prior to discharge   Morbid obesity ([place]) BMI of 72.  Would benefit from ongoing weight loss as outpatient.   Hyperlipidemia Continue statin and fenofibrate       DVT prophylaxis:    Lovenox subcu  Code Status:     Code Status: Full Code  Disposition: Home in 1 to 2 days  Status is: Observation  The patient will require care spanning > 2 midnights and should be moved to inpatient because: IV diuresis, AKI, volume overload   Family Communication: Communicated with the patient at bedside  Consultants:  None  Procedures:  None  Antimicrobials:  Rocephin IV  Anti-infectives (From admission, onward)    Start     Dose/Rate Route Frequency Ordered Stop   12/20/21 0000  cefTRIAXone (ROCEPHIN) 2 g in sodium chloride 0.9 % 100 mL IVPB        2 g 200 mL/hr over 30 Minutes Intravenous Every 24 hours 12/19/21 2134 12/26/21 2359   12/19/21 1545  ceFAZolin (ANCEF) IVPB 2g/100 mL premix        2 g 200 mL/hr over 30 Minutes Intravenous  Once 12/19/21 1538 12/19/21 1749       Subjective: Today, patient was seen and examined at bedside.  Complains of leg swelling mild shortness of breath and fluid blisters.  No chest pain  Objective: Vitals:   12/20/21 0425 12/20/21 0500 12/20/21 0524 12/20/21 0819  BP: 139/62  (!) 146/75 (!) 122/58  Pulse:   99 97  Resp: '20  20 19  '$ Temp: 97.8 F (36.6 C)  97.7 F (36.5 C) 97.6 F (36.4 C)  TempSrc: Oral  Oral Oral  SpO2: 100%  100% 100%  Weight:  (!) 204.5 kg    Height:        Intake/Output Summary (Last 24 hours) at 12/20/2021 1125 Last data filed at 12/20/2021 1120 Gross per 24 hour  Intake 340 ml  Output 2650 ml  Net -2310 ml   Filed Weights   12/19/21 1154 12/20/21 0500  Weight: (!) 204.1 kg (!) 204.5 kg    Physical Examination: Body mass index is 72.77 kg/m.  General: Morbidly obese built, not in obvious distress HENT:   No scleral pallor or icterus noted. Oral mucosa is moist.  Chest:    Diminished breath  sounds bilaterally.  CVS: S1 &S2 heard. No murmur.  Regular rate and rhythm. Abdomen: Soft, nontender, nondistended.  Bowel sounds are heard.   Extremities: No cyanosis, clubbing with bilateral lower extremity lymphedema edema with blisters and dressing.  Peripheral pulses are palpable. Psych: Alert, awake and oriented, normal mood CNS:  No cranial nerve deficits.  Power equal in all extremities.   Skin: Warm and dry.  No rashes noted.  Data Reviewed:   CBC: Recent Labs  Lab 12/19/21 1347  WBC 7.5  NEUTROABS 4.3  HGB 14.5  HCT 43.2  MCV 101.2*  PLT 536    Basic Metabolic Panel: Recent Labs  Lab 12/19/21 1347 12/20/21 0630  NA 135 135  K 3.9 3.8  CL 97* 97*  CO2 26 26  GLUCOSE 145* 188*  BUN 27* 23  CREATININE 2.02* 1.92*  CALCIUM 9.1 9.0    Liver Function Tests: Recent Labs  Lab 12/19/21 1347  AST 50*  ALT 43  ALKPHOS 53  BILITOT 1.2  PROT 7.5  ALBUMIN 3.5     Radiology Studies: CT Angio Chest PE W and/or Wo Contrast  Result Date: 12/19/2021 CLINICAL DATA:  Pulmonary embolism (PE) suspected, high prob Pleuritic chest pain and shortness of breath, history of PE not on blood thinners. Feels similar. Also edema in legs concerning for CHF worsening EXAM: CT ANGIOGRAPHY CHEST WITH CONTRAST TECHNIQUE: Multidetector CT imaging of the chest was performed using the standard protocol during bolus administration of intravenous contrast. Multiplanar CT image reconstructions and MIPs were obtained to evaluate the vascular anatomy. RADIATION DOSE REDUCTION: This exam was performed according to the departmental dose-optimization program which includes automated exposure control, adjustment of the mA and/or kV according to patient size and/or use of iterative reconstruction technique. CONTRAST:  46m OMNIPAQUE IOHEXOL 350 MG/ML SOLN COMPARISON:  CT dated August 05, 2026 FINDINGS: Cardiovascular: Heart is the mildly enlarged. No acute pulmonary embolism through the proximal  segmental pulmonary arteries. Evaluation of portions of the lungs are limited due to respiratory motion. No pericardial effusion. Mild LEFT-sided coronary artery atherosclerotic calcifications. Scattered atherosclerotic calcifications of the nonaneurysmal thoracic aorta. Mediastinum/Nodes: No axillary or mediastinal adenopathy. Visualized thyroid is unremarkable. Lungs/Pleura: No pleural effusion or pneumothorax. Mild bibasilar atelectasis. Scattered ground-glass opacities in the RIGHT upper lobe, likely infectious or inflammatory in etiology. Upper Abdomen: Morphologic changes of liver with widening of the fissure and suggestion of nodular contours. Peripheral calcifications of the gallbladder at the edge of the field of view. Musculoskeletal: Gynecomastia. Flowing anterior osteophytes with relative preservation of the disc space most consistent with underlying diffuse idiopathic skeletal hyperostosis. Review of the MIP images confirms the above findings. IMPRESSION: 1. No acute pulmonary embolism. 2. Morphologic changes of the liver could reflect underlying cirrhosis. 3. Peripheral calcifications of the gallbladder which could reflect porcelain gallbladder. 4. Scattered RIGHT upper  lobe ground-glass opacities, likely infectious or inflammatory in etiology. Aortic Atherosclerosis (ICD10-I70.0). Electronically Signed   By: Valentino Saxon M.D.   On: 12/19/2021 15:42   VAS [place] LOWER EXTREMITY VENOUS (DVT) (ONLY [facility] & [facility])  Result Date: 12/19/2021  Lower Venous DVT Study Patient Name:  Ronnie Hernandez  Date of Exam:   12/19/2021 Medical Rec #: [id number]          Accession #:    [id number] Date of Birth: 05/05/1961         Patient Gender: M Patient Age:   75 years Exam Location:  [facility] Procedure:      VAS [place] LOWER EXTREMITY VENOUS (DVT) Referring Phys: Marda Stalker --------------------------------------------------------------------------------  Indications: Swelling.  Risk Factors: None  identified. Limitations: Body habitus, poor ultrasound/tissue interface and patient positioning, patient immobility. Comparison Study: No prior studies. Performing Technologist: Oliver Hum RVT  Examination Guidelines: A complete evaluation includes B-mode imaging, spectral Doppler, color Doppler, and power Doppler as needed of all accessible portions of each vessel. Bilateral testing is considered an integral part of a complete examination. Limited examinations for reoccurring indications may be performed as noted. The reflux portion of the exam is performed with the patient in reverse Trendelenburg.  +---------+---------------+---------+-----------+----------+-------------------+ RIGHT    CompressibilityPhasicitySpontaneityPropertiesThrombus Aging      +---------+---------------+---------+-----------+----------+-------------------+ CFV                     Yes      Yes                                      +---------+---------------+---------+-----------+----------+-------------------+ SFJ                                                   Not well visualized +---------+---------------+---------+-----------+----------+-------------------+ FV Prox                 Yes      Yes                                      +---------+---------------+---------+-----------+----------+-------------------+ FV Mid                  Yes      Yes                                      +---------+---------------+---------+-----------+----------+-------------------+ FV Distal               Yes      Yes                                      +---------+---------------+---------+-----------+----------+-------------------+ PFV                                                   Not well visualized +---------+---------------+---------+-----------+----------+-------------------+ POP  Yes      Yes                                       +---------+---------------+---------+-----------+----------+-------------------+ PTV      Full                                                             +---------+---------------+---------+-----------+----------+-------------------+ PERO                                                  Not well visualized +---------+---------------+---------+-----------+----------+-------------------+   +---------+---------------+---------+-----------+----------+-------------------+ LEFT     CompressibilityPhasicitySpontaneityPropertiesThrombus Aging      +---------+---------------+---------+-----------+----------+-------------------+ CFV                     Yes      Yes                                      +---------+---------------+---------+-----------+----------+-------------------+ SFJ                                                   Not well visualized +---------+---------------+---------+-----------+----------+-------------------+ FV Prox                 Yes      Yes                                      +---------+---------------+---------+-----------+----------+-------------------+ FV Mid                  Yes      Yes                                      +---------+---------------+---------+-----------+----------+-------------------+ FV Distal                                             Not well visualized +---------+---------------+---------+-----------+----------+-------------------+ PFV                                                   Not well visualized +---------+---------------+---------+-----------+----------+-------------------+ POP                     Yes      Yes                                      +---------+---------------+---------+-----------+----------+-------------------+  PTV      Full                                                             +---------+---------------+---------+-----------+----------+-------------------+  PERO                                                  Not well visualized +---------+---------------+---------+-----------+----------+-------------------+     Summary: RIGHT: - There is no evidence of deep vein thrombosis in the lower extremity. However, portions of this examination were limited- see technologist comments above.  - No cystic structure found in the popliteal fossa.  LEFT: - There is no evidence of deep vein thrombosis in the lower extremity. However, portions of this examination were limited- see technologist comments above.  - No cystic structure found in the popliteal fossa.  *See table(s) above for measurements and observations.    Preliminary       LOS: 0 days    Flora Lipps, MD [facility] Available via Epic secure chat 7am-7pm After these hours, please refer to coverage provider listed on [email or web address] 12/20/2021, 11:25 AM

## 2021-12-21 ENCOUNTER — Inpatient Hospital Stay (HOSPITAL_COMMUNITY): Payer: Medicare (Managed Care)

## 2021-12-21 DIAGNOSIS — L03116 Cellulitis of left lower limb: Secondary | ICD-10-CM | POA: Diagnosis not present

## 2021-12-21 DIAGNOSIS — I5031 Acute diastolic (congestive) heart failure: Secondary | ICD-10-CM | POA: Diagnosis not present

## 2021-12-21 DIAGNOSIS — N179 Acute kidney failure, unspecified: Secondary | ICD-10-CM | POA: Diagnosis not present

## 2021-12-21 DIAGNOSIS — E7849 Other hyperlipidemia: Secondary | ICD-10-CM | POA: Diagnosis not present

## 2021-12-21 LAB — GLUCOSE, CAPILLARY
Glucose-Capillary: 187 mg/dL — ABNORMAL HIGH (ref 70–99)
Glucose-Capillary: 157 mg/dL — ABNORMAL HIGH (ref 70–99)
Glucose-Capillary: 176 mg/dL — ABNORMAL HIGH (ref 70–99)

## 2021-12-21 LAB — BASIC METABOLIC PANEL
Anion gap: 11 (ref 5–15)
BUN: 20 mg/dL (ref 8–23)
CO2: 27 mmol/L (ref 22–32)
Calcium: 9 mg/dL (ref 8.9–10.3)
Chloride: 99 mmol/L (ref 98–111)
Creatinine, Ser: 1.58 mg/dL — ABNORMAL HIGH (ref 0.61–1.24)
GFR, Estimated: 49 mL/min — ABNORMAL LOW (ref >60)
Glucose, Bld: 172 mg/dL — ABNORMAL HIGH (ref 70–99)
Potassium: 3.5 mmol/L (ref 3.5–5.1)
Sodium: 137 mmol/L (ref 135–145)

## 2021-12-21 LAB — ECHOCARDIOGRAM COMPLETE
Area-P 1/2: 2.77 cm2
Height: 66 in
S' Lateral: 3.4 cm
Weight: 7213.45 oz

## 2021-12-21 LAB — CBC
HCT: 41.3 % (ref 39.0–52.0)
Hemoglobin: 13.9 g/dL (ref 13.0–17.0)
MCH: 33.8 pg (ref 26.0–34.0)
MCHC: 33.7 g/dL (ref 30.0–36.0)
MCV: 100.5 fL — ABNORMAL HIGH (ref 80.0–100.0)
Platelets: 188 10*3/uL (ref 150–400)
RBC: 4.11 MIL/uL — ABNORMAL LOW (ref 4.22–5.81)
RDW: 14.2 % (ref 11.5–15.5)
WBC: 6.3 10*3/uL (ref 4.0–10.5)
nRBC: 0 % (ref 0.0–0.2)

## 2021-12-21 LAB — MAGNESIUM: Magnesium: 2 mg/dL (ref 1.7–2.4)

## 2021-12-21 MED ORDER — POTASSIUM CHLORIDE CRYS ER 20 MEQ PO TBCR
40.0000 meq | EXTENDED_RELEASE_TABLET | Freq: Once | ORAL | Status: AC
Start: 2021-12-21 — End: 2021-12-21
  Administered 2021-12-21: 40 meq via ORAL
  Filled 2021-12-21: qty 2

## 2021-12-21 NOTE — Progress Notes (Signed)
Educated pt on Lasix medication.

## 2021-12-21 NOTE — TOC Initial Note (Addendum)
Transition of Care [facility]) - Initial/Assessment Note    Patient Details  Name: Ronnie Hernandez MRN: [id number] Date of Birth: 08/14/1960  Transition of [facility]) CM/SW Contact:    Marilu Favre, RN Phone Number: 12/21/2021, 2:08 PM  Clinical Narrative:                 Spoke to patient at bedside. Confirmed face sheet information. Niece and her two children ( 2 and 68 year olds) live with him.   He has electric wheelchair, walker , cane and bedside commode.   [facility] goes to [facility] Tuesday mornings. [facility] provides a caregiver Monday, Wednesday , Thursday and Friday mornings 3 hours in the mornings  and Monday through Friday 3 hours in evening. He has requested weekend assistance , waiting to hear back.   Amanda from [place] called . [facility] unable to transport patient home at discharge. Due to patient's weight they cannot transport in [place]. At discharge [facility] instructed NCM to call [facility] and ask for authorization  for PTAR .   PAtient normally uses [employer] for transportation .   Expected Discharge Plan: Home/Self Care Barriers to Discharge: Continued Medical Work up   Patient Goals and CMS Choice Patient states their goals for this hospitalization and ongoing recovery are:: to return to home      Expected Discharge Plan and Services Expected Discharge Plan: Home/Self Care   Discharge Planning Services: CM Consult   Living arrangements for the past 2 months: Apartment                 DME Arranged: N/A DME Agency: NA       HH Arranged: NA          Prior Living Arrangements/Services Living arrangements for the past 2 months: Apartment Lives with:: Relatives (niece) Patient language and need for interpreter reviewed:: Yes Do you feel safe going back to the place where you live?: Yes      Need for Family Participation in Patient Care: Yes (Comment) Care giver support system in place?: Yes (comment) Current home services: DME Criminal Activity/Legal Involvement  Pertinent to Current Situation/Hospitalization: No - Comment as needed  Activities of Daily Living      Permission Sought/Granted   Permission granted to share information with : No              Emotional Assessment Appearance:: Appears stated age Attitude/Demeanor/Rapport: Engaged Affect (typically observed): Accepting Orientation: : Oriented to Situation, Oriented to  Time, Oriented to Place, Oriented to Self Alcohol / Substance Use: Not Applicable Psych Involvement: No (comment)  Admission diagnosis:  Cellulitis [L03.90] SOB (shortness of breath) [R06.02] AKI (acute kidney injury) ([place]) [N17.9] Edema, unspecified type [R60.9] Cellulitis, unspecified cellulitis site [L03.90] CHF (congestive heart failure) (HCC) [I50.9] Patient Active Problem List   Diagnosis Date Noted   CHF (congestive heart failure) ([place]) 12/20/2021   AKI (acute kidney injury) ([place]) 12/19/2021   Chronic diastolic CHF (congestive heart failure) ([place]) 12/19/2021   Cellulitis 06/05/2020   Acute on chronic heart failure with preserved ejection fraction (HFpEF) ([place])    Bilateral lower extremity edema 06/03/2020   Polyarthritis 08/17/2019   Physical deconditioning 08/16/2019   Pressure injury of skin 08/16/2019   Gout attack 08/15/2019   Moderate persistent asthma with exacerbation    Acute respiratory failure with hypoxia (HCC) 26/83/4196   Acute diastolic CHF (congestive heart failure) ([place]) 08/26/2018   Left knee pain 03/31/2018   Knee pain    Acute gout  04/18/2017   Pulmonary embolus ([place]) 08/05/2016   Hyperglycemia, drug-induced 07/08/2016   Impaired glucose tolerance 07/08/2016   Leukocytosis 07/06/2016   Unable to walk 07/06/2016   Acute gouty arthritis 07/06/2016   Abnormality of gait 02/18/2016   Cellulitis of left lower extremity 01/27/2016   Type 2 diabetes mellitus ([place]) 12/25/2013   Drug-induced gout 12/25/2013   Hyperlipidemia 08/01/2013   Morbid obesity ([place])    Hypertension     Gout    Colon cancer screening 07/27/2011   Benign neoplasm of colon 07/27/2011   PCP:  [facility] [place]:   [facility] [place] [address] [place] [address] Phone: [phone] Fax: [phone]     Social Determinants of Health (SDOH) Interventions    Readmission Risk Interventions     No data to display

## 2021-12-21 NOTE — Progress Notes (Signed)
  Echocardiogram 2D Echocardiogram has been performed.  Ronnie Hernandez 12/21/2021, 12:27 PM

## 2021-12-21 NOTE — Progress Notes (Addendum)
PROGRESS NOTE    Ronnie Hernandez  WIO:[id number] DOB: 11-18-1960 DOA: 12/19/2021 PCP: [facility]    Brief Narrative:  Ronnie Hernandez is a 61 y.o. male with medical history significant of HTN, pulmonary embolism, HFpEF, moderate persistent asthma, T2DM, hyperlipidemia gout, morbid obesity presented to hospital with worsening lower extremity edema and dyspnea and shortness of breath with some fluid blisters.  Has been having some shortness of breath with exertion and diminished urinary output despite Lasix at home.  In the ED, patient was slightly hypertensive.  Creatinine was elevated at 2.0 from previous 1.1.  UA was negative for leukocytes and nitrate.  CTA chest showed no acute pulmonary embolism, scattered right upper lobe groundglass opacity likely infectious or inflammatory.  Peripheral calcification of the gallbladder could be porcelain gallbladder.  Possibly underlying cirrhosis.  Ultrasound of the lower extremity was negative for DVT.  Patient was then considered for admission to the hospital for acute kidney injury, diastolic heart failure exacerbation   Assessment and plan.  Principal Problem:   AKI (acute kidney injury) ([place]) Active Problems:   Hyperlipidemia   Morbid obesity ([place])   Hypertension   Type 2 diabetes mellitus ([place])   Cellulitis of left lower extremity   Acute diastolic CHF (congestive heart failure) (H[facility])   CHF (congestive heart failure) ([place])   AKI (acute kidney injury) ([place]) Likely secondary to decreased renal perfusion.  Creatinine on admission at 2.02 up from 1.11 with GFR of 37.  Creatinine proving after diuresis.  Trending down to 1.5 today from 1.9 yesterday.  We will continue with current diuresis.    Acute diastolic CHF (congestive heart failure) ([place]) Patient with increasing lower extremity edema blisters and dyspnea.  Continue with IV diuresis.  Patient symptomatically feeling better.  Renal function improving.  CTA  without pulmonary embolism.  Continue IV Lasix 40 daily.  On oral Lasix as needed at home.  Cellulitis of left lower extremity On the background of lymphedema.  Possible mild on the left side.  Continue IV Rocephin for now.   Type 2 diabetes mellitus with hyperglycemia (HCC) Continue sliding scale insulin, Accu-Cheks, diabetic diet. Latest hemoglobin A1c of 7.2 from 12/20/2021.     Hypertension Continue to hold ACE inhibitors due to AKI.  Med rec mentioning both ACEI and ARB.  We will need to sort out prior to discharge.  Blood pressure stable at this time.  Morbid obesity (HCC) BMI of 72.  Would benefit from ongoing weight loss as outpatient.   Hyperlipidemia Continue statin and fenofibrate       DVT prophylaxis:    Lovenox subcu  Code Status:     Code Status: Full Code  Disposition: Home in 1 to 2 days  Status is: Inpatient  The patient is inpatient because: IV diuresis, AKI, volume overload   Family Communication:  Communicated with the patient at bedside  Consultants:  None  Procedures:  None  Antimicrobials:  Rocephin IV 7/2>  Anti-infectives (From admission, onward)    Start     Dose/Rate Route Frequency Ordered Stop   12/20/21 0000  cefTRIAXone (ROCEPHIN) 2 g in sodium chloride 0.9 % 100 mL IVPB        2 g 200 mL/hr over 30 Minutes Intravenous Every 24 hours 12/19/21 2134 12/26/21 2359   12/19/21 1545  ceFAZolin (ANCEF) IVPB 2g/100 mL premix        2 g 200 mL/hr over 30 Minutes Intravenous  Once 12/19/21 1538 12/19/21  1749       Subjective: Today, patient was seen and examined at bedside.  Continues to complain of shortness of breath no chest pain palpitation fever chills or rigor.  Objective: Vitals:   12/20/21 1500 12/20/21 2026 12/21/21 0505 12/21/21 0742  BP: (!) 136/106 (!) 109/58 122/61 (!) 127/57  Pulse: 95 88 73 74  Resp: '17 18 16 18  '$ Temp: 98 F (36.7 C) 97.8 F (36.6 C) 97.7 F (36.5 C) 97.8 F (36.6 C)  TempSrc: Oral Oral Oral Oral   SpO2: 100% 96% 100% 100%  Weight:      Height:        Intake/Output Summary (Last 24 hours) at 12/21/2021 1405 Last data filed at 12/21/2021 0819 Gross per 24 hour  Intake 440 ml  Output 1300 ml  Net -860 ml   Filed Weights   12/19/21 1154 12/20/21 0500  Weight: (!) 204.1 kg (!) 204.5 kg    Physical Examination: Body mass index is 72.77 kg/m.  General: Morbidly obese built, not in obvious distress HENT:   No scleral pallor or icterus noted. Oral mucosa is moist.  Chest:    Diminished breath sounds bilaterally. No crackles or wheezes.  CVS: S1 &S2 heard. No murmur.  Regular rate and rhythm. Abdomen: Soft, nontender, nondistended.  Bowel sounds are heard.   Extremities: No cyanosis, clubbing with bilateral lower extremity lymphedema, edema with mild blisters  Psych: Alert, awake and oriented, normal mood CNS:  No cranial nerve deficits.  Power equal in all extremities.   Skin: Warm and dry.  Lower extremity edema lymphedema   Data Reviewed:   CBC: Recent Labs  Lab 12/19/21 1347 12/21/21 0528  WBC 7.5 6.3  NEUTROABS 4.3  --   HGB 14.5 13.9  HCT 43.2 41.3  MCV 101.2* 100.5*  PLT 224 543    Basic Metabolic Panel: Recent Labs  Lab 12/19/21 1347 12/20/21 0630 12/21/21 0528  NA 135 135 137  K 3.9 3.8 3.5  CL 97* 97* 99  CO2 '26 26 27  '$ GLUCOSE 145* 188* 172*  BUN 27* 23 20  CREATININE 2.02* 1.92* 1.58*  CALCIUM 9.1 9.0 9.0  MG  --   --  2.0    Liver Function Tests: Recent Labs  Lab 12/19/21 1347  AST 50*  ALT 43  ALKPHOS 53  BILITOT 1.2  PROT 7.5  ALBUMIN 3.5     Radiology Studies: ECHOCARDIOGRAM COMPLETE  Result Date: 12/21/2021    ECHOCARDIOGRAM REPORT   Patient Name:   Ronnie Hernandez Date of Exam: 12/21/2021 Medical Rec #:  [id number]         Height:       66.0 in Accession #:    [id number]        Weight:       450.8 lb Date of Birth:  1961-04-02        BSA:          2.825 m Patient Age:    89 years          BP:           127/57 mmHg Patient  Gender: M                 HR:           94 bpm. Exam Location:  Inpatient Procedure: 2D Echo, Cardiac Doppler and Color Doppler Indications:    CHF Acute Diastolic  History:        Patient has prior  history of Echocardiogram examinations, most                 recent 06/04/2020. Risk Factors:Hypertension, Dyslipidemia,                 Diabetes and obesity.  Sonographer:    Eartha Inch Referring Phys: [id number] [place]  Sonographer Comments: Patient is morbidly obese. Image acquisition challenging due to patient body habitus. IMPRESSIONS  1. Extremely limited due to poor sound wave transmission; full doppler study not performed; LV function appears to be preserved.  2. Left ventricular ejection fraction, by estimation, is 60 to 65%. The left ventricle has normal function. The left ventricle has no regional wall motion abnormalities. Left ventricular diastolic parameters were normal.  3. Right ventricular systolic function was not well visualized. The right ventricular size is not well visualized.  4. The mitral valve is normal in structure. No evidence of mitral valve regurgitation.  5. Tricuspid valve regurgitation Not well visualized.  6. The aortic valve was not well visualized. Aortic valve regurgitation Not well visualized.  7. Pulmonic valve regurgitation Not well visualized. FINDINGS  Left Ventricle: Left ventricular ejection fraction, by estimation, is 60 to 65%. The left ventricle has normal function. The left ventricle has no regional wall motion abnormalities. The left ventricular internal cavity size was normal in size. There is  no left ventricular hypertrophy. Left ventricular diastolic parameters were normal. Right Ventricle: The right ventricular size is not well visualized. Right vetricular wall thickness was not well visualized. Right ventricular systolic function was not well visualized. Left Atrium: Left atrial size was normal in size. Right Atrium: Right atrial size was not well  visualized. Pericardium: There is no evidence of pericardial effusion. Mitral Valve: The mitral valve is normal in structure. No evidence of mitral valve regurgitation. Tricuspid Valve: The tricuspid valve is not assessed. Tricuspid valve regurgitation Not well visualized. Aortic Valve: The aortic valve was not well visualized. Aortic valve regurgitation Not well visualized. Pulmonic Valve: The pulmonic valve was not assessed. Pulmonic valve regurgitation Not well visualized. Aorta: The aortic root was not well visualized. Venous: The inferior vena cava was not well visualized. IAS/Shunts: The interatrial septum was not assessed. Additional Comments: Extremely limited due to poor sound wave transmission; full doppler study not performed; LV function appears to be preserved.  LEFT VENTRICLE PLAX 2D LVIDd:         4.10 cm Diastology LVIDs:         3.40 cm LV e' medial:    10.20 cm/s LV PW:         1.00 cm LV E/e' medial:  7.9 LV IVS:        1.10 cm LV e' lateral:   8.27 cm/s                        LV E/e' lateral: 9.8  LEFT ATRIUM         Index LA diam:    2.80 cm 0.99 cm/m  AORTIC VALVE LVOT Vmax:   87.60 cm/s LVOT Vmean:  64.700 cm/s LVOT VTI:    0.168 m MITRAL VALVE MV Area (PHT): 2.77 cm    SHUNTS MV Decel Time: 274 msec    Systemic VTI: 0.17 m MV E velocity: 80.70 cm/s MV A velocity: 86.20 cm/s MV E/A ratio:  0.94 Kirk Ruths MD Electronically signed by Kirk Ruths MD Signature Date/Time: 12/21/2021/1:10:44 PM    Final    CT Angio Chest  PE W and/or Wo Contrast  Result Date: 12/19/2021 CLINICAL DATA:  Pulmonary embolism (PE) suspected, high prob Pleuritic chest pain and shortness of breath, history of PE not on blood thinners. Feels similar. Also edema in legs concerning for CHF worsening EXAM: CT ANGIOGRAPHY CHEST WITH CONTRAST TECHNIQUE: Multidetector CT imaging of the chest was performed using the standard protocol during bolus administration of intravenous contrast. Multiplanar CT image  reconstructions and MIPs were obtained to evaluate the vascular anatomy. RADIATION DOSE REDUCTION: This exam was performed according to the departmental dose-optimization program which includes automated exposure control, adjustment of the mA and/or kV according to patient size and/or use of iterative reconstruction technique. CONTRAST:  17m OMNIPAQUE IOHEXOL 350 MG/ML SOLN COMPARISON:  CT dated August 05, 2026 FINDINGS: Cardiovascular: Heart is the mildly enlarged. No acute pulmonary embolism through the proximal segmental pulmonary arteries. Evaluation of portions of the lungs are limited due to respiratory motion. No pericardial effusion. Mild LEFT-sided coronary artery atherosclerotic calcifications. Scattered atherosclerotic calcifications of the nonaneurysmal thoracic aorta. Mediastinum/Nodes: No axillary or mediastinal adenopathy. Visualized thyroid is unremarkable. Lungs/Pleura: No pleural effusion or pneumothorax. Mild bibasilar atelectasis. Scattered ground-glass opacities in the RIGHT upper lobe, likely infectious or inflammatory in etiology. Upper Abdomen: Morphologic changes of liver with widening of the fissure and suggestion of nodular contours. Peripheral calcifications of the gallbladder at the edge of the field of view. Musculoskeletal: Gynecomastia. Flowing anterior osteophytes with relative preservation of the disc space most consistent with underlying diffuse idiopathic skeletal hyperostosis. Review of the MIP images confirms the above findings. IMPRESSION: 1. No acute pulmonary embolism. 2. Morphologic changes of the liver could reflect underlying cirrhosis. 3. Peripheral calcifications of the gallbladder which could reflect porcelain gallbladder. 4. Scattered RIGHT upper lobe ground-glass opacities, likely infectious or inflammatory in etiology. Aortic Atherosclerosis (ICD10-I70.0). Electronically Signed   By: SValentino SaxonM.D.   On: 12/19/2021 15:42      LOS: 1 day    LFlora Lipps MD [facility] Available via Epic secure chat 7am-7pm After these hours, please refer to coverage provider listed on [email or web address] 12/21/2021, 2:05 PM

## 2021-12-22 DIAGNOSIS — E7849 Other hyperlipidemia: Secondary | ICD-10-CM | POA: Diagnosis not present

## 2021-12-22 DIAGNOSIS — L03116 Cellulitis of left lower limb: Secondary | ICD-10-CM | POA: Diagnosis not present

## 2021-12-22 DIAGNOSIS — N179 Acute kidney failure, unspecified: Secondary | ICD-10-CM | POA: Diagnosis not present

## 2021-12-22 DIAGNOSIS — I5031 Acute diastolic (congestive) heart failure: Secondary | ICD-10-CM | POA: Diagnosis not present

## 2021-12-22 LAB — BASIC METABOLIC PANEL
Anion gap: 10 (ref 5–15)
BUN: 21 mg/dL (ref 8–23)
CO2: 29 mmol/L (ref 22–32)
Calcium: 9.1 mg/dL (ref 8.9–10.3)
Chloride: 99 mmol/L (ref 98–111)
Creatinine, Ser: 1.58 mg/dL — ABNORMAL HIGH (ref 0.61–1.24)
GFR, Estimated: 49 mL/min — ABNORMAL LOW (ref >60)
Glucose, Bld: 214 mg/dL — ABNORMAL HIGH (ref 70–99)
Potassium: 3.6 mmol/L (ref 3.5–5.1)
Sodium: 138 mmol/L (ref 135–145)

## 2021-12-22 LAB — CBC
HCT: 40.5 % (ref 39.0–52.0)
Hemoglobin: 13.2 g/dL (ref 13.0–17.0)
MCH: 33.2 pg (ref 26.0–34.0)
MCHC: 32.6 g/dL (ref 30.0–36.0)
MCV: 102 fL — ABNORMAL HIGH (ref 80.0–100.0)
Platelets: 188 10*3/uL (ref 150–400)
RBC: 3.97 MIL/uL — ABNORMAL LOW (ref 4.22–5.81)
RDW: 14.3 % (ref 11.5–15.5)
WBC: 7.3 10*3/uL (ref 4.0–10.5)
nRBC: 0 % (ref 0.0–0.2)

## 2021-12-22 LAB — GLUCOSE, CAPILLARY
Glucose-Capillary: 189 mg/dL — ABNORMAL HIGH (ref 70–99)
Glucose-Capillary: 191 mg/dL — ABNORMAL HIGH (ref 70–99)
Glucose-Capillary: 191 mg/dL — ABNORMAL HIGH (ref 70–99)
Glucose-Capillary: 196 mg/dL — ABNORMAL HIGH (ref 70–99)
Glucose-Capillary: 213 mg/dL — ABNORMAL HIGH (ref 70–99)

## 2021-12-22 LAB — MAGNESIUM: Magnesium: 2 mg/dL (ref 1.7–2.4)

## 2021-12-22 MED ORDER — METFORMIN HCL 500 MG PO TABS: 1000.0000 mg | ORAL_TABLET | Freq: Two times a day (BID) | ORAL | Status: DC

## 2021-12-22 MED ORDER — FUROSEMIDE 40 MG PO TABS: ORAL_TABLET | ORAL | 0 refills | Status: DC

## 2021-12-22 MED ORDER — LOSARTAN POTASSIUM 25 MG PO TABS: 25.0000 mg | ORAL_TABLET | Freq: Every day | ORAL | Status: DC

## 2021-12-22 MED ORDER — CEPHALEXIN 500 MG PO CAPS: 500.0000 mg | ORAL_CAPSULE | Freq: Two times a day (BID) | ORAL | 0 refills | Status: AC

## 2021-12-22 NOTE — Progress Notes (Signed)
RN gave patient discharge instructions and removed his IV's, patient stated understanding of discharge instructions and had no questions. New medication escribed to patients home pharmacy. Pt waiting for [facility] pickup

## 2021-12-22 NOTE — Discharge Summary (Signed)
Physician Discharge Summary   Patient: Ronnie Hernandez MRN: [id number] DOB: [birth year]  Admit date:     12/19/2021  Discharge date: 12/22/21  Discharge Physician: Flora Lipps   PCP: [facility] [place]   Recommendations at discharge:   Follow-up with  primary care provider in 1 week.  Check CBC, BMP, magnesium, LFT in the next visit.  Discharge Diagnoses: Principal Problem:   Acute diastolic CHF (congestive heart failure) (HCC) Active Problems:   Hyperlipidemia   Morbid obesity (HCC)   Hypertension   Type 2 diabetes mellitus (HCC)   Cellulitis of left lower extremity   AKI (acute kidney injury) ([place])  Resolved Problems:   * No resolved hospital problems. *  Hospital Course: Ronnie Hernandez is a 61 y.o. male with medical history significant of HTN, pulmonary embolism, HFpEF, moderate persistent asthma, T2DM, hyperlipidemia gout, morbid obesity presented to he hospital with worsening lower extremity edema and dyspnea and shortness of breath with some fluid blisters.  Patient had been having some shortness of breath with exertion and diminished urinary output despite Lasix at home.  In the ED, patient was slightly hypertensive.  Creatinine was elevated at 2.0 from previous 1.1.  UA was negative for leukocytes and nitrate.  CTA chest showed no acute pulmonary embolism, scattered right upper lobe groundglass opacity likely infectious or inflammatory.  Peripheral calcification of the gallbladder could be porcelain gallbladder.  Possibly underlying cirrhosis.  Ultrasound of the lower extremity was negative for DVT.  Patient was then considered for admission to the hospital for acute kidney injury, diastolic heart failure exacerbation  Following conditions were addressed during hospitalization,   AKI (acute kidney injury) ([place]) Likely secondary to decreased renal perfusion, cardiorenal syndrome..  Creatinine on admission at 2.02 up from 1.11.  Creatinine has  improved after IV diuresis.  Creatinine trending down to 1.5 today.  Patient has had good diuresis and has clinically improved symptoms.  Patient was counseled regarding fluid restriction and low-salt diet and importance of daily weights.  Patient is negative balance for 4370 mL at this time.Marland Kitchen  He was advised to take Lasix daily at home rather than as needed and will need to follow-up with BMP as outpatient.  Acute diastolic CHF (congestive heart failure) ([place]) Presented with lower extremity edema blisters and dyspnea.  IV diuresis with Lasix 40 mg daily.  Renal function improved with diuresis.  CTA without pulmonary embolism.  Patient will be advised to 40 mg of oral Lasix daily on discharge with fluid restriction, low-salt diet and CHF protocol at home.  Cellulitis of left lower extremity On the background of lymphedema.  Mild cellulitis on the left.  We will continue Keflex for 3 more days on discharge, received Rocephin during hospitalization.     Type 2 diabetes mellitus with hyperglycemia (HCC) Latest hemoglobin A1c of 7.2 from 12/20/2021.  On metformin at home.  Recommend diabetic diet.    Hypertension We will restart losartan on 12/24/2021.  Lisinopril was also mentioned on the med rec which has been discontinued.  Morbid obesity (HCC) BMI of 72.  Would benefit from ongoing weight loss as outpatient.   Hyperlipidemia Continue statin and fenofibrate on discharge.     Consultants: None  Procedures performed: None  Disposition: Home Diet recommendation:  Discharge Diet Orders (From admission, onward)     Start     Ordered   12/22/21 0000  Diet - low sodium heart healthy        12/22/21 1152  12/22/21 0000  Diet Carb Modified        12/22/21 1152           Cardiac and Carb modified diet DISCHARGE MEDICATION: Allergies as of 12/22/2021       Reactions   Uloric [febuxostat] Other (See Comments)   Made the skin on feet and legs "hurt"        Medication List     STOP  taking these medications    lisinopril 2.5 MG tablet Commonly known as: ZESTRIL       TAKE these medications    albuterol 108 (90 Base) MCG/ACT inhaler Commonly known as: VENTOLIN HFA Inhale 1-2 puffs into the lungs every 6 (six) hours as needed for wheezing or shortness of breath.   allopurinol 300 MG tablet Commonly known as: ZYLOPRIM Take 1 tablet (300 mg total) by mouth 2 (two) times daily. What changed:  how much to take when to take this   ascorbic acid 500 MG tablet Commonly known as: VITAMIN C Take 500 mg by mouth daily.   atorvastatin 80 MG tablet Commonly known as: LIPITOR Take 80 mg by mouth daily.   cephALEXin 500 MG capsule Commonly known as: KEFLEX Take 1 capsule (500 mg total) by mouth 2 (two) times daily for 3 days.   colchicine 0.6 MG tablet Take 1 tablet (0.6 mg total) by mouth daily. What changed: when to take this   fenofibrate 160 MG tablet Take 160 mg by mouth daily.   fluticasone 50 MCG/ACT nasal spray Commonly known as: FLONASE Place 1 spray into both nostrils daily as needed for allergies or rhinitis.   furosemide 40 MG tablet Commonly known as: LASIX Take one tablet by mouth once daily What changed:  how much to take how to take this when to take this reasons to take this additional instructions   Invokana 300 MG Tabs tablet Generic drug: canagliflozin Take 300 mg by mouth daily.   levothyroxine 112 MCG tablet Commonly known as: SYNTHROID Take 112 mcg by mouth daily before breakfast.   linagliptin 5 MG Tabs tablet Commonly known as: TRADJENTA Take 5 mg by mouth daily.   losartan 25 MG tablet Commonly known as: COZAAR Take 1 tablet (25 mg total) by mouth daily. Start taking on: December 24, 2021 What changed: These instructions start on December 24, 2021. If you are unsure what to do until then, ask your doctor or other care provider.   magnesium chloride 64 MG Tbec SR tablet Commonly known as: SLOW-MAG Take 1 tablet by mouth  daily as needed (muscle relaxation).   menthol-zinc oxide powder Apply topically See admin instructions. Apply topically twice daily to abdominal folds.   metFORMIN 500 MG tablet Commonly known as: GLUCOPHAGE Take 2 tablets (1,000 mg total) by mouth 2 (two) times daily with a meal. Start taking on: December 24, 2021 What changed: These instructions start on December 24, 2021. If you are unsure what to do until then, ask your doctor or other care provider.   Tacrolimus 0.1 % Crea Apply topically.   triamcinolone ointment 0.1 % Commonly known as: KENALOG Apply 1 Application topically 2 (two) times daily. Alternate with tacrolimus two weeks on two weeks off   UNABLE TO FIND bariatric drop arm commode Dx) R54, E66.01, I50.9, M19.9   UNABLE TO FIND Bariatric tub transfer bench Dx) R54, E66.01, I50.9, M19.9   UNABLE TO FIND Bariatric lift chair. Dx) R54, E66.01, I50.9, M19.9   UNABLE TO FIND Heavy duty Kasandra Knudsen Dx)  R54, E66.01, I50.9, M19.9   vitamin D3 50 MCG (2000 UT) Caps Take 2,000 Units by mouth daily.        Subjective Today, patient was seen and examined at bedside.  Denies any chest pain, shortness of breath, fever, chills or rigor.  Has had good diuresis and feels much better.  Discharge Exam: Filed Weights   12/19/21 1154 12/20/21 0500 12/22/21 0700  Weight: (!) 204.1 kg (!) 204.5 kg (!) 232 kg      12/22/2021    7:58 AM 12/22/2021    7:00 AM 12/22/2021    6:28 AM  Vitals with BMI  Weight  511 lbs 7 oz   BMI  45.40   Systolic 981  191  Diastolic 69  78  Pulse 85  78    General: Morbidly obese built,, not in obvious distress HENT:   No scleral pallor or icterus noted. Oral mucosa is moist.  Chest:    Diminished breath sounds bilaterally. No crackles or wheezes.  CVS: S1 &S2 heard. No murmur.  Regular rate and rhythm. Abdomen: Soft, nontender, nondistended.  Bowel sounds are heard.   Extremities: No cyanosis, clubbing but with bilateral lymphedema, mild pitting edema.   Minimal blisters.  Peripheral pulses are palpable. Psych: Alert, awake and oriented, normal mood CNS:  No cranial nerve deficits.  Power equal in all extremities.   Skin: Warm and dry.  Left lower extremity lymphedema, edema  Condition at discharge: good  The results of significant diagnostics from this hospitalization (including imaging, microbiology, ancillary and laboratory) are listed below for reference.   Imaging Studies: ECHOCARDIOGRAM COMPLETE  Result Date: 12/21/2021    ECHOCARDIOGRAM REPORT   Patient Name:   NIDAL RIVET Date of Exam: 12/21/2021 Medical Rec #:  [id number]         Height:       66.0 in Accession #:    [id number]        Weight:       450.8 lb Date of Birth:  1960-12-20        BSA:          2.825 m Patient Age:    73 years          BP:           127/57 mmHg Patient Gender: M                 HR:           94 bpm. Exam Location:  Inpatient Procedure: 2D Echo, Cardiac Doppler and Color Doppler Indications:    CHF Acute Diastolic  History:        Patient has prior history of Echocardiogram examinations, most                 recent 06/04/2020. Risk Factors:Hypertension, Dyslipidemia,                 Diabetes and obesity.  Sonographer:    Eartha Inch Referring Phys: [id number] [place]  Sonographer Comments: Patient is morbidly obese. Image acquisition challenging due to patient body habitus. IMPRESSIONS  1. Extremely limited due to poor sound wave transmission; full doppler study not performed; LV function appears to be preserved.  2. Left ventricular ejection fraction, by estimation, is 60 to 65%. The left ventricle has normal function. The left ventricle has no regional wall motion abnormalities. Left ventricular diastolic parameters were normal.  3. Right ventricular systolic function was not well visualized. The right ventricular size  is not well visualized.  4. The mitral valve is normal in structure. No evidence of mitral valve regurgitation.  5. Tricuspid valve  regurgitation Not well visualized.  6. The aortic valve was not well visualized. Aortic valve regurgitation Not well visualized.  7. Pulmonic valve regurgitation Not well visualized. FINDINGS  Left Ventricle: Left ventricular ejection fraction, by estimation, is 60 to 65%. The left ventricle has normal function. The left ventricle has no regional wall motion abnormalities. The left ventricular internal cavity size was normal in size. There is  no left ventricular hypertrophy. Left ventricular diastolic parameters were normal. Right Ventricle: The right ventricular size is not well visualized. Right vetricular wall thickness was not well visualized. Right ventricular systolic function was not well visualized. Left Atrium: Left atrial size was normal in size. Right Atrium: Right atrial size was not well visualized. Pericardium: There is no evidence of pericardial effusion. Mitral Valve: The mitral valve is normal in structure. No evidence of mitral valve regurgitation. Tricuspid Valve: The tricuspid valve is not assessed. Tricuspid valve regurgitation Not well visualized. Aortic Valve: The aortic valve was not well visualized. Aortic valve regurgitation Not well visualized. Pulmonic Valve: The pulmonic valve was not assessed. Pulmonic valve regurgitation Not well visualized. Aorta: The aortic root was not well visualized. Venous: The inferior vena cava was not well visualized. IAS/Shunts: The interatrial septum was not assessed. Additional Comments: Extremely limited due to poor sound wave transmission; full doppler study not performed; LV function appears to be preserved.  LEFT VENTRICLE PLAX 2D LVIDd:         4.10 cm Diastology LVIDs:         3.40 cm LV e' medial:    10.20 cm/s LV PW:         1.00 cm LV E/e' medial:  7.9 LV IVS:        1.10 cm LV e' lateral:   8.27 cm/s                        LV E/e' lateral: 9.8  LEFT ATRIUM         Index LA diam:    2.80 cm 0.99 cm/m  AORTIC VALVE LVOT Vmax:   87.60 cm/s LVOT  Vmean:  64.700 cm/s LVOT VTI:    0.168 m MITRAL VALVE MV Area (PHT): 2.77 cm    SHUNTS MV Decel Time: 274 msec    Systemic VTI: 0.17 m MV E velocity: 80.70 cm/s MV A velocity: 86.20 cm/s MV E/A ratio:  0.94 Kirk Ruths MD Electronically signed by Kirk Ruths MD Signature Date/Time: 12/21/2021/1:10:44 PM    Final    VAS [place] LOWER EXTREMITY VENOUS (DVT) (ONLY [facility] & [facility])  Result Date: 12/20/2021  Lower Venous DVT Study Patient Name:  YUAN GANN  Date of Exam:   12/19/2021 Medical Rec #: [id number]          Accession #:    [id number] Date of Birth: 1960-07-26         Patient Gender: M Patient Age:   5 years Exam Location:  [facility] Procedure:      VAS [place] LOWER EXTREMITY VENOUS (DVT) Referring Phys: Marda Stalker --------------------------------------------------------------------------------  Indications: Swelling.  Risk Factors: None identified. Limitations: Body habitus, poor ultrasound/tissue interface and patient positioning, patient immobility. Comparison Study: No prior studies. Performing Technologist: Oliver Hum RVT  Examination Guidelines: A complete evaluation includes B-mode imaging, spectral Doppler, color Doppler, and power Doppler as needed of  all accessible portions of each vessel. Bilateral testing is considered an integral part of a complete examination. Limited examinations for reoccurring indications may be performed as noted. The reflux portion of the exam is performed with the patient in reverse Trendelenburg.  +---------+---------------+---------+-----------+----------+-------------------+ RIGHT    CompressibilityPhasicitySpontaneityPropertiesThrombus Aging      +---------+---------------+---------+-----------+----------+-------------------+ CFV                     Yes      Yes                                      +---------+---------------+---------+-----------+----------+-------------------+ SFJ                                                    Not well visualized +---------+---------------+---------+-----------+----------+-------------------+ FV Prox                 Yes      Yes                                      +---------+---------------+---------+-----------+----------+-------------------+ FV Mid                  Yes      Yes                                      +---------+---------------+---------+-----------+----------+-------------------+ FV Distal               Yes      Yes                                      +---------+---------------+---------+-----------+----------+-------------------+ PFV                                                   Not well visualized +---------+---------------+---------+-----------+----------+-------------------+ POP                     Yes      Yes                                      +---------+---------------+---------+-----------+----------+-------------------+ PTV      Full                                                             +---------+---------------+---------+-----------+----------+-------------------+ PERO  Not well visualized +---------+---------------+---------+-----------+----------+-------------------+   +---------+---------------+---------+-----------+----------+-------------------+ LEFT     CompressibilityPhasicitySpontaneityPropertiesThrombus Aging      +---------+---------------+---------+-----------+----------+-------------------+ CFV                     Yes      Yes                                      +---------+---------------+---------+-----------+----------+-------------------+ SFJ                                                   Not well visualized +---------+---------------+---------+-----------+----------+-------------------+ FV Prox                 Yes      Yes                                       +---------+---------------+---------+-----------+----------+-------------------+ FV Mid                  Yes      Yes                                      +---------+---------------+---------+-----------+----------+-------------------+ FV Distal                                             Not well visualized +---------+---------------+---------+-----------+----------+-------------------+ PFV                                                   Not well visualized +---------+---------------+---------+-----------+----------+-------------------+ POP                     Yes      Yes                                      +---------+---------------+---------+-----------+----------+-------------------+ PTV      Full                                                             +---------+---------------+---------+-----------+----------+-------------------+ PERO                                                  Not well visualized +---------+---------------+---------+-----------+----------+-------------------+     Summary: RIGHT: - There is no evidence of deep vein thrombosis in the lower extremity. However, portions of this examination were limited- see technologist comments above.  - No cystic structure found in the  popliteal fossa.  LEFT: - There is no evidence of deep vein thrombosis in the lower extremity. However, portions of this examination were limited- see technologist comments above.  - No cystic structure found in the popliteal fossa.  *See table(s) above for measurements and observations. Electronically signed by Jamelle Haring on 12/20/2021 at 12:20:03 PM.    Final    CT Angio Chest PE W and/or Wo Contrast  Result Date: 12/19/2021 CLINICAL DATA:  Pulmonary embolism (PE) suspected, high prob Pleuritic chest pain and shortness of breath, history of PE not on blood thinners. Feels similar. Also edema in legs concerning for CHF worsening EXAM: CT ANGIOGRAPHY CHEST WITH CONTRAST  TECHNIQUE: Multidetector CT imaging of the chest was performed using the standard protocol during bolus administration of intravenous contrast. Multiplanar CT image reconstructions and MIPs were obtained to evaluate the vascular anatomy. RADIATION DOSE REDUCTION: This exam was performed according to the departmental dose-optimization program which includes automated exposure control, adjustment of the mA and/or kV according to patient size and/or use of iterative reconstruction technique. CONTRAST:  29m OMNIPAQUE IOHEXOL 350 MG/ML SOLN COMPARISON:  CT dated August 05, 2026 FINDINGS: Cardiovascular: Heart is the mildly enlarged. No acute pulmonary embolism through the proximal segmental pulmonary arteries. Evaluation of portions of the lungs are limited due to respiratory motion. No pericardial effusion. Mild LEFT-sided coronary artery atherosclerotic calcifications. Scattered atherosclerotic calcifications of the nonaneurysmal thoracic aorta. Mediastinum/Nodes: No axillary or mediastinal adenopathy. Visualized thyroid is unremarkable. Lungs/Pleura: No pleural effusion or pneumothorax. Mild bibasilar atelectasis. Scattered ground-glass opacities in the RIGHT upper lobe, likely infectious or inflammatory in etiology. Upper Abdomen: Morphologic changes of liver with widening of the fissure and suggestion of nodular contours. Peripheral calcifications of the gallbladder at the edge of the field of view. Musculoskeletal: Gynecomastia. Flowing anterior osteophytes with relative preservation of the disc space most consistent with underlying diffuse idiopathic skeletal hyperostosis. Review of the MIP images confirms the above findings. IMPRESSION: 1. No acute pulmonary embolism. 2. Morphologic changes of the liver could reflect underlying cirrhosis. 3. Peripheral calcifications of the gallbladder which could reflect porcelain gallbladder. 4. Scattered RIGHT upper lobe ground-glass opacities, likely infectious or  inflammatory in etiology. Aortic Atherosclerosis (ICD10-I70.0). Electronically Signed   By: SValentino SaxonM.D.   On: 12/19/2021 15:42    Microbiology: Results for orders placed or performed during the hospital encounter of 12/19/21  Blood culture (routine x 2)     Status: None (Preliminary result)   Collection Time: 12/19/21 12:47 PM   Specimen: BLOOD  Result Value Ref Range Status   Specimen Description BLOOD SITE NOT SPECIFIED  Final   Special Requests   Final    BOTTLES DRAWN AEROBIC ONLY Blood Culture adequate volume   Culture   Final    NO GROWTH 3 DAYS [facility]formed at [facility] [address], [place] [address]   Report Status PENDING  Incomplete  Blood culture (routine x 2)     Status: None (Preliminary result)   Collection Time: 12/19/21 12:52 PM   Specimen: BLOOD  Result Value Ref Range Status   Specimen Description BLOOD Blood Culture adequate volume  Final   Special Requests   Final    BOTTLES DRAWN AEROBIC ONLY Blood Culture adequate volume   Culture   Final    NO GROWTH 3 DAYS Performed at [facility] [address], [place] [address]   Report Status PENDING  Incomplete    Labs: CBC: Recent  Labs  Lab 12/19/21 1347 12/21/21 0528 12/22/21 0038  WBC 7.5 6.3 7.3  NEUTROABS 4.3  --   --   HGB 14.5 13.9 13.2  HCT 43.2 41.3 40.5  MCV 101.2* 100.5* 102.0*  PLT 224 188 356   Basic Metabolic Panel: Recent Labs  Lab 12/19/21 1347 12/20/21 0630 12/21/21 0528 12/22/21 0038  NA 135 135 137 138  K 3.9 3.8 3.5 3.6  CL 97* 97* 99 99  CO2 '26 26 27 29  '$ GLUCOSE 145* 188* 172* 214*  BUN 27* '23 20 21  '$ CREATININE 2.02* 1.92* 1.58* 1.58*  CALCIUM 9.1 9.0 9.0 9.1  MG  --   --  2.0 2.0   Liver Function Tests: Recent Labs  Lab 12/19/21 1347  AST 50*  ALT 43  ALKPHOS 53  BILITOT 1.2  PROT 7.5  ALBUMIN 3.5   CBG: Recent Labs  Lab 12/21/21 1143 12/21/21 1553 12/22/21 0009 12/22/21 0803 12/22/21 1124  GLUCAP 157*  176* 213* 196* 189*    Discharge time spent: greater than 30 minutes.  Signed: Flora Lipps, MD [facility] 12/22/2021

## 2021-12-22 NOTE — TOC Progression Note (Addendum)
Transition of Care [facility]) - Progression Note    Patient Details  Name: Ronnie Hernandez MRN: [id number] Date of Birth: [birth year]  [facility] CM/SW Contact  Jacalyn Lefevre Edson Snowball, RN Phone Number: 12/22/2021, 10:40 AM  Clinical Narrative:    Patient ready for discharge today.  Spoke to [place] from [place] yesterday. [facility] unable to transport patient home at discharge. Due to patient's weight they cannot transport in [place]. At discharge [facility] instructed NCM to call [facility] and ask for authorization  for [facility] .   NCM called [facility] on call number spoke to Myra ex[plained above . Myra will have on call MD call NCM back.   Dr Bradd Burner with [facility] called back and said [facility] transportation is approved.   Spoke to patient and niece. Patient does not have a key to get in apartment. Niece will bring key to patient today. Patient and niece requesting [facility] pick patient up 7 pm or later. [facility] paperwork in chart and [facility] called for 7 pm or later .     Expected Discharge Plan: Home/Self Care Barriers to Discharge: Continued Medical Work up  Expected Discharge Plan and Services Expected Discharge Plan: Home/Self Care   Discharge Planning Services: CM Consult   Living arrangements for the past 2 months: Apartment                 DME Arranged: N/A DME Agency: NA       HH Arranged: NA           Social Determinants of Health (SDOH) Interventions    Readmission Risk Interventions     No data to display

## 2021-12-24 LAB — CULTURE, BLOOD (ROUTINE X 2)
Culture: NO GROWTH
Culture: NO GROWTH
Special Requests: ADEQUATE
Special Requests: ADEQUATE
Specimen Description: ADEQUATE

## 2022-08-16 ENCOUNTER — Encounter: Payer: Self-pay | Admitting: *Deleted

## 2022-08-16 ENCOUNTER — Encounter (HOSPITAL_COMMUNITY): Payer: Self-pay | Admitting: Neurosurgery

## 2022-08-16 ENCOUNTER — Other Ambulatory Visit (HOSPITAL_COMMUNITY): Payer: Self-pay | Admitting: Internal Medicine

## 2022-08-16 DIAGNOSIS — N1831 Chronic kidney disease, stage 3a: Secondary | ICD-10-CM

## 2022-08-23 ENCOUNTER — Encounter: Payer: Self-pay | Admitting: Podiatry

## 2022-08-23 ENCOUNTER — Ambulatory Visit (INDEPENDENT_AMBULATORY_CARE_PROVIDER_SITE_OTHER): Payer: Medicare (Managed Care) | Admitting: Podiatry

## 2022-08-23 DIAGNOSIS — E1165 Type 2 diabetes mellitus with hyperglycemia: Secondary | ICD-10-CM

## 2022-08-23 DIAGNOSIS — M79674 Pain in right toe(s): Secondary | ICD-10-CM | POA: Diagnosis not present

## 2022-08-23 DIAGNOSIS — B351 Tinea unguium: Secondary | ICD-10-CM

## 2022-08-23 DIAGNOSIS — M79675 Pain in left toe(s): Secondary | ICD-10-CM | POA: Diagnosis not present

## 2022-08-23 NOTE — Progress Notes (Signed)
  Subjective:  Patient ID: Ronnie Hernandez, male    DOB: 04/28/1961,   MRN: [id number]  Chief Complaint  Patient presents with   Diabetes    Diabetic foot care    62 y.o. male presents for concern of thickened elongated and painful nails that are difficult to trim. Requesting to have them trimmed today. Relates burning and tingling in their feet. Patient is diabetic and last A1c was  Lab Results  Component Value Date   HGBA1C 7.2 (H) 12/20/2021   . Also relates a sore on the top of his right foot that has been present for a while and has been keeping a bandage on it.    PCP:  [facility]    . Denies any other pedal complaints. Denies n/v/f/c.   Past Medical History:  Diagnosis Date   Arthritis    "knees, hands" (04/19/2017)   Cellulitis 05/2020   bilateral legs   Childhood asthma    Diabetes type 2, uncontrolled    Gout    "have had it in both feet, both hands, right shoulder, back" (04/19/2017)   Hyperlipidemia    Hypertension    Morbidly obese ([place])    Pulmonary embolism ([place]) 2017?    Objective:  Physical Exam: Vascular: DP/PT pulses 2/4 bilateral. CFT <3 seconds. Absent hair growth on digits. Edema noted to bilateral lower extremities. Xerosis noted bilaterally.  Skin. No lacerations or abrasions bilateral feet. Nails 1-5 bilateral  are thickened discolored and elongated with subungual debris. Dorsal first metatartarsal with erythema but no ulceration likely from rubbing in shoe on spurring.  Musculoskeletal: MMT 5/5 bilateral lower extremities in DF, PF, Inversion and Eversion. Deceased ROM in DF of ankle joint. No ROM of the first MPJ and spurring noted to first MPJ.  Neurological: Sensation intact to light touch. Protective sensation diminished bilateral.    Assessment:   1. Pain due to onychomycosis of toenails of both feet   2. Type 2 diabetes mellitus with hyperglycemia, without long-term current use of insulin ([place])       Plan:  Patient was evaluated and treated and all questions answered. -Discussed and educated patient on diabetic foot care, especially with  regards to the vascular, neurological and musculoskeletal systems.  -Stressed the importance of good glycemic control and the detriment of not  controlling glucose levels in relation to the foot. -Discussed supportive shoes at all times and checking feet regularly.  -Mechanically debrided all nails 1-5 bilateral using sterile nail nipper and filed with dremel without incident  -Answered all patient questions -Patient to return  in 3 months for at risk foot care -Patient advised to call the office if any problems or questions arise in the meantime.   Lorenda Peck, DPM

## 2022-08-25 ENCOUNTER — Ambulatory Visit (HOSPITAL_COMMUNITY)
Admission: RE | Admit: 2022-08-25 | Discharge: 2022-08-25 | Disposition: A | Discharge status: Home / Self Care | Payer: Medicare (Managed Care) | Source: Ambulatory Visit | Attending: Internal Medicine | Admitting: Internal Medicine

## 2022-08-25 DIAGNOSIS — N1831 Chronic kidney disease, stage 3a: Secondary | ICD-10-CM | POA: Diagnosis present

## 2022-08-25 DIAGNOSIS — E1122 Type 2 diabetes mellitus with diabetic chronic kidney disease: Secondary | ICD-10-CM | POA: Insufficient documentation

## 2022-11-09 ENCOUNTER — Other Ambulatory Visit (HOSPITAL_COMMUNITY): Payer: Self-pay | Admitting: Internal Medicine

## 2022-11-09 DIAGNOSIS — T1490XA Injury, unspecified, initial encounter: Secondary | ICD-10-CM

## 2022-11-09 DIAGNOSIS — R600 Localized edema: Secondary | ICD-10-CM

## 2022-11-10 ENCOUNTER — Other Ambulatory Visit (HOSPITAL_COMMUNITY): Payer: Self-pay | Admitting: Internal Medicine

## 2022-11-10 DIAGNOSIS — R600 Localized edema: Secondary | ICD-10-CM

## 2022-11-16 ENCOUNTER — Ambulatory Visit (HOSPITAL_COMMUNITY)
Admission: RE | Admit: 2022-11-16 | Discharge: 2022-11-16 | Disposition: A | Discharge status: Home / Self Care | Payer: Medicare (Managed Care) | Source: Ambulatory Visit | Attending: Internal Medicine | Admitting: Internal Medicine

## 2022-11-16 DIAGNOSIS — R600 Localized edema: Secondary | ICD-10-CM | POA: Diagnosis present

## 2022-11-22 ENCOUNTER — Ambulatory Visit: Payer: Medicare (Managed Care) | Admitting: Podiatry

## 2022-11-24 ENCOUNTER — Encounter: Payer: Self-pay | Admitting: Podiatry

## 2022-11-24 ENCOUNTER — Ambulatory Visit (INDEPENDENT_AMBULATORY_CARE_PROVIDER_SITE_OTHER): Payer: Medicare (Managed Care) | Admitting: Podiatry

## 2022-11-24 DIAGNOSIS — M79675 Pain in left toe(s): Secondary | ICD-10-CM | POA: Diagnosis not present

## 2022-11-24 DIAGNOSIS — M205X2 Other deformities of toe(s) (acquired), left foot: Secondary | ICD-10-CM

## 2022-11-24 DIAGNOSIS — M205X1 Other deformities of toe(s) (acquired), right foot: Secondary | ICD-10-CM | POA: Diagnosis not present

## 2022-11-24 DIAGNOSIS — B351 Tinea unguium: Secondary | ICD-10-CM | POA: Diagnosis not present

## 2022-11-24 DIAGNOSIS — E1165 Type 2 diabetes mellitus with hyperglycemia: Secondary | ICD-10-CM

## 2022-11-24 DIAGNOSIS — M79674 Pain in right toe(s): Secondary | ICD-10-CM

## 2022-11-24 NOTE — Progress Notes (Signed)
This patient returns to my office for at risk foot care.  This patient requires this care by a professional since this patient will be at risk due to having type 2 diabetes.  This patient is unable to cut nails himself since the patient cannot reach his nails.These nails are painful walking and wearing shoes.  He is also wearing a bandage over the top of his big toe joint right foot. Which he applied.This patient presents for at risk foot care today.  General Appearance  Alert, conversant and in no acute stress.  Vascular  Dorsalis pedis and posterior tibial  pulses are not  palpable due to swelling. bilaterally.  Capillary return is within normal limits  bilaterally. Temperature is within normal limits  bilaterally.  Neurologic  Senn-Weinstein monofilament wire test within normal limits  bilaterally. Muscle power within normal limits bilaterally.  Nails Thick disfigured discolored nails with subungual debris  from hallux to fifth toes bilaterally. No evidence of bacterial infection or drainage bilaterally.  Orthopedic  No limitations of motion  feet .  No crepitus or effusions noted.  Hallux limitus 1st MPJ  B/L with right greater than left.  Exostosis dorsum head 1st metatarsal right foot.  Skin  normotropic skin with no porokeratosis noted bilaterally.  No signs of infections or ulcers noted.     Onychomycosis  Pain in right toes  Pain in left toes  Hallux limitus 1st MPJ  B/L  Consent was obtained for treatment procedures.   Mechanical debridement of nails 1-5  bilaterally performed with a nail nipper.  Filed with dremel without incident. Evaluated his right foot which revealed significant hallux limitus right foot. Discussed condition with this patient.     Return office visit  3 months                   Told patient to return for periodic foot care and evaluation due to potential at risk complications.   Helane Gunther DPM

## 2023-01-26 ENCOUNTER — Ambulatory Visit: Payer: Medicare (Managed Care) | Admitting: Physician Assistant

## 2023-01-26 ENCOUNTER — Encounter: Payer: Self-pay | Admitting: Physician Assistant

## 2023-01-26 ENCOUNTER — Other Ambulatory Visit (INDEPENDENT_AMBULATORY_CARE_PROVIDER_SITE_OTHER): Payer: Medicare (Managed Care)

## 2023-01-26 VITALS — BP 130/80 | Ht 66.0 in | Wt >= 6400 oz

## 2023-01-26 DIAGNOSIS — R932 Abnormal findings on diagnostic imaging of liver and biliary tract: Secondary | ICD-10-CM | POA: Diagnosis not present

## 2023-01-26 DIAGNOSIS — K7460 Unspecified cirrhosis of liver: Secondary | ICD-10-CM

## 2023-01-26 LAB — CBC WITH DIFFERENTIAL/PLATELET
Basophils Absolute: 0.1 10*3/uL (ref 0.0–0.1)
Basophils Relative: 1.2 % (ref 0.0–3.0)
Eosinophils Absolute: 0.3 10*3/uL (ref 0.0–0.7)
Eosinophils Relative: 3.5 % (ref 0.0–5.0)
HCT: 47.3 % (ref 39.0–52.0)
Hemoglobin: 15.1 g/dL (ref 13.0–17.0)
Lymphocytes Relative: 29.2 % (ref 12.0–46.0)
Lymphs Abs: 2.6 10*3/uL (ref 0.7–4.0)
MCHC: 31.9 g/dL (ref 30.0–36.0)
MCV: 101.5 fl — ABNORMAL HIGH (ref 78.0–100.0)
Monocytes Absolute: 0.4 10*3/uL (ref 0.1–1.0)
Monocytes Relative: 5 % (ref 3.0–12.0)
Neutro Abs: 5.4 10*3/uL (ref 1.4–7.7)
Neutrophils Relative %: 61.1 % (ref 43.0–77.0)
Platelets: 239 10*3/uL (ref 150.0–400.0)
RBC: 4.66 Mil/uL (ref 4.22–5.81)
RDW: 14.1 % (ref 11.5–15.5)
WBC: 8.9 10*3/uL (ref 4.0–10.5)

## 2023-01-26 LAB — COMPREHENSIVE METABOLIC PANEL
ALT: 37 U/L (ref 0–53)
AST: 34 U/L (ref 0–37)
Albumin: 3.9 g/dL (ref 3.5–5.2)
Alkaline Phosphatase: 90 U/L (ref 39–117)
BUN: 25 mg/dL — ABNORMAL HIGH (ref 6–23)
CO2: 28 mEq/L (ref 19–32)
Calcium: 9.4 mg/dL (ref 8.4–10.5)
Chloride: 100 mEq/L (ref 96–112)
Creatinine, Ser: 1.46 mg/dL (ref 0.40–1.50)
GFR: 51.12 mL/min — ABNORMAL LOW (ref >60.00)
Glucose, Bld: 154 mg/dL — ABNORMAL HIGH (ref 70–99)
Potassium: 3.8 mEq/L (ref 3.5–5.1)
Sodium: 137 mEq/L (ref 135–145)
Total Bilirubin: 0.4 mg/dL (ref 0.2–1.2)
Total Protein: 8.2 g/dL (ref 6.0–8.3)

## 2023-01-26 LAB — PROTIME-INR
INR: 1 ratio (ref 0.8–1.0)
Prothrombin Time: 10.7 s (ref 9.6–13.1)

## 2023-01-26 LAB — FERRITIN: Ferritin: 193.7 ng/mL (ref 22.0–322.0)

## 2023-01-26 NOTE — Patient Instructions (Addendum)
_______________________________________________________  If your blood pressure at your visit was 140/90 or greater, please contact your primary care physician to follow up on this.  If you are age 63 or younger, your body mass index should be between 19-25. Your Body mass index is 70.7 kg/m. If this is out of the aformentioned range listed, please consider follow up with your Primary Care Provider.  ________________________________________________________  The [facility] providers would like to encourage you to use [facility] to communicate with providers for non-urgent requests or questions.  Due to long hold times on the telephone, sending your provider a message by [facility] may be a faster and more efficient way to get a response.  Please allow 48 business hours for a response.  Please remember that this is for non-urgent requests.  _______________________________________________________  Your provider has requested that you go to the basement level for lab work before leaving today. Press "B" on the elevator. The lab is located at the first door on the left as you exit the elevator.  Due to recent changes in healthcare laws, you may see the results of your imaging and laboratory studies on MyChart before your provider has had a chance to review them.  We understand that in some cases there may be results that are confusing or concerning to you. Not all laboratory results come back in the same time frame and the provider may be waiting for multiple results in order to interpret others.  Please give [place] 48 hours in order for your provider to thoroughly review all the results before contacting the office for clarification of your results.   You are scheduled to follow up with Dr Rhea Belton on 05-31-23 at 2:10pm.  Thank you for entrusting me with your care and choosing [facility].  Amy Esterwood, PA-C

## 2023-01-26 NOTE — Progress Notes (Signed)
Subjective:    Patient ID: Ronnie Hernandez, male    DOB: 1961-05-03, 62 y.o.   MRN: [id number]  HPI Ronnie Hernandez is a pleasant 62 year old African-American male, established with Dr. Rhea Belton who is referred back today by [facility] for evaluation of possible cirrhosis. Patient had upper abdominal ultrasound done on 08/25/2022 which did reveal cholelithiasis, and there may be a portion of the gallbladder wall which is calcified, no gallbladder wall thickening or pericholecystic fluid, CBD 7 mm, increased echogenicity and nodular contour of the liver concerning for cirrhosis, and a 2.1 cm cyst in the right hepatic lobe.  Patient had previously been seen here for colonoscopies, his last exam was in 2013 and he had 2 nonadvanced adenomas at that time.  When he was last seen in February 2023 decision was made by Dr. Rhea Belton not to pursue any further surveillance colonoscopies due to his comorbidities of severe morbid obesity, congestive heart failure etc.  Patient has not been aware of any prior issues with his liver.  He is not having any problems with abdominal pain or discomfort.  He is not aware of any prior hepatitis.  He mentions that he was exposed to a caregiver who had some sort of infection several years ago and he had to take some pills for that but cannot remember the details.  No history of chronic EtOH abuse. He believes that there were some family members who had cirrhosis when they were elderly.  I do not have any recent labs.  Other medical problems include hyperlipidemia, hypertension, severe morbid obesity with BMI of 70, polyarthritis, history of respiratory failure, congestive heart failure, prior history of PE and adult onset diabetes mellitus.   Review of Systems Pertinent positive and negative review of systems were noted in the above HPI section.  All other review of systems was otherwise negative.   Outpatient Encounter Medications as of 01/26/2023  Medication Sig    albuterol (VENTOLIN HFA) 108 (90 Base) MCG/ACT inhaler Inhale 1-2 puffs into the lungs every 6 (six) hours as needed for wheezing or shortness of breath.   allopurinol (ZYLOPRIM) 300 MG tablet Take 1 tablet (300 mg total) by mouth 2 (two) times daily. (Patient taking differently: Take 800 mg by mouth daily.)   ascorbic acid (VITAMIN C) 500 MG tablet Take 500 mg by mouth daily.   atorvastatin (LIPITOR) 80 MG tablet Take 80 mg by mouth daily.   canagliflozin (INVOKANA) 300 MG TABS tablet Take 300 mg by mouth daily.   Cholecalciferol (VITAMIN D3) 50 MCG (2000 UT) CAPS Take 2,000 Units by mouth daily.   colchicine 0.6 MG tablet Take 1 tablet (0.6 mg total) by mouth daily. (Patient taking differently: Take 0.6 mg by mouth 2 (two) times daily.)   fenofibrate 160 MG tablet Take 160 mg by mouth daily.   fluticasone (FLONASE) 50 MCG/ACT nasal spray Place 1 spray into both nostrils daily as needed for allergies or rhinitis.   furosemide (LASIX) 40 MG tablet Take one tablet by mouth once daily   levothyroxine (SYNTHROID) 112 MCG tablet Take 112 mcg by mouth daily before breakfast.   linagliptin (TRADJENTA) 5 MG TABS tablet Take 5 mg by mouth daily.   losartan (COZAAR) 25 MG tablet Take 1 tablet (25 mg total) by mouth daily.   magnesium chloride (SLOW-MAG) 64 MG TBEC SR tablet Take 1 tablet by mouth daily as needed (muscle relaxation).   menthol-zinc oxide (GOLD BOND) powder Apply topically See admin instructions. Apply topically twice  daily to abdominal folds.   metFORMIN (GLUCOPHAGE) 500 MG tablet Take 2 tablets (1,000 mg total) by mouth 2 (two) times daily with a meal.   Tacrolimus 0.1 % CREA Apply topically.   triamcinolone ointment (KENALOG) 0.1 % Apply 1 Application topically 2 (two) times daily. Alternate with tacrolimus two weeks on two weeks off   UNABLE TO FIND bariatric drop arm commode Dx) R54, E66.01, I50.9, M19.9   UNABLE TO FIND Bariatric tub transfer bench Dx) R54, E66.01, I50.9, M19.9    UNABLE TO FIND Bariatric lift chair. Dx) R54, E66.01, I50.9, M19.9   UNABLE TO FIND Heavy duty Cane Dx) R54, E66.01, I50.9, M19.9   No facility-administered encounter medications on file as of 01/26/2023.   Allergies  Allergen Reactions   Uloric [Febuxostat] Other (See Comments)    Made the skin on feet and legs "hurt"   Patient Active Problem List   Diagnosis Date Noted   Acquired hallux limitus of both feet 11/24/2022   CHF (congestive heart failure) (HCC) 12/20/2021   AKI (acute kidney injury) (HCC) 12/19/2021   Chronic diastolic CHF (congestive heart failure) (HCC) 12/19/2021   Cellulitis 06/05/2020   Acute on chronic heart failure with preserved ejection fraction (HFpEF) (HCC)    Bilateral lower extremity edema 06/03/2020   Polyarthritis 08/17/2019   Physical deconditioning 08/16/2019   Pressure injury of skin 08/16/2019   Gout attack 08/15/2019   Moderate persistent asthma with exacerbation    Acute respiratory failure with hypoxia (HCC) 08/26/2018   Acute diastolic CHF (congestive heart failure) (HCC) 08/26/2018   Left knee pain 03/31/2018   Knee pain    Acute gout 04/18/2017   Pulmonary embolus (HCC) 08/05/2016   Hyperglycemia, drug-induced 07/08/2016   Impaired glucose tolerance 07/08/2016   Leukocytosis 07/06/2016   Unable to walk 07/06/2016   Acute gouty arthritis 07/06/2016   Abnormality of gait 02/18/2016   Cellulitis of left lower extremity 01/27/2016   Type 2 diabetes mellitus (HCC) 12/25/2013   Drug-induced gout 12/25/2013   Hyperlipidemia 08/01/2013   Morbid obesity (HCC)    Hypertension    Gout    Colon cancer screening 07/27/2011   Benign neoplasm of colon 07/27/2011   Social History   Socioeconomic History   Marital status: Single    Spouse name: Not on file   Number of children: Not on file   Years of education: Not on file   Highest education level: Not on file  Occupational History   Not on file  Tobacco Use   Smoking status: Former     Current packs/day: 0.00    Types: Cigarettes    Quit date: 02/09/1982    Years since quitting: 40.9   Smokeless tobacco: Never  Vaping Use   Vaping status: Never Used  Substance and Sexual Activity   Alcohol use: No   Drug use: Not Currently    Comment: 04/19/2017 "nothing since I was 21"   Sexual activity: Never  Other Topics Concern   Not on file  Social History Narrative   Not on file   Social Determinants of Health   Financial Resource Strain: Low Risk  (09/15/2017)   Overall Financial Resource Strain (CARDIA)    Difficulty of Paying Living Expenses: Not hard at all  Food Insecurity: No Food Insecurity (09/15/2017)   Hunger Vital Sign    Worried About Running Out of Food in the Last Year: Never true    Ran Out of Food in the Last Year: Never true  Transportation Needs:  No Transportation Needs (09/15/2017)   PRAPARE - Administrator, Civil Service (Medical): No    Lack of Transportation (Non-Medical): No  Physical Activity: Inactive (09/15/2017)   Exercise Vital Sign    Days of Exercise per Week: 0 days    Minutes of Exercise per Session: 0 min  Stress: No Stress Concern Present (09/15/2017)   [employer] of Occupational Health - Occupational Stress Questionnaire    Feeling of Stress : Not at all  Social Connections: Somewhat Isolated (09/15/2017)   Social Connection and Isolation Panel [NHANES]    Frequency of Communication with Friends and Family: More than three times a week    Frequency of Social Gatherings with Friends and Family: More than three times a week    Attends Religious Services: More than 4 times per year    Active Member of [employer] or Organizations: No    Attends Banker Meetings: Never    Marital Status: Never married  Intimate Partner Violence: Not At Risk (09/15/2017)   Humiliation, Afraid, Rape, and Kick questionnaire    Fear of Current or Ex-Partner: No    Emotionally Abused: No    Physically Abused: No    Sexually Abused:  No    Mr. Tippett's family history includes Colon cancer (age of onset: 37) in his maternal grandfather; Heart attack in his father; Stomach cancer (age of onset: 67) in his maternal aunt.      Objective:    Vitals:   01/26/23 1052  BP: 130/80    Physical Exam Well-developed morbidly obese African-American male in no acute distress.,  In a motorized wheelchair   Weight for 38, BMI 70.7  HEENT; nontraumatic normocephalic, EOMI, PE R LA, sclera anicteric. Oropharynx; not examined today Neck; supple, no JVD Cardiovascular; distant, regular rate and rhythm with S1-S2, no murmur rub or gallop Pulmonary; Clear bilaterally, but decreased bilaterally Abdomen; soft, morbidly obese, nondistended, no palpable mass or hepatosplenomegaly, bowel sounds are active, exam limited by body habitus Rectal; not done today Skin; benign exam, no jaundice rash or appreciable lesions Extremities; bilateral chronic stasis changes and edema Neuro/Psych; alert and oriented x4, grossly nonfocal mood and affect appropriate        Assessment & Plan:   #1 62 year old African-American male with recent ultrasound showing probable cirrhosis with increased hepatic echogenicity and nodular contour-referred for evaluation of probable cirrhosis  No recent labs available today No splenomegaly mentioned on ultrasound Patient has multiple risk factors for nonalcoholic fatty liver disease MASLD) with severe morbid obesity, diabetes, and hyperlipidemia Will rule out chronic hepatitis/viral, and rule out autoimmune disease, hemochromatosis  As there is no mention of splenomegaly or ascites, hopefully he has compensated cirrhosis at this time.  #2 severe morbid obesity with BMI of 70.7/current weight 438,, wheelchair-bound #3 adult onset diabetes mellitus #4.  Polyarthritis #5.  Congestive heart failure, history of respiratory failure #6.  Hypertension #7.  Hyperlipidemia #8 history of nonadvanced adenomatous colon  polyps-last colonoscopy 2013, do not plan further surveillance colonoscopies due to significant comorbidities #9 cholelithiasis  Plan; CBC with differential, c-Met, pro time/INR Ferritin/AFP/ANA/chronic hepatitis serologies   Further recommendations pending results of above, will arrange for office follow-up with Dr. Rhea Belton or myself.    S  PA-C 01/26/2023   Cc: [employer], [employer]*

## 2023-02-04 NOTE — Progress Notes (Signed)
Addendum: Reviewed and agree with assessment and management plan. Pyrtle, Jay M, MD  

## 2023-02-24 ENCOUNTER — Ambulatory Visit: Payer: Medicare (Managed Care) | Admitting: Podiatry

## 2023-03-10 ENCOUNTER — Encounter: Payer: Medicare (Managed Care) | Admitting: *Deleted

## 2023-03-10 VITALS — BP 117/55 | HR 74 | Temp 98.0°F | Resp 18 | Ht 66.0 in | Wt >= 6400 oz

## 2023-03-10 DIAGNOSIS — Z006 Encounter for examination for normal comparison and control in clinical research program: Secondary | ICD-10-CM

## 2023-03-10 NOTE — Research (Cosign Needed Addendum)
AA HEART  Informed Consent   Subject Name: Ronnie Hernandez  Subject met inclusion and exclusion criteria.  The informed consent form, study requirements and expectations were reviewed with the subject and questions and concerns were addressed prior to the signing of the consent form.  The subject verbalized understanding of the trial requirements.  The subject agreed to participate in the AA HEART  trial and signed the informed consent at 10:06 AM on 09-16.  The informed consent was obtained prior to performance of any protocol-specific procedures for the subject.  A copy of the signed informed consent was given to the subject and a copy was placed in the subject's medical record.   [place] Nioma Mccubbins, Research Coordinator+    Are there any labs that are clinically significant?  Yes []  OR No[]    ACCESSION NO. [id number]                                             Page 1 of 1                                                        INVESTIGATOR: ([id number])                          PROTOCOL   [id number]                     Thomasene Ripple, M.D.                              INVESTIGATOR NO.: [id number]                     c/o [facility]                         SUBJECT NUMBER: [id number]                     [place]                 SUBJECT INITIALS NOT COLLECTED:                     [address]                          VISIT: D0                     [place], [place] 0                   SPONSOR REPORT TO:                 COLLECTION TIME:11:30 DATE:10-Mar-2023                     Cruzita Lederer                 DATE RECEIVED IN LABORATORY: 11-Mar-2023  c/o Sponsor(or Addtl) [facility].Study DATE REPORTED BY LABORATORY: 11-Mar-2023                     [facility]                          SEX: M  AGE: [id number]                     [id number]                   [place], [place] [phone]                                                                                         Ref. Ranges               Clinical    Comments                                                                          Significance                                                                            Yes*  No                    HEMATOLOGY&DIFFERENTIAL PANEL                      HGB            15.0         12.5-17.0 g/dL                      HCT            46           37-51 %                      RBC            4.4          4.0-5.8 x106/uL                      MCH            34           26-34 pg                      MCHC  33           31-38 g/dL                      RDW            14.0         12.0-15.0 %                      RBC Morph      No Review Required                        MCV            103     H    80-100 fL                      WBC            7.23         3.80-10.70 x103/uL                      Neutrophil     4.02         1.96-7.23 x103/uL                      Lymphocyte     2.70         0.80-3.00 x103/uL                      Monocytes      0.22         0.12-0.92 x103/uL                      Eosinophil     0.26         0.00-0.57 x103/uL                      Basophils      0.02         0.00-0.20 x103/uL                      Neutrophil     55.6         40.5-75.0 %                      Lymphocyte     37.5         15.4-48.5%                      Monocytes      3.0          2.6-10.1 %                      Eosinophil     3.6          0.0-6.8 %                      Basophils      0.3          0.0-2.0 %        RETICULOCYTES                      Retic %        1.0  0.6-2.5 %                      Retic Abs      0.045        0.030-0.130 x106/uL                Platelets      213          130-394 x103/uL  ANC                      ANC            4.02         1.96-7.23 x103/uL                    HBA1C                      Fast HbA1c     8.1     H    <6.5%

## 2023-03-10 NOTE — Progress Notes (Signed)
Delightful 62 year old gentleman here for enrollment in AA Heart Registry Study.  He has several issues, including morbid obesity, prior PE, and HFpEF.  He is seen at [facility], and previously was followed by Dr. Bjorn Pippin.  He also has RA, gout, and DM.  Last venous dopplers were negative but technically limited.  Screening nature of study described to patient in detail.  He does sleep poorly.   Alert, oriented male in NAD BP  117/55  P 74  T 98 R 18 Thick neck, leaning back in chair due to obesity.  Cannot hear carotids, no visible JVD Lungs clear Cor reg Abd obese.  No masses felt Ext -  extensive brawny edema with skin changes, hard to assess edema.  Pulses not palpable due to skin depth.  Hands demonstrate ulnar deviation, swan defects, and typical RA changes.  Also what appears to be gout lesion.  Neuro - non focal.  Alert, engaged, can speak on all types of sports facts.   Impression:   AA Heart enrollment  No known CAD  Likely OSA and diastolic dysfunction  LE brawny edema  RA and gout   Consent obtained and blood work in progress.   Arturo Morton. Riley Kill, MD, [facility] Medical Director, [facility]

## 2023-05-31 ENCOUNTER — Ambulatory Visit: Payer: Self-pay | Admitting: Internal Medicine

## 2023-06-18 ENCOUNTER — Emergency Department (HOSPITAL_COMMUNITY): Payer: Medicare (Managed Care)

## 2023-06-18 ENCOUNTER — Encounter (HOSPITAL_COMMUNITY): Payer: Self-pay | Admitting: *Deleted

## 2023-06-18 ENCOUNTER — Emergency Department (HOSPITAL_BASED_OUTPATIENT_CLINIC_OR_DEPARTMENT_OTHER): Payer: Medicare (Managed Care)

## 2023-06-18 ENCOUNTER — Other Ambulatory Visit: Payer: Self-pay

## 2023-06-18 ENCOUNTER — Emergency Department (HOSPITAL_COMMUNITY)
Admission: EM | Admit: 2023-06-18 | Discharge: 2023-06-18 | Disposition: A | Discharge status: Home / Self Care | Payer: Medicare (Managed Care) | Attending: Emergency Medicine | Admitting: Emergency Medicine

## 2023-06-18 DIAGNOSIS — E119 Type 2 diabetes mellitus without complications: Secondary | ICD-10-CM | POA: Diagnosis not present

## 2023-06-18 DIAGNOSIS — I509 Heart failure, unspecified: Secondary | ICD-10-CM | POA: Diagnosis not present

## 2023-06-18 DIAGNOSIS — Z7984 Long term (current) use of oral hypoglycemic drugs: Secondary | ICD-10-CM | POA: Diagnosis not present

## 2023-06-18 DIAGNOSIS — R600 Localized edema: Secondary | ICD-10-CM | POA: Diagnosis not present

## 2023-06-18 DIAGNOSIS — Z79899 Other long term (current) drug therapy: Secondary | ICD-10-CM | POA: Insufficient documentation

## 2023-06-18 DIAGNOSIS — J45909 Unspecified asthma, uncomplicated: Secondary | ICD-10-CM | POA: Diagnosis not present

## 2023-06-18 DIAGNOSIS — I110 Hypertensive heart disease with heart failure: Secondary | ICD-10-CM | POA: Insufficient documentation

## 2023-06-18 DIAGNOSIS — M7989 Other specified soft tissue disorders: Secondary | ICD-10-CM

## 2023-06-18 DIAGNOSIS — Z7951 Long term (current) use of inhaled steroids: Secondary | ICD-10-CM | POA: Insufficient documentation

## 2023-06-18 DIAGNOSIS — R0602 Shortness of breath: Secondary | ICD-10-CM | POA: Insufficient documentation

## 2023-06-18 DIAGNOSIS — R079 Chest pain, unspecified: Secondary | ICD-10-CM | POA: Diagnosis present

## 2023-06-18 LAB — BRAIN NATRIURETIC PEPTIDE: B Natriuretic Peptide: 49.5 pg/mL (ref 0.0–100.0)

## 2023-06-18 LAB — CBC
HCT: 39.8 % (ref 39.0–52.0)
Hemoglobin: 12.7 g/dL — ABNORMAL LOW (ref 13.0–17.0)
MCH: 33.1 pg (ref 26.0–34.0)
MCHC: 31.9 g/dL (ref 30.0–36.0)
MCV: 103.6 fL — ABNORMAL HIGH (ref 80.0–100.0)
Platelets: 167 10*3/uL (ref 150–400)
RBC: 3.84 MIL/uL — ABNORMAL LOW (ref 4.22–5.81)
RDW: 12.7 % (ref 11.5–15.5)
WBC: 6.3 10*3/uL (ref 4.0–10.5)
nRBC: 0 % (ref 0.0–0.2)

## 2023-06-18 LAB — BASIC METABOLIC PANEL
Anion gap: 13 (ref 5–15)
BUN: 23 mg/dL (ref 8–23)
CO2: 22 mmol/L (ref 22–32)
Calcium: 8.6 mg/dL — ABNORMAL LOW (ref 8.9–10.3)
Chloride: 102 mmol/L (ref 98–111)
Creatinine, Ser: 1.49 mg/dL — ABNORMAL HIGH (ref 0.61–1.24)
GFR, Estimated: 52 mL/min — ABNORMAL LOW (ref >60)
Glucose, Bld: 147 mg/dL — ABNORMAL HIGH (ref 70–99)
Potassium: 4 mmol/L (ref 3.5–5.1)
Sodium: 137 mmol/L (ref 135–145)

## 2023-06-18 LAB — TROPONIN I (HIGH SENSITIVITY)
Troponin I (High Sensitivity): 5 ng/L (ref <18)
Troponin I (High Sensitivity): 5 ng/L (ref <18)

## 2023-06-18 MED ORDER — FUROSEMIDE 10 MG/ML IJ SOLN
40.0000 mg | Freq: Once | INTRAMUSCULAR | Status: AC
  Administered 2023-06-18: 40 mg via INTRAVENOUS
  Filled 2023-06-18: qty 4

## 2023-06-18 NOTE — Progress Notes (Signed)
VASCULAR LAB    Bilateral lower extremity venous duplex has been performed.  See CV proc for preliminary results.   Chauncey Sciulli, RVT 06/18/2023, 12:01 PM

## 2023-06-18 NOTE — ED Provider Notes (Signed)
[place] EMERGENCY DEPARTMENT AT [facility] Provider Note   CSN: [id number] Arrival date & time: 06/18/23  1036     History  Chief Complaint  Patient presents with   Chest Pain    Ronnie Hernandez is a 62 y.o. male with past medical history significant for morbid obesity, BMI 71, hyperlipidemia, diabetes, previous pulmonary embolus, CHF, asthma, gout, hypertension who presents concern for chest pain intermittently since 12/24.  Pain worse with movement, bending.  He reports shortness of breath with exertion.  He noticed more fluid in his lower extremities, left greater than right.  Additionally endorse some swelling of his abdomen.  He reports he has been taking his fluid pill as directed without improvement.   Chest Pain      Home Medications Prior to Admission medications   Medication Sig Start Date End Date Taking? Authorizing Provider  albuterol (VENTOLIN HFA) 108 (90 Base) MCG/ACT inhaler Inhale 1-2 puffs into the lungs every 6 (six) hours as needed for wheezing or shortness of breath.    [provider]  allopurinol (ZYLOPRIM) 300 MG tablet Take 1 tablet (300 mg total) by mouth 2 (two) times daily. Patient taking differently: Take 800 mg by mouth daily. 08/27/19   Sharon Seller, NP  ascorbic acid (VITAMIN C) 500 MG tablet Take 500 mg by mouth daily.    [provider]  atorvastatin (LIPITOR) 80 MG tablet Take 80 mg by mouth daily.    [provider]  canagliflozin (INVOKANA) 300 MG TABS tablet Take 300 mg by mouth daily.    [provider]  Cholecalciferol (VITAMIN D3) 50 MCG (2000 UT) CAPS Take 2,000 Units by mouth daily.    [provider]  colchicine 0.6 MG tablet Take 1 tablet (0.6 mg total) by mouth daily. Patient taking differently: Take 0.6 mg by mouth 2 (two) times daily. 04/04/18   Burnadette Pop, MD  fenofibrate 160 MG tablet Take 160 mg by mouth daily.    [provider]  fluticasone  (FLONASE) 50 MCG/ACT nasal spray Place 1 spray into both nostrils daily as needed for allergies or rhinitis.    [provider]  furosemide (LASIX) 40 MG tablet Take one tablet by mouth once daily 12/22/21   Pokhrel, Rebekah Chesterfield, MD  levothyroxine (SYNTHROID) 112 MCG tablet Take 112 mcg by mouth daily before breakfast.    [provider]  linagliptin (TRADJENTA) 5 MG TABS tablet Take 5 mg by mouth daily.    [provider]  losartan (COZAAR) 25 MG tablet Take 1 tablet (25 mg total) by mouth daily. 12/24/21   Pokhrel, Rebekah Chesterfield, MD  magnesium chloride (SLOW-MAG) 64 MG TBEC SR tablet Take 1 tablet by mouth daily as needed (muscle relaxation).    [provider]  menthol-zinc oxide (GOLD BOND) powder Apply topically See admin instructions. Apply topically twice daily to abdominal folds.    [provider]  metFORMIN (GLUCOPHAGE) 500 MG tablet Take 2 tablets (1,000 mg total) by mouth 2 (two) times daily with a meal. 12/24/21   Pokhrel, Laxman, MD  Tacrolimus 0.1 % CREA Apply topically.    [provider]  triamcinolone ointment (KENALOG) 0.1 % Apply 1 Application topically 2 (two) times daily. Alternate with tacrolimus two weeks on two weeks off    [provider]  UNABLE TO FIND bariatric drop arm commode Dx) R54, E66.01, I50.9, M19.9 07/09/19   Sharon Seller, NP  UNABLE TO FIND Bariatric tub transfer bench Dx) R54, E66.01,  I50.9, M19.9 07/09/19   Sharon Seller, NP  UNABLE TO FIND Bariatric lift chair. Dx) R54, E66.01, I50.9, M19.9 07/09/19   Sharon Seller, NP  UNABLE TO FIND Heavy duty Cane Dx) R54, E66.01, I50.9, M19.9 07/09/19   Sharon Seller, NP      Allergies    Uloric [febuxostat]    Review of Systems   Review of Systems  Cardiovascular:  Positive for chest pain.  All other systems reviewed and are negative.   Physical Exam Updated Vital Signs BP (!) 115/97   Pulse 73   Temp 97.6 F (36.4 C) (Oral)   Resp 15   Ht 5'  6" (1.676 m)   Wt (!) 200.5 kg   SpO2 99%   BMI 71.34 kg/m  Physical Exam Vitals and nursing note reviewed.  Constitutional:      General: He is not in acute distress.    Appearance: Normal appearance. He is obese.  HENT:     Head: Normocephalic and atraumatic.  Eyes:     General:        Right eye: No discharge.        Left eye: No discharge.  Cardiovascular:     Rate and Rhythm: Normal rate and regular rhythm.     Heart sounds: No murmur heard.    No friction rub. No gallop.  Pulmonary:     Effort: Pulmonary effort is normal.     Breath sounds: Normal breath sounds.  Abdominal:     General: Bowel sounds are normal.     Palpations: Abdomen is soft.  Musculoskeletal:     Right lower leg: Edema present.     Left lower leg: Edema present.     Comments: Long term venous stasis changes of bilateral lower extremities, question left greater than right  Skin:    General: Skin is warm and dry.     Capillary Refill: Capillary refill takes less than 2 seconds.  Neurological:     Mental Status: He is alert and oriented to person, place, and time.  Psychiatric:        Mood and Affect: Mood normal.        Behavior: Behavior normal.     ED Results / Procedures / Treatments   Labs (all labs ordered are listed, but only abnormal results are displayed) Labs Reviewed  BASIC METABOLIC PANEL - Abnormal; Notable for the following components:      Result Value   Glucose, Bld 147 (*)    Creatinine, Ser 1.49 (*)    Calcium 8.6 (*)    GFR, Estimated 52 (*)    All other components within normal limits  CBC - Abnormal; Notable for the following components:   RBC 3.84 (*)    Hemoglobin 12.7 (*)    MCV 103.6 (*)    All other components within normal limits  BRAIN NATRIURETIC PEPTIDE  TROPONIN I (HIGH SENSITIVITY)  TROPONIN I (HIGH SENSITIVITY)    EKG EKG Interpretation Date/Time:  Saturday June 18 2023 12:14:26 EST Ventricular Rate:  74 PR Interval:    QRS Duration:  129 QT  Interval:  424 QTC Calculation: 471 R Axis:   -30  Text Interpretation: Sinus rhythm Left bundle branch block Artifact in lead(s) I II aVR aVL aVF V1 V2 Confirmed by Ernie Avena ([id number]) on 06/18/2023 2:26:29 PM  Radiology VAS [place] LOWER EXTREMITY VENOUS (DVT) (7a-7p) Result Date: 06/18/2023  Lower Venous DVT Study Patient Name:  Ronnie Hernandez  Date of  Exam:   06/18/2023 Medical Rec #: [id number]          Accession #:    [id number] Date of Birth: December 08, 1960         Patient Gender: M Patient Age:   82 years Exam Location:  [facility] Procedure:      VAS [place] LOWER EXTREMITY VENOUS (DVT) Referring Phys: Taneshia Lorence --------------------------------------------------------------------------------  Indications: Edema. Other Indications: HFpEF. Risk Factors: Immobility. Limitations: Body habitus (BMI >70), skin texture Comparison Study: Previous exams on 11/16/22, 12/19/21, 06/03/20, 01/17/20, 08/15/19,                   08/26/18, 03/31/18, 04/18/17, 08/06/16, 01/29/16 ALL NEGATIVE FOR                   DVT. ALL EXAMS LIMITED due to habitus. Performing Technologist: Sherren Kerns RVS  Examination Guidelines: A complete evaluation includes B-mode imaging, spectral Doppler, color Doppler, and power Doppler as needed of all accessible portions of each vessel. Bilateral testing is considered an integral part of a complete examination. Limited examinations for reoccurring indications may be performed as noted. The reflux portion of the exam is performed with the patient in reverse Trendelenburg.  +---------+---------------+---------+-----------+----------+-------------------+ RIGHT    CompressibilityPhasicitySpontaneityPropertiesThrombus Aging      +---------+---------------+---------+-----------+----------+-------------------+ CFV      Full           Yes      No                                       +---------+---------------+---------+-----------+----------+-------------------+ SFJ      Full                                                              +---------+---------------+---------+-----------+----------+-------------------+ FV Prox  Full           Yes      No                                       +---------+---------------+---------+-----------+----------+-------------------+ FV Mid                  Yes      Yes                  patent by color and                                                       Doppler             +---------+---------------+---------+-----------+----------+-------------------+ FV Distal               Yes      No                   patent by color and  Doppler             +---------+---------------+---------+-----------+----------+-------------------+ PFV      Full                                                             +---------+---------------+---------+-----------+----------+-------------------+ POP                     Yes      No                   patent by color and                                                       Doppler             +---------+---------------+---------+-----------+----------+-------------------+ PTV                                                   Not well visualized +---------+---------------+---------+-----------+----------+-------------------+ PERO                                                  Not well visualized +---------+---------------+---------+-----------+----------+-------------------+  +---------+---------------+---------+-----------+----------+-------------------+ LEFT     CompressibilityPhasicitySpontaneityPropertiesThrombus Aging      +---------+---------------+---------+-----------+----------+-------------------+ CFV                     Yes      No                   patent by color and                                                       Doppler              +---------+---------------+---------+-----------+----------+-------------------+ FV Prox                 Yes      Yes                  patent by color and                                                       Doppler             +---------+---------------+---------+-----------+----------+-------------------+ FV Mid                  Yes      No  patent by color and                                                       Doppler             +---------+---------------+---------+-----------+----------+-------------------+ FV Distal               Yes      No                   patent by color and                                                       Doppler             +---------+---------------+---------+-----------+----------+-------------------+ PFV                                                   Not well visualized +---------+---------------+---------+-----------+----------+-------------------+ POP                                                   Not well visualized +---------+---------------+---------+-----------+----------+-------------------+ PTV                                                   Not well visualized +---------+---------------+---------+-----------+----------+-------------------+ PERO                                                  Not well visualized +---------+---------------+---------+-----------+----------+-------------------+    Summary: RIGHT: - There is no obvious evidence of deep vein thrombosis in the lower extremity. However, portions of this examination were limited- see technologist comments above.  - Ultrasound characteristics of enlarged lymph nodes are noted in the groin.  LEFT: - There is no obvious evidence of deep vein thrombosis in the lower extremity. However, portions of this examination were limited- see technologist comments above.  *See table(s) above for measurements and observations.     Preliminary     Procedures Procedures    Medications Ordered in ED Medications - No data to display  ED Course/ Medical Decision Making/ A&P                                 Medical Decision Making Amount and/or Complexity of Data Reviewed Labs: ordered. Radiology: ordered.   This patient is a 62 y.o. male  who presents to the ED for concern of chest pain, shob.   Differential diagnoses prior to evaluation: The emergent differential diagnosis includes, but is not limited to,  ACS, AAS, PE,  Mallory-Weiss, Boerhaave's, Pneumonia, acute bronchitis, asthma or COPD exacerbation, anxiety, MSK pain or traumatic injury to the chest, acid reflux versus other . This is not an exhaustive differential.   Past Medical History / Co-morbidities / Social History: morbid obesity, BMI 71, hyperlipidemia, diabetes, previous pulmonary embolus, CHF, asthma, gout, hypertension  Additional history: Chart reviewed. Pertinent results include: Reviewed previous vascular ultrasounds, echo of the heart most recently 2023, limited due to habitus but 60 to 65% ejection fraction estimated.  Physical Exam: Physical exam performed. The pertinent findings include: Morbidly obese, bilateral lower extremity edema, no evidence of cellulitis.  Lab Tests/Imaging studies: I personally interpreted labs/imaging and the pertinent results include: BMP with elevated creatinine at 1.49, fairly stable compared to baseline, CBC unremarkable, initial troponin 5, delta pending, BNP and delta troponin pending at time of handoff.  I independently interpreted plain film chest x-ray as well as vascular ultrasound of bilateral lower extremities.  He has no DVTs bilaterally although exam is limited secondary to body habitus.  Chest x-ray shows no acute intrathoracic abnormalities, official radiology report pending a time of handoff.  Cardiac monitoring: EKG obtained and interpreted by myself and attending physician which shows:  accelerated junctional rhythm, no acute st-t changes at this time  3:03 PM Care of Ronnie Hernandez transferred to [facility] and Dr. Criss Alvine at the end of my shift as the patient will require reassessment once labs/imaging have resulted. Patient presentation, ED course, and plan of care discussed with review of all pertinent labs and imaging. Please see his/her note for further details regarding further ED course and disposition. Plan at time of handoff is pending delta troponin, BNP, official chest x-ray interpretation, patient would likely benefit from cardiology consult to discuss possible admission for high risk chest pain versus outpatient cardiology evaluation.  If he is continuing to have active chest pain I do think he would benefit from mission if he remains chest pain-free I think that he is stable for discharge if cardiology agrees. This may be altered or completely changed at the discretion of the oncoming team pending results of further workup.   Final Clinical Impression(s) / ED Diagnoses Final diagnoses:  None    Rx / DC Orders ED Discharge Orders     None         [place] 06/18/23 1503    Ernie Avena, MD 06/18/23 1521

## 2023-06-18 NOTE — ED Notes (Signed)
Patient transported to Vascular lab. Patient was placed on a hospital bed on arrival to ed.

## 2023-06-18 NOTE — Discharge Instructions (Addendum)
As discussed, your labs and imaging are reassuring. We will give you some urinals to use in case you can't make it to the bathroom in time. Take your home medications as prescribed.  The prescription Lasix you have will help with lower extremity swelling.  Elevate your legs above heart level while at rest.  Follow-up with your primary care provider in the next 3 days for reevaluation of your symptoms.  Return to the ED if you develop persistent chest pain or shortness of breath.

## 2023-06-18 NOTE — ED Provider Notes (Signed)
Patient care taken over at shift change.  Disposition pending chest x-ray results.  Patient reports waking up with increased lower leg pain and swelling.  He called his home health nurse and she told him to come to the ED for possible IV medication.  Patient states that he takes Lasix each day but has not taken it today.   He states this morning that he felt short of breath and dizzy with leaning forward momentarily but that resolved.  He endorses intermittent chest pains for the past 4 days but denies any at this time. Physical Exam  BP (!) 115/97   Pulse 73   Temp 97.6 F (36.4 C) (Oral)   Resp 15   Ht 5\' 6"  (1.676 m)   Wt (!) 200.5 kg   SpO2 99%   BMI 71.34 kg/m   Physical Exam  Procedures  Procedures  ED Course / MDM    Medical Decision Making Amount and/or Complexity of Data Reviewed Labs: ordered. Radiology: ordered.

## 2023-06-18 NOTE — ED Triage Notes (Signed)
Patient presents to ed via [facility] from home with c/o chest pain and pressure onset 12/24 states seh gets sob with movement and the pain is worse at times when bending over. States he has noticed more fluid in his lower ext. And abd. States he has been  taking medications at directed. Uses an electric wheelchair to get around at home. Patient able to speak in complete sentences. Appears no resp distress.

## 2023-06-18 NOTE — ED Notes (Signed)
2 [place] sandwiches given.

## 2023-06-18 NOTE — ED Notes (Signed)
Patient back in room

## 2023-08-27 ENCOUNTER — Emergency Department (HOSPITAL_COMMUNITY): Payer: Medicare (Managed Care)

## 2023-08-27 ENCOUNTER — Emergency Department (HOSPITAL_COMMUNITY)
Admission: EM | Admit: 2023-08-27 | Discharge: 2023-08-28 | Disposition: A | Discharge status: Home / Self Care | Payer: Medicare (Managed Care) | Attending: Emergency Medicine | Admitting: Emergency Medicine

## 2023-08-27 ENCOUNTER — Other Ambulatory Visit: Payer: Self-pay

## 2023-08-27 DIAGNOSIS — E119 Type 2 diabetes mellitus without complications: Secondary | ICD-10-CM | POA: Insufficient documentation

## 2023-08-27 DIAGNOSIS — R600 Localized edema: Secondary | ICD-10-CM

## 2023-08-27 DIAGNOSIS — L03116 Cellulitis of left lower limb: Secondary | ICD-10-CM | POA: Insufficient documentation

## 2023-08-27 DIAGNOSIS — Z7984 Long term (current) use of oral hypoglycemic drugs: Secondary | ICD-10-CM | POA: Insufficient documentation

## 2023-08-27 DIAGNOSIS — Z79899 Other long term (current) drug therapy: Secondary | ICD-10-CM | POA: Insufficient documentation

## 2023-08-27 DIAGNOSIS — I110 Hypertensive heart disease with heart failure: Secondary | ICD-10-CM | POA: Insufficient documentation

## 2023-08-27 DIAGNOSIS — I509 Heart failure, unspecified: Secondary | ICD-10-CM | POA: Insufficient documentation

## 2023-08-27 LAB — BASIC METABOLIC PANEL
Anion gap: 9 (ref 5–15)
BUN: 21 mg/dL (ref 8–23)
CO2: 29 mmol/L (ref 22–32)
Calcium: 8.9 mg/dL (ref 8.9–10.3)
Chloride: 98 mmol/L (ref 98–111)
Creatinine, Ser: 1.26 mg/dL — ABNORMAL HIGH (ref 0.61–1.24)
GFR, Estimated: >60 mL/min (ref >60)
Glucose, Bld: 196 mg/dL — ABNORMAL HIGH (ref 70–99)
Potassium: 4.1 mmol/L (ref 3.5–5.1)
Sodium: 136 mmol/L (ref 135–145)

## 2023-08-27 LAB — CBC
HCT: 49.4 % (ref 39.0–52.0)
Hemoglobin: 15.8 g/dL (ref 13.0–17.0)
MCH: 32.8 pg (ref 26.0–34.0)
MCHC: 32 g/dL (ref 30.0–36.0)
MCV: 102.5 fL — ABNORMAL HIGH (ref 80.0–100.0)
Platelets: 159 10*3/uL (ref 150–400)
RBC: 4.82 MIL/uL (ref 4.22–5.81)
RDW: 13 % (ref 11.5–15.5)
WBC: 7.2 10*3/uL (ref 4.0–10.5)
nRBC: 0 % (ref 0.0–0.2)

## 2023-08-27 LAB — BRAIN NATRIURETIC PEPTIDE: B Natriuretic Peptide: 63.8 pg/mL (ref 0.0–100.0)

## 2023-08-27 MED ORDER — CEPHALEXIN 500 MG PO CAPS: 500.0000 mg | ORAL_CAPSULE | Freq: Two times a day (BID) | ORAL | 0 refills | Status: AC

## 2023-08-27 MED ORDER — CEPHALEXIN 500 MG PO CAPS
500.0000 mg | ORAL_CAPSULE | Freq: Once | ORAL | Status: AC
  Administered 2023-08-27: 500 mg via ORAL
  Filled 2023-08-27: qty 1

## 2023-08-27 MED ORDER — FUROSEMIDE 10 MG/ML IJ SOLN
40.0000 mg | INTRAMUSCULAR | Status: AC
  Administered 2023-08-27: 40 mg via INTRAVENOUS
  Filled 2023-08-27: qty 4

## 2023-08-27 MED ORDER — FUROSEMIDE 40 MG PO TABS: 40.0000 mg | ORAL_TABLET | Freq: Two times a day (BID) | ORAL | 0 refills | Status: DC

## 2023-08-27 NOTE — Discharge Instructions (Signed)
 You were seen for your leg swelling in the emergency department.   At home, please take the Lasix twice daily for the next 5 days.  Take the Keflex in case there is some infection.    Check your MyChart online for the results of any tests that had not resulted by the time you left the emergency department.   Follow-up with your primary doctor in 2-3 days regarding your visit.    Return immediately to the emergency department if you experience any of the following: Difficulty breathing, or any other concerning symptoms.    Thank you for visiting our Emergency Department. It was a pleasure taking care of you today.

## 2023-08-27 NOTE — ED Provider Notes (Signed)
 [place] [facility]ERGENCY DEPARTMENT AT [facility] Provider Note   CSN: [id number] Arrival date & time: 08/27/23  4403     History  Chief Complaint  Patient presents with   Leg Swelling    Ronnie QUEBEDEAUX is a 63 y.o. male.  63 year old male with history of CHF, lymphedema, undergoing workup for cirrhosis, hypertension, hyperlipidemia, diabetes, and morbid obesity presents emergency department with lower extremity swelling.  Patient reports that for years she has had lower extremity swelling.  Takes Lasix 40 mg daily for this.  Still makes urine with his Lasix.  Over the past month thinks that the swelling has gotten worsen.  Has noticed some redness and drainage from his legs.  Says it is more prominent in his left leg.  No fevers.  No new shortness of breath.  Says his current weight is 444 LBS but is unsure his dry weight.       Home Medications Prior to Admission medications   Medication Sig Start Date End Date Taking? Authorizing Provider  cephALEXin (KEFLEX) 500 MG capsule Take 1 capsule (500 mg total) by mouth 2 (two) times daily for 5 days. 08/27/23 09/01/23 Yes Rondel Baton, MD  furosemide (LASIX) 40 MG tablet Take 1 tablet (40 mg total) by mouth 2 (two) times daily for 5 days. 08/27/23 09/01/23 Yes Rondel Baton, MD  albuterol (VENTOLIN HFA) 108 (90 Base) MCG/ACT inhaler Inhale 1-2 puffs into the lungs every 6 (six) hours as needed for wheezing or shortness of breath.    [provider]  allopurinol (ZYLOPRIM) 300 MG tablet Take 1 tablet (300 mg total) by mouth 2 (two) times daily. Patient taking differently: Take 800 mg by mouth daily. 08/27/19   Sharon Seller, NP  ascorbic acid (VITAMIN C) 500 MG tablet Take 500 mg by mouth daily.    [provider]  atorvastatin (LIPITOR) 80 MG tablet Take 80 mg by mouth daily.    [provider]  canagliflozin (INVOKANA) 300 MG TABS tablet Take 300 mg by mouth daily.    [provider]   Cholecalciferol (VITAMIN D3) 50 MCG (2000 UT) CAPS Take 2,000 Units by mouth daily.    [provider]  colchicine 0.6 MG tablet Take 1 tablet (0.6 mg total) by mouth daily. Patient taking differently: Take 0.6 mg by mouth 2 (two) times daily. 04/04/18   Burnadette Pop, MD  fenofibrate 160 MG tablet Take 160 mg by mouth daily.    [provider]  fluticasone (FLONASE) 50 MCG/ACT nasal spray Place 1 spray into both nostrils daily as needed for allergies or rhinitis.    [provider]  furosemide (LASIX) 40 MG tablet Take one tablet by mouth once daily 12/22/21   Pokhrel, Rebekah Chesterfield, MD  levothyroxine (SYNTHROID) 112 MCG tablet Take 112 mcg by mouth daily before breakfast.    [provider]  linagliptin (TRADJENTA) 5 MG TABS tablet Take 5 mg by mouth daily.    [provider]  losartan (COZAAR) 25 MG tablet Take 1 tablet (25 mg total) by mouth daily. 12/24/21   Pokhrel, Rebekah Chesterfield, MD  magnesium chloride (SLOW-MAG) 64 MG TBEC SR tablet Take 1 tablet by mouth daily as needed (muscle relaxation).    [provider]  menthol-zinc oxide (GOLD BOND) powder Apply topically See admin instructions. Apply topically twice daily to abdominal folds.    [provider]  metFORMIN (GLUCOPHAGE) 500 MG tablet Take 2 tablets (1,000 mg total) by mouth 2 (two) times  daily with a meal. 12/24/21   Pokhrel, Laxman, MD  Tacrolimus 0.1 % CREA Apply topically.    [provider]  triamcinolone ointment (KENALOG) 0.1 % Apply 1 Application topically 2 (two) times daily. Alternate with tacrolimus two weeks on two weeks off    [provider]  UNABLE TO FIND bariatric drop arm commode Dx) R54, E66.01, I50.9, M19.9 07/09/19   Sharon Seller, NP  UNABLE TO FIND Bariatric tub transfer bench Dx) R54, E66.01, I50.9, M19.9 07/09/19   Sharon Seller, NP  UNABLE TO FIND Bariatric lift chair. Dx) R54, E66.01, I50.9, M19.9 07/09/19   Sharon Seller, NP  UNABLE  TO FIND Heavy duty Cane Dx) R54, E66.01, I50.9, M19.9 07/09/19   Sharon Seller, NP      Allergies    Uloric [febuxostat]    Review of Systems   Review of Systems  Physical Exam Updated Vital Signs BP (!) 142/80   Pulse 72   Temp 97.8 F (36.6 C)   Resp 18   Ht 5\' 6"  (1.676 m)   Wt (!) 199.6 kg   SpO2 100%   BMI 71.02 kg/m  Physical Exam Vitals and nursing note reviewed.  Constitutional:      General: He is not in acute distress.    Appearance: He is well-developed.  HENT:     Head: Normocephalic and atraumatic.     Right Ear: External ear normal.     Left Ear: External ear normal.     Nose: Nose normal.  Eyes:     Extraocular Movements: Extraocular movements intact.     Conjunctiva/sclera: Conjunctivae normal.     Pupils: Pupils are equal, round, and reactive to light.  Cardiovascular:     Rate and Rhythm: Normal rate and regular rhythm.     Heart sounds: Normal heart sounds.  Pulmonary:     Effort: Pulmonary effort is normal. No respiratory distress.     Breath sounds: Normal breath sounds.     Comments: Limited due to habitus Abdominal:     General: There is no distension.     Palpations: Abdomen is soft. There is no mass.     Tenderness: There is no abdominal tenderness. There is no guarding.  Musculoskeletal:     Cervical back: Normal range of motion and neck supple.     Right lower leg: Edema present.     Left lower leg: Edema present.  Skin:    General: Skin is warm and dry.  Neurological:     Mental Status: He is alert. Mental status is at baseline.  Psychiatric:        Mood and Affect: Mood normal.        Behavior: Behavior normal.     ED Results / Procedures / Treatments   Labs (all labs ordered are listed, but only abnormal results are displayed) Labs Reviewed  BASIC METABOLIC PANEL - Abnormal; Notable for the following components:      Result Value   Glucose, Bld 196 (*)    Creatinine, Ser 1.26 (*)    All other components within  normal limits  CBC - Abnormal; Notable for the following components:   MCV 102.5 (*)    All other components within normal limits  BRAIN NATRIURETIC PEPTIDE    EKG EKG Interpretation Date/Time:  Saturday August 27 2023 09:25:38 EST Ventricular Rate:  77 PR Interval:  287 QRS Duration:  106 QT Interval:  415 QTC Calculation: 470 R Axis:   -  44  Text Interpretation: Sinus rhythm Prolonged PR interval Left anterior fascicular block Probable anteroseptal infarct, old Borderline T abnormalities, inferior leads Confirmed by Vonita Moss [phone]) on 08/27/2023 1:00:08 PM  Radiology DG Chest 2 View Result Date: 08/27/2023 CLINICAL DATA:  Shortness of breath.  Leg swelling for a week. EXAM: CHEST - 2 VIEW COMPARISON:  X-ray 06/18/2023 and older. FINDINGS: A lateral view is nondiagnostic. Frontal view is under penetrated. The right inferior costophrenic angle is clipped off the edge of the film. Enlarged cardiopericardial silhouette with calcified aorta. No consolidation, pneumothorax or effusion. No edema. Overlapping cardiac leads. Is also motion on the frontal view IMPRESSION: Limited radiographs. The lateral view is also specifically nondiagnostic. Enlarged heart with calcified aorta. Additional workup as clinically appropriate. Electronically Signed   By: Karen Kays M.D.   On: 08/27/2023 11:10    Procedures Procedures    Medications Ordered in ED Medications  cephALEXin (KEFLEX) capsule 500 mg (500 mg Oral Given 08/27/23 1119)  furosemide (LASIX) injection 40 mg (40 mg Intravenous Given 08/27/23 1120)    ED Course/ Medical Decision Making/ A&P Clinical Course as of 08/27/23 1302  Sat Aug 27, 2023  1108 Creatinine(!): 1.26 Baseline 1.5 [RP]    Clinical Course User Index [RP] Rondel Baton, MD                                 Medical Decision Making Amount and/or Complexity of Data Reviewed Labs: ordered. Decision-making details documented in ED Course. Radiology:  ordered.  Risk Prescription drug management.   Ronnie Hernandez is a 63 y.o. male with comorbidities that complicate the patient evaluation including CHF, lymphedema, undergoing workup for cirrhosis, hypertension, hyperlipidemia, diabetes, and morbid obesity presents emergency department with lower extremity swelling.   Initial Ddx:  Lymphedema, volume overload, cellulitis, CHF exacerbation  MDM/Course:  Patient reports to the emergency department with lower extremity swelling.  Also reported some redness and small amount of drainage.  Does have a history of both lymphedema and CHF and liver disease is currently being worked up.  Does have some mild shortness of breath but is satting well on room air.  I do not appreciate any significant rales but this is limited due to his habitus.  Does have lower extremity edema bilaterally with some erythema on the left side of his leg.  Mostly nonpitting so I suspect that it is predominantly lymphedema but his exam is very limited due to his habitus.  Suspect that there may be a small component of volume overload as well so we will diurese him.  With the area of erythema on his left leg will cover him with antibiotics for cellulitis.  Upon re-evaluation patient had made significant urine output.  Discussed disposition with him and he feels comfortable going home.  Will give him several days of increased Lasix dose and antibiotics and have him follow-up with his primary doctor in several days.  This patient presents to the ED for concern of complaints listed in HPI, this involves an extensive number of treatment options, and is a complaint that carries with it a high risk of complications and morbidity. Disposition including potential need for admission considered.   Dispo: DC Home. Return precautions discussed including, but not limited to, those listed in the AVS. Allowed pt time to ask questions which were answered fully prior to dc.  Records reviewed  Outpatient Clinic Notes The following labs were  independently interpreted: Chemistry and show CKD I independently reviewed the following imaging with scope of interpretation limited to determining acute life threatening conditions related to emergency care: Chest x-ray and agree with the radiologist interpretation with the following exceptions: none I personally reviewed and interpreted cardiac monitoring: normal sinus rhythm  I personally reviewed and interpreted the pt's EKG: see above for interpretation  I have reviewed the patients home medications and made adjustments as needed  Portions of this note were generated with Dragon dictation software. Dictation errors may occur despite best attempts at proofreading.     Final Clinical Impression(s) / ED Diagnoses Final diagnoses:  Peripheral edema  Cellulitis of left lower extremity    Rx / DC Orders ED Discharge Orders          Ordered    cephALEXin (KEFLEX) 500 MG capsule  2 times daily        08/27/23 1151    furosemide (LASIX) 40 MG tablet  2 times daily        08/27/23 1151              Rondel Baton, MD 08/27/23 1302

## 2023-08-27 NOTE — ED Triage Notes (Signed)
 Pt BIB [facility] for leg swelling x1 week with worse change yesterday, reports note noticeably in the feet. Takes 40mg  lasix daily. Reports some chest tightness and shob. Abdomen also tight. Denies urinary sx.   140 palp HR 84 RR 16 97% RA CBG 347

## 2023-08-28 NOTE — ED Notes (Signed)
 Patient's bed saturated in urine. Patient cleaned and bed cleaned and changed. Called [facility] around [phone] and they verified that he was the next patient on the list to be transported home. Waiting on [facility]'s arrival.

## 2024-01-22 ENCOUNTER — Emergency Department (HOSPITAL_COMMUNITY): Payer: Medicare (Managed Care)

## 2024-01-22 ENCOUNTER — Other Ambulatory Visit: Payer: Self-pay

## 2024-01-22 ENCOUNTER — Emergency Department (HOSPITAL_COMMUNITY)
Admission: EM | Admit: 2024-01-22 | Discharge: 2024-01-22 | Disposition: A | Discharge status: Home / Self Care | Payer: Medicare (Managed Care) | Attending: Student in an Organized Health Care Education/Training Program | Admitting: Student in an Organized Health Care Education/Training Program

## 2024-01-22 DIAGNOSIS — Z23 Encounter for immunization: Secondary | ICD-10-CM | POA: Insufficient documentation

## 2024-01-22 DIAGNOSIS — W232XXA Caught, crushed, jammed or pinched between a moving and stationary object, initial encounter: Secondary | ICD-10-CM | POA: Diagnosis not present

## 2024-01-22 DIAGNOSIS — S81812A Laceration without foreign body, left lower leg, initial encounter: Secondary | ICD-10-CM | POA: Insufficient documentation

## 2024-01-22 MED ORDER — SULFAMETHOXAZOLE-TRIMETHOPRIM 800-160 MG PO TABS: 1.0000 | ORAL_TABLET | Freq: Two times a day (BID) | ORAL | 0 refills | Status: AC

## 2024-01-22 MED ORDER — TETANUS-DIPHTH-ACELL PERTUSSIS 5-2.5-18.5 LF-MCG/0.5 IM SUSY
0.5000 mL | PREFILLED_SYRINGE | Freq: Once | INTRAMUSCULAR | Status: AC
  Administered 2024-01-22: 0.5 mL via INTRAMUSCULAR
  Filled 2024-01-22: qty 0.5

## 2024-01-22 MED ORDER — LIDOCAINE-EPINEPHRINE (PF) 2 %-1:200000 IJ SOLN
20.0000 mL | Freq: Once | INTRAMUSCULAR | Status: AC
  Administered 2024-01-22: 20 mL
  Filled 2024-01-22: qty 20

## 2024-01-22 NOTE — ED Triage Notes (Signed)
 Pt bib [facility] [place]. Pt ran into piece of metal while on his powered w/c. Pt has lac on left lower leg. Unknown last tetanus shot.

## 2024-01-22 NOTE — ED Provider Notes (Signed)
  EMERGENCY DEPARTMENT AT [facility] Provider Note   CSN: [id number] Arrival date & time: 01/22/24  1408     Patient presents with: Puncture Wound   Ronnie Hernandez is a 63 y.o. male who presents to the ED today with a laceration to the proximal left shin after an incident when he was being wheeled on his wheelchair into a transportation [place], his leg caught a piece of hardware that caused the aforementioned wound.  Bleeding is controlled prior to arrival, and there is no reports of him being on any anticoagulation.   HPI     Prior to Admission medications   Medication Sig Start Date End Date Taking? Authorizing Provider  sulfamethoxazole -trimethoprim  (BACTRIM  DS) 800-160 MG tablet Take 1 tablet by mouth 2 (two) times daily for 7 days. 01/22/24 01/29/24 Yes Myriam Dorn JAYSON, PA  albuterol  (VENTOLIN  HFA) 108 (90 Base) MCG/ACT inhaler Inhale 1-2 puffs into the lungs every 6 (six) hours as needed for wheezing or shortness of breath.    [provider]  allopurinol  (ZYLOPRIM ) 300 MG tablet Take 1 tablet (300 mg total) by mouth 2 (two) times daily. Patient taking differently: Take 800 mg by mouth daily. 08/27/19   Caro Harlene POUR, NP  ascorbic acid  (VITAMIN C) 500 MG tablet Take 500 mg by mouth daily.    [provider]  atorvastatin  (LIPITOR ) 80 MG tablet Take 80 mg by mouth daily.    [provider]  canagliflozin (INVOKANA) 300 MG TABS tablet Take 300 mg by mouth daily.    [provider]  Cholecalciferol (VITAMIN D3) 50 MCG (2000 UT) CAPS Take 2,000 Units by mouth daily.    [provider]  colchicine  0.6 MG tablet Take 1 tablet (0.6 mg total) by mouth daily. Patient taking differently: Take 0.6 mg by mouth 2 (two) times daily. 04/04/18   Jillian Buttery, MD  fenofibrate  160 MG tablet Take 160 mg by mouth daily.    [provider]  fluticasone  (FLONASE ) 50 MCG/ACT nasal spray Place 1 spray into both nostrils daily  as needed for allergies or rhinitis.    [provider]  furosemide  (LASIX ) 40 MG tablet Take one tablet by mouth once daily 12/22/21   Pokhrel, Laxman, MD  furosemide  (LASIX ) 40 MG tablet Take 1 tablet (40 mg total) by mouth 2 (two) times daily for 5 days. 08/27/23 09/01/23  Yolande Lamar JAYSON, MD  levothyroxine  (SYNTHROID ) 112 MCG tablet Take 112 mcg by mouth daily before breakfast.    [provider]  linagliptin (TRADJENTA) 5 MG TABS tablet Take 5 mg by mouth daily.    [provider]  losartan  (COZAAR ) 25 MG tablet Take 1 tablet (25 mg total) by mouth daily. 12/24/21   Pokhrel, Laxman, MD  magnesium  chloride (SLOW-MAG) 64 MG TBEC SR tablet Take 1 tablet by mouth daily as needed (muscle relaxation).    [provider]  menthol -zinc oxide (GOLD BOND) powder Apply topically See admin instructions. Apply topically twice daily to abdominal folds.    [provider]  metFORMIN  (GLUCOPHAGE ) 500 MG tablet Take 2 tablets (1,000 mg total) by mouth 2 (two) times daily with a meal. 12/24/21   Pokhrel, Laxman, MD  Tacrolimus 0.1 % CREA Apply topically.    [provider]  triamcinolone  ointment (KENALOG) 0.1 % Apply 1 Application topically 2 (two) times daily. Alternate with tacrolimus two weeks on two weeks off    [provider]  UNABLE TO FIND bariatric drop arm  commode Dx) R54, E66.01, I50.9, M19.9 07/09/19   Caro Harlene POUR, NP  UNABLE TO FIND Bariatric tub transfer bench Dx) R54, E66.01, I50.9, M19.9 07/09/19   Caro Harlene POUR, NP  UNABLE TO FIND Bariatric lift chair. Dx) R54, E66.01, I50.9, M19.9 07/09/19   Caro Harlene POUR, NP  UNABLE TO FIND Heavy duty Cane Dx) R54, E66.01, I50.9, M19.9 07/09/19   Caro Harlene POUR, NP    Allergies: Uloric  [febuxostat ]    Review of Systems  Skin:  Positive for wound.  All other systems reviewed and are negative.   Updated Vital Signs BP (!) 149/67 (BP Location: Right Arm)   Pulse 74   Temp 98.1 F  (36.7 C) (Oral)   Resp 17   Ht 5' 6 (1.676 m)   Wt (!) 199.6 kg   SpO2 100%   BMI 71.02 kg/m   Physical Exam Vitals and nursing note reviewed.  Constitutional:      General: He is not in acute distress.    Appearance: He is well-developed.  HENT:     Head: Normocephalic and atraumatic.  Eyes:     Conjunctiva/sclera: Conjunctivae normal.  Cardiovascular:     Rate and Rhythm: Normal rate and regular rhythm.     Heart sounds: No murmur heard. Pulmonary:     Effort: Pulmonary effort is normal. No respiratory distress.     Breath sounds: Normal breath sounds.  Abdominal:     Palpations: Abdomen is soft.     Tenderness: There is no abdominal tenderness.  Musculoskeletal:        General: No swelling.     Cervical back: Neck supple.  Skin:    General: Skin is warm and dry.     Capillary Refill: Capillary refill takes less than 2 seconds.     Findings: Laceration present.     Comments: Approximately 3 cm laceration to the proximal left shin, hemostasis prior to arrival with direct pressure  Neurological:     Mental Status: He is alert.  Psychiatric:        Mood and Affect: Mood normal.     (all labs ordered are listed, but only abnormal results are displayed) Labs Reviewed - No data to display  EKG: None  Radiology: DG Tibia/Fibula Left Result Date: 01/22/2024 CLINICAL DATA:  Laceration to the proximal shin. EXAM: LEFT TIBIA AND FIBULA - 2 VIEW COMPARISON:  Left knee radiographs dated 03/31/2018. FINDINGS: No acute fracture or dislocation. Moderate to advanced tricompartmental osteoarthritis, most pronounced in the medial femorotibial compartment. Moderate degenerative changes of the tibiotalar joint and hindfoot. Generalized nonspecific subcutaneous edema of the left lower extremity. Dystrophic circumferential subcutaneous soft tissue calcifications. No radiopaque foreign body. IMPRESSION: 1. Generalized nonspecific subcutaneous edema of the left lower extremity. No  radiopaque foreign body. 2. No acute osseous abnormality. 3. Moderate to advanced tricompartmental osteoarthritis of the knee. 4. Moderate degenerative changes of the tibiotalar joint and hindfoot. Electronically Signed   By: Harrietta Sherry M.D.   On: 01/22/2024 14:50     .Laceration Repair  Date/Time: 01/22/2024 3:29 PM  Performed by: Myriam Dorn BROCKS, PA Authorized by: Myriam Dorn BROCKS, PA   Consent:    Consent obtained:  Verbal   Consent given by:  Patient   Risks, benefits, and alternatives were discussed: yes     Risks discussed:  Infection, pain, need for additional repair, poor cosmetic result and poor wound healing   Alternatives discussed:  No treatment, delayed treatment, observation and referral Universal protocol:  Procedure explained and questions answered to patient or proxy's satisfaction: yes     Imaging studies available: yes     Patient identity confirmed:  Verbally with patient, arm band and hospital-assigned identification number Anesthesia:    Anesthesia method:  Local infiltration   Local anesthetic:  Lidocaine  2% WITH epi Laceration details:    Location:  Leg   Leg location:  L lower leg   Length (cm):  3   Depth (mm):  2 Pre-procedure details:    Preparation:  Patient was prepped and draped in usual sterile fashion and imaging obtained to evaluate for foreign bodies Exploration:    Limited defect created (wound extended): no     Hemostasis achieved with:  Direct pressure   Imaging obtained: x-ray     Imaging outcome: foreign body not noted     Wound exploration: entire depth of wound visualized     Wound extent: areolar tissue violated     Wound extent: fascia not violated, no foreign body, no signs of injury, no nerve damage, no tendon damage, no underlying fracture and no vascular damage     Contaminated: no   Treatment:    Area cleansed with:  Povidone-iodine and chlorhexidine   Amount of cleaning:  Standard   Irrigation solution:  Sterile  water and sterile saline   Irrigation volume:  200 mL   Irrigation method:  Syringe   Debridement:  None   Undermining:  None   Scar revision: no   Skin repair:    Repair method:  Sutures   Suture size:  3-0   Suture material:  Prolene   Suture technique:  Simple interrupted   Number of sutures:  4 Approximation:    Approximation:  Close Repair type:    Repair type:  Simple Post-procedure details:    Dressing:  Antibiotic ointment, non-adherent dressing, sterile dressing and bulky dressing   Procedure completion:  Tolerated well, no immediate complications    Medications Ordered in the ED  Tdap (BOOSTRIX ) injection 0.5 mL (0.5 mLs Intramuscular Given 01/22/24 1452)  lidocaine -EPINEPHrine  (XYLOCAINE  W/EPI) 2 %-1:200000 (PF) injection 20 mL (20 mLs Infiltration Given 01/22/24 1452)                                    Medical Decision Making Amount and/or Complexity of Data Reviewed Radiology: ordered.  Risk Prescription drug management.   Medical Decision Making:   Ronnie Hernandez is a 63 y.o. male who presented to the ED today with laceration to the left shin detailed above.     Complete initial physical exam performed, notably the patient  was alert and oriented in no apparent distress.  Physical exam as noted and notable finding of a 3 cm laceration to the proximal left shin.    Reviewed and confirmed nursing documentation for past medical history, family history, social history.    Initial Assessment:   With the patient's presentation of laceration, most likely diagnosis is simple laceration.  Considered differential of embedded object within the wound, otherwise presents as a cutaneous laceration.   Initial Plan:  Obtain plain film imaging of the left tib-fib to rule out embedded foreign object and bony injury to the proximal tib-fib. Prepare for wound closure as noted in the procedure note. Objective evaluation as below reviewed   Initial Study Results:    Radiology:  All images reviewed independently. Agree with radiology report at this  time.   DG Tibia/Fibula Left Result Date: 01/22/2024 CLINICAL DATA:  Laceration to the proximal shin. EXAM: LEFT TIBIA AND FIBULA - 2 VIEW COMPARISON:  Left knee radiographs dated 03/31/2018. FINDINGS: No acute fracture or dislocation. Moderate to advanced tricompartmental osteoarthritis, most pronounced in the medial femorotibial compartment. Moderate degenerative changes of the tibiotalar joint and hindfoot. Generalized nonspecific subcutaneous edema of the left lower extremity. Dystrophic circumferential subcutaneous soft tissue calcifications. No radiopaque foreign body. IMPRESSION: 1. Generalized nonspecific subcutaneous edema of the left lower extremity. No radiopaque foreign body. 2. No acute osseous abnormality. 3. Moderate to advanced tricompartmental osteoarthritis of the knee. 4. Moderate degenerative changes of the tibiotalar joint and hindfoot. Electronically Signed   By: Harrietta Sherry M.D.   On: 01/22/2024 14:50     Reassessment and Plan:   Imaging did not demonstrate any embedded object so wound was closed as noted in the procedure note.  No complications noted, will also manage wound prophylaxis with outpatient course of Bactrim .  Care plan explained to patient, they have no further concerns or questions at this time.       Final diagnoses:  Laceration of left lower leg without complication, initial encounter    ED Discharge Orders          Ordered    sulfamethoxazole -trimethoprim  (BACTRIM  DS) 800-160 MG tablet  2 times daily        01/22/24 1534               Myriam Dorn BROCKS, [place] 01/22/24 1534    Lowther, Amy, DO 01/22/24 2210

## 2024-01-22 NOTE — ED Notes (Signed)
 [facility] arrived to transfer pt back to his home

## 2024-01-22 NOTE — Discharge Instructions (Signed)
 Please follow-up with your primary care in the next 10 to 14 days to have the sutures removed.  You can continue cleaning the wound as discussed, simple soap and water, you can place antibiotic ointment over top the wound and apply a clean dry dressings as the dressings become soiled or become wet.  Please return to the emergency department should you start to notice increased redness or swelling around the wound or to start to notice any concerning drainage from the wound.  Otherwise to be safe to follow-up with primary care.

## 2024-01-22 NOTE — ED Notes (Signed)
 [facility] called; 6th in line

## 2024-05-03 ENCOUNTER — Emergency Department (HOSPITAL_COMMUNITY): Payer: Medicare (Managed Care)

## 2024-05-03 ENCOUNTER — Encounter (HOSPITAL_COMMUNITY): Payer: Self-pay

## 2024-05-03 ENCOUNTER — Emergency Department (HOSPITAL_COMMUNITY)
Admission: EM | Admit: 2024-05-03 | Discharge: 2024-05-04 | Disposition: A | Discharge status: Home / Self Care | Payer: Medicare (Managed Care)

## 2024-05-03 DIAGNOSIS — M7989 Other specified soft tissue disorders: Secondary | ICD-10-CM

## 2024-05-03 DIAGNOSIS — Z7984 Long term (current) use of oral hypoglycemic drugs: Secondary | ICD-10-CM | POA: Insufficient documentation

## 2024-05-03 DIAGNOSIS — R059 Cough, unspecified: Secondary | ICD-10-CM | POA: Diagnosis not present

## 2024-05-03 DIAGNOSIS — I5033 Acute on chronic diastolic (congestive) heart failure: Secondary | ICD-10-CM | POA: Insufficient documentation

## 2024-05-03 DIAGNOSIS — R0602 Shortness of breath: Secondary | ICD-10-CM | POA: Diagnosis present

## 2024-05-03 DIAGNOSIS — E119 Type 2 diabetes mellitus without complications: Secondary | ICD-10-CM | POA: Diagnosis not present

## 2024-05-03 DIAGNOSIS — I509 Heart failure, unspecified: Secondary | ICD-10-CM

## 2024-05-03 LAB — COMPREHENSIVE METABOLIC PANEL WITH GFR
ALT: 41 U/L (ref 0–44)
AST: 58 U/L — ABNORMAL HIGH (ref 15–41)
Albumin: 3 g/dL — ABNORMAL LOW (ref 3.5–5.0)
Alkaline Phosphatase: 117 U/L (ref 38–126)
Anion gap: 7 (ref 5–15)
BUN: 16 mg/dL (ref 8–23)
CO2: 31 mmol/L (ref 22–32)
Calcium: 9 mg/dL (ref 8.9–10.3)
Chloride: 100 mmol/L (ref 98–111)
Creatinine, Ser: 1.06 mg/dL (ref 0.61–1.24)
GFR, Estimated: >60 mL/min (ref >60)
Glucose, Bld: 255 mg/dL — ABNORMAL HIGH (ref 70–99)
Potassium: 4.9 mmol/L (ref 3.5–5.1)
Sodium: 138 mmol/L (ref 135–145)
Total Bilirubin: 0.4 mg/dL (ref 0.0–1.2)
Total Protein: 7 g/dL (ref 6.5–8.1)

## 2024-05-03 LAB — CBC WITH DIFFERENTIAL/PLATELET
Abs Immature Granulocytes: 0.02 K/uL (ref 0.00–0.07)
Basophils Absolute: 0 K/uL (ref 0.0–0.1)
Basophils Relative: 0 %
Eosinophils Absolute: 0.4 K/uL (ref 0.0–0.5)
Eosinophils Relative: 5 %
HCT: 38.3 % — ABNORMAL LOW (ref 39.0–52.0)
Hemoglobin: 12.6 g/dL — ABNORMAL LOW (ref 13.0–17.0)
Immature Granulocytes: 0 %
Lymphocytes Relative: 31 %
Lymphs Abs: 2.6 K/uL (ref 0.7–4.0)
MCH: 33.3 pg (ref 26.0–34.0)
MCHC: 32.9 g/dL (ref 30.0–36.0)
MCV: 101.3 fL — ABNORMAL HIGH (ref 80.0–100.0)
Monocytes Absolute: 0.5 K/uL (ref 0.1–1.0)
Monocytes Relative: 7 %
Neutro Abs: 4.7 K/uL (ref 1.7–7.7)
Neutrophils Relative %: 57 %
Platelets: 185 K/uL (ref 150–400)
RBC: 3.78 MIL/uL — ABNORMAL LOW (ref 4.22–5.81)
RDW: 12.3 % (ref 11.5–15.5)
WBC: 8.2 K/uL (ref 4.0–10.5)
nRBC: 0 % (ref 0.0–0.2)

## 2024-05-03 LAB — RESP PANEL BY RT-PCR (RSV, FLU A&B, COVID)  RVPGX2
Influenza A by PCR: NEGATIVE
Influenza B by PCR: NEGATIVE
Resp Syncytial Virus by PCR: NEGATIVE
SARS Coronavirus 2 by RT PCR: NEGATIVE

## 2024-05-03 LAB — PRO BRAIN NATRIURETIC PEPTIDE: Pro Brain Natriuretic Peptide: 299 pg/mL (ref <300.0)

## 2024-05-03 LAB — D-DIMER, QUANTITATIVE: D-Dimer, Quant: 1.91 ug{FEU}/mL — ABNORMAL HIGH (ref 0.00–0.50)

## 2024-05-03 MED ORDER — IOHEXOL 350 MG/ML SOLN
75.0000 mL | Freq: Once | INTRAVENOUS | Status: AC | PRN
  Administered 2024-05-03: 75 mL via INTRAVENOUS

## 2024-05-03 NOTE — ED Notes (Signed)
 X-ray at bedside.

## 2024-05-03 NOTE — Discharge Instructions (Signed)
 For the next 3 days, Friday, Saturday, Sunday please take 60 mg of Lasix  twice daily.  You may then return to your normal dosing regimen of 40 mg twice daily.  Return here for concerning changes in your condition.

## 2024-05-03 NOTE — ED Notes (Signed)
 IV team at bedside

## 2024-05-03 NOTE — ED Provider Notes (Signed)
 [place] [facility] AT [facility] Provider Note   CSN: [id number] Arrival date & time: 05/03/24  1243     Patient presents with: Shortness of Breath and Cough   Ronnie Hernandez is a 63 y.o. male.    Shortness of Breath Associated symptoms: cough   Cough Associated symptoms: shortness of breath       Presents because of cough as well as shortness of breath.  Increasing over the past week.  Patient feels like he may have his had increased fluid buildup in his lower extremities.  Endorses some slight orthopnea as well.  Endorses some exertional shortness of breath beyond his baseline.  No chest pain.  No pleuritic chest pain.  No hemoptysis.  No chest pain at rest.  Endorses some chills but no documented fevers.  No obvious sick contacts.  Patient states he did get the flu shot 1 week ago.  No nausea vomit diarrhea.  No abdominal pain.  No dysuria.  Patient states has been compliant with his Lasix  to 40 mg.   Previous medical history reviewed : Patient last seen in the ED on 8/3 due to puncture wound.  Patient was admitted back in 2023.  Acute diastolic CHF.   Prior to Admission medications   Medication Sig Start Date End Date Taking? Authorizing Provider  albuterol  (VENTOLIN  HFA) 108 (90 Base) MCG/ACT inhaler Inhale 1-2 puffs into the lungs every 6 (six) hours as needed for wheezing or shortness of breath.    [provider]  allopurinol  (ZYLOPRIM ) 300 MG tablet Take 1 tablet (300 mg total) by mouth 2 (two) times daily. Patient taking differently: Take 800 mg by mouth daily. 08/27/19   Caro Harlene POUR, NP  ascorbic acid  (VITAMIN C) 500 MG tablet Take 500 mg by mouth daily.    [provider]  atorvastatin  (LIPITOR ) 80 MG tablet Take 80 mg by mouth daily.    [provider]  canagliflozin (INVOKANA) 300 MG TABS tablet Take 300 mg by mouth daily.    [provider]  Cholecalciferol (VITAMIN D3) 50 MCG (2000 UT) CAPS Take  2,000 Units by mouth daily.    [provider]  colchicine  0.6 MG tablet Take 1 tablet (0.6 mg total) by mouth daily. Patient taking differently: Take 0.6 mg by mouth 2 (two) times daily. 04/04/18   Jillian Buttery, MD  fenofibrate  160 MG tablet Take 160 mg by mouth daily.    [provider]  fluticasone  (FLONASE ) 50 MCG/ACT nasal spray Place 1 spray into both nostrils daily as needed for allergies or rhinitis.    [provider]  furosemide  (LASIX ) 40 MG tablet Take one tablet by mouth once daily 12/22/21   Pokhrel, Laxman, MD  furosemide  (LASIX ) 40 MG tablet Take 1 tablet (40 mg total) by mouth 2 (two) times daily for 5 days. 08/27/23 09/01/23  Yolande Lamar JAYSON, MD  levothyroxine  (SYNTHROID ) 112 MCG tablet Take 112 mcg by mouth daily before breakfast.    [provider]  linagliptin (TRADJENTA) 5 MG TABS tablet Take 5 mg by mouth daily.    [provider]  losartan  (COZAAR ) 25 MG tablet Take 1 tablet (25 mg total) by mouth daily. 12/24/21   Pokhrel, Vernal, MD  magnesium  chloride (SLOW-MAG) 64 MG TBEC SR tablet Take 1 tablet by mouth daily as needed (muscle relaxation).    [provider]  menthol -zinc oxide (GOLD BOND) powder Apply topically See admin instructions. Apply topically twice daily to abdominal folds.  [provider]  metFORMIN  (GLUCOPHAGE ) 500 MG tablet Take 2 tablets (1,000 mg total) by mouth 2 (two) times daily with a meal. 12/24/21   Pokhrel, Laxman, MD  Tacrolimus 0.1 % CREA Apply topically.    [provider]  triamcinolone  ointment (KENALOG) 0.1 % Apply 1 Application topically 2 (two) times daily. Alternate with tacrolimus two weeks on two weeks off    [provider]  UNABLE TO FIND bariatric drop arm commode Dx) R54, E66.01, I50.9, M19.9 07/09/19   Caro Harlene POUR, NP  UNABLE TO FIND Bariatric tub transfer bench Dx) R54, E66.01, I50.9, M19.9 07/09/19   Caro Harlene POUR, NP  UNABLE TO FIND Bariatric  lift chair. Dx) R54, E66.01, I50.9, M19.9 07/09/19   Caro Harlene POUR, NP  UNABLE TO FIND Heavy duty Cane Dx) R54, E66.01, I50.9, M19.9 07/09/19   Caro Harlene POUR, NP    Allergies: Uloric  [febuxostat ]    Review of Systems  Respiratory:  Positive for cough and shortness of breath.     Updated Vital Signs BP (!) 149/73   Pulse 90   Temp 97.6 F (36.4 C)   Resp 16   Ht 5' 6 (1.676 m)   Wt (!) 201.9 kg   SpO2 99%   BMI 71.82 kg/m   Physical Exam Vitals and nursing note reviewed.  Constitutional:      General: He is not in acute distress.    Appearance: He is well-developed.  HENT:     Head: Normocephalic and atraumatic.  Eyes:     Conjunctiva/sclera: Conjunctivae normal.  Cardiovascular:     Rate and Rhythm: Normal rate and regular rhythm.     Heart sounds: No murmur heard. Pulmonary:     Effort: Pulmonary effort is normal. No respiratory distress.     Breath sounds: Normal breath sounds.  Abdominal:     Palpations: Abdomen is soft.     Tenderness: There is no abdominal tenderness.  Musculoskeletal:        General: No swelling.     Cervical back: Neck supple.     Right lower leg: Edema present.     Left lower leg: Edema present.  Skin:    General: Skin is warm and dry.     Capillary Refill: Capillary refill takes less than 2 seconds.  Neurological:     Mental Status: He is alert.  Psychiatric:        Mood and Affect: Mood normal.     (all labs ordered are listed, but only abnormal results are displayed) Labs Reviewed  CBC WITH DIFFERENTIAL/PLATELET - Abnormal; Notable for the following components:      Result Value   RBC 3.78 (*)    Hemoglobin 12.6 (*)    HCT 38.3 (*)    MCV 101.3 (*)    All other components within normal limits  RESP PANEL BY RT-PCR (RSV, FLU A&B, COVID)  [id number]  PRO BRAIN NATRIURETIC PEPTIDE  COMPREHENSIVE METABOLIC PANEL WITH GFR  D-DIMER, QUANTITATIVE    EKG: None  Radiology: DG Chest Port 1 View Result Date:  05/03/2024 CLINICAL DATA:  Shortness of breath. EXAM: PORTABLE CHEST 1 VIEW COMPARISON:  Chest radiograph dated 08/27/2023. FINDINGS: No focal consolidation, pleural effusion or pneumothorax. Stable cardiac silhouette. Atherosclerotic calcification of the aorta. No acute osseous pathology. IMPRESSION: No active disease. Electronically Signed   By: Vanetta Chou M.D.   On: 05/03/2024 14:34     Procedures   Medications Ordered in the ED - No data to  display                                  Medical Decision Making Amount and/or Complexity of Data Reviewed Labs: ordered. Radiology: ordered.     HPI:   Presents because of cough as well as shortness of breath.  Increasing over the past week.  Patient feels like he may have his had increased fluid buildup in his lower extremities.  Endorses some slight orthopnea as well.  Endorses some exertional shortness of breath beyond his baseline.  No chest pain.  No pleuritic chest pain.  No hemoptysis.  No chest pain at rest.  Endorses some chills but no documented fevers.  No obvious sick contacts.  Patient states he did get the flu shot 1 week ago.  No nausea vomit diarrhea.  No abdominal pain.  No dysuria.  Patient states has been compliant with his Lasix  to 40 mg.   Previous medical history reviewed : Patient last seen in the ED on 8/3 due to puncture wound.  Patient was admitted back in 2023.  Acute diastolic CHF.   MDM:   Upon exam patient is a morbidly obese male who comes in because of shortness of breath.  Patient vital signs stable.  99% on room air.  No tachypnea no tachycardia.  Slightly hypertensive but otherwise unremarkable.  Has pitting edema in lower extremities.  This is likely chronic for him.  Otherwise, from what I cannot hear, lung sounds were normal.  No obvious rales I can appreciate but difficult to examine given body habitus.  Will obtain BNP as well as chest x-ray to evaluate for any kind of obvious hypervolemia.  Eval  for pneumonia.  Will obtain D-dimer given sedentary lifestyle.  No pleuritic chest pain hemoptysis.  Low risk at this time.  No tachycardia or tachypnea.  No hypoxia.  Think D-dimer is appropriate screening test.   Obtain COVID RSV and flu given the cough.   Reevaluation:   Upon reexamination, patient hemodynamically stable.  Remains A&O x 3 with GCS 15.  Patient will be signed out pending laboratory workup.  Chest x-ray reviewed.  No pneumothorax or infiltrate.   EKG Interpreted by Me: sinus    Cardiac Tele Interpreted by Me: sinus    I have independently interpreted the CXR   images and agree with the radiologist finding   Social Determinant of Health: morbid obesity    Disposition and Follow Up: sign out       Final diagnoses:  Shortness of breath  Swelling of lower extremity  Acute on chronic congestive heart failure, unspecified heart failure type [facility])    ED Discharge Orders     None          Simon Lavonia SAILOR, MD 05/03/24 [phone]

## 2024-05-03 NOTE — ED Triage Notes (Signed)
 Pt BIB ems for SOB and cough for 1 week, states ever since the flu shot he got 1 week ago, he doesn't feel good. States he has fluid build up in the legs, making it difficult to move around. Hx DM. VS stable, A&Ox4.

## 2024-05-03 NOTE — ED Provider Notes (Addendum)
 Care of the patient assumed at signout.  Patient awaiting CT angio, labs, reassessment, may be appropriate for discharge pending results, repeat assessment.   9:55 PM Patient in no distress, no oxygen  requirement, speaking and breathing easily.  He has been diuresing. We discussed his CT results, no pulmonary embolism and with no COVID, no pneumonia, no hemodynamic instability, patient will follow-up with primary care.  He will start increased diuretic dosing for the next 3 days.       Garrick Charleston, MD 05/03/24 2155    Garrick Charleston, MD 05/03/24 2156

## 2024-05-04 NOTE — ED Notes (Signed)
 [facility] has arrived to transport pt to his residence.   All necessary paperwork provided. Belongings bag handed off.

## 2024-05-04 NOTE — ED Notes (Signed)
 I gave patient a turkey breakfast sandwich and some orange juice

## 2024-05-04 NOTE — ED Notes (Signed)
 [facility] arrived but unable to get patient d/t patient body weight and size. Per [facility] they will come back and get there [employer]. Will continue to monitor.

## 2024-07-03 ENCOUNTER — Inpatient Hospital Stay (HOSPITAL_COMMUNITY)
Admission: EM | Admit: 2024-07-03 | Discharge: 2024-07-10 | DRG: 286 | Disposition: A | Discharge status: Home / Self Care | Payer: Medicare (Managed Care) | Attending: Internal Medicine | Admitting: Internal Medicine

## 2024-07-03 ENCOUNTER — Other Ambulatory Visit: Payer: Self-pay

## 2024-07-03 ENCOUNTER — Emergency Department (HOSPITAL_COMMUNITY): Payer: Medicare (Managed Care)

## 2024-07-03 ENCOUNTER — Encounter (HOSPITAL_COMMUNITY): Payer: Self-pay

## 2024-07-03 DIAGNOSIS — R9431 Abnormal electrocardiogram [ECG] [EKG]: Secondary | ICD-10-CM | POA: Diagnosis not present

## 2024-07-03 DIAGNOSIS — N1832 Chronic kidney disease, stage 3b: Secondary | ICD-10-CM | POA: Diagnosis present

## 2024-07-03 DIAGNOSIS — Z8679 Personal history of other diseases of the circulatory system: Secondary | ICD-10-CM | POA: Diagnosis not present

## 2024-07-03 DIAGNOSIS — E1165 Type 2 diabetes mellitus with hyperglycemia: Secondary | ICD-10-CM | POA: Diagnosis present

## 2024-07-03 DIAGNOSIS — E119 Type 2 diabetes mellitus without complications: Secondary | ICD-10-CM | POA: Diagnosis not present

## 2024-07-03 DIAGNOSIS — E039 Hypothyroidism, unspecified: Secondary | ICD-10-CM | POA: Diagnosis not present

## 2024-07-03 DIAGNOSIS — R0789 Other chest pain: Secondary | ICD-10-CM | POA: Diagnosis present

## 2024-07-03 DIAGNOSIS — E782 Mixed hyperlipidemia: Secondary | ICD-10-CM | POA: Diagnosis not present

## 2024-07-03 DIAGNOSIS — Z888 Allergy status to other drugs, medicaments and biological substances status: Secondary | ICD-10-CM

## 2024-07-03 DIAGNOSIS — I130 Hypertensive heart and chronic kidney disease with heart failure and stage 1 through stage 4 chronic kidney disease, or unspecified chronic kidney disease: Principal | ICD-10-CM | POA: Diagnosis present

## 2024-07-03 DIAGNOSIS — I878 Other specified disorders of veins: Secondary | ICD-10-CM | POA: Diagnosis present

## 2024-07-03 DIAGNOSIS — I214 Non-ST elevation (NSTEMI) myocardial infarction: Secondary | ICD-10-CM | POA: Diagnosis not present

## 2024-07-03 DIAGNOSIS — R0602 Shortness of breath: Secondary | ICD-10-CM | POA: Diagnosis not present

## 2024-07-03 DIAGNOSIS — E1122 Type 2 diabetes mellitus with diabetic chronic kidney disease: Secondary | ICD-10-CM | POA: Diagnosis present

## 2024-07-03 DIAGNOSIS — Z86711 Personal history of pulmonary embolism: Secondary | ICD-10-CM

## 2024-07-03 DIAGNOSIS — I4891 Unspecified atrial fibrillation: Secondary | ICD-10-CM | POA: Diagnosis present

## 2024-07-03 DIAGNOSIS — M069 Rheumatoid arthritis, unspecified: Secondary | ICD-10-CM | POA: Diagnosis present

## 2024-07-03 DIAGNOSIS — Z6841 Body Mass Index (BMI) 40.0 and over, adult: Secondary | ICD-10-CM

## 2024-07-03 DIAGNOSIS — I471 Supraventricular tachycardia, unspecified: Secondary | ICD-10-CM | POA: Diagnosis not present

## 2024-07-03 DIAGNOSIS — Z7984 Long term (current) use of oral hypoglycemic drugs: Secondary | ICD-10-CM

## 2024-07-03 DIAGNOSIS — R7989 Other specified abnormal findings of blood chemistry: Secondary | ICD-10-CM | POA: Diagnosis present

## 2024-07-03 DIAGNOSIS — R079 Chest pain, unspecified: Principal | ICD-10-CM

## 2024-07-03 DIAGNOSIS — Z79890 Hormone replacement therapy (postmenopausal): Secondary | ICD-10-CM

## 2024-07-03 DIAGNOSIS — I5033 Acute on chronic diastolic (congestive) heart failure: Secondary | ICD-10-CM | POA: Diagnosis present

## 2024-07-03 DIAGNOSIS — E785 Hyperlipidemia, unspecified: Secondary | ICD-10-CM | POA: Diagnosis present

## 2024-07-03 DIAGNOSIS — I440 Atrioventricular block, first degree: Secondary | ICD-10-CM | POA: Diagnosis present

## 2024-07-03 DIAGNOSIS — I5031 Acute diastolic (congestive) heart failure: Secondary | ICD-10-CM

## 2024-07-03 DIAGNOSIS — M109 Gout, unspecified: Secondary | ICD-10-CM | POA: Diagnosis present

## 2024-07-03 DIAGNOSIS — Z8249 Family history of ischemic heart disease and other diseases of the circulatory system: Secondary | ICD-10-CM

## 2024-07-03 DIAGNOSIS — K5900 Constipation, unspecified: Secondary | ICD-10-CM | POA: Diagnosis not present

## 2024-07-03 DIAGNOSIS — E871 Hypo-osmolality and hyponatremia: Secondary | ICD-10-CM | POA: Diagnosis present

## 2024-07-03 DIAGNOSIS — Z800 Family history of malignant neoplasm of digestive organs: Secondary | ICD-10-CM

## 2024-07-03 DIAGNOSIS — N179 Acute kidney failure, unspecified: Secondary | ICD-10-CM | POA: Diagnosis present

## 2024-07-03 DIAGNOSIS — Z993 Dependence on wheelchair: Secondary | ICD-10-CM

## 2024-07-03 DIAGNOSIS — Z87891 Personal history of nicotine dependence: Secondary | ICD-10-CM

## 2024-07-03 DIAGNOSIS — Z79899 Other long term (current) drug therapy: Secondary | ICD-10-CM

## 2024-07-03 HISTORY — DX: Chronic diastolic (congestive) heart failure: I50.32

## 2024-07-03 HISTORY — DX: Hypothyroidism, unspecified: E03.9

## 2024-07-03 LAB — BASIC METABOLIC PANEL WITH GFR
Anion gap: 10 (ref 5–15)
BUN: 15 mg/dL (ref 8–23)
CO2: 28 mmol/L (ref 22–32)
Calcium: 9.1 mg/dL (ref 8.9–10.3)
Chloride: 95 mmol/L — ABNORMAL LOW (ref 98–111)
Creatinine, Ser: 1.13 mg/dL (ref 0.61–1.24)
GFR, Estimated: >60 mL/min
Glucose, Bld: 356 mg/dL — ABNORMAL HIGH (ref 70–99)
Potassium: 5 mmol/L (ref 3.5–5.1)
Sodium: 132 mmol/L — ABNORMAL LOW (ref 135–145)

## 2024-07-03 LAB — CBC
HCT: 42.2 % (ref 39.0–52.0)
Hemoglobin: 14.1 g/dL (ref 13.0–17.0)
MCH: 34.5 pg — ABNORMAL HIGH (ref 26.0–34.0)
MCHC: 33.4 g/dL (ref 30.0–36.0)
MCV: 103.2 fL — ABNORMAL HIGH (ref 80.0–100.0)
Platelets: 158 K/uL (ref 150–400)
RBC: 4.09 MIL/uL — ABNORMAL LOW (ref 4.22–5.81)
RDW: 12.8 % (ref 11.5–15.5)
WBC: 7.5 K/uL (ref 4.0–10.5)
nRBC: 0 % (ref 0.0–0.2)

## 2024-07-03 LAB — T4, FREE: Free T4: 0.96 ng/dL (ref 0.80–2.00)

## 2024-07-03 LAB — LIPID PANEL
Cholesterol: 188 mg/dL (ref 0–200)
HDL: 41 mg/dL
LDL Cholesterol: 120 mg/dL — ABNORMAL HIGH (ref 0–99)
Total CHOL/HDL Ratio: 4.6 ratio
Triglycerides: 136 mg/dL
VLDL: 27 mg/dL (ref 0–40)

## 2024-07-03 LAB — TROPONIN T, HIGH SENSITIVITY
Troponin T High Sensitivity: 105 ng/L (ref 0–19)
Troponin T High Sensitivity: 109 ng/L (ref 0–19)

## 2024-07-03 LAB — CBG MONITORING, ED: Glucose-Capillary: 260 mg/dL — ABNORMAL HIGH (ref 70–99)

## 2024-07-03 LAB — MAGNESIUM: Magnesium: 1.7 mg/dL (ref 1.7–2.4)

## 2024-07-03 LAB — PRO BRAIN NATRIURETIC PEPTIDE: Pro Brain Natriuretic Peptide: 1802 pg/mL — ABNORMAL HIGH

## 2024-07-03 MED ORDER — ACETAMINOPHEN 650 MG RE SUPP: 650.0000 mg | Freq: Four times a day (QID) | RECTAL | Status: DC | PRN

## 2024-07-03 MED ORDER — FUROSEMIDE 10 MG/ML IJ SOLN
80.0000 mg | Freq: Two times a day (BID) | INTRAMUSCULAR | Status: DC
  Administered 2024-07-04 (×2): 80 mg via INTRAVENOUS
  Filled 2024-07-03 (×2): qty 8

## 2024-07-03 MED ORDER — ATORVASTATIN CALCIUM 80 MG PO TABS
80.0000 mg | ORAL_TABLET | Freq: Every day | ORAL | Status: DC
  Administered 2024-07-03 – 2024-07-10 (×8): 80 mg via ORAL
  Filled 2024-07-03 (×3): qty 1
  Filled 2024-07-03: qty 2
  Filled 2024-07-03 (×2): qty 1
  Filled 2024-07-03: qty 2
  Filled 2024-07-03: qty 1

## 2024-07-03 MED ORDER — LOSARTAN POTASSIUM 50 MG PO TABS
25.0000 mg | ORAL_TABLET | Freq: Every day | ORAL | Status: DC
  Administered 2024-07-04: 25 mg via ORAL
  Filled 2024-07-03: qty 1

## 2024-07-03 MED ORDER — LORAZEPAM 2 MG/ML IJ SOLN: 0.5000 mg | Freq: Four times a day (QID) | INTRAMUSCULAR | Status: DC | PRN

## 2024-07-03 MED ORDER — LEVOTHYROXINE SODIUM 112 MCG PO TABS: 112.0000 ug | ORAL_TABLET | Freq: Every day | ORAL | Status: DC

## 2024-07-03 MED ORDER — PANTOPRAZOLE SODIUM 40 MG IV SOLR
40.0000 mg | INTRAVENOUS | Status: DC
  Administered 2024-07-03 – 2024-07-05 (×3): 40 mg via INTRAVENOUS
  Filled 2024-07-03 (×3): qty 10

## 2024-07-03 MED ORDER — ONDANSETRON HCL 4 MG/2ML IJ SOLN: 4.0000 mg | Freq: Four times a day (QID) | INTRAMUSCULAR | Status: DC | PRN

## 2024-07-03 MED ORDER — FENOFIBRATE 160 MG PO TABS
160.0000 mg | ORAL_TABLET | Freq: Every day | ORAL | Status: DC
  Administered 2024-07-04 – 2024-07-10 (×7): 160 mg via ORAL
  Filled 2024-07-03 (×7): qty 1

## 2024-07-03 MED ORDER — FUROSEMIDE 10 MG/ML IJ SOLN: 40.0000 mg | Freq: Two times a day (BID) | INTRAMUSCULAR | Status: DC

## 2024-07-03 MED ORDER — MELATONIN 3 MG PO TABS
3.0000 mg | ORAL_TABLET | Freq: Every evening | ORAL | Status: DC | PRN
  Administered 2024-07-05: 3 mg via ORAL
  Filled 2024-07-03: qty 1

## 2024-07-03 MED ORDER — INSULIN ASPART 100 UNIT/ML IJ SOLN
0.0000 [IU] | Freq: Every day | INTRAMUSCULAR | Status: DC
  Administered 2024-07-03 – 2024-07-04 (×2): 3 [IU] via SUBCUTANEOUS
  Administered 2024-07-05: 4 [IU] via SUBCUTANEOUS
  Administered 2024-07-06: 3 [IU] via SUBCUTANEOUS
  Administered 2024-07-07: 2 [IU] via SUBCUTANEOUS
  Administered 2024-07-08: 3 [IU] via SUBCUTANEOUS
  Filled 2024-07-03: qty 3
  Filled 2024-07-03: qty 4
  Filled 2024-07-03: qty 2
  Filled 2024-07-03 (×3): qty 3

## 2024-07-03 MED ORDER — FENTANYL CITRATE (PF) 50 MCG/ML IJ SOSY
25.0000 ug | PREFILLED_SYRINGE | INTRAMUSCULAR | Status: DC | PRN
  Administered 2024-07-04: 25 ug via INTRAVENOUS
  Filled 2024-07-03: qty 1

## 2024-07-03 MED ORDER — FUROSEMIDE 10 MG/ML IJ SOLN
40.0000 mg | Freq: Once | INTRAMUSCULAR | Status: AC
  Administered 2024-07-03: 40 mg via INTRAVENOUS
  Filled 2024-07-03: qty 4

## 2024-07-03 MED ORDER — IOHEXOL 350 MG/ML SOLN
100.0000 mL | Freq: Once | INTRAVENOUS | Status: AC | PRN
  Administered 2024-07-03: 100 mL via INTRAVENOUS

## 2024-07-03 MED ORDER — ALUM & MAG HYDROXIDE-SIMETH 200-200-20 MG/5ML PO SUSP: 15.0000 mL | Freq: Four times a day (QID) | ORAL | Status: DC | PRN

## 2024-07-03 MED ORDER — INSULIN ASPART 100 UNIT/ML IJ SOLN
0.0000 [IU] | Freq: Three times a day (TID) | INTRAMUSCULAR | Status: DC
  Administered 2024-07-04: 11 [IU] via SUBCUTANEOUS
  Administered 2024-07-04: 5 [IU] via SUBCUTANEOUS
  Administered 2024-07-04: 11 [IU] via SUBCUTANEOUS
  Administered 2024-07-05 (×2): 8 [IU] via SUBCUTANEOUS
  Administered 2024-07-05: 5 [IU] via SUBCUTANEOUS
  Administered 2024-07-06: 8 [IU] via SUBCUTANEOUS
  Administered 2024-07-06 – 2024-07-07 (×5): 5 [IU] via SUBCUTANEOUS
  Administered 2024-07-08: 3 [IU] via SUBCUTANEOUS
  Administered 2024-07-08: 5 [IU] via SUBCUTANEOUS
  Administered 2024-07-08: 3 [IU] via SUBCUTANEOUS
  Administered 2024-07-09: 2 [IU] via SUBCUTANEOUS
  Administered 2024-07-09 – 2024-07-10 (×2): 3 [IU] via SUBCUTANEOUS
  Administered 2024-07-10: 2 [IU] via SUBCUTANEOUS
  Filled 2024-07-03: qty 2
  Filled 2024-07-03 (×5): qty 5
  Filled 2024-07-03: qty 8
  Filled 2024-07-03: qty 3
  Filled 2024-07-03: qty 8
  Filled 2024-07-03: qty 5
  Filled 2024-07-03: qty 8
  Filled 2024-07-03: qty 5
  Filled 2024-07-03 (×2): qty 3
  Filled 2024-07-03: qty 2
  Filled 2024-07-03: qty 3
  Filled 2024-07-03: qty 11
  Filled 2024-07-03: qty 5
  Filled 2024-07-03: qty 11

## 2024-07-03 MED ORDER — FUROSEMIDE 10 MG/ML IJ SOLN: 60.0000 mg | Freq: Two times a day (BID) | INTRAMUSCULAR | Status: DC

## 2024-07-03 MED ORDER — ASPIRIN 81 MG PO TBEC
81.0000 mg | DELAYED_RELEASE_TABLET | Freq: Every day | ORAL | Status: DC
  Administered 2024-07-03 – 2024-07-04 (×2): 81 mg via ORAL
  Filled 2024-07-03 (×2): qty 1

## 2024-07-03 MED ORDER — ACETAMINOPHEN 325 MG PO TABS: 650.0000 mg | ORAL_TABLET | Freq: Four times a day (QID) | ORAL | Status: DC | PRN

## 2024-07-03 NOTE — H&P (Signed)
 " History and Physical      Ronnie Hernandez FMW:[id number] DOB: [birth year] DOA: 07/03/2024; DOS: 07/03/2024  PCP: Pcp, No (will further assess) Patient coming from: home   I have personally briefly reviewed patient's old medical records in [facility]  Chief Complaint: Shortness of breath  HPI: Ronnie Hernandez is a 64 y.o. male with medical history significant for chronic diastolic heart failure, type 2 diabetes mellitus, history of pulmonary embolism in 2017 status post completion of anticoagulant course, essential pretension, hyperlipidemia, acquired hypothyroidism, who is admitted to [facility] on 07/03/2024 with acute on chronic diastolic heart failure after presenting from home to [facility] ED complaining of shortness of breath.   The patient reports 4 days of progressive shortness of breath associated with worsening edema in the bilateral lower extremities, associated with orthopnea.  Not associate with any productive cough or hemoptysis..  Over that timeframe, he is noted some left-sided chest discomfort that is not exertional and not reproducible with direct patient over the anterior chest wall.  Denies any associated palpitations, diaphoresis, nausea, vomiting, dizziness.  Has not take any nitroglycerin for his intermittent chest discomfort, and confirms that he is currently chest pain-free.  His medical history includes a history of pulmonary embolism x 1 approximately 10 years ago for which he completed a course of anticoagulation.  Not currently on anticoagulation.  Denies any recent subjective fever, chills, rigors, or generalized myalgias.  Medical history notable for chronic diastolic heart failure, with echocardiogram on 08/06/2016 showing evidence of grade 3 diastolic dysfunction.  He reports good compliance with his outpatient diuretic regimen which includes Lasix  40 mg p.o. daily.  Most recent echocardiogram was performed in July 2023, which was deemed to be of suboptimal  quality due to poor sound wave transmission.  Referred, for formal read, EF appeared to be preserved at 60 to 65%, without any overt evidence of focal wall motion abnormalities, right ventricular systolic function was indeterminate and there is no overt evidence of valvular pathology.  Medical history also includes essential hypertension which he is on losartan  as an outpatient.     ED Course:  Vital signs in the ED were notable for the following: Afebrile; heart rates in the 80s; systolic blood pressures in the low 130s; respiratory rate 17-21, oxygen  saturation 100% on room air.  Labs were notable for the following: BMP was notable for the following: Sodium 132 which adjusted approximately 136 when taken to Concomitant hyperglycemia and potassium 5.0, bicarbonate 20, anion gap 10, creatinine 1.13, glucose 356.  proBNP 1802 compared to most recent prior proBNP data point of 299 on 05/03/2024.  High sensitive troponin T 105, without any prior high-sensitivity troponin T data points available for point comparison.  CBC notable for evidence of 7500, hemoglobin 14.1, platelet count 158.  Per my interpretation, EKG in ED demonstrated the following: Sinus rhythm first-degree AV block, heart rate 84, prolonged QTc of 615, nonspecific T wave flattening in aVL and V5, and no evidence of ST changes, occluding no evidence of ST elevation.  Imaging in the ED, per corresponding formal radiology read, was notable for the following: 1 view chest x-ray showed no evidence of acute cardiopulmonary process, including no evidence of infiltrate or edema with effusion, or pneumothorax.  CTA chest with PE protocol showed nones of acute pulmonary embolism nor any evidence of pneumothorax.  EDP has discussed patient's case with on-call cardiology, who will formally consult, and recommend in the interval to refrain from initiation of heparin   drip.  While in the ED, the following were administered: Lasix  40 mg IV x 1  dose.  Subsequently, the patient was admitted for further evaluation and management of presenting acute on chronic diastolic heart failure, presentation notable for atypical chest pain, mildly elevated troponin in addition to evidence of QTc prolongation.     Review of Systems: As per HPI otherwise 10 point review of systems negative.   Past Medical History:  Diagnosis Date   Arthritis    knees, hands (04/19/2017)   Cellulitis 05/2020   bilateral legs   Childhood asthma    Diabetes type 2, uncontrolled    Gout    have had it in both feet, both hands, right shoulder, back (04/19/2017)   Hyperlipidemia    Hypertension    Morbidly obese (HCC)    Pulmonary embolism (HCC) 2017?    Past Surgical History:  Procedure Laterality Date   COLONOSCOPY W/ BIOPSIES AND POLYPECTOMY  07/2011   thelbert 07/27/2011    Social History:  reports that he quit smoking about 42 years ago. His smoking use included cigarettes. He has never used smokeless tobacco. He reports that he does not currently use drugs. He reports that he does not drink alcohol.   Allergies[1]  Family History  Problem Relation Age of Onset   Colon cancer Maternal Grandfather 70   Heart attack Father    Stomach cancer Maternal Aunt 64    Family history reviewed and not pertinent    Prior to Admission medications  Medication Sig Start Date End Date Taking? Authorizing Provider  albuterol  (VENTOLIN  HFA) 108 (90 Base) MCG/ACT inhaler Inhale 1-2 puffs into the lungs every 6 (six) hours as needed for wheezing or shortness of breath.    [provider]  allopurinol  (ZYLOPRIM ) 300 MG tablet Take 1 tablet (300 mg total) by mouth 2 (two) times daily. Patient taking differently: Take 800 mg by mouth daily. 08/27/19   Caro Harlene POUR, NP  ascorbic acid  (VITAMIN C) 500 MG tablet Take 500 mg by mouth daily.    [provider]  atorvastatin  (LIPITOR ) 80 MG tablet Take 80 mg by mouth daily.    [provider]  canagliflozin (INVOKANA) 300 MG TABS tablet Take 300 mg by mouth daily.    [provider]  Cholecalciferol (VITAMIN D3) 50 MCG (2000 UT) CAPS Take 2,000 Units by mouth daily.    [provider]  colchicine  0.6 MG tablet Take 1 tablet (0.6 mg total) by mouth daily. Patient taking differently: Take 0.6 mg by mouth 2 (two) times daily. 04/04/18   Jillian Buttery, MD  fenofibrate  160 MG tablet Take 160 mg by mouth daily.    [provider]  fluticasone  (FLONASE ) 50 MCG/ACT nasal spray Place 1 spray into both nostrils daily as needed for allergies or rhinitis.    [provider]  furosemide  (LASIX ) 40 MG tablet Take one tablet by mouth once daily 12/22/21   Pokhrel, Laxman, MD  furosemide  (LASIX ) 40 MG tablet Take 1 tablet (40 mg total) by mouth 2 (two) times daily for 5 days. 08/27/23 09/01/23  Yolande Lamar BROCKS, MD  levothyroxine  (SYNTHROID ) 112 MCG tablet Take 112 mcg by mouth daily before breakfast.    [provider]  linagliptin (TRADJENTA) 5 MG TABS tablet Take 5 mg by mouth daily.    [provider]  losartan  (COZAAR ) 25 MG tablet Take 1 tablet (25 mg total) by mouth daily. 12/24/21   Pokhrel, Laxman, MD  magnesium  chloride (  SLOW-MAG) 64 MG TBEC SR tablet Take 1 tablet by mouth daily as needed (muscle relaxation).    [provider]  menthol -zinc oxide (GOLD BOND) powder Apply topically See admin instructions. Apply topically twice daily to abdominal folds.    [provider]  metFORMIN  (GLUCOPHAGE ) 500 MG tablet Take 2 tablets (1,000 mg total) by mouth 2 (two) times daily with a meal. 12/24/21   Pokhrel, Laxman, MD  Tacrolimus 0.1 % CREA Apply topically.    [provider]  triamcinolone  ointment (KENALOG) 0.1 % Apply 1 Application topically 2 (two) times daily. Alternate with tacrolimus two weeks on two weeks off    [provider]  UNABLE TO FIND bariatric drop arm commode Dx) R54, E66.01, I50.9,  M19.9 07/09/19   Caro Harlene POUR, NP  UNABLE TO FIND Bariatric tub transfer bench Dx) R54, E66.01, I50.9, M19.9 07/09/19   Caro Harlene POUR, NP  UNABLE TO FIND Bariatric lift chair. Dx) R54, E66.01, I50.9, M19.9 07/09/19   Caro Harlene POUR, NP  UNABLE TO FIND Heavy duty [place] Dx) R54, E66.01, I50.9, M19.9 07/09/19   Caro Harlene POUR, NP     Objective    Physical Exam: Vitals:   07/03/24 1515 07/03/24 1520 07/03/24 1822 07/03/24 1905  BP: 137/64  (!) 131/58   Pulse: 84  85   Resp: 12  13   Temp:  (!) 97.5 F (36.4 C)  (!) 97.5 F (36.4 C)  TempSrc:  Oral  Oral  SpO2: 100%  100%   Weight:      Height:        General: appears to be stated age; alert, oriented Skin: warm, dry, no rash Head:  AT/[place] Mouth:  Oral mucosa membranes appear moist, normal dentition Neck: supple; trachea midline Heart:  RRR; did not appreciate any M/R/G Lungs: CTAB, did not appreciate any wheezes, rales, or rhonchi Abdomen: + BS; soft, distended, NT Extremities: 2+ edema in b/l le's ; no muscle wasting        Labs on Admission: I have personally reviewed following labs and imaging studies  CBC: Recent Labs  Lab 07/03/24 1530  WBC 7.5  HGB 14.1  HCT 42.2  MCV 103.2*  PLT 158   Basic Metabolic Panel: Recent Labs  Lab 07/03/24 1620  NA 132*  K 5.0  CL 95*  CO2 28  GLUCOSE 356*  BUN 15  CREATININE 1.13  CALCIUM  9.1   GFR: Estimated Creatinine Clearance: 111.2 mL/min (by C-G formula based on SCr of 1.13 mg/dL). Liver Function Tests: No results for input(s): AST, ALT, ALKPHOS, BILITOT, PROT, ALBUMIN in the last 168 hours. No results for input(s): LIPASE, AMYLASE in the last 168 hours. No results for input(s): AMMONIA in the last 168 hours. Coagulation Profile: No results for input(s): INR, PROTIME in the last 168 hours. Cardiac Enzymes: No results for input(s): CKTOTAL, CKMB, CKMBINDEX, TROPONINI in the last 168 hours. BNP (last 3  results) Recent Labs    05/03/24 1557 07/03/24 1620  PROBNP 299.0 1,802.0*   HbA1C: No results for input(s): HGBA1C in the last 72 hours. CBG: No results for input(s): GLUCAP in the last 168 hours. Lipid Profile: No results for input(s): CHOL, HDL, LDLCALC, TRIG, CHOLHDL, LDLDIRECT in the last 72 hours. Thyroid Function Tests: No results for input(s): TSH, T4TOTAL, FREET4, T3FREE, THYROIDAB in the last 72 hours. Anemia Panel: No results for input(s): VITAMINB12, FOLATE, FERRITIN, TIBC, IRON, RETICCTPCT in the last 72 hours. Urine analysis:    Component Value Date/Time  COLORURINE YELLOW 12/19/2021 1252   APPEARANCEUR CLEAR 12/19/2021 1252   APPEARANCEUR Clear 12/05/2015 1226   LABSPEC 1.037 (H) 12/19/2021 1252   PHURINE 5.0 12/19/2021 1252   GLUCOSEU >=500 (A) 12/19/2021 1252   HGBUR NEGATIVE 12/19/2021 1252   BILIRUBINUR NEGATIVE 12/19/2021 1252   BILIRUBINUR Negative 12/05/2015 1226   KETONESUR NEGATIVE 12/19/2021 1252   PROTEINUR NEGATIVE 12/19/2021 1252   UROBILINOGEN 0.2 12/28/2013 1117   NITRITE NEGATIVE 12/19/2021 1252   LEUKOCYTESUR NEGATIVE 12/19/2021 1252    Radiological Exams on Admission: CT Angio Chest PE W and/or Wo Contrast Result Date: 07/03/2024 CLINICAL DATA:  Pulmonary embolism (PE) suspected, high prob, chest pain, short of breath EXAM: CT ANGIOGRAPHY CHEST WITH CONTRAST TECHNIQUE: Multidetector CT imaging of the chest was performed using the standard protocol during bolus administration of intravenous contrast. Multiplanar CT image reconstructions and MIPs were obtained to evaluate the vascular anatomy. RADIATION DOSE REDUCTION: This exam was performed according to the departmental dose-optimization program which includes automated exposure control, adjustment of the mA and/or kV according to patient size and/or use of iterative reconstruction technique. CONTRAST:  OMNIPAQUE  IOHEXOL  350 MG/ML SOLN COMPARISON:   03/03/2025, 05/03/2024 FINDINGS: Cardiovascular: This is a technically adequate evaluation of the pulmonary vasculature. No filling defects or pulmonary emboli. The heart is unremarkable without pericardial effusion. No evidence of thoracic aortic aneurysm or dissection. Atherosclerosis of the thoracic aorta. Mediastinum/Nodes: No enlarged mediastinal, hilar, or axillary lymph nodes. Thyroid gland, trachea, and esophagus demonstrate no significant findings. Lungs/Pleura: Minimal hypoventilatory changes at the lung bases. No airspace disease, effusion, or pneumothorax. Central airways are patent. Upper Abdomen: No acute abnormality. Musculoskeletal: No acute or destructive bony abnormalities. Reconstructed images demonstrate no additional findings. Review of the MIP images confirms the above findings. IMPRESSION: 1. No evidence of pulmonary embolus. 2. No acute intrathoracic process. 3.  Aortic Atherosclerosis (ICD10-I70.0). Electronically Signed   By: Ozell Daring M.D.   On: 07/03/2024 18:23   DG Chest Port 1 View Result Date: 07/03/2024 CLINICAL DATA:  Chest pain, short of breath EXAM: PORTABLE CHEST 1 VIEW COMPARISON:  05/03/2024 FINDINGS: The heart size and mediastinal contours are within normal limits. Both lungs are clear. The visualized skeletal structures are unremarkable. IMPRESSION: No active disease. Electronically Signed   By: Ozell Daring M.D.   On: 07/03/2024 15:45      Assessment/Plan   Principal Problem:   Acute on chronic diastolic heart failure (HCC) Active Problems:   HLD (hyperlipidemia)   Morbid obesity (HCC)   DM2 (diabetes mellitus, type 2) (HCC)   SOB (shortness of breath)   Atypical chest pain   Elevated troponin   Prolonged QT interval   History of essential hypertension   Acquired hypothyroidism        #) Acute on chronic diastolic heart failure: dx of acute decompensation on the basis of presenting 4 days of progressive shortness of breath associated with  increase in peripheral edema, as well as a 6 fold interval increase in proBNP relative to most recent prior value from November 2025, as further quantified above. This is in the context of a known history of chronic diastolic heart failure, with echocardiogram in February 2018 noting grade 3 diastolic dysfunction, while most recent echocardiogram performed in July 2023 indicated preservation of LVEF at 60 to 65%, without any overt significant valvular pathology.    In terms of etiology leading to presenting acutely decompensated heart failure, suspect suboptimal afterload optimization in the context of body habitus, with presenting BMI in  excess of 70.  The patient reports good compliance with his outpatient diuretic regimen consisting of Lasix  40 mg p.o. daily, and is also on losartan  for afterload reduction.  Not on any AV nodal blocking agent in the context of a history of first-degree AV block.   I suspect that mildly elevated initial troponin is a consequence of underlying acutely decompensated heart failure as opposed to representing ACS causing presenting acute heart failure exacerbation, particularly in the absence of any recent typical CP, and with presenting EKG showing no evidence of acute ischemic changes.  Additionally, in the absence of any previous high sensitive troponin T data point, his baseline troponin level is unclear at this time.   will continue to evaluate with further trending of troponin and close monitoring on tele, with plan for updated echo in the morning.   Of note, presenting CTA chest showed no evidence of acute pulmonary embolism or any evidence of pneumothorax.  EDP has discussed patient's case with on-call cardiology, who will formally consult, and recommend in the interval to refrain from initiation of heparin  drip.   Of note, patient received Lasix  40 mg IV while in the ED today. Presentation warrants additional IV diuresis, as further detailed below, with close  monitoring of ensuing renal function, electrolytes, and volume status.  Plan: monitor strict I's & O's and daily weights. Monitor on telemetry. CMP in the morning, including for monitoring trend of potassium, bicarbonate, and renal function in response to interval diuresis efforts. Add-on serum magnesium  level, and repeat this level in the AM. Close monitoring of ensuing blood pressure response to diuresis efforts, including to help guide need for improvement in afterload reduction in order to optimize cardiac output.  Continue home losartan  for now.  Trend troponin. Lasix  40 mg IV twice daily.  Echocardiogram the morning.  Cardiology to formally consult, with additional recommendations pending at this time, as above.  Check urinary drug screen.  Repeat proBNP in the morning.  Check TSH, free T4.                      #) Atypical chest pain: Sharp intermittent left-sided chest discomfort over the last 4 days, nonexertional.  Has not taken any nitroglycerin over that timeframe.  Appears atypical for ACS. I suspect that mildly elevated initial troponin is a consequence of underlying acutely decompensated heart failure as opposed to representing ACS causing presenting acute heart failure exacerbation, particularly in the absence of any recent typical CP, and with presenting EKG showing no evidence of acute ischemic changes.  Etiology also includes potential transient relative increase in pulmonary artery hypertension as a result of left-sided heart failure. EDP has discussed patient's case with on-call cardiology, who will formally consult, and recommend in the interval to refrain from initiation of heparin  drip.   In terms of noncardiac differential, he is at increased risk for GERD in the setting of body habitus.  Will start IV Protonix  and add as needed Maalox/Mylanta.  Today CTA chest showed no evidence of acute pulmonary embolism or any evidence of acute cardiopulmonary process, including  no evidence of infiltrate or pneumothorax.  Presentation is suggestive of esophageal rupture, in the absence of any recent vomiting.  Will also check liver enzymes and lipase.   Plan: Further evaluation management of acute on chronic diastolic heart failure, as above.  Follow for result of repeat troponin level.  Monitor on symmetry.  Cardiology to formally consult, as further detailed above, with preliminary recommendation to  refrain from initiation of heparin  drip.  Add on magnesium  level.  Echocardiogram in the morning.  Prn IV fentanyl .  Protonix  40 mg IV daily, first dose now.  Prn Maalox/Mylanta.  Check CMP, lipase.  Check urinary drug screen.  Incentive spirometry.                         #) QTc prolongation: Presenting EKG demonstrates QTc of  615  ms. presenting potassium level noted to be 5.0.  Plan: Monitor on telemetry.  Add-on serum magnesium  level.  Repeat EKG in the morning to monitor interval degree of QTc prolongation.  Will initiate prn IV Ativan  as preferred antiemetic for now.                    #) Morbid obesity: By definition, criteria met for morbid obesity with presenting BMI greater than 40.  Specifically presenting BMI is found to be slightly more than 71.  Notable history given his presenting acute on chronic diastolic heart failure.  Additional notable comorbidities include acquired hypothyroidism as well as type 2 diabetes mellitus.  Plan: Add on TSH, free T4.  Follow-up results of hemoglobin A1c level.  Monitor strict I's and O's and daily weights.                        #) Type 2 Diabetes Mellitus: documented history of such. Home insulin  regimen: None. Home oral hypoglycemic agents: Tradjenta, canagliflozin, metformin  . presenting blood sugar:.  56, without any evidence of DKA in the absence of evidence of an anion gap metabolic acidosis. Most recent A1c noted to be 7.2% when checked in July 2023.  Plan:  accuchecks QAC and HS with low dose SSI.  Add on hemoglobin A1c level. hold home oral hypoglycemic agents during this hospitalization.                         #) Essential Hypertension: documented h/o such, with outpatient antihypertensive regimen including losartan .  SBP's in the ED today: 130s mmHg.   Plan: Close monitoring of subsequent BP via routine VS. resume home losartan .  Monitor strict I's and O's and daily weights.                          #) Hyperlipidemia: documented h/o such. On high intensity atorvastatin  as well as fenofibrate  as outpatient.   Plan: continue home statin and fenofibrate .                          #) acquired hypothyroidism: documented h/o such, on Synthroid  as outpatient.   Plan: cont home Synthroid .  Add on TSH, free T4.          DVT prophylaxis: SCD's   Code Status: Full code Family Communication: none Disposition Plan: Per Rounding Team Consults called: EDP has discussed patient's case with on-call cardiology, who will formally consult, and recommend in the interval to refrain from initiation of heparin  drip.;  Admission status: obs     I SPENT GREATER THAN 75  MINUTES IN CLINICAL CARE TIME/MEDICAL DECISION-MAKING IN COMPLETING THIS ADMISSION.      Wiktoria Hemrick B Jessamy Torosyan DO [facility]  From 7PM - 7AM   07/03/2024, 8:00 PM         [1]  Allergies Allergen Reactions   Uloric  [Febuxostat ] Other (See Comments)  Made the skin on feet and legs hurt   "

## 2024-07-03 NOTE — ED Provider Notes (Signed)
 " [place] [facility] Provider Note   CSN: [id number] Arrival date & time: 07/03/24  1501     Patient presents with: No chief complaint on file.   Ronnie Hernandez is a 64 y.o. male.  He is brought in by ambulance from his clinic for evaluation of chest pain and shortness of breath.  He said he has been having on and off pain and pressure in his chest radiating through to his back and shortness of breath it has been going on since Saturday.  Comes and goes.  Associated with diaphoresis, feeling hot and cold.  He also has noticed that his feet are more swollen than normal.  He has a history of PE, diabetes, CHF, CKD, chronic lower extremity edema and is wheelchair-bound.  He had a CT angio 2 months ago that was negative for PE.   The history is provided by the patient and the EMS personnel.  Chest Pain Pain location:  Substernal area Pain quality: aching and pressure   Pain radiates to:  Mid back Pain severity:  Moderate Onset quality:  Gradual Duration:  4 days Timing:  Intermittent Progression:  Resolved Chronicity:  New Relieved by:  Nothing Ineffective treatments:  Certain positions and leaning forward Associated symptoms: diaphoresis and shortness of breath   Associated symptoms: no abdominal pain, no cough, no fever, no nausea and no vomiting   Risk factors: diabetes mellitus, male sex and prior DVT/PE        Prior to Admission medications  Medication Sig Start Date End Date Taking? Authorizing Provider  albuterol  (VENTOLIN  HFA) 108 (90 Base) MCG/ACT inhaler Inhale 1-2 puffs into the lungs every 6 (six) hours as needed for wheezing or shortness of breath.    [provider]  allopurinol  (ZYLOPRIM ) 300 MG tablet Take 1 tablet (300 mg total) by mouth 2 (two) times daily. Patient taking differently: Take 800 mg by mouth daily. 08/27/19   Caro Harlene POUR, NP  ascorbic acid  (VITAMIN C) 500 MG tablet Take 500 mg by mouth daily.     [provider]  atorvastatin  (LIPITOR ) 80 MG tablet Take 80 mg by mouth daily.    [provider]  canagliflozin (INVOKANA) 300 MG TABS tablet Take 300 mg by mouth daily.    [provider]  Cholecalciferol (VITAMIN D3) 50 MCG (2000 UT) CAPS Take 2,000 Units by mouth daily.    [provider]  colchicine  0.6 MG tablet Take 1 tablet (0.6 mg total) by mouth daily. Patient taking differently: Take 0.6 mg by mouth 2 (two) times daily. 04/04/18   Jillian Buttery, MD  fenofibrate  160 MG tablet Take 160 mg by mouth daily.    [provider]  fluticasone  (FLONASE ) 50 MCG/ACT nasal spray Place 1 spray into both nostrils daily as needed for allergies or rhinitis.    [provider]  furosemide  (LASIX ) 40 MG tablet Take one tablet by mouth once daily 12/22/21   Pokhrel, Laxman, MD  furosemide  (LASIX ) 40 MG tablet Take 1 tablet (40 mg total) by mouth 2 (two) times daily for 5 days. 08/27/23 09/01/23  Yolande Lamar JAYSON, MD  levothyroxine  (SYNTHROID ) 112 MCG tablet Take 112 mcg by mouth daily before breakfast.    [provider]  linagliptin (TRADJENTA) 5 MG TABS tablet Take 5 mg by mouth daily.    [provider]  losartan  (COZAAR ) 25 MG tablet Take 1 tablet (25 mg total) by mouth daily. 12/24/21   Pokhrel, Laxman,  MD  magnesium  chloride (SLOW-MAG) 64 MG TBEC SR tablet Take 1 tablet by mouth daily as needed (muscle relaxation).    [provider]  menthol -zinc oxide (GOLD BOND) powder Apply topically See admin instructions. Apply topically twice daily to abdominal folds.    [provider]  metFORMIN  (GLUCOPHAGE ) 500 MG tablet Take 2 tablets (1,000 mg total) by mouth 2 (two) times daily with a meal. 12/24/21   Pokhrel, Laxman, MD  Tacrolimus 0.1 % CREA Apply topically.    [provider]  triamcinolone  ointment (KENALOG) 0.1 % Apply 1 Application topically 2 (two) times daily. Alternate with tacrolimus two weeks on two  weeks off    [provider]  UNABLE TO FIND bariatric drop arm commode Dx) R54, E66.01, I50.9, M19.9 07/09/19   Caro Harlene POUR, NP  UNABLE TO FIND Bariatric tub transfer bench Dx) R54, E66.01, I50.9, M19.9 07/09/19   Caro Harlene POUR, NP  UNABLE TO FIND Bariatric lift chair. Dx) R54, E66.01, I50.9, M19.9 07/09/19   Caro Harlene POUR, NP  UNABLE TO FIND Heavy duty Cane Dx) R54, E66.01, I50.9, M19.9 07/09/19   Caro Harlene POUR, NP    Allergies: Uloric  [febuxostat ]    Review of Systems  Constitutional:  Positive for diaphoresis. Negative for fever.  Respiratory:  Positive for shortness of breath. Negative for cough.   Cardiovascular:  Positive for chest pain and leg swelling.  Gastrointestinal:  Negative for abdominal pain, nausea and vomiting.    Updated Vital Signs BP 137/64   Pulse 84   Temp (!) 97.5 F (36.4 C) (Oral)   Resp 12   Ht 5' 6 (1.676 m)   Wt (!) 201.9 kg   SpO2 100%   BMI 71.82 kg/m   Physical Exam Vitals and nursing note reviewed.  Constitutional:      General: He is not in acute distress.    Appearance: Normal appearance. He is well-developed. He is obese.  HENT:     Head: Normocephalic and atraumatic.  Eyes:     Conjunctiva/sclera: Conjunctivae normal.  Cardiovascular:     Rate and Rhythm: Normal rate and regular rhythm.     Heart sounds: No murmur heard. Pulmonary:     Effort: Pulmonary effort is normal. No respiratory distress.     Breath sounds: Normal breath sounds.  Abdominal:     Palpations: Abdomen is soft.     Tenderness: There is no abdominal tenderness. There is no guarding or rebound.  Musculoskeletal:     Cervical back: Neck supple.     Right lower leg: Edema present.     Left lower leg: Edema present.  Skin:    General: Skin is warm and dry.     Capillary Refill: Capillary refill takes less than 2 seconds.  Neurological:     General: No focal deficit present.     Mental Status: He is alert.     (all labs ordered  are listed, but only abnormal results are displayed) Labs Reviewed  CBC - Abnormal; Notable for the following components:      Result Value   RBC 4.09 (*)    MCV 103.2 (*)    MCH 34.5 (*)    All other components within normal limits  BASIC METABOLIC PANEL WITH GFR - Abnormal; Notable for the following components:   Sodium 132 (*)    Chloride 95 (*)    Glucose, Bld 356 (*)    All other components within normal limits  PRO BRAIN NATRIURETIC PEPTIDE -  Abnormal; Notable for the following components:   Pro Brain Natriuretic Peptide 1,802.0 (*)    All other components within normal limits  TROPONIN T, HIGH SENSITIVITY - Abnormal; Notable for the following components:   Troponin T High Sensitivity 105 (*)    All other components within normal limits  TROPONIN T, HIGH SENSITIVITY    EKG: EKG Interpretation Date/Time:  Tuesday July 03 2024 15:17:01 EST Ventricular Rate:  84 PR Interval:    QRS Duration:  110 QT Interval:  520 QTC Calculation: 615 R Axis:   -40  Text Interpretation: sinus with first degree block Left axis deviation Anteroseptal infarct, old Prolonged QT interval No significant change since last tracing Confirmed by Towana Sharper [phone] on 07/03/2024 3:30:12 PM  Radiology: [facility] Chest Port 1 View Result Date: 07/03/2024 CLINICAL DATA:  Chest pain, short of breath EXAM: PORTABLE CHEST 1 VIEW COMPARISON:  05/03/2024 FINDINGS: The heart size and mediastinal contours are within normal limits. Both lungs are clear. The visualized skeletal structures are unremarkable. IMPRESSION: No active disease. Electronically Signed   By: Sharper Daring M.D.   On: 07/03/2024 15:45     Procedures   Medications Ordered in the ED - No data to display                                  Medical Decision Making Amount and/or Complexity of Data Reviewed Labs: ordered. Radiology: ordered.   This patient complains of on and off chest pain and shortness of breath for a few days; this  involves an extensive number of treatment Options and is a complaint that carries with it a high risk of complications and morbidity. The differential includes ACS, musculoskeletal, CHF,  I ordered, reviewed and interpreted labs, which included CBC with normal white count, chemistries with elevated glucose, troponins elevated, BNP elevated I ordered imaging studies which included chest x-ray and I independently    visualized and interpreted imaging which showed no acute infiltrate Additional history obtained from EMS Previous records obtained and reviewed in epic including recent ED evaluation in November Cardiac monitoring reviewed, first-degree AV block Social determinants considered, no significant barriers Critical Interventions: None  After the interventions stated above, I reevaluated the patient and found patient to be hemodynamically stable and in no distress satting 100% on room air Admission and further testing considered, his care is signed out to oncoming provider Dr. Laurice to follow-up on results of lab work.  Disposition per results of testing.      Final diagnoses:  None    ED Discharge Orders     None          Towana Sharper BROCKS, MD 07/03/24 1736  "

## 2024-07-03 NOTE — ED Provider Notes (Signed)
 Patient seen after prior ED provider.  Patient with reported chest discomfort x 3 days.  EKG does not suggest acute ischemia.  Troponin is 105.  BNP is also elevated.  CT angio shows no PE.  Cardiology is aware of case and will consult.  Patient would benefit from admission.  Medicine team made aware of case and evaluate for admission.   Laurice Maude BROCKS, MD 07/03/24 [phone]

## 2024-07-03 NOTE — Consult Note (Addendum)
 "  Cardiology Consultation   Patient ID: Ronnie Hernandez MRN: [id number]; DOB: 07/06/1960  Admit date: 07/03/2024 Date of Consult: 07/03/2024  PCP:  Freddrick No   [facility] Providers Cardiologist: Dr Kate   Patient Profile: Ronnie Hernandez is a 64 y.o. male with a hx of chronic diastolic heart failure, HTN, RA, type 2 DM, morbid obesity, HLD, PE, gout, CKD, who is being seen 07/03/2024 for the evaluation of chest pain and CHF at the request of Dr Laurice.  History of Present Illness: Mr. Crewe with above past medical history presented to the ER today complaining chest pain.  He started having chest pain Saturday morning when he woke up.  Pain was severe, woke him up from sleep, pressure-like, radiating to his back.  Pain was somewhat positional and relieved by heating pad to his back and raising his arms. Pain returned today and he came to ER.  He is chest pain free, reported pain when chest is touched. He has chronic BLE edema, recently noted legs are weeping. He has chronic SOB, felt this is worse lately. He endorses orthopnea and can't lay flat. He denied any bleeding. He denied any allergy. He denied hx of MI or CVA. He is limited historian due to limited insight. He walks with cane, does not do much activity at baseline.   Per ER workup, BMP revealed hyponatremia 132, glucose 356, creatinine 1.13, GFR >60.  proBNP 1802.  High sensitive troponin 105 X1.  CTA chest showed no acute findings.  Cardiology is consulted for chest pain and CHF today.   He had seen Dr Kate in 02/2019 for chronic diastolic heart failure, reportedly have chronic lower extremity edema due to chronic venous stasis, was maintained lisinopril  for hypertension and Lasix  40 mg daily.    Echocardiogram dating back to 01/2016 revealed LVEF 60 to 65%, no RWMA, normal diastolic parameter.  Repeat echo from 08/06/2016 showed LVEF 60 to 65%, no RWMA, reversible restrictive pattern indicating decreased left  ventricular diastolic compliance.  Repeat echo from 08/26/2018 showed LVEF 60 to 65%, mild LVH, normal RV, no significant valvular disease.  Another echo from 06/04/2020 showed LVEF 60 to 65%, indeterminate diastolic parameter, mild aortic sclerosis.  Most recent echo from 12/21/2021 showed LVEF 60 to 65%, no RWMA, normal diastolic parameter, RV not well-visualized for assessment, no significant valvular disease.  He was seen by Dr. Barnetta 08/31/2019 via telemedicine, continued Lasix  40 mg daily and lisinopril  2.5 mg daily.     Past Medical History:  Diagnosis Date   Arthritis    knees, hands (04/19/2017)   Cellulitis 05/2020   bilateral legs   Childhood asthma    Diabetes type 2, uncontrolled    Gout    have had it in both feet, both hands, right shoulder, back (04/19/2017)   Hyperlipidemia    Hypertension    Morbidly obese (HCC)    Pulmonary embolism (HCC) 2017?    Past Surgical History:  Procedure Laterality Date   COLONOSCOPY W/ BIOPSIES AND POLYPECTOMY  07/2011   thelbert 07/27/2011     Home Medications:  Prior to Admission medications  Medication Sig Start Date End Date Taking? Authorizing Provider  albuterol  (VENTOLIN  HFA) 108 (90 Base) MCG/ACT inhaler Inhale 1-2 puffs into the lungs every 6 (six) hours as needed for wheezing or shortness of breath.    [provider]  allopurinol  (ZYLOPRIM ) 300 MG tablet Take 1 tablet (300 mg total) by mouth 2 (two) times daily. Patient taking differently:  Take 800 mg by mouth daily. 08/27/19   Caro Harlene POUR, NP  ascorbic acid  (VITAMIN C) 500 MG tablet Take 500 mg by mouth daily.    [provider]  atorvastatin  (LIPITOR ) 80 MG tablet Take 80 mg by mouth daily.    [provider]  canagliflozin (INVOKANA) 300 MG TABS tablet Take 300 mg by mouth daily.    [provider]  Cholecalciferol (VITAMIN D3) 50 MCG (2000 UT) CAPS Take 2,000 Units by mouth daily.    [provider]  colchicine  0.6 MG  tablet Take 1 tablet (0.6 mg total) by mouth daily. Patient taking differently: Take 0.6 mg by mouth 2 (two) times daily. 04/04/18   Jillian Buttery, MD  fenofibrate  160 MG tablet Take 160 mg by mouth daily.    [provider]  fluticasone  (FLONASE ) 50 MCG/ACT nasal spray Place 1 spray into both nostrils daily as needed for allergies or rhinitis.    [provider]  furosemide  (LASIX ) 40 MG tablet Take one tablet by mouth once daily 12/22/21   Pokhrel, Laxman, MD  furosemide  (LASIX ) 40 MG tablet Take 1 tablet (40 mg total) by mouth 2 (two) times daily for 5 days. 08/27/23 09/01/23  Yolande Lamar BROCKS, MD  levothyroxine  (SYNTHROID ) 112 MCG tablet Take 112 mcg by mouth daily before breakfast.    [provider]  linagliptin (TRADJENTA) 5 MG TABS tablet Take 5 mg by mouth daily.    [provider]  losartan  (COZAAR ) 25 MG tablet Take 1 tablet (25 mg total) by mouth daily. 12/24/21   Pokhrel, Vernal, MD  magnesium  chloride (SLOW-MAG) 64 MG TBEC SR tablet Take 1 tablet by mouth daily as needed (muscle relaxation).    [provider]  menthol -zinc oxide (GOLD BOND) powder Apply topically See admin instructions. Apply topically twice daily to abdominal folds.    [provider]  metFORMIN  (GLUCOPHAGE ) 500 MG tablet Take 2 tablets (1,000 mg total) by mouth 2 (two) times daily with a meal. 12/24/21   Pokhrel, Laxman, MD  Tacrolimus 0.1 % CREA Apply topically.    [provider]  triamcinolone  ointment (KENALOG) 0.1 % Apply 1 Application topically 2 (two) times daily. Alternate with tacrolimus two weeks on two weeks off    [provider]  UNABLE TO FIND bariatric drop arm commode Dx) R54, E66.01, I50.9, M19.9 07/09/19   Caro Harlene POUR, NP  UNABLE TO FIND Bariatric tub transfer bench Dx) R54, E66.01, I50.9, M19.9 07/09/19   Caro Harlene POUR, NP  UNABLE TO FIND Bariatric lift chair. Dx) R54, E66.01, I50.9, M19.9 07/09/19   Caro Harlene POUR, NP   UNABLE TO FIND Heavy duty Cane Dx) R54, E66.01, I50.9, M19.9 07/09/19   Caro Harlene POUR, NP    Scheduled Meds:  Continuous Infusions:  PRN Meds:   Allergies:   Allergies[1]  Social History:   Social History   Socioeconomic History   Marital status: Single    Spouse name: Not on file   Number of children: Not on file   Years of education: Not on file   Highest education level: Not on file  Occupational History   Not on file  Tobacco Use   Smoking status: Former    Current packs/day: 0.00    Types: Cigarettes    Quit date: 02/09/1982    Years since quitting: 42.4   Smokeless tobacco: Never  Vaping Use   Vaping status: Never Used  Substance and Sexual Activity   Alcohol use: No  Drug use: Not Currently    Comment: 04/19/2017 nothing since I was 21   Sexual activity: Never  Other Topics Concern   Not on file  Social History Narrative   Not on file   Social Drivers of Health   Tobacco Use: Medium Risk (07/03/2024)   Patient History    Smoking Tobacco Use: Former    Smokeless Tobacco Use: Never    Passive Exposure: Not on Actuary Strain: Not on file  Food Insecurity: Not on file  Transportation Needs: Not on file  Physical Activity: Not on file  Stress: Not on file  Social Connections: Not on file  Intimate Partner Violence: Not on file  Depression (PHQ2-9): Not on file  Alcohol Screen: Not on file  Housing: Not on file  Utilities: Not on file  Health Literacy: Not on file    Family History:    Family History  Problem Relation Age of Onset   Colon cancer Maternal Grandfather 92   Heart attack Father    Stomach cancer Maternal Aunt 80     ROS:  Constitutional: Denied fever, chills, malaise, night sweats Eyes: Denied vision change or loss Ears/Nose/Mouth/Throat: Denied ear ache, sore throat, coughing, sinus pain Cardiovascular: see HPI  Respiratory: see HPI  Gastrointestinal: Denied nausea, vomiting, abdominal pain,  diarrhea Genital/Urinary: Denied dysuria, hematuria, urinary frequency/urgency Musculoskeletal: Denied muscle ache, joint pain, weakness Skin: Denied rash, wound Neuro: Denied headache, dizziness, syncope Psych: Denied history of depression/anxiety  Endocrine: history of diabetes   Physical Exam/Data: Vitals:   07/03/24 1508 07/03/24 1515 07/03/24 1520 07/03/24 1822  BP:  137/64  (!) 131/58  Pulse:  84  85  Resp:  12  13  Temp:   (!) 97.5 F (36.4 C)   TempSrc:   Oral   SpO2:  100%  100%  Weight: (!) 201.9 kg     Height: 5' 6 (1.676 m)      No intake or output data in the 24 hours ending 07/03/24 1839    07/03/2024    3:08 PM 05/03/2024    1:18 PM 01/22/2024    2:15 PM  Last 3 Weights  Weight (lbs) 445 lb 445 lb 440 lb 0.6 oz  Weight (kg) 201.851 kg 201.851 kg 199.6 kg     Body mass index is 71.82 kg/m.   Vitals:  Vitals:   07/03/24 1822 07/03/24 1905  BP: (!) 131/58   Pulse: 85   Resp: 13   Temp:  (!) 97.5 F (36.4 C)  SpO2: 100%    General Appearance: In no apparent distress, laying in bed, morbidly obese  HEENT: Normocephalic, atraumatic. Neck: difficult assess JVD Cardiovascular: Regular rate and rhythm, normal S1-S2,  no murmur Respiratory: Resting breathing unlabored, lungs sounds clear to auscultation bilaterally, no use of accessory muscles. On room air.   Gastrointestinal: Bowel sounds positive, abdomen soft Extremities: Able to move all extremities in bed without difficulty, BLE chronic edema with venous stasis changes Musculoskeletal: Normal muscle bulk and tone, Skin: Intact, warm, dry.  Neurologic: Alert, oriented to person, place and time. no gross focal neuro deficit Psychiatric: Normal affect. Mood is appropriate.      EKG:  The EKG was personally reviewed and demonstrates:    EKG from 07/03/2024 15:17 showed sinus rhythm 84 bpm, first-degree AV block, nonspecific T wave abnormality of lateral leads, anteroseptal Q, manual QT 473 msec    Telemetry:  Telemetry was personally reviewed and demonstrates:    Sinus rhythm, first  degree AVB, PVC  Relevant CV Studies:   Echo from 12/20/21:   1. Extremely limited due to poor sound wave transmission; full doppler  study not performed; LV function appears to be preserved.   2. Left ventricular ejection fraction, by estimation, is 60 to 65%. The  left ventricle has normal function. The left ventricle has no regional  wall motion abnormalities. Left ventricular diastolic parameters were  normal.   3. Right ventricular systolic function was not well visualized. The right  ventricular size is not well visualized.   4. The mitral valve is normal in structure. No evidence of mitral valve  regurgitation.   5. Tricuspid valve regurgitation Not well visualized.   6. The aortic valve was not well visualized. Aortic valve regurgitation  Not well visualized.   7. Pulmonic valve regurgitation Not well visualized.    Echo from 06/04/2020:   1. Left ventricular ejection fraction, by estimation, is 60 to 65%. The  left ventricle has normal function. Left ventricular endocardial border  not optimally defined to evaluate regional wall motion. Left ventricular  diastolic parameters are  indeterminate. Elevated left ventricular end-diastolic pressure.   2. Right ventricular systolic function is normal. The right ventricular  size is normal. Tricuspid regurgitation signal is inadequate for assessing  PA pressure.   3. The mitral valve is normal in structure. No evidence of mitral valve  regurgitation. No evidence of mitral stenosis.   4. The aortic valve is tricuspid. Aortic valve regurgitation is not  visualized. Mild aortic valve sclerosis is present, with no evidence of  aortic valve stenosis.    Laboratory Data: High Sensitivity Troponin:  No results for input(s): TROPONINIHS in the last 720 hours.  Recent Labs  Lab 07/03/24 1620  TRNPT 105*      Chemistry Recent Labs  Lab  07/03/24 1620  NA 132*  K 5.0  CL 95*  CO2 28  GLUCOSE 356*  BUN 15  CREATININE 1.13  CALCIUM  9.1  GFRNONAA >60  ANIONGAP 10    No results for input(s): PROT, ALBUMIN, AST, ALT, ALKPHOS, BILITOT in the last 168 hours. Lipids No results for input(s): CHOL, TRIG, HDL, LABVLDL, LDLCALC, CHOLHDL in the last 168 hours.  Hematology Recent Labs  Lab 07/03/24 1530  WBC 7.5  RBC 4.09*  HGB 14.1  HCT 42.2  MCV 103.2*  MCH 34.5*  MCHC 33.4  RDW 12.8  PLT 158   Thyroid No results for input(s): TSH, FREET4 in the last 168 hours.  BNP Recent Labs  Lab 07/03/24 1620  PROBNP 1,802.0*    DDimer No results for input(s): DDIMER in the last 168 hours.  Radiology/Studies:  CT Angio Chest PE W and/or Wo Contrast Result Date: 07/03/2024 CLINICAL DATA:  Pulmonary embolism (PE) suspected, high prob, chest pain, short of breath EXAM: CT ANGIOGRAPHY CHEST WITH CONTRAST TECHNIQUE: Multidetector CT imaging of the chest was performed using the standard protocol during bolus administration of intravenous contrast. Multiplanar CT image reconstructions and MIPs were obtained to evaluate the vascular anatomy. RADIATION DOSE REDUCTION: This exam was performed according to the departmental dose-optimization program which includes automated exposure control, adjustment of the mA and/or kV according to patient size and/or use of iterative reconstruction technique. CONTRAST:  OMNIPAQUE  IOHEXOL  350 MG/ML SOLN COMPARISON:  03/03/2025, 05/03/2024 FINDINGS: Cardiovascular: This is a technically adequate evaluation of the pulmonary vasculature. No filling defects or pulmonary emboli. The heart is unremarkable without pericardial effusion. No evidence of thoracic aortic aneurysm or dissection. Atherosclerosis of  the thoracic aorta. Mediastinum/Nodes: No enlarged mediastinal, hilar, or axillary lymph nodes. Thyroid gland, trachea, and esophagus demonstrate no significant findings.  Lungs/Pleura: Minimal hypoventilatory changes at the lung bases. No airspace disease, effusion, or pneumothorax. Central airways are patent. Upper Abdomen: No acute abnormality. Musculoskeletal: No acute or destructive bony abnormalities. Reconstructed images demonstrate no additional findings. Review of the MIP images confirms the above findings. IMPRESSION: 1. No evidence of pulmonary embolus. 2. No acute intrathoracic process. 3.  Aortic Atherosclerosis (ICD10-I70.0). Electronically Signed   By: Ozell Daring M.D.   On: 07/03/2024 18:23   DG Chest Port 1 View Result Date: 07/03/2024 CLINICAL DATA:  Chest pain, short of breath EXAM: PORTABLE CHEST 1 VIEW COMPARISON:  05/03/2024 FINDINGS: The heart size and mediastinal contours are within normal limits. Both lungs are clear. The visualized skeletal structures are unremarkable. IMPRESSION: No active disease. Electronically Signed   By: Ozell Daring M.D.   On: 07/03/2024 15:45     Assessment and Plan:  NSTEMI - chest pain resolved, seems positional and relieved by heat, atypical  - Hs trop 105 x1, will trend - no heparin  gtt indicated at this time - start ASA 81mg  daily - check lipid, A1C, Echo - telemetry monitor   Acute on chronic diastolic heart failure  - exam difficult - check Echo - on PTA lasix  40mg  BID, will start IV lasix  60mg  BID  - leg edema not solely cardiac related  - GDMT: may trial SGLT2I although high risk of GU infection    Risk Assessment/Risk Scores:  New [place]  Heart Association (NYHA) Functional Class NYHA Class II   For questions or updates, please contact [facility] Please consult [email or web address] for contact info under      Signed, Xika Zhao, NP  07/03/2024 6:39 PM  Patient seen and examined, note reviewed with the signed Advanced Practice Provider. I personally reviewed laboratory data, imaging studies and relevant notes. I independently examined the patient and formulated the important  aspects of the plan. I have personally discussed the plan with the patient and/or family. Comments or changes to the note/plan are indicated below.  Patient profile: This is a 64 year old male with past medical history of chronic diastolic heart failure, morbid obesity, hypertension, RA, type 2 diabetes, gout, CKD from cardiology was consulted for chest pain and concern for heart failure exacerbation.  My Exam:  Physical Exam Vitals and nursing note reviewed.  Constitutional:      Appearance: Normal appearance. He is obese.  HENT:     Head: Normocephalic and atraumatic.  Eyes:     Conjunctiva/sclera: Conjunctivae normal.  Cardiovascular:     Rate and Rhythm: Normal rate and regular rhythm.     Heart sounds: Murmur heard.     Comments: Chronic bilateral lower extremity lymphedema with pitting of thighs posteriorly. Abdomen also has edema Pulmonary:     Effort: Pulmonary effort is normal.  Skin:    Coloration: Skin is not jaundiced or pale.  Neurological:     Mental Status: He is alert.      Telemetry: Sinus rhythm with first-degree AV block and PVCs- Personally reviewed EKG: Sinus rhythm with first-degree AV block and prolonged PR and long QT- Personally reviewed   Assessment & Plan:  Acute on chronic diastolic heart failure with difficult volume assessment due to body habitus- significantly volume overloaded. Received Lasix  40 mg IV x 1 in the ED. Will give an additional 40 mg IV x 1 now and continue with Lasix  80 mg  IV BID  tomorrow NSTEMI, likely demand ischemia heart failure-  trend troponin.  Echocardiogram Hyponatremia-  likely from volume overload Morbid obesity Type 2 diabetes with hyperglycemia CKD Prolonged QT avoid QT prolonging agents   Signed, Emeline Calender, DO [place]  [facility]  07/03/2024 8:16 PM        [1]  Allergies Allergen Reactions   Uloric  [Febuxostat ] Other (See Comments)    Made the skin on feet and legs hurt   "

## 2024-07-03 NOTE — ED Triage Notes (Signed)
 PT bib [facility] with complaints of chest pain that started on Saturday that woke him out of his sleep. Acute onset of pressure in his chest radiating to his back, pressure was relieved when he lifted his arm over his head. The same thing happened /again this morning  chest pressure that radiated to the back. Applied heating pad and helped a little but did not completely resolve. EKG changed per EMS.    Nitroglycerin 2 tabs Aspirin  324mg     145/84 97%RA  90 HR 369 CBG  20 GA LH

## 2024-07-04 ENCOUNTER — Observation Stay (HOSPITAL_COMMUNITY): Payer: Medicare (Managed Care)

## 2024-07-04 DIAGNOSIS — Z6841 Body Mass Index (BMI) 40.0 and over, adult: Secondary | ICD-10-CM | POA: Diagnosis not present

## 2024-07-04 DIAGNOSIS — Z79899 Other long term (current) drug therapy: Secondary | ICD-10-CM | POA: Diagnosis not present

## 2024-07-04 DIAGNOSIS — I471 Supraventricular tachycardia, unspecified: Secondary | ICD-10-CM | POA: Diagnosis not present

## 2024-07-04 DIAGNOSIS — Z87891 Personal history of nicotine dependence: Secondary | ICD-10-CM | POA: Diagnosis not present

## 2024-07-04 DIAGNOSIS — E1122 Type 2 diabetes mellitus with diabetic chronic kidney disease: Secondary | ICD-10-CM | POA: Diagnosis present

## 2024-07-04 DIAGNOSIS — Z79890 Hormone replacement therapy (postmenopausal): Secondary | ICD-10-CM | POA: Diagnosis not present

## 2024-07-04 DIAGNOSIS — E039 Hypothyroidism, unspecified: Secondary | ICD-10-CM | POA: Diagnosis present

## 2024-07-04 DIAGNOSIS — I5033 Acute on chronic diastolic (congestive) heart failure: Secondary | ICD-10-CM | POA: Diagnosis present

## 2024-07-04 DIAGNOSIS — E785 Hyperlipidemia, unspecified: Secondary | ICD-10-CM | POA: Diagnosis present

## 2024-07-04 DIAGNOSIS — N179 Acute kidney failure, unspecified: Secondary | ICD-10-CM | POA: Diagnosis present

## 2024-07-04 DIAGNOSIS — Z800 Family history of malignant neoplasm of digestive organs: Secondary | ICD-10-CM | POA: Diagnosis not present

## 2024-07-04 DIAGNOSIS — N1832 Chronic kidney disease, stage 3b: Secondary | ICD-10-CM | POA: Diagnosis present

## 2024-07-04 DIAGNOSIS — I130 Hypertensive heart and chronic kidney disease with heart failure and stage 1 through stage 4 chronic kidney disease, or unspecified chronic kidney disease: Secondary | ICD-10-CM | POA: Diagnosis present

## 2024-07-04 DIAGNOSIS — K5900 Constipation, unspecified: Secondary | ICD-10-CM | POA: Diagnosis not present

## 2024-07-04 DIAGNOSIS — I5031 Acute diastolic (congestive) heart failure: Secondary | ICD-10-CM

## 2024-07-04 DIAGNOSIS — Z993 Dependence on wheelchair: Secondary | ICD-10-CM | POA: Diagnosis not present

## 2024-07-04 DIAGNOSIS — Z8249 Family history of ischemic heart disease and other diseases of the circulatory system: Secondary | ICD-10-CM | POA: Diagnosis not present

## 2024-07-04 DIAGNOSIS — M069 Rheumatoid arthritis, unspecified: Secondary | ICD-10-CM | POA: Diagnosis present

## 2024-07-04 DIAGNOSIS — Z86711 Personal history of pulmonary embolism: Secondary | ICD-10-CM | POA: Diagnosis not present

## 2024-07-04 DIAGNOSIS — E1165 Type 2 diabetes mellitus with hyperglycemia: Secondary | ICD-10-CM | POA: Diagnosis present

## 2024-07-04 DIAGNOSIS — Z7984 Long term (current) use of oral hypoglycemic drugs: Secondary | ICD-10-CM | POA: Diagnosis not present

## 2024-07-04 DIAGNOSIS — M109 Gout, unspecified: Secondary | ICD-10-CM | POA: Diagnosis present

## 2024-07-04 DIAGNOSIS — E871 Hypo-osmolality and hyponatremia: Secondary | ICD-10-CM | POA: Diagnosis present

## 2024-07-04 DIAGNOSIS — I4891 Unspecified atrial fibrillation: Secondary | ICD-10-CM | POA: Diagnosis present

## 2024-07-04 LAB — CBC WITH DIFFERENTIAL/PLATELET
Abs Immature Granulocytes: 0.03 K/uL (ref 0.00–0.07)
Basophils Absolute: 0 K/uL (ref 0.0–0.1)
Basophils Relative: 0 %
Eosinophils Absolute: 0.3 K/uL (ref 0.0–0.5)
Eosinophils Relative: 4 %
HCT: 42.3 % (ref 39.0–52.0)
Hemoglobin: 14.1 g/dL (ref 13.0–17.0)
Immature Granulocytes: 0 %
Lymphocytes Relative: 30 %
Lymphs Abs: 2.6 K/uL (ref 0.7–4.0)
MCH: 33.6 pg (ref 26.0–34.0)
MCHC: 33.3 g/dL (ref 30.0–36.0)
MCV: 100.7 fL — ABNORMAL HIGH (ref 80.0–100.0)
Monocytes Absolute: 0.4 K/uL (ref 0.1–1.0)
Monocytes Relative: 5 %
Neutro Abs: 5.4 K/uL (ref 1.7–7.7)
Neutrophils Relative %: 61 %
Platelets: 186 K/uL (ref 150–400)
RBC: 4.2 MIL/uL — ABNORMAL LOW (ref 4.22–5.81)
RDW: 12.8 % (ref 11.5–15.5)
WBC: 8.9 K/uL (ref 4.0–10.5)
nRBC: 0 % (ref 0.0–0.2)

## 2024-07-04 LAB — ECHOCARDIOGRAM COMPLETE
Est EF: >75
Height: 66 in
S' Lateral: 3.4 cm
Single Plane A4C EF: 35 %
Weight: 7120 [oz_av]

## 2024-07-04 LAB — COMPREHENSIVE METABOLIC PANEL WITH GFR
ALT: 45 U/L — ABNORMAL HIGH (ref 0–44)
AST: 56 U/L — ABNORMAL HIGH (ref 15–41)
Albumin: 3.2 g/dL — ABNORMAL LOW (ref 3.5–5.0)
Alkaline Phosphatase: 125 U/L (ref 38–126)
Anion gap: 11 (ref 5–15)
BUN: 16 mg/dL (ref 8–23)
CO2: 26 mmol/L (ref 22–32)
Calcium: 9 mg/dL (ref 8.9–10.3)
Chloride: 95 mmol/L — ABNORMAL LOW (ref 98–111)
Creatinine, Ser: 1.07 mg/dL (ref 0.61–1.24)
GFR, Estimated: >60 mL/min
Glucose, Bld: 298 mg/dL — ABNORMAL HIGH (ref 70–99)
Potassium: 4.1 mmol/L (ref 3.5–5.1)
Sodium: 133 mmol/L — ABNORMAL LOW (ref 135–145)
Total Bilirubin: 0.6 mg/dL (ref 0.0–1.2)
Total Protein: 7.2 g/dL (ref 6.5–8.1)

## 2024-07-04 LAB — MAGNESIUM: Magnesium: 1.6 mg/dL — ABNORMAL LOW (ref 1.7–2.4)

## 2024-07-04 LAB — LIPASE, BLOOD: Lipase: 18 U/L (ref 11–51)

## 2024-07-04 LAB — GLUCOSE, CAPILLARY
Glucose-Capillary: 243 mg/dL — ABNORMAL HIGH (ref 70–99)
Glucose-Capillary: 297 mg/dL — ABNORMAL HIGH (ref 70–99)

## 2024-07-04 LAB — TSH: TSH: 8.13 u[IU]/mL — ABNORMAL HIGH (ref 0.350–4.500)

## 2024-07-04 LAB — PHOSPHORUS: Phosphorus: 3.3 mg/dL (ref 2.5–4.6)

## 2024-07-04 LAB — HEMOGLOBIN A1C
Hgb A1c MFr Bld: 10.6 % — ABNORMAL HIGH (ref 4.8–5.6)
Mean Plasma Glucose: 257.52 mg/dL

## 2024-07-04 LAB — CBG MONITORING, ED
Glucose-Capillary: 314 mg/dL — ABNORMAL HIGH (ref 70–99)
Glucose-Capillary: 347 mg/dL — ABNORMAL HIGH (ref 70–99)

## 2024-07-04 LAB — PRO BRAIN NATRIURETIC PEPTIDE: Pro Brain Natriuretic Peptide: 1527 pg/mL — ABNORMAL HIGH

## 2024-07-04 MED ORDER — INSULIN GLARGINE-YFGN 100 UNIT/ML ~~LOC~~ SOLN: 15.0000 [IU] | Freq: Every day | SUBCUTANEOUS | Status: DC

## 2024-07-04 MED ORDER — INSULIN GLARGINE 100 UNIT/ML ~~LOC~~ SOLN
15.0000 [IU] | Freq: Every day | SUBCUTANEOUS | Status: DC
  Administered 2024-07-04: 15 [IU] via SUBCUTANEOUS
  Filled 2024-07-04 (×2): qty 0.15

## 2024-07-04 MED ORDER — DILTIAZEM HCL-DEXTROSE 125-5 MG/125ML-% IV SOLN (PREMIX)
INTRAVENOUS | Status: AC
  Administered 2024-07-04: 5 mg/h via INTRAVENOUS
  Filled 2024-07-04: qty 125

## 2024-07-04 MED ORDER — LEVOTHYROXINE SODIUM 75 MCG PO TABS
150.0000 ug | ORAL_TABLET | ORAL | Status: DC
  Administered 2024-07-06 – 2024-07-10 (×3): 150 ug via ORAL
  Filled 2024-07-04 (×3): qty 2

## 2024-07-04 MED ORDER — INSULIN ASPART 100 UNIT/ML IJ SOLN
3.0000 [IU] | Freq: Three times a day (TID) | INTRAMUSCULAR | Status: DC
  Administered 2024-07-04 – 2024-07-05 (×3): 3 [IU] via SUBCUTANEOUS
  Filled 2024-07-04 (×3): qty 3

## 2024-07-04 MED ORDER — DILTIAZEM LOAD VIA INFUSION
10.0000 mg | Freq: Once | INTRAVENOUS | Status: AC
  Administered 2024-07-04: 10 mg via INTRAVENOUS
  Filled 2024-07-04: qty 10

## 2024-07-04 MED ORDER — LEVOTHYROXINE SODIUM 75 MCG PO TABS
175.0000 ug | ORAL_TABLET | ORAL | Status: DC
  Administered 2024-07-05 – 2024-07-09 (×3): 175 ug via ORAL
  Filled 2024-07-04 (×4): qty 1

## 2024-07-04 MED ORDER — ACETAZOLAMIDE SODIUM 500 MG IJ SOLR
500.0000 mg | Freq: Once | INTRAMUSCULAR | Status: AC
  Administered 2024-07-04: 500 mg via INTRAVENOUS
  Filled 2024-07-04: qty 500

## 2024-07-04 MED ORDER — METOPROLOL TARTRATE 25 MG PO TABS
25.0000 mg | ORAL_TABLET | Freq: Two times a day (BID) | ORAL | Status: DC
  Administered 2024-07-04 – 2024-07-05 (×2): 25 mg via ORAL
  Filled 2024-07-04 (×2): qty 1

## 2024-07-04 MED ORDER — MAGNESIUM SULFATE 2 GM/50ML IV SOLN
2.0000 g | Freq: Once | INTRAVENOUS | Status: AC
  Administered 2024-07-04: 2 g via INTRAVENOUS
  Filled 2024-07-04: qty 50

## 2024-07-04 MED ORDER — ENOXAPARIN SODIUM 40 MG/0.4ML IJ SOSY: 40.0000 mg | PREFILLED_SYRINGE | INTRAMUSCULAR | Status: DC

## 2024-07-04 MED ORDER — LEVOTHYROXINE SODIUM 175 MCG PO TABS
1750.0000 ug | ORAL_TABLET | ORAL | Status: DC
  Filled 2024-07-04: qty 10

## 2024-07-04 MED ORDER — PERFLUTREN LIPID MICROSPHERE
1.0000 mL | INTRAVENOUS | Status: AC | PRN
  Administered 2024-07-04: 3 mL via INTRAVENOUS

## 2024-07-04 MED ORDER — ENOXAPARIN SODIUM 100 MG/ML IJ SOSY
100.0000 mg | PREFILLED_SYRINGE | INTRAMUSCULAR | Status: DC
  Administered 2024-07-04: 100 mg via SUBCUTANEOUS
  Filled 2024-07-04: qty 1

## 2024-07-04 MED ORDER — LEVOTHYROXINE SODIUM 75 MCG PO TABS
150.0000 ug | ORAL_TABLET | ORAL | Status: DC
  Administered 2024-07-04: 150 ug via ORAL
  Filled 2024-07-04: qty 2

## 2024-07-04 MED ORDER — DILTIAZEM HCL-DEXTROSE 125-5 MG/125ML-% IV SOLN (PREMIX)
5.0000 mg/h | INTRAVENOUS | Status: DC
  Administered 2024-07-04: 7.5 mg/h via INTRAVENOUS
  Administered 2024-07-04: 10 mg/h via INTRAVENOUS

## 2024-07-04 MED ORDER — METOPROLOL TARTRATE 25 MG PO TABS
12.5000 mg | ORAL_TABLET | Freq: Two times a day (BID) | ORAL | Status: DC
  Administered 2024-07-04: 12.5 mg via ORAL
  Filled 2024-07-04: qty 1

## 2024-07-04 NOTE — ED Notes (Signed)
 After Dr. Beatris with Cardiology and they are aware of heart rhythm and pt symptoms, Dr. Charlton order to continue Cardizem  gtt and per Cardioplogy recommendation and Cards will come see the pt. Dr. Charlton is aware of pt hear rate is in the 140s.

## 2024-07-04 NOTE — Inpatient Diabetes Management (Addendum)
 Inpatient Diabetes Program Recommendations  AACE/ADA: New Consensus Statement on Inpatient Glycemic Control (2015)  Target Ranges:  Prepandial:   less than 140 mg/dL      Peak postprandial:   less than 180 mg/dL (1-2 hours)      Critically ill patients:  140 - 180 mg/dL   Lab Results  Component Value Date   GLUCAP 347 (H) 07/04/2024   HGBA1C 10.6 (H) 07/03/2024    Review of Glycemic Control  Latest Reference Range & Units 07/03/24 21:15 07/04/24 07:33 07/04/24 11:16  Glucose-Capillary 70 - 99 mg/dL 739 (H) 685 (H) 652 (H)   Diabetes history: DM 2 Outpatient Diabetes medications:  Invokana 300 mg daily, Metformin  1000 mg bid, Rybelsus 3 mg daily Current orders for Inpatient glycemic control:  Novolog  0-15 units tid with meals and HS Lantus  15 units daily Novolog  3 units tid with meals   Inpatient Diabetes Program Recommendations:    Note A1C=10.6% indicating poor control of DM.  Note that basal and bolus insulin  added today.  Based on A1C, patient will likely need insulin  at discharge.  Will see patient to discuss A1C.   Addendum 12:45- Spoke to patient at length regarding A1C of 10.6%.  told him that blood sugar are greater than goal.  He see's Dr[facility] and states he goes on Tuesdays/Wednesdays and he also has aides in the home daily.  We discussed that blood sugars need to be controlled better.  He may benefit from either a GLP-1 or insulin  to help with blood sugar management at home.  Called PCP- Mickel Carwin, NP 's office at [facility] to discuss. She states that she thinks patient would be a great candidate for GLP-1 such as Mounjaro and that she will start once patient is discharged.   They are able to assist with weekly medications for patient if GLP-1 is appropriate. If insulin  is indicated, will need to make sure that patient is able to self-administer using insulin  pen.  He does have aides that come in daily but they cannot administer injections. He has very  close follow-up with [facility] and they will continue to assist with DM management and control of CBG's.   Thanks,  Randall Bullocks, RN, BC-ADM Inpatient Diabetes Coordinator Pager [phone]  (8a-5p)

## 2024-07-04 NOTE — Progress Notes (Addendum)
" °  Overnight Coverage Note    Patient was seen for SVT.   He presented yesterday with chest pain and was found to be in acute on chronic HFpEF with elevated but flat troponins. He was seen by cardiology, admitted the hospitalist service, is being diuresed with IV Lasix , and has echocardiogram ordered for the AM.   He complained of recurrent chest pain this am and was noted to have HR in 160s. EKG shows possible a fib with HR 161. SBP is low 100s.   Vagal maneuvers were unsuccessful. He was given 10 mg IV diltiazem , 2 g IV magnesium , and was started on diltiazem  infusion. HR is down to 140 on IV diltiazem , patient has subjective improvement, and BP remains stable. On-call cardiology notified of the situation and agree with IV diltiazem  for now.  "

## 2024-07-04 NOTE — Plan of Care (Signed)

## 2024-07-04 NOTE — Progress Notes (Signed)
 " PROGRESS NOTE    Ronnie Hernandez  FMW:[id number] DOB: 10/21/1960 DOA: 07/03/2024 PCP: Pcp, No   chronically ill 64/M with diastolic CHF, morbid obesity, type 2 diabetes, rheumatoid arthritis, history of PE, gout, CKD 3 presented to the ED with chest pain and shortness of breath, intermittent episodes of chest pain since this weekend, he has chronic dyspnea and orthopnea. In the ED sodium 132, creatinine 1.1, proBNP 06/28/2000, troponin 105, CTA chest no acute findings Admitted, started on diuretics  Subjective: -Feels fair, chronic dyspnea on exertion, uses a power wheelchair for ambulation, has a home health aide  Assessment and Plan:  Acute on chronic diastolic CHF - Echo with EF greater than 75%, indeterminate diastolic parameters, limited by obesity - Continue Lasix  80 mg twice daily - Continue metoprolol  and losartan  - Likely poor candidate for SGLT2i - Cards following, - Increase activity, PT OT  Atypical chest pain - Etiology is unclear, has extensive risk factors for CAD - No ACS at this time, CTA chest negative for PE - Cards following, follow-up echo  SVT - Noted overnight, now on Cardizem  gtt. - Cards following, started on low-dose Lopressor  this morning  Uncontrolled type 2 diabetes mellitus with hyperglycemia - Metformin  on hold - CBGs elevated 2 90-3 1 4  range, A1c is 10.6 - Add Semglee , diabetes coordinator and dietitian consult  Morbid obesity Suspected OSA OHS -Has not been formally diagnosed with OSA, will need sleep study as outpatient - Monitor nocturnal sats  Hypothyroidism Continue Synthroid , TSH mildly elevated, will increase dose   DVT prophylaxis: Add Lovenox  Code Status: Full code Family Communication: None present Disposition Plan: Home versus short-term rehab  Consultants:    Procedures:   Antimicrobials:    Objective: Vitals:   07/04/24 1003 07/04/24 1020 07/04/24 1025 07/04/24 1038  BP: (!) 146/67     Pulse: 82     Resp:   14 (!) 21   Temp:    97.7 F (36.5 C)  TempSrc:    Axillary  SpO2:      Weight:      Height:        Intake/Output Summary (Last 24 hours) at 07/04/2024 1058 Last data filed at 07/04/2024 9362 Gross per 24 hour  Intake 50 ml  Output 900 ml  Net -850 ml   Filed Weights   07/03/24 1508  Weight: (!) 201.9 kg    Examination:  General exam: Morbidly obese chronically ill, AAO x 3 Respiratory system: Distant breath sounds Cardiovascular system: S1 & S2 heard, RRR.  Abd: nondistended, soft and nontender.Normal bowel sounds heard. Central nervous system: Alert and oriented. No focal neurological deficits. Extremities: Monic bilateral edema, weakness stasis changes, thick scaly skin with areas of erythema Skin: As above Psychiatry:  Mood & affect appropriate.     Data Reviewed:   CBC: Recent Labs  Lab 07/03/24 1530 07/04/24 0317  WBC 7.5 8.9  NEUTROABS  --  5.4  HGB 14.1 14.1  HCT 42.2 42.3  MCV 103.2* 100.7*  PLT 158 186   Basic Metabolic Panel: Recent Labs  Lab 07/03/24 1620 07/03/24 2232 07/04/24 0317  NA 132*  --  133*  K 5.0  --  4.1  CL 95*  --  95*  CO2 28  --  26  GLUCOSE 356*  --  298*  BUN 15  --  16  CREATININE 1.13  --  1.07  CALCIUM  9.1  --  9.0  MG  --  1.7 1.6*  PHOS  --   --  3.3   GFR: Estimated Creatinine Clearance: 117.4 mL/min (by C-G formula based on SCr of 1.07 mg/dL). Liver Function Tests: Recent Labs  Lab 07/04/24 0317  AST 56*  ALT 45*  ALKPHOS 125  BILITOT 0.6  PROT 7.2  ALBUMIN 3.2*   Recent Labs  Lab 07/04/24 0317  LIPASE 18   No results for input(s): AMMONIA in the last 168 hours. Coagulation Profile: No results for input(s): INR, PROTIME in the last 168 hours. Cardiac Enzymes: No results for input(s): CKTOTAL, CKMB, CKMBINDEX, TROPONINI in the last 168 hours. BNP (last 3 results) Recent Labs    05/03/24 1557 07/03/24 1620 07/04/24 0317  PROBNP 299.0 1,802.0* 1,527.0*   HbA1C: Recent Labs     07/03/24 2232  HGBA1C 10.6*   CBG: Recent Labs  Lab 07/03/24 2115 07/04/24 0733  GLUCAP 260* 314*   Lipid Profile: Recent Labs    07/03/24 2232  CHOL 188  HDL 41  LDLCALC 120*  TRIG 136  CHOLHDL 4.6   Thyroid Function Tests: Recent Labs    07/03/24 2232  TSH 8.130*  FREET4 0.96   Anemia Panel: No results for input(s): VITAMINB12, FOLATE, FERRITIN, TIBC, IRON, RETICCTPCT in the last 72 hours. Urine analysis:    Component Value Date/Time   COLORURINE YELLOW 12/19/2021 1252   APPEARANCEUR CLEAR 12/19/2021 1252   APPEARANCEUR Clear 12/05/2015 1226   LABSPEC 1.037 (H) 12/19/2021 1252   PHURINE 5.0 12/19/2021 1252   GLUCOSEU >=500 (A) 12/19/2021 1252   HGBUR NEGATIVE 12/19/2021 1252   BILIRUBINUR NEGATIVE 12/19/2021 1252   BILIRUBINUR Negative 12/05/2015 1226   KETONESUR NEGATIVE 12/19/2021 1252   PROTEINUR NEGATIVE 12/19/2021 1252   UROBILINOGEN 0.2 12/28/2013 1117   NITRITE NEGATIVE 12/19/2021 1252   LEUKOCYTESUR NEGATIVE 12/19/2021 1252   Sepsis Labs: @LABRCNTIP (procalcitonin:4,lacticidven:4)  )No results found for this or any previous visit (from the past 240 hours).   Radiology Studies: ECHOCARDIOGRAM COMPLETE Result Date: 07/04/2024    ECHOCARDIOGRAM REPORT   Patient Name:   Ronnie Hernandez Date of Exam: 07/04/2024 Medical Rec #:  [id number]         Height:       66.0 in Accession #:    [id number]        Weight:       445.0 lb Date of Birth:  1961-05-07        BSA:          2.809 m Patient Age:    64 years          BP:           91/53 mmHg Patient Gender: M                 HR:           138 bpm. Exam Location:  Inpatient Procedure: 2D Echo, Cardiac Doppler, Color Doppler and Intracardiac            Opacification Agent (Both Spectral and Color Flow Doppler were            utilized during procedure). Indications:    CHF - Acute Diastolic  History:        Patient has prior history of Echocardiogram examinations, most                 recent  12/21/2021. CHF, Signs/Symptoms:Shortness of Breath, Chest                 Pain and Edema; Risk Factors:Diabetes, Dyslipidemia,  Hypertension and Former Smoker.  Sonographer:    Juliene Rucks Referring Phys: [id number] EVA KATHEE PORE  Sonographer Comments: Technically difficult study due to poor echo windows, no subcostal window and patient is obese. Image acquisition challenging due to patient body habitus. IMPRESSIONS  1. Left ventricular ejection fraction, by estimation, is >75%. The left ventricle has hyperdynamic function. The left ventricle has no regional wall motion abnormalities. Left ventricular diastolic parameters are indeterminate.  2. Right ventricular systolic function is normal. The right ventricular size is normal.  3. The mitral valve is normal in structure. No evidence of mitral valve regurgitation. No evidence of mitral stenosis.  4. The aortic valve is tricuspid. Aortic valve regurgitation is not visualized. No aortic stenosis is present. FINDINGS  Left Ventricle: Left ventricular ejection fraction, by estimation, is >75%. The left ventricle has hyperdynamic function. The left ventricle has no regional wall motion abnormalities. Definity  contrast agent was given IV to delineate the left ventricular endocardial borders. The left ventricular internal cavity size was normal in size. There is no left ventricular hypertrophy. Left ventricular diastolic parameters are indeterminate. Right Ventricle: The right ventricular size is normal. Right ventricular systolic function is normal. Left Atrium: Left atrial size was normal in size. Right Atrium: Right atrial size was normal in size. Pericardium: There is no evidence of pericardial effusion. Mitral Valve: The mitral valve is normal in structure. No evidence of mitral valve regurgitation. No evidence of mitral valve stenosis. Tricuspid Valve: The tricuspid valve is not well visualized. Tricuspid valve regurgitation is trivial. No evidence of  tricuspid stenosis. Aortic Valve: The aortic valve is tricuspid. Aortic valve regurgitation is not visualized. No aortic stenosis is present. Pulmonic Valve: The pulmonic valve was not well visualized. Pulmonic valve regurgitation is not visualized. No evidence of pulmonic stenosis. Aorta: The aortic root is normal in size and structure. Venous: The inferior vena cava was not well visualized. IAS/Shunts: The interatrial septum was not well visualized.  LEFT VENTRICLE PLAX 2D LVIDd:         4.00 cm LVIDs:         3.40 cm LV PW:         1.10 cm LV IVS:        1.10 cm LVOT diam:     2.20 cm LVOT Area:     3.80 cm  LV Volumes (MOD) LV vol d, MOD A4C: 117.0 ml LV vol s, MOD A4C: 76.0 ml LV SV MOD A4C:     117.0 ml RIGHT VENTRICLE TAPSE (M-mode): 1.0 cm LEFT ATRIUM         Index LA diam:    2.90 cm 1.03 cm/m   AORTA Ao Root diam: 3.40 cm  SHUNTS Systemic Diam: 2.20 cm Redell Shallow MD Electronically signed by Redell Shallow MD Signature Date/Time: 07/04/2024/8:55:27 AM    Final    CT Angio Chest PE W and/or Wo Contrast Result Date: 07/03/2024 CLINICAL DATA:  Pulmonary embolism (PE) suspected, high prob, chest pain, short of breath EXAM: CT ANGIOGRAPHY CHEST WITH CONTRAST TECHNIQUE: Multidetector CT imaging of the chest was performed using the standard protocol during bolus administration of intravenous contrast. Multiplanar CT image reconstructions and MIPs were obtained to evaluate the vascular anatomy. RADIATION DOSE REDUCTION: This exam was performed according to the departmental dose-optimization program which includes automated exposure control, adjustment of the mA and/or kV according to patient size and/or use of iterative reconstruction technique. CONTRAST:  OMNIPAQUE  IOHEXOL  350 MG/ML SOLN COMPARISON:  03/03/2025, 05/03/2024 FINDINGS: Cardiovascular:  This is a technically adequate evaluation of the pulmonary vasculature. No filling defects or pulmonary emboli. The heart is unremarkable without  pericardial effusion. No evidence of thoracic aortic aneurysm or dissection. Atherosclerosis of the thoracic aorta. Mediastinum/Nodes: No enlarged mediastinal, hilar, or axillary lymph nodes. Thyroid gland, trachea, and esophagus demonstrate no significant findings. Lungs/Pleura: Minimal hypoventilatory changes at the lung bases. No airspace disease, effusion, or pneumothorax. Central airways are patent. Upper Abdomen: No acute abnormality. Musculoskeletal: No acute or destructive bony abnormalities. Reconstructed images demonstrate no additional findings. Review of the MIP images confirms the above findings. IMPRESSION: 1. No evidence of pulmonary embolus. 2. No acute intrathoracic process. 3.  Aortic Atherosclerosis (ICD10-I70.0). Electronically Signed   By: Ozell Daring M.D.   On: 07/03/2024 18:23   DG Chest Port 1 View Result Date: 07/03/2024 CLINICAL DATA:  Chest pain, short of breath EXAM: PORTABLE CHEST 1 VIEW COMPARISON:  05/03/2024 FINDINGS: The heart size and mediastinal contours are within normal limits. Both lungs are clear. The visualized skeletal structures are unremarkable. IMPRESSION: No active disease. Electronically Signed   By: Ozell Daring M.D.   On: 07/03/2024 15:45     Scheduled Meds:  aspirin  EC  81 mg Oral Daily   atorvastatin   80 mg Oral Daily   fenofibrate   160 mg Oral Daily   furosemide   80 mg Intravenous BID   insulin  aspart  0-15 Units Subcutaneous TID WC   insulin  aspart  0-5 Units Subcutaneous QHS   insulin  aspart  3 Units Subcutaneous TID WC   insulin  glargine  15 Units Subcutaneous Daily   levothyroxine   112 mcg Oral QAC breakfast   losartan   25 mg Oral Daily   metoprolol  tartrate  12.5 mg Oral BID   pantoprazole  (PROTONIX ) IV  40 mg Intravenous Q24H   Continuous Infusions:  diltiazem  (CARDIZEM ) infusion 7.5 mg/hr (07/04/24 1032)     LOS: 0 days    Time spent:    Sigurd Pac, MD [facility]   07/04/2024, 10:58 AM    "

## 2024-07-04 NOTE — ED Notes (Signed)
 ED Provider at bedside after being paged in regards to pt c/o CP/SOB and heart rate in the 160s

## 2024-07-04 NOTE — TOC Initial Note (Signed)
 Transition of Care [facility]) - Initial/Assessment Note    Patient Details  Name: Ronnie Hernandez MRN: [id number] Date of Birth: 1961/04/19  [facility] CM/SW Contact:    Debarah Saunas, RN Phone Number: 07/04/2024, 11:42 AM  Clinical Narrative:                 RNCM contacted [facility] for collateral information. Spoke with, [place], SW [phone] or [phone].  Francina states that pt participates in adult daycare program on Tuesday and Wednesday; pt has daily home health services and that when ready for discharge, pt may return home via [facility] or to call the office during regular hours.  Expected Discharge Plan: Home w Home Health Services Barriers to Discharge: Continued Medical Work up   Patient Goals and CMS Choice Patient states their goals for this hospitalization and ongoing recovery are:: Return home, continue receiving [facility] services          Expected Discharge Plan and Services In-house Referral: NA Discharge Planning Services: CM Consult Post Acute Care Choice: Home Health daily via [facility] Living arrangements for the past 2 months: Apartment                                      Prior Living Arrangements/Services Living arrangements for the past 2 months: Apartment Lives with:: Self Patient language and need for interpreter reviewed:: Yes        Need for Family Participation in Patient Care: No (Comment) Care giver support system in place?: Yes (comment) Current home services: DME, Homehealth aide (wheelchair (manual and electric), lift chair, hospital bed, tub bench, walker, cane) Criminal Activity/Legal Involvement Pertinent to Current Situation/Hospitalization: No - Comment as needed        Permission Sought/Granted Permission sought to share information with : Case Manager, Other (comment), Facility Medical Sales Representative             Permission granted to share info w Contact Information: [place], SW with  [facility] [phone] or [phone]  Emotional Assessment Appearance:: Appears stated age Attitude/Demeanor/Rapport: Engaged Affect (typically observed): Calm, Pleasant Orientation: : Oriented to Self, Oriented to Place, Oriented to  Time, Oriented to Situation Alcohol / Substance Use: Tobacco Use (former) Psych Involvement: No (comment)  Admission diagnosis:  Acute on chronic diastolic heart failure (HCC) [I50.33] Patient Active Problem List   Diagnosis Date Noted   Acute on chronic diastolic heart failure (HCC) 07/03/2024   SOB (shortness of breath) 07/03/2024   Atypical chest pain 07/03/2024   Elevated troponin 07/03/2024   Prolonged QT interval 07/03/2024   History of essential hypertension 07/03/2024   Acquired hypothyroidism 07/03/2024   Acquired hallux limitus of both feet 11/24/2022   CHF (congestive heart failure) (HCC) 12/20/2021   AKI (acute kidney injury) 12/19/2021   Chronic diastolic CHF (congestive heart failure) (HCC) 12/19/2021   Cellulitis 06/05/2020   Acute on chronic heart failure with preserved ejection fraction (HFpEF) (HCC)    Bilateral lower extremity edema 06/03/2020   Polyarthritis 08/17/2019   Physical deconditioning 08/16/2019   Pressure injury of skin 08/16/2019   Gout attack 08/15/2019   Moderate persistent asthma with exacerbation    Acute respiratory failure with hypoxia (HCC) 08/26/2018   Acute diastolic CHF (congestive heart failure) (HCC) 08/26/2018   Left knee pain 03/31/2018   Knee pain    Acute gout 04/18/2017   Pulmonary  embolus (HCC) 08/05/2016   Hyperglycemia, drug-induced 07/08/2016   Impaired glucose tolerance 07/08/2016   Leukocytosis 07/06/2016   Unable to walk 07/06/2016   Acute gouty arthritis 07/06/2016   Abnormality of gait 02/18/2016   Cellulitis of left lower extremity 01/27/2016   DM2 (diabetes mellitus, type 2) (HCC) 12/25/2013   Drug-induced gout 12/25/2013   HLD (hyperlipidemia) 08/01/2013   Morbid  obesity (HCC)    Hypertension    Gout    Colon cancer screening 07/27/2011   Benign neoplasm of colon 07/27/2011   PCP:  Gaither Gear, NP-C Pharmacy:   [facility] [phone] - [place], [place] - [address] AT [facility] [address] [facility] [address] [place] [address] Phone: [phone] Fax: [phone]     Social Drivers of Health (SDOH) Social History: SDOH Screenings   Tobacco Use: Medium Risk (07/03/2024)   SDOH Interventions:

## 2024-07-04 NOTE — Progress Notes (Signed)
 "  Progress Note  Patient Name: Ronnie Hernandez Date of Encounter: 07/04/2024  Primary Cardiologist: None   Subjective   Patient had recurrent chest pain and hot flashes in the ED and an EKG and telemetry showed that he had converted to atrial fibrillation with RVR overnight and started on a diltiazem  drip.  He has since converted back to normal sinus rhythm.  Says these are the same symptoms he has been having intermittently.  Otherwise feeling a bit better today.  Niece at bedside.  Inpatient Medications    Scheduled Meds:  aspirin  EC  81 mg Oral Daily   atorvastatin   80 mg Oral Daily   fenofibrate   160 mg Oral Daily   furosemide   80 mg Intravenous BID   insulin  aspart  0-15 Units Subcutaneous TID WC   insulin  aspart  0-5 Units Subcutaneous QHS   levothyroxine   112 mcg Oral QAC breakfast   losartan   25 mg Oral Daily   pantoprazole  (PROTONIX ) IV  40 mg Intravenous Q24H   Continuous Infusions:  diltiazem  (CARDIZEM ) infusion 10 mg/hr (07/04/24 0637)   PRN Meds: acetaminophen  **OR** acetaminophen , alum & mag hydroxide-simeth, fentaNYL  (SUBLIMAZE ) injection, LORazepam , melatonin   Vital Signs    Vitals:   07/04/24 0545 07/04/24 0600 07/04/24 0610 07/04/24 0623  BP:  119/76 121/70   Pulse:   (!) 150   Resp:  12 15   Temp:    98.1 F (36.7 C)  TempSrc:    Oral  SpO2: 97%  98%   Weight:      Height:        Intake/Output Summary (Last 24 hours) at 07/04/2024 0705 Last data filed at 07/04/2024 9362 Gross per 24 hour  Intake 50 ml  Output 900 ml  Net -850 ml   Filed Weights   07/03/24 1508  Weight: (!) 201.9 kg    Telemetry    Atrial fibrillation with RVR, spontaneous conversion to normal sinus rhythm at roughly 15 a.m.- Personally Reviewed  ECG    Atrial fibrillation with RVR- Personally Reviewed  Physical Exam   Physical Exam Vitals and nursing note reviewed.  Constitutional:      Appearance: Normal appearance. He is obese.  HENT:     Head:  Normocephalic and atraumatic.  Eyes:     Conjunctiva/sclera: Conjunctivae normal.  Cardiovascular:     Rate and Rhythm: Normal rate and regular rhythm.     Comments: Chronic skin changes of bilateral lower extremities with pitting edema of posterior thighs Pulmonary:     Effort: Pulmonary effort is normal.     Breath sounds: Normal breath sounds.  Skin:    Coloration: Skin is not pale.  Neurological:     Mental Status: He is alert.      Labs    Chemistry Recent Labs  Lab 07/03/24 1620 07/04/24 0317  NA 132* 133*  K 5.0 4.1  CL 95* 95*  CO2 28 26  GLUCOSE 356* 298*  BUN 15 16  CREATININE 1.13 1.07  CALCIUM  9.1 9.0  PROT  --  7.2  ALBUMIN  --  3.2*  AST  --  56*  ALT  --  45*  ALKPHOS  --  125  BILITOT  --  0.6  GFRNONAA >60 >60  ANIONGAP 10 11     Hematology Recent Labs  Lab 07/03/24 1530 07/04/24 0317  WBC 7.5 8.9  RBC 4.09* 4.20*  HGB 14.1 14.1  HCT 42.2 42.3  MCV 103.2* 100.7*  MCH 34.5*  33.6  MCHC 33.4 33.3  RDW 12.8 12.8  PLT 158 186    Cardiac EnzymesNo results for input(s): TROPONINI in the last 168 hours. No results for input(s): TROPIPOC in the last 168 hours.   BNP Recent Labs  Lab 07/03/24 1620 07/04/24 0317  PROBNP 1,802.0* 1,527.0*     DDimer No results for input(s): DDIMER in the last 168 hours.   Radiology    CT Angio Chest PE W and/or Wo Contrast Result Date: 07/03/2024 CLINICAL DATA:  Pulmonary embolism (PE) suspected, high prob, chest pain, short of breath EXAM: CT ANGIOGRAPHY CHEST WITH CONTRAST TECHNIQUE: Multidetector CT imaging of the chest was performed using the standard protocol during bolus administration of intravenous contrast. Multiplanar CT image reconstructions and MIPs were obtained to evaluate the vascular anatomy. RADIATION DOSE REDUCTION: This exam was performed according to the departmental dose-optimization program which includes automated exposure control, adjustment of the mA and/or kV according to  patient size and/or use of iterative reconstruction technique. CONTRAST:  OMNIPAQUE  IOHEXOL  350 MG/ML SOLN COMPARISON:  03/03/2025, 05/03/2024 FINDINGS: Cardiovascular: This is a technically adequate evaluation of the pulmonary vasculature. No filling defects or pulmonary emboli. The heart is unremarkable without pericardial effusion. No evidence of thoracic aortic aneurysm or dissection. Atherosclerosis of the thoracic aorta. Mediastinum/Nodes: No enlarged mediastinal, hilar, or axillary lymph nodes. Thyroid gland, trachea, and esophagus demonstrate no significant findings. Lungs/Pleura: Minimal hypoventilatory changes at the lung bases. No airspace disease, effusion, or pneumothorax. Central airways are patent. Upper Abdomen: No acute abnormality. Musculoskeletal: No acute or destructive bony abnormalities. Reconstructed images demonstrate no additional findings. Review of the MIP images confirms the above findings. IMPRESSION: 1. No evidence of pulmonary embolus. 2. No acute intrathoracic process. 3.  Aortic Atherosclerosis (ICD10-I70.0). Electronically Signed   By: Ozell Daring M.D.   On: 07/03/2024 18:23   DG Chest Port 1 View Result Date: 07/03/2024 CLINICAL DATA:  Chest pain, short of breath EXAM: PORTABLE CHEST 1 VIEW COMPARISON:  05/03/2024 FINDINGS: The heart size and mediastinal contours are within normal limits. Both lungs are clear. The visualized skeletal structures are unremarkable. IMPRESSION: No active disease. Electronically Signed   By: Ozell Daring M.D.   On: 07/03/2024 15:45    Cardiac Studies   Echocardiogram 07/04/2024:    1. Left ventricular ejection fraction, by estimation, is >75%. The left  ventricle has hyperdynamic function. The left ventricle has no regional  wall motion abnormalities. Left ventricular diastolic parameters are  indeterminate.   2. Right ventricular systolic function is normal. The right ventricular  size is normal.   3. The mitral valve is  normal in structure. No evidence of mitral valve  regurgitation. No evidence of mitral stenosis.   4. The aortic valve is tricuspid. Aortic valve regurgitation is not  visualized. No aortic stenosis is present.   Patient Profile     This is a 64 year old male with past medical history of chronic diastolic heart failure, morbid obesity, hypertension, RA, type 2 diabetes, gout, CKD from cardiology was consulted for chest pain and concern for heart failure exacerbation.   Assessment & Plan   Acute on chronic diastolic heart failure with difficult volume assessment due to body habitus-likely multifactorial: Suspected undiagnosed OSA and has a high salt diet.  Continue with Lasix  80 mg IV BID and will give a dose of Diamox  today.  Echo shows hyperdynamic function but was performed while patient was in A-fib with RVR.  Currently encouraged salt restriction. NSTEMI, likely demand ischemia in  the setting of heart failure-troponin flat x 2.  Echocardiogram as above.  Can consider outpatient stress testing. New onset atrial fibrillation with RVR CHA2DS2-VASc: 3 (hypertension, HF, diabetes).  Symptoms of chest pain and hot flashes are likely due to paroxysmal atrial fibrillation with RVR.  Converted to normal sinus rhythm following Cardizem  drip.  Start Eliquis 5 mg twice daily and stop aspirin .  Started on metoprolol  tartrate 12.5 mg twice daily will increase to 25 mg twice daily. Strongly advised to decrease salt intake, lose weight and get a sleep study as he likely has undiagnosed sleep apnea. Suspected undiagnosed sleep apnea recommended outpatient sleep study Hypomagnesemia Hyponatremia-  likely from volume overload.  Improving with diuresis Morbid obesity Hyperlipidemia Type 2 diabetes with hyperglycemia CKD Prolonged QT avoid QT prolonging agents and maintain K greater than 4.0 and mag greater than 2        For questions or updates, please contact [facility] Please consult  [email or web address] for contact info under        Signed, Emeline Calender, DO 07/04/2024, 7:05 AM    "

## 2024-07-04 NOTE — ED Notes (Signed)
 Echo at bedside

## 2024-07-04 NOTE — Progress Notes (Signed)
 Echocardiogram 2D Echocardiogram has been performed.  Juliene JINNY Rucks 07/04/2024, 8:49 AM

## 2024-07-05 ENCOUNTER — Encounter (HOSPITAL_COMMUNITY): Payer: Self-pay | Admitting: Internal Medicine

## 2024-07-05 DIAGNOSIS — I5033 Acute on chronic diastolic (congestive) heart failure: Secondary | ICD-10-CM | POA: Diagnosis not present

## 2024-07-05 LAB — GLUCOSE, CAPILLARY
Glucose-Capillary: 295 mg/dL — ABNORMAL HIGH (ref 70–99)
Glucose-Capillary: 338 mg/dL — ABNORMAL HIGH (ref 70–99)
Glucose-Capillary: 254 mg/dL — ABNORMAL HIGH (ref 70–99)
Glucose-Capillary: 246 mg/dL — ABNORMAL HIGH (ref 70–99)
Glucose-Capillary: 319 mg/dL — ABNORMAL HIGH (ref 70–99)

## 2024-07-05 LAB — CBC
HCT: 40 % (ref 39.0–52.0)
HCT: 40.4 % (ref 39.0–52.0)
Hemoglobin: 13.5 g/dL (ref 13.0–17.0)
Hemoglobin: 13.8 g/dL (ref 13.0–17.0)
MCH: 33.6 pg (ref 26.0–34.0)
MCH: 33.7 pg (ref 26.0–34.0)
MCHC: 33.8 g/dL (ref 30.0–36.0)
MCHC: 34.2 g/dL (ref 30.0–36.0)
MCV: 99.5 fL (ref 80.0–100.0)
MCV: 98.8 fL (ref 80.0–100.0)
Platelets: 190 K/uL (ref 150–400)
Platelets: 197 K/uL (ref 150–400)
RBC: 4.02 MIL/uL — ABNORMAL LOW (ref 4.22–5.81)
RBC: 4.09 MIL/uL — ABNORMAL LOW (ref 4.22–5.81)
RDW: 12.7 % (ref 11.5–15.5)
RDW: 12.8 % (ref 11.5–15.5)
WBC: 9.9 K/uL (ref 4.0–10.5)
WBC: 8.4 K/uL (ref 4.0–10.5)
nRBC: 0 % (ref 0.0–0.2)
nRBC: 0 % (ref 0.0–0.2)

## 2024-07-05 LAB — COMPREHENSIVE METABOLIC PANEL WITH GFR
ALT: 39 U/L (ref 0–44)
AST: 47 U/L — ABNORMAL HIGH (ref 15–41)
Albumin: 3 g/dL — ABNORMAL LOW (ref 3.5–5.0)
Alkaline Phosphatase: 123 U/L (ref 38–126)
Anion gap: 11 (ref 5–15)
BUN: 23 mg/dL (ref 8–23)
CO2: 26 mmol/L (ref 22–32)
Calcium: 8.7 mg/dL — ABNORMAL LOW (ref 8.9–10.3)
Chloride: 94 mmol/L — ABNORMAL LOW (ref 98–111)
Creatinine, Ser: 1.59 mg/dL — ABNORMAL HIGH (ref 0.61–1.24)
GFR, Estimated: 48 mL/min — ABNORMAL LOW
Glucose, Bld: 303 mg/dL — ABNORMAL HIGH (ref 70–99)
Potassium: 4.2 mmol/L (ref 3.5–5.1)
Sodium: 131 mmol/L — ABNORMAL LOW (ref 135–145)
Total Bilirubin: 0.6 mg/dL (ref 0.0–1.2)
Total Protein: 6.9 g/dL (ref 6.5–8.1)

## 2024-07-05 LAB — CREATININE, SERUM
Creatinine, Ser: 1.6 mg/dL — ABNORMAL HIGH (ref 0.61–1.24)
GFR, Estimated: 48 mL/min — ABNORMAL LOW

## 2024-07-05 MED ORDER — ASPIRIN 81 MG PO TBEC
81.0000 mg | DELAYED_RELEASE_TABLET | Freq: Every day | ORAL | Status: DC
  Administered 2024-07-05 – 2024-07-10 (×6): 81 mg via ORAL
  Filled 2024-07-05 (×6): qty 1

## 2024-07-05 MED ORDER — METOPROLOL TARTRATE 50 MG PO TABS: 50.0000 mg | ORAL_TABLET | Freq: Two times a day (BID) | ORAL | Status: DC

## 2024-07-05 MED ORDER — METOPROLOL TARTRATE 5 MG/5ML IV SOLN
5.0000 mg | Freq: Once | INTRAVENOUS | Status: DC
  Filled 2024-07-05: qty 5

## 2024-07-05 MED ORDER — METOPROLOL TARTRATE 5 MG/5ML IV SOLN
5.0000 mg | INTRAVENOUS | Status: AC | PRN
  Administered 2024-07-05: 5 mg via INTRAVENOUS
  Filled 2024-07-05: qty 5

## 2024-07-05 MED ORDER — ENOXAPARIN SODIUM 40 MG/0.4ML IJ SOSY: 40.0000 mg | PREFILLED_SYRINGE | INTRAMUSCULAR | Status: DC

## 2024-07-05 MED ORDER — METOPROLOL TARTRATE 50 MG PO TABS
50.0000 mg | ORAL_TABLET | Freq: Two times a day (BID) | ORAL | Status: DC
  Administered 2024-07-05: 50 mg via ORAL
  Filled 2024-07-05 (×3): qty 1

## 2024-07-05 MED ORDER — ENOXAPARIN SODIUM 100 MG/ML IJ SOSY
100.0000 mg | PREFILLED_SYRINGE | INTRAMUSCULAR | Status: DC
  Administered 2024-07-05 – 2024-07-10 (×6): 100 mg via SUBCUTANEOUS
  Filled 2024-07-05 (×6): qty 1

## 2024-07-05 MED ORDER — INSULIN ASPART 100 UNIT/ML IJ SOLN
5.0000 [IU] | Freq: Three times a day (TID) | INTRAMUSCULAR | Status: DC
  Administered 2024-07-05 – 2024-07-06 (×3): 5 [IU] via SUBCUTANEOUS
  Filled 2024-07-05 (×3): qty 5

## 2024-07-05 MED ORDER — INSULIN GLARGINE 100 UNIT/ML ~~LOC~~ SOLN
25.0000 [IU] | Freq: Every day | SUBCUTANEOUS | Status: DC
  Administered 2024-07-05: 25 [IU] via SUBCUTANEOUS
  Filled 2024-07-05 (×2): qty 0.25

## 2024-07-05 MED ORDER — METHOCARBAMOL 500 MG PO TABS
500.0000 mg | ORAL_TABLET | Freq: Four times a day (QID) | ORAL | Status: DC | PRN
  Administered 2024-07-05 – 2024-07-06 (×2): 500 mg via ORAL
  Filled 2024-07-05 (×2): qty 1

## 2024-07-05 MED ORDER — METOPROLOL TARTRATE 5 MG/5ML IV SOLN
INTRAVENOUS | Status: AC
  Administered 2024-07-05: 5 mg via INTRAVENOUS
  Filled 2024-07-05: qty 5

## 2024-07-05 NOTE — Progress Notes (Signed)
 Orthopedic Tech Progress Note Patient Details:  Ronnie Hernandez August 16, 1960 [id number]  Ortho Devices Type of Ortho Device: Radio broadcast assistant Ortho Device/Splint Location: Bilateral Ortho Device/Splint Interventions: Ordered, Application, Adjustment   Post Interventions Patient Tolerated: Well  [name] [name] 07/05/2024, 10:46 AM

## 2024-07-05 NOTE — Progress Notes (Signed)
 Mobility Specialist Progress Note:    07/05/24 1550  Mobility  Activity Stood with assistance;Ambulated with assistance (Pt stood and transfered from standard bed to bari bed w/ MS, RN and NT)  Level of Assistance Dependent, patient does less than 25%  Assistive Device Other (Comment) (4 person assist)  Distance Ambulated (ft) 3 ft  Range of Motion/Exercises Active  Activity Response Tolerated poorly;RN notified  Mobility Referral No  Mobility visit 1 Mobility  Mobility Specialist Start Time (ACUTE ONLY) 1350  Mobility Specialist Stop Time (ACUTE ONLY) 1420  Mobility Specialist Time Calculation (min) (ACUTE ONLY) 30 min   Assistance requested from NT and RN to transfer pt to [place] bed. Pt c/o possible leg pain, weakness, and anxiety. Pt needed MaxA to transfer supine>sit and sit>stand. Pt able to take steps to other bed w/ 2 person handheld. Pt needed MaxA to get into bed. Left pt w/ RN and all needs met.   Venetia Keel Mobility Specialist Please Neurosurgeon or Rehab Office at [phone]

## 2024-07-05 NOTE — Evaluation (Signed)
 Occupational Therapy Evaluation Patient Details Name: Ronnie Hernandez MRN: [id number] DOB: 1960/10/20 Today's Date: 07/05/2024   History of Present Illness   Pt is a 64 y.o. M presenting to [facility] on 07/03/24 with SOB, admitted for acute on chronic diastolic HF. PMH is significant for T2DM, HF, HLD, and hypothyroidism.     Clinical Impressions Ronnie Hernandez was evaluated s/p the above admission list. He lives alone, uses [employer] for transportation, PWC for community mobility, ambulates with a RW in the home and has a HH aide in the evening to assist with ADLs at baseline. Upon evaluation the pt was limited by chronic weakness, bilat hand RA and difficulty moving at bed level. Overall he needs max A for rolling at bed level and is unable to transition to EOB or OOB with +1 assist. Due to the deficits listed below the pt also needs total A for LB ADLs and min-mod A for UB ADLs at bed level. Pt will benefit from continued acute OT services and skilled inpatient follow up therapy, <3 hours/day.       If plan is discharge home, recommend the following:   A lot of help with walking and/or transfers;A lot of help with bathing/dressing/bathroom;Assistance with feeding;Assistance with cooking/housework;Assist for transportation;Help with stairs or ramp for entrance     Functional Status Assessment   Patient has had a recent decline in their functional status and demonstrates the ability to make significant improvements in function in a reasonable and predictable amount of time.     Equipment Recommendations   None recommended by OT      Precautions/Restrictions   Precautions Precautions: Fall Recall of Precautions/Restrictions: Intact Restrictions Weight Bearing Restrictions Per Provider Order: No     Mobility Bed Mobility Overal bed mobility: Needs Assistance Bed Mobility: Rolling Rolling: Max assist         General bed mobility comments: pt with increased difficulty moving in bed  due to habitus and weakness. At baseline he sleeps in lift recliner and uses lift functions to stand from recliner    Transfers                   General transfer comment: not attempted on eval - pt may benefit from lift to recliner and transfers from recliner to simulate baseline function      Balance                                           ADL either performed or assessed with clinical judgement   ADL Overall ADL's : Needs assistance/impaired Eating/Feeding: Set up;Sitting   Grooming: Set up;Sitting   Upper Body Bathing: Moderate assistance   Lower Body Bathing: Maximal assistance   Upper Body Dressing : Minimal assistance;Sitting   Lower Body Dressing: Total assistance   Toilet Transfer: Total assistance (bed level on eval)   Toileting- Clothing Manipulation and Hygiene: Total assistance       Functional mobility during ADLs: Maximal assistance (bed mobility) General ADL Comments: evaluation limited to bed level. pt limited by weakness, chronic RA     Vision Baseline Vision/History: 0 No visual deficits Vision Assessment?: No apparent visual deficits     Perception Perception: Within Functional Limits       Praxis Praxis: WFL       Pertinent Vitals/Pain Pain Assessment Pain Assessment: No/denies pain     Extremity/Trunk Assessment Upper Extremity  Assessment Upper Extremity Assessment: RUE deficits/detail;LUE deficits/detail RUE Deficits / Details: digits 2-5 swan neck deformities, some active movement at MPs. no ROM at IPs. He is able to use a fork with a modified grasp adn does most things bimanually due to poor grip RUE Coordination: decreased gross motor;decreased fine motor LUE Deficits / Details: digits 2-5 swan neck deformities LUE Coordination: decreased gross motor;decreased fine motor   Lower Extremity Assessment Lower Extremity Assessment: Defer to PT evaluation   Cervical / Trunk Assessment Cervical / Trunk  Assessment: Kyphotic;Other exceptions Cervical / Trunk Exceptions: increased body habitus   Communication Communication Communication: No apparent difficulties   Cognition Arousal: Alert Behavior During Therapy: WFL for tasks assessed/performed Cognition: No apparent impairments                               Following commands: Intact       Cueing  General Comments   Cueing Techniques: Verbal cues;Gestural cues  VSS   Exercises     Shoulder Instructions      Home Living Family/patient expects to be discharged to:: Private residence Living Arrangements: Other relatives Available Help at Discharge: Family;Personal care attendant Type of Home: Apartment Home Access: Level entry     Home Layout: One level     Bathroom Shower/Tub: Sponge bathes at baseline   Bathroom Toilet: Standard     Home Equipment: Teacher, English As A Foreign Language (2 wheels);Wheelchair - power          Prior Functioning/Environment Prior Level of Function : Needs assist       Physical Assist : Mobility (physical);ADLs (physical) Mobility (physical): Transfers ADLs (physical): Grooming;Bathing;Dressing;Toileting;IADLs Mobility Comments: pt sleeps in lift chair and uses lift function to assist with standing. Pt then performs stand step transfer to [facility] and needs assistance for peri-care. Pt uses power chair for community level mobility. ADLs Comments: Pt has HH aid that assists with toileting, bathing, dressing, and cooking. uses [employer] for transportation. loves to visit friends, go out to eat, attend [employer]    OT Problem List: Decreased strength;Decreased range of motion;Decreased activity tolerance;Impaired balance (sitting and/or standing);Decreased safety awareness;Decreased knowledge of use of DME or AE;Decreased knowledge of precautions   OT Treatment/Interventions: Self-care/ADL training;Therapeutic exercise;DME and/or AE instruction;Therapeutic activities;Balance  training;Patient/family education      OT Goals(Current goals can be found in the care plan section)   Acute Rehab OT Goals Patient Stated Goal: to get better OT Goal Formulation: With patient Time For Goal Achievement: 07/19/24 Potential to Achieve Goals: Good ADL Goals Pt Will Perform Upper Body Dressing: with set-up;sitting Pt Will Perform Lower Body Dressing: with mod assist;sit to/from stand Pt Will Transfer to Toilet: with min assist;stand pivot transfer;bedside commode Additional ADL Goal #1: Pt will stand for at least 3 minutes with CGA as a precursor to OOB ADLs   OT Frequency:  Min 2X/week    Co-evaluation              AM-PAC OT 6 Clicks Daily Activity     Outcome Measure Help from another person eating meals?: A Little Help from another person taking care of personal grooming?: A Little Help from another person toileting, which includes using toliet, bedpan, or urinal?: Total Help from another person bathing (including washing, rinsing, drying)?: A Lot Help from another person to put on and taking off regular upper body clothing?: A Little Help from another person to put on and taking off regular lower body  clothing?: Total 6 Click Score: 13   End of Session Nurse Communication: Mobility status  Activity Tolerance: Patient tolerated treatment well Patient left: in bed;with call bell/phone within reach;with bed alarm set  OT Visit Diagnosis: Unsteadiness on feet (R26.81);Other abnormalities of gait and mobility (R26.89);Muscle weakness (generalized) (M62.81)                Time: 9067-9049 OT Time Calculation (min): 18 min Charges:  OT General Charges $OT Visit: 1 Visit OT Evaluation $OT Eval Moderate Complexity: 1 Mod  Lucie Kendall, OTR/L Acute Rehabilitation Services Office [phone] Secure Chat Communication Preferred   Lucie JONETTA Kendall 07/05/2024, 11:14 AM

## 2024-07-05 NOTE — Progress Notes (Signed)
 "  Progress Note  Patient Name: Ronnie Hernandez Date of Encounter: 07/05/2024  Primary Cardiologist: None   Subjective   Reports some muscle cramping last night improved with mustard and a banana. No other complaints and has not had any more of the hot flashes.   Inpatient Medications    Scheduled Meds:  atorvastatin   80 mg Oral Daily   enoxaparin  (LOVENOX ) injection  100 mg Subcutaneous Q24H   fenofibrate   160 mg Oral Daily   furosemide   80 mg Intravenous BID   insulin  aspart  0-15 Units Subcutaneous TID WC   insulin  aspart  0-5 Units Subcutaneous QHS   insulin  aspart  3 Units Subcutaneous TID WC   insulin  glargine  15 Units Subcutaneous Daily   [START ON 07/06/2024] levothyroxine   150 mcg Oral QODAY   And   levothyroxine   175 mcg Oral QODAY   losartan   25 mg Oral Daily   metoprolol  tartrate  25 mg Oral BID   pantoprazole  (PROTONIX ) IV  40 mg Intravenous Q24H   Continuous Infusions:  diltiazem  (CARDIZEM ) infusion Stopped (07/04/24 1150)   PRN Meds: acetaminophen  **OR** acetaminophen , alum & mag hydroxide-simeth, fentaNYL  (SUBLIMAZE ) injection, LORazepam , melatonin   Vital Signs    Vitals:   07/04/24 1639 07/04/24 1947 07/04/24 2343 07/05/24 0314  BP: 122/85 (!) 114/57 (!) 100/50 (!) 102/52  Pulse: 73 78 84 78  Resp: 20 18 20 20   Temp: 97.7 F (36.5 C) 98 F (36.7 C) 97.8 F (36.6 C) 98.4 F (36.9 C)  TempSrc: Oral Oral Oral Oral  SpO2: 95% 96% 94% 97%  Weight: (!) 199.6 kg   (!) 203.2 kg  Height: 5' 6 (1.676 m)       Intake/Output Summary (Last 24 hours) at 07/05/2024 0749 Last data filed at 07/04/2024 2345 Gross per 24 hour  Intake 51.17 ml  Output 1800 ml  Net -1748.83 ml   Filed Weights   07/03/24 1508 07/04/24 1639 07/05/24 0314  Weight: (!) 201.9 kg (!) 199.6 kg (!) 203.2 kg    Telemetry    Atrial fibrillation with RVR, spontaneous conversion to normal sinus rhythm at roughly 15 a.m.- Personally Reviewed  ECG    Atrial fibrillation with  RVR- Personally Reviewed  Physical Exam   Physical Exam Vitals and nursing note reviewed.  Constitutional:      Appearance: He is obese. He is not ill-appearing.  Cardiovascular:     Rate and Rhythm: Normal rate and regular rhythm.  Pulmonary:     Effort: Pulmonary effort is normal.  Musculoskeletal:     Comments: Chronic lymphedema  Neurological:     Mental Status: He is alert.      Labs    Chemistry Recent Labs  Lab 07/03/24 1620 07/04/24 0317 07/05/24 0307  NA 132* 133* 131*  K 5.0 4.1 4.2  CL 95* 95* 94*  CO2 28 26 26   GLUCOSE 356* 298* 303*  BUN 15 16 23   CREATININE 1.13 1.07 1.59*  CALCIUM  9.1 9.0 8.7*  PROT  --  7.2 6.9  ALBUMIN  --  3.2* 3.0*  AST  --  56* 47*  ALT  --  45* 39  ALKPHOS  --  125 123  BILITOT  --  0.6 0.6  GFRNONAA >60 >60 48*  ANIONGAP 10 11 11      Hematology Recent Labs  Lab 07/03/24 1530 07/04/24 0317 07/05/24 0307  WBC 7.5 8.9 9.9  RBC 4.09* 4.20* 4.02*  HGB 14.1 14.1 13.5  HCT 42.2  42.3 40.0  MCV 103.2* 100.7* 99.5  MCH 34.5* 33.6 33.6  MCHC 33.4 33.3 33.8  RDW 12.8 12.8 12.7  PLT 158 186 190    Cardiac EnzymesNo results for input(s): TROPONINI in the last 168 hours. No results for input(s): TROPIPOC in the last 168 hours.   BNP Recent Labs  Lab 07/03/24 1620 07/04/24 0317  PROBNP 1,802.0* 1,527.0*     DDimer No results for input(s): DDIMER in the last 168 hours.   Radiology    ECHOCARDIOGRAM COMPLETE Result Date: 07/04/2024    ECHOCARDIOGRAM REPORT   Patient Name:   Ronnie Hernandez Date of Exam: 07/04/2024 Medical Rec #:  [id number]         Height:       66.0 in Accession #:    [id number]        Weight:       445.0 lb Date of Birth:  1961-05-20        BSA:          2.809 m Patient Age:    63 years          BP:           91/53 mmHg Patient Gender: M                 HR:           138 bpm. Exam Location:  Inpatient Procedure: 2D Echo, Cardiac Doppler, Color Doppler and Intracardiac            Opacification  Agent (Both Spectral and Color Flow Doppler were            utilized during procedure). Indications:    CHF - Acute Diastolic  History:        Patient has prior history of Echocardiogram examinations, most                 recent 12/21/2021. CHF, Signs/Symptoms:Shortness of Breath, Chest                 Pain and Edema; Risk Factors:Diabetes, Dyslipidemia,                 Hypertension and Former Smoker.  Sonographer:    Juliene Rucks Referring Phys: [id number] EVA KATHEE PORE  Sonographer Comments: Technically difficult study due to poor echo windows, no subcostal window and patient is obese. Image acquisition challenging due to patient body habitus. IMPRESSIONS  1. Left ventricular ejection fraction, by estimation, is >75%. The left ventricle has hyperdynamic function. The left ventricle has no regional wall motion abnormalities. Left ventricular diastolic parameters are indeterminate.  2. Right ventricular systolic function is normal. The right ventricular size is normal.  3. The mitral valve is normal in structure. No evidence of mitral valve regurgitation. No evidence of mitral stenosis.  4. The aortic valve is tricuspid. Aortic valve regurgitation is not visualized. No aortic stenosis is present. FINDINGS  Left Ventricle: Left ventricular ejection fraction, by estimation, is >75%. The left ventricle has hyperdynamic function. The left ventricle has no regional wall motion abnormalities. Definity  contrast agent was given IV to delineate the left ventricular endocardial borders. The left ventricular internal cavity size was normal in size. There is no left ventricular hypertrophy. Left ventricular diastolic parameters are indeterminate. Right Ventricle: The right ventricular size is normal. Right ventricular systolic function is normal. Left Atrium: Left atrial size was normal in size. Right Atrium: Right atrial size was normal in size. Pericardium: There is no evidence of  pericardial effusion. Mitral Valve: The mitral  valve is normal in structure. No evidence of mitral valve regurgitation. No evidence of mitral valve stenosis. Tricuspid Valve: The tricuspid valve is not well visualized. Tricuspid valve regurgitation is trivial. No evidence of tricuspid stenosis. Aortic Valve: The aortic valve is tricuspid. Aortic valve regurgitation is not visualized. No aortic stenosis is present. Pulmonic Valve: The pulmonic valve was not well visualized. Pulmonic valve regurgitation is not visualized. No evidence of pulmonic stenosis. Aorta: The aortic root is normal in size and structure. Venous: The inferior vena cava was not well visualized. IAS/Shunts: The interatrial septum was not well visualized.  LEFT VENTRICLE PLAX 2D LVIDd:         4.00 cm LVIDs:         3.40 cm LV PW:         1.10 cm LV IVS:        1.10 cm LVOT diam:     2.20 cm LVOT Area:     3.80 cm  LV Volumes (MOD) LV vol d, MOD A4C: 117.0 ml LV vol s, MOD A4C: 76.0 ml LV SV MOD A4C:     117.0 ml RIGHT VENTRICLE TAPSE (M-mode): 1.0 cm LEFT ATRIUM         Index LA diam:    2.90 cm 1.03 cm/m   AORTA Ao Root diam: 3.40 cm  SHUNTS Systemic Diam: 2.20 cm Redell Shallow MD Electronically signed by Redell Shallow MD Signature Date/Time: 07/04/2024/8:55:27 AM    Final    CT Angio Chest PE W and/or Wo Contrast Result Date: 07/03/2024 CLINICAL DATA:  Pulmonary embolism (PE) suspected, high prob, chest pain, short of breath EXAM: CT ANGIOGRAPHY CHEST WITH CONTRAST TECHNIQUE: Multidetector CT imaging of the chest was performed using the standard protocol during bolus administration of intravenous contrast. Multiplanar CT image reconstructions and MIPs were obtained to evaluate the vascular anatomy. RADIATION DOSE REDUCTION: This exam was performed according to the departmental dose-optimization program which includes automated exposure control, adjustment of the mA and/or kV according to patient size and/or use of iterative reconstruction technique. CONTRAST:  OMNIPAQUE  IOHEXOL   350 MG/ML SOLN COMPARISON:  03/03/2025, 05/03/2024 FINDINGS: Cardiovascular: This is a technically adequate evaluation of the pulmonary vasculature. No filling defects or pulmonary emboli. The heart is unremarkable without pericardial effusion. No evidence of thoracic aortic aneurysm or dissection. Atherosclerosis of the thoracic aorta. Mediastinum/Nodes: No enlarged mediastinal, hilar, or axillary lymph nodes. Thyroid gland, trachea, and esophagus demonstrate no significant findings. Lungs/Pleura: Minimal hypoventilatory changes at the lung bases. No airspace disease, effusion, or pneumothorax. Central airways are patent. Upper Abdomen: No acute abnormality. Musculoskeletal: No acute or destructive bony abnormalities. Reconstructed images demonstrate no additional findings. Review of the MIP images confirms the above findings. IMPRESSION: 1. No evidence of pulmonary embolus. 2. No acute intrathoracic process. 3.  Aortic Atherosclerosis (ICD10-I70.0). Electronically Signed   By: Ozell Daring M.D.   On: 07/03/2024 18:23   DG Chest Port 1 View Result Date: 07/03/2024 CLINICAL DATA:  Chest pain, short of breath EXAM: PORTABLE CHEST 1 VIEW COMPARISON:  05/03/2024 FINDINGS: The heart size and mediastinal contours are within normal limits. Both lungs are clear. The visualized skeletal structures are unremarkable. IMPRESSION: No active disease. Electronically Signed   By: Ozell Daring M.D.   On: 07/03/2024 15:45    Cardiac Studies   Echocardiogram 07/04/2024:    1. Left ventricular ejection fraction, by estimation, is >75%. The left  ventricle has hyperdynamic function. The left ventricle  has no regional  wall motion abnormalities. Left ventricular diastolic parameters are  indeterminate.   2. Right ventricular systolic function is normal. The right ventricular  size is normal.   3. The mitral valve is normal in structure. No evidence of mitral valve  regurgitation. No evidence of mitral stenosis.    4. The aortic valve is tricuspid. Aortic valve regurgitation is not  visualized. No aortic stenosis is present.   Patient Profile     This is a 64 year old male with past medical history of chronic diastolic heart failure, morbid obesity, hypertension, RA, type 2 diabetes, gout, CKD from cardiology was consulted for chest pain and concern for heart failure exacerbation.   Assessment & Plan   Acute on chronic diastolic heart failure with difficult volume assessment due to body habitus-likely multifactorial: Suspected undiagnosed OSA and has a high salt diet.  Received Lasix  80 mg IV BID and a dose of Diamox .  Weight and I/os inaccurate.  Echo shows hyperdynamic function but was performed while patient was in SVT. Appears euvolemic today but volume is difficult to assess.  Encouraged salt restriction. May consider Cardiomems as an outpatient due to difficult volume assessment. NSTEMI, likely demand ischemia in the setting of heart failure-troponin flat x 2.  Echocardiogram as above.  Can consider outpatient stress testing. Restart aspirin . Paroxysmal SVT  EKG labeled AF but looks more like SVT from the ED and no fibrillation waves on telemetry during conversion to NSR. Symptoms of chest pain and hot flashes are likely due to paroxysmal SVT.  Converted to normal sinus rhythm following Cardizem  drip.  Change lovenox  to DVT prophylaxis dose.  Continue metoprolol  to tartrate 25 mg twice daily. Strongly advised to decrease salt intake, lose weight and get a sleep study as he likely has undiagnosed sleep apnea. AKI on CKD hold losartan  and Lasix  today Suspected undiagnosed sleep apnea recommended outpatient sleep study Hypomagnesemia Hyponatremia-  likely from volume overload.  Improving with diuresis Morbid obesity Hyperlipidemia Type 2 diabetes with hyperglycemia Prolonged QT avoid QT prolonging agents and maintain K greater than 4.0 and mag greater than 2        For questions or updates, please  contact [facility] Please consult [email or web address] for contact info under        Signed, Emeline Calender, DO 07/05/2024, 7:49 AM    "

## 2024-07-05 NOTE — Progress Notes (Addendum)
 C/o chest pain 5/10 aching and discomfort. EKG just completed at 1626. This has been an ongoing issue for him today. Notified Dr. Franky .   2035- EKG submitted and notified Dr Franky See new order for IV metoprolol  and VO from Dr Franky to give the PO metoprolol  now along with the IV per order. See MAR  2057- BP is 77/66 map 72. Not giving metoprolol  per order right now and notified DR. Franky. HR 79 NSR now.

## 2024-07-05 NOTE — Progress Notes (Incomplete)
 Heart Failure Nurse Navigator Progress Note  PCP: Gaither Gear, NP-C PCP-Cardiologist: *** Admission Diagnosis: *** Admitted from: ***  Presentation:   Ronnie Hernandez presented with ***  ECHO/ LVEF: ***  Clinical Course:  Past Medical History:  Diagnosis Date   Acquired hypothyroidism    Arthritis    knees, hands (04/19/2017)   Cellulitis 05/2020   bilateral legs   Childhood asthma    Chronic diastolic heart failure (HCC)    grade 3 diastolic dysfunction on echo from 08/06/16   Diabetes type 2, uncontrolled    Gout    have had it in both feet, both hands, right shoulder, back (04/19/2017)   Hyperlipidemia    Hypertension    Morbidly obese (HCC)    Pulmonary embolism (HCC) 2017?     Social History   Socioeconomic History   Marital status: Single    Spouse name: Not on file   Number of children: Not on file   Years of education: Not on file   Highest education level: Not on file  Occupational History   Not on file  Tobacco Use   Smoking status: Former    Current packs/day: 0.00    Types: Cigarettes    Quit date: 02/09/1982    Years since quitting: 42.4   Smokeless tobacco: Never  Vaping Use   Vaping status: Never Used  Substance and Sexual Activity   Alcohol use: No   Drug use: Not Currently    Comment: 04/19/2017 nothing since I was 21   Sexual activity: Never  Other Topics Concern   Not on file  Social History Narrative   Not on file   Social Drivers of Health   Tobacco Use: Medium Risk (07/03/2024)   Patient History    Smoking Tobacco Use: Former    Smokeless Tobacco Use: Never    Passive Exposure: Not on Actuary Strain: Not on file  Food Insecurity: No Food Insecurity (07/04/2024)   Epic    Worried About Programme Researcher, Broadcasting/film/video in the Last Year: Never true    Ran Out of Food in the Last Year: Never true  Transportation Needs: No Transportation Needs (07/04/2024)   Epic    Lack of Transportation (Medical): No    Lack of  Transportation (Non-Medical): No  Physical Activity: Not on file  Stress: Not on file  Social Connections: Unknown (07/04/2024)   Social Connection and Isolation Panel    Frequency of Communication with Friends and Family: Not on file    Frequency of Social Gatherings with Friends and Family: Not on file    Attends Religious Services: Not on file    Active Member of Clubs or Organizations: Not on file    Attends Banker Meetings: Not on file    Marital Status: Never married  Depression (PHQ2-9): Not on file  Alcohol Screen: Not on file  Housing: Low Risk (07/04/2024)   Epic    Unable to Pay for Housing in the Last Year: No    Number of Times Moved in the Last Year: 0    Homeless in the Last Year: No  Utilities: Not At Risk (07/04/2024)   Epic    Threatened with loss of utilities: No  Health Literacy: Not on file   Education Assessment and Provision:  Detailed education and instructions provided on heart failure disease management including the following:  Signs and symptoms of Heart Failure When to call the physician Importance of daily weights Low sodium diet  Fluid restriction Medication management Anticipated future follow-up appointments  Patient education given on each of the above topics.  Patient acknowledges understanding via teach back method and acceptance of all instructions.  Education Materials:  Living Better With Heart Failure Booklet, HF zone tool, & Daily Weight Tracker Tool.  Patient has scale at home: *** Patient has pill box at home: ***    High Risk Criteria for Readmission and/or Poor Patient Outcomes: Heart failure hospital admissions (last 6 months): ***  No Show rate: *** Difficult social situation: *** Demonstrates medication adherence: *** Primary Language: *** Literacy level: ***  Barriers of Care:   ***  Considerations/Referrals:   Referral made to Heart Failure Pharmacist Stewardship: *** Referral made to Heart Failure  CSW/NCM TOC: *** Referral made to Heart & Vascular TOC clinic: ***  Items for Follow-up on DC/TOC: ***   ***

## 2024-07-05 NOTE — TOC Progression Note (Signed)
 Transition of Care [facility]) - Progression Note    Patient Details  Name: Ronnie Hernandez MRN: [id number] Date of Birth: April 29, 1961  [facility] CM/SW Contact  Gwenn Julien Norris, [place] Phone Number: 07/05/2024, 3:19 PM  Clinical Narrative: PT rec for SNF noted. Pt's weight is a barrier to SNF placement. Per CM note, pt attends [facility] day program and has daily HH. Anticipate return home at dc.   Julien Gwenn, MSW, LCSW [phone] (coverage)      Expected Discharge Plan: Home w Home Health Services Barriers to Discharge: Continued Medical Work up               Expected Discharge Plan and Services In-house Referral: NA Discharge Planning Services: CM Consult Post Acute Care Choice: Home Health Living arrangements for the past 2 months: Apartment                                       Social Drivers of Health (SDOH) Interventions SDOH Screenings   Food Insecurity: No Food Insecurity (07/04/2024)  Housing: Unknown (07/05/2024)  Transportation Needs: No Transportation Needs (07/04/2024)  Utilities: Not At Risk (07/04/2024)  Alcohol Screen: Low Risk (07/05/2024)  Financial Resource Strain: Low Risk (07/05/2024)  Social Connections: Unknown (07/04/2024)  Tobacco Use: Medium Risk (07/03/2024)    Readmission Risk Interventions     No data to display

## 2024-07-05 NOTE — Inpatient Diabetes Management (Addendum)
 Inpatient Diabetes Program Recommendations  AACE/ADA: New Consensus Statement on Inpatient Glycemic Control (2025)  Target Ranges:  Prepandial:   less than 140 mg/dL      Peak postprandial:   less than 180 mg/dL (1-2 hours)      Critically ill patients:  140 - 180 mg/dL   Lab Results  Component Value Date   GLUCAP 254 (H) 07/05/2024   HGBA1C 10.6 (H) 07/03/2024    Review of Glycemic Control  Latest Reference Range & Units 07/04/24 21:20 07/05/24 05:36 07/05/24 06:44 07/05/24 11:43  Glucose-Capillary 70 - 99 mg/dL 702 (H) 704 (H) 661 (H) 254 (H)  Diabetes history: DM 2 Outpatient Diabetes medications:  Invokana 300 mg daily, Metformin  1000 mg bid, Rybelsus 3 mg daily Current orders for Inpatient glycemic control:  Novolog  0-15 units tid with meals and HS Lantus  25 units daily Novolog  5 units tid with meals  Inpatient Diabetes Program Recommendations:    Attempted to see patient today and he was sleeping soundly.  CBG's continue to be >goal.  I spoke with [facility] yesterday and they plan to start  GLP- (Mounjaro) when patient leaves the hospital.  [facility] and they can administer GLP- there.  They do not feel patient is capable of self-administering insulin  at home due to contracted hands and aides cannot give injections.  Will continue to follow.  When patient is discharged will need close collaboration with [facility] provider and resources.    Thanks,  Randall Bullocks, RN, BC-ADM Inpatient Diabetes Coordinator Pager [phone] (8a-5p)

## 2024-07-05 NOTE — Evaluation (Signed)
 Physical Therapy Evaluation Patient Details Name: Ronnie Hernandez MRN: [id number] DOB: 23-Nov-1960 Today's Date: 07/05/2024  History of Present Illness  Pt is a 64 y.o. M presenting to [facility] on 07/03/24 with SOB, admitted for acute on chronic diastolic HF. PMH is significant for T2DM, HF, HLD, and hypothyroidism.  Clinical Impression  Prior to admittance, pt was mobilizing with RW for household level distances and then transfers to power chair for community level distances. Pt has HH aid that assists with bathing, dressing, toileting and iADLs. Pt presents to evaluation with deficits in mobility, strength, power, activity tolerance, balance, and pain. Pt was able to complete bed mobility with max A of 1 to max A of 3 depending on the mobility task, and completed STS from elevated EOB with AD and no physical assistance. Further mobility limited secondary to pain and weakness. Pt with notable RA in bilateral hands, R>L. PT will continue to treat pt while he is admitted. Patient will benefit from continued inpatient follow up therapy, <3 hours/day         If plan is discharge home, recommend the following: A lot of help with bathing/dressing/bathroom;Assist for transportation;Two people to help with walking and/or transfers   Can travel by private vehicle   No    Equipment Recommendations Deitra lift  Recommendations for Other Services       Functional Status Assessment Patient has had a recent decline in their functional status and demonstrates the ability to make significant improvements in function in a reasonable and predictable amount of time.     Precautions / Restrictions Precautions Precautions: Fall Recall of Precautions/Restrictions: Intact Restrictions Weight Bearing Restrictions Per Provider Order: No      Mobility  Bed Mobility Overal bed mobility: Needs Assistance Bed Mobility: Supine to Sit, Sit to Supine, Rolling Rolling: Max assist, +2 for physical assistance, Used  rails   Supine to sit: HOB elevated, Used rails, Max assist Sit to supine: Mod assist   General bed mobility comments: Pt completed supine to sit with HOB elevated requiring physical assistance for advancing BLEs towards EOB and then pt pulls to sit via therapist's hands. Pt at end of session requires physical assistance for BLE management for sit to supine and then max A +3 for rolling and getting situated towards HOB. VC for sequencing; increased time to complete.    Transfers Overall transfer level: Needs assistance Equipment used: Rolling walker (2 wheels) (bari) Transfers: Sit to/from Stand Sit to Stand: Contact guard assist, From elevated surface           General transfer comment: Pt completed STS from EOB with RW from elevated surface and cues for sequencing. Pt uses momentum to assist with power up to stand and then supports self with bilateral elbows on walker. Unable to progress secondary to weakness.    Ambulation/Gait                  Stairs            Wheelchair Mobility     Tilt Bed    Modified Rankin (Stroke Patients Only)       Balance Overall balance assessment: Needs assistance Sitting-balance support: Feet supported, No upper extremity supported Sitting balance-Leahy Scale: Good Sitting balance - Comments: seated EOB without BUE support   Standing balance support: Bilateral upper extremity supported, During functional activity, Reliant on assistive device for balance Standing balance-Leahy Scale: Poor Standing balance comment: reliant on external support for balance  Pertinent Vitals/Pain Pain Assessment Pain Assessment: No/denies pain    Home Living Family/patient expects to be discharged to:: Private residence Living Arrangements: Other relatives Available Help at Discharge: Family;Personal care attendant (pt has a personal care attendant that comes every afternoon to assist with bathing,  dressing, and cooking.) Type of Home: Apartment Home Access: Level entry       Home Layout: One level Home Equipment: BSC/3in1;Rolling Walker (2 wheels);Wheelchair - power      Prior Function Prior Level of Function : Needs assist       Physical Assist : Mobility (physical);ADLs (physical) Mobility (physical): Transfers (uses lift chair for transfers) ADLs (physical): Grooming;Bathing;Dressing;Toileting;IADLs Mobility Comments: pt sleeps in lift chair and uses lift function to assist with standing. Pt then performs stand step transfer to [facility] and needs assistance for peri-care. Pt uses power chair for community level mobility. ADLs Comments: Pt has HH aid that assists with toileting, bathing, dressing, and cooking.     Extremity/Trunk Assessment   Upper Extremity Assessment Upper Extremity Assessment: Defer to OT evaluation    Lower Extremity Assessment Lower Extremity Assessment: Generalized weakness    Cervical / Trunk Assessment Cervical / Trunk Assessment: Kyphotic  Communication   Communication Communication: No apparent difficulties    Cognition Arousal: Alert Behavior During Therapy: WFL for tasks assessed/performed   PT - Cognitive impairments: No apparent impairments                         Following commands: Intact       Cueing Cueing Techniques: Verbal cues, Gestural cues     General Comments General comments (skin integrity, edema, etc.): BP supine: 97/54    Exercises     Assessment/Plan    PT Assessment Patient needs continued PT services  PT Problem List Decreased strength;Decreased activity tolerance;Decreased balance;Decreased mobility       PT Treatment Interventions DME instruction;Gait training;Functional mobility training;Therapeutic activities;Therapeutic exercise;Balance training;Patient/family education;Wheelchair mobility training;Manual techniques;Modalities    PT Goals (Current goals can be found in the Care Plan  section)  Acute Rehab PT Goals Patient Stated Goal: to get stronger PT Goal Formulation: With patient Time For Goal Achievement: 07/19/24 Potential to Achieve Goals: Good    Frequency Min 2X/week     Co-evaluation               AM-PAC PT 6 Clicks Mobility  Outcome Measure Help needed turning from your back to your side while in a flat bed without using bedrails?: Total Help needed moving from lying on your back to sitting on the side of a flat bed without using bedrails?: Total Help needed moving to and from a bed to a chair (including a wheelchair)?: Total Help needed standing up from a chair using your arms (e.g., wheelchair or bedside chair)?: A Little Help needed to walk in hospital room?: Total Help needed climbing 3-5 steps with a railing? : Total 6 Click Score: 8    End of Session   Activity Tolerance: Patient tolerated treatment well Patient left: in bed;with call bell/phone within reach;with bed alarm set Nurse Communication: Mobility status PT Visit Diagnosis: Unsteadiness on feet (R26.81);Other abnormalities of gait and mobility (R26.89);Muscle weakness (generalized) (M62.81)    Time: 9185-9149 PT Time Calculation (min) (ACUTE ONLY): 36 min   Charges:   PT Evaluation $PT Eval Moderate Complexity: 1 Mod   PT General Charges $$ ACUTE PT VISIT: 1 Visit         Rhia Blatchford  DPT Acute Rehab Services [phone] Prefer contact via chat   Leontine KATHEE Hilt 07/05/2024, 9:57 AM

## 2024-07-05 NOTE — Progress Notes (Addendum)
 " PROGRESS NOTE    [name]  FMW:[id number] DOB: Feb 12, 1961 DOA: 07/03/2024 PCP: Gaither Gear, NP-C   chronically ill 64/M with diastolic CHF, morbid obesity, BMI >70, type 2 diabetes, rheumatoid arthritis, history of PE, gout, CKD 3 presented to the ED with chest pain and shortness of breath, intermittent episodes of chest pain since this weekend, he has chronic dyspnea and orthopnea. In the ED sodium 132, creatinine 1.1, proBNP 06/28/2000, troponin 105, CTA chest no acute findings Admitted, started on diuretics  Subjective: - Feels fair, no events overnight  Assessment and Plan:  Acute on chronic diastolic CHF - Echo with EF greater than 75%, indeterminate diastolic parameters, limited by obesity - 2.5 L negative,, volume status is difficult to assess, mild uptrend in creatinine, cards holding Lasix  and losartan  , may need right heart cath, will discuss with cardiology - Continue metoprolol , add Unna boots - Likely poor candidate for SGLT2i - Increase activity, PT OT  Atypical chest pain - Etiology is unclear, has extensive risk factors for CAD - No ACS at this time, CTA chest negative for PE - Cards following, echo without wall motion abnormality  SVT - Noted overnight, now off Cardizem  gtt. - Cards following, continue metoprolol   Uncontrolled type 2 diabetes mellitus with hyperglycemia - Metformin  on hold - CBGs elevated 2 90-3 1 4  range, A1c is 10.6 - Increase Semglee , diabetes coordinator and dietitian consult - Care was discussed with PCP Wanda Gaither, NP [facility], plan to start GLP-1 agonist after discharge, they think he will be unable to manage insulin  at home  Morbid obesity, BMI > 72 Suspected OSA OHS -Has not been formally diagnosed with OSA, will need sleep study as outpatient - Monitor nocturnal sats  Hypothyroidism Continue Synthroid , TSH mildly elevated, recommend repeat labs in 6 weeks   DVT prophylaxis: Add Lovenox  Code Status: Full  code Family Communication: None present Disposition Plan: Home versus short-term rehab  Consultants:    Procedures:   Antimicrobials:    Objective: Vitals:   07/04/24 2343 07/05/24 0314 07/05/24 0742 07/05/24 1104  BP:  (!) 102/52 96/62 (!) 119/55  Pulse: 84 78    Resp: 20 20 17 17   Temp: 97.8 F (36.6 C) 98.4 F (36.9 C) 97.9 F (36.6 C) 98 F (36.7 C)  TempSrc: Oral Oral Oral Oral  SpO2: 94% 97% 98% 95%  Weight:  (!) 203.2 kg    Height:        Intake/Output Summary (Last 24 hours) at 07/05/2024 1207 Last data filed at 07/04/2024 2345 Gross per 24 hour  Intake --  Output 1800 ml  Net -1800 ml   Filed Weights   07/03/24 1508 07/04/24 1639 07/05/24 0314  Weight: (!) 201.9 kg (!) 199.6 kg (!) 203.2 kg    Examination:  General exam: Morbidly obese chronically ill, AAO x 3 Respiratory system: Distant breath sounds Cardiovascular system: S1 & S2 heard, RRR.  Abd: nondistended, soft and nontender.Normal bowel sounds heard. Central nervous system: Alert and oriented. No focal neurological deficits. Extremities: 1+ bilateral edema, weakness stasis changes, thick scaly skin with areas of erythema Skin: As above Psychiatry:  Mood & affect appropriate.     Data Reviewed:   CBC: Recent Labs  Lab 07/03/24 1530 07/04/24 0317 07/05/24 0307 07/05/24 0951  WBC 7.5 8.9 9.9 8.4  NEUTROABS  --  5.4  --   --   HGB 14.1 14.1 13.5 13.8  HCT 42.2 42.3 40.0 40.4  MCV 103.2* 100.7*  99.5 98.8  PLT 158 186 190 197   Basic Metabolic Panel: Recent Labs  Lab 07/03/24 1620 07/03/24 2232 07/04/24 0317 07/05/24 0307 07/05/24 0951  NA 132*  --  133* 131*  --   K 5.0  --  4.1 4.2  --   CL 95*  --  95* 94*  --   CO2 28  --  26 26  --   GLUCOSE 356*  --  298* 303*  --   BUN 15  --  16 23  --   CREATININE 1.13  --  1.07 1.59* 1.60*  CALCIUM  9.1  --  9.0 8.7*  --   MG  --  1.7 1.6*  --   --   PHOS  --   --  3.3  --   --    GFR: Estimated Creatinine Clearance: 78.9  mL/min (A) (by C-G formula based on SCr of 1.6 mg/dL (H)). Liver Function Tests: Recent Labs  Lab 07/04/24 0317 07/05/24 0307  AST 56* 47*  ALT 45* 39  ALKPHOS 125 123  BILITOT 0.6 0.6  PROT 7.2 6.9  ALBUMIN 3.2* 3.0*   Recent Labs  Lab 07/04/24 0317  LIPASE 18   No results for input(s): AMMONIA in the last 168 hours. Coagulation Profile: No results for input(s): INR, PROTIME in the last 168 hours. Cardiac Enzymes: No results for input(s): CKTOTAL, CKMB, CKMBINDEX, TROPONINI in the last 168 hours. BNP (last 3 results) Recent Labs    05/03/24 1557 07/03/24 1620 07/04/24 0317  PROBNP 299.0 1,802.0* 1,527.0*   HbA1C: Recent Labs    07/03/24 2232  HGBA1C 10.6*   CBG: Recent Labs  Lab 07/04/24 1708 07/04/24 2120 07/05/24 0536 07/05/24 0644 07/05/24 1143  GLUCAP 243* 297* 295* 338* 254*   Lipid Profile: Recent Labs    07/03/24 2232  CHOL 188  HDL 41  LDLCALC 120*  TRIG 136  CHOLHDL 4.6   Thyroid Function Tests: Recent Labs    07/03/24 2232  TSH 8.130*  FREET4 0.96   Anemia Panel: No results for input(s): VITAMINB12, FOLATE, FERRITIN, TIBC, IRON, RETICCTPCT in the last 72 hours. Urine analysis:    Component Value Date/Time   COLORURINE YELLOW 12/19/2021 1252   APPEARANCEUR CLEAR 12/19/2021 1252   APPEARANCEUR Clear 12/05/2015 1226   LABSPEC 1.037 (H) 12/19/2021 1252   PHURINE 5.0 12/19/2021 1252   GLUCOSEU >=500 (A) 12/19/2021 1252   HGBUR NEGATIVE 12/19/2021 1252   BILIRUBINUR NEGATIVE 12/19/2021 1252   BILIRUBINUR Negative 12/05/2015 1226   KETONESUR NEGATIVE 12/19/2021 1252   PROTEINUR NEGATIVE 12/19/2021 1252   UROBILINOGEN 0.2 12/28/2013 1117   NITRITE NEGATIVE 12/19/2021 1252   LEUKOCYTESUR NEGATIVE 12/19/2021 1252   Sepsis Labs: @LABRCNTIP (procalcitonin:4,lacticidven:4)  )No results found for this or any previous visit (from the past 240 hours).   Radiology Studies: ECHOCARDIOGRAM COMPLETE Result  Date: 07/04/2024    ECHOCARDIOGRAM REPORT   Patient Name:   Ronnie Hernandez Date of Exam: 07/04/2024 Medical Rec #:  [id number]         Height:       66.0 in Accession #:    [id number]        Weight:       445.0 lb Date of Birth:  1961/04/13        BSA:          2.809 m Patient Age:    64 years          BP:  91/53 mmHg Patient Gender: M                 HR:           138 bpm. Exam Location:  Inpatient Procedure: 2D Echo, Cardiac Doppler, Color Doppler and Intracardiac            Opacification Agent (Both Spectral and Color Flow Doppler were            utilized during procedure). Indications:    CHF - Acute Diastolic  History:        Patient has prior history of Echocardiogram examinations, most                 recent 12/21/2021. CHF, Signs/Symptoms:Shortness of Breath, Chest                 Pain and Edema; Risk Factors:Diabetes, Dyslipidemia,                 Hypertension and Former Smoker.  Sonographer:    Juliene Rucks Referring Phys: [id number] EVA KATHEE PORE  Sonographer Comments: Technically difficult study due to poor echo windows, no subcostal window and patient is obese. Image acquisition challenging due to patient body habitus. IMPRESSIONS  1. Left ventricular ejection fraction, by estimation, is >75%. The left ventricle has hyperdynamic function. The left ventricle has no regional wall motion abnormalities. Left ventricular diastolic parameters are indeterminate.  2. Right ventricular systolic function is normal. The right ventricular size is normal.  3. The mitral valve is normal in structure. No evidence of mitral valve regurgitation. No evidence of mitral stenosis.  4. The aortic valve is tricuspid. Aortic valve regurgitation is not visualized. No aortic stenosis is present. FINDINGS  Left Ventricle: Left ventricular ejection fraction, by estimation, is >75%. The left ventricle has hyperdynamic function. The left ventricle has no regional wall motion abnormalities. Definity  contrast agent was given  IV to delineate the left ventricular endocardial borders. The left ventricular internal cavity size was normal in size. There is no left ventricular hypertrophy. Left ventricular diastolic parameters are indeterminate. Right Ventricle: The right ventricular size is normal. Right ventricular systolic function is normal. Left Atrium: Left atrial size was normal in size. Right Atrium: Right atrial size was normal in size. Pericardium: There is no evidence of pericardial effusion. Mitral Valve: The mitral valve is normal in structure. No evidence of mitral valve regurgitation. No evidence of mitral valve stenosis. Tricuspid Valve: The tricuspid valve is not well visualized. Tricuspid valve regurgitation is trivial. No evidence of tricuspid stenosis. Aortic Valve: The aortic valve is tricuspid. Aortic valve regurgitation is not visualized. No aortic stenosis is present. Pulmonic Valve: The pulmonic valve was not well visualized. Pulmonic valve regurgitation is not visualized. No evidence of pulmonic stenosis. Aorta: The aortic root is normal in size and structure. Venous: The inferior vena cava was not well visualized. IAS/Shunts: The interatrial septum was not well visualized.  LEFT VENTRICLE PLAX 2D LVIDd:         4.00 cm LVIDs:         3.40 cm LV PW:         1.10 cm LV IVS:        1.10 cm LVOT diam:     2.20 cm LVOT Area:     3.80 cm  LV Volumes (MOD) LV vol d, MOD A4C: 117.0 ml LV vol s, MOD A4C: 76.0 ml LV SV MOD A4C:     117.0 ml RIGHT VENTRICLE TAPSE (M-mode):  1.0 cm LEFT ATRIUM         Index LA diam:    2.90 cm 1.03 cm/m   AORTA Ao Root diam: 3.40 cm  SHUNTS Systemic Diam: 2.20 cm Redell Shallow MD Electronically signed by Redell Shallow MD Signature Date/Time: 07/04/2024/8:55:27 AM    Final    CT Angio Chest PE W and/or Wo Contrast Result Date: 07/03/2024 CLINICAL DATA:  Pulmonary embolism (PE) suspected, high prob, chest pain, short of breath EXAM: CT ANGIOGRAPHY CHEST WITH CONTRAST TECHNIQUE: Multidetector  CT imaging of the chest was performed using the standard protocol during bolus administration of intravenous contrast. Multiplanar CT image reconstructions and MIPs were obtained to evaluate the vascular anatomy. RADIATION DOSE REDUCTION: This exam was performed according to the departmental dose-optimization program which includes automated exposure control, adjustment of the mA and/or kV according to patient size and/or use of iterative reconstruction technique. CONTRAST:  OMNIPAQUE  IOHEXOL  350 MG/ML SOLN COMPARISON:  03/03/2025, 05/03/2024 FINDINGS: Cardiovascular: This is a technically adequate evaluation of the pulmonary vasculature. No filling defects or pulmonary emboli. The heart is unremarkable without pericardial effusion. No evidence of thoracic aortic aneurysm or dissection. Atherosclerosis of the thoracic aorta. Mediastinum/Nodes: No enlarged mediastinal, hilar, or axillary lymph nodes. Thyroid gland, trachea, and esophagus demonstrate no significant findings. Lungs/Pleura: Minimal hypoventilatory changes at the lung bases. No airspace disease, effusion, or pneumothorax. Central airways are patent. Upper Abdomen: No acute abnormality. Musculoskeletal: No acute or destructive bony abnormalities. Reconstructed images demonstrate no additional findings. Review of the MIP images confirms the above findings. IMPRESSION: 1. No evidence of pulmonary embolus. 2. No acute intrathoracic process. 3.  Aortic Atherosclerosis (ICD10-I70.0). Electronically Signed   By: Ozell Daring M.D.   On: 07/03/2024 18:23   DG Chest Port 1 View Result Date: 07/03/2024 CLINICAL DATA:  Chest pain, short of breath EXAM: PORTABLE CHEST 1 VIEW COMPARISON:  05/03/2024 FINDINGS: The heart size and mediastinal contours are within normal limits. Both lungs are clear. The visualized skeletal structures are unremarkable. IMPRESSION: No active disease. Electronically Signed   By: Ozell Daring M.D.   On: 07/03/2024 15:45      Scheduled Meds:  aspirin  EC  81 mg Oral Daily   atorvastatin   80 mg Oral Daily   enoxaparin  (LOVENOX ) injection  100 mg Subcutaneous Q24H   fenofibrate   160 mg Oral Daily   insulin  aspart  0-15 Units Subcutaneous TID WC   insulin  aspart  0-5 Units Subcutaneous QHS   insulin  aspart  5 Units Subcutaneous TID WC   insulin  glargine  25 Units Subcutaneous Daily   [START ON 07/06/2024] levothyroxine   150 mcg Oral QODAY   And   levothyroxine   175 mcg Oral QODAY   metoprolol  tartrate  25 mg Oral BID   pantoprazole  (PROTONIX ) IV  40 mg Intravenous Q24H   Continuous Infusions:     LOS: 1 day    Time spent:    Sigurd Pac, MD [facility]   07/05/2024, 12:07 PM    "

## 2024-07-05 NOTE — Plan of Care (Signed)
" °  Problem: Fluid Volume: Goal: Ability to maintain a balanced intake and output will improve Outcome: Progressing   Problem: Nutritional: Goal: Maintenance of adequate nutrition will improve Outcome: Progressing   Problem: Education: Goal: Knowledge of General Education information will improve Description: Including pain rating scale, medication(s)/side effects and non-pharmacologic comfort measures Outcome: Progressing   Problem: Clinical Measurements: Goal: Respiratory complications will improve Outcome: Progressing   Problem: Activity: Goal: Risk for activity intolerance will decrease Outcome: Progressing   Problem: Safety: Goal: Ability to remain free from injury will improve Outcome: Progressing   "

## 2024-07-06 ENCOUNTER — Encounter (HOSPITAL_COMMUNITY)
Admission: EM | Disposition: A | Discharge status: Home Health | Payer: Self-pay | Source: Ambulatory Visit | Attending: Internal Medicine

## 2024-07-06 DIAGNOSIS — I5033 Acute on chronic diastolic (congestive) heart failure: Secondary | ICD-10-CM | POA: Diagnosis not present

## 2024-07-06 DIAGNOSIS — I471 Supraventricular tachycardia, unspecified: Secondary | ICD-10-CM | POA: Diagnosis not present

## 2024-07-06 HISTORY — PX: RIGHT HEART CATH: CATH118263

## 2024-07-06 LAB — BASIC METABOLIC PANEL WITH GFR
Anion gap: 10 (ref 5–15)
BUN: 27 mg/dL — ABNORMAL HIGH (ref 8–23)
CO2: 29 mmol/L (ref 22–32)
Calcium: 8.7 mg/dL — ABNORMAL LOW (ref 8.9–10.3)
Chloride: 93 mmol/L — ABNORMAL LOW (ref 98–111)
Creatinine, Ser: 1.51 mg/dL — ABNORMAL HIGH (ref 0.61–1.24)
GFR, Estimated: 51 mL/min — ABNORMAL LOW
Glucose, Bld: 243 mg/dL — ABNORMAL HIGH (ref 70–99)
Potassium: 4.3 mmol/L (ref 3.5–5.1)
Sodium: 131 mmol/L — ABNORMAL LOW (ref 135–145)

## 2024-07-06 LAB — GLUCOSE, CAPILLARY
Glucose-Capillary: 202 mg/dL — ABNORMAL HIGH (ref 70–99)
Glucose-Capillary: 290 mg/dL — ABNORMAL HIGH (ref 70–99)
Glucose-Capillary: 235 mg/dL — ABNORMAL HIGH (ref 70–99)
Glucose-Capillary: 287 mg/dL — ABNORMAL HIGH (ref 70–99)

## 2024-07-06 LAB — MAGNESIUM: Magnesium: 2.1 mg/dL (ref 1.7–2.4)

## 2024-07-06 MED ORDER — INSULIN GLARGINE 100 UNIT/ML ~~LOC~~ SOLN
35.0000 [IU] | Freq: Every day | SUBCUTANEOUS | Status: DC
  Administered 2024-07-06: 35 [IU] via SUBCUTANEOUS
  Filled 2024-07-06 (×2): qty 0.35

## 2024-07-06 MED ORDER — PANTOPRAZOLE SODIUM 40 MG PO TBEC
40.0000 mg | DELAYED_RELEASE_TABLET | Freq: Every day | ORAL | Status: DC
  Administered 2024-07-06 – 2024-07-09 (×4): 40 mg via ORAL
  Filled 2024-07-06 (×4): qty 1

## 2024-07-06 MED ORDER — SODIUM CHLORIDE 0.9% FLUSH: 3.0000 mL | INTRAVENOUS | Status: DC | PRN

## 2024-07-06 MED ORDER — SODIUM CHLORIDE 0.9% FLUSH
3.0000 mL | Freq: Two times a day (BID) | INTRAVENOUS | Status: DC
  Administered 2024-07-06 – 2024-07-10 (×8): 3 mL via INTRAVENOUS

## 2024-07-06 MED ORDER — LIDOCAINE HCL (PF) 1 % IJ SOLN
INTRAMUSCULAR | Status: DC | PRN
  Administered 2024-07-06: 2 mL

## 2024-07-06 MED ORDER — SODIUM CHLORIDE 0.9 % IV SOLN: 250.0000 mL | INTRAVENOUS | Status: DC | PRN

## 2024-07-06 MED ORDER — LIDOCAINE HCL (PF) 1 % IJ SOLN
INTRAMUSCULAR | Status: AC
  Filled 2024-07-06: qty 30

## 2024-07-06 MED ORDER — METOPROLOL SUCCINATE ER 50 MG PO TB24
50.0000 mg | ORAL_TABLET | Freq: Every day | ORAL | Status: DC
  Administered 2024-07-06 – 2024-07-10 (×5): 50 mg via ORAL
  Filled 2024-07-06 (×6): qty 1

## 2024-07-06 MED ORDER — SODIUM CHLORIDE 0.9 % IV SOLN: 250.0000 mL | INTRAVENOUS | Status: AC | PRN

## 2024-07-06 MED ORDER — INSULIN GLARGINE 100 UNIT/ML ~~LOC~~ SOLN
30.0000 [IU] | Freq: Every day | SUBCUTANEOUS | Status: DC
  Filled 2024-07-06: qty 0.3

## 2024-07-06 MED ORDER — HEPARIN (PORCINE) IN NACL 1000-0.9 UT/500ML-% IV SOLN
INTRAVENOUS | Status: DC | PRN
  Administered 2024-07-06: 500 mL

## 2024-07-06 MED ORDER — DILTIAZEM HCL 30 MG PO TABS
30.0000 mg | ORAL_TABLET | Freq: Once | ORAL | Status: AC
  Administered 2024-07-06: 30 mg via ORAL
  Filled 2024-07-06: qty 1

## 2024-07-06 MED ORDER — INSULIN ASPART 100 UNIT/ML IJ SOLN
6.0000 [IU] | Freq: Three times a day (TID) | INTRAMUSCULAR | Status: DC
  Administered 2024-07-06 – 2024-07-10 (×12): 6 [IU] via SUBCUTANEOUS
  Filled 2024-07-06 (×12): qty 6

## 2024-07-06 MED ORDER — DILTIAZEM HCL ER COATED BEADS 180 MG PO CP24
180.0000 mg | ORAL_CAPSULE | Freq: Every day | ORAL | Status: DC
  Filled 2024-07-06: qty 1

## 2024-07-06 MED ORDER — SODIUM CHLORIDE 0.9% FLUSH
3.0000 mL | Freq: Two times a day (BID) | INTRAVENOUS | Status: DC
  Administered 2024-07-06: 3 mL via INTRAVENOUS

## 2024-07-06 MED ORDER — DILTIAZEM HCL ER COATED BEADS 240 MG PO CP24
240.0000 mg | ORAL_CAPSULE | Freq: Every day | ORAL | Status: DC
  Administered 2024-07-06 – 2024-07-09 (×4): 240 mg via ORAL
  Filled 2024-07-06 (×4): qty 1

## 2024-07-06 NOTE — Progress Notes (Addendum)
 " PROGRESS NOTE    Ronnie Hernandez  FMW:[id number] DOB: [birth year] DOA: 07/03/2024 PCP: Gaither Gear, NP-C   chronically ill 63/M with diastolic CHF, morbid obesity, BMI >70, type 2 diabetes, rheumatoid arthritis, history of PE, gout, CKD 3 presented to the ED with chest pain and shortness of breath, intermittent episodes of chest pain since this weekend, he has chronic dyspnea and orthopnea. In the ED sodium 132, creatinine 1.1, proBNP 06/28/2000, troponin 105, CTA chest no acute findings Admitted, started on diuretics - Intermittent episodes of SVT  Subjective: - Chest pain again last night, tachycardic, denies much dyspnea, intermittent SVT  Assessment and Plan:  Acute on chronic diastolic CHF - Echo with EF greater than 75%, indeterminate diastolic parameters, limited by obesity - 2.8L negative,, volume status is difficult to assess, mild uptrend in creatinine, cards holding Lasix  and losartan  , may need right heart cath, will discuss with cardiology - Continue metoprolol ,  Unna boots - Likely poor candidate for SGLT2i - Increase activity, PT OT, requiring 2+ assistance, TOC following, SNF may not be an option due to weight  Atypical chest pain - Etiology is unclear, has extensive risk factors for CAD - No ACS at this time, CTA chest negative for PE - Cards following, echo without wall motion abnormality - Recurrent symptoms again overnight, will discuss with cardiology  SVT - Required Cardizem  gtt. on admission, now off - Cards following, started on Cardizem  p.o., continue metoprolol , dose increased  Uncontrolled type 2 diabetes mellitus with hyperglycemia - Metformin  on hold - CBGs remain elevated, A1c is 10.6 - Increase Semglee , and meal coverage diabetes coordinator and dietitian consult - Care was discussed with PCP Wanda Gaither, NP at [facility], plan to start GLP-1 agonist after discharge, they think he will be unable to manage insulin  at home  Morbid obesity,  BMI > 72 Suspected OSA OHS -Has not been formally diagnosed with OSA, will need sleep study as outpatient - Monitor nocturnal sats  Hypothyroidism Continue Synthroid , TSH mildly elevated, recommend repeat labs in 6 weeks   DVT prophylaxis:  Lovenox  Code Status: Full code Family Communication: None present Disposition Plan: Home versus short-term rehab  Consultants:    Procedures:   Antimicrobials:    Objective: Vitals:   07/05/24 2101 07/05/24 2258 07/06/24 0336 07/06/24 0721  BP: (!) 91/50 123/87 139/71 116/63  Pulse:  72  67  Resp: 15 20  20   Temp:  97.9 F (36.6 C) (!) 97.5 F (36.4 C) 98.3 F (36.8 C)  TempSrc:  Oral Oral Oral  SpO2:  98% 100% 100%  Weight:   (!) 203.7 kg   Height:        Intake/Output Summary (Last 24 hours) at 07/06/2024 0948 Last data filed at 07/06/2024 9660 Gross per 24 hour  Intake --  Output 300 ml  Net -300 ml   Filed Weights   07/04/24 1639 07/05/24 0314 07/06/24 0336  Weight: (!) 199.6 kg (!) 203.2 kg (!) 203.7 kg    Examination:  General exam: Morbidly obese, chronically ill-appearing, AAO x 3 HEENT: Neck obese unable to assess JVD CVS: S1-S2, regular rhythm Lungs: Distant breath sounds  Abd: nondistended, soft and nontender.Normal bowel sounds heard. Central nervous system: Alert and oriented. No focal neurological deficits. Extremities: 1+ bilateral edema, venous stasis changes, now with Unna boots Skin: As above Psychiatry:  Mood & affect appropriate.     Data Reviewed:   CBC: Recent Labs  Lab 07/03/24 1530 07/04/24 0317 07/05/24 9692  07/05/24 0951  WBC 7.5 8.9 9.9 8.4  NEUTROABS  --  5.4  --   --   HGB 14.1 14.1 13.5 13.8  HCT 42.2 42.3 40.0 40.4  MCV 103.2* 100.7* 99.5 98.8  PLT 158 186 190 197   Basic Metabolic Panel: Recent Labs  Lab 07/03/24 1620 07/03/24 2232 07/04/24 0317 07/05/24 0307 07/05/24 0951 07/06/24 0415  NA 132*  --  133* 131*  --  131*  K 5.0  --  4.1 4.2  --  4.3  CL 95*  --   95* 94*  --  93*  CO2 28  --  26 26  --  29  GLUCOSE 356*  --  298* 303*  --  243*  BUN 15  --  16 23  --  27*  CREATININE 1.13  --  1.07 1.59* 1.60* 1.51*  CALCIUM  9.1  --  9.0 8.7*  --  8.7*  MG  --  1.7 1.6*  --   --  2.1  PHOS  --   --  3.3  --   --   --    GFR: Estimated Creatinine Clearance: 83.7 mL/min (A) (by C-G formula based on SCr of 1.51 mg/dL (H)). Liver Function Tests: Recent Labs  Lab 07/04/24 0317 07/05/24 0307  AST 56* 47*  ALT 45* 39  ALKPHOS 125 123  BILITOT 0.6 0.6  PROT 7.2 6.9  ALBUMIN 3.2* 3.0*   Recent Labs  Lab 07/04/24 0317  LIPASE 18   No results for input(s): AMMONIA in the last 168 hours. Coagulation Profile: No results for input(s): INR, PROTIME in the last 168 hours. Cardiac Enzymes: No results for input(s): CKTOTAL, CKMB, CKMBINDEX, TROPONINI in the last 168 hours. BNP (last 3 results) Recent Labs    05/03/24 1557 07/03/24 1620 07/04/24 0317  PROBNP 299.0 1,802.0* 1,527.0*   HbA1C: Recent Labs    07/03/24 2232  HGBA1C 10.6*   CBG: Recent Labs  Lab 07/05/24 0644 07/05/24 1143 07/05/24 1804 07/05/24 2036 07/06/24 0618  GLUCAP 338* 254* 246* 319* 202*   Lipid Profile: Recent Labs    07/03/24 2232  CHOL 188  HDL 41  LDLCALC 120*  TRIG 136  CHOLHDL 4.6   Thyroid Function Tests: Recent Labs    07/03/24 2232  TSH 8.130*  FREET4 0.96   Anemia Panel: No results for input(s): VITAMINB12, FOLATE, FERRITIN, TIBC, IRON, RETICCTPCT in the last 72 hours. Urine analysis:    Component Value Date/Time   COLORURINE YELLOW 12/19/2021 1252   APPEARANCEUR CLEAR 12/19/2021 1252   APPEARANCEUR Clear 12/05/2015 1226   LABSPEC 1.037 (H) 12/19/2021 1252   PHURINE 5.0 12/19/2021 1252   GLUCOSEU >=500 (A) 12/19/2021 1252   HGBUR NEGATIVE 12/19/2021 1252   BILIRUBINUR NEGATIVE 12/19/2021 1252   BILIRUBINUR Negative 12/05/2015 1226   KETONESUR NEGATIVE 12/19/2021 1252   PROTEINUR NEGATIVE 12/19/2021  1252   UROBILINOGEN 0.2 12/28/2013 1117   NITRITE NEGATIVE 12/19/2021 1252   LEUKOCYTESUR NEGATIVE 12/19/2021 1252   Sepsis Labs: @LABRCNTIP (procalcitonin:4,lacticidven:4)  )No results found for this or any previous visit (from the past 240 hours).   Radiology Studies: No results found.    Scheduled Meds:  aspirin  EC  81 mg Oral Daily   atorvastatin   80 mg Oral Daily   diltiazem   240 mg Oral Daily   diltiazem   30 mg Oral Once   enoxaparin  (LOVENOX ) injection  100 mg Subcutaneous Q24H   fenofibrate   160 mg Oral Daily   insulin  aspart  0-15  Units Subcutaneous TID WC   insulin  aspart  0-5 Units Subcutaneous QHS   insulin  aspart  5 Units Subcutaneous TID WC   insulin  glargine  30 Units Subcutaneous Daily   levothyroxine   150 mcg Oral QODAY   And   levothyroxine   175 mcg Oral QODAY   metoprolol  succinate  50 mg Oral Daily   pantoprazole  (PROTONIX ) IV  40 mg Intravenous Q24H   Continuous Infusions:     LOS: 2 days    Time spent:    Sigurd Pac, MD [facility]   07/06/2024, 9:48 AM    "

## 2024-07-06 NOTE — Plan of Care (Signed)

## 2024-07-06 NOTE — Inpatient Diabetes Management (Signed)
 Inpatient Diabetes Program Recommendations  AACE/ADA: New Consensus Statement on Inpatient Glycemic Control   Target Ranges:  Prepandial:   less than 140 mg/dL      Peak postprandial:   less than 180 mg/dL (1-2 hours)      Critically ill patients:  140 - 180 mg/dL   Lab Results  Component Value Date   GLUCAP 290 (H) 07/06/2024   HGBA1C 10.6 (H) 07/03/2024    Latest Reference Range & Units 07/05/24 11:43 07/05/24 18:04 07/05/24 20:36 07/06/24 06:18 07/06/24 11:39  Glucose-Capillary 70 - 99 mg/dL 745 (H) 753 (H) 680 (H) 202 (H) 290 (H)   Review of Glycemic Control  Diabetes history: DM 2  Outpatient Diabetes medications:  Invokana 300 mg daily Metformin  1000 mg bid Rybelsus 3 mg daily  Current orders for Inpatient glycemic control:  Novolog  0-15 units tid with meals and HS Lantus  25 units daily Novolog  5 units tid with meals   Inpatient Diabetes Program Recommendations:   Spoke with patient  at bedside about diabetes and home regimen for diabetes control. Patient reports taking DM medications as prescribed. Patient reports he does not check his blood glucose at home. Inquired about prior A1C and patient reports not being able to recall last A1C value. Discussed A1C results 10.6% and explained that current A1C indicates an average glucose of 258 mg/dl over the past 2-3 months.  Discussed glucose and A1C goals. Stressed to the patient the importance of improving glycemic control to prevent further complications from uncontrolled diabetes. Discussed impact of nutrition, stress, sickness, and medications on diabetes control.  Discussed carbohydrates, carbohydrate goals per day and meal, along with portion sizes. Patient states prior to admission he was drinking regular sodas and juice. Encouraged patient to choose diet, sugar free or water instead. Encouraged patient to check glucose 4 times per day (before meals and at bedtime)  Discussed possible Insulin  and GLP options. Dicussed short  acting vs long acting insulin . Patient states he thinks he has taken a GLP in the past and it cause him to have GI issues. Reviewed all steps of insulin  pen including attachment of needle, 2-unit air shot, dialing up dose, giving injection, removing needle, disposal of sharps, storage of unused insulin , disposal of insulin  etc. Patient able to provide return demonstration on insulin  administration; however, with contracted hands he did have difficulty.   Patient verbalized understanding of information discussed and reports no further questions at this time related to diabetes.  Thanks,  Lavanda Search, RN, MSN, [facility]  Inpatient Diabetes Coordinator  Pager [phone] (8a-5p)

## 2024-07-06 NOTE — TOC Progression Note (Signed)
 Transition of Care [facility]) - Progression Note    Patient Details  Name: Ronnie Hernandez MRN: [id number] Date of Birth: Jul 26, 1960  [facility] CM/SW Contact  Waddell Barnie Rama, RN Phone Number: 07/06/2024, 3:09 PM  Clinical Narrative:    NCM contacted Tobey with [facility], she transferred this NCM to the [facility] , asked if patient could get increased hours at home, due to not being able to go to SNF.  The Coordinator said she will have to share this with the team and get back with this NCM.  She is not sure when they will get back with this NCM ,  informed her looks like EDD is 1/19.   Expected Discharge Plan: Home w Home Health Services Barriers to Discharge: Continued Medical Work up               Expected Discharge Plan and Services In-house Referral: NA Discharge Planning Services: CM Consult Post Acute Care Choice: Home Health Living arrangements for the past 2 months: Apartment                                       Social Drivers of Health (SDOH) Interventions SDOH Screenings   Food Insecurity: No Food Insecurity (07/04/2024)  Housing: Unknown (07/05/2024)  Transportation Needs: No Transportation Needs (07/04/2024)  Utilities: Not At Risk (07/04/2024)  Alcohol Screen: Low Risk (07/05/2024)  Financial Resource Strain: Low Risk (07/05/2024)  Social Connections: Unknown (07/04/2024)  Tobacco Use: Medium Risk (07/03/2024)    Readmission Risk Interventions     No data to display

## 2024-07-06 NOTE — Interval H&P Note (Signed)
 History and Physical Interval Note:  07/06/2024 3:41 PM  Ronnie Hernandez  has presented today for surgery, with the diagnosis of chf.  The various methods of treatment have been discussed with the patient and family. After consideration of risks, benefits and other options for treatment, the patient has consented to  Procedures: RIGHT HEART CATH (N/A) as a surgical intervention.  The patient's history has been reviewed, patient examined, no change in status, stable for surgery.  I have reviewed the patient's chart and labs.  Questions were answered to the patient's satisfaction.     Ozell Fell

## 2024-07-06 NOTE — Progress Notes (Addendum)
 "  Progress Note  Patient Name: Ronnie Hernandez Date of Encounter: 07/06/2024  Primary Cardiologist: None   Subjective   Multiple episodes of palpitations and chest pain overnight found to have recurrent SVT. Was given metoprolol  earlier in the evening by PO and IV and was hypotensive.   Today feels like he is closer to his baseline.   Inpatient Medications    Scheduled Meds:  aspirin  EC  81 mg Oral Daily   atorvastatin   80 mg Oral Daily   enoxaparin  (LOVENOX ) injection  100 mg Subcutaneous Q24H   fenofibrate   160 mg Oral Daily   insulin  aspart  0-15 Units Subcutaneous TID WC   insulin  aspart  0-5 Units Subcutaneous QHS   insulin  aspart  5 Units Subcutaneous TID WC   insulin  glargine  25 Units Subcutaneous Daily   levothyroxine   150 mcg Oral QODAY   And   levothyroxine   175 mcg Oral QODAY   metoprolol  tartrate  50 mg Oral BID   pantoprazole  (PROTONIX ) IV  40 mg Intravenous Q24H   Continuous Infusions:   PRN Meds: acetaminophen  **OR** acetaminophen , alum & mag hydroxide-simeth, melatonin, methocarbamol    Vital Signs    Vitals:   07/05/24 2101 07/05/24 2258 07/06/24 0336 07/06/24 0721  BP: (!) 91/50 123/87 139/71 116/63  Pulse:  72  67  Resp: 15 20  20   Temp:  97.9 F (36.6 C) (!) 97.5 F (36.4 C) 98.3 F (36.8 C)  TempSrc:  Oral Oral Oral  SpO2:  98% 100% 100%  Weight:   (!) 203.7 kg   Height:        Intake/Output Summary (Last 24 hours) at 07/06/2024 0740 Last data filed at 07/06/2024 9660 Gross per 24 hour  Intake --  Output 300 ml  Net -300 ml   Filed Weights   07/04/24 1639 07/05/24 0314 07/06/24 0336  Weight: (!) 199.6 kg (!) 203.2 kg (!) 203.7 kg    Telemetry    Atrial fibrillation with RVR, spontaneous conversion to normal sinus rhythm at roughly 15 a.m.- Personally Reviewed  ECG    Atrial fibrillation with RVR- Personally Reviewed  Physical Exam   Physical Exam Vitals and nursing note reviewed.  Constitutional:      Appearance:  Normal appearance. He is obese.  HENT:     Head: Normocephalic and atraumatic.  Eyes:     Conjunctiva/sclera: Conjunctivae normal.  Neck:     Vascular: No hepatojugular reflux or JVD.  Cardiovascular:     Rate and Rhythm: Normal rate and regular rhythm.  Pulmonary:     Effort: Pulmonary effort is normal.     Breath sounds: Normal breath sounds.  Musculoskeletal:     Comments: Chronic lower extremity swelling  Neurological:     Mental Status: He is alert.      Labs    Chemistry Recent Labs  Lab 07/04/24 0317 07/05/24 0307 07/05/24 0951 07/06/24 0415  NA 133* 131*  --  131*  K 4.1 4.2  --  4.3  CL 95* 94*  --  93*  CO2 26 26  --  29  GLUCOSE 298* 303*  --  243*  BUN 16 23  --  27*  CREATININE 1.07 1.59* 1.60* 1.51*  CALCIUM  9.0 8.7*  --  8.7*  PROT 7.2 6.9  --   --   ALBUMIN 3.2* 3.0*  --   --   AST 56* 47*  --   --   ALT 45* 39  --   --  ALKPHOS 125 123  --   --   BILITOT 0.6 0.6  --   --   GFRNONAA >60 48* 48* 51*  ANIONGAP 11 11  --  10     Hematology Recent Labs  Lab 07/04/24 0317 07/05/24 0307 07/05/24 0951  WBC 8.9 9.9 8.4  RBC 4.20* 4.02* 4.09*  HGB 14.1 13.5 13.8  HCT 42.3 40.0 40.4  MCV 100.7* 99.5 98.8  MCH 33.6 33.6 33.7  MCHC 33.3 33.8 34.2  RDW 12.8 12.7 12.8  PLT 186 190 197    Cardiac EnzymesNo results for input(s): TROPONINI in the last 168 hours. No results for input(s): TROPIPOC in the last 168 hours.   BNP Recent Labs  Lab 07/03/24 1620 07/04/24 0317  PROBNP 1,802.0* 1,527.0*     DDimer No results for input(s): DDIMER in the last 168 hours.   Radiology    ECHOCARDIOGRAM COMPLETE Result Date: 07/04/2024    ECHOCARDIOGRAM REPORT   Patient Name:   Ronnie Hernandez Date of Exam: 07/04/2024 Medical Rec #:  [id number]         Height:       66.0 in Accession #:    [id number]        Weight:       445.0 lb Date of Birth:  [birth year]        BSA:          2.809 m Patient Age:    63 years          BP:           91/53 mmHg  Patient Gender: M                 HR:           138 bpm. Exam Location:  Inpatient Procedure: 2D Echo, Cardiac Doppler, Color Doppler and Intracardiac            Opacification Agent (Both Spectral and Color Flow Doppler were            utilized during procedure). Indications:    CHF - Acute Diastolic  History:        Patient has prior history of Echocardiogram examinations, most                 recent 12/21/2021. CHF, Signs/Symptoms:Shortness of Breath, Chest                 Pain and Edema; Risk Factors:Diabetes, Dyslipidemia,                 Hypertension and Former Smoker.  Sonographer:    Juliene Rucks Referring Phys: [id number] EVA KATHEE PORE  Sonographer Comments: Technically difficult study due to poor echo windows, no subcostal window and patient is obese. Image acquisition challenging due to patient body habitus. IMPRESSIONS  1. Left ventricular ejection fraction, by estimation, is >75%. The left ventricle has hyperdynamic function. The left ventricle has no regional wall motion abnormalities. Left ventricular diastolic parameters are indeterminate.  2. Right ventricular systolic function is normal. The right ventricular size is normal.  3. The mitral valve is normal in structure. No evidence of mitral valve regurgitation. No evidence of mitral stenosis.  4. The aortic valve is tricuspid. Aortic valve regurgitation is not visualized. No aortic stenosis is present. FINDINGS  Left Ventricle: Left ventricular ejection fraction, by estimation, is >75%. The left ventricle has hyperdynamic function. The left ventricle has no regional wall motion abnormalities. Definity  contrast agent was given IV to  delineate the left ventricular endocardial borders. The left ventricular internal cavity size was normal in size. There is no left ventricular hypertrophy. Left ventricular diastolic parameters are indeterminate. Right Ventricle: The right ventricular size is normal. Right ventricular systolic function is normal. Left  Atrium: Left atrial size was normal in size. Right Atrium: Right atrial size was normal in size. Pericardium: There is no evidence of pericardial effusion. Mitral Valve: The mitral valve is normal in structure. No evidence of mitral valve regurgitation. No evidence of mitral valve stenosis. Tricuspid Valve: The tricuspid valve is not well visualized. Tricuspid valve regurgitation is trivial. No evidence of tricuspid stenosis. Aortic Valve: The aortic valve is tricuspid. Aortic valve regurgitation is not visualized. No aortic stenosis is present. Pulmonic Valve: The pulmonic valve was not well visualized. Pulmonic valve regurgitation is not visualized. No evidence of pulmonic stenosis. Aorta: The aortic root is normal in size and structure. Venous: The inferior vena cava was not well visualized. IAS/Shunts: The interatrial septum was not well visualized.  LEFT VENTRICLE PLAX 2D LVIDd:         4.00 cm LVIDs:         3.40 cm LV PW:         1.10 cm LV IVS:        1.10 cm LVOT diam:     2.20 cm LVOT Area:     3.80 cm  LV Volumes (MOD) LV vol d, MOD A4C: 117.0 ml LV vol s, MOD A4C: 76.0 ml LV SV MOD A4C:     117.0 ml RIGHT VENTRICLE TAPSE (M-mode): 1.0 cm LEFT ATRIUM         Index LA diam:    2.90 cm 1.03 cm/m   AORTA Ao Root diam: 3.40 cm  SHUNTS Systemic Diam: 2.20 cm Redell Shallow MD Electronically signed by Redell Shallow MD Signature Date/Time: 07/04/2024/8:55:27 AM    Final     Cardiac Studies   Echocardiogram 07/04/2024:    1. Left ventricular ejection fraction, by estimation, is >75%. The left  ventricle has hyperdynamic function. The left ventricle has no regional  wall motion abnormalities. Left ventricular diastolic parameters are  indeterminate.   2. Right ventricular systolic function is normal. The right ventricular  size is normal.   3. The mitral valve is normal in structure. No evidence of mitral valve  regurgitation. No evidence of mitral stenosis.   4. The aortic valve is tricuspid.  Aortic valve regurgitation is not  visualized. No aortic stenosis is present.   Patient Profile     This is a 64 year old male with past medical history of chronic diastolic heart failure, morbid obesity, hypertension, RA, type 2 diabetes, gout, CKD from cardiology was consulted for chest pain and concern for heart failure exacerbation.   Assessment & Plan   Acute on chronic diastolic heart failure with difficult volume assessment due to body habitus-likely multifactorial: Suspected undiagnosed OSA and has a high salt diet.  Received Lasix  80 mg IV BID and a dose of Diamox .  Weight and I/os inaccurate.  Echo shows hyperdynamic function but was performed while patient was in SVT. Appears euvolemic today but volume status is very difficult to assess and creatinine is slightly elevated.  Continue salt restriction. Will see if he can undergo RHC today. May consider Cardiomems as an outpatient due to difficult volume assessment. NSTEMI, likely demand ischemia in the setting of heart failure-troponin flat x 2.  Echocardiogram as above.  Can consider outpatient stress testing. Copntinue aspirin . Paroxysmal  SVT  EKG labeled AF but looks more like SVT from the ED and no fibrillation waves on telemetry during conversion to NSR. Symptoms of chest pain and hot flashes are likely due to paroxysmal SVT.  Continues to have paroxysms during hospitalization. Strongly advised to decrease salt intake, lose weight and get a sleep study as he likely has undiagnosed sleep apnea. TSH elevated with normal free T4. Had an episode last night but had some hypotension which I suspect is from too much beta blockers. Will ask our EP colleagues to evaluate and assist with management.  AKI on CKD improving Suspected undiagnosed sleep apnea recommended outpatient sleep study Hypomagnesemia recheck Hyponatremia-  likely from volume overload.  Improving with diuresis Morbid obesity Hyperlipidemia Type 2 diabetes with  hyperglycemia Prolonged QT avoid QT prolonging agents and maintain K greater than 4.0 and mag greater than 2    Informed Consent   Shared Decision Making/Informed Consent The risks, including but not limited to, [bleeding or vascular complications (1 in 500), pneumothorax (1 in 1600), arrhythmia (1 in 1000) and death (1 in 5000)], benefits (diagnostic support and/or management of heart failure, pulmonary hypertension) and alternatives of a right heart catheterization were discussed in detail with Mr. Lust and he is willing to proceed.        For questions or updates, please contact [facility] Please consult [email or web address] for contact info under        Signed, Emeline Calender, DO 07/06/2024, 7:40 AM    "

## 2024-07-06 NOTE — H&P (View-Only) (Signed)
 "  Progress Note  Patient Name: Ronnie Hernandez Date of Encounter: 07/06/2024  Primary Cardiologist: None   Subjective   Multiple episodes of palpitations and chest pain overnight found to have recurrent SVT. Was given metoprolol  earlier in the evening by PO and IV and was hypotensive.   Today feels like he is closer to his baseline.   Inpatient Medications    Scheduled Meds:  aspirin  EC  81 mg Oral Daily   atorvastatin   80 mg Oral Daily   enoxaparin  (LOVENOX ) injection  100 mg Subcutaneous Q24H   fenofibrate   160 mg Oral Daily   insulin  aspart  0-15 Units Subcutaneous TID WC   insulin  aspart  0-5 Units Subcutaneous QHS   insulin  aspart  5 Units Subcutaneous TID WC   insulin  glargine  25 Units Subcutaneous Daily   levothyroxine   150 mcg Oral QODAY   And   levothyroxine   175 mcg Oral QODAY   metoprolol  tartrate  50 mg Oral BID   pantoprazole  (PROTONIX ) IV  40 mg Intravenous Q24H   Continuous Infusions:   PRN Meds: acetaminophen  **OR** acetaminophen , alum & mag hydroxide-simeth, melatonin, methocarbamol    Vital Signs    Vitals:   07/05/24 2101 07/05/24 2258 07/06/24 0336 07/06/24 0721  BP: (!) 91/50 123/87 139/71 116/63  Pulse:  72  67  Resp: 15 20  20   Temp:  97.9 F (36.6 C) (!) 97.5 F (36.4 C) 98.3 F (36.8 C)  TempSrc:  Oral Oral Oral  SpO2:  98% 100% 100%  Weight:   (!) 203.7 kg   Height:        Intake/Output Summary (Last 24 hours) at 07/06/2024 0740 Last data filed at 07/06/2024 9660 Gross per 24 hour  Intake --  Output 300 ml  Net -300 ml   Filed Weights   07/04/24 1639 07/05/24 0314 07/06/24 0336  Weight: (!) 199.6 kg (!) 203.2 kg (!) 203.7 kg    Telemetry    Atrial fibrillation with RVR, spontaneous conversion to normal sinus rhythm at roughly 15 a.m.- Personally Reviewed  ECG    Atrial fibrillation with RVR- Personally Reviewed  Physical Exam   Physical Exam Vitals and nursing note reviewed.  Constitutional:      Appearance:  Normal appearance. He is obese.  HENT:     Head: Normocephalic and atraumatic.  Eyes:     Conjunctiva/sclera: Conjunctivae normal.  Neck:     Vascular: No hepatojugular reflux or JVD.  Cardiovascular:     Rate and Rhythm: Normal rate and regular rhythm.  Pulmonary:     Effort: Pulmonary effort is normal.     Breath sounds: Normal breath sounds.  Musculoskeletal:     Comments: Chronic lower extremity swelling  Neurological:     Mental Status: He is alert.      Labs    Chemistry Recent Labs  Lab 07/04/24 0317 07/05/24 0307 07/05/24 0951 07/06/24 0415  NA 133* 131*  --  131*  K 4.1 4.2  --  4.3  CL 95* 94*  --  93*  CO2 26 26  --  29  GLUCOSE 298* 303*  --  243*  BUN 16 23  --  27*  CREATININE 1.07 1.59* 1.60* 1.51*  CALCIUM  9.0 8.7*  --  8.7*  PROT 7.2 6.9  --   --   ALBUMIN 3.2* 3.0*  --   --   AST 56* 47*  --   --   ALT 45* 39  --   --  ALKPHOS 125 123  --   --   BILITOT 0.6 0.6  --   --   GFRNONAA >60 48* 48* 51*  ANIONGAP 11 11  --  10     Hematology Recent Labs  Lab 07/04/24 0317 07/05/24 0307 07/05/24 0951  WBC 8.9 9.9 8.4  RBC 4.20* 4.02* 4.09*  HGB 14.1 13.5 13.8  HCT 42.3 40.0 40.4  MCV 100.7* 99.5 98.8  MCH 33.6 33.6 33.7  MCHC 33.3 33.8 34.2  RDW 12.8 12.7 12.8  PLT 186 190 197    Cardiac EnzymesNo results for input(s): TROPONINI in the last 168 hours. No results for input(s): TROPIPOC in the last 168 hours.   BNP Recent Labs  Lab 07/03/24 1620 07/04/24 0317  PROBNP 1,802.0* 1,527.0*     DDimer No results for input(s): DDIMER in the last 168 hours.   Radiology    ECHOCARDIOGRAM COMPLETE Result Date: 07/04/2024    ECHOCARDIOGRAM REPORT   Patient Name:   Ronnie Hernandez Date of Exam: 07/04/2024 Medical Rec #:  [id number]         Height:       66.0 in Accession #:    [id number]        Weight:       445.0 lb Date of Birth:  [birth year]        BSA:          2.809 m Patient Age:    63 years          BP:           91/53 mmHg  Patient Gender: M                 HR:           138 bpm. Exam Location:  Inpatient Procedure: 2D Echo, Cardiac Doppler, Color Doppler and Intracardiac            Opacification Agent (Both Spectral and Color Flow Doppler were            utilized during procedure). Indications:    CHF - Acute Diastolic  History:        Patient has prior history of Echocardiogram examinations, most                 recent 12/21/2021. CHF, Signs/Symptoms:Shortness of Breath, Chest                 Pain and Edema; Risk Factors:Diabetes, Dyslipidemia,                 Hypertension and Former Smoker.  Sonographer:    Juliene Rucks Referring Phys: [id number] EVA KATHEE PORE  Sonographer Comments: Technically difficult study due to poor echo windows, no subcostal window and patient is obese. Image acquisition challenging due to patient body habitus. IMPRESSIONS  1. Left ventricular ejection fraction, by estimation, is >75%. The left ventricle has hyperdynamic function. The left ventricle has no regional wall motion abnormalities. Left ventricular diastolic parameters are indeterminate.  2. Right ventricular systolic function is normal. The right ventricular size is normal.  3. The mitral valve is normal in structure. No evidence of mitral valve regurgitation. No evidence of mitral stenosis.  4. The aortic valve is tricuspid. Aortic valve regurgitation is not visualized. No aortic stenosis is present. FINDINGS  Left Ventricle: Left ventricular ejection fraction, by estimation, is >75%. The left ventricle has hyperdynamic function. The left ventricle has no regional wall motion abnormalities. Definity  contrast agent was given IV to  delineate the left ventricular endocardial borders. The left ventricular internal cavity size was normal in size. There is no left ventricular hypertrophy. Left ventricular diastolic parameters are indeterminate. Right Ventricle: The right ventricular size is normal. Right ventricular systolic function is normal. Left  Atrium: Left atrial size was normal in size. Right Atrium: Right atrial size was normal in size. Pericardium: There is no evidence of pericardial effusion. Mitral Valve: The mitral valve is normal in structure. No evidence of mitral valve regurgitation. No evidence of mitral valve stenosis. Tricuspid Valve: The tricuspid valve is not well visualized. Tricuspid valve regurgitation is trivial. No evidence of tricuspid stenosis. Aortic Valve: The aortic valve is tricuspid. Aortic valve regurgitation is not visualized. No aortic stenosis is present. Pulmonic Valve: The pulmonic valve was not well visualized. Pulmonic valve regurgitation is not visualized. No evidence of pulmonic stenosis. Aorta: The aortic root is normal in size and structure. Venous: The inferior vena cava was not well visualized. IAS/Shunts: The interatrial septum was not well visualized.  LEFT VENTRICLE PLAX 2D LVIDd:         4.00 cm LVIDs:         3.40 cm LV PW:         1.10 cm LV IVS:        1.10 cm LVOT diam:     2.20 cm LVOT Area:     3.80 cm  LV Volumes (MOD) LV vol d, MOD A4C: 117.0 ml LV vol s, MOD A4C: 76.0 ml LV SV MOD A4C:     117.0 ml RIGHT VENTRICLE TAPSE (M-mode): 1.0 cm LEFT ATRIUM         Index LA diam:    2.90 cm 1.03 cm/m   AORTA Ao Root diam: 3.40 cm  SHUNTS Systemic Diam: 2.20 cm Redell Shallow MD Electronically signed by Redell Shallow MD Signature Date/Time: 07/04/2024/8:55:27 AM    Final     Cardiac Studies   Echocardiogram 07/04/2024:    1. Left ventricular ejection fraction, by estimation, is >75%. The left  ventricle has hyperdynamic function. The left ventricle has no regional  wall motion abnormalities. Left ventricular diastolic parameters are  indeterminate.   2. Right ventricular systolic function is normal. The right ventricular  size is normal.   3. The mitral valve is normal in structure. No evidence of mitral valve  regurgitation. No evidence of mitral stenosis.   4. The aortic valve is tricuspid.  Aortic valve regurgitation is not  visualized. No aortic stenosis is present.   Patient Profile     This is a 64 year old male with past medical history of chronic diastolic heart failure, morbid obesity, hypertension, RA, type 2 diabetes, gout, CKD from cardiology was consulted for chest pain and concern for heart failure exacerbation.   Assessment & Plan   Acute on chronic diastolic heart failure with difficult volume assessment due to body habitus-likely multifactorial: Suspected undiagnosed OSA and has a high salt diet.  Received Lasix  80 mg IV BID and a dose of Diamox .  Weight and I/os inaccurate.  Echo shows hyperdynamic function but was performed while patient was in SVT. Appears euvolemic today but volume status is very difficult to assess and creatinine is slightly elevated.  Continue salt restriction. Will see if he can undergo RHC today. May consider Cardiomems as an outpatient due to difficult volume assessment. NSTEMI, likely demand ischemia in the setting of heart failure-troponin flat x 2.  Echocardiogram as above.  Can consider outpatient stress testing. Copntinue aspirin . Paroxysmal  SVT  EKG labeled AF but looks more like SVT from the ED and no fibrillation waves on telemetry during conversion to NSR. Symptoms of chest pain and hot flashes are likely due to paroxysmal SVT.  Continues to have paroxysms during hospitalization. Strongly advised to decrease salt intake, lose weight and get a sleep study as he likely has undiagnosed sleep apnea. TSH elevated with normal free T4. Had an episode last night but had some hypotension which I suspect is from too much beta blockers. Will ask our EP colleagues to evaluate and assist with management.  AKI on CKD improving Suspected undiagnosed sleep apnea recommended outpatient sleep study Hypomagnesemia recheck Hyponatremia-  likely from volume overload.  Improving with diuresis Morbid obesity Hyperlipidemia Type 2 diabetes with  hyperglycemia Prolonged QT avoid QT prolonging agents and maintain K greater than 4.0 and mag greater than 2    Informed Consent   Shared Decision Making/Informed Consent The risks, including but not limited to, [bleeding or vascular complications (1 in 500), pneumothorax (1 in 1600), arrhythmia (1 in 1000) and death (1 in 5000)], benefits (diagnostic support and/or management of heart failure, pulmonary hypertension) and alternatives of a right heart catheterization were discussed in detail with Mr. Lust and he is willing to proceed.        For questions or updates, please contact [facility] Please consult [email or web address] for contact info under        Signed, Emeline Calender, DO 07/06/2024, 7:40 AM    "

## 2024-07-06 NOTE — Consult Note (Addendum)
 "   ELECTROPHYSIOLOGY CONSULT NOTE    Patient ID: Ronnie Hernandez MRN: [id number], DOB/AGE: 65-Dec-1962 65 y.o.  Admit date: 07/03/2024 Date of Consult: 07/06/2024  Primary Physician: Gaither Gear, NP-C Primary Cardiologist: None  Electrophysiologist: New   Referring Provider: Dr. Adair  Patient Profile: Ronnie Hernandez is a 64 y.o. male with a history of chronic diastolic CHF, morbid obesity, RA, Dm2, gout, CKD, and hypertension who is being seen today for the evaluation of SVT at the request of Dr. Kriste.  HPI:  Ronnie Hernandez is a 64 y.o. male admitted for acute diastolic CHF. Course complicated by paroxysmal SVT and chest discomfort, difficult to manage and EP asked to see for recommendations.   Sent to ED from [facility] with tachycardia and chest pain; HAs also been having SOB (acute worsening of chronic issue) and has been unable to lie flat. BMP revealed hyponatremia 132, glucose 356, creatinine 1.13, GFR >60.  proBNP 1802.  High sensitive troponin 105 X1.  CTA chest showed no acute findings.  Cardiology consulted for chest pain and CHF  Echo showed normal EF and chest pain noted to correlate with AT/SVT.   Transiently on diltiazem  drip with improvement -> Transitioned to po metoprolol  but has continued to have paroxysms as well as hypotension. EP asked to see for further recommendations.   Pt at rest currently. Primary complaint is back pain, but also having chest heaviness with active SVT/AT in 120s currently.  States has had this symptoms before but worse in the last week.  Not very active. Goes to [facility] several times a week.   Labs Potassium4.3 (01/16 0415) Magnesium   2.1 (01/16 0415) Creatinine, ser  1.51* (01/16 0415) PLT  197 (01/15 0951) HGB  13.8 (01/15 0951) WBC 8.4 (01/15 0951)  .    Past Medical History:  Diagnosis Date   Acquired hypothyroidism    Arthritis    knees, hands (04/19/2017)   Cellulitis 05/2020   bilateral legs    Childhood asthma    Chronic diastolic heart failure (HCC)    grade 3 diastolic dysfunction on echo from 08/06/16   Diabetes type 2, uncontrolled    Gout    have had it in both feet, both hands, right shoulder, back (04/19/2017)   Hyperlipidemia    Hypertension    Morbidly obese (HCC)    Pulmonary embolism (HCC) 2017?     Surgical History:  Past Surgical History:  Procedure Laterality Date   COLONOSCOPY W/ BIOPSIES AND POLYPECTOMY  07/2011   thelbert 07/27/2011     Medications Prior to Admission  Medication Sig Dispense Refill Last Dose/Taking   acetaminophen  (TYLENOL ) 500 MG tablet Take 500-1,000 mg by mouth every 6 (six) hours as needed for mild pain (pain score 1-3) or moderate pain (pain score 4-6).   07/03/2024   albuterol  (VENTOLIN  HFA) 108 (90 Base) MCG/ACT inhaler Inhale 1-2 puffs into the lungs every 6 (six) hours as needed for wheezing or shortness of breath.   Unknown   allopurinol  (ZYLOPRIM ) 300 MG tablet Take 1 tablet (300 mg total) by mouth 2 (two) times daily. (Patient taking differently: Take 600 mg by mouth daily.) 180 tablet 1 Past Week   benzonatate  (TESSALON ) 100 MG capsule Take 100 mg by mouth 3 (three) times daily as needed for cough.   Unknown   canagliflozin (INVOKANA) 300 MG TABS tablet Take 300 mg by mouth daily.   Past Week   Cholecalciferol (VITAMIN D3) 50 MCG (  2000 UT) CAPS Take 1,000 Units by mouth daily.   Past Week   colchicine  0.6 MG tablet Take 1 tablet (0.6 mg total) by mouth daily. (Patient taking differently: Take 0.6-1.2 mg by mouth See admin instructions. Take 1.2mg  (2 tablets) by mouth at onset of gout flare and 0.6mg  (1 tablet) one hour later.) 30 tablet 0 Past Week   cyanocobalamin (VITAMIN B12) 500 MCG tablet Take 500 mcg by mouth daily.   Past Week   fenofibrate  160 MG tablet Take 160 mg by mouth every evening.   Past Week   furosemide  (LASIX ) 40 MG tablet Take one tablet by mouth once daily 90 tablet 0 Past Week   guaiFENesin  (ROBITUSSIN) 100  MG/5ML liquid Take 5 mLs by mouth every 6 (six) hours as needed for cough or to loosen phlegm.   Unknown   levothyroxine  (SYNTHROID ) 150 MCG tablet Take 150 mcg by mouth every other day. Alternate with 175mcg dose   Past Week   levothyroxine  (SYNTHROID ) 175 MCG tablet Take 175 mcg by mouth every other day. Alternate with 150mcg dose.   07/03/2024   losartan  (COZAAR ) 25 MG tablet Take 1 tablet (25 mg total) by mouth daily. (Patient taking differently: Take 25 mg by mouth every evening.)   Past Week   metFORMIN  (GLUCOPHAGE -XR) 500 MG 24 hr tablet Take 1,000 mg by mouth 2 (two) times daily.   Past Week   Miconazole Nitrate 2 % SOLN Apply 1 spray topically 2 (two) times daily.   Past Week   nystatin  powder Apply 1 Application topically 3 (three) times daily.   Past Week   rosuvastatin  (CRESTOR ) 40 MG tablet Take 40 mg by mouth at bedtime.   Past Week   Semaglutide (RYBELSUS) 3 MG TABS Take 3 mg by mouth daily.   Past Week   zinc oxide (BALMEX) 11.3 % CREA cream Apply 1 Application topically daily as needed (Rash).   Unknown    Inpatient Medications:   aspirin  EC  81 mg Oral Daily   atorvastatin   80 mg Oral Daily   enoxaparin  (LOVENOX ) injection  100 mg Subcutaneous Q24H   fenofibrate   160 mg Oral Daily   insulin  aspart  0-15 Units Subcutaneous TID WC   insulin  aspart  0-5 Units Subcutaneous QHS   insulin  aspart  5 Units Subcutaneous TID WC   insulin  glargine  30 Units Subcutaneous Daily   levothyroxine   150 mcg Oral QODAY   And   levothyroxine   175 mcg Oral QODAY   metoprolol  tartrate  50 mg Oral BID   pantoprazole  (PROTONIX ) IV  40 mg Intravenous Q24H    Allergies: Allergies[1]  Family History  Problem Relation Age of Onset   Colon cancer Maternal Grandfather 65   Heart attack Father    Stomach cancer Maternal Aunt 61     Physical Exam: Vitals:   07/05/24 2101 07/05/24 2258 07/06/24 0336 07/06/24 0721  BP: (!) 91/50 123/87 139/71 116/63  Pulse:  72  67  Resp: 15 20  20   Temp:   97.9 F (36.6 C) (!) 97.5 F (36.4 C) 98.3 F (36.8 C)  TempSrc:  Oral Oral Oral  SpO2:  98% 100% 100%  Weight:   (!) 203.7 kg   Height:        GEN- NAD, A&O x 3, normal affect HEENT: Normocephalic, atraumatic Lungs- Normal effort.  Heart- Tachy and regular rate and rhythm GI- Obesie Extremities- Markedly obese with chronic edema and venous stasis changes BLE.  Radiology/Studies: ECHOCARDIOGRAM COMPLETE Result Date: 07/04/2024    ECHOCARDIOGRAM REPORT   Patient Name:   Ronnie Hernandez Date of Exam: 07/04/2024 Medical Rec #:  [id number]         Height:       66.0 in Accession #:    [id number]        Weight:       445.0 lb Date of Birth:  Mar 02, 1961        BSA:          2.809 m Patient Age:    63 years          BP:           91/53 mmHg Patient Gender: M                 HR:           138 bpm. Exam Location:  Inpatient Procedure: 2D Echo, Cardiac Doppler, Color Doppler and Intracardiac            Opacification Agent (Both Spectral and Color Flow Doppler were            utilized during procedure). Indications:    CHF - Acute Diastolic  History:        Patient has prior history of Echocardiogram examinations, most                 recent 12/21/2021. CHF, Signs/Symptoms:Shortness of Breath, Chest                 Pain and Edema; Risk Factors:Diabetes, Dyslipidemia,                 Hypertension and Former Smoker.  Sonographer:    Juliene Rucks Referring Phys: [id number] EVA KATHEE PORE  Sonographer Comments: Technically difficult study due to poor echo windows, no subcostal window and patient is obese. Image acquisition challenging due to patient body habitus. IMPRESSIONS  1. Left ventricular ejection fraction, by estimation, is >75%. The left ventricle has hyperdynamic function. The left ventricle has no regional wall motion abnormalities. Left ventricular diastolic parameters are indeterminate.  2. Right ventricular systolic function is normal. The right ventricular size is normal.  3. The mitral valve is  normal in structure. No evidence of mitral valve regurgitation. No evidence of mitral stenosis.  4. The aortic valve is tricuspid. Aortic valve regurgitation is not visualized. No aortic stenosis is present. FINDINGS  Left Ventricle: Left ventricular ejection fraction, by estimation, is >75%. The left ventricle has hyperdynamic function. The left ventricle has no regional wall motion abnormalities. Definity  contrast agent was given IV to delineate the left ventricular endocardial borders. The left ventricular internal cavity size was normal in size. There is no left ventricular hypertrophy. Left ventricular diastolic parameters are indeterminate. Right Ventricle: The right ventricular size is normal. Right ventricular systolic function is normal. Left Atrium: Left atrial size was normal in size. Right Atrium: Right atrial size was normal in size. Pericardium: There is no evidence of pericardial effusion. Mitral Valve: The mitral valve is normal in structure. No evidence of mitral valve regurgitation. No evidence of mitral valve stenosis. Tricuspid Valve: The tricuspid valve is not well visualized. Tricuspid valve regurgitation is trivial. No evidence of tricuspid stenosis. Aortic Valve: The aortic valve is tricuspid. Aortic valve regurgitation is not visualized. No aortic stenosis is present. Pulmonic Valve: The pulmonic valve was not well visualized. Pulmonic valve regurgitation is not visualized. No evidence of pulmonic stenosis. Aorta: The aortic root is normal  in size and structure. Venous: The inferior vena cava was not well visualized. IAS/Shunts: The interatrial septum was not well visualized.  LEFT VENTRICLE PLAX 2D LVIDd:         4.00 cm LVIDs:         3.40 cm LV PW:         1.10 cm LV IVS:        1.10 cm LVOT diam:     2.20 cm LVOT Area:     3.80 cm  LV Volumes (MOD) LV vol d, MOD A4C: 117.0 ml LV vol s, MOD A4C: 76.0 ml LV SV MOD A4C:     117.0 ml RIGHT VENTRICLE TAPSE (M-mode): 1.0 cm LEFT ATRIUM          Index LA diam:    2.90 cm 1.03 cm/m   AORTA Ao Root diam: 3.40 cm  SHUNTS Systemic Diam: 2.20 cm Redell Shallow MD Electronically signed by Redell Shallow MD Signature Date/Time: 07/04/2024/8:55:27 AM    Final    CT Angio Chest PE W and/or Wo Contrast Result Date: 07/03/2024 CLINICAL DATA:  Pulmonary embolism (PE) suspected, high prob, chest pain, short of breath EXAM: CT ANGIOGRAPHY CHEST WITH CONTRAST TECHNIQUE: Multidetector CT imaging of the chest was performed using the standard protocol during bolus administration of intravenous contrast. Multiplanar CT image reconstructions and MIPs were obtained to evaluate the vascular anatomy. RADIATION DOSE REDUCTION: This exam was performed according to the departmental dose-optimization program which includes automated exposure control, adjustment of the mA and/or kV according to patient size and/or use of iterative reconstruction technique. CONTRAST:  OMNIPAQUE  IOHEXOL  350 MG/ML SOLN COMPARISON:  03/03/2025, 05/03/2024 FINDINGS: Cardiovascular: This is a technically adequate evaluation of the pulmonary vasculature. No filling defects or pulmonary emboli. The heart is unremarkable without pericardial effusion. No evidence of thoracic aortic aneurysm or dissection. Atherosclerosis of the thoracic aorta. Mediastinum/Nodes: No enlarged mediastinal, hilar, or axillary lymph nodes. Thyroid gland, trachea, and esophagus demonstrate no significant findings. Lungs/Pleura: Minimal hypoventilatory changes at the lung bases. No airspace disease, effusion, or pneumothorax. Central airways are patent. Upper Abdomen: No acute abnormality. Musculoskeletal: No acute or destructive bony abnormalities. Reconstructed images demonstrate no additional findings. Review of the MIP images confirms the above findings. IMPRESSION: 1. No evidence of pulmonary embolus. 2. No acute intrathoracic process. 3.  Aortic Atherosclerosis (ICD10-I70.0). Electronically Signed   By: Ozell Daring  M.D.   On: 07/03/2024 18:23   DG Chest Port 1 View Result Date: 07/03/2024 CLINICAL DATA:  Chest pain, short of breath EXAM: PORTABLE CHEST 1 VIEW COMPARISON:  05/03/2024 FINDINGS: The heart size and mediastinal contours are within normal limits. Both lungs are clear. The visualized skeletal structures are unremarkable. IMPRESSION: No active disease. Electronically Signed   By: Ozell Daring M.D.   On: 07/03/2024 15:45    EKG: on arrival 1/13 showed NSR in 80s (personally reviewed) Subsequent EKG 1/14 showed AT/SVT in 120s  TELEMETRY: Currently in SVT/AT in 120s, previously was in NSR 70s (personally reviewed)  Assessment/Plan:  SVT Not well controlled, and management complicated by significant co-morbidities.  Change to toprol  50 mg daily to try and decrease BP response Start diltiazem  240 mg daily with 30 mg short acting now as he is out of rhythm.  Flecainide would not be ideal with long 1st degree AV block at baseline and HFpEF symptoms on arrival, same for Multaq.  If persists throughout the morning could given adenosine.   Acute on chronic diastolic CHF Volume status  difficult to assess.  General cardiology following  Echo shows hyperdynamic function  Super morbid obesity Body mass index is 72.47 kg/m.  Not candidate for EP procedures  Sleep disorder breathing HLD DM2 Per primary  Dr. Inocencio has seen.   For questions or updates, please contact [facility] Please consult [email or web address] for contact info under     Signed, Ozell Prentice Passey, PA-C  07/06/2024, 8:45 AM       I have seen and examined this patient with Jodie Passey  Agree with above, note added to reflect my findings.  Patient with a past history as above.  He presented to the hospital with tachycardia and chest pain.  He was also having worsening shortness of breath.  Labs showed hyponatremia, hyperglycemia and elevated BNP.  He has been undergoing diuresis since he has been in the  hospital.  He has been in and out of SVT.  SVT episodes have continued after stopping IV diltiazem .  He feels palpitations and mild shortness of breath, but is otherwise without major complaint.  GEN: No acute distress.  Morbidly obese Neck: No JVD Cardiac: Tachycardic, regular Respiratory: normal work of breathing GI: Obese Neuro:  Nonfocal  Skin: warm and dry Psych: Normal affect    SVT: Not well-controlled.  Potentially related to reentry, though difficult to tell.  Jennalynn Rivard change to Toprol -XL 50 mg and start diltiazem  240 mg daily.  Neliah Cuyler give 30 mg of diltiazem  now to see if it Joniyah Mallinger convert him to sinus rhythm.  If he continues to have episodes of SVT, would maximize his diltiazem .  Flecainide would be a reasonable option as well if SVT continues. Acute on chronic diastolic heart failure: Volume status difficult to assess.  Plan per primary cardiology. Super morbid obesity: Not a candidate for EP procedures  Amberlee Garvey M. Rachele Lamaster MD 07/06/2024 10:59 AM     [1]  Allergies Allergen Reactions   Probenecid Diarrhea   Uloric  [Febuxostat ] Nausea Only and Other (See Comments)    Made the skin on feet and legs hurt   "

## 2024-07-07 ENCOUNTER — Encounter (HOSPITAL_COMMUNITY): Payer: Self-pay | Admitting: Cardiovascular Disease

## 2024-07-07 DIAGNOSIS — I5033 Acute on chronic diastolic (congestive) heart failure: Secondary | ICD-10-CM | POA: Diagnosis not present

## 2024-07-07 DIAGNOSIS — I471 Supraventricular tachycardia, unspecified: Secondary | ICD-10-CM | POA: Diagnosis not present

## 2024-07-07 LAB — BASIC METABOLIC PANEL WITH GFR
Anion gap: 9 (ref 5–15)
BUN: 25 mg/dL — ABNORMAL HIGH (ref 8–23)
CO2: 28 mmol/L (ref 22–32)
Calcium: 8.7 mg/dL — ABNORMAL LOW (ref 8.9–10.3)
Chloride: 93 mmol/L — ABNORMAL LOW (ref 98–111)
Creatinine, Ser: 1.22 mg/dL (ref 0.61–1.24)
GFR, Estimated: >60 mL/min
Glucose, Bld: 263 mg/dL — ABNORMAL HIGH (ref 70–99)
Potassium: 4.2 mmol/L (ref 3.5–5.1)
Sodium: 130 mmol/L — ABNORMAL LOW (ref 135–145)

## 2024-07-07 LAB — GLUCOSE, CAPILLARY
Glucose-Capillary: 228 mg/dL — ABNORMAL HIGH (ref 70–99)
Glucose-Capillary: 232 mg/dL — ABNORMAL HIGH (ref 70–99)
Glucose-Capillary: 203 mg/dL — ABNORMAL HIGH (ref 70–99)
Glucose-Capillary: 212 mg/dL — ABNORMAL HIGH (ref 70–99)

## 2024-07-07 MED ORDER — TORSEMIDE 20 MG PO TABS
20.0000 mg | ORAL_TABLET | Freq: Every day | ORAL | Status: DC
  Administered 2024-07-07: 20 mg via ORAL
  Filled 2024-07-07: qty 1

## 2024-07-07 MED ORDER — DAPAGLIFLOZIN PROPANEDIOL 10 MG PO TABS
10.0000 mg | ORAL_TABLET | Freq: Every day | ORAL | Status: DC
  Administered 2024-07-07 – 2024-07-10 (×4): 10 mg via ORAL
  Filled 2024-07-07 (×4): qty 1

## 2024-07-07 MED ORDER — INSULIN GLARGINE 100 UNIT/ML ~~LOC~~ SOLN
45.0000 [IU] | Freq: Every day | SUBCUTANEOUS | Status: DC
  Administered 2024-07-07 – 2024-07-10 (×4): 45 [IU] via SUBCUTANEOUS
  Filled 2024-07-07 (×4): qty 0.45

## 2024-07-07 NOTE — Progress Notes (Addendum)
 "   Progress Note  Patient Name: Ronnie Hernandez Date of Encounter: 07/07/2024  Primary Cardiologist: None  Subjective   No symptoms.  Inpatient Medications    Scheduled Meds:  aspirin  EC  81 mg Oral Daily   atorvastatin   80 mg Oral Daily   diltiazem   240 mg Oral Daily   enoxaparin  (LOVENOX ) injection  100 mg Subcutaneous Q24H   fenofibrate   160 mg Oral Daily   insulin  aspart  0-15 Units Subcutaneous TID WC   insulin  aspart  0-5 Units Subcutaneous QHS   insulin  aspart  6 Units Subcutaneous TID WC   insulin  glargine  45 Units Subcutaneous Daily   levothyroxine   150 mcg Oral QODAY   And   levothyroxine   175 mcg Oral QODAY   metoprolol  succinate  50 mg Oral Daily   pantoprazole   40 mg Oral QHS   sodium chloride  flush  3 mL Intravenous Q12H   Continuous Infusions:  sodium chloride      PRN Meds: sodium chloride , acetaminophen  **OR** acetaminophen , alum & mag hydroxide-simeth, melatonin, methocarbamol , sodium chloride  flush   Vital Signs    Vitals:   07/06/24 1948 07/06/24 2359 07/07/24 0444 07/07/24 0812  BP: 123/67 119/63 (!) 140/59 (!) 141/56  Pulse: 65 68 73 70  Resp: 18  (!) 21 19  Temp: 97.6 F (36.4 C) 97.8 F (36.6 C) 97.6 F (36.4 C) 97.6 F (36.4 C)  TempSrc: Oral Oral Oral Oral  SpO2: 100% 98% 98% 99%  Weight:   (!) 194.6 kg   Height:        Intake/Output Summary (Last 24 hours) at 07/07/2024 0928 Last data filed at 07/07/2024 9374 Gross per 24 hour  Intake 240 ml  Output 1275 ml  Net -1035 ml   Filed Weights   07/05/24 0314 07/06/24 0336 07/07/24 0444  Weight: (!) 203.2 kg (!) 203.7 kg (!) 194.6 kg    Telemetry     Personally reviewed.  No arrhythmias in the last 24 hours.  ECG    Not performed today.  Physical Exam   GEN: No acute distress.   Neck: Unable to examine due to body. Cardiac: RRR, no murmur, rub, or gallop.  Respiratory: Nonlabored. Clear to auscultation bilaterally. GI: Soft, nontender, bowel sounds present. MS: Legs  are wrapped.  No deformity. Neuro:  Nonfocal. Psych: Alert and oriented x 3. Normal affect.  Labs    Chemistry Recent Labs  Lab 07/04/24 0317 07/05/24 0307 07/05/24 0951 07/06/24 0415 07/07/24 0241  NA 133* 131*  --  131* 130*  K 4.1 4.2  --  4.3 4.2  CL 95* 94*  --  93* 93*  CO2 26 26  --  29 28  GLUCOSE 298* 303*  --  243* 263*  BUN 16 23  --  27* 25*  CREATININE 1.07 1.59* 1.60* 1.51* 1.22  CALCIUM  9.0 8.7*  --  8.7* 8.7*  PROT 7.2 6.9  --   --   --   ALBUMIN 3.2* 3.0*  --   --   --   AST 56* 47*  --   --   --   ALT 45* 39  --   --   --   ALKPHOS 125 123  --   --   --   BILITOT 0.6 0.6  --   --   --   GFRNONAA >60 48* 48* 51* >60  ANIONGAP 11 11  --  10 9     Hematology Recent Labs  Lab 07/04/24 0317 07/05/24 0307 07/05/24 0951  WBC 8.9 9.9 8.4  RBC 4.20* 4.02* 4.09*  HGB 14.1 13.5 13.8  HCT 42.3 40.0 40.4  MCV 100.7* 99.5 98.8  MCH 33.6 33.6 33.7  MCHC 33.3 33.8 34.2  RDW 12.8 12.7 12.8  PLT 186 190 197    Cardiac EnzymesNo results for input(s): TROPONINIHS in the last 720 hours.  BNP Recent Labs  Lab 07/03/24 1620 07/04/24 0317  PROBNP 1,802.0* 1,527.0*     DDimerNo results for input(s): DDIMER in the last 168 hours.   Radiology    CARDIAC CATHETERIZATION Result Date: 07/06/2024 [facility] DATA: RA mean 11 mmHg RV 32/12 mmHg PA 30/13 mean 20 mmHg PCWP 15 mmHg CO/CI 5.9/2.08 Mild elevation in RA pressure. Otherwise intracardiac pressures within normal limits. Cardiac output in normal range. Doubt cardiac index accurate in setting of BSA calculation with super morbid obesity.    Assessment & Plan   Acute on chronic diastolic heart failure - Presented with worsening DOE and leg swelling. - proBNP elevated, 1527.  Chest x-ray normal. - Likely triggered by SVT. - Received IV Lasix  80 mg twice daily on admission but due to AKI, Lasix  discontinued. - [facility] on 07/06/2024 showed RA mean 11 mmHg, RV 32/12, PA 30/13, mean 20 mmHg, PCWP 15 mmHg.  There was  mild elevation of RA pressure otherwise normal intracardiac pressures. - On p.o. Lasix  40 mg once daily at home.  Switch to p.o. torsemide  10 mg once daily. - Resume losartan  25 mg once daily, home med. - Resume canagliflozin 300 mg once daily, home med.  SVT - EKG on admission showed SVT.  Likely etiology for ADHF. - After adding diltiazem , no more episodes of tachycardia on telemetry. - Continue metoprolol  succinate 50 mg once daily and diltiazem  240 mg once daily.  [facility] will sign off.   Medication Recommendations: Continue above Other recommendations (labs, testing, etc): None none Follow up as an outpatient: Cardiology follow-up in 1 month on discharge   Signed, Joron Velis P Arfa Lamarca, MD  07/07/2024, 9:28 AM    "

## 2024-07-07 NOTE — Plan of Care (Signed)
   Problem: Education: Goal: Ability to describe self-care measures that may prevent or decrease complications (Diabetes Survival Skills Education) will improve Outcome: Progressing Goal: Individualized Educational Video(s) Outcome: Progressing   Problem: Coping: Goal: Ability to adjust to condition or change in health will improve Outcome: Progressing

## 2024-07-07 NOTE — Progress Notes (Signed)
 " PROGRESS NOTE    Ronnie Hernandez  FMW:[id number] DOB: 1960/07/21 DOA: 07/03/2024 PCP: Gaither Gear, NP-C   chronically ill 63/M with diastolic CHF, morbid obesity, BMI >70, type 2 diabetes, rheumatoid arthritis, history of PE, gout, CKD 3 presented to the ED with chest pain and shortness of breath, intermittent episodes of chest pain since this weekend, he has chronic dyspnea and orthopnea. In the ED sodium 132, creatinine 1.1, proBNP 06/28/2000, troponin 105, CTA chest no acute findings Admitted, started on diuretics - Intermittent episodes of SVT, started on metoprolol  and Cardizem  - Seen by EP, increase Toprol , Cardizem  added - RHC 1/16, RA mean 11, RV 32/12, PA 30/13, wedge 15  Subjective: - Chest pain again last night, tachycardic, denies much dyspnea, intermittent SVT  Assessment and Plan:  Acute on chronic diastolic CHF - Echo with EF greater than 75%, indeterminate diastolic parameters, limited by obesity - 4 L negative, volume status difficult to assess  - RHC 1/16, RA mean 11, RV 32/12, PA 30/13, wedge 15 - continue metoprolol , resume farxiga  on canagliflozin at baseline - starting Po torsemide  today - Increase activity, PT OT, requiring 2+ assistance, TOC following, limited SNF options due to weight  Atypical chest pain - Etiology is unclear, suspected to be SVT related - No ACS at this time, CTA chest negative for PE - Cards following, echo without wall motion abnormality - Follow-up with cardiology, no plans for ischemic workup at this time  SVT - Required Cardizem  gtt. on admission, now off - Cards following, started on Cardizem  p.o., continue metoprolol , dose increased - Heart rate significantly improved  Uncontrolled type 2 diabetes mellitus with hyperglycemia - Metformin  on hold - CBGs remain elevated, A1c is 10.6 - Increase Semglee , and meal coverage diabetes coordinator and dietitian consult - Care was discussed with PCP Wanda Gaither, [facility], plan to start GLP-1 agonist after discharge, they think he will be unable to manage insulin  at home  Morbid obesity, BMI > 72 Suspected OSA OHS -Has not been formally diagnosed with OSA, will need sleep study as outpatient - Monitor nocturnal sats  Hypothyroidism Continue Synthroid , TSH mildly elevated, recommend repeat labs in 6 weeks   DVT prophylaxis:  Lovenox  Code Status: Full code Family Communication: None present Disposition Plan: Home versus short-term rehab, TBD  Consultants:    Procedures:   Antimicrobials:    Objective: Vitals:   07/06/24 1948 07/06/24 2359 07/07/24 0444 07/07/24 0812  BP: 123/67 119/63 (!) 140/59 (!) 141/56  Pulse: 65 68 73 70  Resp: 18  (!) 21 19  Temp: 97.6 F (36.4 C) 97.8 F (36.6 C) 97.6 F (36.4 C) 97.6 F (36.4 C)  TempSrc: Oral Oral Oral Oral  SpO2: 100% 98% 98% 99%  Weight:   (!) 194.6 kg   Height:        Intake/Output Summary (Last 24 hours) at 07/07/2024 1124 Last data filed at 07/07/2024 9374 Gross per 24 hour  Intake --  Output 1275 ml  Net -1275 ml   Filed Weights   07/05/24 0314 07/06/24 0336 07/07/24 0444  Weight: (!) 203.2 kg (!) 203.7 kg (!) 194.6 kg    Examination:  General exam: Morbidly obese, chronically ill-appearing, AAO x 3, no distress HEENT: Neck obese unable to assess JVD CVS: S1-S2, regular rhythm Lungs: Distant breath sounds  Abd: nondistended, soft and nontender.Normal bowel sounds heard. Central nervous system: Alert and oriented. No focal neurological deficits. Extremities: 1+ bilateral edema, venous stasis changes,  now with Unna boots Skin: As above Psychiatry:  Mood & affect appropriate.     Data Reviewed:   CBC: Recent Labs  Lab 07/03/24 1530 07/04/24 0317 07/05/24 0307 07/05/24 0951  WBC 7.5 8.9 9.9 8.4  NEUTROABS  --  5.4  --   --   HGB 14.1 14.1 13.5 13.8  HCT 42.2 42.3 40.0 40.4  MCV 103.2* 100.7* 99.5 98.8  PLT 158 186 190 197   Basic Metabolic Panel: Recent  Labs  Lab 07/03/24 1620 07/03/24 2232 07/04/24 0317 07/05/24 0307 07/05/24 0951 07/06/24 0415 07/07/24 0241  NA 132*  --  133* 131*  --  131* 130*  K 5.0  --  4.1 4.2  --  4.3 4.2  CL 95*  --  95* 94*  --  93* 93*  CO2 28  --  26 26  --  29 28  GLUCOSE 356*  --  298* 303*  --  243* 263*  BUN 15  --  16 23  --  27* 25*  CREATININE 1.13  --  1.07 1.59* 1.60* 1.51* 1.22  CALCIUM  9.1  --  9.0 8.7*  --  8.7* 8.7*  MG  --  1.7 1.6*  --   --  2.1  --   PHOS  --   --  3.3  --   --   --   --    GFR: Estimated Creatinine Clearance: 100.5 mL/min (by C-G formula based on SCr of 1.22 mg/dL). Liver Function Tests: Recent Labs  Lab 07/04/24 0317 07/05/24 0307  AST 56* 47*  ALT 45* 39  ALKPHOS 125 123  BILITOT 0.6 0.6  PROT 7.2 6.9  ALBUMIN 3.2* 3.0*   Recent Labs  Lab 07/04/24 0317  LIPASE 18   No results for input(s): AMMONIA in the last 168 hours. Coagulation Profile: No results for input(s): INR, PROTIME in the last 168 hours. Cardiac Enzymes: No results for input(s): CKTOTAL, CKMB, CKMBINDEX, TROPONINI in the last 168 hours. BNP (last 3 results) Recent Labs    05/03/24 1557 07/03/24 1620 07/04/24 0317  PROBNP 299.0 1,802.0* 1,527.0*   HbA1C: No results for input(s): HGBA1C in the last 72 hours.  CBG: Recent Labs  Lab 07/06/24 0618 07/06/24 1139 07/06/24 1654 07/06/24 2104 07/07/24 0631  GLUCAP 202* 290* 235* 287* 228*   Lipid Profile: No results for input(s): CHOL, HDL, LDLCALC, TRIG, CHOLHDL, LDLDIRECT in the last 72 hours.  Thyroid Function Tests: No results for input(s): TSH, T4TOTAL, FREET4, T3FREE, THYROIDAB in the last 72 hours.  Anemia Panel: No results for input(s): VITAMINB12, FOLATE, FERRITIN, TIBC, IRON, RETICCTPCT in the last 72 hours. Urine analysis:    Component Value Date/Time   COLORURINE YELLOW 12/19/2021 1252   APPEARANCEUR CLEAR 12/19/2021 1252   APPEARANCEUR Clear 12/05/2015 1226    LABSPEC 1.037 (H) 12/19/2021 1252   PHURINE 5.0 12/19/2021 1252   GLUCOSEU >=500 (A) 12/19/2021 1252   HGBUR NEGATIVE 12/19/2021 1252   BILIRUBINUR NEGATIVE 12/19/2021 1252   BILIRUBINUR Negative 12/05/2015 1226   KETONESUR NEGATIVE 12/19/2021 1252   PROTEINUR NEGATIVE 12/19/2021 1252   UROBILINOGEN 0.2 12/28/2013 1117   NITRITE NEGATIVE 12/19/2021 1252   LEUKOCYTESUR NEGATIVE 12/19/2021 1252   Sepsis Labs: @LABRCNTIP (procalcitonin:4,lacticidven:4)  )No results found for this or any previous visit (from the past 240 hours).   Radiology Studies: CARDIAC CATHETERIZATION Result Date: 07/06/2024 RHC DATA: RA mean 11 mmHg RV 32/12 mmHg PA 30/13 mean 20 mmHg PCWP 15 mmHg CO/CI 5.9/2.08 Mild elevation in  RA pressure. Otherwise intracardiac pressures within normal limits. Cardiac output in normal range. Doubt cardiac index accurate in setting of BSA calculation with super morbid obesity.      Scheduled Meds:  aspirin  EC  81 mg Oral Daily   atorvastatin   80 mg Oral Daily   diltiazem   240 mg Oral Daily   enoxaparin  (LOVENOX ) injection  100 mg Subcutaneous Q24H   fenofibrate   160 mg Oral Daily   insulin  aspart  0-15 Units Subcutaneous TID WC   insulin  aspart  0-5 Units Subcutaneous QHS   insulin  aspart  6 Units Subcutaneous TID WC   insulin  glargine  45 Units Subcutaneous Daily   levothyroxine   150 mcg Oral QODAY   And   levothyroxine   175 mcg Oral QODAY   metoprolol  succinate  50 mg Oral Daily   pantoprazole   40 mg Oral QHS   sodium chloride  flush  3 mL Intravenous Q12H   Continuous Infusions:  sodium chloride         LOS: 3 days    Time spent:    Sigurd Pac, MD [facility]   07/07/2024, 11:24 AM    "

## 2024-07-08 DIAGNOSIS — I5033 Acute on chronic diastolic (congestive) heart failure: Secondary | ICD-10-CM | POA: Diagnosis not present

## 2024-07-08 DIAGNOSIS — I471 Supraventricular tachycardia, unspecified: Secondary | ICD-10-CM | POA: Diagnosis not present

## 2024-07-08 LAB — BASIC METABOLIC PANEL WITH GFR
Anion gap: 10 (ref 5–15)
BUN: 28 mg/dL — ABNORMAL HIGH (ref 8–23)
CO2: 28 mmol/L (ref 22–32)
Calcium: 8.7 mg/dL — ABNORMAL LOW (ref 8.9–10.3)
Chloride: 98 mmol/L (ref 98–111)
Creatinine, Ser: 1.49 mg/dL — ABNORMAL HIGH (ref 0.61–1.24)
GFR, Estimated: 52 mL/min — ABNORMAL LOW
Glucose, Bld: 189 mg/dL — ABNORMAL HIGH (ref 70–99)
Potassium: 4.4 mmol/L (ref 3.5–5.1)
Sodium: 136 mmol/L (ref 135–145)

## 2024-07-08 LAB — GLUCOSE, CAPILLARY
Glucose-Capillary: 164 mg/dL — ABNORMAL HIGH (ref 70–99)
Glucose-Capillary: 225 mg/dL — ABNORMAL HIGH (ref 70–99)
Glucose-Capillary: 199 mg/dL — ABNORMAL HIGH (ref 70–99)
Glucose-Capillary: 170 mg/dL — ABNORMAL HIGH (ref 70–99)

## 2024-07-08 MED ORDER — TORSEMIDE 20 MG PO TABS
20.0000 mg | ORAL_TABLET | Freq: Every day | ORAL | Status: DC
  Administered 2024-07-09 – 2024-07-10 (×2): 20 mg via ORAL
  Filled 2024-07-08 (×2): qty 1

## 2024-07-08 MED ORDER — LACTULOSE 10 GM/15ML PO SOLN
20.0000 g | Freq: Two times a day (BID) | ORAL | Status: DC
  Administered 2024-07-08 – 2024-07-09 (×2): 20 g via ORAL
  Filled 2024-07-08 (×4): qty 30

## 2024-07-08 NOTE — Plan of Care (Signed)
   Problem: Education: Goal: Ability to describe self-care measures that may prevent or decrease complications (Diabetes Survival Skills Education) will improve Outcome: Progressing Goal: Individualized Educational Video(s) Outcome: Progressing   Problem: Coping: Goal: Ability to adjust to condition or change in health will improve Outcome: Progressing

## 2024-07-08 NOTE — Progress Notes (Signed)
 " PROGRESS NOTE    Ronnie Hernandez  FMW:[id number] DOB: [birth year] DOA: 07/03/2024 PCP: Gaither Gear, NP-C   chronically ill 63/M with diastolic CHF, morbid obesity, BMI >70, type 2 diabetes, rheumatoid arthritis, history of PE, gout, CKD 3 presented to the ED with chest pain and shortness of breath, intermittent episodes of chest pain since this weekend, he has chronic dyspnea and orthopnea. In the ED sodium 132, creatinine 1.1, proBNP 06/28/2000, troponin 105, CTA chest no acute findings Admitted, started on diuretics - Intermittent episodes of SVT, started on metoprolol  and Cardizem  - Seen by EP, increase Toprol , Cardizem  added - RHC 1/16, RA mean 11, RV 32/12, PA 30/13, wedge 15  Subjective: - Feels fair, breathing better overall, complains of constipation, asking about [facility] helping with rehab placement  Assessment and Plan:  Acute on chronic diastolic CHF - Echo with EF greater than 75%, indeterminate diastolic parameters, limited by obesity - 5.5 L negative, volume status was difficult to assess - RHC 1/16, RA mean 11, RV 32/12, PA 30/13, wedge 15 - continue metoprolol , resume farxiga  on canagliflozin at baseline - Now switched to oral torsemide  - Increase activity, PT OT, requiring 3+ assistance, TOC following, limited SNF options due to weight  Atypical chest pain - Etiology is unclear, suspected to be SVT related - No ACS at this time, CTA chest negative for PE - Cards following, echo without wall motion abnormality - Follow-up with cardiology, no plans for ischemic workup at this time  SVT - Required Cardizem  gtt. on admission, now off - Cards following, started on Cardizem  p.o., continue metoprolol , dose increased - Heart rate significantly improved  Uncontrolled type 2 diabetes mellitus with hyperglycemia - Metformin  on hold - CBGs remain elevated, A1c is 10.6 - Increased Semglee , and meal coverage diabetes coordinator and dietitian consult - Care was discussed  with PCP Wanda Gaither, NP [facility], plan to start GLP-1 agonist after discharge, they think he will be unable to manage insulin  at home  Morbid obesity, BMI > 72 Suspected OSA OHS -Has not been formally diagnosed with OSA, will need sleep study as outpatient - Monitor nocturnal sats  Hypothyroidism Continue Synthroid , TSH mildly elevated, recommend repeat labs in 6 weeks   DVT prophylaxis:  Lovenox  Code Status: Full code Family Communication: None present Disposition Plan: Home versus short-term rehab, TBD  Consultants:    Procedures:   Antimicrobials:    Objective: Vitals:   07/07/24 1948 07/07/24 2326 07/08/24 0320 07/08/24 0802  BP: (!) 121/58 (!) 110/54 124/64 (!) 107/56  Pulse: 65 71  70  Resp: 18 20  15   Temp: 98.1 F (36.7 C) 98.8 F (37.1 C) 98.3 F (36.8 C) (!) 97.4 F (36.3 C)  TempSrc: Oral Oral Oral Oral  SpO2:  97%  99%  Weight:   (!) 202.8 kg   Height:        Intake/Output Summary (Last 24 hours) at 07/08/2024 1032 Last data filed at 07/08/2024 0754 Gross per 24 hour  Intake 1420 ml  Output 2800 ml  Net -1380 ml   Filed Weights   07/06/24 0336 07/07/24 0444 07/08/24 0320  Weight: (!) 203.7 kg (!) 194.6 kg (!) 202.8 kg    Examination:  General exam: Morbidly obese chronically ill-appearing, AAO x 3, no distress  HEENT: Neck obese unable to assess JVD CVS: S1-S2, regular rhythm Lungs: Breath sounds otherwise clear Abd: nondistended, soft and nontender.Normal bowel sounds heard. Central nervous system: Alert and oriented. No focal neurological  deficits. Extremities: Trace edema, venous stasis changes, now with Unna boots Skin: As above Psychiatry:  Mood & affect appropriate.     Data Reviewed:   CBC: Recent Labs  Lab 07/03/24 1530 07/04/24 0317 07/05/24 0307 07/05/24 0951  WBC 7.5 8.9 9.9 8.4  NEUTROABS  --  5.4  --   --   HGB 14.1 14.1 13.5 13.8  HCT 42.2 42.3 40.0 40.4  MCV 103.2* 100.7* 99.5 98.8  PLT 158 186  190 197   Basic Metabolic Panel: Recent Labs  Lab 07/03/24 2232 07/04/24 0317 07/05/24 0307 07/05/24 0951 07/06/24 0415 07/07/24 0241 07/08/24 0308  NA  --  133* 131*  --  131* 130* 136  K  --  4.1 4.2  --  4.3 4.2 4.4  CL  --  95* 94*  --  93* 93* 98  CO2  --  26 26  --  29 28 28   GLUCOSE  --  298* 303*  --  243* 263* 189*  BUN  --  16 23  --  27* 25* 28*  CREATININE  --  1.07 1.59* 1.60* 1.51* 1.22 1.49*  CALCIUM   --  9.0 8.7*  --  8.7* 8.7* 8.7*  MG 1.7 1.6*  --   --  2.1  --   --   PHOS  --  3.3  --   --   --   --   --    GFR: Estimated Creatinine Clearance: 84.6 mL/min (A) (by C-G formula based on SCr of 1.49 mg/dL (H)). Liver Function Tests: Recent Labs  Lab 07/04/24 0317 07/05/24 0307  AST 56* 47*  ALT 45* 39  ALKPHOS 125 123  BILITOT 0.6 0.6  PROT 7.2 6.9  ALBUMIN 3.2* 3.0*   Recent Labs  Lab 07/04/24 0317  LIPASE 18   No results for input(s): AMMONIA in the last 168 hours. Coagulation Profile: No results for input(s): INR, PROTIME in the last 168 hours. Cardiac Enzymes: No results for input(s): CKTOTAL, CKMB, CKMBINDEX, TROPONINI in the last 168 hours. BNP (last 3 results) Recent Labs    05/03/24 1557 07/03/24 1620 07/04/24 0317  PROBNP 299.0 1,802.0* 1,527.0*   HbA1C: No results for input(s): HGBA1C in the last 72 hours.  CBG: Recent Labs  Lab 07/07/24 0631 07/07/24 1159 07/07/24 1701 07/07/24 2047 07/08/24 0551  GLUCAP 228* 232* 203* 212* 164*   Lipid Profile: No results for input(s): CHOL, HDL, LDLCALC, TRIG, CHOLHDL, LDLDIRECT in the last 72 hours.  Thyroid Function Tests: No results for input(s): TSH, T4TOTAL, FREET4, T3FREE, THYROIDAB in the last 72 hours.  Anemia Panel: No results for input(s): VITAMINB12, FOLATE, FERRITIN, TIBC, IRON, RETICCTPCT in the last 72 hours. Urine analysis:    Component Value Date/Time   COLORURINE YELLOW 12/19/2021 1252   APPEARANCEUR CLEAR  12/19/2021 1252   APPEARANCEUR Clear 12/05/2015 1226   LABSPEC 1.037 (H) 12/19/2021 1252   PHURINE 5.0 12/19/2021 1252   GLUCOSEU >=500 (A) 12/19/2021 1252   HGBUR NEGATIVE 12/19/2021 1252   BILIRUBINUR NEGATIVE 12/19/2021 1252   BILIRUBINUR Negative 12/05/2015 1226   KETONESUR NEGATIVE 12/19/2021 1252   PROTEINUR NEGATIVE 12/19/2021 1252   UROBILINOGEN 0.2 12/28/2013 1117   NITRITE NEGATIVE 12/19/2021 1252   LEUKOCYTESUR NEGATIVE 12/19/2021 1252   Sepsis Labs: @LABRCNTIP (procalcitonin:4,lacticidven:4)  )No results found for this or any previous visit (from the past 240 hours).   Radiology Studies: CARDIAC CATHETERIZATION Result Date: 07/06/2024 RHC DATA: RA mean 11 mmHg RV 32/12 mmHg PA 30/13 mean 20  mmHg PCWP 15 mmHg CO/CI 5.9/2.08 Mild elevation in RA pressure. Otherwise intracardiac pressures within normal limits. Cardiac output in normal range. Doubt cardiac index accurate in setting of BSA calculation with super morbid obesity.      Scheduled Meds:  aspirin  EC  81 mg Oral Daily   atorvastatin   80 mg Oral Daily   dapagliflozin  propanediol  10 mg Oral Daily   diltiazem   240 mg Oral Daily   enoxaparin  (LOVENOX ) injection  100 mg Subcutaneous Q24H   fenofibrate   160 mg Oral Daily   insulin  aspart  0-15 Units Subcutaneous TID WC   insulin  aspart  0-5 Units Subcutaneous QHS   insulin  aspart  6 Units Subcutaneous TID WC   insulin  glargine  45 Units Subcutaneous Daily   lactulose   20 g Oral BID   levothyroxine   150 mcg Oral QODAY   And   levothyroxine   175 mcg Oral QODAY   metoprolol  succinate  50 mg Oral Daily   pantoprazole   40 mg Oral QHS   sodium chloride  flush  3 mL Intravenous Q12H   [START ON 07/09/2024] torsemide   20 mg Oral Daily   Continuous Infusions:      LOS: 4 days    Time spent:    Sigurd Pac, MD [facility]   07/08/2024, 10:32 AM    "

## 2024-07-09 DIAGNOSIS — I471 Supraventricular tachycardia, unspecified: Secondary | ICD-10-CM | POA: Diagnosis not present

## 2024-07-09 DIAGNOSIS — I5033 Acute on chronic diastolic (congestive) heart failure: Secondary | ICD-10-CM | POA: Diagnosis not present

## 2024-07-09 LAB — GLUCOSE, CAPILLARY
Glucose-Capillary: 148 mg/dL — ABNORMAL HIGH (ref 70–99)
Glucose-Capillary: 157 mg/dL — ABNORMAL HIGH (ref 70–99)
Glucose-Capillary: 120 mg/dL — ABNORMAL HIGH (ref 70–99)
Glucose-Capillary: 134 mg/dL — ABNORMAL HIGH (ref 70–99)

## 2024-07-09 LAB — POCT I-STAT EG7
Acid-Base Excess: 3 mmol/L — ABNORMAL HIGH (ref 0.0–2.0)
Bicarbonate: 29.7 mmol/L — ABNORMAL HIGH (ref 20.0–28.0)
Calcium, Ion: 1.14 mmol/L — ABNORMAL LOW (ref 1.15–1.40)
HCT: 44 % (ref 39.0–52.0)
Hemoglobin: 15 g/dL (ref 13.0–17.0)
O2 Saturation: 63 %
Potassium: 3.9 mmol/L (ref 3.5–5.1)
Sodium: 136 mmol/L (ref 135–145)
TCO2: 31 mmol/L (ref 22–32)
pCO2, Ven: 51.7 mmHg (ref 44–60)
pH, Ven: 7.368 (ref 7.25–7.43)
pO2, Ven: 34 mmHg (ref 32–45)

## 2024-07-09 LAB — BASIC METABOLIC PANEL WITH GFR
Anion gap: 9 (ref 5–15)
BUN: 26 mg/dL — ABNORMAL HIGH (ref 8–23)
CO2: 27 mmol/L (ref 22–32)
Calcium: 8.8 mg/dL — ABNORMAL LOW (ref 8.9–10.3)
Chloride: 98 mmol/L (ref 98–111)
Creatinine, Ser: 1.36 mg/dL — ABNORMAL HIGH (ref 0.61–1.24)
GFR, Estimated: 58 mL/min — ABNORMAL LOW
Glucose, Bld: 155 mg/dL — ABNORMAL HIGH (ref 70–99)
Potassium: 4.2 mmol/L (ref 3.5–5.1)
Sodium: 135 mmol/L (ref 135–145)

## 2024-07-09 NOTE — Progress Notes (Signed)
 Orthopedic Tech Progress Note Patient Details:  Ronnie Hernandez 06/28/1960 [id number]  Ortho Devices Type of Ortho Device: Radio broadcast assistant Ortho Device/Splint Location: BLE Ortho Device/Splint Interventions: Ordered, Application, Adjustment   Post Interventions Patient Tolerated: Well Instructions Provided: Care of device  [name] 07/09/2024, 11:02 AM

## 2024-07-09 NOTE — TOC Progression Note (Signed)
 Transition of Care [facility]) - Progression Note    Patient Details  Name: KHADIM LUNDBERG MRN: [id number] Date of Birth: 08-Dec-1960  Transition of Care [facility]) CM/SW Contact  Waddell Barnie Rama, RN Phone Number: 07/09/2024, 10:58 AM  Clinical Narrative:    NCM called [facility], did not get an answer.    Expected Discharge Plan: Home w Home Health Services Barriers to Discharge: Continued Medical Work up               Expected Discharge Plan and Services In-house Referral: NA Discharge Planning Services: CM Consult Post Acute Care Choice: Home Health Living arrangements for the past 2 months: Apartment                                       Social Drivers of Health (SDOH) Interventions SDOH Screenings   Food Insecurity: No Food Insecurity (07/04/2024)  Housing: Unknown (07/05/2024)  Transportation Needs: No Transportation Needs (07/04/2024)  Utilities: Not At Risk (07/04/2024)  Alcohol Screen: Low Risk (07/05/2024)  Financial Resource Strain: Low Risk (07/05/2024)  Social Connections: Unknown (07/04/2024)  Tobacco Use: Medium Risk (07/03/2024)    Readmission Risk Interventions     No data to display

## 2024-07-09 NOTE — Progress Notes (Signed)
 " PROGRESS NOTE    Ronnie Hernandez  FMW:[id number] DOB: 11-15-1960 DOA: 07/03/2024 PCP: Gaither Gear, NP-C   chronically ill 63/M with diastolic CHF, morbid obesity, BMI >70, type 2 diabetes, rheumatoid arthritis, history of PE, gout, CKD 3 presented to the ED with chest pain and shortness of breath, intermittent episodes of chest pain since this weekend, he has chronic dyspnea and orthopnea. In the ED sodium 132, creatinine 1.1, proBNP 06/28/2000, troponin 105, CTA chest no acute findings Admitted, started on diuretics - Intermittent episodes of SVT, started on metoprolol  and Cardizem  - Seen by EP, increase Toprol , Cardizem  added - RHC 1/16, RA mean 11, RV 32/12, PA 30/13, wedge 15  Subjective: - Feels better, had a bowel movement, breathing is improving, wants to go to rehab if that is an option  Assessment and Plan:  Acute on chronic diastolic CHF - Echo with EF greater than 75%, indeterminate diastolic parameters, limited by obesity - 5.5 L negative, volume status was difficult to assess - RHC 1/16, RA mean 11, RV 32/12, PA 30/13, wedge 15 - continue metoprolol , resume farxiga  on canagliflozin at baseline - Now switched to oral torsemide  - Increase activity, PT OT, requiring 3+ assistance, TOC following, limited SNF options due to weight, medically stable  Atypical chest pain - Etiology is unclear, suspected to be SVT related, now improved - No ACS at this time, CTA chest negative for PE - Cards following, echo without wall motion abnormality - Follow-up with cardiology, no plans for ischemic workup at this time  SVT - Required Cardizem  gtt. on admission, now off - Cards following, started on Cardizem  p.o., continue metoprolol , dose increased - Heart rate significantly improved  Uncontrolled type 2 diabetes mellitus with hyperglycemia - Metformin  on hold - CBGs remain elevated, A1c is 10.6 - Increased Semglee , and meal coverage diabetes coordinator and dietitian consult -  Care was discussed with PCP Wanda Gaither, NP [facility], plan to start GLP-1 agonist after discharge, they think he will be unable to manage insulin  at home  Morbid obesity, BMI > 72 Suspected OSA OHS -Has not been formally diagnosed with OSA, will need sleep study as outpatient - Monitor nocturnal sats  Hypothyroidism Continue Synthroid , TSH mildly elevated, recommend repeat labs in 6 weeks   DVT prophylaxis:  Lovenox  Code Status: Full code Family Communication: None present Disposition Plan: Home versus short-term rehab, TBD  Consultants:    Procedures:   Antimicrobials:    Objective: Vitals:   07/08/24 2356 07/09/24 0338 07/09/24 0452 07/09/24 0713  BP: (!) 116/58 (!) 101/55  122/60  Pulse: 70 68  70  Resp: 20 19  18   Temp: 98.3 F (36.8 C) 98.3 F (36.8 C)  98.2 F (36.8 C)  TempSrc: Oral Oral  Oral  SpO2: 99% 97%  98%  Weight:   (!) 205.9 kg   Height:        Intake/Output Summary (Last 24 hours) at 07/09/2024 1001 Last data filed at 07/09/2024 0841 Gross per 24 hour  Intake 1194 ml  Output 1700 ml  Net -506 ml   Filed Weights   07/07/24 0444 07/08/24 0320 07/09/24 0452  Weight: (!) 194.6 kg (!) 202.8 kg (!) 205.9 kg    Examination:  General exam: Morbidly obese chronically ill, AAO x 3, no distress HEENT: Neck obese unable to assess JVD CVS: S1-S2, regular rhythm Lungs: Distant otherwise clear Abdomen: Soft, nontender, bowel sounds present Extremities: Trace edema, chronic stasis changes, Unna boots Skin: As  above Psychiatry:  Mood & affect appropriate.     Data Reviewed:   CBC: Recent Labs  Lab 07/03/24 1530 07/04/24 0317 07/05/24 0307 07/05/24 0951  WBC 7.5 8.9 9.9 8.4  NEUTROABS  --  5.4  --   --   HGB 14.1 14.1 13.5 13.8  HCT 42.2 42.3 40.0 40.4  MCV 103.2* 100.7* 99.5 98.8  PLT 158 186 190 197   Basic Metabolic Panel: Recent Labs  Lab 07/03/24 2232 07/04/24 0317 07/05/24 0307 07/05/24 0951 07/06/24 0415  07/07/24 0241 07/08/24 0308 07/09/24 0245  NA  --  133* 131*  --  131* 130* 136 135  K  --  4.1 4.2  --  4.3 4.2 4.4 4.2  CL  --  95* 94*  --  93* 93* 98 98  CO2  --  26 26  --  29 28 28 27   GLUCOSE  --  298* 303*  --  243* 263* 189* 155*  BUN  --  16 23  --  27* 25* 28* 26*  CREATININE  --  1.07 1.59* 1.60* 1.51* 1.22 1.49* 1.36*  CALCIUM   --  9.0 8.7*  --  8.7* 8.7* 8.7* 8.8*  MG 1.7 1.6*  --   --  2.1  --   --   --   PHOS  --  3.3  --   --   --   --   --   --    GFR: Estimated Creatinine Clearance: 93.6 mL/min (A) (by C-G formula based on SCr of 1.36 mg/dL (H)). Liver Function Tests: Recent Labs  Lab 07/04/24 0317 07/05/24 0307  AST 56* 47*  ALT 45* 39  ALKPHOS 125 123  BILITOT 0.6 0.6  PROT 7.2 6.9  ALBUMIN 3.2* 3.0*   Recent Labs  Lab 07/04/24 0317  LIPASE 18   No results for input(s): AMMONIA in the last 168 hours. Coagulation Profile: No results for input(s): INR, PROTIME in the last 168 hours. Cardiac Enzymes: No results for input(s): CKTOTAL, CKMB, CKMBINDEX, TROPONINI in the last 168 hours. BNP (last 3 results) Recent Labs    05/03/24 1557 07/03/24 1620 07/04/24 0317  PROBNP 299.0 1,802.0* 1,527.0*   HbA1C: No results for input(s): HGBA1C in the last 72 hours.  CBG: Recent Labs  Lab 07/08/24 0551 07/08/24 1112 07/08/24 1634 07/08/24 2042 07/09/24 0607  GLUCAP 164* 225* 199* 170* 148*   Lipid Profile: No results for input(s): CHOL, HDL, LDLCALC, TRIG, CHOLHDL, LDLDIRECT in the last 72 hours.  Thyroid Function Tests: No results for input(s): TSH, T4TOTAL, FREET4, T3FREE, THYROIDAB in the last 72 hours.  Anemia Panel: No results for input(s): VITAMINB12, FOLATE, FERRITIN, TIBC, IRON, RETICCTPCT in the last 72 hours. Urine analysis:    Component Value Date/Time   COLORURINE YELLOW 12/19/2021 1252   APPEARANCEUR CLEAR 12/19/2021 1252   APPEARANCEUR Clear 12/05/2015 1226   LABSPEC 1.037 (H)  12/19/2021 1252   PHURINE 5.0 12/19/2021 1252   GLUCOSEU >=500 (A) 12/19/2021 1252   HGBUR NEGATIVE 12/19/2021 1252   BILIRUBINUR NEGATIVE 12/19/2021 1252   BILIRUBINUR Negative 12/05/2015 1226   KETONESUR NEGATIVE 12/19/2021 1252   PROTEINUR NEGATIVE 12/19/2021 1252   UROBILINOGEN 0.2 12/28/2013 1117   NITRITE NEGATIVE 12/19/2021 1252   LEUKOCYTESUR NEGATIVE 12/19/2021 1252   Sepsis Labs: @LABRCNTIP (procalcitonin:4,lacticidven:4)  )No results found for this or any previous visit (from the past 240 hours).   Radiology Studies: No results found.     Scheduled Meds:  aspirin  EC  81 mg  Oral Daily   atorvastatin   80 mg Oral Daily   dapagliflozin  propanediol  10 mg Oral Daily   diltiazem   240 mg Oral Daily   enoxaparin  (LOVENOX ) injection  100 mg Subcutaneous Q24H   fenofibrate   160 mg Oral Daily   insulin  aspart  0-15 Units Subcutaneous TID WC   insulin  aspart  0-5 Units Subcutaneous QHS   insulin  aspart  6 Units Subcutaneous TID WC   insulin  glargine  45 Units Subcutaneous Daily   lactulose   20 g Oral BID   levothyroxine   150 mcg Oral QODAY   And   levothyroxine   175 mcg Oral QODAY   metoprolol  succinate  50 mg Oral Daily   pantoprazole   40 mg Oral QHS   sodium chloride  flush  3 mL Intravenous Q12H   torsemide   20 mg Oral Daily   Continuous Infusions:      LOS: 5 days    Time spent:    Sigurd Pac, MD [facility]   07/09/2024, 10:01 AM    "

## 2024-07-09 NOTE — Plan of Care (Signed)
  Problem: Education: Goal: Ability to describe self-care measures that may prevent or decrease complications (Diabetes Survival Skills Education) will improve Outcome: Progressing Goal: Individualized Educational Video(s) Outcome: Progressing   Problem: Coping: Goal: Ability to adjust to condition or change in health will improve Outcome: Progressing   Problem: Fluid Volume: Goal: Ability to maintain a balanced intake and output will improve Outcome: Progressing   Problem: Health Behavior/Discharge Planning: Goal: Ability to identify and utilize available resources and services will improve Outcome: Progressing Goal: Ability to manage health-related needs will improve Outcome: Progressing   Problem: Nutritional: Goal: Maintenance of adequate nutrition will improve Outcome: Progressing Goal: Progress toward achieving an optimal weight will improve Outcome: Progressing   Problem: Skin Integrity: Goal: Risk for impaired skin integrity will decrease Outcome: Progressing   Problem: Tissue Perfusion: Goal: Adequacy of tissue perfusion will improve Outcome: Progressing   Problem: Education: Goal: Knowledge of General Education information will improve Description: Including pain rating scale, medication(s)/side effects and non-pharmacologic comfort measures Outcome: Progressing

## 2024-07-09 NOTE — TOC Progression Note (Signed)
 Transition of Care [facility]) - Progression Note    Patient Details  Name: Ronnie Hernandez MRN: [id number] Date of Birth: December 19, 1960  Transition of Care [facility]) CM/SW Contact  Luise JAYSON Pan, [place] Phone Number: 07/09/2024, 10:28 AM  Clinical Narrative:   [facility] only in network with 3 facilities that have bariatric bed weight limits, at this time:   - [facility]: 350 lbs - [facility]: 300 lbs - [facility]: 350 lbs  Patient will have to dc home at this time.   Expected Discharge Plan: Home w Home Health Services Barriers to Discharge: Continued Medical Work up               Expected Discharge Plan and Services In-house Referral: NA Discharge Planning Services: CM Consult Post Acute Care Choice: Home Health Living arrangements for the past 2 months: Apartment                                       Social Drivers of Health (SDOH) Interventions SDOH Screenings   Food Insecurity: No Food Insecurity (07/04/2024)  Housing: Unknown (07/05/2024)  Transportation Needs: No Transportation Needs (07/04/2024)  Utilities: Not At Risk (07/04/2024)  Alcohol Screen: Low Risk (07/05/2024)  Financial Resource Strain: Low Risk (07/05/2024)  Social Connections: Unknown (07/04/2024)  Tobacco Use: Medium Risk (07/03/2024)    Readmission Risk Interventions     No data to display

## 2024-07-10 ENCOUNTER — Other Ambulatory Visit (HOSPITAL_COMMUNITY): Payer: Self-pay

## 2024-07-10 LAB — BASIC METABOLIC PANEL WITH GFR
Anion gap: 10 (ref 5–15)
BUN: 28 mg/dL — ABNORMAL HIGH (ref 8–23)
CO2: 28 mmol/L (ref 22–32)
Calcium: 8.9 mg/dL (ref 8.9–10.3)
Chloride: 97 mmol/L — ABNORMAL LOW (ref 98–111)
Creatinine, Ser: 1.5 mg/dL — ABNORMAL HIGH (ref 0.61–1.24)
GFR, Estimated: 52 mL/min — ABNORMAL LOW
Glucose, Bld: 138 mg/dL — ABNORMAL HIGH (ref 70–99)
Potassium: 4 mmol/L (ref 3.5–5.1)
Sodium: 136 mmol/L (ref 135–145)

## 2024-07-10 LAB — MAGNESIUM: Magnesium: 2.2 mg/dL (ref 1.7–2.4)

## 2024-07-10 LAB — GLUCOSE, CAPILLARY
Glucose-Capillary: 122 mg/dL — ABNORMAL HIGH (ref 70–99)
Glucose-Capillary: 182 mg/dL — ABNORMAL HIGH (ref 70–99)

## 2024-07-10 MED ORDER — TORSEMIDE 20 MG PO TABS
20.0000 mg | ORAL_TABLET | Freq: Every day | ORAL | 0 refills | Status: DC
  Filled 2024-07-10: qty 60, 60d supply, fill #0

## 2024-07-10 MED ORDER — DAPAGLIFLOZIN PROPANEDIOL 10 MG PO TABS: 10.0000 mg | ORAL_TABLET | Freq: Every day | ORAL | 0 refills | Status: AC

## 2024-07-10 MED ORDER — LACTULOSE 10 GM/15ML PO SOLN: 20.0000 g | Freq: Every day | ORAL | 0 refills | Status: AC | PRN

## 2024-07-10 MED ORDER — DAPAGLIFLOZIN PROPANEDIOL 10 MG PO TABS
10.0000 mg | ORAL_TABLET | Freq: Every day | ORAL | 0 refills | Status: DC
  Filled 2024-07-10: qty 60, 60d supply, fill #0

## 2024-07-10 MED ORDER — ASPIRIN 81 MG PO TBEC
81.0000 mg | DELAYED_RELEASE_TABLET | Freq: Every day | ORAL | 0 refills | Status: DC
  Filled 2024-07-10: qty 30, 30d supply, fill #0

## 2024-07-10 MED ORDER — DILTIAZEM HCL ER COATED BEADS 180 MG PO CP24
180.0000 mg | ORAL_CAPSULE | Freq: Every day | ORAL | 0 refills | Status: DC
  Filled 2024-07-10: qty 60, 60d supply, fill #0

## 2024-07-10 MED ORDER — DILTIAZEM HCL ER COATED BEADS 180 MG PO CP24
180.0000 mg | ORAL_CAPSULE | Freq: Every day | ORAL | Status: DC
  Administered 2024-07-10: 180 mg via ORAL
  Filled 2024-07-10: qty 1

## 2024-07-10 MED ORDER — METOPROLOL SUCCINATE ER 50 MG PO TB24: 50.0000 mg | ORAL_TABLET | Freq: Every day | ORAL | 0 refills | Status: AC

## 2024-07-10 MED ORDER — LACTULOSE 10 GM/15ML PO SOLN
20.0000 g | Freq: Every day | ORAL | 0 refills | Status: DC | PRN
  Filled 2024-07-10: qty 236, 7d supply, fill #0

## 2024-07-10 MED ORDER — ASPIRIN 81 MG PO TBEC: 81.0000 mg | DELAYED_RELEASE_TABLET | Freq: Every day | ORAL | 0 refills | Status: AC

## 2024-07-10 MED ORDER — DILTIAZEM HCL ER COATED BEADS 180 MG PO CP24: 180.0000 mg | ORAL_CAPSULE | Freq: Every day | ORAL | 0 refills | Status: AC

## 2024-07-10 MED ORDER — TORSEMIDE 20 MG PO TABS: 20.0000 mg | ORAL_TABLET | Freq: Every day | ORAL | 0 refills | Status: AC

## 2024-07-10 MED ORDER — METOPROLOL SUCCINATE ER 50 MG PO TB24
50.0000 mg | ORAL_TABLET | Freq: Every day | ORAL | 0 refills | Status: DC
  Filled 2024-07-10: qty 60, 60d supply, fill #0

## 2024-07-10 NOTE — Progress Notes (Signed)
 AVS completed for discharge packet and placed with chart.

## 2024-07-10 NOTE — TOC Progression Note (Addendum)
 Transition of Care [facility]) - Progression Note    Patient Details  Name: DANTRELL SCHERTZER MRN: [id number] Date of Birth: 1960-11-05  Transition of Care [facility]) CM/SW Contact  Waddell Barnie Rama, RN Phone Number: 07/10/2024, 11:53 AM  Clinical Narrative:    NCM spoke with Francina at [facility], she transferred this NCM  to Edna Eagles , this NCM left vm for her to return call regarding the increasing of hours of the aide for this patient.  NCM received call back from [place] stating that patient can be dc today, his aide will be there at 4 pm today and then he will have another aide in the am.  The [facility] team will evaluate him tomorrow and discuss increasing his aide hours.  He can take ptar home at dc.     Expected Discharge Plan: Home w Home Health Services Barriers to Discharge: Continued Medical Work up               Expected Discharge Plan and Services In-house Referral: NA Discharge Planning Services: CM Consult Post Acute Care Choice: Home Health Living arrangements for the past 2 months: Apartment                                       Social Drivers of Health (SDOH) Interventions SDOH Screenings   Food Insecurity: No Food Insecurity (07/04/2024)  Housing: Unknown (07/05/2024)  Transportation Needs: No Transportation Needs (07/04/2024)  Utilities: Not At Risk (07/04/2024)  Alcohol Screen: Low Risk (07/05/2024)  Financial Resource Strain: Low Risk (07/05/2024)  Social Connections: Unknown (07/04/2024)  Tobacco Use: Medium Risk (07/03/2024)    Readmission Risk Interventions     No data to display

## 2024-07-10 NOTE — Discharge Summary (Signed)
 Physician Discharge Summary  Ronnie Hernandez FMW:[id number] DOB: 12/02/1960 DOA: 07/03/2024  PCP: Gaither Gear, NP-C  Admit date: 07/03/2024 Discharge date: 07/10/2024  Time spent: 45 minutes  Recommendations for Outpatient Follow-up:  [facility]P [facility] in 1 week, consider starting GLP-1 receptor agonist for uncontrolled diabetes and obesity [facility], referral sent Home health services and increased caregiver hours through [facility] Suggest outpatient sleep study as well   Discharge Diagnoses:  Principal Problem:   Acute on chronic diastolic heart failure (HCC) SVT Atypical chest pain Morbid obesity, BMI 72 Significant debility   HLD (hyperlipidemia)   Morbid obesity (HCC)   DM2 (diabetes mellitus, type 2) (HCC)   SOB (shortness of breath)   Atypical chest pain   Elevated troponin   Prolonged QT interval   History of essential hypertension   Acquired hypothyroidism   Discharge Condition: Improved  Diet recommendation: Healthy  Filed Weights   07/08/24 0320 07/09/24 0452 07/10/24 0500  Weight: (!) 202.8 kg (!) 205.9 kg (!) 200.5 kg    History of present illness:  chronically ill 63/M with diastolic CHF, morbid obesity, BMI >70, type 2 diabetes, rheumatoid arthritis, history of PE, gout, CKD 3 presented to the ED with chest pain and shortness of breath, intermittent episodes of chest pain since this weekend, he has chronic dyspnea and orthopnea. In the ED sodium 132, creatinine 1.1, proBNP 06/28/2000, troponin 105, CTA chest no acute findings Admitted, started on diuretics - Intermittent episodes of SVT, started on metoprolol  and Cardizem  - Seen by EP, increase Toprol , Cardizem  added - RHC 1/16, RA mean 11, RV 32/12, PA 30/13, wedge 15  Hospital Course:   Acute on chronic diastolic CHF - Echo with EF greater than 75%, indeterminate diastolic parameters, limited by obesity - 5.5 L negative, volume status was difficult to assess - RHC 1/16, RA mean 11, RV  32/12, PA 30/13, wedge 15 - continue metoprolol , resume farxiga - on canagliflozin at baseline - Now switched to oral torsemide  - Increase activity, PT OT, requiring 2+ assistance, TOC following, limited SNF options due to weight, medically stable - TOC team [facility] with [facility], they will increase his caregiver hours, and recommended GLP-1 receptor agonist for weight loss   Atypical chest pain - Etiology is unclear, suspected to be SVT related, now improved - No ACS at this time, CTA chest negative for PE - Cards following, echo without wall motion abnormality - Follow-up with cardiology, no plans for ischemic workup at this time -Follow-up with [facility], referral sent   SVT - Required Cardizem  gtt. on admission, now off - Cards following, started on Cardizem  p.o., continue metoprolol  - Heart rate improved and stable now   Uncontrolled type 2 diabetes mellitus with hyperglycemia - Metformin  on hold - CBGs remain elevated, A1c is 10.6 - Increased Semglee , and meal coverage diabetes coordinator and dietitian consult - Care was discussed with PCP Wanda Gaither, NP at [facility], plan to start GLP-1 agonist after discharge, they think he will be unable to manage insulin  at home, discussed with patient again he will be unable to self administer insulin  reliably   Morbid obesity, BMI > 72 Suspected OSA OHS -Has not been formally diagnosed with OSA, will need sleep study as outpatient - Monitor nocturnal sats   Hypothyroidism Continue Synthroid , TSH mildly elevated, recommend repeat labs in 6 weeks  Discharge Exam: Vitals:   07/10/24 1000 07/10/24 1126  BP: (!) 123/53 111/61  Pulse:  64  Resp:  18  Temp:  97.8 F (36.6 C)  SpO2:  100%    Gen: Awake, Alert, Oriented X 3, morbidly obese, chronically ill HEENT: Neck obese unable to assess JVD Lungs: Distant breath sounds CVS: S1S2/RRR Abd: soft, Non tender, non distended, BS present Extremities:  Chronic venous stasis changes, Unna boots Skin: as above   Discharge Instructions    Allergies as of 07/10/2024       Reactions   Probenecid Diarrhea   Uloric  [febuxostat ] Nausea Only, Other (See Comments)   Made the skin on feet and legs hurt        Medication List     STOP taking these medications    furosemide  40 MG tablet Commonly known as: LASIX    Invokana 300 MG Tabs tablet Generic drug: canagliflozin   losartan  25 MG tablet Commonly known as: COZAAR        TAKE these medications    acetaminophen  500 MG tablet Commonly known as: TYLENOL  Take 500-1,000 mg by mouth every 6 (six) hours as needed for mild pain (pain score 1-3) or moderate pain (pain score 4-6).   albuterol  108 (90 Base) MCG/ACT inhaler Commonly known as: VENTOLIN  HFA Inhale 1-2 puffs into the lungs every 6 (six) hours as needed for wheezing or shortness of breath.   allopurinol  300 MG tablet Commonly known as: ZYLOPRIM  Take 1 tablet (300 mg total) by mouth 2 (two) times daily. What changed:  how much to take when to take this   aspirin  EC 81 MG tablet Take 1 tablet (81 mg total) by mouth daily. Swallow whole. Start taking on: July 11, 2024   benzonatate  100 MG capsule Commonly known as: TESSALON  Take 100 mg by mouth 3 (three) times daily as needed for cough.   colchicine  0.6 MG tablet Take 1 tablet (0.6 mg total) by mouth daily. What changed:  how much to take when to take this additional instructions   cyanocobalamin 500 MCG tablet Commonly known as: VITAMIN B12 Take 500 mcg by mouth daily.   dapagliflozin  propanediol 10 MG Tabs tablet Commonly known as: FARXIGA  Take 1 tablet (10 mg total) by mouth daily. Start taking on: July 11, 2024   diltiazem  180 MG 24 hr capsule Commonly known as: CARDIZEM  CD Take 1 capsule (180 mg total) by mouth daily. Start taking on: July 11, 2024   fenofibrate  160 MG tablet Take 160 mg by mouth every evening.   guaiFENesin  100  MG/5ML liquid Commonly known as: ROBITUSSIN Take 5 mLs by mouth every 6 (six) hours as needed for cough or to loosen phlegm.   lactulose  10 GM/15ML solution Commonly known as: CHRONULAC  Take 30 mLs (20 g total) by mouth daily as needed for mild constipation.   levothyroxine  150 MCG tablet Commonly known as: SYNTHROID  Take 150 mcg by mouth every other day. Alternate with 175mcg dose   levothyroxine  175 MCG tablet Commonly known as: SYNTHROID  Take 175 mcg by mouth every other day. Alternate with 150mcg dose.   metFORMIN  500 MG 24 hr tablet Commonly known as: GLUCOPHAGE -XR Take 1,000 mg by mouth 2 (two) times daily.   metoprolol  succinate 50 MG 24 hr tablet Commonly known as: TOPROL -XL Take 1 tablet (50 mg total) by mouth daily. Take with or immediately following a meal. Start taking on: July 11, 2024   Miconazole Nitrate 2 % Soln Apply 1 spray topically 2 (two) times daily.   nystatin  powder Apply 1 Application topically 3 (three) times daily.   rosuvastatin  40  MG tablet Commonly known as: CRESTOR  Take 40 mg by mouth at bedtime.   Rybelsus 3 MG Tabs Generic drug: Semaglutide Take 3 mg by mouth daily.   torsemide  20 MG tablet Commonly known as: DEMADEX  Take 1 tablet (20 mg total) by mouth daily. Start taking on: July 11, 2024   vitamin D3 50 MCG (2000 UT) Caps Take 1,000 Units by mouth daily.   zinc oxide 11.3 % Crea cream Commonly known as: BALMEX Apply 1 Application topically daily as needed (Rash).       Allergies[1]  Follow-up Information      [facility]. Go in 6 day(s).   Specialty: Cardiology Why: Hospital follow up 07/11/24 @ 2 pm Bring  a current medication list to appointment Contact information: [address] [place]  [phone]                 The results of significant diagnostics from this hospitalization (including imaging, microbiology, ancillary and  laboratory) are listed below for reference.    Significant Diagnostic Studies: CARDIAC CATHETERIZATION Result Date: 07/06/2024 RHC DATA: RA mean 11 mmHg RV 32/12 mmHg PA 30/13 mean 20 mmHg PCWP 15 mmHg CO/CI 5.9/2.08 Mild elevation in RA pressure. Otherwise intracardiac pressures within normal limits. Cardiac output in normal range. Doubt cardiac index accurate in setting of BSA calculation with super morbid obesity.   ECHOCARDIOGRAM COMPLETE Result Date: 07/04/2024    ECHOCARDIOGRAM REPORT   Patient Name:   CLAYTON BOSSERMAN Date of Exam: 07/04/2024 Medical Rec #:  [id number]         Height:       66.0 in Accession #:    [id number]        Weight:       445.0 lb Date of Birth:  Sep 07, 1960        BSA:          2.809 m Patient Age:    63 years          BP:           91/53 mmHg Patient Gender: M                 HR:           138 bpm. Exam Location:  Inpatient Procedure: 2D Echo, Cardiac Doppler, Color Doppler and Intracardiac            Opacification Agent (Both Spectral and Color Flow Doppler were            utilized during procedure). Indications:    CHF - Acute Diastolic  History:        Patient has prior history of Echocardiogram examinations, most                 recent 12/21/2021. CHF, Signs/Symptoms:Shortness of Breath, Chest                 Pain and Edema; Risk Factors:Diabetes, Dyslipidemia,                 Hypertension and Former Smoker.  Sonographer:    Juliene Rucks Referring Phys: [id number] EVA KATHEE PORE  Sonographer Comments: Technically difficult study due to poor echo windows, no subcostal window and patient is obese. Image acquisition challenging due to patient body habitus. IMPRESSIONS  1. Left ventricular ejection fraction, by estimation, is >75%. The left ventricle has hyperdynamic function. The left ventricle has no regional wall motion abnormalities. Left ventricular diastolic parameters  are indeterminate.  2. Right ventricular systolic function is normal. The right ventricular size is  normal.  3. The mitral valve is normal in structure. No evidence of mitral valve regurgitation. No evidence of mitral stenosis.  4. The aortic valve is tricuspid. Aortic valve regurgitation is not visualized. No aortic stenosis is present. FINDINGS  Left Ventricle: Left ventricular ejection fraction, by estimation, is >75%. The left ventricle has hyperdynamic function. The left ventricle has no regional wall motion abnormalities. Definity  contrast agent was given IV to delineate the left ventricular endocardial borders. The left ventricular internal cavity size was normal in size. There is no left ventricular hypertrophy. Left ventricular diastolic parameters are indeterminate. Right Ventricle: The right ventricular size is normal. Right ventricular systolic function is normal. Left Atrium: Left atrial size was normal in size. Right Atrium: Right atrial size was normal in size. Pericardium: There is no evidence of pericardial effusion. Mitral Valve: The mitral valve is normal in structure. No evidence of mitral valve regurgitation. No evidence of mitral valve stenosis. Tricuspid Valve: The tricuspid valve is not well visualized. Tricuspid valve regurgitation is trivial. No evidence of tricuspid stenosis. Aortic Valve: The aortic valve is tricuspid. Aortic valve regurgitation is not visualized. No aortic stenosis is present. Pulmonic Valve: The pulmonic valve was not well visualized. Pulmonic valve regurgitation is not visualized. No evidence of pulmonic stenosis. Aorta: The aortic root is normal in size and structure. Venous: The inferior vena cava was not well visualized. IAS/Shunts: The interatrial septum was not well visualized.  LEFT VENTRICLE PLAX 2D LVIDd:         4.00 cm LVIDs:         3.40 cm LV PW:         1.10 cm LV IVS:        1.10 cm LVOT diam:     2.20 cm LVOT Area:     3.80 cm  LV Volumes (MOD) LV vol d, MOD A4C: 117.0 ml LV vol s, MOD A4C: 76.0 ml LV SV MOD A4C:     117.0 ml RIGHT VENTRICLE TAPSE  (M-mode): 1.0 cm LEFT ATRIUM         Index LA diam:    2.90 cm 1.03 cm/m   AORTA Ao Root diam: 3.40 cm  SHUNTS Systemic Diam: 2.20 cm Redell Shallow MD Electronically signed by Redell Shallow MD Signature Date/Time: 07/04/2024/8:55:27 AM    Final    CT Angio Chest PE W and/or Wo Contrast Result Date: 07/03/2024 CLINICAL DATA:  Pulmonary embolism (PE) suspected, high prob, chest pain, short of breath EXAM: CT ANGIOGRAPHY CHEST WITH CONTRAST TECHNIQUE: Multidetector CT imaging of the chest was performed using the standard protocol during bolus administration of intravenous contrast. Multiplanar CT image reconstructions and MIPs were obtained to evaluate the vascular anatomy. RADIATION DOSE REDUCTION: This exam was performed according to the departmental dose-optimization program which includes automated exposure control, adjustment of the mA and/or kV according to patient size and/or use of iterative reconstruction technique. CONTRAST:  OMNIPAQUE  IOHEXOL  350 MG/ML SOLN COMPARISON:  03/03/2025, 05/03/2024 FINDINGS: Cardiovascular: This is a technically adequate evaluation of the pulmonary vasculature. No filling defects or pulmonary emboli. The heart is unremarkable without pericardial effusion. No evidence of thoracic aortic aneurysm or dissection. Atherosclerosis of the thoracic aorta. Mediastinum/Nodes: No enlarged mediastinal, hilar, or axillary lymph nodes. Thyroid gland, trachea, and esophagus demonstrate no significant findings. Lungs/Pleura: Minimal hypoventilatory changes at the lung bases. No airspace disease, effusion, or pneumothorax. Central airways are patent.  Upper Abdomen: No acute abnormality. Musculoskeletal: No acute or destructive bony abnormalities. Reconstructed images demonstrate no additional findings. Review of the MIP images confirms the above findings. IMPRESSION: 1. No evidence of pulmonary embolus. 2. No acute intrathoracic process. 3.  Aortic Atherosclerosis (ICD10-I70.0).  Electronically Signed   By: Ozell Daring M.D.   On: 07/03/2024 18:23   DG Chest Port 1 View Result Date: 07/03/2024 CLINICAL DATA:  Chest pain, short of breath EXAM: PORTABLE CHEST 1 VIEW COMPARISON:  05/03/2024 FINDINGS: The heart size and mediastinal contours are within normal limits. Both lungs are clear. The visualized skeletal structures are unremarkable. IMPRESSION: No active disease. Electronically Signed   By: Ozell Daring M.D.   On: 07/03/2024 15:45    Microbiology: No results found for this or any previous visit (from the past 240 hours).   Labs: Basic Metabolic Panel: Recent Labs  Lab 07/03/24 2232 07/04/24 0317 07/05/24 0307 07/06/24 0415 07/06/24 1559 07/07/24 0241 07/08/24 0308 07/09/24 0245 07/10/24 0646  NA  --  133*   < > 131* 136 130* 136 135 136  K  --  4.1   < > 4.3 3.9 4.2 4.4 4.2 4.0  CL  --  95*   < > 93*  --  93* 98 98 97*  CO2  --  26   < > 29  --  28 28 27 28   GLUCOSE  --  298*   < > 243*  --  263* 189* 155* 138*  BUN  --  16   < > 27*  --  25* 28* 26* 28*  CREATININE  --  1.07   < > 1.51*  --  1.22 1.49* 1.36* 1.50*  CALCIUM   --  9.0   < > 8.7*  --  8.7* 8.7* 8.8* 8.9  MG 1.7 1.6*  --  2.1  --   --   --   --  2.2  PHOS  --  3.3  --   --   --   --   --   --   --    < > = values in this interval not displayed.   Liver Function Tests: Recent Labs  Lab 07/04/24 0317 07/05/24 0307  AST 56* 47*  ALT 45* 39  ALKPHOS 125 123  BILITOT 0.6 0.6  PROT 7.2 6.9  ALBUMIN 3.2* 3.0*   Recent Labs  Lab 07/04/24 0317  LIPASE 18   No results for input(s): AMMONIA in the last 168 hours. CBC: Recent Labs  Lab 07/03/24 1530 07/04/24 0317 07/05/24 0307 07/05/24 0951 07/06/24 1559  WBC 7.5 8.9 9.9 8.4  --   NEUTROABS  --  5.4  --   --   --   HGB 14.1 14.1 13.5 13.8 15.0  HCT 42.2 42.3 40.0 40.4 44.0  MCV 103.2* 100.7* 99.5 98.8  --   PLT 158 186 190 197  --    Cardiac Enzymes: No results for input(s): CKTOTAL, CKMB, CKMBINDEX,  TROPONINI in the last 168 hours. BNP: BNP (last 3 results) Recent Labs    08/27/23 1028  BNP 63.8    ProBNP (last 3 results) Recent Labs    05/03/24 1557 07/03/24 1620 07/04/24 0317  PROBNP 299.0 1,802.0* 1,527.0*    CBG: Recent Labs  Lab 07/09/24 1042 07/09/24 1558 07/09/24 2112 07/10/24 0613 07/10/24 1122  GLUCAP 157* 120* 134* 122* 182*       Signed:  Sigurd Pac MD.  [facility] 07/10/2024, 1:39 PM        [  1]  Allergies Allergen Reactions   Probenecid Diarrhea   Uloric  [Febuxostat ] Nausea Only and Other (See Comments)    Made the skin on feet and legs hurt

## 2024-07-10 NOTE — Progress Notes (Signed)
 " PROGRESS NOTE    Ronnie Hernandez  FMW:[id number] DOB: 03/20/1961 DOA: 07/03/2024 PCP: Gaither Gear, NP-C   chronically ill 63/M with diastolic CHF, morbid obesity, BMI >70, type 2 diabetes, rheumatoid arthritis, history of PE, gout, CKD 3 presented to the ED with chest pain and shortness of breath, intermittent episodes of chest pain since this weekend, he has chronic dyspnea and orthopnea. In the ED sodium 132, creatinine 1.1, proBNP 06/28/2000, troponin 105, CTA chest no acute findings Admitted, started on diuretics - Intermittent episodes of SVT, started on metoprolol  and Cardizem  - Seen by EP, increase Toprol , Cardizem  added - RHC 1/16, RA mean 11, RV 32/12, PA 30/13, wedge 15  Subjective: - Had bradycardia overnight,  Assessment and Plan:  Acute on chronic diastolic CHF - Echo with EF greater than 75%, indeterminate diastolic parameters, limited by obesity - 5.5 L negative, volume status was difficult to assess - RHC 1/16, RA mean 11, RV 32/12, PA 30/13, wedge 15 - continue metoprolol , resume farxiga - on canagliflozin at baseline - Now switched to oral torsemide  - Increase activity, PT OT, requiring 3+ assistance, TOC following, limited SNF options due to weight, medically stable - PT OT to reevaluate today, TOC to work on caregiver hours  Atypical chest pain - Etiology is unclear, suspected to be SVT related, now improved - No ACS at this time, CTA chest negative for PE - Cards following, echo without wall motion abnormality - Follow-up with cardiology, no plans for ischemic workup at this time  SVT - Required Cardizem  gtt. on admission, now off - Cards following, started on Cardizem  p.o., continue metoprolol  - Heart rate significantly improved, bradycardic overnight will decrease Cardizem  dose  Uncontrolled type 2 diabetes mellitus with hyperglycemia - Metformin  on hold - CBGs remain elevated, A1c is 10.6 - Increased Semglee , and meal coverage diabetes coordinator and  dietitian consult - Care was discussed with PCP Wanda Gaither, NP at [facility], plan to start GLP-1 agonist after discharge, they think he will be unable to manage insulin  at home  Morbid obesity, BMI > 72 Suspected OSA OHS -Has not been formally diagnosed with OSA, will need sleep study as outpatient - Monitor nocturnal sats  Hypothyroidism Continue Synthroid , TSH mildly elevated, recommend repeat labs in 6 weeks   DVT prophylaxis:  Lovenox  Code Status: Full code Family Communication: None present Disposition Plan: Home with increased support  Consultants:    Procedures:   Antimicrobials:    Objective: Vitals:   07/10/24 0331 07/10/24 0500 07/10/24 0721 07/10/24 0728  BP: (!) 112/53  (!) 106/48 (!) 98/53  Pulse:   63   Resp: 17  17 17   Temp: 98 F (36.7 C)  97.9 F (36.6 C)   TempSrc: Oral  Oral   SpO2: 94%  100%   Weight:  (!) 200.5 kg    Height:        Intake/Output Summary (Last 24 hours) at 07/10/2024 0957 Last data filed at 07/10/2024 0859 Gross per 24 hour  Intake 814 ml  Output 2600 ml  Net -1786 ml   Filed Weights   07/08/24 0320 07/09/24 0452 07/10/24 0500  Weight: (!) 202.8 kg (!) 205.9 kg (!) 200.5 kg    Examination:  Gen: Awake, Alert, Oriented X 3, morbidly obese, chronically ill HEENT: Neck obese unable to assess JVD Lungs: Distant breath sounds CVS: S1S2/RRR Abd: soft, Non tender, non distended, BS present Extremities: Chronic venous stasis changes, Unna boots Skin: as above    Data  Reviewed:   CBC: Recent Labs  Lab 07/03/24 1530 07/04/24 0317 07/05/24 0307 07/05/24 0951 07/06/24 1559  WBC 7.5 8.9 9.9 8.4  --   NEUTROABS  --  5.4  --   --   --   HGB 14.1 14.1 13.5 13.8 15.0  HCT 42.2 42.3 40.0 40.4 44.0  MCV 103.2* 100.7* 99.5 98.8  --   PLT 158 186 190 197  --    Basic Metabolic Panel: Recent Labs  Lab 07/03/24 2232 07/04/24 0317 07/05/24 0307 07/06/24 0415 07/06/24 1559 07/07/24 0241 07/08/24 0308  07/09/24 0245 07/10/24 0646  NA  --  133*   < > 131* 136 130* 136 135 136  K  --  4.1   < > 4.3 3.9 4.2 4.4 4.2 4.0  CL  --  95*   < > 93*  --  93* 98 98 97*  CO2  --  26   < > 29  --  28 28 27 28   GLUCOSE  --  298*   < > 243*  --  263* 189* 155* 138*  BUN  --  16   < > 27*  --  25* 28* 26* 28*  CREATININE  --  1.07   < > 1.51*  --  1.22 1.49* 1.36* 1.50*  CALCIUM   --  9.0   < > 8.7*  --  8.7* 8.7* 8.8* 8.9  MG 1.7 1.6*  --  2.1  --   --   --   --  2.2  PHOS  --  3.3  --   --   --   --   --   --   --    < > = values in this interval not displayed.   GFR: Estimated Creatinine Clearance: 83.4 mL/min (A) (by C-G formula based on SCr of 1.5 mg/dL (H)). Liver Function Tests: Recent Labs  Lab 07/04/24 0317 07/05/24 0307  AST 56* 47*  ALT 45* 39  ALKPHOS 125 123  BILITOT 0.6 0.6  PROT 7.2 6.9  ALBUMIN 3.2* 3.0*   Recent Labs  Lab 07/04/24 0317  LIPASE 18   No results for input(s): AMMONIA in the last 168 hours. Coagulation Profile: No results for input(s): INR, PROTIME in the last 168 hours. Cardiac Enzymes: No results for input(s): CKTOTAL, CKMB, CKMBINDEX, TROPONINI in the last 168 hours. BNP (last 3 results) Recent Labs    05/03/24 1557 07/03/24 1620 07/04/24 0317  PROBNP 299.0 1,802.0* 1,527.0*   HbA1C: No results for input(s): HGBA1C in the last 72 hours.  CBG: Recent Labs  Lab 07/09/24 0607 07/09/24 1042 07/09/24 1558 07/09/24 2112 07/10/24 0613  GLUCAP 148* 157* 120* 134* 122*   Lipid Profile: No results for input(s): CHOL, HDL, LDLCALC, TRIG, CHOLHDL, LDLDIRECT in the last 72 hours.  Thyroid Function Tests: No results for input(s): TSH, T4TOTAL, FREET4, T3FREE, THYROIDAB in the last 72 hours.  Anemia Panel: No results for input(s): VITAMINB12, FOLATE, FERRITIN, TIBC, IRON, RETICCTPCT in the last 72 hours. Urine analysis:    Component Value Date/Time   COLORURINE YELLOW 12/19/2021 1252    APPEARANCEUR CLEAR 12/19/2021 1252   APPEARANCEUR Clear 12/05/2015 1226   LABSPEC 1.037 (H) 12/19/2021 1252   PHURINE 5.0 12/19/2021 1252   GLUCOSEU >=500 (A) 12/19/2021 1252   HGBUR NEGATIVE 12/19/2021 1252   BILIRUBINUR NEGATIVE 12/19/2021 1252   BILIRUBINUR Negative 12/05/2015 1226   KETONESUR NEGATIVE 12/19/2021 1252   PROTEINUR NEGATIVE 12/19/2021 1252   UROBILINOGEN 0.2 12/28/2013 1117  NITRITE NEGATIVE 12/19/2021 1252   LEUKOCYTESUR NEGATIVE 12/19/2021 1252   Sepsis Labs: @LABRCNTIP (procalcitonin:4,lacticidven:4)  )No results found for this or any previous visit (from the past 240 hours).   Radiology Studies: No results found.     Scheduled Meds:  aspirin  EC  81 mg Oral Daily   atorvastatin   80 mg Oral Daily   dapagliflozin  propanediol  10 mg Oral Daily   diltiazem   180 mg Oral Daily   enoxaparin  (LOVENOX ) injection  100 mg Subcutaneous Q24H   fenofibrate   160 mg Oral Daily   insulin  aspart  0-15 Units Subcutaneous TID WC   insulin  aspart  0-5 Units Subcutaneous QHS   insulin  aspart  6 Units Subcutaneous TID WC   insulin  glargine  45 Units Subcutaneous Daily   lactulose   20 g Oral BID   levothyroxine   150 mcg Oral QODAY   And   levothyroxine   175 mcg Oral QODAY   metoprolol  succinate  50 mg Oral Daily   pantoprazole   40 mg Oral QHS   sodium chloride  flush  3 mL Intravenous Q12H   torsemide   20 mg Oral Daily   Continuous Infusions:      LOS: 6 days    Time spent:    Sigurd Pac, MD [facility]   07/10/2024, 9:57 AM    "

## 2024-07-10 NOTE — Plan of Care (Signed)
  Problem: Education: Goal: Ability to describe self-care measures that may prevent or decrease complications (Diabetes Survival Skills Education) will improve Outcome: Adequate for Discharge Goal: Individualized Educational Video(s) Outcome: Adequate for Discharge   Problem: Coping: Goal: Ability to adjust to condition or change in health will improve Outcome: Adequate for Discharge   Problem: Fluid Volume: Goal: Ability to maintain a balanced intake and output will improve Outcome: Adequate for Discharge   Problem: Health Behavior/Discharge Planning: Goal: Ability to identify and utilize available resources and services will improve Outcome: Adequate for Discharge Goal: Ability to manage health-related needs will improve Outcome: Adequate for Discharge   Problem: Metabolic: Goal: Ability to maintain appropriate glucose levels will improve Outcome: Adequate for Discharge   Problem: Nutritional: Goal: Maintenance of adequate nutrition will improve Outcome: Adequate for Discharge Goal: Progress toward achieving an optimal weight will improve Outcome: Adequate for Discharge   Problem: Skin Integrity: Goal: Risk for impaired skin integrity will decrease Outcome: Adequate for Discharge   Problem: Tissue Perfusion: Goal: Adequacy of tissue perfusion will improve Outcome: Adequate for Discharge   Problem: Education: Goal: Knowledge of General Education information will improve Description: Including pain rating scale, medication(s)/side effects and non-pharmacologic comfort measures Outcome: Adequate for Discharge   Problem: Health Behavior/Discharge Planning: Goal: Ability to manage health-related needs will improve Outcome: Adequate for Discharge   Problem: Clinical Measurements: Goal: Ability to maintain clinical measurements within normal limits will improve Outcome: Adequate for Discharge Goal: Will remain free from infection Outcome: Adequate for Discharge Goal:  Diagnostic test results will improve Outcome: Adequate for Discharge Goal: Respiratory complications will improve Outcome: Adequate for Discharge Goal: Cardiovascular complication will be avoided Outcome: Adequate for Discharge   Problem: Activity: Goal: Risk for activity intolerance will decrease Outcome: Adequate for Discharge   Problem: Nutrition: Goal: Adequate nutrition will be maintained Outcome: Adequate for Discharge   Problem: Coping: Goal: Level of anxiety will decrease Outcome: Adequate for Discharge   Problem: Elimination: Goal: Will not experience complications related to bowel motility Outcome: Adequate for Discharge Goal: Will not experience complications related to urinary retention Outcome: Adequate for Discharge   Problem: Pain Managment: Goal: General experience of comfort will improve and/or be controlled Outcome: Adequate for Discharge   Problem: Safety: Goal: Ability to remain free from injury will improve Outcome: Adequate for Discharge   Problem: Skin Integrity: Goal: Risk for impaired skin integrity will decrease Outcome: Adequate for Discharge   Problem: Education: Goal: Understanding of CV disease, CV risk reduction, and recovery process will improve Outcome: Adequate for Discharge Goal: Individualized Educational Video(s) Outcome: Adequate for Discharge   Problem: Activity: Goal: Ability to return to baseline activity level will improve Outcome: Adequate for Discharge   Problem: Cardiovascular: Goal: Ability to achieve and maintain adequate cardiovascular perfusion will improve Outcome: Adequate for Discharge Goal: Vascular access site(s) Level 0-1 will be maintained Outcome: Adequate for Discharge   Problem: Health Behavior/Discharge Planning: Goal: Ability to safely manage health-related needs after discharge will improve Outcome: Adequate for Discharge

## 2024-07-10 NOTE — TOC Progression Note (Signed)
 Transition of Care [facility]) - Progression Note    Patient Details  Name: ZIDAN HELGET MRN: [id number] Date of Birth: Jan 08, 1961  [facility] CM/SW Contact  Waddell Barnie Rama, RN Phone Number: 07/10/2024, 11:09 AM  Clinical Narrative:    NCM called [facility] and left vm for Cesar Betters.  Awaiting call back.   Expected Discharge Plan: Home w Home Health Services Barriers to Discharge: Continued Medical Work up               Expected Discharge Plan and Services In-house Referral: NA Discharge Planning Services: CM Consult Post Acute Care Choice: Home Health Living arrangements for the past 2 months: Apartment                                       Social Drivers of Health (SDOH) Interventions SDOH Screenings   Food Insecurity: No Food Insecurity (07/04/2024)  Housing: Unknown (07/05/2024)  Transportation Needs: No Transportation Needs (07/04/2024)  Utilities: Not At Risk (07/04/2024)  Alcohol Screen: Low Risk (07/05/2024)  Financial Resource Strain: Low Risk (07/05/2024)  Social Connections: Unknown (07/04/2024)  Tobacco Use: Medium Risk (07/03/2024)    Readmission Risk Interventions     No data to display

## 2024-07-10 NOTE — Progress Notes (Signed)
 Occupational Therapy Treatment Patient Details Name: Ronnie Hernandez MRN: [id number] DOB: 12/20/1960 Today's Date: 07/10/2024   History of present illness Pt is a 64 y.o. M presenting [facility] on 07/03/24 with SOB, admitted for acute on chronic diastolic HF. PMH is significant for T2DM, HF, HLD, and hypothyroidism.   OT comments  Patient received in supine and eager to participate with OT/PT. Patient requiring max assist +2 for supine to sitting on EOB and max assist to donn shoes. Patient stood from EOB with min assist +2 and was max assist for peri area bathing. Patient attempting to ambulate towards sink but required recliner placed behind him due to fatigue. Patient stood at sink with min assist +2 for 2-3 stands with patient tolerating ~1 minute of standing. Patient asking to attempt standing from recliner to RW and was min to CGA +2 to perform and patient able to ambulate after 2nd stand. Discharge recommendations changed from SNF to home with HHOT due to able to get [facility] aide to assist.  Acute OT to continue to follow to address established goals.       If plan is discharge home, recommend the following:  A lot of help with walking and/or transfers;A lot of help with bathing/dressing/bathroom;Assistance with feeding;Assistance with cooking/housework;Assist for transportation;Help with stairs or ramp for entrance   Equipment Recommendations  None recommended by OT    Recommendations for Other Services      Precautions / Restrictions Precautions Precautions: Fall Recall of Precautions/Restrictions: Intact Restrictions Weight Bearing Restrictions Per Provider Order: No       Mobility Bed Mobility Overal bed mobility: Needs Assistance Bed Mobility: Supine to Sit     Supine to sit: Max assist, +2 for physical assistance     General bed mobility comments: assist to bring BLE to and off EOB and to eleavte trunk, pt able to shift trunk over feet to correct posterior lean     Transfers Overall transfer level: Needs assistance Equipment used: Rolling walker (2 wheels) (bari walker, sink for support) Transfers: Sit to/from Stand, Bed to chair/wheelchair/BSC Sit to Stand: +2 physical assistance, Min assist, Contact guard assist     Step pivot transfers: Max assist, +2 physical assistance     General transfer comment: sit to stands vary from min assist +2 to CGA.  Transfer from EOB to recliner with max assist +2 due with chair pulled behind patient due to fatigue     Balance Overall balance assessment: Needs assistance Sitting-balance support: Feet supported, No upper extremity supported Sitting balance-Leahy Scale: Good Sitting balance - Comments: seated EOB without BUE support   Standing balance support: Bilateral upper extremity supported, During functional activity, Reliant on assistive device for balance Standing balance-Leahy Scale: Poor Standing balance comment: reliant on external support for balance                           ADL either performed or assessed with clinical judgement   ADL Overall ADL's : Needs assistance/impaired     Grooming: Set up;Sitting               Lower Body Dressing: Maximal assistance;Sitting/lateral leans Lower Body Dressing Details (indicate cue type and reason): to donn shoes               General ADL Comments: has caregiver that assists patient with toilet hygiene and dressing    Extremity/Trunk Assessment  Vision       Perception     Praxis     Communication Communication Communication: No apparent difficulties   Cognition Arousal: Alert Behavior During Therapy: WFL for tasks assessed/performed                                 Following commands: Intact        Cueing   Cueing Techniques: Verbal cues, Gestural cues  Exercises      Shoulder Instructions       General Comments VSS on RA    Pertinent Vitals/ Pain       Pain  Assessment Pain Assessment: No/denies pain  Home Living                                          Prior Functioning/Environment              Frequency  Min 2X/week        Progress Toward Goals  OT Goals(current goals can now be found in the care plan section)  Progress towards OT goals: Progressing toward goals  Acute Rehab OT Goals Patient Stated Goal: to walk more OT Goal Formulation: With patient Time For Goal Achievement: 07/19/24 Potential to Achieve Goals: Good ADL Goals Pt Will Perform Upper Body Dressing: with set-up;sitting Pt Will Perform Lower Body Dressing: with mod assist;sit to/from stand Pt Will Transfer to Toilet: with min assist;stand pivot transfer;bedside commode Additional ADL Goal #1: Pt will stand for at least 3 minutes with CGA as a precursor to OOB ADLs  Plan      Co-evaluation    PT/OT/SLP Co-Evaluation/Treatment: Yes Reason for Co-Treatment: For patient/therapist safety;To address functional/ADL transfers   OT goals addressed during session: ADL's and self-care      AM-PAC OT 6 Clicks Daily Activity     Outcome Measure   Help from another person eating meals?: A Little Help from another person taking care of personal grooming?: A Little Help from another person toileting, which includes using toliet, bedpan, or urinal?: Total Help from another person bathing (including washing, rinsing, drying)?: A Lot Help from another person to put on and taking off regular upper body clothing?: A Little Help from another person to put on and taking off regular lower body clothing?: Total 6 Click Score: 13    End of Session Equipment Utilized During Treatment: Rolling walker (2 wheels) (bariatric RW)  OT Visit Diagnosis: Unsteadiness on feet (R26.81);Other abnormalities of gait and mobility (R26.89);Muscle weakness (generalized) (M62.81)   Activity Tolerance Patient tolerated treatment well   Patient Left in chair;with call  bell/phone within reach;with chair alarm set   Nurse Communication Mobility status;Need for lift equipment        Time: 9081-9043 OT Time Calculation (min): 38 min  Charges: OT General Charges $OT Visit: 1 Visit OT Treatments $Self Care/Home Management : 8-22 mins  Dick Laine, OTA Acute Rehabilitation Services  Office [phone]   Jeb LITTIE Laine 07/10/2024, 1:32 PM

## 2024-07-10 NOTE — Plan of Care (Signed)
  Problem: Education: Goal: Knowledge of General Education information will improve Description: Including pain rating scale, medication(s)/side effects and non-pharmacologic comfort measures Outcome: Progressing   Problem: Clinical Measurements: Goal: Respiratory complications will improve Outcome: Progressing   Problem: Clinical Measurements: Goal: Cardiovascular complication will be avoided Outcome: Progressing   

## 2024-07-10 NOTE — Progress Notes (Signed)
 Physical Therapy Treatment Patient Details Name: Ronnie Hernandez MRN: [id number] DOB: 11-21-1960 Today's Date: 07/10/2024   History of Present Illness Pt is a 64 y.o. M presenting to [facility] on 07/03/24 with SOB, admitted for acute on chronic diastolic HF. PMH is significant for T2DM, HF, HLD, and hypothyroidism.    PT Comments  Pt resting in bed on arrival, pleasant and motivated for session and demonstrating continued progress towards acute goals, however pt continues to require +2 assist for all aspects of mobility. Pt seen in conjunction with OT to maximize pt activity tolerance and safety with progression of OOB mobility. Pt requiring max A +2 to complete bed mobility, min A +2 to complete transfers sit<>stand and min A +2 to ambulate a very short non-functional distance with RW for support and close chair follow for safety. Pt very motivated for improvement and will benefit from continued inpatient follow up therapy, <3 hours/day. Will continue to follow acutely.    If plan is discharge home, recommend the following: A lot of help with bathing/dressing/bathroom;Assist for transportation;Two people to help with walking and/or transfers   Can travel by private vehicle     No  Equipment Recommendations  Hoyer lift    Recommendations for Other Services       Precautions / Restrictions Precautions Precautions: Fall Recall of Precautions/Restrictions: Intact Restrictions Weight Bearing Restrictions Per Provider Order: No     Mobility  Bed Mobility Overal bed mobility: Needs Assistance Bed Mobility: Supine to Sit     Supine to sit: Max assist, +2 for physical assistance     General bed mobility comments: assist to bring BLE to and off EOB and to eleavte trunk, pt able to shift trunk over feet to correct posterior lean    Transfers Overall transfer level: Needs assistance Equipment used: Rolling walker (2 wheels) (bari, sink support) Transfers: Sit to/from Stand, Bed to  chair/wheelchair/BSC Sit to Stand: +2 physical assistance, Min assist, Contact guard assist   Step pivot transfers: Max assist, +2 physical assistance       General transfer comment: pt standing from EOB and recliner chair with varrying levels of assist from min A+2 to CGA +2, pt with good use of momentum to shift weight anterior and power up, poor posture in standing with pt demosntrating stong anteior flexion, pt with poor ability to grip RW handle on R, max A +2 to step around to chair with PT/OT needing to pull chair up behind pt due to quick fatigue    Ambulation/Gait Ambulation/Gait assistance: Min assist, +2 physical assistance (with chair follow) Gait Distance (Feet): 8 Feet Assistive device: Rolling walker (2 wheels) Gait Pattern/deviations: Step-to pattern, Wide base of support, Trunk flexed Gait velocity: decr     General Gait Details: pt ambulting with very flexed trunk and very wide BOS with feet outsdie bari RW. chair follow for safety   Stairs             Wheelchair Mobility     Tilt Bed    Modified Rankin (Stroke Patients Only)       Balance Overall balance assessment: Needs assistance Sitting-balance support: Feet supported, No upper extremity supported Sitting balance-Leahy Scale: Good Sitting balance - Comments: seated EOB without BUE support   Standing balance support: Bilateral upper extremity supported, During functional activity, Reliant on assistive device for balance Standing balance-Leahy Scale: Poor Standing balance comment: reliant on external support for balance  Communication Communication Communication: No apparent difficulties  Cognition Arousal: Alert Behavior During Therapy: WFL for tasks assessed/performed   PT - Cognitive impairments: No apparent impairments                         Following commands: Intact      Cueing Cueing Techniques: Verbal cues, Gestural cues   Exercises      General Comments General comments (skin integrity, edema, etc.): VSS on RA      Pertinent Vitals/Pain Pain Assessment Pain Assessment: No/denies pain    Home Living                          Prior Function            PT Goals (current goals can now be found in the care plan section) Acute Rehab PT Goals Patient Stated Goal: to get stronger PT Goal Formulation: With patient Time For Goal Achievement: 07/19/24 Progress towards PT goals: Progressing toward goals    Frequency    Min 2X/week      PT Plan      Co-evaluation              AM-PAC PT 6 Clicks Mobility   Outcome Measure  Help needed turning from your back to your side while in a flat bed without using bedrails?: Total Help needed moving from lying on your back to sitting on the side of a flat bed without using bedrails?: Total Help needed moving to and from a bed to a chair (including a wheelchair)?: Total Help needed standing up from a chair using your arms (e.g., wheelchair or bedside chair)?: A Little Help needed to walk in hospital room?: Total (<20') Help needed climbing 3-5 steps with a railing? : Total 6 Click Score: 8    End of Session Equipment Utilized During Treatment: Other (comment) (pt orthopedic shoes, lift pad placed in chair) Activity Tolerance: Patient tolerated treatment well Patient left: with call bell/phone within reach;in chair;with chair alarm set;with nursing/sitter in room Nurse Communication: Mobility status;Need for lift equipment (lift pad placed under pt to return to bed) PT Visit Diagnosis: Unsteadiness on feet (R26.81);Other abnormalities of gait and mobility (R26.89);Muscle weakness (generalized) (M62.81)     Time: 9081-9043 PT Time Calculation (min) (ACUTE ONLY): 38 min  Charges:    $Gait Training: 23-37 mins PT General Charges $$ ACUTE PT VISIT: 1 Visit                     Auston Halfmann R. PTA Acute Rehabilitation Services Office:  [phone]   Therisa CHRISTELLA Boor 07/10/2024, 10:48 AM

## 2024-07-10 NOTE — Progress Notes (Signed)
 Heart Failure Navigator Progress Note  Assessed for Heart & Vascular TOC clinic readiness.   Patient initially had HF TOC appt scheduled for 1/21. Since he remains in the hospital, appt was cancelled. Due to mobility issues, does not meet criteria per Dr. Fairy. Will not reschedule HF TOC appt at this time.   Navigator available for reassessment of patient.   Duwaine Plant, PharmD, BCPS Heart Failure Stewardship Pharmacist Phone [phone]

## 2024-07-10 NOTE — TOC Transition Note (Addendum)
 Transition of Care [facility]) - Discharge Note   Patient Details  Name: Ronnie Hernandez MRN: [id number] Date of Birth: 16-Nov-1960  Transition of Care [facility]) CM/SW Contact:  Waddell Barnie Rama, RN Phone Number: 07/10/2024, 2:46 PM   Clinical Narrative:    For dc today, PTAR has been scheduled, per Gatha with [facility] said we could use PTAR to transport at dc,  [facility] will be evaluating patient to see if they can increase his aide hours.  His aide will be at his home at 4 pm per Gatha with [facility] and then she willl be back in the am.  Patient states his aide usually helps him get ready in the am to go to [place] .  NCM faxed scripts and dc summary to [facility].     Barriers to Discharge: Continued Medical Work up   Patient Goals and CMS Choice Patient states their goals for this hospitalization and ongoing recovery are:: Return home, continue receiving [facility] services          Discharge Placement                       Discharge Plan and Services Additional resources added to the After Visit Summary for   In-house Referral: NA Discharge Planning Services: CM Consult Post Acute Care Choice: Home Health                               Social Drivers of Health (SDOH) Interventions SDOH Screenings   Food Insecurity: No Food Insecurity (07/04/2024)  Housing: Unknown (07/05/2024)  Transportation Needs: No Transportation Needs (07/04/2024)  Utilities: Not At Risk (07/04/2024)  Alcohol Screen: Low Risk (07/05/2024)  Financial Resource Strain: Low Risk (07/05/2024)  Social Connections: Unknown (07/04/2024)  Tobacco Use: Medium Risk (07/03/2024)     Readmission Risk Interventions     No data to display

## 2024-07-11 ENCOUNTER — Ambulatory Visit (HOSPITAL_COMMUNITY): Payer: Medicare (Managed Care)

## 2024-07-18 ENCOUNTER — Ambulatory Visit (HOSPITAL_COMMUNITY): Payer: Medicare (Managed Care)

## 2024-07-24 ENCOUNTER — Telehealth (HOSPITAL_COMMUNITY): Payer: Self-pay

## 2024-07-25 ENCOUNTER — Ambulatory Visit (HOSPITAL_COMMUNITY): Payer: Medicare (Managed Care)

## 2024-08-13 ENCOUNTER — Ambulatory Visit (HOSPITAL_COMMUNITY): Payer: Medicare (Managed Care)

## 2024-08-14 ENCOUNTER — Ambulatory Visit: Payer: Medicare (Managed Care) | Admitting: Cardiology
